# Patient Record
Sex: Male | Born: 1955 | Race: White | Hispanic: No | Marital: Married | Smoking: Never smoker
Health system: Southern US, Community
[De-identification: ages and names within clinical notes are randomized; demographics above are authoritative.]

## PROBLEM LIST (undated history)

## (undated) DIAGNOSIS — F329 Major depressive disorder, single episode, unspecified: Secondary | ICD-10-CM

## (undated) DIAGNOSIS — E1142 Type 2 diabetes mellitus with diabetic polyneuropathy: Secondary | ICD-10-CM

## (undated) DIAGNOSIS — R296 Repeated falls: Secondary | ICD-10-CM

## (undated) DIAGNOSIS — Z9989 Dependence on other enabling machines and devices: Secondary | ICD-10-CM

## (undated) DIAGNOSIS — I8310 Varicose veins of unspecified lower extremity with inflammation: Secondary | ICD-10-CM

## (undated) DIAGNOSIS — Z973 Presence of spectacles and contact lenses: Secondary | ICD-10-CM

## (undated) DIAGNOSIS — E119 Type 2 diabetes mellitus without complications: Secondary | ICD-10-CM

## (undated) DIAGNOSIS — R609 Edema, unspecified: Secondary | ICD-10-CM

## (undated) DIAGNOSIS — R0602 Shortness of breath: Secondary | ICD-10-CM

## (undated) DIAGNOSIS — Z86711 Personal history of pulmonary embolism: Secondary | ICD-10-CM

## (undated) DIAGNOSIS — I878 Other specified disorders of veins: Secondary | ICD-10-CM

## (undated) DIAGNOSIS — G4730 Sleep apnea, unspecified: Secondary | ICD-10-CM

## (undated) DIAGNOSIS — I10 Essential (primary) hypertension: Secondary | ICD-10-CM

## (undated) DIAGNOSIS — G4733 Obstructive sleep apnea (adult) (pediatric): Secondary | ICD-10-CM

## (undated) DIAGNOSIS — F411 Generalized anxiety disorder: Secondary | ICD-10-CM

## (undated) DIAGNOSIS — I82409 Acute embolism and thrombosis of unspecified deep veins of unspecified lower extremity: Secondary | ICD-10-CM

## (undated) DIAGNOSIS — E1149 Type 2 diabetes mellitus with other diabetic neurological complication: Secondary | ICD-10-CM

## (undated) DIAGNOSIS — E7800 Pure hypercholesterolemia, unspecified: Secondary | ICD-10-CM

## (undated) DIAGNOSIS — Z862 Personal history of diseases of the blood and blood-forming organs and certain disorders involving the immune mechanism: Secondary | ICD-10-CM

## (undated) DIAGNOSIS — I739 Peripheral vascular disease, unspecified: Secondary | ICD-10-CM

## (undated) DIAGNOSIS — T8149XA Infection following a procedure, other surgical site, initial encounter: Secondary | ICD-10-CM

## (undated) DIAGNOSIS — E1165 Type 2 diabetes mellitus with hyperglycemia: Secondary | ICD-10-CM

## (undated) DIAGNOSIS — F32A Depression, unspecified: Secondary | ICD-10-CM

## (undated) DIAGNOSIS — K219 Gastro-esophageal reflux disease without esophagitis: Secondary | ICD-10-CM

## (undated) DIAGNOSIS — IMO0001 Reserved for inherently not codable concepts without codable children: Secondary | ICD-10-CM

## (undated) HISTORY — DX: Peripheral vascular disease, unspecified: I73.9

## (undated) HISTORY — DX: Reserved for inherently not codable concepts without codable children: IMO0001

## (undated) HISTORY — DX: Type 2 diabetes mellitus without complications: E11.9

## (undated) HISTORY — DX: Generalized anxiety disorder: F41.1

## (undated) HISTORY — DX: Essential (primary) hypertension: I10

## (undated) HISTORY — PX: OTHER SURGICAL HISTORY: SHX169

## (undated) HISTORY — DX: Obstructive sleep apnea (adult) (pediatric): G47.33

## (undated) HISTORY — DX: Personal history of pulmonary embolism: Z86.711

## (undated) HISTORY — DX: Edema, unspecified: R60.9

## (undated) HISTORY — DX: Type 2 diabetes mellitus with other diabetic neurological complication: E11.49

## (undated) HISTORY — DX: Depression, unspecified: F32.A

## (undated) HISTORY — PX: NO PAST SURGERIES: SHX2092

## (undated) HISTORY — DX: Morbid (severe) obesity due to excess calories: E66.01

## (undated) HISTORY — DX: Type 2 diabetes mellitus with diabetic polyneuropathy: E11.42

## (undated) HISTORY — DX: Gastro-esophageal reflux disease without esophagitis: K21.9

## (undated) HISTORY — DX: Pure hypercholesterolemia, unspecified: E78.00

## (undated) HISTORY — DX: Type 2 diabetes mellitus with hyperglycemia: E11.65

## (undated) HISTORY — DX: Sleep apnea, unspecified: G47.30

## (undated) HISTORY — DX: Varicose veins of unspecified lower extremity with inflammation: I83.10

## (undated) HISTORY — DX: Major depressive disorder, single episode, unspecified: F32.9

---

## 2001-12-25 ENCOUNTER — Ambulatory Visit (HOSPITAL_BASED_OUTPATIENT_CLINIC_OR_DEPARTMENT_OTHER): Admission: RE | Admit: 2001-12-25 | Discharge: 2001-12-25 | Payer: Self-pay | Admitting: *Deleted

## 2002-09-27 DIAGNOSIS — Z86711 Personal history of pulmonary embolism: Secondary | ICD-10-CM

## 2002-09-27 DIAGNOSIS — I82409 Acute embolism and thrombosis of unspecified deep veins of unspecified lower extremity: Secondary | ICD-10-CM

## 2002-09-27 HISTORY — DX: Personal history of pulmonary embolism: Z86.711

## 2002-09-27 HISTORY — DX: Acute embolism and thrombosis of unspecified deep veins of unspecified lower extremity: I82.409

## 2002-12-10 ENCOUNTER — Encounter: Payer: Self-pay | Admitting: *Deleted

## 2002-12-10 ENCOUNTER — Ambulatory Visit (HOSPITAL_COMMUNITY): Admission: RE | Admit: 2002-12-10 | Discharge: 2002-12-10 | Payer: Self-pay | Admitting: *Deleted

## 2003-01-02 ENCOUNTER — Encounter: Payer: Self-pay | Admitting: Cardiology

## 2003-01-02 ENCOUNTER — Inpatient Hospital Stay (HOSPITAL_COMMUNITY): Admission: EM | Admit: 2003-01-02 | Discharge: 2003-01-10 | Payer: Self-pay | Admitting: Emergency Medicine

## 2003-01-03 ENCOUNTER — Encounter (INDEPENDENT_AMBULATORY_CARE_PROVIDER_SITE_OTHER): Payer: Self-pay | Admitting: Cardiology

## 2008-02-18 ENCOUNTER — Ambulatory Visit: Payer: Self-pay | Admitting: Nephrology

## 2008-02-18 ENCOUNTER — Inpatient Hospital Stay (HOSPITAL_COMMUNITY): Admission: EM | Admit: 2008-02-18 | Discharge: 2008-02-20 | Payer: Self-pay | Admitting: Emergency Medicine

## 2008-07-04 ENCOUNTER — Other Ambulatory Visit: Payer: Self-pay | Admitting: Emergency Medicine

## 2008-07-05 ENCOUNTER — Other Ambulatory Visit: Payer: Self-pay | Admitting: Emergency Medicine

## 2008-07-05 ENCOUNTER — Inpatient Hospital Stay (HOSPITAL_COMMUNITY): Admission: EM | Admit: 2008-07-05 | Discharge: 2008-07-07 | Payer: Self-pay | Admitting: Internal Medicine

## 2009-09-29 ENCOUNTER — Emergency Department (HOSPITAL_COMMUNITY): Admission: EM | Admit: 2009-09-29 | Discharge: 2009-09-29 | Payer: Self-pay | Admitting: Emergency Medicine

## 2010-12-13 LAB — DIFFERENTIAL
Basophils Absolute: 0.1 10*3/uL (ref 0.0–0.1)
Basophils Relative: 1 % (ref 0–1)
Eosinophils Absolute: 0.2 10*3/uL (ref 0.0–0.7)
Eosinophils Relative: 3 % (ref 0–5)
Lymphocytes Relative: 27 % (ref 12–46)
Lymphs Abs: 2.2 10*3/uL (ref 0.7–4.0)
Monocytes Absolute: 0.5 10*3/uL (ref 0.1–1.0)
Monocytes Relative: 6 % (ref 3–12)
Neutro Abs: 5.1 10*3/uL (ref 1.7–7.7)
Neutrophils Relative %: 63 % (ref 43–77)

## 2010-12-13 LAB — POCT I-STAT, CHEM 8
BUN: 25 mg/dL — ABNORMAL HIGH (ref 6–23)
Calcium, Ion: 1.09 mmol/L — ABNORMAL LOW (ref 1.12–1.32)
Chloride: 105 mEq/L (ref 96–112)
Creatinine, Ser: 1 mg/dL (ref 0.4–1.5)
Glucose, Bld: 153 mg/dL — ABNORMAL HIGH (ref 70–99)
HCT: 46 % (ref 39.0–52.0)
Hemoglobin: 15.6 g/dL (ref 13.0–17.0)
Potassium: 4.6 mEq/L (ref 3.5–5.1)
Sodium: 138 meq/L (ref 135–145)
TCO2: 30 mmol/L (ref 0–100)

## 2010-12-13 LAB — CBC
HCT: 42.5 % (ref 39.0–52.0)
Hemoglobin: 14.2 g/dL (ref 13.0–17.0)
MCHC: 33.4 g/dL (ref 30.0–36.0)
MCV: 85.6 fL (ref 78.0–100.0)
Platelets: 301 10*3/uL (ref 150–400)
RBC: 4.96 MIL/uL (ref 4.22–5.81)
RDW: 14.4 % (ref 11.5–15.5)
WBC: 8.2 10*3/uL (ref 4.0–10.5)

## 2011-02-09 NOTE — H&P (Signed)
NAME:  Joseph [name], Joseph [name] NO.:  [id number]   MEDICAL RECORD NO.:  [id number]          PATIENT TYPE:  INP   LOCATION:  3036                         FACILITY:  [facility]   PHYSICIAN:  Farris Has, MDDATE OF BIRTH:  May 15, 1956   DATE OF ADMISSION:  07/05/2008  DATE OF DISCHARGE:                              HISTORY & PHYSICAL   PRIMARY CARE Seleni Meller:  Dr. Donald Prose, [place].   CHIEF COMPLAINT:  Swelling of lower abdomen, redness, low-grade fever.   The patient is a 55 year old morbidly obese gentleman with history of  diabetes, hyperlipidemia and recurrent panniculitis, who comes in today  with history of low-grade fever up to 100.8 at home, recent mild cough,  swelling of his pannus with some redness and warmth to it, which made  him concerned that he has a potential panniculitis recurrent, as well as  mild diarrhea.   Otherwise, review of systems unremarkable.  No chest pain, no shortness  of breath.  He has been taking all his medications as prescribed.  No  nausea or vomiting.  He had maybe one loose stool, but otherwise  unremarkable.  Review of systems otherwise negative.   PAST MEDICAL HISTORY:  1. Recurrent panniculitis  2. Diabetes.  3. Hypertension.  4. Hyperlipidemia.  5. 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  6. 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  7. Chronic anasarca.  8. 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   FAMILY HISTORY:  History of a CVA in the family.   SOCIAL HISTORY:  The patient lives in [place], married, has one child  and one granddaughter who lives with them. Does not drink alcohol or use  drugs, never smoked.   ALLERGIES:  NO KNOWN DRUG ALLERGIES.   MEDICATIONS:  1. Humalog sliding scale.  2. Lantus 60 units subcu at night.  3. Metformin 1 gm p.o. b.i.d.  4. Actos 45 mg once a day.  5. Coumadin 10 mg on Mondays and Fridays and 7.5 on all other days.  6.  Quinapril 20 mg 2 tablets in a.m. and one at night.  7. Indapamide 2.5 mg in the morning.  8. Amlodipine 5 mg p.o. daily.  9. Pravastatin 40 mg p.o. daily.   PHYSICAL EXAMINATION:  VITAL SIGNS:  Temperature 98.2, blood pressure  166/57, pulse 98, respirations 20.  Sating 95% on room air.  GENERAL:  The patient is an obese male in no acute distress, sitting  down in a chair.  HEAD:  Nontraumatic.  NECK:  Obese but supple.  No lymphadenopathy noted.  ABDOMEN:  Severely obese with a very large pannus with lymphedema  present, some redness and warmth present as well.  LOWER EXTREMITIES:  Without clubbing, cyanosis or edema.  HEART:  Regular rate and rhythm.  No murmurs, rubs or gallops.  Breath  sounds very distant.  Heart sounds very distant as well. exam is  difficult secondary to morbid obesity.  NEUROLOGICAL:  Able to move about.  Strength 5/5 in all four  extremities.  Cranial nerves  II-XII intact.   LABORATORY DATA:  White blood cell count elevated at 11.2.  Hemoglobin  13.3, sodium 138, potassium 4.4, creatinine 1.4, which is around his  baseline, maybe a little bit up.  INR 3.2.  Otherwise, no imaging was  obtained.   ASSESSMENT/PLAN:  This is a 55 year old gentleman with recurrent  panniculitis. low grade fever and white blood cell count elevation  secondary to panniculitis as well.   1. Panniculitis.  Will as the last time try vancomycin and see if he      has some improvement.  Will follow white blood cell count and      follow him clinically.  2. Mild cough.  Will check a chest x-ray as this patient does have      elevated white blood cell count and mild fever.   We will also check a urinalysis and urine cultures to rule out that as a  source of infection as well.   1. History of pulmonary embolus.  Will continue Coumadin, but hold      tonight's dose as the patient is supratherapeutic.  The goal per      patient is 2.5-3.  2. Hypertension.  Continue home medications,  except for indapamide.      The patient has a relatively slightly elevated creatinine and will      give gentle hydration.  3. Diabetes.  Will do sliding scale insulin, Actos and home      medications.  Except for holding metformin while hospitalized.  4. Creatinine 1.4, unsure if this is actually of any significance.      Will give gentle hydration and see if there is a change in      medications.  This could be normal for this gentleman.  5. Prophylaxis.  Protonix and Coumadin.      Farris Has, MD  Electronically Signed     AVD/MEDQ  D:  07/05/2008  T:  07/05/2008  Job:  [id number]   cc:   Donald Prose, M.D.  Fax: [phone]

## 2011-02-09 NOTE — H&P (Signed)
NAME:  Joseph [name], DIVEN.:  [id number]   MEDICAL RECORD NO.:  [id number]          PATIENT TYPE:  INP   LOCATION:  0103                         [facility]AC[facility]   PHYSICIAN:  Bartholomew Boards, MD      DATE OF BIRTH:  [birth year]   DATE OF ADMISSION:  02/18/2008  DATE OF DISCHARGE:                              HISTORY & PHYSICAL   ADMITTING SERVICE:  Lorina Rabon, [facility].   PRIMARY CARE PHYSICIAN:  Dr. [facility].   CHIEF COMPLAINT:  Fever and chills and skin irritation on the abdomen.   HISTORY OF PRESENT ILLNESS:  This is a 55 year old white male with  history of morbid obesity, diabetes requiring insulin, hypertension, and  recurrent panniculitis who presents with less than 1 day history of  fever, chills, nausea, malaise, and abdominal wall pain. The patient  reports that he awoke this morning feeling fatigued and malaised. He  developed nausea, fever and chills at mid-day and while resting had  shaking chills and rigors, so he presented to the emergency department  as these were accompanied by abdominal wall pain. The patient reports  that he has had these symptoms before during prior episodes of  panniculitis which is infection of the skin of his penis. He reports he  had been treated several times, most recently 1 month ago, with 2  unknown antibiotic injections in the muscle and an oral antibiotic that  he cannot remember as well, possibly Augmentin. He reports that this  resolved with no problem; however, this time he feels much worse than he  has in the past. He denies productive cough, diarrhea, frank abdominal  pain anteriorly, dysuria or hematuria. He does report that he gets short  of breath with exertion which he believes is pretty stable and due to  his weight. He denies chest pain, extremity pain, or new swelling in any  extremity.   REVIEW OF SYSTEMS:  As per his history of present illness. A 14-point  review of systems  was obtained and negative.   PAST MEDICAL HISTORY:  1. Diabetes mellitus requiring insulin.  2. Hypertension.  3. Hyperlipidemia.  4. History of pulmonary embolism and deep venous thrombosis on chronic      Coumadin therapy.  5. Recurrent panniculitis.  6. Chronic venous stasis dermatitis on bilateral lower extremities.   FAMILY HISTORY:  Positive for cerebrovascular accident, negative for  diabetes.   SOCIAL HISTORY:  The patient lives in [place], [place]. He is  unemployed currently. He is married and has one child. Denies tobacco,  alcohol, or drug use.   ALLERGIES:  No known drug allergies.   MEDICATIONS:  1. Humalog sliding-scale.  2. Lantus 60 units, subcu at night.  3. Metformin 1 gram b.i.d.  4. Actos once a day.  5. Coumadin 10 mg on Mondays and Wednesdays and 7.5 mg on the other      days.  6. Amantadine, unknown dose.  7. Pravastatin, unknown dose.  8. Quinapril, unknown dose.   PHYSICAL EXAMINATION:  VITAL SIGNS:  Blood pressure is  109/74.  Temperature 101.9 degrees Fahrenheit. Pulse 110, respirations 20,  satting 96% on room air.  GENERAL:  Morbidly obese white male in no acute distress.  HEENT:  The patient wears glasses. Extraocular movements are intact.  Pupils equally round and reactive to light. Oropharynx is clear.  NECK:  Supple without lymphadenopathy or jugular venous distention.  CARDIOVASCULAR:  Regular rate and rhythm without murmur, rub, or gallop.  PULMONARY:  Clear to auscultation without wheezes, rales, or rhonchi.  ABDOMEN:  Morbidly obese with chronic skin changes due to skin tension  of his extremely large pannus. He has mild erythema appearing as pink  erythema. It is approximately 2 to 2-1/2 feet wide and 1-1/2 feet high  and approximately football-shaped overlying the terminal margin of his  pannus. There does not appear to be any skin breakdown or boils or other  lesions. There does not appear to be any breakdown in the  intertriginous  regions anywhere else along the pannus.  EXTREMITIES:  Bilateral chronic venous stasis dermatitis of the lower  extremities extending to the upper shins with 1+ edema on the bilateral  shins.  SKIN:  See above exam descriptions.  NEUROLOGIC:  Intact.   LABS AND X-RAYS:  White blood count elevated at 19. Urinalysis was  normal. Basic metabolic panel showed elevated glucose at 171; otherwise  was within normal limits. PT/INR was elevated with INR of 3.8.   IMPRESSION AND PLAN:  This is an insulin-dependent diabetic male with  morbid obesity and history of hypertension and deep venous thrombosis on  chronic Coumadin as well as recurrent panniculitis who presents with  recurrent panniculitis complicated by systemic inflammatory response  syndrome as marked by borderline hypotension, tachycardia, and fever.  Plan of action: We will admit the patient to [facility], will start on IV vancomycin with a pharmacy consult to dose  vancomycin in this morbidly obese gentleman, and blood cultures have  been drawn in the emergency department and are pending. We will continue  sliding-scale insulin and Lantus for diabetic control. We will hold oral  hypoglycemics.  For his history of hypertension, he is borderline  hypotensive today so we will hold his anti-hypertensive medicines. For  his history of deep venous thrombosis and chronic anticoagulation, he is  a little supratherapeutic so we will start him on 7.5 mg of Coumadin  daily throughout his hospital stay and check daily INRs.      Bartholomew Boards, MD  Electronically Signed     EWR/MEDQ  D:  02/18/2008  T:  02/18/2008  Job:  [id number]

## 2011-02-09 NOTE — Discharge Summary (Signed)
NAME:  Joseph, [name] NO.:  [id number]   MEDICAL RECORD NO.:  [id number]          PATIENT TYPE:  INP   LOCATION:  3036                         FACILITY:  [facility]   PHYSICIAN:  Corinna L. Conley Canal, MDDATE OF BIRTH:  October 11, 1955   DATE OF ADMISSION:  07/05/2008  DATE OF DISCHARGE:  07/07/2008                               DISCHARGE SUMMARY   DISCHARGE DIAGNOSES:  1. Panniculitis.  2. Moderate obesity.  3. Diabetes.  4. Obstructive sleep apnea.  5. Hyperlipidemia.  6. Hypertension.  7. History of pulmonary embolus and deep venous thrombosis, on chronic      Coumadin.  8. Chronic venous stasis of the legs.   DISCHARGE MEDICATIONS:  Doxycycline 100 mg p.o. b.i.d. until gone,  Augmentin 875 mg p.o. b.i.d. until gone, and decrease his Coumadin to 5  mg nightly until followup with Dr. Nancy Fetter.  He may resume the rest of his  medications which include,  1. Metformin 1000 mg p.o. b.i.d.  2. Actos 45 mg a day.  3. Quinapril 40 mg in the morning and 20 mg in the evening.  4. Indapamide 5 mg daily.  5. Amlodipine 5 mg a day.  6. Pravastatin 40 mg a day.  7. Lantus 60 units subcutaneously nightly.  8. Humalog sliding scale.   CONDITION:  Stable.   ACTIVITY:  He may return to work on Tuesday.   FOLLOWUP:  Follow up with Dr. Donald Prose in a week.  Please check INR.   CONSULTATIONS:  None.   PROCEDURES:  None.   LABORATORY DATA:  White blood cell count on admission was 11,000 with  79% neutrophils, otherwise unremarkable.  His white count normalized.  On admission, INR was 3.2 and peaked at 4.0 off Coumadin.  INR on day of  discharge was 2.4.  Basic metabolic panel on admission significant for a  BUN of 32 and creatinine 1.4.  At discharge, his BUN was 13 and  creatinine 0.9.  LFTs unremarkable.  Hemoglobin A1c 8.1, LDL 81, and HDL  36.  Urinalysis negative.  Blood cultures negative.  Chest x-ray showed  mild basilar pulmonary opacity favor atelectasis.   HISTORY AND  HOSPITAL COURSE:  Joseph [name] is a pleasant 55 year old white  male with multiple medical problems including diabetes and recurrent  panniculitis.  He presented with fever and redness and swelling of his  pannus.  He also had some mild diarrhea.  He was admitted to the floor  and started on IV antibiotics.  His erythema and edema improved.  His  fever resolved.  His leukocytosis resolved and at the time of discharge,  he reports that his pannus looks about at its baseline.  His other  medical problems remained stable with the exception of a few  hypoglycemic episodes.      Corinna L. Conley Canal, MD  Electronically Signed     CLS/MEDQ  D:  07/07/2008  T:  07/07/2008  Job:  [id number]   cc:   Donald Prose, M.D.

## 2011-02-12 NOTE — Discharge Summary (Signed)
NAME:  Joseph [name], Joseph [name] NO.:  [id number]   MEDICAL RECORD NO.:  [id number]                   PATIENT TYPE:  INP   LOCATION:  [id number]                                 FACILITY:  [facility]   PHYSICIAN:  Kerry Hough., M.D.         DATE OF BIRTH:  1956-06-09   DATE OF ADMISSION:  01/02/2003  DATE OF DISCHARGE:  01/10/2003                                 DISCHARGE SUMMARY   FINAL DIAGNOSES:  1. Acute pulmonary embolus.  2. Deep vein thrombosis of left leg.  3. Type 2 diabetes mellitus.  4. Sleep apnea.  5. Hypertensive heart disease.  6. Esophageal reflux disease.  7. Hyperlipidemia.  8. Severe morbid obesity.   PROCEDURES:  1. Venous Doppler of the legs.  2. Ventilation profusion lung scan.   HISTORY:  A 55 year old male who has a prior history of diabetes for 20  years, hypertension and severe morbid obesity weighing in excess of 400  pounds.  He has obstructive sleep apnea.  He had progressive dyspnea on  exertion over the three weeks prior to admission, initially treated with  reflux treatment, also noted to be hypertensive.  He continued to complain  of increasing weakness and shortness of breath and was seen urgently at the  office the day of admission, where he was so weak that he could not walk  down the hall or even bend over to tie his shoes.  He was sent to the  emergency room, where he was found to weight 407 pounds and had a  ventilation profusion lung scan showing evidence of bilateral pulmonary  emboli.  He was too large to do a CT scan of the chest.  He was brought into  the hospital for treatment of acute pulmonary embolus which was bilateral.   Please see the previous history and physical for the remainder of the  details.   LABORATORY DATA:  On admission showed a white count of 12,300; hemoglobin  13; hematocrit 42.1; platelet count 313,000.  Arterial blood gas showed a pH  of 7.44, pCO2 32, and a pO2 of 50.  Prothrombin time  INR was 1.2 on  admission.  D-dimer was 1.33.  Sodium was 134, potassium 4.3, chloride 103,  glucose 273, BUN 22, and creatinine 1.5 on admission.  CPK was normal.  Troponin I was 0.05.  Urinalysis showed some mild glucose.  No protein.  TSH  was 3.403.   HOSPITAL COURSE:  The patient was admitted to the hospital for treatment of  acute bilateral pulmonary emboli.  There were large segmental wedge shaped  effusion defects noted in the right upper lobe, right and lateral lung base,  left upper lobe, lingula and left lower lobe.  He was started initially on  therapy with Lovenox.  An echocardiogram showed an ejection fraction of 55  to 65%.  Normal LV function.  Right ventricle was mildly dilated.  He  was  observed on telemetry and had no arrhythmias.  He was initiated on treatment  with Lovenox and received this and then was started on Coumadin and required  fairly high doses of Coumadin to achieve a therapeutic INR of 2.5.  He was  continued on Lovenox for another 24 hours following this and then was  discharged with a discharged INR of 2.7.  His diabetic control came under  good control.  The importance of following a reduction diet was discussed  with him.  He is discharged at this time in improved condition on Coumadin  12.5 mg daily, Accupril 40 mg in the morning, Actos 45 mg daily, Tiazac 240  mg daily, Glucophage 1000 mg b.i.d., Humalog Insulin per sliding scale and  Depamide 2.5 mg daily, Lantus Insulin 50 q.h.s., Protonix 40 mg daily.  It  is recommended that he stay out of work for at least another week and he  will be seen Monday for a prothrombin time follow up to ensure what his INR  was.  He was instructed on Coumadin and in bleeding precautions.  He is  discharged at this time in improved condition and we will see him in follow  up in one week.                                               Kerry Hough., M.D.    WST/MEDQ  D:  01/10/2003  T:  01/10/2003  Job:   [id number]   cc:   Ples Specter. Henrene Pastor, M.D.  [place] [address]  [place] [phone]  Fax: [phone]

## 2011-06-23 LAB — POCT I-STAT, CHEM 8
BUN: 33 — ABNORMAL HIGH
Calcium, Ion: 1.15
Creatinine, Ser: 1.4
TCO2: 26

## 2011-06-23 LAB — BASIC METABOLIC PANEL
CO2: 26
Calcium: 8.5
Creatinine, Ser: 1.2
GFR calc Af Amer: >60
Glucose, Bld: 225 — ABNORMAL HIGH

## 2011-06-23 LAB — DIFFERENTIAL
Basophils Relative: 0
Basophils Relative: 1
Eosinophils Absolute: 0.4
Eosinophils Relative: 1
Monocytes Absolute: 0.6
Monocytes Relative: 3
Monocytes Relative: 9
Neutro Abs: 17.2 — ABNORMAL HIGH
Neutrophils Relative %: 57

## 2011-06-23 LAB — CBC
HCT: 39.6
Hemoglobin: 13.3
MCHC: 33.7
MCHC: 34.3
MCHC: 34.5
MCV: 85.1
MCV: 85.1
Platelets: 206
RBC: 4.65
RDW: 14.3

## 2011-06-23 LAB — URINALYSIS, ROUTINE W REFLEX MICROSCOPIC
Bilirubin Urine: NEGATIVE
Glucose, UA: 100 — AB
Hgb urine dipstick: NEGATIVE
Nitrite: NEGATIVE
Specific Gravity, Urine: 1.017
pH: 6

## 2011-06-23 LAB — HEMOGLOBIN A1C
Hgb A1c MFr Bld: 8.5 — ABNORMAL HIGH
Mean Plasma Glucose: 225

## 2011-06-23 LAB — CULTURE, BLOOD (ROUTINE X 2)

## 2011-06-28 LAB — BASIC METABOLIC PANEL
BUN: 13
CO2: 29
CO2: 29
Chloride: 106
Creatinine, Ser: 0.9
GFR calc Af Amer: >60
GFR calc non Af Amer: >60
Glucose, Bld: 227 — ABNORMAL HIGH
Glucose, Bld: 77
Potassium: 4.1
Potassium: 4.7
Sodium: 139

## 2011-06-28 LAB — LIPID PANEL
Cholesterol: 133
Total CHOL/HDL Ratio: 3.7
VLDL: 16

## 2011-06-28 LAB — GLUCOSE, CAPILLARY
Glucose-Capillary: 113 — ABNORMAL HIGH
Glucose-Capillary: 206 — ABNORMAL HIGH
Glucose-Capillary: 264 — ABNORMAL HIGH
Glucose-Capillary: 316 — ABNORMAL HIGH
Glucose-Capillary: 43 — ABNORMAL LOW
Glucose-Capillary: 112 — ABNORMAL HIGH
Glucose-Capillary: 223 — ABNORMAL HIGH
Glucose-Capillary: 40 — ABNORMAL LOW

## 2011-06-28 LAB — CBC
HCT: 36.1 — ABNORMAL LOW
Hemoglobin: 11.9 — ABNORMAL LOW
Hemoglobin: 12.8 — ABNORMAL LOW
MCHC: 32.5
MCHC: 32.9
MCV: 87.2
RBC: 4.12 — ABNORMAL LOW
RBC: 4.19 — ABNORMAL LOW
RBC: 4.42
RDW: 15
WBC: 11.2 — ABNORMAL HIGH

## 2011-06-28 LAB — DIFFERENTIAL
Basophils Absolute: 0
Basophils Absolute: 0.1
Basophils Relative: 1
Eosinophils Relative: 4
Lymphocytes Relative: 27
Lymphocytes Relative: 15
Monocytes Absolute: 0.5
Monocytes Absolute: 0.4
Monocytes Relative: 8
Monocytes Relative: 4
Neutro Abs: 3.9
Neutro Abs: 8.9 — ABNORMAL HIGH
Neutrophils Relative %: 79 — ABNORMAL HIGH

## 2011-06-28 LAB — CULTURE, BLOOD (ROUTINE X 2): Culture: NO GROWTH

## 2011-06-28 LAB — POCT I-STAT, CHEM 8
BUN: 32 — ABNORMAL HIGH
Calcium, Ion: 1.07 — ABNORMAL LOW
Chloride: 107
Creatinine, Ser: 1.4
Glucose, Bld: 121 — ABNORMAL HIGH
HCT: 39
Hemoglobin: 13.3
Potassium: 4.4
Sodium: 138
TCO2: 25

## 2011-06-28 LAB — PROTIME-INR
INR: 4 — ABNORMAL HIGH
INR: 3.2 — ABNORMAL HIGH
Prothrombin Time: 27.3 — ABNORMAL HIGH
Prothrombin Time: 42.8 — ABNORMAL HIGH

## 2011-06-28 LAB — COMPREHENSIVE METABOLIC PANEL
AST: 16
BUN: 23
CO2: 26
Calcium: 8.6
Creatinine, Ser: 1.03
GFR calc Af Amer: >60
GFR calc non Af Amer: >60

## 2011-06-28 LAB — URINALYSIS, ROUTINE W REFLEX MICROSCOPIC
Glucose, UA: NEGATIVE
Hgb urine dipstick: NEGATIVE
Ketones, ur: NEGATIVE
Protein, ur: NEGATIVE
Urobilinogen, UA: 1

## 2011-06-28 LAB — HEMOGLOBIN A1C
Hgb A1c MFr Bld: 8.1 — ABNORMAL HIGH
Mean Plasma Glucose: 186

## 2011-06-28 LAB — URINE CULTURE: Culture: NO GROWTH

## 2011-07-17 ENCOUNTER — Observation Stay (HOSPITAL_COMMUNITY)
Admission: EM | Admit: 2011-07-17 | Discharge: 2011-07-18 | Disposition: A | Discharge status: Home / Self Care | Payer: Medicare Other | Attending: Emergency Medicine | Admitting: Emergency Medicine

## 2011-07-17 DIAGNOSIS — E119 Type 2 diabetes mellitus without complications: Secondary | ICD-10-CM | POA: Insufficient documentation

## 2011-07-17 DIAGNOSIS — R509 Fever, unspecified: Secondary | ICD-10-CM | POA: Insufficient documentation

## 2011-07-17 DIAGNOSIS — I10 Essential (primary) hypertension: Secondary | ICD-10-CM | POA: Insufficient documentation

## 2011-07-17 DIAGNOSIS — L02219 Cutaneous abscess of trunk, unspecified: Principal | ICD-10-CM | POA: Insufficient documentation

## 2011-07-17 DIAGNOSIS — Z86718 Personal history of other venous thrombosis and embolism: Secondary | ICD-10-CM | POA: Insufficient documentation

## 2011-07-18 LAB — CBC
MCH: 27.9 pg (ref 26.0–34.0)
MCHC: 32.7 g/dL (ref 30.0–36.0)
MCV: 85.3 fL (ref 78.0–100.0)
Platelets: 249 10*3/uL (ref 150–400)
RBC: 4.63 MIL/uL (ref 4.22–5.81)
RDW: 15.6 % — ABNORMAL HIGH (ref 11.5–15.5)

## 2011-07-18 LAB — DIFFERENTIAL
Eosinophils Absolute: 0 10*3/uL (ref 0.0–0.7)
Eosinophils Relative: 0 % (ref 0–5)
Lymphs Abs: 1.2 10*3/uL (ref 0.7–4.0)
Monocytes Absolute: 0.6 10*3/uL (ref 0.1–1.0)
Monocytes Relative: 3 % (ref 3–12)

## 2011-07-18 LAB — BASIC METABOLIC PANEL
CO2: 25 mEq/L (ref 19–32)
Calcium: 8.9 mg/dL (ref 8.4–10.5)
Glucose, Bld: 144 mg/dL — ABNORMAL HIGH (ref 70–99)
Sodium: 134 mEq/L — ABNORMAL LOW (ref 135–145)

## 2011-07-18 LAB — GLUCOSE, CAPILLARY: Glucose-Capillary: 81 mg/dL (ref 70–99)

## 2011-07-18 LAB — PROCALCITONIN: Procalcitonin: 0.25 ng/mL

## 2011-07-24 LAB — CULTURE, BLOOD (ROUTINE X 2): Culture: NO GROWTH

## 2013-03-15 ENCOUNTER — Ambulatory Visit: Payer: Medicare Other | Attending: Family Medicine | Admitting: Physical Therapy

## 2013-03-15 DIAGNOSIS — R262 Difficulty in walking, not elsewhere classified: Secondary | ICD-10-CM | POA: Insufficient documentation

## 2013-03-15 DIAGNOSIS — IMO0001 Reserved for inherently not codable concepts without codable children: Secondary | ICD-10-CM | POA: Insufficient documentation

## 2013-05-02 ENCOUNTER — Other Ambulatory Visit: Payer: Self-pay | Admitting: General Surgery

## 2013-05-02 ENCOUNTER — Encounter (HOSPITAL_BASED_OUTPATIENT_CLINIC_OR_DEPARTMENT_OTHER): Payer: Medicare Other | Attending: General Surgery

## 2013-05-02 DIAGNOSIS — E119 Type 2 diabetes mellitus without complications: Secondary | ICD-10-CM | POA: Insufficient documentation

## 2013-05-02 DIAGNOSIS — Z794 Long term (current) use of insulin: Secondary | ICD-10-CM | POA: Insufficient documentation

## 2013-05-02 DIAGNOSIS — Z86711 Personal history of pulmonary embolism: Secondary | ICD-10-CM | POA: Insufficient documentation

## 2013-05-02 DIAGNOSIS — I872 Venous insufficiency (chronic) (peripheral): Secondary | ICD-10-CM | POA: Insufficient documentation

## 2013-05-02 DIAGNOSIS — L97809 Non-pressure chronic ulcer of other part of unspecified lower leg with unspecified severity: Secondary | ICD-10-CM | POA: Insufficient documentation

## 2013-05-02 DIAGNOSIS — I10 Essential (primary) hypertension: Secondary | ICD-10-CM | POA: Insufficient documentation

## 2013-05-02 DIAGNOSIS — Z7901 Long term (current) use of anticoagulants: Secondary | ICD-10-CM | POA: Insufficient documentation

## 2013-05-02 DIAGNOSIS — I878 Other specified disorders of veins: Secondary | ICD-10-CM

## 2013-05-03 NOTE — Progress Notes (Signed)
[facility]  NAME:  GARI, TUBRIDY NO.:  [id number]  MEDICAL RECORD NO.:  [id number]      DATE OF BIRTH:  Apr 27, 1956  PHYSICIAN:  Judene Companion, M.D.           VISIT DATE:                                  OFFICE VISIT   Joseph [name] is a 57 year old Caucasian male, who is morbidly obese.  He weighs 500 pounds and he is having problems with venous hypertension and a chronic venous ulcer on his left leg.  These are almost circumferential.  I debrided them of necrotic material with a curette down to the subcu.  He is also diabetic and he has hypertension.  He had a history of a pulmonary embolus and he is on Coumadin.  He also has a history of cataracts.  He takes Lantus insulin 50 units subcutaneous once a day and Humalog on a sliding scale, Coumadin is 5 mg a day.  He also takes Lasix 20 mg a day and Pravachol 40 mg a day.  His blood pressure is 132/72, respirations 16, pulse 98, temperature 98.  He was debrided today and I then treated him with Santyl and a compression dressing and we will see him back in a week.  So his diagnosis is venous hypertension with the venous stasis ulcers left leg.  Other diagnoses are morbid obesity, diabetes, hypertension, history of deep venous thrombosis, and he is on long-term Coumadin therapy.  He will come back in a week.     Judene Companion, M.D.     PP/MEDQ  D:  05/02/2013  T:  05/03/2013  Job:  [id number]

## 2013-05-08 ENCOUNTER — Ambulatory Visit (HOSPITAL_COMMUNITY)
Admission: RE | Admit: 2013-05-08 | Discharge: 2013-05-08 | Disposition: A | Discharge status: Home / Self Care | Payer: Medicare Other | Source: Ambulatory Visit | Attending: Cardiovascular Disease | Admitting: Cardiovascular Disease

## 2013-05-08 DIAGNOSIS — I872 Venous insufficiency (chronic) (peripheral): Secondary | ICD-10-CM | POA: Insufficient documentation

## 2013-05-08 DIAGNOSIS — Z48812 Encounter for surgical aftercare following surgery on the circulatory system: Secondary | ICD-10-CM

## 2013-05-08 DIAGNOSIS — I878 Other specified disorders of veins: Secondary | ICD-10-CM

## 2013-05-08 DIAGNOSIS — M7989 Other specified soft tissue disorders: Secondary | ICD-10-CM

## 2013-05-17 NOTE — Progress Notes (Signed)
[facility]  NAME:  MELAKAI, CORELLA                       ACCOUNT NO.:  MEDICAL RECORD NO.:  [id number]      DATE OF BIRTH:  1956/03/19  PHYSICIAN:  Judene Companion, M.D.      VISIT DATE:  05/16/2013                                  OFFICE VISIT   This is a 57 year old morbidly obese male who is a diabetic and has severe venous hypertension and venous stasis ulcers and bilateral lower extremity edema.  He is on many medicines, mostly for his diabetes and hypertension, and he is also on warfarin for atrial fibrillation. Today, he was weighed and he was 500 pounds.  His blood pressure is 130/80, his pulse 68, temperature 98.6.  I am writing a [employer] for permission for him to obtain home lymphedema pumps as I think this will help his terribly swollen legs, and I just cannot imagine with his weight how anything is going to help him more than lymphedema pumps at home.  I think with these we can get rid of the fluid in his legs and make him able to start exercising more and lose weight and have better control of his diabetes.     Judene Companion, M.D.     PP/MEDQ  D:  05/16/2013  T:  05/17/2013  Job:  [id number]

## 2013-05-30 ENCOUNTER — Encounter (HOSPITAL_BASED_OUTPATIENT_CLINIC_OR_DEPARTMENT_OTHER): Payer: Medicare Other | Attending: General Surgery

## 2013-05-30 DIAGNOSIS — E119 Type 2 diabetes mellitus without complications: Secondary | ICD-10-CM | POA: Insufficient documentation

## 2013-05-30 DIAGNOSIS — I872 Venous insufficiency (chronic) (peripheral): Secondary | ICD-10-CM | POA: Insufficient documentation

## 2013-05-30 DIAGNOSIS — L97809 Non-pressure chronic ulcer of other part of unspecified lower leg with unspecified severity: Secondary | ICD-10-CM | POA: Insufficient documentation

## 2013-05-30 DIAGNOSIS — I10 Essential (primary) hypertension: Secondary | ICD-10-CM | POA: Insufficient documentation

## 2013-05-30 DIAGNOSIS — I890 Lymphedema, not elsewhere classified: Secondary | ICD-10-CM | POA: Insufficient documentation

## 2013-06-16 NOTE — Progress Notes (Signed)
[facility]  NAME:  [name], [name] NO.:  [id number]  MEDICAL RECORD NO.:  [id number]      DATE OF BIRTH:  26-Mar-1956  PHYSICIAN:  Judene Companion, M.D.           VISIT DATE:                                  OFFICE VISIT   Joseph [name] is a patient of mine who I have been taking care of in the [facility] for about 6 months.  He is morbidly obese.  Unfortunately he weighs 500 pounds, although he is attempting desperately to try to lose some of this weight.  He has type 2 diabetes. He also has hypertension and he has marked venous stasis with venous stasis ulcers and marked lymphedema.  Because of this lymphedema which I think would be ameliorated with lymphedema pumps at home, I would like to see if somehow I could get some permission for the use of these pumps at home.  I think with all his edema and his inability to do any sort of exercise that this would be very beneficial to his markedly lymphedematous legs and venous stasis.     Judene Companion, M.D.     PP/MEDQ  D:  06/15/2013  T:  06/16/2013  Job:  [id number]

## 2013-08-04 ENCOUNTER — Emergency Department (HOSPITAL_COMMUNITY): Payer: Medicare Other

## 2013-08-04 ENCOUNTER — Emergency Department (HOSPITAL_COMMUNITY)
Admission: EM | Admit: 2013-08-04 | Discharge: 2013-08-04 | Disposition: A | Discharge status: Home / Self Care | Payer: Medicare Other | Attending: Emergency Medicine | Admitting: Emergency Medicine

## 2013-08-04 DIAGNOSIS — Z7901 Long term (current) use of anticoagulants: Secondary | ICD-10-CM | POA: Insufficient documentation

## 2013-08-04 DIAGNOSIS — IMO0002 Reserved for concepts with insufficient information to code with codable children: Secondary | ICD-10-CM | POA: Insufficient documentation

## 2013-08-04 DIAGNOSIS — S6990XA Unspecified injury of unspecified wrist, hand and finger(s), initial encounter: Secondary | ICD-10-CM | POA: Insufficient documentation

## 2013-08-04 DIAGNOSIS — W050XXA Fall from non-moving wheelchair, initial encounter: Secondary | ICD-10-CM | POA: Insufficient documentation

## 2013-08-04 DIAGNOSIS — Z79899 Other long term (current) drug therapy: Secondary | ICD-10-CM | POA: Insufficient documentation

## 2013-08-04 DIAGNOSIS — S59909A Unspecified injury of unspecified elbow, initial encounter: Secondary | ICD-10-CM | POA: Insufficient documentation

## 2013-08-04 DIAGNOSIS — Y929 Unspecified place or not applicable: Secondary | ICD-10-CM | POA: Insufficient documentation

## 2013-08-04 DIAGNOSIS — Z794 Long term (current) use of insulin: Secondary | ICD-10-CM | POA: Insufficient documentation

## 2013-08-04 DIAGNOSIS — Y9389 Activity, other specified: Secondary | ICD-10-CM | POA: Insufficient documentation

## 2013-08-04 DIAGNOSIS — S91309A Unspecified open wound, unspecified foot, initial encounter: Secondary | ICD-10-CM | POA: Insufficient documentation

## 2013-08-04 MED ORDER — CLINDAMYCIN HCL 150 MG PO CAPS: 300.0000 mg | ORAL_CAPSULE | Freq: Four times a day (QID) | ORAL | Status: DC

## 2013-08-04 NOTE — ED Notes (Signed)
Pt reports he ran himself over with his power chair this morning. Injury to left foot, landed on right hand. Nose bruised. EMS called and saw pt, states he will need sutures to foot. Bleeding controlled, pt takes coumadin

## 2013-08-04 NOTE — ED Provider Notes (Signed)
CSN: [id number]     Arrival date & time 08/04/13  1027 History   First MD Initiated Contact with Patient 08/04/13 1035     Chief Complaint  Patient presents with  . Foot Injury   (Consider location/radiation/quality/duration/timing/severity/associated sxs/prior Treatment) HPI Comments: The patient accidentally ran over his left foot with his power chair this morning, knocking him out of the chair to the ground. He laid on his hands and knees. He reports pain in the area at the base of the right thumb and wrist area. He did hit his head on the ground, complains of an abrasion across the bridge of his nose from his classes, no loss of consciousness. There is no headache. Patient denies neck and back pain. Patient reports a large laceration on the heel of the left foot, initially moderate bleeding, now controlled. Patient does take Coumadin. He just received his tetanus immunization by his doctor.  Patient is a 57 y.o. male presenting with foot injury.  Foot Injury Associated symptoms: no back pain and no neck pain     No past medical history on file. No past surgical history on file. No family history on file. History  Substance Use Topics  . Smoking status: Not on file  . Smokeless tobacco: Not on file  . Alcohol Use: Not on file    Review of Systems  Musculoskeletal: Negative for back pain and neck pain.       Hand/wrist pain Foot pain   Skin: Positive for wound.  All other systems reviewed and are negative.    Allergies  Review of patient's allergies indicates no known allergies.  Home Medications   Current Outpatient Rx  Name  Route  Sig  Dispense  Refill  . amLODipine (NORVASC) 5 MG tablet   Oral   Take 5 mg by mouth daily.         Marland Kitchen FLUoxetine (PROZAC) 40 MG capsule   Oral   Take 40 mg by mouth daily.         . furosemide (LASIX) 20 MG tablet   Oral   Take 20 mg by mouth.         . indapamide (LOZOL) 2.5 MG tablet   Oral   Take 2.5 mg by mouth 2 (two)  times daily.         . insulin glargine (LANTUS) 100 UNIT/ML injection   Subcutaneous   Inject 50 Units into the skin at bedtime.          . insulin lispro (HUMALOG) 100 UNIT/ML injection   Subcutaneous   Inject 15-20 Units into the skin 3 (three) times daily before meals. Per sliding scale         . metFORMIN (GLUCOPHAGE) 1000 MG tablet   Oral   Take 1,000 mg by mouth 2 (two) times daily with a meal.         . OVER THE COUNTER MEDICATION   Oral   Take 1 tablet by mouth 3 (three) times daily. Over the counter antacid from [facility]         . pioglitazone (ACTOS) 45 MG tablet   Oral   Take 45 mg by mouth daily.         . pravastatin (PRAVACHOL) 40 MG tablet   Oral   Take 40 mg by mouth daily.         . quinapril (ACCUPRIL) 20 MG tablet   Oral   Take 20 mg by mouth daily.          Marland Kitchen  warfarin (COUMADIN) 5 MG tablet   Oral   Take 5-7.5 mg by mouth at bedtime. Except Monday and Friday 7.5mg           BP 150/48  Pulse 95  Temp(Src) 97.8 F (36.6 C) (Oral)  Resp 26  SpO2 98% Physical Exam  Constitutional: He is oriented to person, place, and time. He appears well-developed and well-nourished. No distress.  HENT:  Head: Normocephalic and atraumatic.    Right Ear: Hearing normal.  Left Ear: Hearing normal.  Nose: Nose normal. No septal deviation or nasal septal hematoma.  Mouth/Throat: Oropharynx is clear and moist and mucous membranes are normal.  Eyes: Conjunctivae and EOM are normal. Pupils are equal, round, and reactive to light.  Neck: Normal range of motion. Neck supple. No spinous process tenderness and no muscular tenderness present.  Cardiovascular: Regular rhythm, S1 normal and S2 normal.  Exam reveals no gallop and no friction rub.   No murmur heard. Pulmonary/Chest: Effort normal and breath sounds normal. No respiratory distress. He exhibits no tenderness.  Abdominal: Soft. Normal appearance and bowel sounds are normal. There is no  hepatosplenomegaly. There is no tenderness. There is no rebound, no guarding, no tenderness at McBurney's point and negative Murphy's sign. No hernia.  Musculoskeletal: Normal range of motion.       Cervical back: Normal.       Thoracic back: Normal.       Lumbar back: Normal.       Right hand: He exhibits tenderness. He exhibits normal range of motion and no deformity.       Hands:      Left foot: He exhibits laceration. He exhibits normal range of motion and normal capillary refill.       Feet:  Neurological: He is alert and oriented to person, place, and time. He has normal strength. No cranial nerve deficit or sensory deficit. Coordination normal. GCS eye subscore is 4. GCS verbal subscore is 5. GCS motor subscore is 6.  Skin: Skin is warm, dry and intact. No rash noted. No cyanosis.     Psychiatric: He has a normal mood and affect. His speech is normal and behavior is normal. Thought content normal.    ED Course  Procedures (including critical care time)  LACERATION REPAIR Performed by: Orpah Greek. Authorized by: Orpah Greek Consent: Verbal consent obtained. Risks and benefits: risks, benefits and alternatives were discussed Consent given by: patient Patient identity confirmed: provided demographic data Prepped and Draped in normal sterile fashion Wound explored  Laceration Location: foot   Laceration Length: 10cm  No Foreign Bodies seen or palpated  Anesthesia: local infiltration  Local anesthetic: lidocaine 1% without epinephrine  Anesthetic total: 20 ml  Irrigation method: syringe Amount of cleaning: copious - 1 liter of irrigation  Skin closure: sutures  Number of sutures: 20  Technique: simple inturrupted  Patient tolerance: Patient tolerated the procedure well with no immediate complications.  Labs Review Labs Reviewed - No data to display Imaging Review Dg Wrist Complete Right  08/04/2013   CLINICAL DATA:  Fall with right  wrist and thumb pain.  EXAM: RIGHT WRIST - COMPLETE 3+ VIEW  COMPARISON:  None.  FINDINGS: There is no evidence of fracture or dislocation. There is no evidence of arthropathy or other focal bone abnormality. Soft tissues are notable for small vessel atherosclerotic disease.  IMPRESSION: No acute findings.   Electronically Signed   By: Marin Olp M.D.   On: 08/04/2013 11:19  Dg Hand Complete Right  08/04/2013   CLINICAL DATA:  Fall with right wrist and thumb pain.  EXAM: RIGHT HAND - COMPLETE 3+ VIEW  COMPARISON:  None.  FINDINGS: There is no evidence of fracture or dislocation. There is no evidence of arthropathy or other focal bone abnormality. Soft tissues are unremarkable.  IMPRESSION: Negative.   Electronically Signed   By: Marin Olp M.D.   On: 08/04/2013 11:18   Dg Foot Complete Left  08/04/2013   CLINICAL DATA:  Fall with left heel pain.  EXAM: LEFT FOOT - COMPLETE 3+ VIEW  COMPARISON:  None.  FINDINGS: There are mild degenerative changes over the midfoot and tibiotalar joint. There is no acute fracture or dislocation. Small vessel atherosclerotic disease is present.  IMPRESSION: No acute findings.   Electronically Signed   By: Marin Olp M.D.   On: 08/04/2013 11:21    EKG Interpretation   None       MDM  Diagnosis: LAceration; Contusions  Patient presents to ER after a fall. Patient ran over the left heel with his motorized wheelchair. Patient suffered a large complex laceration to back of the heel. Careful examination, however, reveals that the subcutaneous tissue was intact, the laceration was entirely through the dermis, however. There is no concern for Achilles tendon involvement. There are no vessels or nerves in the area to be concerned about. Patient has normal range of motion. Laceration was repaired with sutures. I did discuss with patient that this would have a tendency for infection to to the complexity of the laceration and his concomitant diabetes. Patient had  extensively cleaned here in the ER and will follow up with wound care, who he has seen before for stasis ulcers. He will be placed on clindamycin empirically.    Orpah Greek, MD 08/05/13 [id number]

## 2013-08-13 ENCOUNTER — Encounter (HOSPITAL_BASED_OUTPATIENT_CLINIC_OR_DEPARTMENT_OTHER): Payer: Medicare Other | Attending: General Surgery

## 2013-08-13 DIAGNOSIS — I890 Lymphedema, not elsewhere classified: Secondary | ICD-10-CM | POA: Insufficient documentation

## 2013-08-13 DIAGNOSIS — I10 Essential (primary) hypertension: Secondary | ICD-10-CM | POA: Insufficient documentation

## 2013-08-13 DIAGNOSIS — Z794 Long term (current) use of insulin: Secondary | ICD-10-CM | POA: Insufficient documentation

## 2013-08-13 DIAGNOSIS — E119 Type 2 diabetes mellitus without complications: Secondary | ICD-10-CM | POA: Insufficient documentation

## 2013-08-13 DIAGNOSIS — I87309 Chronic venous hypertension (idiopathic) without complications of unspecified lower extremity: Secondary | ICD-10-CM | POA: Insufficient documentation

## 2013-08-13 DIAGNOSIS — S91309A Unspecified open wound, unspecified foot, initial encounter: Secondary | ICD-10-CM | POA: Insufficient documentation

## 2013-08-13 DIAGNOSIS — Z79899 Other long term (current) drug therapy: Secondary | ICD-10-CM | POA: Insufficient documentation

## 2013-08-13 DIAGNOSIS — W268XXA Contact with other sharp object(s), not elsewhere classified, initial encounter: Secondary | ICD-10-CM | POA: Insufficient documentation

## 2013-08-13 DIAGNOSIS — Z7901 Long term (current) use of anticoagulants: Secondary | ICD-10-CM | POA: Insufficient documentation

## 2013-08-13 DIAGNOSIS — Z993 Dependence on wheelchair: Secondary | ICD-10-CM | POA: Insufficient documentation

## 2013-08-14 NOTE — Progress Notes (Signed)
[facility]  NAME:  Joseph [name], ARMADA NO.:  [id number]  MEDICAL RECORD NO.:  [id number]      DATE OF BIRTH:  1956/06/17  PHYSICIAN:  Judene Companion, M.D.           VISIT DATE:                                  OFFICE VISIT   This is a 57 year old morbidly obese gentleman who we have been treating here for bilateral venous hypertension and bilateral lymphedema.  He has pumps at home, and he also wears compression hose.  He also has diabetes and hypertension.  MEDICATIONS:  Include warfarin, quinapril, Actos, metformin, Humalog insulin, Lantus insulin, Lasix, fluoxetine, and amlodipine.  He unfortunately cut himself getting out of his wheelchair 9 days ago, and he got 20 stitches on the posterior aspect of his left heel.  Today, when we looked at him, his blood pressure was 178/72, respirations 18, pulse is 93, temperature 98.  He weighs 500 pounds.  He was examined and I felt the wound was healing alright.  He has 20 sutures in there, they are interrupted nylon sutures.  I could feel a dorsalis pedis pulse.  I felt that it was healing adequately, and we are going to put the dressings back on with silver alginate and then put compression because of the severe problems he has with venous hypertension and lymphedema.  PLAN:  I am leaving the stitches at least another 2 weeks.  We will see him in a week.  He is already on prophylactic antibiotics.     Judene Companion, M.D.     PP/MEDQ  D:  08/13/2013  T:  08/14/2013  Job:  [id number]

## 2013-08-20 NOTE — Progress Notes (Signed)
[facility]  NAME:  Joseph [name], Joseph [name] NO.:  [id number]  MEDICAL RECORD NO.:  [id number]      DATE OF BIRTH:  March 29, 1956  PHYSICIAN:  [name], DO       VISIT DATE:  08/20/2013                                  OFFICE VISIT   HISTORY OF PRESENT ILLNESS:  The patient is a 57 year old gentleman who is here for followup evaluation of his left lower extremity heel wound secondary to trauma.  He fell sustaining a large laceration and was seen in the ER.  This was sutured up in the ER, and he was given clindamycin. Since then, the area has started to become a little more swollen and red.  He has finished up the clindamycin.  He has not been elevating it regularly.  He denies any fever or chills.  He has multiple medical problems.  He has been using alginate on the area.  PAST MEDICAL HISTORY:  Positive for chronic venous insufficiency, depression, anxiety, obesity, pulmonary embolism, DVT in the left leg, hypercholesterolemia, hypertension, gastroesophageal reflux disease, chronic stasis dermatitis, and sleep apnea.  He utilizes a CPAP.  PAST SURGICAL HISTORY:  Surgery include stitches of the left heel.  MEDICATIONS:  Include amlodipine, fluoxetine, furosemide, indapamide, Lantus, Humalog, metformin, Actos, pravastatin, quinapril, warfarin, and clindamycin recently.  ALLERGIES:  No known drug allergies.  SOCIAL HISTORY:  Lives at home.  Denies smoking.  PHYSICAL EXAMINATION:  GENERAL:  He is alert, oriented, and cooperative. His breathing is unlabored at present. HEART:  His heart rate is regular. ABDOMEN:  He is extremely obese.  No appreciated organomegaly due to the size. EXTREMITIES:  He has got severe peripheral vascular disease with swelling edema, and lymphedema type of the legs with varicose veins. Pulses are present but weak.  The laceration is at present together, but there is swelling and redness.  PLAN:  Every other suture  was removed.  I am recommending elevation throughout the day as much as possible.  Multivitamin, vitamin C, zinc, and restarting the clindamycin.  Hopefully, we can get this under control, and continue with the silver alginate, and we will see him back in a week.     [name], DO     CS/MEDQ  D:  08/20/2013  T:  08/20/2013  Job:  [id number]

## 2013-08-27 ENCOUNTER — Encounter (HOSPITAL_BASED_OUTPATIENT_CLINIC_OR_DEPARTMENT_OTHER): Payer: Medicare Other | Attending: Plastic Surgery

## 2013-08-27 DIAGNOSIS — L97509 Non-pressure chronic ulcer of other part of unspecified foot with unspecified severity: Secondary | ICD-10-CM | POA: Insufficient documentation

## 2013-08-27 DIAGNOSIS — E119 Type 2 diabetes mellitus without complications: Secondary | ICD-10-CM | POA: Insufficient documentation

## 2013-08-28 NOTE — Progress Notes (Signed)
[facility]  NAME:  Joseph [name], Joseph [name] NO.:  [id number]  MEDICAL RECORD NO.:  [id number]      DATE OF BIRTH:  06-Dec-1955  PHYSICIAN:  [name], DO       VISIT DATE:  08/27/2013                                  OFFICE VISIT   HISTORY OF PRESENT ILLNESS:  The patient is a 57 year old gentleman who is here for followup on his left lower extremity heel trauma.  He has been using triple antibiotic ointment on the area for the past week with some improvement, little bit of decreasing redness.  MEDICATIONS:  No change.  SOCIAL HISTORY:  No change.  REVIEW OF SYSTEMS:  Negative.  PHYSICAL EXAMINATION:  GENERAL:  On exam, he is alert, oriented, cooperative, not in any acute distress.  He is pleasant.  He is morbidly obese.  He is using the compression stockings on both legs, but the pneumatic compression he is not using at all. HEART:  His heart rate is regular. LUNGS:  His breathing is unlabored.  The wound has slightly improved.  The sutures were removed, now it has like a full-thickness distal skin flap; and we are going to use Silvadene, elevation, multivitamin, vitamin C, hold off on the zinc, and see him back in a week.  I have encouraged him to continue with the pneumatic compression on his right leg and hold off on his left.     [name], DO     CS/MEDQ  D:  08/27/2013  T:  08/28/2013  Job:  [id number]

## 2013-09-04 NOTE — Progress Notes (Signed)
[facility]  NAME:  RYLANN, CRISTINA NO.:  [id number]  MEDICAL RECORD NO.:  [id number]      DATE OF BIRTH:  07/11/1956  PHYSICIAN:  [name], DO            VISIT DATE:                                  OFFICE VISIT   The patient is a 57 year old gentleman who is here for followup on his heel ulcer.  He had a traumatic injury from his wheelchair, was seen in the ED.  Sutures were placed.  We took the sutures out last week and he has some improvement.  There is no sign of infection.  No change in his medications or social history.  His wife is with him.  REVIEW OF SYSTEMS:  Otherwise negative.  PHYSICAL EXAMINATION:  GENERAL:  On exam, he is alert, oriented, cooperative, not in any acute distress. ABDOMEN:  Obese. CHEST:  His breathing is unlabored.  The wound was debrided of the left heel.  We will start with silver alginate and hopefully in a week,  we can start with collagen.     [name], DO     CS/MEDQ  D:  09/03/2013  T:  09/04/2013  Job:  [id number]

## 2013-09-11 NOTE — Progress Notes (Signed)
[facility]  NAME:  TERIC, BOGDA NO.:  [id number]  MEDICAL RECORD NO.:  [id number]      DATE OF BIRTH:  May 18, 1956  PHYSICIAN:  [name], DO       VISIT DATE:  09/10/2013                                  OFFICE VISIT   The patient is a 57 year old gentleman who is here for followup on his left heel ulcer secondary to trauma.  He is a diabetic Wagner 2.  He has been using Silvadene on the periwound area.  There has been no change in his medications or social history.  PHYSICAL EXAMINATION:  He is alert, oriented, cooperative, not in any acute distress.  His breathing is unlabored and his heart rate is regular.  The wound has improved.  Debridement was done with the curette and he has very good granulating tissue underneath.  We will switch him to collagen.  Continue with elevation, protein, multivitamin, vitamin C, zinc, compression, and he has not been using his sequential compression at home because he said it hurt a little bit.  Recommended that he do it for half the time if he is able to tolerate it and he has agreed to try.  We will see him back in a week.     [name], DO     CS/MEDQ  D:  09/10/2013  T:  09/11/2013  Job:  [id number]

## 2013-09-17 NOTE — Progress Notes (Signed)
[facility]  NAME:  Joseph [name], Joseph [name] NO.:  [id number]  MEDICAL RECORD NO.:  [id number]      DATE OF BIRTH:  05/21/1956  PHYSICIAN:  [name], DO       VISIT DATE:  09/17/2013                                  OFFICE VISIT   The patient is a 57 year old male who is here for followup on his left foot traumatic injury.  He has multiple medical problems.  He has been placing collagen on the area every couple of days at home.  There has been no change in his medications or social history.  On exam, he is alert, oriented, cooperative, morbidly obese.  Breathing is unlabored and his heart rate is regular.  The wound was debrided and is actually doing quite well with granulating in.  We will add a little bit of Santyl that will help to decrease the fibrous buildup.  Continue with the collagen, soak 15 minutes a day and Dial soap, and see him back in a week.     [name], DO     CS/MEDQ  D:  09/17/2013  T:  09/17/2013  Job:  [id number]

## 2013-10-01 ENCOUNTER — Encounter (HOSPITAL_BASED_OUTPATIENT_CLINIC_OR_DEPARTMENT_OTHER): Payer: Medicare Other | Attending: Plastic Surgery

## 2013-10-01 DIAGNOSIS — L97409 Non-pressure chronic ulcer of unspecified heel and midfoot with unspecified severity: Secondary | ICD-10-CM | POA: Insufficient documentation

## 2013-10-02 NOTE — Progress Notes (Signed)
[facility]  NAME:  Joseph [name], Joseph [name] NO.:  [id number]  MEDICAL RECORD NO.:  [id number]      DATE OF BIRTH:  [birth year]  PHYSICIAN:  [name], DO       VISIT DATE:  10/01/2013                                  OFFICE VISIT   HISTORY:  The patient is a 58 year old gentleman who is here for a followup on his left heel ulcer.  He is doing extremely well with marked improvement in the overall area.  He has been using the Santyl and collagen on it.  He is not in any acute distress.  REVIEW OF SYSTEMS:  Otherwise negative.  No change in medications or social history.  He still has some family support.  PHYSICAL EXAMINATION:  GENERAL:  On exam, he is alert, oriented, cooperative, not in acute distress.  He is pleasant. HEENT:  Pupils are equal.  Extraocular muscles are intact. NECK:  No cervical lymphadenopathy. CHEST:  Breathing is unlabored. HEART:  Rate is regular. ABDOMEN:  Extremely large, but soft.  PLAN:  The wound has marked improvement in the overall size, depth, and appearance.  It does not appear to be infected.  Continue with Santyl and collagen and see him back in 1 week.     [name], DO     CS/MEDQ  D:  10/01/2013  T:  10/02/2013  Job:  [id number]

## 2013-10-08 NOTE — Progress Notes (Signed)
[facility]  NAME:  SAYAN, GIESER NO.:  [id number]  MEDICAL RECORD NO.:  [id number]      DATE OF BIRTH:  30-Jul-1956  PHYSICIAN:  [name], DO       VISIT DATE:  10/08/2013                                  OFFICE VISIT   HISTORY OF PRESENT ILLNESS:  The patient is a 58 year old male who is here for followup on his left heel ulcer.  He is doing extremely well. He has been using Santyl on it over the week.  It is improving in its overall appearance, size and depth.  This is extremely encouraging. Recommend that he continue his current treatment regimen.  There is no change in his medications or social history.  He has got a very supportive wife.  REVIEW OF SYSTEMS:  Negative.  PHYSICAL EXAMINATION:  On exam, he is alert, oriented, cooperative, very pleasant, very aware of his condition.  Pupils equal.  Breathing nonlabored.  Heart rate is regular.  Abdomen is extremely large but nontender.  The wound is described above.  We will continue with Santyl, add little collagen today, and see him back in 1 week.     [name], DO     CS/MEDQ  D:  10/08/2013  T:  10/08/2013  Job:  [id number]

## 2013-10-23 NOTE — Progress Notes (Signed)
[facility]  NAME:  Joseph [name], Joseph [name] NO.:  [id number]  MEDICAL RECORD NO.:  [id number]      DATE OF BIRTH:  1956-03-24  PHYSICIAN:  [name], DO       VISIT DATE:  10/22/2013                                  OFFICE VISIT   HISTORY OF PRESENT ILLNESS:  The patient is a 58 year old male who is here for a followup on his left diabetic foot ulcer secondary to trauma. He is doing well still and the area is healing and decrease in depth and in overall size.  The numbers are noted in the chart.  There has been no change in his medications or social history.  REVIEW OF SYSTEMS:  Negative.  PHYSICAL EXAMINATION:  GENERAL:  On exam, he is alert, oriented, and cooperative.  He is pleasant. HEENT:  Pupils are equal.  Extraocular muscles are intact. NECK:  No cervical lymphadenopathy. CHEST:  His breathing is unlabored. HEART:  His heart rate is regular. 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  He is using the compression and I am sure that this is making a difference. The wound is described above.  PLAN:  We will continue with the Santyl until its gone, which will be in the next few days and then transition to just collagen.  Continue to keep it elevated.  Multivitamin, vitamin C, zinc protein, and diabetic sugar control.     [name], DO     CS/MEDQ  D:  10/22/2013  T:  10/22/2013  Job:  [id number]

## 2013-10-29 ENCOUNTER — Encounter (HOSPITAL_BASED_OUTPATIENT_CLINIC_OR_DEPARTMENT_OTHER): Payer: Medicare Other | Attending: Plastic Surgery

## 2013-10-29 DIAGNOSIS — L97509 Non-pressure chronic ulcer of other part of unspecified foot with unspecified severity: Secondary | ICD-10-CM | POA: Insufficient documentation

## 2013-10-29 DIAGNOSIS — E1169 Type 2 diabetes mellitus with other specified complication: Secondary | ICD-10-CM | POA: Insufficient documentation

## 2013-10-29 DIAGNOSIS — I890 Lymphedema, not elsewhere classified: Secondary | ICD-10-CM | POA: Insufficient documentation

## 2013-11-12 NOTE — Progress Notes (Signed)
[facility]  NAME:  Joseph, [name] NO.:  [id number]  MEDICAL RECORD NO.:  [id number]      DATE OF BIRTH:  1956-07-29  PHYSICIAN:  [name], DO       VISIT DATE:  11/12/2013                                  OFFICE VISIT   HISTORY OF PRESENT ILLNESS:  The patient is a 58 year old gentleman, who is here for a followup after injuring his left foot.  He has a diabetic foot ulcer that is now chronic, but he is doing extremely well with the collagen and is healing up.  There is a very small area that is still open.  There has been no change in his medications or social history. Although, he has not been taking his Lasix in last few days and feels a little short of breath.  PHYSICAL EXAMINATION:  On exam, he is alert, oriented, and cooperative, not in any acute distress.  His breathing is unlabored here in the office.  Pulses are regular.  He has the usual lymphedema and hemosiderosis of the legs, but the wound is markedly improved.  There is no pitting edema on his lower extremities.  Recommend to calling his PCP, taking his Lasix, keeping his leg elevated, increasing the protein, using the collagen and strict diabetic control.  We will see him back in a week.     [name], DO     CS/MEDQ  D:  11/12/2013  T:  11/12/2013  Job:  [id number]

## 2013-11-20 NOTE — Progress Notes (Signed)
[facility]  NAME:  Joseph [name], Joseph [name] NO.:  [id number]  MEDICAL RECORD NO.:  [id number]      DATE OF BIRTH:  [birth year]  PHYSICIAN:  [name], DO       VISIT DATE:  11/19/2013                                  OFFICE VISIT   SUBJECTIVE:  The patient is a 58 year old gentleman who is here with his wife for a followup on his left heel ulcer, chronic, secondary to trauma and diabetic ulcer.  He is doing extremely well and is healing in the area nicely.  He has been using collagen on the wound and continue with his wraps and a multivitamin and elevation as able.  There has been no change in medications or social history.  OBJECTIVE:  He is alert, oriented, cooperative, not in any acute distress.  He is very pleasant.  He is obese and is aware of this.  His breathing is unlabored.  His pulses are regular.  ASSESSMENT/PLAN:  We will continue with the silver collagen with the wrap and elevation, multivitamin, vitamin C, zinc, and follow up in 1 week, and the size of the wound is noted in the chart.  His leg is just a little bit red, but it does not infected and the wound has improved markedly.     [name], DO     CS/MEDQ  D:  11/19/2013  T:  11/20/2013  Job:  [id number]

## 2013-11-26 ENCOUNTER — Encounter (HOSPITAL_BASED_OUTPATIENT_CLINIC_OR_DEPARTMENT_OTHER): Payer: Medicare Other | Attending: Plastic Surgery

## 2013-11-26 DIAGNOSIS — E1169 Type 2 diabetes mellitus with other specified complication: Secondary | ICD-10-CM | POA: Insufficient documentation

## 2013-11-26 DIAGNOSIS — L97409 Non-pressure chronic ulcer of unspecified heel and midfoot with unspecified severity: Secondary | ICD-10-CM | POA: Insufficient documentation

## 2013-11-26 DIAGNOSIS — E669 Obesity, unspecified: Secondary | ICD-10-CM | POA: Insufficient documentation

## 2013-11-27 NOTE — Progress Notes (Signed)
[facility]  NAME:  Joseph [name], Joseph [name]                       ACCOUNT NO.:  MEDICAL RECORD NO.:  [id number]      DATE OF BIRTH:  [birth year]  PHYSICIAN:  [name], DO       VISIT DATE:  11/26/2013                                  OFFICE VISIT   The patient is a 58 year old gentleman, who had a traumatic injury to his left heel, Wagner II, chronic diabetic ulcer.  He is doing extremely well and nearly half the thing healed.  There has been no change in his medication or social history.  Review of systems is negative.  The size is noted in the nurse's note.  On exam, he is alert, oriented, cooperative, very pleased with his progress, not in any acute distress.  Breathing is unlabored.  Heart rate is regular.  He is obese.  The swelling has improved some in his legs.  The wound is nearly healed.  We will continue with 1 more week of collagen and see him back in a week.     [name], DO     CS/MEDQ  D:  11/26/2013  T:  11/26/2013  Job:  [id number]

## 2013-12-11 NOTE — Progress Notes (Signed)
[facility]  NAME:  Joseph [name], CUNY NO.:  [id number]  MEDICAL RECORD NO.:  [id number]      DATE OF BIRTH:  02-28-1956  PHYSICIAN:  [name], DO       VISIT DATE:  12/10/2013                                  OFFICE VISIT   HISTORY OF PRESENT ILLNESS:  The patient is a 58 year old gentleman who is here for a followup on his left heel ulcer, diabetic ulcer, secondary to trauma.  He is doing extremely well and has completely healed the area.  There is no sign of infection.  The redness has improved.  The overall appearance of the leg has improved.  He has been using his compression and up to now was using collagen.  There has been no change in his medication or social history.  He still has very good family support and he is a nonsmoker.  REVIEW OF SYSTEMS:  Otherwise negative except that he mentions funiculitis and has some questions about the possibility of having that surgically treated and referral was given to Dr. Luetta Nutting at [place]:  GENERAL:  He is alert, oriented, cooperative, not in any distress.  He is pleasant.  Very pleased with his progress. HEENT:  Pupils equal.  Extraocular muscles are intact. RESPIRATORY:  His breathing is unlabored. HEART:  Rate is regular. ABDOMEN:  Extremely large, but nontender.  The wound is healed.  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  RECOMMENDATION:  Lotion, triple antibiotic ointment if there is any breakdown of the skin, elevation, protein, multivitamin, strict diabetic control, and compression.  Follow up as needed.     [name], DO     CS/MEDQ  D:  12/10/2013  T:  12/10/2013  Job:  [id number]

## 2014-02-20 DIAGNOSIS — E1140 Type 2 diabetes mellitus with diabetic neuropathy, unspecified: Secondary | ICD-10-CM | POA: Insufficient documentation

## 2014-03-22 ENCOUNTER — Encounter (INDEPENDENT_AMBULATORY_CARE_PROVIDER_SITE_OTHER): Payer: Self-pay | Admitting: Surgery

## 2014-03-22 ENCOUNTER — Ambulatory Visit (INDEPENDENT_AMBULATORY_CARE_PROVIDER_SITE_OTHER): Payer: Medicare Other | Admitting: Surgery

## 2014-03-22 DIAGNOSIS — I10 Essential (primary) hypertension: Secondary | ICD-10-CM | POA: Insufficient documentation

## 2014-03-22 DIAGNOSIS — G4733 Obstructive sleep apnea (adult) (pediatric): Secondary | ICD-10-CM | POA: Insufficient documentation

## 2014-03-22 DIAGNOSIS — E119 Type 2 diabetes mellitus without complications: Secondary | ICD-10-CM | POA: Insufficient documentation

## 2014-03-22 DIAGNOSIS — E7800 Pure hypercholesterolemia, unspecified: Secondary | ICD-10-CM | POA: Insufficient documentation

## 2014-03-22 DIAGNOSIS — Z86711 Personal history of pulmonary embolism: Secondary | ICD-10-CM | POA: Insufficient documentation

## 2014-03-22 DIAGNOSIS — M793 Panniculitis, unspecified: Secondary | ICD-10-CM

## 2014-03-22 NOTE — Patient Instructions (Signed)
Sleeve Gastrectomy A sleeve gastrectomy is a surgery in which a large portion of the stomach is removed. After the surgery, the stomach will be a narrow tube about the size of a banana. This surgery is performed to help a person lose weight. The person loses weight because the reduced size of the stomach restricts the amount of food that the person can eat. The stomach will hold much less food than before the surgery. Also, the part of the stomach that is removed produces a hormone that causes hunger.  This surgery is done for people who have morbid obesity, defined as a body mass index (BMI) greater than 40. BMI is an estimate of body fat and is calculated from the height and weight of a person. This surgery may also be done for people with a BMI between 35 and 40 if they have other diseases, such as type 2 diabetes mellitus, obstructive sleep apnea, or heart and lung disorders (cardiopulmonary diseases).  LET YOUR HEALTH CARE PROVIDER KNOW ABOUT:  Any allergies you have.   All medicines you are taking, including vitamins, herbs, eyedrops, creams, and over-the-counter medicines.   Use of steroids (by mouth or creams).   Previous problems you or members of your family have had with the use of anesthetics.   Any blood disorders you have.   Previous surgeries you have had.   Possibility of pregnancy, if this applies.   Other health problems you have. RISKS AND COMPLICATIONS Generally, sleeve gastrectomy is a safe procedure. However, as with any procedure, complications can occur. Possible complications include:  Infection.  Bleeding.  Blood clots.  Damage to other organs or tissue.  Leakage of fluid from the stomach into the abdominal cavity (rare). BEFORE THE PROCEDURE  You may need to have blood tests and imaging tests (such as X-rays or ultrasonography) done before the day of surgery. A test to evaluate your esophagus and how it moves (esophageal manometry) may also be  done.  You may be placed on a liquid diet 2-3 weeks before the surgery.  Ask your health care provider about changing or stopping your regular medicines.  Do not eat or drink anything for at least 8 hours before the procedure.   Make plans to have someone drive you home after your hospital stay. Also arrange for someone to help you with activities during recovery. PROCEDURE  A laparoscopic technique is usually used for this surgery:  You will be given medicine to make you sleep through the procedure (general anesthetic). This medicine will be given through an intravenous (IV) access tube that is put into one of your veins.  Once you are asleep, your abdomen will be cleaned and sterilized.  Several small incisions will be made in your abdomen.  Your abdomen will be filled with air so that it expands. This gives the surgeon more room to operate and makes your organs easier to see.  A thin, lighted tube with a tiny camera on the end (laparoscope) is put through a small incision in your abdomen. The camera on the laparoscope sends a picture to a TV screen in the operating room. This gives the surgeon a good view inside the abdomen.  Hollow tubes are put through the other small incisions in your abdomen. The tools needed for the procedure are put through these tubes.  The surgeon uses staples to divide part of the stomach and then removes it through one of the incisions.  The remaining stomach may be reinforced using stitches   or surgical glue or both to prevent leakage of the stomach contents. A small tube (drain) may be placed through one of the incisions to allow extra fluid to flow from the area.  The incisions are closed with stitches, staples, or glue. AFTER THE PROCEDURE  You will be monitored closely in a recovery area. Once the anesthetic has worn off, you will likely be moved to a regular hospital room.  You will be given medicine for pain and nausea.   You may have a drain  from one of the incisions in your abdomen. If a drain is used, it may stay in place after you go home from the hospital and be removed at a follow-up appointment.   You will be encouraged to walk around several times a day. This helps prevent blood clots.  You will be started on a liquid diet the first day after your surgery. Sometimes a test is done to check for leaking before you can eat.  You will be urged to cough and do deep breathing exercises. This helps prevent a lung infection after a surgery.  You will likely need to stay in the hospital for a few days.  Document Released: 07/11/2009 Document Revised: 05/16/2013 Document Reviewed: 01/26/2013 [employer] Patient Information 2015 [employer]. This information is not intended to replace advice given to you by your health care provider. Make sure you discuss any questions you have with your health care provider.  

## 2014-03-22 NOTE — Addendum Note (Signed)
Addended by: Ivor Costa on: 03/22/2014 05:15 PM   Modules accepted: Orders

## 2014-03-22 NOTE — Progress Notes (Signed)
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 is an 58 y.o. male who presents for mobility issues and morbid obesity with brawny edema in a massive abdominal pannus that hangs near his knees.  He is desparate.  In our discussion of panniculitis we discussed bariatric surgery.  I explained sleeve gastrectomy in detail and would consider staging him by performing the sleeve gastrectomy first and or combined with panniculectomy.  He would like to pursue this.    Perhaps Dr. Nancy Fetter has been supervising his weight loss attempts and can help with Medicare verification.   Past Medical History  Diagnosis Date  . Morbid obesity   . Pure hypercholesterolemia   . Essential hypertension, benign   . 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 Esophageal reflux   . Myalgia and myositis, unspecified   . Unspecified sleep apnea   . Type II or unspecified type diabetes mellitus without mention of complication, not stated as uncontrolled   . Anxiety state, unspecified   . Essential hypertension, benign   . Varicose veins of lower extremities with inflammation   . Peripheral vascular disease, unspecified   . Edema   . Obstructive sleep apnea (adult) (pediatric)   . Type II or unspecified type diabetes mellitus with neurological manifestations, not stated as uncontrolled   . Polyneuropathy in diabetes(357.2)   . Personal history of PE (pulmonary embolism)   . Depression     Past Surgical History  Procedure Laterality Date  . Cataracts      Current Outpatient Prescriptions  Medication Sig Dispense Refill  . amLODipine (NORVASC) 5 MG tablet Take 5 mg by mouth daily.      . clindamycin (CLEOCIN) 150 MG capsule Take 2 capsules (300 mg total) by mouth every 6 (six) hours.  56 capsule  0  . FLUoxetine (PROZAC) 40 MG capsule Take 40 mg by mouth daily.      . furosemide (LASIX) 20 MG tablet Take  20 mg by mouth.      . furosemide (LASIX) 20 MG tablet 1 tablet      . indapamide (LOZOL) 2.5 MG tablet Take 2.5 mg by mouth 2 (two) times daily.      . insulin glargine (LANTUS) 100 UNIT/ML injection Inject 50 Units into the skin at bedtime.       . insulin lispro (HUMALOG) 100 UNIT/ML injection Inject 15-20 Units into the skin 3 (three) times daily before meals. Per sliding scale      . metFORMIN (GLUCOPHAGE) 1000 MG tablet Take 1,000 mg by mouth 2 (two) times daily with a meal.      . OVER THE COUNTER MEDICATION Take 1 tablet by mouth 3 (three) times daily. Over the counter antacid from [facility]      . pioglitazone (ACTOS) 45 MG tablet Take 45 mg by mouth daily.      . pravastatin (PRAVACHOL) 40 MG tablet Take 40 mg by mouth daily.      . quinapril (ACCUPRIL) 20 MG tablet Take 20 mg by mouth daily.       Marland Kitchen warfarin (COUMADIN) 5 MG tablet Take 5-7.5 mg by mouth at bedtime. Except Monday and Friday 7.5mg        No current facility-administered medications for this visit.   Review of patient's allergies indicates no known allergies. No family history on file. Social History:   has no tobacco, alcohol, and drug  history on file.   REVIEW OF SYSTEMS : Positive for many things referable to DM ; otherwise negative  Physical Exam:   Blood pressure 134/86, pulse 80, temperature 97.8 F (36.6 C), temperature source Temporal, resp. rate 18, height 6\' 1"  (1.854 m), weight 491 lb 9.6 oz (222.988 kg). Body mass index is 64.87 kg/(m^2).  Gen:  WDWN WM NAD  Neurological: Alert and oriented to person, place, and time. Motor and sensory function is grossly intact  Head: Normocephalic and atraumatic.  Eyes: Conjunctivae are normal. Pupils are equal, round, and reactive to light. No scleral icterus.  Neck: Normal range of motion. Neck supple. No tracheal deviation or thyromegaly present.  Cardiovascular:  SR without murmurs or gallops.  No carotid bruits Breast:  Not examined Respiratory: Effort  normal.  No respiratory distress. No chest wall tenderness. Breath sounds normal.  No wheezes, rales or rhonchi.  Abdomen:  Huge pannus with brawny edema radiating onto the legs GU:  Shrunken genitalia retracted Musculoskeletal: Normal range of motion. Extremities are nontender. No cyanosis, edema or clubbing noted Lymphadenopathy: No cervical, preauricular, postauricular or axillary adenopathy is present Skin: Skin is warm and dry. No rash noted. No diaphoresis. No erythema. No pallor. Pscyh: Normal mood and affect. Behavior is normal. Judgment and thought content normal.   LABORATORY RESULTS: No results found for this or any previous visit (from the past 48 hour(s)).   RADIOLOGY RESULTS: No results found.  Problem List: Patient Active Problem List   Diagnosis Date Noted  . Morbid obesity BMI 65 03/22/2014  . Pure hypercholesterolemia 03/22/2014  . Essential hypertension, benign 03/22/2014  . Diabetes 03/22/2014  . Panniculitis 03/22/2014  . OSA (obstructive sleep apnea) 03/22/2014  . Hx pulmonary embolism 03/22/2014    Assessment & Plan: Super morbid obesity with massive abdominal pannus    Matt B. Hassell Done, MD, [facility] [phone] beeper [phone]  03/22/2014 4:57 PM

## 2014-04-11 ENCOUNTER — Ambulatory Visit: Payer: Medicare Other | Admitting: Dietician

## 2014-05-24 ENCOUNTER — Ambulatory Visit (HOSPITAL_COMMUNITY): Payer: Medicare Other

## 2014-06-21 ENCOUNTER — Other Ambulatory Visit (INDEPENDENT_AMBULATORY_CARE_PROVIDER_SITE_OTHER): Payer: Self-pay

## 2014-06-21 DIAGNOSIS — Z86711 Personal history of pulmonary embolism: Secondary | ICD-10-CM

## 2014-06-28 ENCOUNTER — Encounter (HOSPITAL_COMMUNITY): Payer: Self-pay | Admitting: Pharmacy Technician

## 2014-07-01 ENCOUNTER — Other Ambulatory Visit: Payer: Self-pay | Admitting: Radiology

## 2014-07-03 ENCOUNTER — Ambulatory Visit (HOSPITAL_COMMUNITY)
Admission: RE | Admit: 2014-07-03 | Discharge: 2014-07-03 | Disposition: A | Discharge status: Home / Self Care | Payer: Medicare Other | Source: Ambulatory Visit | Attending: Surgery | Admitting: Surgery

## 2014-07-03 ENCOUNTER — Encounter (HOSPITAL_COMMUNITY): Payer: Self-pay

## 2014-07-03 DIAGNOSIS — Z794 Long term (current) use of insulin: Secondary | ICD-10-CM | POA: Diagnosis not present

## 2014-07-03 DIAGNOSIS — Z6841 Body Mass Index (BMI) 40.0 and over, adult: Secondary | ICD-10-CM | POA: Insufficient documentation

## 2014-07-03 DIAGNOSIS — I10 Essential (primary) hypertension: Secondary | ICD-10-CM | POA: Diagnosis not present

## 2014-07-03 DIAGNOSIS — I2782 Chronic pulmonary embolism: Secondary | ICD-10-CM | POA: Diagnosis not present

## 2014-07-03 DIAGNOSIS — Z7901 Long term (current) use of anticoagulants: Secondary | ICD-10-CM | POA: Insufficient documentation

## 2014-07-03 DIAGNOSIS — E1142 Type 2 diabetes mellitus with diabetic polyneuropathy: Secondary | ICD-10-CM | POA: Diagnosis not present

## 2014-07-03 DIAGNOSIS — Z86718 Personal history of other venous thrombosis and embolism: Secondary | ICD-10-CM | POA: Diagnosis not present

## 2014-07-03 DIAGNOSIS — Z86711 Personal history of pulmonary embolism: Secondary | ICD-10-CM

## 2014-07-03 DIAGNOSIS — Z79899 Other long term (current) drug therapy: Secondary | ICD-10-CM | POA: Diagnosis not present

## 2014-07-03 HISTORY — DX: Acute embolism and thrombosis of unspecified deep veins of unspecified lower extremity: I82.409

## 2014-07-03 LAB — GLUCOSE, CAPILLARY: Glucose-Capillary: 171 mg/dL — ABNORMAL HIGH (ref 70–99)

## 2014-07-03 LAB — BASIC METABOLIC PANEL
Anion gap: 12 (ref 5–15)
BUN: 19 mg/dL (ref 6–23)
CALCIUM: 8.8 mg/dL (ref 8.4–10.5)
CO2: 28 meq/L (ref 19–32)
CREATININE: 0.99 mg/dL (ref 0.50–1.35)
Chloride: 101 mEq/L (ref 96–112)
GFR calc non Af Amer: 89 mL/min — ABNORMAL LOW (ref >90)
Glucose, Bld: 185 mg/dL — ABNORMAL HIGH (ref 70–99)
Potassium: 4.3 mEq/L (ref 3.7–5.3)
Sodium: 141 mEq/L (ref 137–147)

## 2014-07-03 LAB — CBC
HEMATOCRIT: 40.1 % (ref 39.0–52.0)
Hemoglobin: 12.8 g/dL — ABNORMAL LOW (ref 13.0–17.0)
MCH: 27.5 pg (ref 26.0–34.0)
MCHC: 31.9 g/dL (ref 30.0–36.0)
MCV: 86.2 fL (ref 78.0–100.0)
Platelets: 317 10*3/uL (ref 150–400)
RBC: 4.65 MIL/uL (ref 4.22–5.81)
RDW: 15.1 % (ref 11.5–15.5)
WBC: 6.9 10*3/uL (ref 4.0–10.5)

## 2014-07-03 LAB — PROTIME-INR
INR: 1.78 — ABNORMAL HIGH (ref 0.00–1.49)
Prothrombin Time: 20.9 seconds — ABNORMAL HIGH (ref 11.6–15.2)

## 2014-07-03 MED ORDER — FENTANYL CITRATE 0.05 MG/ML IJ SOLN
INTRAMUSCULAR | Status: AC | PRN
  Administered 2014-07-03 (×2): 50 ug via INTRAVENOUS

## 2014-07-03 MED ORDER — LIDOCAINE HCL 1 % IJ SOLN
INTRAMUSCULAR | Status: AC
  Filled 2014-07-03: qty 20

## 2014-07-03 MED ORDER — SODIUM CHLORIDE 0.9 % IV SOLN
INTRAVENOUS | Status: DC
  Administered 2014-07-03: 13:00:00 via INTRAVENOUS

## 2014-07-03 MED ORDER — MIDAZOLAM HCL 2 MG/2ML IJ SOLN
INTRAMUSCULAR | Status: AC | PRN
  Administered 2014-07-03: 0.5 mg via INTRAVENOUS
  Administered 2014-07-03: 1 mg via INTRAVENOUS
  Administered 2014-07-03: 0.5 mg via INTRAVENOUS
  Administered 2014-07-03: 1 mg via INTRAVENOUS

## 2014-07-03 MED ORDER — IOHEXOL 300 MG/ML  SOLN
50.0000 mL | Freq: Once | INTRAMUSCULAR | Status: AC | PRN
  Administered 2014-07-03: 80 mL via INTRAVENOUS

## 2014-07-03 MED ORDER — MIDAZOLAM HCL 2 MG/2ML IJ SOLN
INTRAMUSCULAR | Status: AC
  Filled 2014-07-03: qty 6

## 2014-07-03 MED ORDER — FENTANYL CITRATE 0.05 MG/ML IJ SOLN
INTRAMUSCULAR | Status: AC
  Filled 2014-07-03: qty 6

## 2014-07-03 NOTE — Procedures (Signed)
Successful ICV FILTER INSERTION PREOPERATIVELY NO COMP STABLE FULL REPORT IN PACS EVAL FOR REMOVAL 3-4 MONTHS POST OP

## 2014-07-03 NOTE — H&P (Signed)
Chief Complaint: "I'm here to get a filter put in"  Referring Physician(s): Martin,Matt  History of Present Illness: Joseph [name]. is a 58 y.o. male with remote history of LLE DVT/PE (on coumadin), morbid obesity and upcoming panniculectomy on 07/15/14 who presents today for prophylactic  IVC filter placement .  Past Medical History  Diagnosis Date  . Morbid obesity   . Pure hypercholesterolemia   . Essential hypertension, benign   . 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 Esophageal reflux   . Myalgia and myositis, unspecified   . Type II or unspecified type diabetes mellitus without mention of complication, not stated as uncontrolled   . Anxiety state, unspecified   . Essential hypertension, benign   . Varicose veins of lower extremities with inflammation   . Peripheral vascular disease, unspecified   . Edema   . Type II or unspecified type diabetes mellitus with neurological manifestations, not stated as uncontrolled   . Polyneuropathy in diabetes(357.2)   . Personal history of PE (pulmonary embolism) 2004  . Depression   . Unspecified sleep apnea     uses CPAP  . Obstructive sleep apnea (adult) (pediatric)   . DVT (deep venous thrombosis) 2004    Past Surgical History  Procedure Laterality Date  . Cataracts      Denies- never had cataract surgery  . No past surgeries      Allergies: Review of patient's allergies indicates no known allergies.  Medications: Prior to Admission medications   Medication Sig Start Date End Date Taking? Authorizing Provider  acetaminophen (TYLENOL) 500 MG tablet Take 1,000 mg by mouth once as needed.   Yes Historical Provider, MD  amLODipine (NORVASC) 5 MG tablet Take 5 mg by mouth every morning.    Yes Historical Provider, MD  famotidine (PEPCID) 20 MG tablet Take 20 mg by mouth 2 (two) times daily.   Yes Historical Provider, MD  FLUoxetine (PROZAC) 40 MG capsule Take 40  mg by mouth at bedtime.    Yes Historical Provider, MD  furosemide (LASIX) 20 MG tablet Take 20 mg by mouth every morning.    Yes Historical Provider, MD  indapamide (LOZOL) 2.5 MG tablet Take 5 mg by mouth every morning.    Yes Historical Provider, MD  insulin glargine (LANTUS) 100 UNIT/ML injection Inject 50 Units into the skin at bedtime.    Yes Historical Provider, MD  insulin lispro (HUMALOG) 100 UNIT/ML injection Inject 15-20 Units into the skin 3 (three) times daily before meals. Per sliding scale   Yes Historical Provider, MD  metFORMIN (GLUCOPHAGE) 1000 MG tablet Take 1,000 mg by mouth 2 (two) times daily with a meal.   Yes Historical Provider, MD  OVER THE COUNTER MEDICATION Take 1 tablet by mouth 2 (two) times daily. Over the counter antacid from [facility]   Yes Historical Provider, MD  pioglitazone (ACTOS) 45 MG tablet Take 45 mg by mouth daily.   Yes Historical Provider, MD  pravastatin (PRAVACHOL) 40 MG tablet Take 40 mg by mouth at bedtime.    Yes Historical Provider, MD  Prenatal Multivit-Min-Fe-FA (PRENATAL 1 + IRON PO) Take 1 tablet by mouth every morning.   Yes Historical Provider, MD  quinapril (ACCUPRIL) 20 MG tablet Take 20 mg by mouth every morning.    Yes Historical Provider, MD  warfarin (COUMADIN) 5 MG tablet Take 5-7.5 mg by mouth at bedtime. Except Monday and Friday 7.5mg    Yes  Historical Provider, MD    History reviewed. No pertinent family history.  History   Social History  . Marital Status: Married    Spouse Name: N/A    Number of Children: N/A  . Years of Education: N/A   Social History Main Topics  . Smoking status: Never Smoker   . Smokeless tobacco: None  . Alcohol Use: None  . Drug Use: None  . Sexual Activity: None   Other Topics Concern  . None   Social History Narrative  . None       Review of Systems  Constitutional: Negative for fever and chills.  Respiratory: Negative for cough and shortness of breath.   Cardiovascular: Negative  for chest pain.  Gastrointestinal: Negative for nausea, vomiting, abdominal pain and blood in stool.  Genitourinary: Negative for dysuria and hematuria.  Musculoskeletal: Positive for back pain.  Neurological: Negative for headaches.    Vital Signs: BP 150/54  Pulse 79  Temp(Src) 98.3 F (36.8 C) (Oral)  Resp 20  Ht 6\' 1"  (1.854 m)  Wt 486 lb (220.448 kg)  BMI 64.13 kg/m2  SpO2 99%  Physical Exam  Constitutional: He is oriented to person, place, and time. He appears well-developed and well-nourished.  Cardiovascular: Normal rate and regular rhythm.   Pulmonary/Chest: Effort normal and breath sounds normal.  Abdominal: Soft. Bowel sounds are normal. There is no tenderness.  Morbidly obese with large pannus  Musculoskeletal: Normal range of motion. He exhibits edema.  Neurological: He is alert and oriented to person, place, and time.    Imaging: No results found.  Labs:  CBC:  Recent Labs  07/03/14 1250  WBC 6.9  HGB 12.8*  HCT 40.1  PLT 317    COAGS:  Recent Labs  07/03/14 1250  INR 1.78*    BMP:  Recent Labs  07/03/14 1250  NA 141  K 4.3  CL 101  CO2 28  GLUCOSE 185*  BUN 19  CALCIUM 8.8  CREATININE 0.99  GFRNONAA 89*  GFRAA >90    LIVER FUNCTION TESTS: No results found for this basename: BILITOT, AST, ALT, ALKPHOS, PROT, ALBUMIN,  in the last 8760 hours Results for orders placed during the hospital encounter of [id number]  BASIC METABOLIC PANEL      Result Value Ref Range   Sodium 141  137 - 147 mEq/L   Potassium 4.3  3.7 - 5.3 mEq/L   Chloride 101  96 - 112 mEq/L   CO2 28  19 - 32 mEq/L   Glucose, Bld 185 (*) 70 - 99 mg/dL   BUN 19  6 - 23 mg/dL   Creatinine, Ser 0.99  0.50 - 1.35 mg/dL   Calcium 8.8  8.4 - 10.5 mg/dL   GFR calc non Af Amer 89 (*) >90 mL/min   GFR calc Af Amer >90  >90 mL/min   Anion gap 12  5 - 15  CBC      Result Value Ref Range   WBC 6.9  4.0 - 10.5 K/uL   RBC 4.65  4.22 - 5.81 MIL/uL   Hemoglobin 12.8 (*)  13.0 - 17.0 g/dL   HCT 40.1  39.0 - 52.0 %   MCV 86.2  78.0 - 100.0 fL   MCH 27.5  26.0 - 34.0 pg   MCHC 31.9  30.0 - 36.0 g/dL   RDW 15.1  11.5 - 15.5 %   Platelets 317  150 - 400 K/uL  PROTIME-INR      Result Value  Ref Range   Prothrombin Time 20.9 (*) 11.6 - 15.2 seconds   INR 1.78 (*) 0.00 - 1.49    TUMOR MARKERS: No results found for this basename: AFPTM, CEA, CA199, CHROMGRNA,  in the last 8760 hours  Assessment and Plan: Ralphel Wohlrab. is a 58 y.o. male with remote history of LLE DVT/PE (on coumadin), morbid obesity and upcoming panniculectomy on 07/15/14 who presents today for prophylactic  IVC filter placement . Details/risks of procedure d/w pt/wife with their understanding and consent.        Signed: Autumn Messing 07/03/2014, 1:40 PM

## 2014-07-03 NOTE — Discharge Instructions (Signed)
Inferior Vena Cava Filter Insertion Insertion of an inferior vena cava (IVC) filter is a procedure in which a filter is placed into the large vein in your abdomen that carries blood from the lower part of your body to your heart (inferior vena cava). Placement of the filter here helps prevent blood clots in the legs or pelvis from traveling to your lungs. A large blood clot in the lungs can cause death.  The filter is a small, metal device about an inch long. It is shaped like the spokes of an umbrella and is inserted through a pathway created in your neck or groin. The risks of this procedure are usually small and easily managed. Inferior vena cava filters are only used when blood thinners (anticoagulants) cannot be used to prevent blood clots from forming. This may occur because of:  You have severe platelet problems or shortage.  You have had recent or current major bleeding that cannot be treated.  You have bleeding associated with anticoagulants.  You have recurrence of blood clots while on anticoagulants.  You have a need for surgery in the near future.  You have bleeding in your head. EXPECTATIONS OF A FILTER  An IVC filter will reduce the risk of a large blood clot making its way to your lungs (pulmonary embolus, or PE). It cannot eliminate the risk completely, or prevent small PEs from occurring.  It does not stop blood clots from growing, recurring, or developing into postphlebitic syndrome. This is a condition that can occur after there is inflammation in a vein (phlebitis). LET [facility] CARE PROVIDER KNOW ABOUT:  Any allergies you have. This includes an allergy to iodine or contrast dye.  All medicines you are taking, including vitamins, herbs, eye drops, creams and over-the-counter medicines.  Previous problems you or members of your family have had with the use of anesthetics.  Any blood disorders you have.  Previous surgeries you had had.  Medical conditions you  have.  Possibility of pregnancy, if this applies. RISKS AND COMPLICATIONS Generally, this is a safe procedure. However, as with any procedure, problems can occur. Possible problems include:  A small bruise around the needle insertion site. A larger pooling of blood called a hematoma may form. This is usually of no concern.  The filter can block the vena cava. This can cause some swelling of the legs.  The filter may eventually fail and not work properly.  Continued bleeding or infections (uncommon).  Damage to the vein by the catheter (rare). BEFORE THE PROCEDURE  Your health care provider may want you to have blood tests. These tests can help tell how well your kidneys and liver are working. They can also show how well your blood clots.  If you take blood thinners, ask your health care provider if and when you should stop taking them.  Do not eat or drink for 4 hours before the procedure or as directed by your health care provider.  Make arrangements for someone to drive you home. Depending on the procedure, you may be able to go home the same day.  PROCEDURE   Insertion of a Vena Cava filter is performed by a vascular surgeon or cardiologist in a cardiac catheterization lab.  The procedure usually takes about 30 minutes to 1 hour. This can vary.  An IV needle will be inserted into one of your veins. Medicine will be able to flow directly into your body through this needle.  Medicines may given to help you relax  and relieve anxiety (sedative).  The procedure is done through a large vein either in your neck or groin. The skin around this area is cleaned and shaved, as necessary.  The skin and deeper tissues over the vein will be made numb with a local anesthetic. You are awake during the procedure and can let your health care providers know if you have discomfort.  A needle is then put into the vein. A guide wire is placed through the needle and into the vein. This is used to  help insert a catheter and the IVC filter into your vein.  Contrast dye may be injected into the inferior vena cava to help guide the catheter and verify precise placement of the IVC filter. X-ray equipment may also be used to verify that the catheter and the wire are in the correct position.  The wire is then withdrawn.  The IVC filter is then passed over the catheter into the vein, and inserted into the correct location in the vena cava.  The catheter is then removed. Pressure will be kept on the needle insertion point for several minutes or until it is unlikely to bleed. AFTER THE PROCEDURE  You will stay in a recovery area until any sedation medicine you were given has worn off. Your blood pressure and pulse will be checked.  If there are no problems, you should be able to go home after the procedure.  You may feel sore at the area of the needle insertion for a few days. Document Released: 11/03/2005 Document Revised: 09/18/2013 Document Reviewed: 05/21/2013 [facility] Patient Information 2015 [place], [place]. This information is not intended to replace advice given to you by your health care provider. Make sure you discuss any questions you have with your health care provider. Conscious Sedation Sedation is the use of medicines to promote relaxation and relieve discomfort and anxiety. Conscious sedation is a type of sedation. Under conscious sedation you are less alert than normal but are still able to respond to instructions or stimulation. Conscious sedation is used during short medical and dental procedures. It is milder than deep sedation or general anesthesia and allows you to return to your regular activities sooner.  LET [facility] CARE PROVIDER KNOW ABOUT:   Any allergies you have.  All medicines you are taking, including vitamins, herbs, eye drops, creams, and over-the-counter medicines.  Use of steroids (by mouth or creams).  Previous problems you or members of your family  have had with the use of anesthetics.  Any blood disorders you have.  Previous surgeries you have had.  Medical conditions you have.  Possibility of pregnancy, if this applies.  Use of cigarettes, alcohol, or illegal drugs. RISKS AND COMPLICATIONS Generally, this is a safe procedure. However, as with any procedure, problems can occur. Possible problems include:  Oversedation.  Trouble breathing on your own. You may need to have a breathing tube until you are awake and breathing on your own.  Allergic reaction to any of the medicines used for the procedure. BEFORE THE PROCEDURE  You may have blood tests done. These tests can help show how well your kidneys and liver are working. They can also show how well your blood clots.  A physical exam will be done.  Only take medicines as directed by your health care provider. You may need to stop taking medicines (such as blood thinners, aspirin, or nonsteroidal anti-inflammatory drugs) before the procedure.   Do not eat or drink at least 6 hours before the  procedure or as directed by your health care provider.  Arrange for a responsible adult, family member, or friend to take you home after the procedure. He or she should stay with you for at least 24 hours after the procedure, until the medicine has worn off. PROCEDURE   An intravenous (IV) catheter will be inserted into one of your veins. Medicine will be able to flow directly into your body through this catheter. You may be given medicine through this tube to help prevent pain and help you relax.  The medical or dental procedure will be done. AFTER THE PROCEDURE  You will stay in a recovery area until the medicine has worn off. Your blood pressure and pulse will be checked.   Depending on the procedure you had, you may be allowed to go home when you can tolerate liquids and your pain is under control. Document Released: 06/08/2001 Document Revised: 09/18/2013 Document Reviewed:  05/21/2013 [facility] Patient Information 2015 [place], [place]. This information is not intended to replace advice given to you by your health care provider. Make sure you discuss any questions you have with your health care provider.

## 2014-07-05 NOTE — Progress Notes (Signed)
Please put orders in Epic surgery 07-15-14 pre op 07-08-14 Thanks

## 2014-07-08 ENCOUNTER — Encounter (HOSPITAL_COMMUNITY)
Admission: RE | Admit: 2014-07-08 | Discharge: 2014-07-08 | Disposition: A | Discharge status: Home / Self Care | Payer: Medicare Other | Source: Ambulatory Visit | Attending: Surgery | Admitting: Surgery

## 2014-07-08 ENCOUNTER — Ambulatory Visit (HOSPITAL_COMMUNITY)
Admission: RE | Admit: 2014-07-08 | Discharge: 2014-07-08 | Disposition: A | Discharge status: Home / Self Care | Payer: Medicare Other | Source: Ambulatory Visit | Attending: Anesthesiology | Admitting: Anesthesiology

## 2014-07-08 ENCOUNTER — Encounter (HOSPITAL_COMMUNITY): Payer: Self-pay

## 2014-07-08 ENCOUNTER — Encounter (HOSPITAL_COMMUNITY): Payer: Self-pay | Admitting: Anesthesiology

## 2014-07-08 DIAGNOSIS — I10 Essential (primary) hypertension: Secondary | ICD-10-CM | POA: Diagnosis not present

## 2014-07-08 DIAGNOSIS — R9431 Abnormal electrocardiogram [ECG] [EKG]: Secondary | ICD-10-CM | POA: Insufficient documentation

## 2014-07-08 DIAGNOSIS — J984 Other disorders of lung: Secondary | ICD-10-CM | POA: Insufficient documentation

## 2014-07-08 DIAGNOSIS — Z01810 Encounter for preprocedural cardiovascular examination: Secondary | ICD-10-CM | POA: Insufficient documentation

## 2014-07-08 DIAGNOSIS — Z01811 Encounter for preprocedural respiratory examination: Secondary | ICD-10-CM | POA: Insufficient documentation

## 2014-07-08 DIAGNOSIS — Z01812 Encounter for preprocedural laboratory examination: Secondary | ICD-10-CM | POA: Diagnosis not present

## 2014-07-08 HISTORY — DX: Other specified disorders of veins: I87.8

## 2014-07-08 HISTORY — DX: Shortness of breath: R06.02

## 2014-07-08 HISTORY — DX: Repeated falls: R29.6

## 2014-07-08 LAB — BASIC METABOLIC PANEL
Anion gap: 11 (ref 5–15)
BUN: 21 mg/dL (ref 6–23)
CALCIUM: 9.2 mg/dL (ref 8.4–10.5)
CO2: 30 meq/L (ref 19–32)
CREATININE: 1.09 mg/dL (ref 0.50–1.35)
Chloride: 99 mEq/L (ref 96–112)
GFR calc Af Amer: 85 mL/min — ABNORMAL LOW (ref >90)
GFR calc non Af Amer: 74 mL/min — ABNORMAL LOW (ref >90)
Glucose, Bld: 87 mg/dL (ref 70–99)
Potassium: 4 mEq/L (ref 3.7–5.3)
Sodium: 140 mEq/L (ref 137–147)

## 2014-07-08 LAB — CBC
HEMATOCRIT: 41.7 % (ref 39.0–52.0)
Hemoglobin: 13.2 g/dL (ref 13.0–17.0)
MCH: 28.1 pg (ref 26.0–34.0)
MCHC: 31.7 g/dL (ref 30.0–36.0)
MCV: 88.7 fL (ref 78.0–100.0)
PLATELETS: 357 10*3/uL (ref 150–400)
RBC: 4.7 MIL/uL (ref 4.22–5.81)
RDW: 15.1 % (ref 11.5–15.5)
WBC: 8.7 10*3/uL (ref 4.0–10.5)

## 2014-07-08 LAB — PROTIME-INR
INR: 2.41 — ABNORMAL HIGH (ref 0.00–1.49)
Prothrombin Time: 26.4 seconds — ABNORMAL HIGH (ref 11.6–15.2)

## 2014-07-08 LAB — APTT: aPTT: 46 seconds — ABNORMAL HIGH (ref 24–37)

## 2014-07-08 LAB — ABO/RH: ABO/RH(D): A POS

## 2014-07-08 NOTE — Patient Instructions (Signed)
YOUR SURGERY IS SCHEDULED AT [facility]  ON:    Monday  10/19  REPORT TO  SHORT STAY CENTER AT:  5:30 AM    DO NOT EAT OR DRINK ANYTHING AFTER MIDNIGHT THE NIGHT BEFORE YOUR SURGERY.  YOU MAY BRUSH YOUR TEETH, RINSE OUT YOUR MOUTH--BUT NO WATER, NO FOOD, NO CHEWING GUM, NO MINTS, NO CANDIES, NO CHEWING TOBACCO.  PLEASE TAKE THE FOLLOWING MEDICATIONS THE AM OF YOUR SURGERY WITH A FEW SIPS OF WATER:  AMLODIPINE, FAMOTIDINE   IF YOU ARE DIABETIC:  DO NOT TAKE ANY DIABETIC MEDICATIONS THE AM OF YOUR SURGERY.  IF YOU TAKE INSULIN IN THE EVENINGS--PLEASE ONLY TAKE 1/2 NORMAL EVENING DOSE THE NIGHT BEFORE YOUR SURGERY.  NO INSULIN THE AM OF YOUR SURGERY.  IF YOU HAVE SLEEP APNEA AND USE CPAP OR BIPAP--PLEASE BRING THE MASK AND THE TUBING.  DO NOT BRING YOUR MACHINE.  DO NOT BRING VALUABLES, MONEY, CREDIT CARDS.  DO NOT WEAR JEWELRY, MAKE-UP, NAIL POLISH AND NO METAL PINS OR CLIPS IN YOUR HAIR. CONTACT LENS, DENTURES / PARTIALS, GLASSES SHOULD NOT BE WORN TO SURGERY AND IN MOST CASES-HEARING AIDS WILL NEED TO BE REMOVED.  BRING YOUR GLASSES CASE, ANY EQUIPMENT NEEDED FOR YOUR CONTACT LENS. FOR PATIENTS ADMITTED TO THE HOSPITAL--CHECK OUT TIME THE DAY OF DISCHARGE IS 11:00 AM.  ALL INPATIENT ROOMS ARE PRIVATE - WITH BATHROOM, TELEPHONE, TELEVISION AND WIFI INTERNET.   PLEASE BE AWARE THAT YOU MAY NEED ADDITIONAL BLOOD DRAWN DAY OF YOUR SURGERY  _______________________________________________________________________   [facility] - Preparing for Surgery Before surgery, you can play an important role.  Because skin is not sterile, your skin needs to be as free of germs as possible.  You can reduce the number of germs on your skin by washing with CHG (chlorahexidine gluconate) soap before surgery.  CHG is an antiseptic cleaner which kills germs and bonds with the skin to continue killing germs even after washing. Please DO NOT use if you have an allergy to CHG or antibacterial  soaps.  If your skin becomes reddened/irritated stop using the CHG and inform your nurse when you arrive at Short Stay. Do not shave (including legs and underarms) for at least 48 hours prior to the first CHG shower.  You may shave your face/neck. Please follow these instructions carefully:  1.  Shower with CHG Soap the night before surgery and the  morning of Surgery.  2.  If you choose to wash your hair, wash your hair first as usual with your  normal  shampoo.  3.  After you shampoo, rinse your hair and body thoroughly to remove the  shampoo.                           4.  Use CHG as you would any other liquid soap.  You can apply chg directly  to the skin and wash                       Gently with a scrungie or clean washcloth.  5.  Apply the CHG Soap to your body ONLY FROM THE NECK DOWN.   Do not use on face/ open                           Wound or open sores. Avoid contact with eyes, ears mouth and genitals (private parts).  Wash face,  Genitals (private parts) with your normal soap.             6.  Wash thoroughly, paying special attention to the area where your surgery  will be performed.  7.  Thoroughly rinse your body with warm water from the neck down.  8.  DO NOT shower/wash with your normal soap after using and rinsing off  the CHG Soap.                9.  Pat yourself dry with a clean towel.            10.  Wear clean pajamas.            11.  Place clean sheets on your bed the night of your first shower and do not  sleep with pets. Day of Surgery : Do not apply any lotions/deodorants the morning of surgery.  Please wear clean clothes to the hospital/surgery center.  FAILURE TO FOLLOW THESE INSTRUCTIONS MAY RESULT IN THE CANCELLATION OF YOUR SURGERY PATIENT SIGNATURE_________________________________  NURSE SIGNATURE__________________________________  ________________________________________________________________________  WHAT IS A BLOOD TRANSFUSION? Blood  Transfusion Information  A transfusion is the replacement of blood or some of its parts. Blood is made up of multiple cells which provide different functions.  Red blood cells carry oxygen and are used for blood loss replacement.  White blood cells fight against infection.  Platelets control bleeding.  Plasma helps clot blood.  Other blood products are available for specialized needs, such as hemophilia or other clotting disorders. BEFORE THE TRANSFUSION  Who gives blood for transfusions?   Healthy volunteers who are fully evaluated to make sure their blood is safe. This is blood bank blood. Transfusion therapy is the safest it has ever been in the practice of medicine. Before blood is taken from a donor, a complete history is taken to make sure that person has no history of diseases nor engages in risky social behavior (examples are intravenous drug use or sexual activity with multiple partners). The donor's travel history is screened to minimize risk of transmitting infections, such as malaria. The donated blood is tested for signs of infectious diseases, such as HIV and hepatitis. The blood is then tested to be sure it is compatible with you in order to minimize the chance of a transfusion reaction. If you or a relative donates blood, this is often done in anticipation of surgery and is not appropriate for emergency situations. It takes many days to process the donated blood. RISKS AND COMPLICATIONS Although transfusion therapy is very safe and saves many lives, the main dangers of transfusion include:   Getting an infectious disease.  Developing a transfusion reaction. This is an allergic reaction to something in the blood you were given. Every precaution is taken to prevent this. The decision to have a blood transfusion has been considered carefully by your caregiver before blood is given. Blood is not given unless the benefits outweigh the risks. AFTER THE TRANSFUSION  Right after  receiving a blood transfusion, you will usually feel much better and more energetic. This is especially true if your red blood cells have gotten low (anemic). The transfusion raises the level of the red blood cells which carry oxygen, and this usually causes an energy increase.  The nurse administering the transfusion will monitor you carefully for complications. HOME CARE INSTRUCTIONS  No special instructions are needed after a transfusion. You may find your energy is better. Speak with your caregiver about any  limitations on activity for underlying diseases you may have. SEEK MEDICAL CARE IF:   Your condition is not improving after your transfusion.  You develop redness or irritation at the intravenous (IV) site. SEEK IMMEDIATE MEDICAL CARE IF:  Any of the following symptoms occur over the next 12 hours:  Shaking chills.  You have a temperature by mouth above 102 F (38.9 C), not controlled by medicine.  Chest, back, or muscle pain.  People around you feel you are not acting correctly or are confused.  Shortness of breath or difficulty breathing.  Dizziness and fainting.  You get a rash or develop hives.  You have a decrease in urine output.  Your urine turns a dark color or changes to pink, red, or brown. Any of the following symptoms occur over the next 10 days:  You have a temperature by mouth above 102 F (38.9 C), not controlled by medicine.  Shortness of breath.  Weakness after normal activity.  The white part of the eye turns yellow (jaundice).  You have a decrease in the amount of urine or are urinating less often.  Your urine turns a dark color or changes to pink, red, or brown. Document Released: 09/10/2000 Document Revised: 12/06/2011 Document Reviewed: 04/29/2008 [employer] Patient Information 2014 [place].  _______________________________________________________________________  Incentive Spirometer  An incentive spirometer is a tool that can  help keep your lungs clear and active. This tool measures how well you are filling your lungs with each breath. Taking long deep breaths may help reverse or decrease the chance of developing breathing (pulmonary) problems (especially infection) following:  A long period of time when you are unable to move or be active. BEFORE THE PROCEDURE   If the spirometer includes an indicator to show your best effort, your nurse or respiratory therapist will set it to a desired goal.  If possible, sit up straight or lean slightly forward. Try not to slouch.  Hold the incentive spirometer in an upright position. INSTRUCTIONS FOR USE  1. Sit on the edge of your bed if possible, or sit up as far as you can in bed or on a chair. 2. Hold the incentive spirometer in an upright position. 3. Breathe out normally. 4. Place the mouthpiece in your mouth and seal your lips tightly around it. 5. Breathe in slowly and as deeply as possible, raising the piston or the ball toward the top of the column. 6. Hold your breath for 3-5 seconds or for as long as possible. Allow the piston or ball to fall to the bottom of the column. 7. Remove the mouthpiece from your mouth and breathe out normally. 8. Rest for a few seconds and repeat Steps 1 through 7 at least 10 times every 1-2 hours when you are awake. Take your time and take a few normal breaths between deep breaths. 9. The spirometer may include an indicator to show your best effort. Use the indicator as a goal to work toward during each repetition. 10. After each set of 10 deep breaths, practice coughing to be sure your lungs are clear. If you have an incision (the cut made at the time of surgery), support your incision when coughing by placing a pillow or rolled up towels firmly against it. Once you are able to get out of bed, walk around indoors and cough well. You may stop using the incentive spirometer when instructed by your caregiver.  RISKS AND COMPLICATIONS  Take  your time so you do not get dizzy  or light-headed.  If you are in pain, you may need to take or ask for pain medication before doing incentive spirometry. It is harder to take a deep breath if you are having pain. AFTER USE  Rest and breathe slowly and easily.  It can be helpful to keep track of a log of your progress. Your caregiver can provide you with a simple table to help with this. If you are using the spirometer at home, follow these instructions: [place] IF:   You are having difficultly using the spirometer.  You have trouble using the spirometer as often as instructed.  Your pain medication is not giving enough relief while using the spirometer.  You develop fever of 100.5 F (38.1 C) or higher. SEEK IMMEDIATE MEDICAL CARE IF:   You cough up bloody sputum that had not been present before.  You develop fever of 102 F (38.9 C) or greater.  You develop worsening pain at or near the incision site. MAKE SURE YOU:   Understand these instructions.  Will watch your condition.  Will get help right away if you are not doing well or get worse. Document Released: 01/24/2007 Document Revised: 12/06/2011 Document Reviewed: 03/27/2007 [facility] Patient Information 2014 [place], [place].   ________________________________________________________________________

## 2014-07-08 NOTE — Pre-Procedure Instructions (Signed)
EKG AND CXR WERE DONE TODAY PREOP AT [facility]. DR. Delma Post SAW PT PREOP TODAY FOR ANESTHESIA CONSULT.

## 2014-07-08 NOTE — Pre-Procedure Instructions (Signed)
PT'S PT, INR FAXED TO DR. MARTIN'S OFFICE - PT STILL ON COUMADIN - STATED HE WAS TO STOP 5 DAYS PREOP. ORDER IN EPIC TO REPEAT PT, INR ON ARRIVAL FOR SURGERY.

## 2014-07-08 NOTE — Anesthesia Preprocedure Evaluation (Addendum)
Anesthesia Evaluation  Patient identified by MRN, date of birth, ID band Patient awake    Reviewed: Allergy & Precautions, H&P , NPO status , Patient's Chart, lab work & pertinent test results  Airway Mallampati: II TM Distance: >3 FB Neck ROM: Full    Dental no notable dental hx. (+) Teeth Intact, Dental Advisory Given   Pulmonary neg pulmonary ROS, shortness of breath, sleep apnea and Continuous Positive Airway Pressure Ventilation , PE breath sounds clear to auscultation  Pulmonary exam normal       Cardiovascular Exercise Tolerance: Good hypertension, Pt. on medications + Peripheral Vascular Disease negative cardio ROS  Rhythm:Regular Rate:Normal  H/O DVT, S/p recent IVC filter placement.  ECG: NSR, low voltage.   Neuro/Psych PSYCHIATRIC DISORDERS Anxiety Depression  Neuromuscular disease negative neurological ROS  negative psych ROS   GI/Hepatic negative GI ROS, Neg liver ROS, GERD-  Medicated and Controlled,  Endo/Other  negative endocrine ROSdiabetes, Well Controlled, Type 2, Oral Hypoglycemic Agents, Insulin DependentMorbid obesity  Renal/GU negative Renal ROS  negative genitourinary   Musculoskeletal negative musculoskeletal ROS (+)   Abdominal (+) + obese,   Peds negative pediatric ROS (+)  Hematology negative hematology ROS (+)   Anesthesia Other Findings   Reproductive/Obstetrics negative OB ROS                       Anesthesia Physical Anesthesia Plan  ASA: IV  Anesthesia Plan: General   Post-op Pain Management:    Induction: Intravenous  Airway Management Planned: Oral ETT and Video Laryngoscope Planned  Additional Equipment:   Intra-op Plan:   Post-operative Plan: Extubation in OR  Informed Consent: I have reviewed the patients History and Physical, chart, labs and discussed the procedure including the risks, benefits and alternatives for the proposed anesthesia with  the patient or authorized representative who has indicated his/her understanding and acceptance.   Dental advisory given  Plan Discussed with: CRNA and Surgeon  Anesthesia Plan Comments:        Anesthesia Quick Evaluation

## 2014-07-09 NOTE — Pre-Procedure Instructions (Signed)
BARI BED , OVER HEAD TRAPEZE REQUESTED FOR DAY OF SURGERY WITH AMY IN PORTABLE EQUIPMENT AND ORDERS IN EPIC

## 2014-07-12 NOTE — Progress Notes (Signed)
Dr. Hassell Done - Please enter preop orders in Epic for Joseph [name] - surgery date is Monday  10/19.

## 2014-07-15 ENCOUNTER — Encounter (HOSPITAL_COMMUNITY): Payer: Medicare Other | Admitting: Anesthesiology

## 2014-07-15 ENCOUNTER — Encounter (HOSPITAL_COMMUNITY): Admission: RE | Payer: Self-pay | Source: Ambulatory Visit

## 2014-07-15 ENCOUNTER — Encounter (HOSPITAL_COMMUNITY): Payer: Self-pay | Admitting: *Deleted

## 2014-07-15 ENCOUNTER — Inpatient Hospital Stay (HOSPITAL_COMMUNITY): Admission: RE | Admit: 2014-07-15 | Payer: Medicare Other | Source: Ambulatory Visit | Admitting: Surgery

## 2014-07-15 ENCOUNTER — Encounter (HOSPITAL_COMMUNITY)
Admission: RE | Disposition: A | Discharge status: Home / Self Care | Payer: Self-pay | Source: Ambulatory Visit | Attending: Surgery

## 2014-07-15 ENCOUNTER — Observation Stay (HOSPITAL_COMMUNITY)
Admission: RE | Admit: 2014-07-15 | Discharge: 2014-07-17 | Disposition: A | Discharge status: Home / Self Care | Payer: Medicare Other | Source: Ambulatory Visit | Attending: Surgery | Admitting: Surgery

## 2014-07-15 ENCOUNTER — Inpatient Hospital Stay (HOSPITAL_COMMUNITY): Payer: Medicare Other | Admitting: Anesthesiology

## 2014-07-15 DIAGNOSIS — Z794 Long term (current) use of insulin: Secondary | ICD-10-CM | POA: Diagnosis not present

## 2014-07-15 DIAGNOSIS — I10 Essential (primary) hypertension: Secondary | ICD-10-CM | POA: Insufficient documentation

## 2014-07-15 DIAGNOSIS — E780 Pure hypercholesterolemia: Secondary | ICD-10-CM | POA: Insufficient documentation

## 2014-07-15 DIAGNOSIS — G4733 Obstructive sleep apnea (adult) (pediatric): Secondary | ICD-10-CM | POA: Diagnosis not present

## 2014-07-15 DIAGNOSIS — M793 Panniculitis, unspecified: Principal | ICD-10-CM | POA: Insufficient documentation

## 2014-07-15 DIAGNOSIS — K219 Gastro-esophageal reflux disease without esophagitis: Secondary | ICD-10-CM | POA: Insufficient documentation

## 2014-07-15 DIAGNOSIS — Z9889 Other specified postprocedural states: Secondary | ICD-10-CM

## 2014-07-15 DIAGNOSIS — I739 Peripheral vascular disease, unspecified: Secondary | ICD-10-CM | POA: Diagnosis not present

## 2014-07-15 DIAGNOSIS — Z6841 Body Mass Index (BMI) 40.0 and over, adult: Secondary | ICD-10-CM | POA: Insufficient documentation

## 2014-07-15 DIAGNOSIS — I8390 Asymptomatic varicose veins of unspecified lower extremity: Secondary | ICD-10-CM | POA: Insufficient documentation

## 2014-07-15 DIAGNOSIS — F419 Anxiety disorder, unspecified: Secondary | ICD-10-CM | POA: Diagnosis not present

## 2014-07-15 DIAGNOSIS — Z79899 Other long term (current) drug therapy: Secondary | ICD-10-CM | POA: Diagnosis not present

## 2014-07-15 DIAGNOSIS — Z86711 Personal history of pulmonary embolism: Secondary | ICD-10-CM | POA: Diagnosis not present

## 2014-07-15 DIAGNOSIS — E119 Type 2 diabetes mellitus without complications: Secondary | ICD-10-CM | POA: Diagnosis not present

## 2014-07-15 DIAGNOSIS — F329 Major depressive disorder, single episode, unspecified: Secondary | ICD-10-CM | POA: Insufficient documentation

## 2014-07-15 HISTORY — PX: PANNICULECTOMY: SHX5360

## 2014-07-15 LAB — TYPE AND SCREEN
ABO/RH(D): A POS
Antibody Screen: NEGATIVE

## 2014-07-15 LAB — GLUCOSE, CAPILLARY
GLUCOSE-CAPILLARY: 234 mg/dL — AB (ref 70–99)
GLUCOSE-CAPILLARY: 283 mg/dL — AB (ref 70–99)
Glucose-Capillary: 226 mg/dL — ABNORMAL HIGH (ref 70–99)
Glucose-Capillary: 204 mg/dL — ABNORMAL HIGH (ref 70–99)

## 2014-07-15 LAB — CBC
HCT: 39.5 % (ref 39.0–52.0)
HEMOGLOBIN: 12.2 g/dL — AB (ref 13.0–17.0)
MCH: 27.4 pg (ref 26.0–34.0)
MCHC: 30.9 g/dL (ref 30.0–36.0)
MCV: 88.6 fL (ref 78.0–100.0)
Platelets: 318 10*3/uL (ref 150–400)
RBC: 4.46 MIL/uL (ref 4.22–5.81)
RDW: 15.1 % (ref 11.5–15.5)
WBC: 17.4 10*3/uL — AB (ref 4.0–10.5)

## 2014-07-15 LAB — PROTIME-INR
INR: 1.17 (ref 0.00–1.49)
PROTHROMBIN TIME: 15.1 s (ref 11.6–15.2)

## 2014-07-15 LAB — CREATININE, SERUM
CREATININE: 1.5 mg/dL — AB (ref 0.50–1.35)
GFR calc Af Amer: 58 mL/min — ABNORMAL LOW (ref >90)
GFR, EST NON AFRICAN AMERICAN: 50 mL/min — AB (ref >90)

## 2014-07-15 LAB — HEMOGLOBIN A1C
HEMOGLOBIN A1C: 8 % — AB (ref <5.7)
MEAN PLASMA GLUCOSE: 183 mg/dL — AB (ref <117)

## 2014-07-15 SURGERY — PANNICULECTOMY
Anesthesia: General

## 2014-07-15 MED ORDER — INSULIN ASPART 100 UNIT/ML ~~LOC~~ SOLN: 0.0000 [IU] | SUBCUTANEOUS | Status: DC

## 2014-07-15 MED ORDER — LACTATED RINGERS IV SOLN
INTRAVENOUS | Status: DC | PRN
  Administered 2014-07-15 (×3): via INTRAVENOUS

## 2014-07-15 MED ORDER — DEXTROSE 5 % IV SOLN
2.0000 g | Freq: Once | INTRAVENOUS | Status: AC
  Administered 2014-07-15: 2 g via INTRAVENOUS

## 2014-07-15 MED ORDER — MIDAZOLAM HCL 2 MG/2ML IJ SOLN
INTRAMUSCULAR | Status: AC
  Filled 2014-07-15: qty 2

## 2014-07-15 MED ORDER — FENTANYL CITRATE 0.05 MG/ML IJ SOLN
INTRAMUSCULAR | Status: AC
  Filled 2014-07-15: qty 5

## 2014-07-15 MED ORDER — INSULIN ASPART 100 UNIT/ML ~~LOC~~ SOLN
SUBCUTANEOUS | Status: AC
  Filled 2014-07-15: qty 1

## 2014-07-15 MED ORDER — KCL IN DEXTROSE-NACL 20-5-0.45 MEQ/L-%-% IV SOLN
INTRAVENOUS | Status: DC
  Administered 2014-07-15 – 2014-07-16 (×2): via INTRAVENOUS
  Filled 2014-07-15 (×4): qty 1000

## 2014-07-15 MED ORDER — FENTANYL CITRATE 0.05 MG/ML IJ SOLN
INTRAMUSCULAR | Status: DC | PRN
Start: 2014-07-15 — End: 2014-07-15
  Administered 2014-07-15 (×4): 50 ug via INTRAVENOUS

## 2014-07-15 MED ORDER — ONDANSETRON HCL 4 MG/2ML IJ SOLN
INTRAMUSCULAR | Status: DC | PRN
  Administered 2014-07-15: 4 mg via INTRAVENOUS

## 2014-07-15 MED ORDER — PROPOFOL 10 MG/ML IV BOLUS
INTRAVENOUS | Status: DC | PRN
  Administered 2014-07-15: 200 mg via INTRAVENOUS

## 2014-07-15 MED ORDER — HYDROMORPHONE HCL 1 MG/ML IJ SOLN: 0.5000 mg | INTRAMUSCULAR | Status: DC | PRN

## 2014-07-15 MED ORDER — ONDANSETRON HCL 4 MG PO TABS: 4.0000 mg | ORAL_TABLET | Freq: Four times a day (QID) | ORAL | Status: DC | PRN

## 2014-07-15 MED ORDER — LABETALOL HCL 5 MG/ML IV SOLN
INTRAVENOUS | Status: AC
  Administered 2014-07-15: 5 mg via INTRAVENOUS
  Filled 2014-07-15: qty 4

## 2014-07-15 MED ORDER — HYDROMORPHONE HCL 1 MG/ML IJ SOLN: 0.2500 mg | INTRAMUSCULAR | Status: DC | PRN

## 2014-07-15 MED ORDER — PROPOFOL 10 MG/ML IV BOLUS
INTRAVENOUS | Status: AC
  Filled 2014-07-15: qty 20

## 2014-07-15 MED ORDER — INSULIN ASPART 100 UNIT/ML ~~LOC~~ SOLN
0.0000 [IU] | SUBCUTANEOUS | Status: DC
  Administered 2014-07-15: 7 [IU] via SUBCUTANEOUS
  Administered 2014-07-15: 11 [IU] via SUBCUTANEOUS
  Administered 2014-07-15: 7 [IU] via SUBCUTANEOUS
  Administered 2014-07-16: 11 [IU] via SUBCUTANEOUS
  Administered 2014-07-16: 4 [IU] via SUBCUTANEOUS
  Administered 2014-07-16: 3 [IU] via SUBCUTANEOUS
  Administered 2014-07-16: 11 [IU] via SUBCUTANEOUS
  Administered 2014-07-16: 15 [IU] via SUBCUTANEOUS
  Administered 2014-07-16: 3 [IU] via SUBCUTANEOUS
  Administered 2014-07-17: 7 [IU] via SUBCUTANEOUS

## 2014-07-15 MED ORDER — LABETALOL HCL 5 MG/ML IV SOLN
5.0000 mg | INTRAVENOUS | Status: AC | PRN
  Administered 2014-07-15 (×4): 5 mg via INTRAVENOUS

## 2014-07-15 MED ORDER — MIDAZOLAM HCL 5 MG/5ML IJ SOLN
INTRAMUSCULAR | Status: DC | PRN
  Administered 2014-07-15: 2 mg via INTRAVENOUS

## 2014-07-15 MED ORDER — HEPARIN SODIUM (PORCINE) 5000 UNIT/ML IJ SOLN
5000.0000 [IU] | Freq: Once | INTRAMUSCULAR | Status: AC
  Administered 2014-07-15: 5000 [IU] via SUBCUTANEOUS
  Filled 2014-07-15: qty 1

## 2014-07-15 MED ORDER — CISATRACURIUM BESYLATE 20 MG/10ML IV SOLN
INTRAVENOUS | Status: AC
  Filled 2014-07-15: qty 10

## 2014-07-15 MED ORDER — BACITRACIN-NEOMYCIN-POLYMYXIN OINTMENT TUBE
TOPICAL_OINTMENT | CUTANEOUS | Status: DC | PRN
  Administered 2014-07-15: 1 via TOPICAL

## 2014-07-15 MED ORDER — CISATRACURIUM BESYLATE (PF) 10 MG/5ML IV SOLN
INTRAVENOUS | Status: DC | PRN
  Administered 2014-07-15 (×2): 2 mg via INTRAVENOUS
  Administered 2014-07-15: 6 mg via INTRAVENOUS

## 2014-07-15 MED ORDER — DEXTROSE 5 % IV SOLN
1.0000 g | Freq: Four times a day (QID) | INTRAVENOUS | Status: AC
  Administered 2014-07-15 – 2014-07-16 (×3): 1 g via INTRAVENOUS
  Filled 2014-07-15 (×3): qty 1

## 2014-07-15 MED ORDER — HEPARIN SODIUM (PORCINE) 5000 UNIT/ML IJ SOLN
5000.0000 [IU] | Freq: Three times a day (TID) | INTRAMUSCULAR | Status: DC
  Administered 2014-07-15 – 2014-07-17 (×5): 5000 [IU] via SUBCUTANEOUS
  Filled 2014-07-15 (×9): qty 1

## 2014-07-15 MED ORDER — METOCLOPRAMIDE HCL 5 MG/ML IJ SOLN
INTRAMUSCULAR | Status: AC
  Filled 2014-07-15: qty 2

## 2014-07-15 MED ORDER — HYDROCODONE-ACETAMINOPHEN 5-325 MG PO TABS
1.0000 | ORAL_TABLET | ORAL | Status: DC | PRN
  Administered 2014-07-15 – 2014-07-17 (×4): 1 via ORAL
  Filled 2014-07-15 (×4): qty 1

## 2014-07-15 MED ORDER — ONDANSETRON HCL 4 MG/2ML IJ SOLN: 4.0000 mg | Freq: Four times a day (QID) | INTRAMUSCULAR | Status: DC | PRN

## 2014-07-15 MED ORDER — ONDANSETRON HCL 4 MG/2ML IJ SOLN
INTRAMUSCULAR | Status: AC
  Filled 2014-07-15: qty 2

## 2014-07-15 MED ORDER — 0.9 % SODIUM CHLORIDE (POUR BTL) OPTIME
TOPICAL | Status: DC | PRN
  Administered 2014-07-15: 3000 mL

## 2014-07-15 MED ORDER — DEXTROSE 5 % IV SOLN
INTRAVENOUS | Status: AC
  Filled 2014-07-15: qty 2

## 2014-07-15 MED ORDER — SODIUM CHLORIDE 0.9 % IJ SOLN
INTRAMUSCULAR | Status: AC
  Filled 2014-07-15: qty 3

## 2014-07-15 MED ORDER — METOCLOPRAMIDE HCL 5 MG/ML IJ SOLN
INTRAMUSCULAR | Status: DC | PRN
  Administered 2014-07-15: 10 mg via INTRAVENOUS

## 2014-07-15 MED ORDER — LACTATED RINGERS IV SOLN: INTRAVENOUS | Status: DC

## 2014-07-15 MED ORDER — INFLUENZA VAC SPLIT QUAD 0.5 ML IM SUSY
0.5000 mL | PREFILLED_SYRINGE | INTRAMUSCULAR | Status: AC
  Administered 2014-07-16: 0.5 mL via INTRAMUSCULAR
  Filled 2014-07-15 (×2): qty 0.5

## 2014-07-15 MED ORDER — INSULIN GLARGINE 100 UNIT/ML ~~LOC~~ SOLN
10.0000 [IU] | Freq: Every day | SUBCUTANEOUS | Status: DC
  Administered 2014-07-15: 10 [IU] via SUBCUTANEOUS
  Filled 2014-07-15 (×2): qty 0.1

## 2014-07-15 MED ORDER — SUCCINYLCHOLINE CHLORIDE 20 MG/ML IJ SOLN
INTRAMUSCULAR | Status: DC | PRN
  Administered 2014-07-15: 150 mg via INTRAVENOUS

## 2014-07-15 SURGICAL SUPPLY — 50 items
APPLICATOR COTTON TIP 6IN STRL (MISCELLANEOUS) ×4 IMPLANT
BINDER ABD UNIV 12 45-62 (WOUND CARE) ×2 IMPLANT
BINDER ABDOMINAL 46IN 62IN (WOUND CARE) ×4
BLADE EXTENDED COATED 6.5IN (ELECTRODE) ×4 IMPLANT
BLADE HEX COATED 2.75 (ELECTRODE) ×4 IMPLANT
BLADE SURG 15 STRL LF DISP TIS (BLADE) IMPLANT
BLADE SURG 15 STRL SS (BLADE)
BLADE SURG SZ10 CARB STEEL (BLADE) ×4 IMPLANT
CANISTER SUCTION 2500CC (MISCELLANEOUS) IMPLANT
CLIP TI LARGE 6 (CLIP) ×2 IMPLANT
COUNTER NEEDLE 20 DBL MAG RED (NEEDLE) ×2 IMPLANT
COVER MAYO STAND STRL (DRAPES) IMPLANT
DRAPE LG THREE QUARTER DISP (DRAPES) ×6 IMPLANT
DRAPE UNIVERSAL PACK (DRAPES) IMPLANT
DRAPE WARM FLUID 44X44 (DRAPE) ×2 IMPLANT
DRSG PAD ABDOMINAL 8X10 ST (GAUZE/BANDAGES/DRESSINGS) ×8 IMPLANT
DRSG TELFA 3X8 NADH (GAUZE/BANDAGES/DRESSINGS) ×2 IMPLANT
ELECT REM PT RETURN 9FT ADLT (ELECTROSURGICAL) ×4
ELECTRODE REM PT RTRN 9FT ADLT (ELECTROSURGICAL) ×2 IMPLANT
GAUZE SPONGE 4X4 12PLY STRL (GAUZE/BANDAGES/DRESSINGS) ×2 IMPLANT
GLOVE BIOGEL M 8.0 STRL (GLOVE) ×2 IMPLANT
GLOVE BIOGEL PI IND STRL 7.0 (GLOVE) ×5 IMPLANT
GLOVE BIOGEL PI INDICATOR 7.0 (GLOVE) ×5
GOWN SPEC L4 XLG W/TWL (GOWN DISPOSABLE) IMPLANT
GOWN STRL REUS W/TWL LRG LVL3 (GOWN DISPOSABLE) IMPLANT
GOWN STRL REUS W/TWL XL LVL3 (GOWN DISPOSABLE) ×8 IMPLANT
HOVERMATT SINGLE USE (MISCELLANEOUS) ×2 IMPLANT
KIT BASIN OR (CUSTOM PROCEDURE TRAY) ×2 IMPLANT
LIGASURE IMPACT 36 18CM CVD LR (INSTRUMENTS) ×4 IMPLANT
MANIFOLD NEPTUNE II (INSTRUMENTS) ×4 IMPLANT
NS IRRIG 1000ML POUR BTL (IV SOLUTION) ×4 IMPLANT
PACK CARDIOVASCULAR III (CUSTOM PROCEDURE TRAY) ×2 IMPLANT
PAD ABD 8X10 STRL (GAUZE/BANDAGES/DRESSINGS) ×14 IMPLANT
PENCIL BUTTON HOLSTER BLD 10FT (ELECTRODE) ×4 IMPLANT
SCRUB PCMX 4 OZ (MISCELLANEOUS) ×8 IMPLANT
SPONGE LAP 18X18 X RAY DECT (DISPOSABLE) ×8 IMPLANT
STAPLER VISISTAT 35W (STAPLE) ×4 IMPLANT
SUCTION POOLE TIP (SUCTIONS) IMPLANT
SUT ETHILON 2 0 PSLX (SUTURE) ×16 IMPLANT
SUT NYLON 3 0 (SUTURE) ×16 IMPLANT
SUT SILK 2 0 (SUTURE)
SUT SILK 2-0 18XBRD TIE 12 (SUTURE) IMPLANT
SUT VIC AB 2-0 SH 18 (SUTURE) ×22 IMPLANT
SUT VIC AB 4-0 SH 18 (SUTURE) ×2 IMPLANT
SYR BULB IRRIGATION 50ML (SYRINGE) ×2 IMPLANT
TAPE CLOTH SURG 6X10 WHT LF (GAUZE/BANDAGES/DRESSINGS) ×2 IMPLANT
TOWEL OR 17X26 10 PK STRL BLUE (TOWEL DISPOSABLE) ×4 IMPLANT
TRAY FOLEY CATH 14FRSI W/METER (CATHETERS) IMPLANT
YANKAUER SUCT BULB TIP 10FT TU (MISCELLANEOUS) ×4 IMPLANT
YANKAUER SUCT BULB TIP NO VENT (SUCTIONS) IMPLANT

## 2014-07-15 NOTE — Op Note (Signed)
Surgeon: Kaylyn Lim, MD, FACS  Asst:  Adonis Housekeeper, MD, FACS  Anes:  General 4 hours  Procedure: Panniculectomy  27.5 lbs  Diagnosis: Severe brawny edematous panniculitis  Complications: none  EBL:   400 cc  Drains: none  Description of Procedure:  The patient was taken to OR 1 at [facility].  After anesthesia was administered and the patient was prepped a timeout was performed.  The prep Was extensive and included a chlorhexidine wash prior to PCMX prepping and draping. The patient had a massive brawny edematous pannus with skin breakdown and healing areas. I elected to marked laterally for a lead gigantic the lips that encompassed this that was below the umbilicus. The incision was made beginning the demarcation and a stuck to the except on the left inferior lateral side after we completed the panniculectomy I removed an additional pounds of skin and fat.  The patient's penis was significantly retracted up and the mons fat pad and 2 protect it I had to maintain finger inside on top of the shaft as we came down removing the pannus since he was so distorted by the chronic nature of the fat necrosis and scarring. The deep dissection worked really from cephalad down toward and then we can wean him from below upward until we had isolated they middle pedicle and excised that using the ligature. Dr. Excell Seltzer and I used 2 ligatures to accomplish the surgery. It took 4 hours and after the 27-1/2 pound panniculus was removed we then closed in layers using a 2-0 Vicryl and then subcutaneous 2-0 Vicryl followed by a mini interrupted vertical mattress sutures of 2-0 nylon on the skin. A abdominal binder was applied after Neosporin and Telfa were applied to the incision line. The patient seemed to tolerate the procedure well was taken to the recovery room in satisfactory condition.  The patient tolerated the procedure well and was taken to the PACU in stable condition.     Joseph [name] Done, [place], [facility] Surgery, [place]

## 2014-07-15 NOTE — Transfer of Care (Signed)
Immediate Anesthesia Transfer of Care Note  Patient: Joseph [name].  Procedure(s) Performed: Procedure(s): PANNICULECTOMY (N/A)  Patient Location: PACU  Anesthesia Type:General  Level of Consciousness: awake, alert , sedated and patient cooperative  Airway & Oxygen Therapy: Patient Spontanous Breathing and Patient connected to face mask oxygen  Post-op Assessment: Report given to PACU RN and Post -op Vital signs reviewed and stable  Post vital signs: Reviewed and stable  Complications: No apparent anesthesia complications

## 2014-07-15 NOTE — Progress Notes (Signed)
Give Heparin as ordered today ( INR 2.41 redrawing this am)

## 2014-07-15 NOTE — Anesthesia Postprocedure Evaluation (Signed)
  Anesthesia Post-op Note  Patient: Joseph [name].  Procedure(s) Performed: Procedure(s) (LRB): PANNICULECTOMY (N/A)  Patient Location: PACU  Anesthesia Type: General  Level of Consciousness: awake and alert   Airway and Oxygen Therapy: Patient Spontanous Breathing  Post-op Pain: mild  Post-op Assessment: Post-op Vital signs reviewed, Patient's Cardiovascular Status Stable, Respiratory Function Stable, Patent Airway and No signs of Nausea or vomiting  Last Vitals:  Filed Vitals:   07/15/14 1415  BP: 168/48  Pulse: 75  Temp: 36.5 C  Resp: 20    Post-op Vital Signs: stable   Complications: No apparent anesthesia complications

## 2014-07-15 NOTE — H&P (Signed)
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 is an 58 y.o. male who presents for mobility issues and morbid obesity with brawny edema in a massive abdominal pannus that hangs near his knees. He is desparate. In our discussion of panniculitis we discussed bariatric surgery. I explained sleeve gastrectomy in detail and would consider staging him by performing the sleeve gastrectomy first and or combined with panniculectomy.He rejected the idea of sleeve gastrectomy or any bariatric intervention and wants to pursue the panniculectomy alone.  This has been approved.  I discussed this with him in detail including the risks and discussed preop placement of a [place] filter which has been done.  Will proceed with panniculectomy for massive pannus.  He has been off his coumadin for 5 days.   Past Medical History   Diagnosis  Date   .  Morbid obesity    .  Pure hypercholesterolemia    .  Essential hypertension, benign    .  Type II or unspecified type diabetes mellitus without mention of complication, uncontrolled    .  Esophageal reflux    .  Myalgia and myositis, unspecified    .  Unspecified sleep apnea    .  Type II or unspecified type diabetes mellitus without mention of complication, not stated as uncontrolled    .  Anxiety state, unspecified    .  Essential hypertension, benign    .  Varicose veins of lower extremities with inflammation    .  Peripheral vascular disease, unspecified    .  Edema    .  Obstructive sleep apnea (adult) (pediatric)    .  Type II or unspecified type diabetes mellitus with neurological manifestations, not stated as uncontrolled    .  Polyneuropathy in diabetes(357.2)    .  Personal history of PE (pulmonary embolism)    .  Depression     Past Surgical History   Procedure  Laterality  Date   .  Cataracts      Current Outpatient Prescriptions   Medication  Sig  Dispense  Refill   .  amLODipine  (NORVASC) 5 MG tablet  Take 5 mg by mouth daily.     .  clindamycin (CLEOCIN) 150 MG capsule  Take 2 capsules (300 mg total) by mouth every 6 (six) hours.  56 capsule  0   .  FLUoxetine (PROZAC) 40 MG capsule  Take 40 mg by mouth daily.     .  furosemide (LASIX) 20 MG tablet  Take 20 mg by mouth.     .  furosemide (LASIX) 20 MG tablet  1 tablet     .  indapamide (LOZOL) 2.5 MG tablet  Take 2.5 mg by mouth 2 (two) times daily.     .  insulin glargine (LANTUS) 100 UNIT/ML injection  Inject 50 Units into the skin at bedtime.     .  insulin lispro (HUMALOG) 100 UNIT/ML injection  Inject 15-20 Units into the skin 3 (three) times daily before meals. Per sliding scale     .  metFORMIN (GLUCOPHAGE) 1000 MG tablet  Take 1,000 mg by mouth 2 (two) times daily with a meal.     .  OVER THE COUNTER MEDICATION  Take 1 tablet by mouth 3 (three) times daily. Over the counter antacid from [facility]     .  pioglitazone (ACTOS) 45 MG tablet  Take 45 mg by mouth daily.     Marland Kitchen  pravastatin (PRAVACHOL) 40 MG tablet  Take 40 mg by mouth daily.     .  quinapril (ACCUPRIL) 20 MG tablet  Take 20 mg by mouth daily.     Marland Kitchen  warfarin (COUMADIN) 5 MG tablet  Take 5-7.5 mg by mouth at bedtime. Except Monday and Friday 7.5mg       No current facility-administered medications for this visit.   Review of patient's allergies indicates no known allergies.  No family history on file.  Social History: has no tobacco, alcohol, and drug history on file.  REVIEW OF SYSTEMS :  Positive for many things referable to DM ; otherwise negative  Physical Exam:  Blood pressure 134/86, pulse 80, temperature 97.8 F (36.6 C), temperature source Temporal, resp. rate 18, height 6\' 1"  (1.854 m), weight 491 lb 9.6 oz (222.988 kg).  Body mass index is 64.87 kg/(m^2).  Gen: WDWN WM NAD  Neurological: Alert and oriented to person, place, and time. Motor and sensory function is grossly intact  Head: Normocephalic and atraumatic.  Eyes: Conjunctivae  are normal. Pupils are equal, round, and reactive to light. No scleral icterus.  Neck: Normal range of motion. Neck supple. No tracheal deviation or thyromegaly present.  Cardiovascular: SR without murmurs or gallops. No carotid bruits  Breast: Not examined  Respiratory: Effort normal. No respiratory distress. No chest wall tenderness. Breath sounds normal. No wheezes, rales or rhonchi.  Abdomen: Huge pannus with brawny edema radiating onto the legs  GU: Shrunken genitalia retracted  Musculoskeletal: Normal range of motion. Extremities are nontender. No cyanosis, edema or clubbing noted Lymphadenopathy: No cervical, preauricular, postauricular or axillary adenopathy is present Skin: Skin is warm and dry. No rash noted. No diaphoresis. No erythema. No pallor. Pscyh: Normal mood and affect. Behavior is normal. Judgment and thought content normal.  LABORATORY RESULTS:  No results found for this or any previous visit (from the past 48 hour(s)).  RADIOLOGY RESULTS:  No results found.  Problem List:  Patient Active Problem List    Diagnosis  Date Noted   .  Morbid obesity BMI 65  03/22/2014   .  Pure hypercholesterolemia  03/22/2014   .  Essential hypertension, benign  03/22/2014   .  Diabetes  03/22/2014   .  Panniculitis  03/22/2014   .  OSA (obstructive sleep apnea)  03/22/2014   .  Hx pulmonary embolism  03/22/2014   Assessment & Plan:  Super morbid obesity with massive abdominal pannus.  For abdominal panniculectomy today.  He is aware of the risks of this surgery and he accepts these risks.   Matt B. Hassell Done, MD, [facility]  [phone] beeper  [phone]

## 2014-07-15 NOTE — Interval H&P Note (Signed)
History and Physical Interval Note:  07/15/2014 7:30 AM  Joseph [name].  has presented today for surgery, with the diagnosis of Morbid Obesity  The various methods of treatment have been discussed with the patient and family. After consideration of risks, benefits and other options for treatment, the patient has consented to  Procedure(s): PANNICULECTOMY (N/A) as a surgical intervention .  The patient's history has been reviewed, patient examined, no change in status, stable for surgery.  I have reviewed the patient's chart and labs.  Questions were answered to the patient's satisfaction.     Joseph [name] B

## 2014-07-16 ENCOUNTER — Encounter (HOSPITAL_COMMUNITY): Payer: Self-pay | Admitting: Surgery

## 2014-07-16 DIAGNOSIS — M793 Panniculitis, unspecified: Secondary | ICD-10-CM | POA: Diagnosis not present

## 2014-07-16 LAB — CBC
HEMATOCRIT: 31.9 % — AB (ref 39.0–52.0)
Hemoglobin: 10.4 g/dL — ABNORMAL LOW (ref 13.0–17.0)
MCH: 28 pg (ref 26.0–34.0)
MCHC: 32.6 g/dL (ref 30.0–36.0)
MCV: 85.8 fL (ref 78.0–100.0)
Platelets: 279 10*3/uL (ref 150–400)
RBC: 3.72 MIL/uL — ABNORMAL LOW (ref 4.22–5.81)
RDW: 15.2 % (ref 11.5–15.5)
WBC: 9.3 10*3/uL (ref 4.0–10.5)

## 2014-07-16 LAB — GLUCOSE, CAPILLARY
GLUCOSE-CAPILLARY: 136 mg/dL — AB (ref 70–99)
GLUCOSE-CAPILLARY: 150 mg/dL — AB (ref 70–99)
GLUCOSE-CAPILLARY: 161 mg/dL — AB (ref 70–99)
Glucose-Capillary: 211 mg/dL — ABNORMAL HIGH (ref 70–99)
Glucose-Capillary: 280 mg/dL — ABNORMAL HIGH (ref 70–99)
Glucose-Capillary: 289 mg/dL — ABNORMAL HIGH (ref 70–99)
Glucose-Capillary: 345 mg/dL — ABNORMAL HIGH (ref 70–99)

## 2014-07-16 LAB — BASIC METABOLIC PANEL
Anion gap: 14 (ref 5–15)
BUN: 22 mg/dL (ref 6–23)
CO2: 24 mEq/L (ref 19–32)
CREATININE: 1.2 mg/dL (ref 0.50–1.35)
Calcium: 7.9 mg/dL — ABNORMAL LOW (ref 8.4–10.5)
Chloride: 104 mEq/L (ref 96–112)
GFR calc Af Amer: 76 mL/min — ABNORMAL LOW (ref >90)
GFR, EST NON AFRICAN AMERICAN: 65 mL/min — AB (ref >90)
Glucose, Bld: 168 mg/dL — ABNORMAL HIGH (ref 70–99)
POTASSIUM: 4.2 meq/L (ref 3.7–5.3)
Sodium: 142 mEq/L (ref 137–147)

## 2014-07-16 MED ORDER — INSULIN GLARGINE 100 UNIT/ML ~~LOC~~ SOLN
50.0000 [IU] | Freq: Every day | SUBCUTANEOUS | Status: DC
  Administered 2014-07-16: 50 [IU] via SUBCUTANEOUS
  Filled 2014-07-16: qty 0.5

## 2014-07-16 MED ORDER — INSULIN ASPART 100 UNIT/ML ~~LOC~~ SOLN
25.0000 [IU] | Freq: Once | SUBCUTANEOUS | Status: AC
  Administered 2014-07-16: 25 [IU] via SUBCUTANEOUS

## 2014-07-16 MED ORDER — METFORMIN HCL 500 MG PO TABS
1000.0000 mg | ORAL_TABLET | Freq: Two times a day (BID) | ORAL | Status: DC
  Administered 2014-07-17: 1000 mg via ORAL
  Filled 2014-07-16 (×3): qty 2

## 2014-07-16 MED ORDER — PIOGLITAZONE HCL 45 MG PO TABS
45.0000 mg | ORAL_TABLET | Freq: Every day | ORAL | Status: DC
  Administered 2014-07-16 – 2014-07-17 (×2): 45 mg via ORAL
  Filled 2014-07-16 (×2): qty 1

## 2014-07-16 NOTE — Progress Notes (Signed)
Patient ID: Joseph Amano., male   DOB: Mar 29, 1956, 58 y.o.   MRN: [id number] [facility] Progress Note:   1 Day Post-Op  Subjective: Mental status is clear.  Slept well with CPAP Objective: Vital signs in last 24 hours: Temp:  [97.7 F (36.5 C)-99.5 F (37.5 C)] 98.8 F (37.1 C) (10/20 1035) Pulse Rate:  [69-92] 92 (10/20 1035) Resp:  [18-20] 18 (10/20 1035) BP: (152-197)/(35-49) 177/49 mmHg (10/20 1035) SpO2:  [93 %-100 %] 93 % (10/20 1035) Weight:  [486 lb 1.1 oz (220.48 kg)] 486 lb 1.1 oz (220.48 kg) (10/19 1615)  Intake/Output from previous day: 10/19 0701 - 10/20 0700 In: 4125.8 [P.O.:720; I.V.:3355.8; IV Piggyback:50] Out: 1700 [Urine:1200; Blood:500] Intake/Output this shift: Total I/O In: 200 [I.V.:200] Out: 325 [Urine:325]  Physical Exam: Work of breathing is not elevated;    Lab Results:  Results for orders placed during the hospital encounter of 07/15/14 (from the past 48 hour(s))  GLUCOSE, CAPILLARY     Status: Abnormal   Collection Time    07/15/14  5:51 AM      Result Value Ref Range   Glucose-Capillary 226 (*) 70 - 99 mg/dL  PROTIME-INR     Status: None   Collection Time    07/15/14  6:48 AM      Result Value Ref Range   Prothrombin Time 15.1  11.6 - 15.2 seconds   INR 1.17  0.00 - 1.49  GLUCOSE, CAPILLARY     Status: Abnormal   Collection Time    07/15/14 12:47 PM      Result Value Ref Range   Glucose-Capillary 234 (*) 70 - 99 mg/dL  HEMOGLOBIN A1C     Status: Abnormal   Collection Time    07/15/14  1:10 PM      Result Value Ref Range   Hemoglobin A1C 8.0 (*) <5.7 %   Comment: (NOTE)                                                                               According to the ADA Clinical Practice Recommendations for 2011, when     HbA1c is used as a screening test:      >=6.5%   Diagnostic of Diabetes Mellitus               (if abnormal result is confirmed)     5.7-6.4%   Increased risk of developing Diabetes Mellitus   References:Diagnosis and Classification of Diabetes Mellitus,Diabetes     YWVP,7106,26(RSWNI 1):S62-S69 and Standards of Medical Care in             Diabetes - 2011,Diabetes Care,2011,34 (Suppl 1):S11-S61.   Mean Plasma Glucose 183 (*) <117 mg/dL   Comment: Performed at [facility]  CBC     Status: Abnormal   Collection Time    07/15/14  1:10 PM      Result Value Ref Range   WBC 17.4 (*) 4.0 - 10.5 K/uL   RBC 4.46  4.22 - 5.81 MIL/uL   Hemoglobin 12.2 (*) 13.0 - 17.0 g/dL   HCT 39.5  39.0 - 52.0 %   MCV 88.6  78.0 - 100.0 fL  MCH 27.4  26.0 - 34.0 pg   MCHC 30.9  30.0 - 36.0 g/dL   RDW 15.1  11.5 - 15.5 %   Platelets 318  150 - 400 K/uL  CREATININE, SERUM     Status: Abnormal   Collection Time    07/15/14  1:10 PM      Result Value Ref Range   Creatinine, Ser 1.50 (*) 0.50 - 1.35 mg/dL   GFR calc non Af Amer 50 (*) >90 mL/min   GFR calc Af Amer 58 (*) >90 mL/min   Comment: (NOTE)     The eGFR has been calculated using the CKD EPI equation.     This calculation has not been validated in all clinical situations.     eGFR's persistently <90 mL/min signify possible Chronic Kidney     Disease.  GLUCOSE, CAPILLARY     Status: Abnormal   Collection Time    07/15/14  4:24 PM      Result Value Ref Range   Glucose-Capillary 283 (*) 70 - 99 mg/dL  GLUCOSE, CAPILLARY     Status: Abnormal   Collection Time    07/15/14  8:07 PM      Result Value Ref Range   Glucose-Capillary 204 (*) 70 - 99 mg/dL  GLUCOSE, CAPILLARY     Status: Abnormal   Collection Time    07/16/14 12:06 AM      Result Value Ref Range   Glucose-Capillary 161 (*) 70 - 99 mg/dL  GLUCOSE, CAPILLARY     Status: Abnormal   Collection Time    07/16/14  4:07 AM      Result Value Ref Range   Glucose-Capillary 136 (*) 70 - 99 mg/dL  CBC     Status: Abnormal   Collection Time    07/16/14  5:50 AM      Result Value Ref Range   WBC 9.3  4.0 - 10.5 K/uL   RBC 3.72 (*) 4.22 - 5.81 MIL/uL   Hemoglobin 10.4 (*)  13.0 - 17.0 g/dL   HCT 31.9 (*) 39.0 - 52.0 %   MCV 85.8  78.0 - 100.0 fL   MCH 28.0  26.0 - 34.0 pg   MCHC 32.6  30.0 - 36.0 g/dL   RDW 15.2  11.5 - 15.5 %   Platelets 279  150 - 400 K/uL  BASIC METABOLIC PANEL     Status: Abnormal   Collection Time    07/16/14  5:50 AM      Result Value Ref Range   Sodium 142  137 - 147 mEq/L   Potassium 4.2  3.7 - 5.3 mEq/L   Chloride 104  96 - 112 mEq/L   CO2 24  19 - 32 mEq/L   Glucose, Bld 168 (*) 70 - 99 mg/dL   BUN 22  6 - 23 mg/dL   Creatinine, Ser 1.20  0.50 - 1.35 mg/dL   Calcium 7.9 (*) 8.4 - 10.5 mg/dL   GFR calc non Af Amer 65 (*) >90 mL/min   GFR calc Af Amer 76 (*) >90 mL/min   Comment: (NOTE)     The eGFR has been calculated using the CKD EPI equation.     This calculation has not been validated in all clinical situations.     eGFR's persistently <90 mL/min signify possible Chronic Kidney     Disease.   Anion gap 14  5 - 15  GLUCOSE, CAPILLARY     Status: Abnormal   Collection Time  07/16/14  7:53 AM      Result Value Ref Range   Glucose-Capillary 150 (*) 70 - 99 mg/dL  GLUCOSE, CAPILLARY     Status: Abnormal   Collection Time    07/16/14 11:35 AM      Result Value Ref Range   Glucose-Capillary 280 (*) 70 - 99 mg/dL    Radiology/Results: No results found.  Anti-infectives: Anti-infectives   Start     Dose/Rate Route Frequency Ordered Stop   07/15/14 1600  cefOXitin (MEFOXIN) 1 g in dextrose 5 % 50 mL IVPB     1 g 100 mL/hr over 30 Minutes Intravenous Every 6 hours 07/15/14 1438 07/16/14 0448   07/15/14 0615  cefOXitin (MEFOXIN) 2 g in dextrose 5 % 50 mL IVPB     2 g 100 mL/hr over 30 Minutes Intravenous  Once 07/15/14 0612 07/15/14 0740      Assessment/Plan: Problem List: Patient Active Problem List   Diagnosis Date Noted  . Morbid obesity BMI 65 03/22/2014  . Pure hypercholesterolemia 03/22/2014  . Essential hypertension, benign 03/22/2014  . Diabetes 03/22/2014  . Panniculitis 03/22/2014  . OSA  (obstructive sleep apnea) 03/22/2014  . Hx pulmonary embolism 03/22/2014    Stable thus far after panniculectomy 1 Day Post-Op    LOS: 1 day   Matt B. Hassell Done, MD, [facility] [phone] beeper [phone]  07/16/2014 1:45 PM

## 2014-07-16 NOTE — Progress Notes (Signed)
Spoke with Dr Hassell Done regarding patient earlier episode of bleeding at suture line.  Patient has no current bleeding.  To give heparin and told to change dressing instead of reinforcing

## 2014-07-16 NOTE — Progress Notes (Signed)
Pt up to chair noticed the patient had blood on the floor.  Dressing pulled back to check suture line which appears to be intact.  Dressing reinforced and binder reapplied.  Patient placed at window seat nursing student in with patient.  Joseph [name] made aware.  Asked to call Dr Hassell Done.  Dr Hassell Done paged per office.  Heparin held until speak with surgeon.  Will continue to monitor patient for bleeding.

## 2014-07-16 NOTE — Progress Notes (Signed)
UR completed 

## 2014-07-16 NOTE — Progress Notes (Signed)
Patient dressing changed with MD order.  Suture line intact some drainage noted from the left side of incision.  Non occlusive dressing placed at suture line then covered with 4x4 and abdominal pads.  Abdominal binder then placed around lower abdomen.

## 2014-07-17 DIAGNOSIS — Z9889 Other specified postprocedural states: Secondary | ICD-10-CM

## 2014-07-17 DIAGNOSIS — M793 Panniculitis, unspecified: Secondary | ICD-10-CM | POA: Diagnosis not present

## 2014-07-17 LAB — BASIC METABOLIC PANEL
ANION GAP: 12 (ref 5–15)
BUN: 19 mg/dL (ref 6–23)
CALCIUM: 8.1 mg/dL — AB (ref 8.4–10.5)
CO2: 25 mEq/L (ref 19–32)
Chloride: 101 mEq/L (ref 96–112)
Creatinine, Ser: 1 mg/dL (ref 0.50–1.35)
GFR calc Af Amer: >90 mL/min (ref >90)
GFR, EST NON AFRICAN AMERICAN: 82 mL/min — AB (ref >90)
Glucose, Bld: 156 mg/dL — ABNORMAL HIGH (ref 70–99)
Potassium: 4 mEq/L (ref 3.7–5.3)
SODIUM: 138 meq/L (ref 137–147)

## 2014-07-17 LAB — CBC
HCT: 31 % — ABNORMAL LOW (ref 39.0–52.0)
Hemoglobin: 10.2 g/dL — ABNORMAL LOW (ref 13.0–17.0)
MCH: 28.5 pg (ref 26.0–34.0)
MCHC: 32.9 g/dL (ref 30.0–36.0)
MCV: 86.6 fL (ref 78.0–100.0)
PLATELETS: 265 10*3/uL (ref 150–400)
RBC: 3.58 MIL/uL — ABNORMAL LOW (ref 4.22–5.81)
RDW: 15.4 % (ref 11.5–15.5)
WBC: 9.1 10*3/uL (ref 4.0–10.5)

## 2014-07-17 LAB — GLUCOSE, CAPILLARY
GLUCOSE-CAPILLARY: 137 mg/dL — AB (ref 70–99)
Glucose-Capillary: 212 mg/dL — ABNORMAL HIGH (ref 70–99)

## 2014-07-17 MED ORDER — ENOXAPARIN SODIUM 40 MG/0.4ML ~~LOC~~ SOLN
40.0000 mg | SUBCUTANEOUS | Status: DC
  Filled 2014-07-17: qty 0.4

## 2014-07-17 MED ORDER — ENOXAPARIN (LOVENOX) PATIENT EDUCATION KIT
PACK | Freq: Once | Status: AC
  Administered 2014-07-17: 10:00:00
  Filled 2014-07-17: qty 1

## 2014-07-17 MED ORDER — ENOXAPARIN SODIUM 40 MG/0.4ML ~~LOC~~ SOLN: 40.0000 mg | SUBCUTANEOUS | Status: DC

## 2014-07-17 MED ORDER — HYDROCODONE-ACETAMINOPHEN 5-325 MG PO TABS: 1.0000 | ORAL_TABLET | ORAL | Status: DC | PRN

## 2014-07-17 NOTE — Discharge Instructions (Signed)
Restart coumadin in 5 days.  Continue Lovenox as a bridge until primary care physician feels that you are orally anticoagulated Nurse to check daily

## 2014-07-17 NOTE — Plan of Care (Signed)
Problem: Phase II Progression Outcomes Goal: Return of bowel function (flatus, BM) IF ABDOMINAL SURGERY:  Outcome: Completed/Met Date Met:  07/17/14 Passing gas no BM

## 2014-07-17 NOTE — Progress Notes (Signed)
Patient discharge instructions and prescriptions given to patient and wife.  Patient and wife taught how to inject Lovenox.  Teach back performed questions answered

## 2014-07-17 NOTE — Discharge Summary (Signed)
Physician Discharge Summary  Patient ID: Joseph [name]. MRN: [id number] DOB/AGE: 1956/04/25 58 y.o.  Admit date: 07/15/2014 Discharge date: 07/17/2014  Admission Diagnoses:  panniculitis  Discharge Diagnoses:  same  Active Problems:   Panniculitis   Status post panniculectomy Oct 2015   Surgery:  panniculectomy  Discharged Condition: improved  Hospital Course:   Had surgery.  Kept for two days for antibiotics and observation.  Incison management will be best with home nursing assistance as patient at risk for complications both from bleeding into site and infection.    Consults: none  Significant Diagnostic Studies: none    Discharge Exam: Blood pressure 155/52, pulse 92, temperature 97.8 F (36.6 C), temperature source Oral, resp. rate 18, height 6\' 1"  (1.854 m), weight 486 lb 1.1 oz (220.48 kg), SpO2 95.00%. Incision dressed and covered  Disposition: 01-Home or Self Care  Discharge Instructions   Call MD for:  redness, tenderness, or signs of infection (pain, swelling, redness, odor or green/yellow discharge around incision site)    Complete by:  As directed      Call MD for:  severe uncontrolled pain    Complete by:  As directed      Call MD for:  temperature >100.4    Complete by:  As directed      Diet Carb Modified    Complete by:  As directed      Discharge instructions    Complete by:  As directed   Stay off coumadin for 5 days and resume under the supervision of your primary care.  Continue the Lovenox as a bridge as your anticoagulation is returning to your preop levels.   Daily shower or twice daily with dryoff using hairdryer and dressing with neosporin along the suture line with home health nurse to check and supervise. Get up and walk every hour.  Don't stay in bed.     Increase activity slowly    Complete by:  As directed             Medication List    STOP taking these medications       warfarin 5 MG tablet  Commonly known as:  COUMADIN       TAKE these medications       acetaminophen 500 MG tablet  Commonly known as:  TYLENOL  Take 1,000 mg by mouth once as needed.     amLODipine 5 MG tablet  Commonly known as:  NORVASC  Take 5 mg by mouth every morning.     enoxaparin 40 MG/0.4ML injection  Commonly known as:  LOVENOX  Inject 0.4 mLs (40 mg total) into the skin daily.     famotidine 20 MG tablet  Commonly known as:  PEPCID  Take 20 mg by mouth 2 (two) times daily.     FLUoxetine 40 MG capsule  Commonly known as:  PROZAC  Take 40 mg by mouth at bedtime.     furosemide 20 MG tablet  Commonly known as:  LASIX  Take 20 mg by mouth every morning.     HYDROcodone-acetaminophen 5-325 MG per tablet  Commonly known as:  NORCO/VICODIN  Take 1-2 tablets by mouth every 4 (four) hours as needed for moderate pain.     indapamide 2.5 MG tablet  Commonly known as:  LOZOL  Take 5 mg by mouth every morning.     insulin glargine 100 UNIT/ML injection  Commonly known as:  LANTUS  Inject 50 Units into the skin at bedtime.  insulin lispro 100 UNIT/ML injection  Commonly known as:  HUMALOG  Inject 15-20 Units into the skin 3 (three) times daily before meals. Per sliding scale     metFORMIN 1000 MG tablet  Commonly known as:  GLUCOPHAGE  Take 1,000 mg by mouth 2 (two) times daily with a meal.     pioglitazone 45 MG tablet  Commonly known as:  ACTOS  Take 45 mg by mouth daily.     pravastatin 40 MG tablet  Commonly known as:  PRAVACHOL  Take 40 mg by mouth at bedtime.     PRENATAL 1 + IRON PO  Take 1 tablet by mouth every morning.     quinapril 20 MG tablet  Commonly known as:  ACCUPRIL  Take 20 mg by mouth every morning.           Follow-up Information   Follow up with Johnathan Hausen B, MD In 2 weeks.   Specialty:  General Surgery   Contact information:   [address] [place] [address] [phone]       Signed: Pedro Earls 07/17/2014, 8:45 AM

## 2014-07-17 NOTE — Care Management Note (Signed)
    Page 1 of 1   07/17/2014     10:35:04 AM CARE MANAGEMENT NOTE 07/17/2014  Patient:  Joseph [name], Joseph [name]   Account Number:  [id number]  Date Initiated:  07/17/2014  Documentation initiated by:  Sunday Spillers  Subjective/Objective Assessment:   58 yo male admitted s/p pannulectomy. PTA lived at home with spouse.     Action/Plan:   Home when stable   Anticipated DC Date:  07/17/2014   Anticipated DC Plan:  [place]  CM consult      [facility] Choice  HOME HEALTH   Choice offered to / List presented to:  C-1 Patient        [place] arranged  HH-1 RN  [place].   Status of service:  Completed, signed off Medicare Important Message given?   (If response is "NO", the following Medicare IM given date fields will be blank) Date Medicare IM given:   Medicare IM given by:   Date Additional Medicare IM given:   Additional Medicare IM given by:    Discharge Disposition:  [place]  Per UR Regulation:  Reviewed for med. necessity/level of care/duration of stay  If discussed at [place] of Stay Meetings, dates discussed:    Comments:

## 2014-08-25 ENCOUNTER — Telehealth (INDEPENDENT_AMBULATORY_CARE_PROVIDER_SITE_OTHER): Payer: Self-pay | Admitting: General Surgery

## 2014-08-25 NOTE — Telephone Encounter (Signed)
Pt called stating panniculectomy wound was draining pink tinged fluid from his wound for the past few days.  Wife is changing the dressing several times a day.  Denies pain, fevers or erythema around the wound.  Pt has apt with Dr Hassell Done on Wed.  Discussed signs of wound infection.  SHe will call the office if this gets worse before Wed.

## 2014-11-22 ENCOUNTER — Other Ambulatory Visit (INDEPENDENT_AMBULATORY_CARE_PROVIDER_SITE_OTHER): Payer: Self-pay | Admitting: Surgery

## 2014-11-22 DIAGNOSIS — Z95828 Presence of other vascular implants and grafts: Secondary | ICD-10-CM

## 2014-12-04 ENCOUNTER — Other Ambulatory Visit: Payer: Self-pay | Admitting: Radiology

## 2014-12-05 ENCOUNTER — Ambulatory Visit (HOSPITAL_COMMUNITY)
Admission: RE | Admit: 2014-12-05 | Discharge: 2014-12-05 | Disposition: A | Discharge status: Home / Self Care | Payer: PPO | Source: Ambulatory Visit | Attending: Interventional Radiology | Admitting: Interventional Radiology

## 2014-12-05 ENCOUNTER — Encounter (HOSPITAL_COMMUNITY): Payer: Self-pay

## 2014-12-05 VITALS — BP 162/37 | HR 79 | Temp 98.3°F | Resp 18 | Wt >= 6400 oz

## 2014-12-05 DIAGNOSIS — Z86711 Personal history of pulmonary embolism: Secondary | ICD-10-CM | POA: Insufficient documentation

## 2014-12-05 DIAGNOSIS — K219 Gastro-esophageal reflux disease without esophagitis: Secondary | ICD-10-CM | POA: Insufficient documentation

## 2014-12-05 DIAGNOSIS — F329 Major depressive disorder, single episode, unspecified: Secondary | ICD-10-CM | POA: Insufficient documentation

## 2014-12-05 DIAGNOSIS — Z79899 Other long term (current) drug therapy: Secondary | ICD-10-CM | POA: Diagnosis not present

## 2014-12-05 DIAGNOSIS — Z86718 Personal history of other venous thrombosis and embolism: Secondary | ICD-10-CM | POA: Diagnosis not present

## 2014-12-05 DIAGNOSIS — E780 Pure hypercholesterolemia: Secondary | ICD-10-CM | POA: Diagnosis not present

## 2014-12-05 DIAGNOSIS — Z4589 Encounter for adjustment and management of other implanted devices: Secondary | ICD-10-CM | POA: Insufficient documentation

## 2014-12-05 DIAGNOSIS — I10 Essential (primary) hypertension: Secondary | ICD-10-CM | POA: Diagnosis not present

## 2014-12-05 DIAGNOSIS — Z7901 Long term (current) use of anticoagulants: Secondary | ICD-10-CM | POA: Insufficient documentation

## 2014-12-05 DIAGNOSIS — Z95828 Presence of other vascular implants and grafts: Secondary | ICD-10-CM | POA: Insufficient documentation

## 2014-12-05 DIAGNOSIS — Z794 Long term (current) use of insulin: Secondary | ICD-10-CM | POA: Insufficient documentation

## 2014-12-05 DIAGNOSIS — M3320 Polymyositis, organ involvement unspecified: Secondary | ICD-10-CM | POA: Diagnosis not present

## 2014-12-05 DIAGNOSIS — F419 Anxiety disorder, unspecified: Secondary | ICD-10-CM | POA: Diagnosis not present

## 2014-12-05 DIAGNOSIS — E1142 Type 2 diabetes mellitus with diabetic polyneuropathy: Secondary | ICD-10-CM | POA: Insufficient documentation

## 2014-12-05 DIAGNOSIS — G4733 Obstructive sleep apnea (adult) (pediatric): Secondary | ICD-10-CM | POA: Insufficient documentation

## 2014-12-05 LAB — CBC
HEMATOCRIT: 34 % — AB (ref 39.0–52.0)
Hemoglobin: 9.9 g/dL — ABNORMAL LOW (ref 13.0–17.0)
MCH: 21 pg — ABNORMAL LOW (ref 26.0–34.0)
MCHC: 29.1 g/dL — ABNORMAL LOW (ref 30.0–36.0)
MCV: 72 fL — ABNORMAL LOW (ref 78.0–100.0)
Platelets: 431 10*3/uL — ABNORMAL HIGH (ref 150–400)
RBC: 4.72 MIL/uL (ref 4.22–5.81)
RDW: 18.9 % — ABNORMAL HIGH (ref 11.5–15.5)
WBC: 8.5 10*3/uL (ref 4.0–10.5)

## 2014-12-05 LAB — GLUCOSE, CAPILLARY: GLUCOSE-CAPILLARY: 254 mg/dL — AB (ref 70–99)

## 2014-12-05 LAB — BASIC METABOLIC PANEL
Anion gap: 10 (ref 5–15)
BUN: 23 mg/dL (ref 6–23)
CO2: 25 mmol/L (ref 19–32)
Calcium: 8.9 mg/dL (ref 8.4–10.5)
Chloride: 101 mmol/L (ref 96–112)
Creatinine, Ser: 1.1 mg/dL (ref 0.50–1.35)
GFR calc Af Amer: 84 mL/min — ABNORMAL LOW (ref >90)
GFR, EST NON AFRICAN AMERICAN: 73 mL/min — AB (ref >90)
Glucose, Bld: 261 mg/dL — ABNORMAL HIGH (ref 70–99)
POTASSIUM: 4.3 mmol/L (ref 3.5–5.1)
Sodium: 136 mmol/L (ref 135–145)

## 2014-12-05 LAB — PROTIME-INR
INR: 2.05 — AB (ref 0.00–1.49)
PROTHROMBIN TIME: 23.3 s — AB (ref 11.6–15.2)

## 2014-12-05 MED ORDER — FENTANYL CITRATE 0.05 MG/ML IJ SOLN
INTRAMUSCULAR | Status: AC | PRN
  Administered 2014-12-05: 50 ug via INTRAVENOUS

## 2014-12-05 MED ORDER — LIDOCAINE HCL 1 % IJ SOLN
INTRAMUSCULAR | Status: AC
  Filled 2014-12-05: qty 20

## 2014-12-05 MED ORDER — IOHEXOL 300 MG/ML  SOLN
100.0000 mL | Freq: Once | INTRAMUSCULAR | Status: AC | PRN
  Administered 2014-12-05: 30 mL via INTRAVENOUS

## 2014-12-05 MED ORDER — SODIUM CHLORIDE 0.9 % IV SOLN
INTRAVENOUS | Status: DC
  Administered 2014-12-05: 11:00:00 via INTRAVENOUS

## 2014-12-05 MED ORDER — FENTANYL CITRATE 0.05 MG/ML IJ SOLN
INTRAMUSCULAR | Status: AC
  Filled 2014-12-05: qty 4

## 2014-12-05 MED ORDER — MIDAZOLAM HCL 2 MG/2ML IJ SOLN
INTRAMUSCULAR | Status: AC | PRN
  Administered 2014-12-05: 1 mg via INTRAVENOUS

## 2014-12-05 MED ORDER — MIDAZOLAM HCL 2 MG/2ML IJ SOLN
INTRAMUSCULAR | Status: AC
  Filled 2014-12-05: qty 4

## 2014-12-05 NOTE — Discharge Instructions (Signed)
Wound Care Wound care helps prevent pain and infection.   HOME CARE   Only take medicine as told by your doctor.  Clean the wound daily with mild soap and water.  Remove any bandages (dressings) in 24 hours or as told by your doctor.  Change the bandage if it gets wet, dirty, or starts to smell.  Take showers. Do not take baths, swim, or do anything that puts your wound under water.  Keep all doctor visits as told. GET HELP RIGHT AWAY IF:   Yellowish-white fluid (pus) comes from the wound.  Medicine does not lessen your pain.  There is a red streak going away from the wound.  You have a fever. MAKE SURE YOU:   Understand these instructions.  Will watch your condition.  Will get help right away if you are not doing well or get worse. Document Released: 06/22/2008 Document Revised: 12/06/2011 Document Reviewed: 01/17/2011 [facility] Patient Information 2015 [place], [place]. This information is not intended to replace advice given to you by your health care provider. Make sure you discuss any questions you have with your health care provider.

## 2014-12-05 NOTE — H&P (Signed)
Chief Complaint: "I am here to get my filter removed."  Referring Physician(s): Martin,Matthew  History of Present Illness: Joseph [name]. is a 59 y.o. male with history of DVT/PE on chronic coumadin. IR placed a retrievable IVC filter on 07/03/14 pre-operatively for a panniculectomy while patient was off anticoagulation. The patient has now resumed chronic anticoagulation and is within the therapeutic range, last INR this week > 2.0, he is without symptoms of new DVT/PE. He is scheduled today for IVC filter removal. He denies any chest pain, shortness of breath or palpitations. He denies any worsening LE edema or pain. He denies any active signs of bleeding or excessive bruising. He denies any recent fever or chills. The patient does have OSA and uses a CPAP. He has previously tolerated sedation without complications.   Past Medical History  Diagnosis Date  . Morbid obesity   . Pure hypercholesterolemia   . Essential hypertension, benign   . 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 Esophageal reflux   . Type II or unspecified type diabetes mellitus without mention of complication, not stated as uncontrolled   . Anxiety state, unspecified   . Essential hypertension, benign   . Varicose veins of lower extremities with inflammation   . Peripheral vascular disease, unspecified   . Edema   . Type II or unspecified type diabetes mellitus with neurological manifestations, not stated as uncontrolled   . Polyneuropathy in diabetes(357.2)   . Personal history of PE (pulmonary embolism) 2004  . Depression   . Unspecified sleep apnea     uses CPAP  . Obstructive sleep apnea (adult) (pediatric)   . DVT (deep venous thrombosis) 2004  . Shortness of breath     DUE TO WEIGHT AND LOSS OF MOBILITY  . Myalgia and myositis, unspecified     BACK AND LEGS  . Falls frequently   . Poor venous access     PT STATES DIFFICULT TO DRAW BLOOD  FROM HIS VEINS    Past Surgical History  Procedure Laterality Date  . No past surgeries    . No previous surgery    . Panniculectomy N/A 07/15/2014    Procedure: PANNICULECTOMY;  Surgeon: Pedro Earls, MD;  Location: [facility];  Service: General;  Laterality: N/A;    Allergies: Review of patient's allergies indicates no known allergies.  Medications: Prior to Admission medications   Medication Sig Start Date End Date Taking? Authorizing Provider  amLODipine (NORVASC) 5 MG tablet Take 5 mg by mouth daily.    Yes Historical Provider, MD  ferrous sulfate 325 (65 FE) MG tablet Take 325 mg by mouth daily with breakfast.   Yes Historical Provider, MD  FLUoxetine (PROZAC) 40 MG capsule Take 40 mg by mouth at bedtime.    Yes Historical Provider, MD  furosemide (LASIX) 20 MG tablet Take 20 mg by mouth daily.    Yes Historical Provider, MD  indapamide (LOZOL) 2.5 MG tablet Take 5 mg by mouth daily.    Yes Historical Provider, MD  insulin aspart (NOVOLOG) 100 UNIT/ML injection Inject 0-30 Units into the skin 3 (three) times daily as needed for high blood sugar (CBG >150). Per sliding scale   Yes Historical Provider, MD  insulin glargine (LANTUS) 100 UNIT/ML injection Inject 60 Units into the skin at bedtime.    Yes Historical Provider, MD  metFORMIN (GLUCOPHAGE) 1000 MG tablet Take 1,000 mg by mouth 2 (two) times daily with  a meal.   Yes Historical Provider, MD  omeprazole (PRILOSEC) 40 MG capsule Take 40 mg by mouth daily.   Yes Historical Provider, MD  pioglitazone (ACTOS) 45 MG tablet Take 45 mg by mouth daily.   Yes Historical Provider, MD  pravastatin (PRAVACHOL) 40 MG tablet Take 40 mg by mouth at bedtime.    Yes Historical Provider, MD  Prenatal Vit-Fe Fumarate-FA (PRENATAL FA PO) Take 1 tablet by mouth daily.   Yes Historical Provider, MD  quinapril (ACCUPRIL) 20 MG tablet Take 20 mg by mouth daily.    Yes Historical Provider, MD  silver sulfADIAZINE (SILVADENE) 1 % cream Apply 1  application topically 2 (two) times daily as needed (wound care).   Yes Historical Provider, MD  warfarin (COUMADIN) 5 MG tablet Take 5-7.5 mg by mouth at bedtime. Take 1 1/2 tablet  (7.5 mg) on Mondays, take 1 tablet (5 mg) Tuesday thru Sunday   Yes Historical Provider, MD  Wheat Dextrin (BENEFIBER) POWD Take 15 mLs by mouth daily. Mix in coffee and drink   Yes Historical Provider, MD  enoxaparin (LOVENOX) 40 MG/0.4ML injection Inject 0.4 mLs (40 mg total) into the skin daily. Patient not taking: Reported on 12/04/2014 07/17/14   Johnathan Hausen, MD  HYDROcodone-acetaminophen (NORCO/VICODIN) 5-325 MG per tablet Take 1-2 tablets by mouth every 4 (four) hours as needed for moderate pain. Patient not taking: Reported on 12/04/2014 07/17/14   Johnathan Hausen, MD     History reviewed. No pertinent family history.  History   Social History  . Marital Status: Married    Spouse Name: N/A  . Number of Children: N/A  . Years of Education: N/A   Social History Main Topics  . Smoking status: Never Smoker   . Smokeless tobacco: Never Used  . Alcohol Use: No  . Drug Use: No  . Sexual Activity: Not on file   Other Topics Concern  . None   Social History Narrative   Review of Systems: A 12 point ROS discussed and pertinent positives are indicated in the HPI above.  All other systems are negative.  Review of Systems  Vital Signs: Pulse 83  Temp(Src) 98.1 F (36.7 C)  Resp 18  Wt 486 lb (220.448 kg)  SpO2 98%  Physical Exam  Constitutional: He is oriented to person, place, and time. No distress.  HENT:  Head: Normocephalic and atraumatic.  Neck: No tracheal deviation present.  Cardiovascular: Normal rate and regular rhythm.  Exam reveals no gallop and no friction rub.   No murmur heard. Pulmonary/Chest: Effort normal and breath sounds normal. No respiratory distress. He has no wheezes. He has no rales.  Abdominal: Soft. Bowel sounds are normal. He exhibits no distension. There is no  tenderness.  Neurological: He is alert and oriented to person, place, and time.  Skin: He is not diaphoretic.  Psychiatric: He has a normal mood and affect. His behavior is normal. Thought content normal.    Mallampati Score:  MD Evaluation Airway: WNL Heart: WNL Abdomen: WNL Chest/ Lungs: WNL ASA  Classification: 2 Mallampati/Airway Score: Two  Imaging: No results found.  Labs:  CBC:  Recent Labs  07/08/14 1150 07/15/14 1310 07/16/14 0550 07/17/14 0443  WBC 8.7 17.4* 9.3 9.1  HGB 13.2 12.2* 10.4* 10.2*  HCT 41.7 39.5 31.9* 31.0*  PLT 357 318 279 265    COAGS:  Recent Labs  07/03/14 1250 07/08/14 1150 07/15/14 0648  INR 1.78* 2.41* 1.17  APTT  --  46*  --  BMP:  Recent Labs  07/03/14 1250 07/08/14 1150 07/15/14 1310 07/16/14 0550 07/17/14 0443  NA 141 140  --  142 138  K 4.3 4.0  --  4.2 4.0  CL 101 99  --  104 101  CO2 28 30  --  24 25  GLUCOSE 185* 87  --  168* 156*  BUN 19 21  --  22 19  CALCIUM 8.8 9.2  --  7.9* 8.1*  CREATININE 0.99 1.09 1.50* 1.20 1.00  GFRNONAA 89* 74* 50* 65* 82*  GFRAA >90 85* 58* 76* >90    Assessment and Plan: History of DVT/PE was on chronic anticoagulation  Had to be off anticoagulation for panniculectomy  IVC filter placed pre-op 07/03/14 Post op now back on therapeutic coumadin Scheduled today for IVC filter removal with moderate sedation The patient has been NPO, on coumadin, no recent DVT/PE, labs and vitals have been reviewed. Risks and Benefits discussed with the patient including, but not limited to bleeding, infection, contrast induced renal failure, vascular injury or possibly unable to safely remove. All of the patient's questions were answered, patient is agreeable to proceed. Consent signed and in chart.    Thank you for this interesting consult.  I greatly enjoyed meeting Joseph [name]. and look forward to participating in their care.  SignedHedy Jacob 12/05/2014, 10:41 AM   I  spent a total of 20 Minutes in face to face in clinical consultation, greater than 50% of which was counseling/coordinating care for History of DVT/PE off coumadin for surgery with IVC filter placed, here for removal today, no longer needed.

## 2014-12-05 NOTE — Sedation Documentation (Addendum)
Report given to Richelle Ito, RN

## 2014-12-05 NOTE — Procedures (Signed)
Interventional Radiology Procedure Note  Procedure: IVC filter retrieval.  Jugular access.  Complications: No immediate Recommendations:  - Ok to shower tomorrow - Do not submerge for 7 days - Routine care  Signed,  Dulcy Fanny. Earleen Newport, DO

## 2014-12-05 NOTE — Sedation Documentation (Signed)
dsg to R neck intact; IVC filter retrieved  successfully

## 2014-12-30 ENCOUNTER — Emergency Department (HOSPITAL_COMMUNITY)
Admission: EM | Admit: 2014-12-30 | Discharge: 2014-12-31 | Disposition: A | Discharge status: Home / Self Care | Payer: PPO | Source: Home / Self Care | Attending: Emergency Medicine | Admitting: Emergency Medicine

## 2014-12-30 ENCOUNTER — Encounter (HOSPITAL_COMMUNITY): Payer: Self-pay

## 2014-12-30 ENCOUNTER — Emergency Department (HOSPITAL_COMMUNITY): Payer: PPO

## 2014-12-30 DIAGNOSIS — R1084 Generalized abdominal pain: Secondary | ICD-10-CM

## 2014-12-30 DIAGNOSIS — R509 Fever, unspecified: Secondary | ICD-10-CM | POA: Diagnosis not present

## 2014-12-30 DIAGNOSIS — T814XXA Infection following a procedure, initial encounter: Secondary | ICD-10-CM | POA: Diagnosis not present

## 2014-12-30 DIAGNOSIS — IMO0001 Reserved for inherently not codable concepts without codable children: Secondary | ICD-10-CM

## 2014-12-30 LAB — BASIC METABOLIC PANEL
Anion gap: 10 (ref 5–15)
BUN: 31 mg/dL — AB (ref 6–23)
CO2: 26 mmol/L (ref 19–32)
Calcium: 8.1 mg/dL — ABNORMAL LOW (ref 8.4–10.5)
Chloride: 100 mmol/L (ref 96–112)
Creatinine, Ser: 1.38 mg/dL — ABNORMAL HIGH (ref 0.50–1.35)
GFR, EST AFRICAN AMERICAN: 64 mL/min — AB (ref >90)
GFR, EST NON AFRICAN AMERICAN: 55 mL/min — AB (ref >90)
GLUCOSE: 178 mg/dL — AB (ref 70–99)
Potassium: 3.5 mmol/L (ref 3.5–5.1)
Sodium: 136 mmol/L (ref 135–145)

## 2014-12-30 LAB — URINALYSIS, ROUTINE W REFLEX MICROSCOPIC
Bilirubin Urine: NEGATIVE
GLUCOSE, UA: NEGATIVE mg/dL
Hgb urine dipstick: NEGATIVE
KETONES UR: NEGATIVE mg/dL
LEUKOCYTES UA: NEGATIVE
NITRITE: NEGATIVE
PROTEIN: NEGATIVE mg/dL
Specific Gravity, Urine: 1.019 (ref 1.005–1.030)
UROBILINOGEN UA: 0.2 mg/dL (ref 0.0–1.0)
pH: 5.5 (ref 5.0–8.0)

## 2014-12-30 LAB — CBC WITH DIFFERENTIAL/PLATELET
BASOS ABS: 0 10*3/uL (ref 0.0–0.1)
BASOS PCT: 0 % (ref 0–1)
EOS ABS: 0.1 10*3/uL (ref 0.0–0.7)
EOS PCT: 1 % (ref 0–5)
HCT: 32 % — ABNORMAL LOW (ref 39.0–52.0)
Hemoglobin: 9.5 g/dL — ABNORMAL LOW (ref 13.0–17.0)
Lymphocytes Relative: 16 % (ref 12–46)
Lymphs Abs: 2.2 10*3/uL (ref 0.7–4.0)
MCH: 21.9 pg — ABNORMAL LOW (ref 26.0–34.0)
MCHC: 29.7 g/dL — ABNORMAL LOW (ref 30.0–36.0)
MCV: 73.7 fL — AB (ref 78.0–100.0)
Monocytes Absolute: 0.7 10*3/uL (ref 0.1–1.0)
Monocytes Relative: 5 % (ref 3–12)
NEUTROS PCT: 79 % — AB (ref 43–77)
Neutro Abs: 11 10*3/uL — ABNORMAL HIGH (ref 1.7–7.7)
PLATELETS: 386 10*3/uL (ref 150–400)
RBC: 4.34 MIL/uL (ref 4.22–5.81)
RDW: 18.6 % — AB (ref 11.5–15.5)
WBC: 14 10*3/uL — ABNORMAL HIGH (ref 4.0–10.5)

## 2014-12-30 LAB — PROTIME-INR
INR: 1.71 — ABNORMAL HIGH (ref 0.00–1.49)
Prothrombin Time: 20.2 seconds — ABNORMAL HIGH (ref 11.6–15.2)

## 2014-12-30 LAB — I-STAT CG4 LACTIC ACID, ED: Lactic Acid, Venous: 1.73 mmol/L (ref 0.5–2.0)

## 2014-12-30 MED ORDER — IOHEXOL 300 MG/ML  SOLN: 50.0000 mL | Freq: Once | INTRAMUSCULAR | Status: AC | PRN

## 2014-12-30 MED ORDER — VANCOMYCIN HCL IN DEXTROSE 1-5 GM/200ML-% IV SOLN
1000.0000 mg | Freq: Once | INTRAVENOUS | Status: AC
  Administered 2014-12-30: 1000 mg via INTRAVENOUS
  Filled 2014-12-30: qty 200

## 2014-12-30 NOTE — ED Notes (Signed)
Pt has been having intermittent fevers since Thursday, prior to coming to the ED it 100.8, took tylenol prior to coming, pt looks flush in triage. He has a wound vac in place from a procedure in October

## 2014-12-30 NOTE — ED Provider Notes (Signed)
CSN: [id number]     Arrival date & time 12/30/14  1913 History   First MD Initiated Contact with Patient 12/30/14 2016     Chief Complaint  Patient presents with  . Fever     (Consider location/radiation/quality/duration/timing/severity/associated sxs/prior Treatment) HPI Comments: Patient presents with fever for the past 4 days. MAXIMUM TEMPERATURE at home 100.8. He endorses fatigue and generalized weakness. Patient with history of chronic abdominal wound. He had a panniculectomy in October and has had a wound VAC in place since January. His home care nurse as the wound is improving. The discharge and drainage have reduced. He denies any vomiting, cough, congestion, sore throat, pain with urination or blood in the urine. He denies any chest pain or shortness of breath. He is on Coumadin for history of PE or DVT. He denies any dizziness or lightheadedness. He feels generally weak and wiped out.  The history is provided by the patient and a relative.    Past Medical History  Diagnosis Date  . Morbid obesity   . Pure hypercholesterolemia   . Essential hypertension, benign   . 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 Esophageal reflux   . Type II or unspecified type diabetes mellitus without mention of complication, not stated as uncontrolled   . Anxiety state, unspecified   . Essential hypertension, benign   . Varicose veins of lower extremities with inflammation   . Peripheral vascular disease, unspecified   . Edema   . Type II or unspecified type diabetes mellitus with neurological manifestations, not stated as uncontrolled   . Polyneuropathy in diabetes(357.2)   . Personal history of PE (pulmonary embolism) 2004  . Depression   . Unspecified sleep apnea     uses CPAP  . Obstructive sleep apnea (adult) (pediatric)   . DVT (deep venous thrombosis) 2004  . Shortness of breath     DUE TO WEIGHT AND LOSS OF MOBILITY  . Myalgia  and myositis, unspecified     BACK AND LEGS  . Falls frequently   . Poor venous access     PT STATES DIFFICULT TO DRAW BLOOD FROM HIS VEINS   Past Surgical History  Procedure Laterality Date  . No past surgeries    . No previous surgery    . Panniculectomy N/A 07/15/2014    Procedure: PANNICULECTOMY;  Surgeon: Pedro Earls, MD;  Location: [facility];  Service: General;  Laterality: N/A;   History reviewed. No pertinent family history. History  Substance Use Topics  . Smoking status: Never Smoker   . Smokeless tobacco: Never Used  . Alcohol Use: No    Review of Systems  Constitutional: Positive for fever, activity change, appetite change and fatigue.  HENT: Negative for congestion and rhinorrhea.   Respiratory: Negative for cough, shortness of breath and wheezing.   Cardiovascular: Negative for chest pain.  Gastrointestinal: Positive for abdominal pain. Negative for nausea and vomiting.  Genitourinary: Negative for dysuria, hematuria and decreased urine volume.  Musculoskeletal: Negative for myalgias and arthralgias.  Skin: Negative for wound.  Neurological: Positive for weakness. Negative for dizziness, light-headedness and headaches.  A complete 10 system review of systems was obtained and all systems are negative except as noted in the HPI and PMH.      Allergies  Review of patient's allergies indicates no known allergies.  Home Medications   Prior to Admission medications   Medication Sig Start Date End Date Taking? Authorizing Provider  amLODipine (NORVASC) 5 MG tablet Take 5 mg by mouth daily.    Yes Historical Provider, MD  ferrous sulfate 325 (65 FE) MG tablet Take 325 mg by mouth daily with breakfast.   Yes Historical Provider, MD  FLUoxetine (PROZAC) 40 MG capsule Take 40 mg by mouth at bedtime.    Yes Historical Provider, MD  furosemide (LASIX) 20 MG tablet Take 20 mg by mouth daily.    Yes Historical Provider, MD  indapamide (LOZOL) 2.5 MG tablet Take 5 mg by  mouth daily.    Yes Historical Provider, MD  insulin aspart (NOVOLOG) 100 UNIT/ML injection Inject 0-30 Units into the skin 3 (three) times daily as needed for high blood sugar (CBG >150). Per sliding scale   Yes Historical Provider, MD  insulin glargine (LANTUS) 100 UNIT/ML injection Inject 60 Units into the skin at bedtime.    Yes Historical Provider, MD  metFORMIN (GLUCOPHAGE) 1000 MG tablet Take 1,000 mg by mouth 2 (two) times daily with a meal.   Yes Historical Provider, MD  omeprazole (PRILOSEC) 40 MG capsule Take 40 mg by mouth daily.   Yes Historical Provider, MD  pioglitazone (ACTOS) 45 MG tablet Take 45 mg by mouth daily.   Yes Historical Provider, MD  pravastatin (PRAVACHOL) 40 MG tablet Take 40 mg by mouth at bedtime.    Yes Historical Provider, MD  Prenatal Vit-Fe Fumarate-FA (PRENATAL FA PO) Take 1 tablet by mouth daily.   Yes Historical Provider, MD  quinapril (ACCUPRIL) 20 MG tablet Take 20 mg by mouth daily.    Yes Historical Provider, MD  silver sulfADIAZINE (SILVADENE) 1 % cream Apply 1 application topically 2 (two) times daily as needed (wound care).   Yes Historical Provider, MD  warfarin (COUMADIN) 5 MG tablet Take 5-7.5 mg by mouth at bedtime. Take 5mg  on Tuesday, Wednesday, Thursday, Friday, Saturday, and Sunday Take 7.5mg  on Mondays   Yes Historical Provider, MD  Wheat Dextrin (BENEFIBER) POWD Take 15 mLs by mouth daily. Mix in coffee and drink   Yes Historical Provider, MD  amoxicillin-clavulanate (AUGMENTIN) 875-125 MG per tablet Take 1 tablet by mouth every 12 (twelve) hours. 12/31/14   Ezequiel Essex, MD  enoxaparin (LOVENOX) 40 MG/0.4ML injection Inject 0.4 mLs (40 mg total) into the skin daily. Patient not taking: Reported on 12/04/2014 07/17/14   Johnathan Hausen, MD   BP 139/39 mmHg  Pulse 90  Temp(Src) 101.3 F (38.5 C) (Rectal)  Resp 18  Ht 6\' 1"  (1.854 m)  Wt 430 lb (195.047 kg)  BMI 56.74 kg/m2  SpO2 93% Physical Exam  Constitutional: He is oriented to  person, place, and time. He appears well-developed and well-nourished. No distress.  Morbidly obese  HENT:  Head: Normocephalic and atraumatic.  Right Ear: External ear normal.  Left Ear: External ear normal.  Mouth/Throat: Oropharynx is clear and moist. No oropharyngeal exudate.  Eyes: Conjunctivae and EOM are normal. Pupils are equal, round, and reactive to light.  Neck: Normal range of motion. Neck supple.  Cardiovascular: Normal rate, regular rhythm and normal heart sounds.   No murmur heard. Pulmonary/Chest: Effort normal and breath sounds normal. No respiratory distress.  Abdominal: Soft. There is tenderness.  Wound VAC in the midline of obese abdomen. There is surrounding induration and hyper pigmentation of the skin without overt erythema. Wound to right lateral abdominal wall appears clean with scant white drainage.  No surrounding cellulitis.  Musculoskeletal: He exhibits no edema or tenderness.  Neurological: He is alert and oriented to  person, place, and time. No cranial nerve deficit. Coordination normal.  Skin: Skin is warm.    ED Course  Procedures (including critical care time) Labs Review Labs Reviewed  CBC WITH DIFFERENTIAL/PLATELET - Abnormal; Notable for the following:    WBC 14.0 (*)    Hemoglobin 9.5 (*)    HCT 32.0 (*)    MCV 73.7 (*)    MCH 21.9 (*)    MCHC 29.7 (*)    RDW 18.6 (*)    Neutrophils Relative % 79 (*)    Neutro Abs 11.0 (*)    All other components within normal limits  BASIC METABOLIC PANEL - Abnormal; Notable for the following:    Glucose, Bld 178 (*)    BUN 31 (*)    Creatinine, Ser 1.38 (*)    Calcium 8.1 (*)    GFR calc non Af Amer 55 (*)    GFR calc Af Amer 64 (*)    All other components within normal limits  PROTIME-INR - Abnormal; Notable for the following:    Prothrombin Time 20.2 (*)    INR 1.71 (*)    All other components within normal limits  CULTURE, BLOOD (ROUTINE X 2)  CULTURE, BLOOD (ROUTINE X 2)  URINALYSIS, ROUTINE  W REFLEX MICROSCOPIC  I-STAT CG4 LACTIC ACID, ED  I-STAT CG4 LACTIC ACID, ED  POC OCCULT BLOOD, ED    Imaging Review Ct Abdomen Pelvis Wo Contrast  12/30/2014   CLINICAL DATA:  Intermittent fevers since Thursday. Wound VAC in place from a procedure in October. Generalized abdominal pain.  EXAM: CT ABDOMEN AND PELVIS WITHOUT CONTRAST  TECHNIQUE: Multidetector CT imaging of the abdomen and pelvis was performed following the standard protocol without IV contrast.  COMPARISON:  None.  FINDINGS: Mild atelectasis in the lung bases.  Prominent extrapleural fat.  Evaluation of solid organs and vascular structures is limited without IV contrast material. The unenhanced appearance of the liver, spleen, gallbladder, pancreas, adrenal glands, kidneys, inferior vena cava, and retroperitoneal lymph nodes is unremarkable. Mild calcification of the aorta without aneurysm. Stomach, small bowel, and colon are not abnormally distended. Mild central mesenteric stranding with mild prominence of lymph nodes. This is likely reactive or inflammatory. No free air or free fluid in the abdomen.  Pelvis: Bladder is decompressed without significant wall thickening. Prostate gland is not enlarged. No free or loculated pelvic fluid collections. No pelvic mass or lymphadenopathy. Appendix is normal. No evidence of diverticulitis. Prominent subcutaneous adipose tissues, incompletely included within the field of view. In the right lower anterior abdominal subcutaneous fat, there is infiltration with skin defect extending deep into the subcutaneous fat of the panniculus. There is a gas and fluid collection within the subcutaneous fat measuring about 4.2 x 7.3 cm. This suggests an abscess with fistula. Linear infiltration inferiorly extends across the midline to the left subcutaneous tissues. This may be along the surgical incision site. Diffuse infiltration of the inferior fatty tissues with skin thickening. Surgical drain in the left side.  Mild degenerative changes in the spine. No destructive bone lesions.  IMPRESSION: Gas and fluid collection within the subcutaneous fat of the right lower anterior abdominal wall associated with infiltration and skin defect consistent with cellulitis, abscess and fistula, likely associated with postoperative site. Infiltration at in the mesentery with mild prominence of mesenteric lymph nodes, likely reactive or inflammatory. No bowel obstruction.   Electronically Signed   By: Lucienne Capers M.D.   On: 12/30/2014 22:37   Dg Chest 2 View  12/30/2014   CLINICAL DATA:  Intermittent fevers since Thursday.  EXAM: CHEST  2 VIEW  COMPARISON:  07/08/2014  FINDINGS: Scarring in the left lung base unchanged since prior study. Normal heart size and pulmonary vascularity. No focal airspace disease or consolidation in the lungs. No blunting of costophrenic angles. No pneumothorax. Mediastinal contours appear intact.  IMPRESSION: No active cardiopulmonary disease.   Electronically Signed   By: Lucienne Capers M.D.   On: 12/30/2014 21:29     EKG Interpretation None      MDM   Final diagnoses:  Wound infection after surgery, initial encounter   fever with fatigue. Wound VAC in place without evidence of obvious cellulitis. Morbid obesity limits exam.   Urinalysis negative. Chest x-ray negative. Patient leukocytosis of 14. Lactate normal. Blood cultures sent. Abdominal imaging will be obtained given his history of surgery with wound VAC in place. Rectal temperature 101.3. Leukocytosis of 14. Vitals stable.  CT scan results reviewed with Dr. Harlow Asa. He feels that this is a chronic wound infection they can be managed with outpatient antibiotics. Patient is not having any vomiting or diarrhea. He is nontoxic and nonseptic. He will refer patient's chart to Dr. Hassell Done so he can see him in the office tomorrow. Patient wishes to go home and he is comfortable with this plan. He is informed of his low INR.  Will  discharge with augmentin and plan for follow up tomorrow with Dr. Harlow Asa.  BP 139/39 mmHg  Pulse 90  Temp(Src) 101.3 F (38.5 C) (Rectal)  Resp 18  Ht 6\' 1"  (1.854 m)  Wt 430 lb (195.047 kg)  BMI 56.74 kg/m2  SpO2 93%      Ezequiel Essex, MD 12/31/14 [id number]

## 2014-12-31 ENCOUNTER — Inpatient Hospital Stay (HOSPITAL_COMMUNITY)
Admission: AD | Admit: 2014-12-31 | Discharge: 2015-01-08 | DRG: 863 | Disposition: A | Discharge status: Home / Self Care | Payer: PPO | Source: Ambulatory Visit | Attending: Surgery | Admitting: Surgery

## 2014-12-31 ENCOUNTER — Encounter (HOSPITAL_COMMUNITY): Payer: Self-pay

## 2014-12-31 DIAGNOSIS — N289 Disorder of kidney and ureter, unspecified: Secondary | ICD-10-CM

## 2014-12-31 DIAGNOSIS — Z7901 Long term (current) use of anticoagulants: Secondary | ICD-10-CM | POA: Diagnosis not present

## 2014-12-31 DIAGNOSIS — K219 Gastro-esophageal reflux disease without esophagitis: Secondary | ICD-10-CM | POA: Diagnosis present

## 2014-12-31 DIAGNOSIS — L02219 Cutaneous abscess of trunk, unspecified: Secondary | ICD-10-CM | POA: Insufficient documentation

## 2014-12-31 DIAGNOSIS — L02211 Cutaneous abscess of abdominal wall: Secondary | ICD-10-CM | POA: Diagnosis present

## 2014-12-31 DIAGNOSIS — L03311 Cellulitis of abdominal wall: Secondary | ICD-10-CM | POA: Diagnosis present

## 2014-12-31 DIAGNOSIS — R0602 Shortness of breath: Secondary | ICD-10-CM

## 2014-12-31 DIAGNOSIS — E1142 Type 2 diabetes mellitus with diabetic polyneuropathy: Secondary | ICD-10-CM | POA: Diagnosis present

## 2014-12-31 DIAGNOSIS — E8770 Fluid overload, unspecified: Secondary | ICD-10-CM | POA: Diagnosis not present

## 2014-12-31 DIAGNOSIS — I10 Essential (primary) hypertension: Secondary | ICD-10-CM | POA: Diagnosis present

## 2014-12-31 DIAGNOSIS — Z6841 Body Mass Index (BMI) 40.0 and over, adult: Secondary | ICD-10-CM

## 2014-12-31 DIAGNOSIS — L03319 Cellulitis of trunk, unspecified: Secondary | ICD-10-CM

## 2014-12-31 DIAGNOSIS — Z95828 Presence of other vascular implants and grafts: Secondary | ICD-10-CM

## 2014-12-31 DIAGNOSIS — E780 Pure hypercholesterolemia: Secondary | ICD-10-CM | POA: Diagnosis present

## 2014-12-31 DIAGNOSIS — M793 Panniculitis, unspecified: Secondary | ICD-10-CM | POA: Diagnosis present

## 2014-12-31 DIAGNOSIS — R509 Fever, unspecified: Secondary | ICD-10-CM | POA: Diagnosis present

## 2014-12-31 DIAGNOSIS — T814XXA Infection following a procedure, initial encounter: Principal | ICD-10-CM | POA: Diagnosis present

## 2014-12-31 DIAGNOSIS — R195 Other fecal abnormalities: Secondary | ICD-10-CM | POA: Diagnosis not present

## 2014-12-31 DIAGNOSIS — Y838 Other surgical procedures as the cause of abnormal reaction of the patient, or of later complication, without mention of misadventure at the time of the procedure: Secondary | ICD-10-CM | POA: Diagnosis present

## 2014-12-31 DIAGNOSIS — G4733 Obstructive sleep apnea (adult) (pediatric): Secondary | ICD-10-CM | POA: Diagnosis present

## 2014-12-31 DIAGNOSIS — I739 Peripheral vascular disease, unspecified: Secondary | ICD-10-CM | POA: Diagnosis present

## 2014-12-31 DIAGNOSIS — E119 Type 2 diabetes mellitus without complications: Secondary | ICD-10-CM

## 2014-12-31 DIAGNOSIS — Z86718 Personal history of other venous thrombosis and embolism: Secondary | ICD-10-CM

## 2014-12-31 DIAGNOSIS — B9689 Other specified bacterial agents as the cause of diseases classified elsewhere: Secondary | ICD-10-CM | POA: Diagnosis not present

## 2014-12-31 DIAGNOSIS — Z86711 Personal history of pulmonary embolism: Secondary | ICD-10-CM | POA: Diagnosis present

## 2014-12-31 DIAGNOSIS — T814XXD Infection following a procedure, subsequent encounter: Secondary | ICD-10-CM | POA: Diagnosis not present

## 2014-12-31 DIAGNOSIS — Z9889 Other specified postprocedural states: Secondary | ICD-10-CM

## 2014-12-31 DIAGNOSIS — F411 Generalized anxiety disorder: Secondary | ICD-10-CM | POA: Diagnosis present

## 2014-12-31 DIAGNOSIS — T8149XA Infection following a procedure, other surgical site, initial encounter: Secondary | ICD-10-CM | POA: Diagnosis present

## 2014-12-31 LAB — POC OCCULT BLOOD, ED: Fecal Occult Bld: NEGATIVE

## 2014-12-31 LAB — GLUCOSE, CAPILLARY
GLUCOSE-CAPILLARY: 173 mg/dL — AB (ref 70–99)
GLUCOSE-CAPILLARY: 179 mg/dL — AB (ref 70–99)
Glucose-Capillary: 322 mg/dL — ABNORMAL HIGH (ref 70–99)

## 2014-12-31 MED ORDER — AMLODIPINE BESYLATE 5 MG PO TABS
5.0000 mg | ORAL_TABLET | Freq: Every day | ORAL | Status: DC
  Administered 2015-01-01 – 2015-01-08 (×8): 5 mg via ORAL
  Filled 2014-12-31 (×8): qty 1

## 2014-12-31 MED ORDER — FLUOXETINE HCL 20 MG PO CAPS
40.0000 mg | ORAL_CAPSULE | Freq: Every day | ORAL | Status: DC
  Administered 2014-12-31 – 2015-01-07 (×8): 40 mg via ORAL
  Filled 2014-12-31 (×9): qty 2

## 2014-12-31 MED ORDER — HYDROCODONE-ACETAMINOPHEN 5-325 MG PO TABS: 1.0000 | ORAL_TABLET | ORAL | Status: DC | PRN

## 2014-12-31 MED ORDER — INDAPAMIDE 2.5 MG PO TABS
5.0000 mg | ORAL_TABLET | Freq: Every day | ORAL | Status: DC
  Administered 2015-01-01 – 2015-01-08 (×8): 5 mg via ORAL
  Filled 2014-12-31 (×8): qty 2

## 2014-12-31 MED ORDER — PIPERACILLIN-TAZOBACTAM 3.375 G IVPB
3.3750 g | Freq: Three times a day (TID) | INTRAVENOUS | Status: DC
  Administered 2014-12-31 – 2015-01-06 (×18): 3.375 g via INTRAVENOUS
  Filled 2014-12-31 (×19): qty 50

## 2014-12-31 MED ORDER — PANTOPRAZOLE SODIUM 40 MG IV SOLR
40.0000 mg | Freq: Every day | INTRAVENOUS | Status: DC
Start: 2014-12-31 — End: 2015-01-08
  Administered 2014-12-31 – 2015-01-07 (×8): 40 mg via INTRAVENOUS
  Filled 2014-12-31 (×9): qty 40

## 2014-12-31 MED ORDER — DOCUSATE SODIUM 100 MG PO CAPS
100.0000 mg | ORAL_CAPSULE | Freq: Two times a day (BID) | ORAL | Status: DC
  Administered 2014-12-31 – 2015-01-02 (×5): 100 mg via ORAL
  Filled 2014-12-31 (×17): qty 1

## 2014-12-31 MED ORDER — MORPHINE SULFATE 2 MG/ML IJ SOLN: 1.0000 mg | INTRAMUSCULAR | Status: DC | PRN

## 2014-12-31 MED ORDER — WARFARIN - PHYSICIAN DOSING INPATIENT: Freq: Every day | Status: DC

## 2014-12-31 MED ORDER — AMOXICILLIN-POT CLAVULANATE 875-125 MG PO TABS
1.0000 | ORAL_TABLET | Freq: Two times a day (BID) | ORAL | Status: DC
Start: 2014-12-31 — End: 2015-01-08

## 2014-12-31 MED ORDER — ENOXAPARIN SODIUM 40 MG/0.4ML ~~LOC~~ SOLN
40.0000 mg | SUBCUTANEOUS | Status: DC
  Administered 2014-12-31: 40 mg via SUBCUTANEOUS
  Filled 2014-12-31 (×2): qty 0.4

## 2014-12-31 MED ORDER — INSULIN ASPART 100 UNIT/ML ~~LOC~~ SOLN
0.0000 [IU] | SUBCUTANEOUS | Status: DC
  Administered 2014-12-31: 4 [IU] via SUBCUTANEOUS
  Administered 2014-12-31: 15 [IU] via SUBCUTANEOUS
  Administered 2015-01-01: 4 [IU] via SUBCUTANEOUS
  Administered 2015-01-01: 20 [IU] via SUBCUTANEOUS
  Administered 2015-01-01 (×2): 7 [IU] via SUBCUTANEOUS
  Administered 2015-01-01: 20 [IU] via SUBCUTANEOUS
  Administered 2015-01-01: 11 [IU] via SUBCUTANEOUS
  Administered 2015-01-02 (×2): 7 [IU] via SUBCUTANEOUS
  Administered 2015-01-02: 4 [IU] via SUBCUTANEOUS
  Administered 2015-01-02 (×2): 11 [IU] via SUBCUTANEOUS
  Administered 2015-01-02: 7 [IU] via SUBCUTANEOUS
  Administered 2015-01-03 (×7): 4 [IU] via SUBCUTANEOUS
  Administered 2015-01-04 (×2): 3 [IU] via SUBCUTANEOUS
  Administered 2015-01-04: 4 [IU] via SUBCUTANEOUS
  Administered 2015-01-04: 3 [IU] via SUBCUTANEOUS
  Administered 2015-01-05: 4 [IU] via SUBCUTANEOUS
  Administered 2015-01-05: 3 [IU] via SUBCUTANEOUS

## 2014-12-31 MED ORDER — INSULIN GLARGINE 100 UNIT/ML ~~LOC~~ SOLN
10.0000 [IU] | Freq: Every day | SUBCUTANEOUS | Status: DC
  Administered 2014-12-31: 10 [IU] via SUBCUTANEOUS
  Filled 2014-12-31: qty 0.1

## 2014-12-31 MED ORDER — FUROSEMIDE 20 MG PO TABS
20.0000 mg | ORAL_TABLET | Freq: Every day | ORAL | Status: DC
  Administered 2015-01-01 – 2015-01-08 (×8): 20 mg via ORAL
  Filled 2014-12-31 (×8): qty 1

## 2014-12-31 MED ORDER — DEXTROSE-NACL 5-0.45 % IV SOLN
INTRAVENOUS | Status: DC
  Administered 2014-12-31: 13:00:00 via INTRAVENOUS

## 2014-12-31 MED ORDER — ONDANSETRON HCL 4 MG/2ML IJ SOLN: 4.0000 mg | Freq: Four times a day (QID) | INTRAMUSCULAR | Status: DC | PRN

## 2014-12-31 MED ORDER — ACETAMINOPHEN 650 MG RE SUPP: 650.0000 mg | Freq: Four times a day (QID) | RECTAL | Status: DC | PRN

## 2014-12-31 MED ORDER — POLYETHYLENE GLYCOL 3350 17 G PO PACK
17.0000 g | PACK | Freq: Every day | ORAL | Status: DC | PRN
  Administered 2015-01-02: 17 g via ORAL
  Filled 2014-12-31: qty 1

## 2014-12-31 MED ORDER — WARFARIN SODIUM 7.5 MG PO TABS
7.5000 mg | ORAL_TABLET | ORAL | Status: DC
Start: 2015-01-06 — End: 2015-01-01

## 2014-12-31 MED ORDER — ACETAMINOPHEN 325 MG PO TABS
650.0000 mg | ORAL_TABLET | Freq: Once | ORAL | Status: AC
  Administered 2014-12-31: 650 mg via ORAL
  Filled 2014-12-31: qty 2

## 2014-12-31 MED ORDER — ACETAMINOPHEN 325 MG PO TABS
650.0000 mg | ORAL_TABLET | Freq: Four times a day (QID) | ORAL | Status: DC | PRN
  Administered 2014-12-31 – 2015-01-01 (×2): 650 mg via ORAL
  Filled 2014-12-31 (×3): qty 2

## 2014-12-31 MED ORDER — QUINAPRIL HCL 10 MG PO TABS
20.0000 mg | ORAL_TABLET | Freq: Every day | ORAL | Status: DC
  Filled 2014-12-31: qty 2

## 2014-12-31 MED ORDER — WARFARIN SODIUM 5 MG PO TABS
5.0000 mg | ORAL_TABLET | ORAL | Status: DC
  Administered 2014-12-31: 5 mg via ORAL
  Filled 2014-12-31 (×2): qty 1

## 2014-12-31 NOTE — Discharge Instructions (Signed)
Abdominal Pain Take the antibiotics as prescribed. Follow up with Dr. Hassell Done tomorrow. Return to the ED if you develop new or worsening symptoms. Many things can cause abdominal pain. Usually, abdominal pain is not caused by a disease and will improve without treatment. It can often be observed and treated at home. Your health care provider will do a physical exam and possibly order blood tests and X-rays to help determine the seriousness of your pain. However, in many cases, more time must pass before a clear cause of the pain can be found. Before that point, your health care provider may not know if you need more testing or further treatment. HOME CARE INSTRUCTIONS  Monitor your abdominal pain for any changes. The following actions may help to alleviate any discomfort you are experiencing:  Only take over-the-counter or prescription medicines as directed by your health care provider.  Do not take laxatives unless directed to do so by your health care provider.  Try a clear liquid diet (broth, tea, or water) as directed by your health care provider. Slowly move to a bland diet as tolerated. SEEK MEDICAL CARE IF:  You have unexplained abdominal pain.  You have abdominal pain associated with nausea or diarrhea.  You have pain when you urinate or have a bowel movement.  You experience abdominal pain that wakes you in the night.  You have abdominal pain that is worsened or improved by eating food.  You have abdominal pain that is worsened with eating fatty foods.  You have a fever. SEEK IMMEDIATE MEDICAL CARE IF:   Your pain does not go away within 2 hours.  You keep throwing up (vomiting).  Your pain is felt only in portions of the abdomen, such as the right side or the left lower portion of the abdomen.  You pass bloody or black tarry stools. MAKE SURE YOU:  Understand these instructions.   Will watch your condition.   Will get help right away if you are not doing well or  get worse.  Document Released: 06/23/2005 Document Revised: 09/18/2013 Document Reviewed: 05/23/2013 [facility] Patient Information 2015 [place], [place]. This information is not intended to replace advice given to you by your health care provider. Make sure you discuss any questions you have with your health care provider.

## 2014-12-31 NOTE — Progress Notes (Signed)
Pt had influenza vaccine this past season.

## 2014-12-31 NOTE — Progress Notes (Signed)
Pt's CBG recently 322. In to discuss insulin coverage, pt admitted checking glucose with home meter about 2 hrs ago and covering the result (>300) with 30 units of rapid acting insulin from home. Pharmacy notified and consulted regarding recent CBG and ordered coverage. Pt advised of hospital policy and safety issues surrounding having home medication at bedside. Advised and encouraged to let wife take insulin kit w/meter and insulin home. Pt verbalized understanding yet concerned about glucose levels being in the 300's. Stated "When it's over 300 it makes me feel so bad." Pt reassured CBG checks were scheduled q4h and could be checked randomly if needed. Stated "Ok, I understand." Wife plans to carry kit home.

## 2014-12-31 NOTE — H&P (Signed)
Chief Complaint:  Fever, chills, panniculitis on CT  History of Present Illness:  Joseph [name]. is an 59 y.o. male who was seen in the ER last night with fever and chills and was found on CT to have panniculitis with possible undrained abscess on the abdominal wall.  Was sent home and called back in for admission and IV antibiotics.    Past Medical History  Diagnosis Date  . Morbid obesity   . Pure hypercholesterolemia   . Essential hypertension, benign   . 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 Esophageal reflux   . Type II or unspecified type diabetes mellitus without mention of complication, not stated as uncontrolled   . Anxiety state, unspecified   . Essential hypertension, benign   . Varicose veins of lower extremities with inflammation   . Peripheral vascular disease, unspecified   . Edema   . Type II or unspecified type diabetes mellitus with neurological manifestations, not stated as uncontrolled   . Polyneuropathy in diabetes(357.2)   . Personal history of PE (pulmonary embolism) 2004  . Depression   . Unspecified sleep apnea     uses CPAP  . Obstructive sleep apnea (adult) (pediatric)   . DVT (deep venous thrombosis) 2004  . Shortness of breath     DUE TO WEIGHT AND LOSS OF MOBILITY  . Myalgia and myositis, unspecified     BACK AND LEGS  . Falls frequently   . Poor venous access     PT STATES DIFFICULT TO DRAW BLOOD FROM HIS VEINS    Past Surgical History  Procedure Laterality Date  . No past surgeries    . No previous surgery    . Panniculectomy N/A 07/15/2014    Procedure: PANNICULECTOMY;  Surgeon: Pedro Earls, MD;  Location: [facility];  Service: General;  Laterality: N/A;    Current Facility-Administered Medications  Medication Dose Route Frequency Provider Last Rate Last Dose  . dextrose 5 %-0.45 % sodium chloride infusion   Intravenous Continuous Johnathan Hausen, MD 100 mL/hr at 12/31/14 1251      Review of patient's allergies indicates no known allergies. History reviewed. No pertinent family history. Social History:   reports that he has never smoked. He has never used smokeless tobacco. He reports that he does not drink alcohol or use illicit drugs.   REVIEW OF SYSTEMS : Negative except for see extensive problem list  Physical Exam:   Blood pressure 157/42, pulse 87, temperature 98.5 F (36.9 C), temperature source Oral, resp. rate 20, height _0  (1.854 m), weight 197.9 kg (436 lb 4.7 oz), SpO2 98 %. Body mass index is 57.57 kg/(m^2).  Gen:  WDWN WM NAD  Neurological: Alert and oriented to person, place, and time. Motor and sensory function is grossly intact  Head: Normocephalic and atraumatic.  Eyes: Conjunctivae are normal. Pupils are equal, round, and reactive to light. No scleral icterus.  Neck: Normal range of motion. Neck supple. No tracheal deviation or thyromegaly present.  Cardiovascular:  SR without murmurs or gallops.  No carotid bruits Breast:  Not examined Respiratory: Effort normal.  No respiratory distress. No chest wall tenderness. Breath sounds normal.  No wheezes, rales or rhonchi.  Abdomen:  Large pannus remaining with VAC on the left side and chronically draining area on the right GU:  Retracted genitalia  Musculoskeletal: Normal range of motion. Extremities are nontender. No cyanosis, edema or clubbing noted Lymphadenopathy: No cervical, preauricular, postauricular  or axillary adenopathy is present Skin: Skin is warm and dry. No rash noted. No diaphoresis. No erythema. No pallor. Pscyh: Normal mood and affect. Behavior is normal. Judgment and thought content normal.   LABORATORY RESULTS: Results for orders placed or performed during the hospital encounter of 12/30/14 (from the past 48 hour(s))  Urinalysis, Routine w reflex microscopic     Status: None   Collection Time: 12/30/14  8:32 PM  Result Value Ref Range   Color, Urine YELLOW YELLOW    APPearance CLEAR CLEAR   Specific Gravity, Urine 1.019 1.005 - 1.030   pH 5.5 5.0 - 8.0   Glucose, UA NEGATIVE NEGATIVE mg/dL   Hgb urine dipstick NEGATIVE NEGATIVE   Bilirubin Urine NEGATIVE NEGATIVE   Ketones, ur NEGATIVE NEGATIVE mg/dL   Protein, ur NEGATIVE NEGATIVE mg/dL   Urobilinogen, UA 0.2 0.0 - 1.0 mg/dL   Nitrite NEGATIVE NEGATIVE   Leukocytes, UA NEGATIVE NEGATIVE    Comment: MICROSCOPIC NOT DONE ON URINES WITH NEGATIVE PROTEIN, BLOOD, LEUKOCYTES, NITRITE, OR GLUCOSE <1000 mg/dL.  CBC with Differential/Platelet     Status: Abnormal   Collection Time: 12/30/14  8:33 PM  Result Value Ref Range   WBC 14.0 (H) 4.0 - 10.5 K/uL   RBC 4.34 4.22 - 5.81 MIL/uL   Hemoglobin 9.5 (L) 13.0 - 17.0 g/dL   HCT 32.0 (L) 39.0 - 52.0 %   MCV 73.7 (L) 78.0 - 100.0 fL   MCH 21.9 (L) 26.0 - 34.0 pg   MCHC 29.7 (L) 30.0 - 36.0 g/dL   RDW 18.6 (H) 11.5 - 15.5 %   Platelets 386 150 - 400 K/uL   Neutrophils Relative % 79 (H) 43 - 77 %   Neutro Abs 11.0 (H) 1.7 - 7.7 K/uL   Lymphocytes Relative 16 12 - 46 %   Lymphs Abs 2.2 0.7 - 4.0 K/uL   Monocytes Relative 5 3 - 12 %   Monocytes Absolute 0.7 0.1 - 1.0 K/uL   Eosinophils Relative 1 0 - 5 %   Eosinophils Absolute 0.1 0.0 - 0.7 K/uL   Basophils Relative 0 0 - 1 %   Basophils Absolute 0.0 0.0 - 0.1 K/uL   Smear Review MORPHOLOGY UNREMARKABLE   Basic metabolic panel     Status: Abnormal   Collection Time: 12/30/14  8:33 PM  Result Value Ref Range   Sodium 136 135 - 145 mmol/L   Potassium 3.5 3.5 - 5.1 mmol/L   Chloride 100 96 - 112 mmol/L   CO2 26 19 - 32 mmol/L   Glucose, Bld 178 (H) 70 - 99 mg/dL   BUN 31 (H) 6 - 23 mg/dL   Creatinine, Ser 1.38 (H) 0.50 - 1.35 mg/dL   Calcium 8.1 (L) 8.4 - 10.5 mg/dL   GFR calc non Af Amer 55 (L) >90 mL/min   GFR calc Af Amer 64 (L) >90 mL/min    Comment: (NOTE) The eGFR has been calculated using the CKD EPI equation. This calculation has not been validated in all clinical situations. eGFR's  persistently <90 mL/min signify possible Chronic Kidney Disease.    Anion gap 10 5 - 15  Protime-INR     Status: Abnormal   Collection Time: 12/30/14  8:33 PM  Result Value Ref Range   Prothrombin Time 20.2 (H) 11.6 - 15.2 seconds   INR 1.71 (H) 0.00 - 1.49  I-Stat CG4 Lactic Acid, ED     Status: None   Collection Time: 12/30/14  8:42 PM  Result Value Ref Range   Lactic Acid, Venous 1.73 0.5 - 2.0 mmol/L  POC occult blood, ED     Status: None   Collection Time: 12/31/14 12:48 AM  Result Value Ref Range   Fecal Occult Bld NEGATIVE NEGATIVE     RADIOLOGY RESULTS: Ct Abdomen Pelvis Wo Contrast  12/30/2014   CLINICAL DATA:  Intermittent fevers since Thursday. Wound VAC in place from a procedure in October. Generalized abdominal pain.  EXAM: CT ABDOMEN AND PELVIS WITHOUT CONTRAST  TECHNIQUE: Multidetector CT imaging of the abdomen and pelvis was performed following the standard protocol without IV contrast.  COMPARISON:  None.  FINDINGS: Mild atelectasis in the lung bases.  Prominent extrapleural fat.  Evaluation of solid organs and vascular structures is limited without IV contrast material. The unenhanced appearance of the liver, spleen, gallbladder, pancreas, adrenal glands, kidneys, inferior vena cava, and retroperitoneal lymph nodes is unremarkable. Mild calcification of the aorta without aneurysm. Stomach, small bowel, and colon are not abnormally distended. Mild central mesenteric stranding with mild prominence of lymph nodes. This is likely reactive or inflammatory. No free air or free fluid in the abdomen.  Pelvis: Bladder is decompressed without significant wall thickening. Prostate gland is not enlarged. No free or loculated pelvic fluid collections. No pelvic mass or lymphadenopathy. Appendix is normal. No evidence of diverticulitis. Prominent subcutaneous adipose tissues, incompletely included within the field of view. In the right lower anterior abdominal subcutaneous fat, there is  infiltration with skin defect extending deep into the subcutaneous fat of the panniculus. There is a gas and fluid collection within the subcutaneous fat measuring about 4.2 x 7.3 cm. This suggests an abscess with fistula. Linear infiltration inferiorly extends across the midline to the left subcutaneous tissues. This may be along the surgical incision site. Diffuse infiltration of the inferior fatty tissues with skin thickening. Surgical drain in the left side. Mild degenerative changes in the spine. No destructive bone lesions.  IMPRESSION: Gas and fluid collection within the subcutaneous fat of the right lower anterior abdominal wall associated with infiltration and skin defect consistent with cellulitis, abscess and fistula, likely associated with postoperative site. Infiltration at in the mesentery with mild prominence of mesenteric lymph nodes, likely reactive or inflammatory. No bowel obstruction.   Electronically Signed   By: Lucienne Capers M.D.   On: 12/30/2014 22:37   Dg Chest 2 View  12/30/2014   CLINICAL DATA:  Intermittent fevers since Thursday.  EXAM: CHEST  2 VIEW  COMPARISON:  07/08/2014  FINDINGS: Scarring in the left lung base unchanged since prior study. Normal heart size and pulmonary vascularity. No focal airspace disease or consolidation in the lungs. No blunting of costophrenic angles. No pneumothorax. Mediastinal contours appear intact.  IMPRESSION: No active cardiopulmonary disease.   Electronically Signed   By: Lucienne Capers M.D.   On: 12/30/2014 21:29    Problem List: Patient Active Problem List   Diagnosis Date Noted  . Panniculitis 03/22/2014    Priority: High  . Cellulitis and abscess of trunk 12/31/2014  . S/P IVC filter   . Status post panniculectomy Oct 2015 07/17/2014  . Morbid obesity BMI 65 03/22/2014  . Pure hypercholesterolemia 03/22/2014  . Essential hypertension, benign 03/22/2014  . Diabetes 03/22/2014  . OSA (obstructive sleep apnea) 03/22/2014  . Hx  pulmonary embolism 03/22/2014    Assessment & Plan: Morbid obesity and panniculitis following panniculectomy 5 months ago,.      Matt B. Hassell Done, MD, [facility] [phone] beeper [phone]  12/31/2014 2:43 PM

## 2015-01-01 LAB — BASIC METABOLIC PANEL
Anion gap: 10 (ref 5–15)
BUN: 36 mg/dL — ABNORMAL HIGH (ref 6–23)
CALCIUM: 7.7 mg/dL — AB (ref 8.4–10.5)
CO2: 24 mmol/L (ref 19–32)
CREATININE: 1.52 mg/dL — AB (ref 0.50–1.35)
Chloride: 101 mmol/L (ref 96–112)
GFR calc Af Amer: 57 mL/min — ABNORMAL LOW (ref >90)
GFR calc non Af Amer: 49 mL/min — ABNORMAL LOW (ref >90)
Glucose, Bld: 244 mg/dL — ABNORMAL HIGH (ref 70–99)
Potassium: 3.8 mmol/L (ref 3.5–5.1)
Sodium: 135 mmol/L (ref 135–145)

## 2015-01-01 LAB — CBC
HCT: 30.7 % — ABNORMAL LOW (ref 39.0–52.0)
Hemoglobin: 9.1 g/dL — ABNORMAL LOW (ref 13.0–17.0)
MCH: 21.9 pg — AB (ref 26.0–34.0)
MCHC: 29.6 g/dL — ABNORMAL LOW (ref 30.0–36.0)
MCV: 74 fL — AB (ref 78.0–100.0)
PLATELETS: 366 10*3/uL (ref 150–400)
RBC: 4.15 MIL/uL — ABNORMAL LOW (ref 4.22–5.81)
RDW: 19 % — AB (ref 11.5–15.5)
WBC: 16.8 10*3/uL — ABNORMAL HIGH (ref 4.0–10.5)

## 2015-01-01 LAB — GLUCOSE, CAPILLARY
GLUCOSE-CAPILLARY: 212 mg/dL — AB (ref 70–99)
GLUCOSE-CAPILLARY: 284 mg/dL — AB (ref 70–99)
Glucose-Capillary: 186 mg/dL — ABNORMAL HIGH (ref 70–99)
Glucose-Capillary: 202 mg/dL — ABNORMAL HIGH (ref 70–99)
Glucose-Capillary: 235 mg/dL — ABNORMAL HIGH (ref 70–99)
Glucose-Capillary: 317 mg/dL — ABNORMAL HIGH (ref 70–99)
Glucose-Capillary: 362 mg/dL — ABNORMAL HIGH (ref 70–99)

## 2015-01-01 LAB — PROTIME-INR
INR: 2.35 — AB (ref 0.00–1.49)
PROTHROMBIN TIME: 26 s — AB (ref 11.6–15.2)

## 2015-01-01 LAB — HEMOGLOBIN A1C
HEMOGLOBIN A1C: 7.4 % — AB (ref 4.8–5.6)
MEAN PLASMA GLUCOSE: 166 mg/dL

## 2015-01-01 MED ORDER — PIOGLITAZONE HCL 45 MG PO TABS
45.0000 mg | ORAL_TABLET | Freq: Every day | ORAL | Status: DC
  Administered 2015-01-01 – 2015-01-08 (×8): 45 mg via ORAL
  Filled 2015-01-01 (×9): qty 1

## 2015-01-01 MED ORDER — SODIUM CHLORIDE 0.45 % IV SOLN
INTRAVENOUS | Status: DC
  Administered 2015-01-01 – 2015-01-04 (×5): via INTRAVENOUS
  Administered 2015-01-04 – 2015-01-05 (×2): 100 mL/h via INTRAVENOUS
  Administered 2015-01-06 – 2015-01-07 (×3): via INTRAVENOUS

## 2015-01-01 MED ORDER — INSULIN GLARGINE 100 UNIT/ML ~~LOC~~ SOLN
50.0000 [IU] | Freq: Every day | SUBCUTANEOUS | Status: DC
  Administered 2015-01-01 – 2015-01-07 (×7): 50 [IU] via SUBCUTANEOUS
  Filled 2015-01-01 (×10): qty 0.5

## 2015-01-01 MED ORDER — ACETAMINOPHEN 650 MG RE SUPP: 650.0000 mg | RECTAL | Status: DC | PRN

## 2015-01-01 MED ORDER — WARFARIN SODIUM 2.5 MG PO TABS
2.5000 mg | ORAL_TABLET | Freq: Once | ORAL | Status: AC
  Administered 2015-01-01: 2.5 mg via ORAL
  Filled 2015-01-01: qty 1

## 2015-01-01 MED ORDER — ACETAMINOPHEN 325 MG PO TABS
650.0000 mg | ORAL_TABLET | ORAL | Status: DC | PRN
  Administered 2015-01-01 – 2015-01-02 (×3): 650 mg via ORAL
  Filled 2015-01-01 (×2): qty 2

## 2015-01-01 MED ORDER — METFORMIN HCL 500 MG PO TABS
1000.0000 mg | ORAL_TABLET | Freq: Two times a day (BID) | ORAL | Status: DC
  Filled 2015-01-01 (×3): qty 2

## 2015-01-01 MED ORDER — LISINOPRIL 20 MG PO TABS
20.0000 mg | ORAL_TABLET | Freq: Every day | ORAL | Status: DC
  Administered 2015-01-01 – 2015-01-08 (×8): 20 mg via ORAL
  Filled 2015-01-01 (×8): qty 1

## 2015-01-01 NOTE — Progress Notes (Addendum)
ANTICOAGULATION CONSULT NOTE - Initial Consult  Pharmacy Consult for coumadin Indication: hx PE/DVT  No Known Allergies  Patient Measurements: Height: 6\' 1"  (185.4 cm) Weight: (!) 436 lb 4.7 oz (197.9 kg) IBW/kg (Calculated) : 79.9  Vital Signs: Temp: 99.4 F (37.4 C) (04/06 0532) Temp Source: Oral (04/06 0532) BP: 139/44 mmHg (04/06 0532) Pulse Rate: 93 (04/06 0532)  Labs:  Recent Labs  12/30/14 2033 01/01/15 0515  HGB 9.5* 9.1*  HCT 32.0* 30.7*  PLT 386 366  LABPROT 20.2* 26.0*  INR 1.71* 2.35*  CREATININE 1.38* 1.52*    Estimated Creatinine Clearance: 96.4 mL/min (by C-G formula based on Cr of 1.52).   Medical History: Past Medical History  Diagnosis Date  . Morbid obesity   . Pure hypercholesterolemia   . Essential hypertension, benign   . 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 Esophageal reflux   . Type II or unspecified type diabetes mellitus without mention of complication, not stated as uncontrolled   . Anxiety state, unspecified   . Essential hypertension, benign   . Varicose veins of lower extremities with inflammation   . Peripheral vascular disease, unspecified   . Edema   . Type II or unspecified type diabetes mellitus with neurological manifestations, not stated as uncontrolled   . Polyneuropathy in diabetes(357.2)   . Personal history of PE (pulmonary embolism) 2004  . Depression   . Unspecified sleep apnea     uses CPAP  . Obstructive sleep apnea (adult) (pediatric)   . DVT (deep venous thrombosis) 2004  . Shortness of breath     DUE TO WEIGHT AND LOSS OF MOBILITY  . Myalgia and myositis, unspecified     BACK AND LEGS  . Falls frequently   . Poor venous access     PT STATES DIFFICULT TO DRAW BLOOD FROM HIS VEINS   Assessment: Patient is a 59 y.o m on coumadin PTA for hx PE/DVT.  He presented to [facility] with panniculitis and currently on abx for infection. Pharmacy is consulted to  manage coumadin therapy for patient.  Home dose: 5mg  daily except 7.5mg  on Mondays.   Today, 01/01/15:   INR increased from 1.71 to 2.35 after coumadin 5mg  dose given last night  hgb low but stable, plt ok  No bleeding documented  No significant drug-drug intxns  Goal of Therapy:  INR 2-3    Plan:  - With noticeable jump in INR over night, will reduce coumadin dose to 2.5mg  PO x1 today - daily INR - d/c lovenox 40mg  since INR is >2  Daena Alper P 01/01/2015,8:55 AM

## 2015-01-01 NOTE — Progress Notes (Signed)
[facility]  Patient Status: Active (receiving services up to time of hospitalization)  [facility] is providing the following services: RN  If patient discharges after hours, please call [phone].   Kristen Hayworth 01/01/2015, 10:03 AM

## 2015-01-01 NOTE — Consult Note (Signed)
WOC wound consult note Reason for Consult: Nonhealing surgical wounds.  Panniculectomy 06/2014.  Wound type: Nonhealing surgical wound to abdominal pannus.   Measurement: Left flank 0.5 cm x 0.5 cm x 0.1 draining lesion with minimal serosanguinous drainage.  Right flank 1 cm x 0.5 cm x 3 cm tunneling lesion with minimal purulent drainage.  Mid abdomen. Left of umbilicus 1.5 cm x 1 cm x 6.5 cm with minimal serosanguinous drainage Right of umbilicus (linear excoriation noted to periwound at 7 o'clock) 1 cm x 1 cm x 5 cm Wound bed: 100% ruddy red on all wounds.  Drainage (amount, consistency, odor) See above.  No odor. Periwound: Linear excoriation noted  Dressing procedure/placement/frequency: Cleanse wounds to right left flank of abdomen with NS and pat gently dry.  Apply dry dressing to left flank.  Gently fill right flank wound with NS moistened strip gauze.  Cover with ABD pad and secure with tape.  Change daily.   NPWT (VAC) dressing to midline abdominal wounds.  See NPWT orders.  Window paned periwound with drape for protection.  Both wounds filled with white VersaFoam and black foam bridged between the two.  Covered with drape and seal obtained at 125 mmHg   WOC team will continue to follow NPWT dressing change for MON-WED-FRI.  Daily dressing change can be performed by bedside nurse.  Domenic Moras RN BSN [place] Pager [phone]

## 2015-01-01 NOTE — Progress Notes (Signed)
Patient very upset for 4pm cbg to be 362. Pt request endocrinologist consult. Message left with Dr Earlie Server office nurse concerning cbg results and pts request.

## 2015-01-01 NOTE — Progress Notes (Signed)
Patient ID: Joseph Monje., male   DOB: [birth year], 59 y.o.   MRN: [id number] [facility] Surgery Progress Note:   * No surgery found *  Subjective: Mental status is clear.  Sitting up and stating that he is feeling much better.  Wants metformin and actos restarted and increased Lantus .   Objective: Vital signs in last 24 hours: Temp:  [98 F (36.7 C)-100.5 F (38.1 C)] 99.4 F (37.4 C) (04/06 0532) Pulse Rate:  [70-97] 93 (04/06 0532) Resp:  [18-20] 18 (04/06 0532) BP: (118-157)/(29-44) 139/44 mmHg (04/06 0532) SpO2:  [93 %-98 %] 93 % (04/06 0532) Weight:  [197.9 kg (436 lb 4.7 oz)] 197.9 kg (436 lb 4.7 oz) (04/05 1146)  Intake/Output from previous day: 04/05 0701 - 04/06 0700 In: 915 [I.V.:915] Out: 950 [Urine:950] Intake/Output this shift:    Physical Exam: Work of breathing is not labored or elevated.  Pannus is less tender.    Lab Results:  Results for orders placed or performed during the hospital encounter of 12/31/14 (from the past 48 hour(s))  Glucose, capillary     Status: Abnormal   Collection Time: 12/31/14  4:10 PM  Result Value Ref Range   Glucose-Capillary 322 (H) 70 - 99 mg/dL   Comment 1 Notify RN   Glucose, capillary     Status: Abnormal   Collection Time: 12/31/14  8:09 PM  Result Value Ref Range   Glucose-Capillary 173 (H) 70 - 99 mg/dL  Glucose, capillary     Status: Abnormal   Collection Time: 12/31/14  9:11 PM  Result Value Ref Range   Glucose-Capillary 179 (H) 70 - 99 mg/dL  Glucose, capillary     Status: Abnormal   Collection Time: 01/01/15 12:45 AM  Result Value Ref Range   Glucose-Capillary 186 (H) 70 - 99 mg/dL  Glucose, capillary     Status: Abnormal   Collection Time: 01/01/15  4:59 AM  Result Value Ref Range   Glucose-Capillary 212 (H) 70 - 99 mg/dL  Basic metabolic panel     Status: Abnormal   Collection Time: 01/01/15  5:15 AM  Result Value Ref Range   Sodium 135 135 - 145 mmol/L   Potassium 3.8 3.5 - 5.1 mmol/L   Chloride  101 96 - 112 mmol/L   CO2 24 19 - 32 mmol/L   Glucose, Bld 244 (H) 70 - 99 mg/dL   BUN 36 (H) 6 - 23 mg/dL   Creatinine, Ser 1.52 (H) 0.50 - 1.35 mg/dL   Calcium 7.7 (L) 8.4 - 10.5 mg/dL   GFR calc non Af Amer 49 (L) >90 mL/min   GFR calc Af Amer 57 (L) >90 mL/min    Comment: (NOTE) The eGFR has been calculated using the CKD EPI equation. This calculation has not been validated in all clinical situations. eGFR's persistently <90 mL/min signify possible Chronic Kidney Disease.    Anion gap 10 5 - 15  Protime-INR     Status: Abnormal   Collection Time: 01/01/15  5:15 AM  Result Value Ref Range   Prothrombin Time 26.0 (H) 11.6 - 15.2 seconds   INR 2.35 (H) 0.00 - 1.49  CBC     Status: Abnormal   Collection Time: 01/01/15  5:15 AM  Result Value Ref Range   WBC 16.8 (H) 4.0 - 10.5 K/uL   RBC 4.15 (L) 4.22 - 5.81 MIL/uL   Hemoglobin 9.1 (L) 13.0 - 17.0 g/dL   HCT 30.7 (L) 39.0 - 52.0 %  MCV 74.0 (L) 78.0 - 100.0 fL   MCH 21.9 (L) 26.0 - 34.0 pg   MCHC 29.6 (L) 30.0 - 36.0 g/dL   RDW 19.0 (H) 11.5 - 15.5 %   Platelets 366 150 - 400 K/uL  Glucose, capillary     Status: Abnormal   Collection Time: 01/01/15  7:28 AM  Result Value Ref Range   Glucose-Capillary 235 (H) 70 - 99 mg/dL    Radiology/Results: Ct Abdomen Pelvis Wo Contrast  12/30/2014   CLINICAL DATA:  Intermittent fevers since Thursday. Wound VAC in place from a procedure in October. Generalized abdominal pain.  EXAM: CT ABDOMEN AND PELVIS WITHOUT CONTRAST  TECHNIQUE: Multidetector CT imaging of the abdomen and pelvis was performed following the standard protocol without IV contrast.  COMPARISON:  None.  FINDINGS: Mild atelectasis in the lung bases.  Prominent extrapleural fat.  Evaluation of solid organs and vascular structures is limited without IV contrast material. The unenhanced appearance of the liver, spleen, gallbladder, pancreas, adrenal glands, kidneys, inferior vena cava, and retroperitoneal lymph nodes is  unremarkable. Mild calcification of the aorta without aneurysm. Stomach, small bowel, and colon are not abnormally distended. Mild central mesenteric stranding with mild prominence of lymph nodes. This is likely reactive or inflammatory. No free air or free fluid in the abdomen.  Pelvis: Bladder is decompressed without significant wall thickening. Prostate gland is not enlarged. No free or loculated pelvic fluid collections. No pelvic mass or lymphadenopathy. Appendix is normal. No evidence of diverticulitis. Prominent subcutaneous adipose tissues, incompletely included within the field of view. In the right lower anterior abdominal subcutaneous fat, there is infiltration with skin defect extending deep into the subcutaneous fat of the panniculus. There is a gas and fluid collection within the subcutaneous fat measuring about 4.2 x 7.3 cm. This suggests an abscess with fistula. Linear infiltration inferiorly extends across the midline to the left subcutaneous tissues. This may be along the surgical incision site. Diffuse infiltration of the inferior fatty tissues with skin thickening. Surgical drain in the left side. Mild degenerative changes in the spine. No destructive bone lesions.  IMPRESSION: Gas and fluid collection within the subcutaneous fat of the right lower anterior abdominal wall associated with infiltration and skin defect consistent with cellulitis, abscess and fistula, likely associated with postoperative site. Infiltration at in the mesentery with mild prominence of mesenteric lymph nodes, likely reactive or inflammatory. No bowel obstruction.   Electronically Signed   By: Lucienne Capers M.D.   On: 12/30/2014 22:37   Dg Chest 2 View  12/30/2014   CLINICAL DATA:  Intermittent fevers since Thursday.  EXAM: CHEST  2 VIEW  COMPARISON:  07/08/2014  FINDINGS: Scarring in the left lung base unchanged since prior study. Normal heart size and pulmonary vascularity. No focal airspace disease or  consolidation in the lungs. No blunting of costophrenic angles. No pneumothorax. Mediastinal contours appear intact.  IMPRESSION: No active cardiopulmonary disease.   Electronically Signed   By: Lucienne Capers M.D.   On: 12/30/2014 21:29    Anti-infectives: Anti-infectives    Start     Dose/Rate Route Frequency Ordered Stop   12/31/14 1700  piperacillin-tazobactam (ZOSYN) IVPB 3.375 g     3.375 g 12.5 mL/hr over 240 Minutes Intravenous Every 8 hours 12/31/14 1536        Assessment/Plan: Problem List: Patient Active Problem List   Diagnosis Date Noted  . Panniculitis 03/22/2014    Priority: High  . Cellulitis and abscess of trunk 12/31/2014  .  S/P IVC filter   . Status post panniculectomy Oct 2015 07/17/2014  . Morbid obesity BMI 65 03/22/2014  . Pure hypercholesterolemia 03/22/2014  . Essential hypertension, benign 03/22/2014  . Diabetes 03/22/2014  . OSA (obstructive sleep apnea) 03/22/2014  . Hx pulmonary embolism 03/22/2014    WBC is more elevated.  PT therapeutic.  Feeling better.  Continue IV antibiotics and observation * No surgery found *    LOS: 1 day   Matt B. Hassell Done, MD, [facility] [phone] beeper [phone]  01/01/2015 8:21 AM

## 2015-01-02 DIAGNOSIS — E119 Type 2 diabetes mellitus without complications: Secondary | ICD-10-CM

## 2015-01-02 DIAGNOSIS — M793 Panniculitis, unspecified: Secondary | ICD-10-CM

## 2015-01-02 DIAGNOSIS — T8149XA Infection following a procedure, other surgical site, initial encounter: Secondary | ICD-10-CM | POA: Diagnosis present

## 2015-01-02 DIAGNOSIS — K219 Gastro-esophageal reflux disease without esophagitis: Secondary | ICD-10-CM | POA: Diagnosis present

## 2015-01-02 DIAGNOSIS — E1140 Type 2 diabetes mellitus with diabetic neuropathy, unspecified: Secondary | ICD-10-CM

## 2015-01-02 DIAGNOSIS — E1142 Type 2 diabetes mellitus with diabetic polyneuropathy: Secondary | ICD-10-CM | POA: Diagnosis present

## 2015-01-02 LAB — CBC WITH DIFFERENTIAL/PLATELET
BASOS PCT: 0 % (ref 0–1)
Basophils Absolute: 0 10*3/uL (ref 0.0–0.1)
Eosinophils Absolute: 0.2 10*3/uL (ref 0.0–0.7)
Eosinophils Relative: 1 % (ref 0–5)
HCT: 26.6 % — ABNORMAL LOW (ref 39.0–52.0)
Hemoglobin: 7.9 g/dL — ABNORMAL LOW (ref 13.0–17.0)
Lymphocytes Relative: 8 % — ABNORMAL LOW (ref 12–46)
Lymphs Abs: 1.4 10*3/uL (ref 0.7–4.0)
MCH: 22 pg — AB (ref 26.0–34.0)
MCHC: 29.7 g/dL — AB (ref 30.0–36.0)
MCV: 74.1 fL — AB (ref 78.0–100.0)
MONOS PCT: 7 % (ref 3–12)
Monocytes Absolute: 1.2 10*3/uL — ABNORMAL HIGH (ref 0.1–1.0)
NEUTROS ABS: 14.4 10*3/uL — AB (ref 1.7–7.7)
Neutrophils Relative %: 84 % — ABNORMAL HIGH (ref 43–77)
Platelets: 381 10*3/uL (ref 150–400)
RBC: 3.59 MIL/uL — ABNORMAL LOW (ref 4.22–5.81)
RDW: 19 % — AB (ref 11.5–15.5)
WBC: 17.2 10*3/uL — ABNORMAL HIGH (ref 4.0–10.5)

## 2015-01-02 LAB — PROTIME-INR
INR: 2.16 — ABNORMAL HIGH (ref 0.00–1.49)
PROTHROMBIN TIME: 24.2 s — AB (ref 11.6–15.2)

## 2015-01-02 LAB — BASIC METABOLIC PANEL
ANION GAP: 12 (ref 5–15)
BUN: 38 mg/dL — ABNORMAL HIGH (ref 6–23)
CO2: 23 mmol/L (ref 19–32)
Calcium: 7.6 mg/dL — ABNORMAL LOW (ref 8.4–10.5)
Chloride: 100 mmol/L (ref 96–112)
Creatinine, Ser: 1.76 mg/dL — ABNORMAL HIGH (ref 0.50–1.35)
GFR calc Af Amer: 48 mL/min — ABNORMAL LOW (ref >90)
GFR calc non Af Amer: 41 mL/min — ABNORMAL LOW (ref >90)
GLUCOSE: 227 mg/dL — AB (ref 70–99)
Potassium: 3.6 mmol/L (ref 3.5–5.1)
Sodium: 135 mmol/L (ref 135–145)

## 2015-01-02 LAB — GLUCOSE, CAPILLARY
GLUCOSE-CAPILLARY: 200 mg/dL — AB (ref 70–99)
GLUCOSE-CAPILLARY: 230 mg/dL — AB (ref 70–99)
GLUCOSE-CAPILLARY: 257 mg/dL — AB (ref 70–99)
Glucose-Capillary: 210 mg/dL — ABNORMAL HIGH (ref 70–99)
Glucose-Capillary: 264 mg/dL — ABNORMAL HIGH (ref 70–99)

## 2015-01-02 MED ORDER — INSULIN ASPART 100 UNIT/ML ~~LOC~~ SOLN
6.0000 [IU] | Freq: Three times a day (TID) | SUBCUTANEOUS | Status: DC
  Administered 2015-01-03 – 2015-01-04 (×5): 6 [IU] via SUBCUTANEOUS

## 2015-01-02 MED ORDER — VANCOMYCIN HCL 10 G IV SOLR
1500.0000 mg | Freq: Two times a day (BID) | INTRAVENOUS | Status: DC
  Administered 2015-01-03 (×2): 1500 mg via INTRAVENOUS
  Filled 2015-01-02 (×2): qty 1500

## 2015-01-02 MED ORDER — VANCOMYCIN HCL 10 G IV SOLR
2500.0000 mg | Freq: Once | INTRAVENOUS | Status: AC
  Administered 2015-01-02: 2500 mg via INTRAVENOUS
  Filled 2015-01-02: qty 2500

## 2015-01-02 MED ORDER — WARFARIN - PHARMACIST DOSING INPATIENT
Freq: Every day | Status: DC
  Administered 2015-01-06 – 2015-01-07 (×2)

## 2015-01-02 MED ORDER — WARFARIN SODIUM 4 MG PO TABS
4.0000 mg | ORAL_TABLET | Freq: Once | ORAL | Status: AC
  Administered 2015-01-02: 4 mg via ORAL
  Filled 2015-01-02: qty 1

## 2015-01-02 NOTE — Consult Note (Addendum)
WOC follow-up: Dr Hassell Done in earlier and opened a new tunneling wound to right abd.  Requested to apply Vac dressing to this site, in addition to the dressings which were applied yesterday.  Right outer lower abd with full thickness wound, .5X.5X9cm.  Narrow opening; unable to visualize wound.  Mod amt yellow drainage and no odor.  1 piece white sponge applied using swab to fill.  Applied drape, then black foam for track pad.  Y connector applied and suction on at 163mm cont suction.  Tolerated without c/o pain.  Plan for [facility] team to change all Vac dressings tomorrow. Julien Girt MSN, RN, [place], [place], [place]

## 2015-01-02 NOTE — Progress Notes (Signed)
Patient ID: Joseph Beneke., male   DOB: 1956-01-29, 59 y.o.   MRN: [id number] [facility] Surgery Progress Note:   * No surgery found *  Subjective: Mental status is clear.   Objective: Vital signs in last 24 hours: Temp:  [100.3 F (37.9 C)-102.1 F (38.9 C)] 100.3 F (37.9 C) (04/07 0545) Pulse Rate:  [78-94] 78 (04/07 0545) Resp:  [18] 18 (04/07 0545) BP: (132-142)/(39-46) 142/45 mmHg (04/07 0545) SpO2:  [93 %-96 %] 96 % (04/07 0545)  Intake/Output from previous day: 04/06 0701 - 04/07 0700 In: 1560 [P.O.:360; I.V.:1200] Out: 1120 [Urine:1100; Drains:20] Intake/Output this shift:    Physical Exam: Work of breathing is not labored.  Used CPAP during the night.  Right sinus probed with Q tip and this went in several inches.  Liquified fat/pus drains out.  This is area seen on CT.  Will see if VAC can be applied to this sinus tract.    Lab Results:  Results for orders placed or performed during the hospital encounter of 12/31/14 (from the past 48 hour(s))  Glucose, capillary     Status: Abnormal   Collection Time: 12/31/14  4:10 PM  Result Value Ref Range   Glucose-Capillary 322 (H) 70 - 99 mg/dL   Comment 1 Notify RN   Hemoglobin A1c     Status: Abnormal   Collection Time: 12/31/14  4:50 PM  Result Value Ref Range   Hgb A1c MFr Bld 7.4 (H) 4.8 - 5.6 %    Comment: (NOTE)         Pre-diabetes: 5.7 - 6.4         Diabetes: >6.4         Glycemic control for adults with diabetes: <7.0    Mean Plasma Glucose 166 mg/dL    Comment: (NOTE) Performed At: [facility] [place], [place] [id number] Lindon Romp MD ZD:[id number]   Glucose, capillary     Status: Abnormal   Collection Time: 12/31/14  8:09 PM  Result Value Ref Range   Glucose-Capillary 173 (H) 70 - 99 mg/dL  Glucose, capillary     Status: Abnormal   Collection Time: 12/31/14  9:11 PM  Result Value Ref Range   Glucose-Capillary 179 (H) 70 - 99 mg/dL  Glucose, capillary     Status:  Abnormal   Collection Time: 01/01/15 12:45 AM  Result Value Ref Range   Glucose-Capillary 186 (H) 70 - 99 mg/dL  Glucose, capillary     Status: Abnormal   Collection Time: 01/01/15  4:59 AM  Result Value Ref Range   Glucose-Capillary 212 (H) 70 - 99 mg/dL  Basic metabolic panel     Status: Abnormal   Collection Time: 01/01/15  5:15 AM  Result Value Ref Range   Sodium 135 135 - 145 mmol/L   Potassium 3.8 3.5 - 5.1 mmol/L   Chloride 101 96 - 112 mmol/L   CO2 24 19 - 32 mmol/L   Glucose, Bld 244 (H) 70 - 99 mg/dL   BUN 36 (H) 6 - 23 mg/dL   Creatinine, Ser 1.52 (H) 0.50 - 1.35 mg/dL   Calcium 7.7 (L) 8.4 - 10.5 mg/dL   GFR calc non Af Amer 49 (L) >90 mL/min   GFR calc Af Amer 57 (L) >90 mL/min    Comment: (NOTE) The eGFR has been calculated using the CKD EPI equation. This calculation has not been validated in all clinical situations. eGFR's persistently <90 mL/min signify possible Chronic Kidney  Disease.    Anion gap 10 5 - 15  Protime-INR     Status: Abnormal   Collection Time: 01/01/15  5:15 AM  Result Value Ref Range   Prothrombin Time 26.0 (H) 11.6 - 15.2 seconds   INR 2.35 (H) 0.00 - 1.49  CBC     Status: Abnormal   Collection Time: 01/01/15  5:15 AM  Result Value Ref Range   WBC 16.8 (H) 4.0 - 10.5 K/uL   RBC 4.15 (L) 4.22 - 5.81 MIL/uL   Hemoglobin 9.1 (L) 13.0 - 17.0 g/dL   HCT 30.7 (L) 39.0 - 52.0 %   MCV 74.0 (L) 78.0 - 100.0 fL   MCH 21.9 (L) 26.0 - 34.0 pg   MCHC 29.6 (L) 30.0 - 36.0 g/dL   RDW 19.0 (H) 11.5 - 15.5 %   Platelets 366 150 - 400 K/uL  Glucose, capillary     Status: Abnormal   Collection Time: 01/01/15  7:28 AM  Result Value Ref Range   Glucose-Capillary 235 (H) 70 - 99 mg/dL  Glucose, capillary     Status: Abnormal   Collection Time: 01/01/15 11:47 AM  Result Value Ref Range   Glucose-Capillary 284 (H) 70 - 99 mg/dL  Glucose, capillary     Status: Abnormal   Collection Time: 01/01/15  3:52 PM  Result Value Ref Range   Glucose-Capillary  362 (H) 70 - 99 mg/dL  Glucose, capillary     Status: Abnormal   Collection Time: 01/01/15  7:32 PM  Result Value Ref Range   Glucose-Capillary 317 (H) 70 - 99 mg/dL  Glucose, capillary     Status: Abnormal   Collection Time: 01/01/15 11:46 PM  Result Value Ref Range   Glucose-Capillary 202 (H) 70 - 99 mg/dL  Glucose, capillary     Status: Abnormal   Collection Time: 01/02/15  4:10 AM  Result Value Ref Range   Glucose-Capillary 210 (H) 70 - 99 mg/dL  Protime-INR     Status: Abnormal   Collection Time: 01/02/15  5:02 AM  Result Value Ref Range   Prothrombin Time 24.2 (H) 11.6 - 15.2 seconds   INR 2.16 (H) 0.00 - 1.60  Basic metabolic panel     Status: Abnormal   Collection Time: 01/02/15  5:02 AM  Result Value Ref Range   Sodium 135 135 - 145 mmol/L   Potassium 3.6 3.5 - 5.1 mmol/L   Chloride 100 96 - 112 mmol/L   CO2 23 19 - 32 mmol/L   Glucose, Bld 227 (H) 70 - 99 mg/dL   BUN 38 (H) 6 - 23 mg/dL   Creatinine, Ser 1.76 (H) 0.50 - 1.35 mg/dL   Calcium 7.6 (L) 8.4 - 10.5 mg/dL   GFR calc non Af Amer 41 (L) >90 mL/min   GFR calc Af Amer 48 (L) >90 mL/min    Comment: (NOTE) The eGFR has been calculated using the CKD EPI equation. This calculation has not been validated in all clinical situations. eGFR's persistently <90 mL/min signify possible Chronic Kidney Disease.    Anion gap 12 5 - 15  Glucose, capillary     Status: Abnormal   Collection Time: 01/02/15  7:42 AM  Result Value Ref Range   Glucose-Capillary 200 (H) 70 - 99 mg/dL    Radiology/Results: No results found.  Anti-infectives: Anti-infectives    Start     Dose/Rate Route Frequency Ordered Stop   12/31/14 1700  piperacillin-tazobactam (ZOSYN) IVPB 3.375 g     3.375  g 12.5 mL/hr over 240 Minutes Intravenous Every 8 hours 12/31/14 1536        Assessment/Plan: Problem List: Patient Active Problem List   Diagnosis Date Noted  . Panniculitis 03/22/2014    Priority: High  . Cellulitis and abscess of trunk  12/31/2014  . S/P IVC filter   . Status post panniculectomy Oct 2015 07/17/2014  . Morbid obesity BMI 65 03/22/2014  . Pure hypercholesterolemia 03/22/2014  . Essential hypertension, benign 03/22/2014  . Diabetes 03/22/2014  . OSA (obstructive sleep apnea) 03/22/2014  . Hx pulmonary embolism 03/22/2014    Temp to 102 last night.  Blood cultures negative.  Pannus nontender.  Cr up today,  Blood sugar is elevated.   Will see about VAC for right sinus.  Request ID help on antibiotic selection.   * No surgery found *    LOS: 2 days   Matt B. Hassell Done, MD, [facility] [phone] beeper [phone]  01/02/2015 9:31 AM

## 2015-01-02 NOTE — Progress Notes (Signed)
Patient ID: Joseph [name]., male   DOB: [birth year], 59 y.o.   MRN: [id number]         [place] [facility] Infect[facility]    Date of Admission:  12/31/2014           Day 3 piperacillin tazobactam       Reason for Consult: Abdominal wall abscess    Referring Physician: Dr. Kaylyn Lim  Principal Problem:   Abscess of postoperative wound of abdominal wall Active Problems:   Status post panniculectomy Oct 2015   Morbid obesity BMI 65   Essential hypertension, benign   Diabetes   Panniculitis   OSA (obstructive sleep apnea)   Hx pulmonary embolism   S/P IVC filter-temporary REMOVED   Esophageal reflux   Polyneuropathy in diabetes(357.2)   . amLODipine  5 mg Oral Daily  . docusate sodium  100 mg Oral BID  . FLUoxetine  40 mg Oral QHS  . furosemide  20 mg Oral Daily  . indapamide  5 mg Oral Daily  . insulin aspart  0-20 Units Subcutaneous 6 times per day  . insulin glargine  50 Units Subcutaneous QHS  . lisinopril  20 mg Oral Daily  . pantoprazole (PROTONIX) IV  40 mg Intravenous QHS  . pioglitazone  45 mg Oral Daily  . piperacillin-tazobactam (ZOSYN)  IV  3.375 g Intravenous Q8H  . warfarin  4 mg Oral ONCE-1800  . Warfarin - Pharmacist Dosing Inpatient   Does not apply q1800    Recommendations: 1. Continue piperacillin tazobactam 2. Restart vancomycin   Assessment: Mr. Iacobelli is had difficulty with wound healing since his panniculectomy last October. He now has wound infection with an abscess. Hopefully the new VAC dressing will provide adequate drainage of the abscess and allow antibiotics to be more effective. I will restart vancomycin for MRSA coverage and continue piperacillin tazobactam for gram-negative rod and anaerobic coverage.    HPI: Joseph [name]. is a 59 y.o. male with morbid obesity and a history of recurrent panniculitis. He underwent panniculectomy last October. He has had problems with wound healing ever since. He developed some recurrent abdominal  wall cellulitis and received a seven-day course of amoxicillin clavulanate in December. He had a VAC dressing placed to a lower midline wound that was not healing and January. Normally it had very high output of serous fluid. The past 2 weeks the output decreased dramatically. Over the past week he began to develop fever, chills and extreme fatigue. He came to the emergency department on 12/30/2014. The CT scan showed an abscess collection leading to admission. Admission blood cultures are negative. He received 1 dose of IV vancomycin and then was placed on piperacillin tazobactam. He is not feeling any better and continues to have a high fever and worsening leukocytosis.   Review of Systems: Constitutional: positive for chills, fevers and malaise, negative for sweats and weight loss Eyes: negative Ears, nose, mouth, throat, and face: negative Respiratory: negative Cardiovascular: negative Gastrointestinal: negative Genitourinary:negative  Past Medical History  Diagnosis Date  . Morbid obesity   . Pure hypercholesterolemia   . Essential hypertension, benign   . 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 Esophageal reflux   . Type II or unspecified type diabetes mellitus without mention of complication, not stated as uncontrolled   . Anxiety state, unspecified   . Essential hypertension, benign   . Varicose veins of lower extremities with inflammation   .  Peripheral vascular disease, unspecified   . Edema   . Type II or unspecified type diabetes mellitus with neurological manifestations, not stated as uncontrolled   . Polyneuropathy in diabetes(357.2)   . Personal history of PE (pulmonary embolism) 2004  . Depression   . Unspecified sleep apnea     uses CPAP  . Obstructive sleep apnea (adult) (pediatric)   . DVT (deep venous thrombosis) 2004  . Shortness of breath     DUE TO WEIGHT AND LOSS OF MOBILITY  . Myalgia and myositis,  unspecified     BACK AND LEGS  . Falls frequently   . Poor venous access     PT STATES DIFFICULT TO DRAW BLOOD FROM HIS VEINS    History  Substance Use Topics  . Smoking status: Never Smoker   . Smokeless tobacco: Never Used  . Alcohol Use: No    History reviewed. No pertinent family history. No Known Allergies  OBJECTIVE: Blood pressure 142/56, pulse 94, temperature 101.3 F (38.5 C), temperature source Oral, resp. rate 18, height 6\' 1"  (1.854 m), weight 436 lb 4.7 oz (197.9 kg), SpO2 93 %. General: He appears uncomfortable and worn out Skin: No rash Lungs: Clear Cor: Regular S1 and S2 with distant heart sounds with no murmur Abdomen: Obese. He has a new VAC dressing in the right lower quadrant overlying the sinus tract that appears to connect with the abscess seen on the CT scan. He has another VAC dressing on the lower midline wound. A left flank wound is dressed with gauze.   Lab Results Lab Results  Component Value Date   WBC 17.2* 01/02/2015   HGB 7.9* 01/02/2015   HCT 26.6* 01/02/2015   MCV 74.1* 01/02/2015   PLT 381 01/02/2015    Lab Results  Component Value Date   CREATININE 1.76* 01/02/2015   BUN 38* 01/02/2015   NA 135 01/02/2015   K 3.6 01/02/2015   CL 100 01/02/2015   CO2 23 01/02/2015    Lab Results  Component Value Date   ALT 17 07/05/2008   AST 16 07/05/2008   ALKPHOS 75 07/05/2008   BILITOT 0.7 07/05/2008     Microbiology: Recent Results (from the past 240 hour(s))  Blood culture (routine x 2)     Status: None (Preliminary result)   Collection Time: 12/30/14  8:17 PM  Result Value Ref Range Status   Specimen Description BLOOD LEFT HAND  Final   Special Requests BOTTLES DRAWN AEROBIC ONLY 3 CC  Final   Culture   Final           BLOOD CULTURE RECEIVED NO GROWTH TO DATE CULTURE WILL BE HELD FOR 5 DAYS BEFORE ISSUING A FINAL NEGATIVE REPORT Performed at [facility]    Report Status PENDING  Incomplete  Blood culture (routine x 2)      Status: None (Preliminary result)   Collection Time: 12/30/14  8:33 PM  Result Value Ref Range Status   Specimen Description BLOOD LEFT FOREARM  Final   Special Requests BOTTLES DRAWN AEROBIC AND ANAEROBIC 4CC  Final   Culture   Final           BLOOD CULTURE RECEIVED NO GROWTH TO DATE CULTURE WILL BE HELD FOR 5 DAYS BEFORE ISSUING A FINAL NEGATIVE REPORT Performed at [facility]    Report Status PENDING  Incomplete   Abdominal CT scan 12/30/2014  IMPRESSION: Gas and fluid collection within the subcutaneous fat of the right lower anterior abdominal  wall associated with infiltration and skin defect consistent with cellulitis, abscess and fistula, likely associated with postoperative site. Infiltration at in the mesentery with mild prominence of mesenteric lymph nodes, likely reactive or inflammatory. No bowel obstruction.   Electronically Signed  By: Lucienne Capers M.D.  On: 12/30/2014 22:37   Michel Bickers, [place] [phone] pager   [phone] cell 01/02/2015, 3:02 PM

## 2015-01-02 NOTE — Progress Notes (Signed)
Inpatient Diabetes Program Recommendations  AACE/ADA: New Consensus Statement on Inpatient Glycemic Control (2013)  Target Ranges:  Prepandial:   less than 140 mg/dL      Peak postprandial:   less than 180 mg/dL (1-2 hours)      Critically ill patients:  140 - 180 mg/dL     Results for JACOBY, MATSUNO (MRN [id number]) as of 01/02/2015 10:55  Ref. Range 01/01/2015 00:45 01/01/2015 04:59 01/01/2015 07:28 01/01/2015 11:47 01/01/2015 15:52 01/01/2015 19:32  Glucose-Capillary Latest Range: 70-99 mg/dL 186 (H) 212 (H) 235 (H) 284 (H) 362 (H) 317 (H)    Results for SEYDINA, BAALMAN (MRN [id number]) as of 01/02/2015 10:55  Ref. Range 01/01/2015 23:46 01/02/2015 04:10 01/02/2015 07:42  Glucose-Capillary Latest Range: 70-99 mg/dL 202 (H) 210 (H) 200 (H)     Admit with: Panniculitis  History: Type 2 DM, Morbid Obesity  Home DM Meds: Lantus 50 units QHS        Novolog 0-30 units tid per SSI       Metformin 1000 mg bid        Actos 45 mg daily  Current Orders: Lantus 50 units QHS       Novolog Resistant SSI Q4 hours      Actos 45 mg daily   **Note Lantus increased to 50 units QHS last PM.  **Patient is eating 100% of meals.    MD- Please consider the following in-hospital insulin adjustments:  1. Change Novolog Resistant SSI to tid ac + HS (currently ordered as Q4 hours and patient eating PO diet)  2. Add Novolog Meal Coverage- Novolog 6 units tid with meals to start      Will follow Wyn Quaker RN, MSN, CDE Diabetes Coordinator Inpatient Diabetes Program Team Pager: [phone] (8a-5p)

## 2015-01-02 NOTE — Progress Notes (Signed)
ANTIBIOTIC CONSULT NOTE - INITIAL  Pharmacy Consult for Vancomycin Indication: wound infection  No Known Allergies  Patient Measurements: Height: 6\' 1"  (185.4 cm) Weight: (!) 436 lb 4.7 oz (197.9 kg) IBW/kg (Calculated) : 79.9  Vital Signs: Temp: 101.3 F (38.5 C) (04/07 1445) Temp Source: Oral (04/07 1445) BP: 142/56 mmHg (04/07 1445) Pulse Rate: 94 (04/07 1445) Intake/Output from previous day: 04/06 0701 - 04/07 0700 In: 1560 [P.O.:360; I.V.:1200] Out: 1120 [Urine:1100; Drains:20] Intake/Output from this shift: Total I/O In: 1580 [P.O.:480; I.V.:800; IV Piggyback:300] Out: 400 [Urine:400]  Labs:  Recent Labs  12/30/14 2033 01/01/15 0515 01/02/15 0502  WBC 14.0* 16.8* 17.2*  HGB 9.5* 9.1* 7.9*  PLT 386 366 381  CREATININE 1.38* 1.52* 1.76*   Estimated Creatinine Clearance: 83.2 mL/min (by C-G formula based on Cr of 1.76). No results for input(s): VANCOTROUGH, VANCOPEAK, VANCORANDOM, GENTTROUGH, GENTPEAK, GENTRANDOM, TOBRATROUGH, TOBRAPEAK, TOBRARND, AMIKACINPEAK, AMIKACINTROU, AMIKACIN in the last 72 hours.   Microbiology: Recent Results (from the past 720 hour(s))  Blood culture (routine x 2)     Status: None (Preliminary result)   Collection Time: 12/30/14  8:17 PM  Result Value Ref Range Status   Specimen Description BLOOD LEFT HAND  Final   Special Requests BOTTLES DRAWN AEROBIC ONLY 3 CC  Final   Culture   Final           BLOOD CULTURE RECEIVED NO GROWTH TO DATE CULTURE WILL BE HELD FOR 5 DAYS BEFORE ISSUING A FINAL NEGATIVE REPORT Performed at [facility]    Report Status PENDING  Incomplete  Blood culture (routine x 2)     Status: None (Preliminary result)   Collection Time: 12/30/14  8:33 PM  Result Value Ref Range Status   Specimen Description BLOOD LEFT FOREARM  Final   Special Requests BOTTLES DRAWN AEROBIC AND ANAEROBIC 4CC  Final   Culture   Final           BLOOD CULTURE RECEIVED NO GROWTH TO DATE CULTURE WILL BE HELD FOR 5 DAYS  BEFORE ISSUING A FINAL NEGATIVE REPORT Performed at [facility]    Report Status PENDING  Incomplete   Medical History: Past Medical History  Diagnosis Date  . Morbid obesity   . Pure hypercholesterolemia   . Essential hypertension, benign   . 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 Esophageal reflux   . Type II or unspecified type diabetes mellitus without mention of complication, not stated as uncontrolled   . Anxiety state, unspecified   . Essential hypertension, benign   . Varicose veins of lower extremities with inflammation   . Peripheral vascular disease, unspecified   . Edema   . Type II or unspecified type diabetes mellitus with neurological manifestations, not stated as uncontrolled   . Polyneuropathy in diabetes(357.2)   . Personal history of PE (pulmonary embolism) 2004  . Depression   . Unspecified sleep apnea     uses CPAP  . Obstructive sleep apnea (adult) (pediatric)   . DVT (deep venous thrombosis) 2004  . Shortness of breath     DUE TO WEIGHT AND LOSS OF MOBILITY  . Myalgia and myositis, unspecified     BACK AND LEGS  . Falls frequently   . Poor venous access     PT STATES DIFFICULT TO DRAW BLOOD FROM HIS VEINS   Medications:  Scheduled:  . amLODipine  5 mg Oral Daily  . docusate sodium  100 mg  Oral BID  . FLUoxetine  40 mg Oral QHS  . furosemide  20 mg Oral Daily  . indapamide  5 mg Oral Daily  . insulin aspart  0-20 Units Subcutaneous 6 times per day  . insulin glargine  50 Units Subcutaneous QHS  . lisinopril  20 mg Oral Daily  . pantoprazole (PROTONIX) IV  40 mg Intravenous QHS  . pioglitazone  45 mg Oral Daily  . piperacillin-tazobactam (ZOSYN)  IV  3.375 g Intravenous Q8H  . vancomycin  2,500 mg Intravenous Once  . warfarin  4 mg Oral ONCE-1800  . Warfarin - Pharmacist Dosing Inpatient   Does not apply q1800   Anti-infectives    Start     Dose/Rate Route Frequency Ordered Stop    01/02/15 1545  vancomycin (VANCOCIN) 2,500 mg in sodium chloride 0.9 % 500 mL IVPB     2,500 mg 250 mL/hr over 120 Minutes Intravenous  Once 01/02/15 1542     12/31/14 1700  piperacillin-tazobactam (ZOSYN) IVPB 3.375 g     3.375 g 12.5 mL/hr over 240 Minutes Intravenous Every 8 hours 12/31/14 1536       Assessment: 46 yoM s/p panniculectomy 10/15, difficulty with wound healing, placed wound VAC 1/16, admit 4/5 for decreased VAC output, abscess and fistula noted on CT. 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   Admit 4/5 started on Zosyn, add Vancomycin per ID MD, pharmacy to dose  SCr 1.76, Cl ~ 85 ml/min CG, but < 50 ml/min normalized, SCr increasing since admit  Goal of Therapy:  Vancomycin trough level 15-20 mcg/ml  Plan:   Vancomycin 2500mg  x1 followed by 1500mg  q12  Continue Zosyn at 3.375gm q8hr-4 hr infusion  Monitor renal function closely  Obtain trough at steady state  Minda Ditto PharmD Pager [phone] 01/02/2015, 5:17 PM

## 2015-01-02 NOTE — Progress Notes (Signed)
ANTICOAGULATION CONSULT NOTE - Initial Consult  Pharmacy Consult for coumadin Indication: hx PE/DVT  No Known Allergies  Patient Measurements: Height: 6\' 1"  (185.4 cm) Weight: (!) 436 lb 4.7 oz (197.9 kg) IBW/kg (Calculated) : 79.9  Vital Signs: Temp: 100.3 F (37.9 C) (04/07 0545) Temp Source: Oral (04/07 0545) BP: 142/45 mmHg (04/07 0545) Pulse Rate: 78 (04/07 0545)  Labs:  Recent Labs  12/30/14 2033 01/01/15 0515 01/02/15 0502  HGB 9.5* 9.1*  --   HCT 32.0* 30.7*  --   PLT 386 366  --   LABPROT 20.2* 26.0* 24.2*  INR 1.71* 2.35* 2.16*  CREATININE 1.38* 1.52* 1.76*    Estimated Creatinine Clearance: 83.2 mL/min (by C-G formula based on Cr of 1.76).   Medical History: Past Medical History  Diagnosis Date  . Morbid obesity   . Pure hypercholesterolemia   . Essential hypertension, benign   . 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 Esophageal reflux   . Type II or unspecified type diabetes mellitus without mention of complication, not stated as uncontrolled   . Anxiety state, unspecified   . Essential hypertension, benign   . Varicose veins of lower extremities with inflammation   . Peripheral vascular disease, unspecified   . Edema   . Type II or unspecified type diabetes mellitus with neurological manifestations, not stated as uncontrolled   . Polyneuropathy in diabetes(357.2)   . Personal history of PE (pulmonary embolism) 2004  . Depression   . Unspecified sleep apnea     uses CPAP  . Obstructive sleep apnea (adult) (pediatric)   . DVT (deep venous thrombosis) 2004  . Shortness of breath     DUE TO WEIGHT AND LOSS OF MOBILITY  . Myalgia and myositis, unspecified     BACK AND LEGS  . Falls frequently   . Poor venous access     PT STATES DIFFICULT TO DRAW BLOOD FROM HIS VEINS   Assessment: Patient is a 59 y.o m on coumadin PTA for hx PE/DVT.  He [facility] to [facility] with panniculitis and currently  on abx for infection. Pharmacy is consulted to manage coumadin therapy for patient.  Home dose: 5mg  daily except 7.5mg  on Mondays.   Today, 01/01/15:   INR 2.16, decreased from 2.35  hgb low but stable, plt ok  No bleeding documented  No significant drug-drug intxns  Consumed 100% normal diet yesterday  Goal of Therapy:  INR 2-3    Plan:  - warfarin 4mg  po x1 @ 1800 - daily INR   Dolly Rias RPh 01/02/2015, 10:25 AM Pager [phone]

## 2015-01-03 ENCOUNTER — Inpatient Hospital Stay (HOSPITAL_COMMUNITY): Payer: PPO

## 2015-01-03 DIAGNOSIS — G4733 Obstructive sleep apnea (adult) (pediatric): Secondary | ICD-10-CM

## 2015-01-03 DIAGNOSIS — K219 Gastro-esophageal reflux disease without esophagitis: Secondary | ICD-10-CM

## 2015-01-03 DIAGNOSIS — Z86711 Personal history of pulmonary embolism: Secondary | ICD-10-CM

## 2015-01-03 DIAGNOSIS — E1142 Type 2 diabetes mellitus with diabetic polyneuropathy: Secondary | ICD-10-CM

## 2015-01-03 DIAGNOSIS — T814XXD Infection following a procedure, subsequent encounter: Secondary | ICD-10-CM

## 2015-01-03 DIAGNOSIS — N179 Acute kidney failure, unspecified: Secondary | ICD-10-CM

## 2015-01-03 DIAGNOSIS — I10 Essential (primary) hypertension: Secondary | ICD-10-CM

## 2015-01-03 DIAGNOSIS — N289 Disorder of kidney and ureter, unspecified: Secondary | ICD-10-CM

## 2015-01-03 LAB — CBC WITH DIFFERENTIAL/PLATELET
BASOS PCT: 0 % (ref 0–1)
Basophils Absolute: 0 10*3/uL (ref 0.0–0.1)
EOS ABS: 0 10*3/uL (ref 0.0–0.7)
EOS PCT: 0 % (ref 0–5)
HCT: 28.7 % — ABNORMAL LOW (ref 39.0–52.0)
Hemoglobin: 8.5 g/dL — ABNORMAL LOW (ref 13.0–17.0)
LYMPHS ABS: 1.1 10*3/uL (ref 0.7–4.0)
Lymphocytes Relative: 6 % — ABNORMAL LOW (ref 12–46)
MCH: 21.7 pg — AB (ref 26.0–34.0)
MCHC: 29.6 g/dL — AB (ref 30.0–36.0)
MCV: 73.4 fL — ABNORMAL LOW (ref 78.0–100.0)
Monocytes Absolute: 1.3 10*3/uL — ABNORMAL HIGH (ref 0.1–1.0)
Monocytes Relative: 7 % (ref 3–12)
NEUTROS ABS: 16.5 10*3/uL — AB (ref 1.7–7.7)
NEUTROS PCT: 87 % — AB (ref 43–77)
Platelets: 409 10*3/uL — ABNORMAL HIGH (ref 150–400)
RBC: 3.91 MIL/uL — AB (ref 4.22–5.81)
RDW: 19.1 % — ABNORMAL HIGH (ref 11.5–15.5)
WBC: 18.9 10*3/uL — AB (ref 4.0–10.5)

## 2015-01-03 LAB — VANCOMYCIN, TROUGH: Vancomycin Tr: 28.6 ug/mL (ref 10.0–20.0)

## 2015-01-03 LAB — PROTIME-INR
INR: 2.25 — AB (ref 0.00–1.49)
PROTHROMBIN TIME: 25 s — AB (ref 11.6–15.2)

## 2015-01-03 LAB — BASIC METABOLIC PANEL
ANION GAP: 13 (ref 5–15)
BUN: 46 mg/dL — ABNORMAL HIGH (ref 6–23)
CHLORIDE: 99 mmol/L (ref 96–112)
CO2: 23 mmol/L (ref 19–32)
CREATININE: 2.16 mg/dL — AB (ref 0.50–1.35)
Calcium: 7.8 mg/dL — ABNORMAL LOW (ref 8.4–10.5)
GFR calc non Af Amer: 32 mL/min — ABNORMAL LOW (ref >90)
GFR, EST AFRICAN AMERICAN: 37 mL/min — AB (ref >90)
Glucose, Bld: 204 mg/dL — ABNORMAL HIGH (ref 70–99)
Potassium: 3.6 mmol/L (ref 3.5–5.1)
SODIUM: 135 mmol/L (ref 135–145)

## 2015-01-03 LAB — GLUCOSE, CAPILLARY
GLUCOSE-CAPILLARY: 153 mg/dL — AB (ref 70–99)
Glucose-Capillary: 164 mg/dL — ABNORMAL HIGH (ref 70–99)
Glucose-Capillary: 168 mg/dL — ABNORMAL HIGH (ref 70–99)
Glucose-Capillary: 174 mg/dL — ABNORMAL HIGH (ref 70–99)
Glucose-Capillary: 184 mg/dL — ABNORMAL HIGH (ref 70–99)
Glucose-Capillary: 186 mg/dL — ABNORMAL HIGH (ref 70–99)

## 2015-01-03 MED ORDER — MENTHOL 3 MG MT LOZG
1.0000 | LOZENGE | OROMUCOSAL | Status: DC | PRN
  Filled 2015-01-03: qty 9

## 2015-01-03 MED ORDER — WARFARIN SODIUM 4 MG PO TABS
4.0000 mg | ORAL_TABLET | Freq: Once | ORAL | Status: AC
  Administered 2015-01-03: 4 mg via ORAL
  Filled 2015-01-03: qty 1

## 2015-01-03 MED ORDER — SALINE SPRAY 0.65 % NA SOLN
1.0000 | NASAL | Status: DC | PRN
  Filled 2015-01-03: qty 44

## 2015-01-03 MED ORDER — CEPASTAT 14.5 MG MT LOZG
1.0000 | LOZENGE | OROMUCOSAL | Status: DC | PRN
  Filled 2015-01-03: qty 9

## 2015-01-03 NOTE — Progress Notes (Signed)
ANTICOAGULATION CONSULT NOTE - Initial Consult  Pharmacy Consult for coumadin Indication: hx PE/DVT  No Known Allergies  Patient Measurements: Height: 6\' 1"  (185.4 cm) Weight: (!) 436 lb 4.7 oz (197.9 kg) IBW/kg (Calculated) : 79.9  Vital Signs: Temp: 99 F (37.2 C) (04/08 0540) Temp Source: Oral (04/08 0540) BP: 130/42 mmHg (04/08 0540) Pulse Rate: 92 (04/08 0540)  Labs:  Recent Labs  01/01/15 0515 01/02/15 0502 01/03/15 0415  HGB 9.1* 7.9* 8.5*  HCT 30.7* 26.6* 28.7*  PLT 366 381 409*  LABPROT 26.0* 24.2* 25.0*  INR 2.35* 2.16* 2.25*  CREATININE 1.52* 1.76* 2.16*    Estimated Creatinine Clearance: 67.8 mL/min (by C-G formula based on Cr of 2.16).   Medical History: Past Medical History  Diagnosis Date  . Morbid obesity   . Pure hypercholesterolemia   . Essential hypertension, benign   . 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 Esophageal reflux   . Type II or unspecified type diabetes mellitus without mention of complication, not stated as uncontrolled   . Anxiety state, unspecified   . Essential hypertension, benign   . Varicose veins of lower extremities with inflammation   . Peripheral vascular disease, unspecified   . Edema   . Type II or unspecified type diabetes mellitus with neurological manifestations, not stated as uncontrolled   . Polyneuropathy in diabetes(357.2)   . Personal history of PE (pulmonary embolism) 2004  . Depression   . Unspecified sleep apnea     uses CPAP  . Obstructive sleep apnea (adult) (pediatric)   . DVT (deep venous thrombosis) 2004  . Shortness of breath     DUE TO WEIGHT AND LOSS OF MOBILITY  . Myalgia and myositis, unspecified     BACK AND LEGS  . Falls frequently   . Poor venous access     PT STATES DIFFICULT TO DRAW BLOOD FROM HIS VEINS   Assessment: Patient is a 59 y.o m on coumadin PTA for hx PE/DVT.  He presented to [facility] with panniculitis and currently on  abx for infection. Pharmacy is consulted to manage coumadin therapy for patient.  Home dose: 5mg  daily except 7.5mg  on Mondays.   Today, 01/03/15:   INR 2.25  hgb low but stable, plt ok  No bleeding documented  No significant drug-drug intxns  Consumed 100%  diet   Scr 2.16 ( increasing)  Goal of Therapy:  INR 2-3    Plan:  - warfarin 4mg  po x1 @ 1800 - daily INR   Dolly Rias RPh 01/03/2015, 11:07 AM Pager [phone]

## 2015-01-03 NOTE — Progress Notes (Signed)
ANTIBIOTIC CONSULT NOTE - INITIAL  Pharmacy Consult for Vancomycin Indication: wound infection  No Known Allergies  Patient Measurements: Height: 6\' 1"  (185.4 cm) Weight: (!) 436 lb 4.7 oz (197.9 kg) IBW/kg (Calculated) : 79.9  Vital Signs: Temp: 99 F (37.2 C) (04/08 0540) Temp Source: Oral (04/08 0540) BP: 130/42 mmHg (04/08 0540) Pulse Rate: 92 (04/08 0540) Intake/Output from previous day: 04/07 0701 - 04/08 0700 In: 3990 [P.O.:840; I.V.:2300; IV Piggyback:850] Out: 1100 [Urine:1100] Intake/Output from this shift: Total I/O In: 570 [P.O.:120; I.V.:400; IV Piggyback:50] Out: 350 [Urine:300; Drains:50]  Labs:  Recent Labs  01/01/15 0515 01/02/15 0502 01/03/15 0415  WBC 16.8* 17.2* 18.9*  HGB 9.1* 7.9* 8.5*  PLT 366 381 409*  CREATININE 1.52* 1.76* 2.16*   Estimated Creatinine Clearance: 67.8 mL/min (by C-G formula based on Cr of 2.16). No results for input(s): VANCOTROUGH, VANCOPEAK, VANCORANDOM, GENTTROUGH, GENTPEAK, GENTRANDOM, TOBRATROUGH, TOBRAPEAK, TOBRARND, AMIKACINPEAK, AMIKACINTROU, AMIKACIN in the last 72 hours.   Microbiology: Recent Results (from the past 720 hour(s))  Blood culture (routine x 2)     Status: None (Preliminary result)   Collection Time: 12/30/14  8:17 PM  Result Value Ref Range Status   Specimen Description BLOOD LEFT HAND  Final   Special Requests BOTTLES DRAWN AEROBIC ONLY 3 CC  Final   Culture   Final           BLOOD CULTURE RECEIVED NO GROWTH TO DATE CULTURE WILL BE HELD FOR 5 DAYS BEFORE ISSUING A FINAL NEGATIVE REPORT Performed at [facility]    Report Status PENDING  Incomplete  Blood culture (routine x 2)     Status: None (Preliminary result)   Collection Time: 12/30/14  8:33 PM  Result Value Ref Range Status   Specimen Description BLOOD LEFT FOREARM  Final   Special Requests BOTTLES DRAWN AEROBIC AND ANAEROBIC 4CC  Final   Culture   Final           BLOOD CULTURE RECEIVED NO GROWTH TO DATE CULTURE WILL BE HELD  FOR 5 DAYS BEFORE ISSUING A FINAL NEGATIVE REPORT Performed at [facility]    Report Status PENDING  Incomplete   Medical History: Past Medical History  Diagnosis Date  . Morbid obesity   . Pure hypercholesterolemia   . Essential hypertension, benign   . 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 Esophageal reflux   . Type II or unspecified type diabetes mellitus without mention of complication, not stated as uncontrolled   . Anxiety state, unspecified   . Essential hypertension, benign   . Varicose veins of lower extremities with inflammation   . Peripheral vascular disease, unspecified   . Edema   . Type II or unspecified type diabetes mellitus with neurological manifestations, not stated as uncontrolled   . Polyneuropathy in diabetes(357.2)   . Personal history of PE (pulmonary embolism) 2004  . Depression   . Unspecified sleep apnea     uses CPAP  . Obstructive sleep apnea (adult) (pediatric)   . DVT (deep venous thrombosis) 2004  . Shortness of breath     DUE TO WEIGHT AND LOSS OF MOBILITY  . Myalgia and myositis, unspecified     BACK AND LEGS  . Falls frequently   . Poor venous access     PT STATES DIFFICULT TO DRAW BLOOD FROM HIS VEINS   Medications:  Scheduled:  . amLODipine  5 mg Oral Daily  . docusate sodium  100 mg Oral BID  . FLUoxetine  40 mg Oral QHS  . furosemide  20 mg Oral Daily  . indapamide  5 mg Oral Daily  . insulin aspart  0-20 Units Subcutaneous 6 times per day  . insulin aspart  6 Units Subcutaneous TID WC  . insulin glargine  50 Units Subcutaneous QHS  . lisinopril  20 mg Oral Daily  . pantoprazole (PROTONIX) IV  40 mg Intravenous QHS  . pioglitazone  45 mg Oral Daily  . piperacillin-tazobactam (ZOSYN)  IV  3.375 g Intravenous Q8H  . vancomycin  1,500 mg Intravenous Q12H  . warfarin  4 mg Oral ONCE-1800  . Warfarin - Pharmacist Dosing Inpatient   Does not apply q1800    Anti-infectives    Start     Dose/Rate Route Frequency Ordered Stop   01/03/15 0600  vancomycin (VANCOCIN) 1,500 mg in sodium chloride 0.9 % 500 mL IVPB     1,500 mg 250 mL/hr over 120 Minutes Intravenous Every 12 hours 01/02/15 2147     01/02/15 1545  vancomycin (VANCOCIN) 2,500 mg in sodium chloride 0.9 % 500 mL IVPB     2,500 mg 250 mL/hr over 120 Minutes Intravenous  Once 01/02/15 1542 01/02/15 1821   12/31/14 1700  piperacillin-tazobactam (ZOSYN) IVPB 3.375 g     3.375 g 12.5 mL/hr over 240 Minutes Intravenous Every 8 hours 12/31/14 1536       Assessment: 62 yoM s/p panniculectomy 10/15, difficulty with wound healing, placed wound VAC 1/16, admit 4/5 for decreased VAC output, abscess and fistula noted on CT. 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   Admit 4/5 started on Zosyn, add Vancomycin per ID MD, pharmacy to dose  SCr increasing since admit   Today 4/8:  Scr 2.16  Goal of Therapy:  Vancomycin trough level 15-20 mcg/ml  Plan:   Continue vancomycin 1500mg  q12  vanc trough tonight before 1800 dose  Monitor renal function closely   Dolly Rias RPh 01/03/2015, 3:04 PM Pager [phone]

## 2015-01-03 NOTE — Progress Notes (Signed)
Patient complains of dryness from his home CPAP and wishes to add humidification. RT offered a hospital supplied machine with humidification and patient initially agreed. Eduction provided that immediate relief may not be felt and patient decided against changing equipment, but states he will call his wife early this morning to bring his home humidifier for use during this hospital stay. RT will continue to follow.

## 2015-01-03 NOTE — Progress Notes (Signed)
Patient ID: Joseph Canoy., male   DOB: 1956/03/14, 59 y.o.   MRN: [id number] [facility] Surgery Progress Note:   * No surgery found *  Subjective: Mental status is clear.   Objective: Vital signs in last 24 hours: Temp:  [99 F (37.2 C)-101.3 F (38.5 C)] 99 F (37.2 C) (04/08 0540) Pulse Rate:  [92-94] 92 (04/08 0540) Resp:  [18] 18 (04/08 0540) BP: (130-142)/(42-56) 130/42 mmHg (04/08 0540) SpO2:  [90 %-93 %] 90 % (04/08 0830)  Intake/Output from previous day: 04/07 0701 - 04/08 0700 In: 3990 [P.O.:840; I.V.:2300; IV Piggyback:850] Out: 1100 [Urine:1100] Intake/Output this shift: Total I/O In: 570 [P.O.:120; I.V.:400; IV Piggyback:50] Out: 350 [Urine:300; Drains:50]  Physical Exam: Work of breathing is more today.  Fluctuant area in RLQ involving the inferior flap-aspirated and cultured.  Old infected hematoma.  Dilated to two fingerbreaths and evacuated.  Will apply wound vac to cavity.  This should be the source of his WBC and fever.  Lab Results:  Results for orders placed or performed during the hospital encounter of 12/31/14 (from the past 48 hour(s))  Glucose, capillary     Status: Abnormal   Collection Time: 01/01/15  3:52 PM  Result Value Ref Range   Glucose-Capillary 362 (H) 70 - 99 mg/dL  Glucose, capillary     Status: Abnormal   Collection Time: 01/01/15  7:32 PM  Result Value Ref Range   Glucose-Capillary 317 (H) 70 - 99 mg/dL  Glucose, capillary     Status: Abnormal   Collection Time: 01/01/15 11:46 PM  Result Value Ref Range   Glucose-Capillary 202 (H) 70 - 99 mg/dL  Glucose, capillary     Status: Abnormal   Collection Time: 01/02/15  4:10 AM  Result Value Ref Range   Glucose-Capillary 210 (H) 70 - 99 mg/dL  Protime-INR     Status: Abnormal   Collection Time: 01/02/15  5:02 AM  Result Value Ref Range   Prothrombin Time 24.2 (H) 11.6 - 15.2 seconds   INR 2.16 (H) 0.00 - 5.30  Basic metabolic panel     Status: Abnormal   Collection Time: 01/02/15   5:02 AM  Result Value Ref Range   Sodium 135 135 - 145 mmol/L   Potassium 3.6 3.5 - 5.1 mmol/L   Chloride 100 96 - 112 mmol/L   CO2 23 19 - 32 mmol/L   Glucose, Bld 227 (H) 70 - 99 mg/dL   BUN 38 (H) 6 - 23 mg/dL   Creatinine, Ser 1.76 (H) 0.50 - 1.35 mg/dL   Calcium 7.6 (L) 8.4 - 10.5 mg/dL   GFR calc non Af Amer 41 (L) >90 mL/min   GFR calc Af Amer 48 (L) >90 mL/min    Comment: (NOTE) The eGFR has been calculated using the CKD EPI equation. This calculation has not been validated in all clinical situations. eGFR's persistently <90 mL/min signify possible Chronic Kidney Disease.    Anion gap 12 5 - 15  CBC with Differential/Platelet     Status: Abnormal   Collection Time: 01/02/15  5:02 AM  Result Value Ref Range   WBC 17.2 (H) 4.0 - 10.5 K/uL   RBC 3.59 (L) 4.22 - 5.81 MIL/uL   Hemoglobin 7.9 (L) 13.0 - 17.0 g/dL   HCT 26.6 (L) 39.0 - 52.0 %   MCV 74.1 (L) 78.0 - 100.0 fL   MCH 22.0 (L) 26.0 - 34.0 pg   MCHC 29.7 (L) 30.0 - 36.0 g/dL   RDW  19.0 (H) 11.5 - 15.5 %   Platelets 381 150 - 400 K/uL   Neutrophils Relative % 84 (H) 43 - 77 %   Lymphocytes Relative 8 (L) 12 - 46 %   Monocytes Relative 7 3 - 12 %   Eosinophils Relative 1 0 - 5 %   Basophils Relative 0 0 - 1 %   Neutro Abs 14.4 (H) 1.7 - 7.7 K/uL   Lymphs Abs 1.4 0.7 - 4.0 K/uL   Monocytes Absolute 1.2 (H) 0.1 - 1.0 K/uL   Eosinophils Absolute 0.2 0.0 - 0.7 K/uL   Basophils Absolute 0.0 0.0 - 0.1 K/uL   Smear Review MORPHOLOGY UNREMARKABLE   Glucose, capillary     Status: Abnormal   Collection Time: 01/02/15  7:42 AM  Result Value Ref Range   Glucose-Capillary 200 (H) 70 - 99 mg/dL  Glucose, capillary     Status: Abnormal   Collection Time: 01/02/15 11:29 AM  Result Value Ref Range   Glucose-Capillary 230 (H) 70 - 99 mg/dL  Glucose, capillary     Status: Abnormal   Collection Time: 01/02/15  4:36 PM  Result Value Ref Range   Glucose-Capillary 257 (H) 70 - 99 mg/dL  Glucose, capillary     Status: Abnormal    Collection Time: 01/02/15  7:23 PM  Result Value Ref Range   Glucose-Capillary 264 (H) 70 - 99 mg/dL  Glucose, capillary     Status: Abnormal   Collection Time: 01/03/15 12:01 AM  Result Value Ref Range   Glucose-Capillary 186 (H) 70 - 99 mg/dL  Glucose, capillary     Status: Abnormal   Collection Time: 01/03/15  3:55 AM  Result Value Ref Range   Glucose-Capillary 174 (H) 70 - 99 mg/dL  Protime-INR     Status: Abnormal   Collection Time: 01/03/15  4:15 AM  Result Value Ref Range   Prothrombin Time 25.0 (H) 11.6 - 15.2 seconds   INR 2.25 (H) 0.00 - 8.31  Basic metabolic panel     Status: Abnormal   Collection Time: 01/03/15  4:15 AM  Result Value Ref Range   Sodium 135 135 - 145 mmol/L   Potassium 3.6 3.5 - 5.1 mmol/L   Chloride 99 96 - 112 mmol/L   CO2 23 19 - 32 mmol/L   Glucose, Bld 204 (H) 70 - 99 mg/dL   BUN 46 (H) 6 - 23 mg/dL   Creatinine, Ser 2.16 (H) 0.50 - 1.35 mg/dL   Calcium 7.8 (L) 8.4 - 10.5 mg/dL   GFR calc non Af Amer 32 (L) >90 mL/min   GFR calc Af Amer 37 (L) >90 mL/min    Comment: (NOTE) The eGFR has been calculated using the CKD EPI equation. This calculation has not been validated in all clinical situations. eGFR's persistently <90 mL/min signify possible Chronic Kidney Disease.    Anion gap 13 5 - 15  CBC with Differential/Platelet     Status: Abnormal   Collection Time: 01/03/15  4:15 AM  Result Value Ref Range   WBC 18.9 (H) 4.0 - 10.5 K/uL   RBC 3.91 (L) 4.22 - 5.81 MIL/uL   Hemoglobin 8.5 (L) 13.0 - 17.0 g/dL   HCT 28.7 (L) 39.0 - 52.0 %   MCV 73.4 (L) 78.0 - 100.0 fL   MCH 21.7 (L) 26.0 - 34.0 pg   MCHC 29.6 (L) 30.0 - 36.0 g/dL   RDW 19.1 (H) 11.5 - 15.5 %   Platelets 409 (H) 150 - 400  K/uL   Neutrophils Relative % 87 (H) 43 - 77 %   Lymphocytes Relative 6 (L) 12 - 46 %   Monocytes Relative 7 3 - 12 %   Eosinophils Relative 0 0 - 5 %   Basophils Relative 0 0 - 1 %   Neutro Abs 16.5 (H) 1.7 - 7.7 K/uL   Lymphs Abs 1.1 0.7 - 4.0 K/uL    Monocytes Absolute 1.3 (H) 0.1 - 1.0 K/uL   Eosinophils Absolute 0.0 0.0 - 0.7 K/uL   Basophils Absolute 0.0 0.0 - 0.1 K/uL   Smear Review MORPHOLOGY UNREMARKABLE   Glucose, capillary     Status: Abnormal   Collection Time: 01/03/15  8:09 AM  Result Value Ref Range   Glucose-Capillary 184 (H) 70 - 99 mg/dL    Radiology/Results: Dg Chest 2 View  01/03/2015   CLINICAL DATA:  Gastric surgery, diabetes, shortness of breath 12/31/2014  EXAM: CHEST  2 VIEW  COMPARISON:  12/30/2014  FINDINGS: Bilateral perihilar interstitial thickening without focal consolidation. There a linear band of fibrosis in the lingula. No pleural effusion or pneumothorax. Stable cardiomediastinal silhouette.  The osseous structures are unremarkable.  IMPRESSION: Bilateral perihilar interstitial thickening, which may reflect atelectasis versus infection.   Electronically Signed   By: Kathreen Devoid   On: 01/03/2015 10:03    Anti-infectives: Anti-infectives    Start     Dose/Rate Route Frequency Ordered Stop   01/03/15 0600  vancomycin (VANCOCIN) 1,500 mg in sodium chloride 0.9 % 500 mL IVPB     1,500 mg 250 mL/hr over 120 Minutes Intravenous Every 12 hours 01/02/15 2147     01/02/15 1545  vancomycin (VANCOCIN) 2,500 mg in sodium chloride 0.9 % 500 mL IVPB     2,500 mg 250 mL/hr over 120 Minutes Intravenous  Once 01/02/15 1542 01/02/15 1821   12/31/14 1700  piperacillin-tazobactam (ZOSYN) IVPB 3.375 g     3.375 g 12.5 mL/hr over 240 Minutes Intravenous Every 8 hours 12/31/14 1536        Assessment/Plan: Problem List: Patient Active Problem List   Diagnosis Date Noted  . Panniculitis 03/22/2014    Priority: High  . Acute renal insufficiency 01/03/2015  . Esophageal reflux 01/02/2015  . Polyneuropathy in diabetes(357.2) 01/02/2015  . Abscess of postoperative wound of abdominal wall 01/02/2015  . Cellulitis and abscess of trunk 12/31/2014  . S/P IVC filter-temporary REMOVED   . Status post panniculectomy Oct  2015 07/17/2014  . Morbid obesity BMI 65 03/22/2014  . Pure hypercholesterolemia 03/22/2014  . Essential hypertension, benign 03/22/2014  . Diabetes 03/22/2014  . OSA (obstructive sleep apnea) 03/22/2014  . Hx pulmonary embolism 03/22/2014    New abscess on the right opened and drained.  Continue Vancomycin and Zosyn.   * No surgery found *    LOS: 3 days   Matt B. Hassell Done, MD, [facility] [phone] beeper [phone]  01/03/2015 2:01 PM

## 2015-01-03 NOTE — Progress Notes (Signed)
ANTIBIOTIC CONSULT NOTE - Follow up  Pharmacy Consult for Vancomycin Indication: wound infection   Recent Labs  01/03/15 1650  VANCOTROUGH 28.6*    Assessment: Vanc trough supratherapeutic after just two doses with SCr rising.  The next dose of Vanc was given after this blood was drawn.   Goal of Therapy:  Vancomycin trough level 15-20 mcg/ml  Plan:   Dc Vanc orders for now.   Check random vanc level in about 24hrs.  Follow up renal fxn, culture results, and clinical course.  Romeo Rabon, PharmD, pager [phone]. 01/03/2015,5:58 PM.

## 2015-01-03 NOTE — Progress Notes (Addendum)
Rt was called to setup 02 with home CPAP machine.  Pt was on a NRB when entering the room. Pt sats was 87% with 6LPM bleed in on CPAP. Rt placed pt on NRB, pt sats 100%. Rn calling MD at this time. Per chart pt sats has been fine.Pt is in no distress at this time.

## 2015-01-03 NOTE — Consult Note (Signed)
WOC spent greater than 1.5 hours with surgeon and then for placement of NPWT VAC dressings today. ID had seen patient earlier today and was concerned over area to the right lower abdomen. White foam filler placed to tunneling area on the right distal abdomen yesterday and added to NPWT suction with VAC machine and Yconnector to previous open areas.  Today I removed the dressings to all of the open areas (3 sites) at the time of my initial assessment.  New area of concern right lower abdomen along healed surgical incision.  Some serous crust over this area, induration and erythema, attention to this area per Dr. Hassell Done upon his arrival to the beside. See his notes. Assisted Dr. Hassell Done with I&D of this area.  Decision at that time to add additional NPWT machine to the right lower abdominal wounds x 2 and previous NPWT device to manage the wounds at the midline of the healed surgical incision.  Both continuous NPWT at 161mmHG.   WOC wound follow up Wound type: tunneled area at old surgical incision, new I&D site right lower abdomen along healed incision. Measurement: Right lower abdomen distal : 1.5cm x 1.0cmx 8cm  Right lower abdomen medial: (New) 6cm x 0.1cm x 10cm  Mid abdomen tunnel 1: medial 0.5cm x 0.5cm x 8cm; tunnel 2 lateral 1cm x 1cm x 10cm  Wound bed: unable to visualize wound base on the tunneled areas, new areas opened and drained by Dr. Hassell Done at the bedside is bloody in the base with some remaining clotted material but oozing so difficult to assess base at this time. Drainage (amount, consistency, odor) minimal serous drainage from the previous 3 tunneled sites that were on NPWT. Serosanguinous from new site. See Dr. Earlie Server note on the drainage at the time of the I&D, culture sent at that time.  Periwound: intact, large pannus still present and this area is somewhat indurated chronically per wife Dressing procedure/placement/frequency: 1 White foam placed the the most lateral right lower  abdominal wound, 2 black foam placed in the new site, skin between these two wounds protected with drape. Black foam bridge and seal at 161mmgh.  2 midline tunneled areas both filled with white foam, periwound tissue protected with drape, black foam bridge over this area and seal at 110mmHG.  Pt tolerated well.  WOC will follow along with you for management of NPWT VAC dressing changes. Tor Tsuda [place] RN,CWOCN [id number]

## 2015-01-03 NOTE — Progress Notes (Signed)
Pt had placed self on home CPAP machine with home mask. Pt had 6 LPM nasal cannula under nasal CPAP mask and preferred it at this time. Vitals stable. SPO2 92%. RT will continue to monitor.

## 2015-01-03 NOTE — Progress Notes (Signed)
Patient ID: Joseph Vandenheuvel., male   DOB: 05-29-1956, 59 y.o.   MRN: [id number]         [facility]    Date of Admission:  12/31/2014           Day 4 piperacillin tazobactam                    Day 2 vancomycin  Principal Problem:   Abscess of postoperative wound of abdominal wall Active Problems:   Status post panniculectomy Oct 2015   Morbid obesity BMI 65   Essential hypertension, benign   Diabetes   Panniculitis   OSA (obstructive sleep apnea)   Hx pulmonary embolism   S/P IVC filter-temporary REMOVED   Esophageal reflux   Polyneuropathy in diabetes(357.2)   Acute renal insufficiency   . amLODipine  5 mg Oral Daily  . docusate sodium  100 mg Oral BID  . FLUoxetine  40 mg Oral QHS  . furosemide  20 mg Oral Daily  . indapamide  5 mg Oral Daily  . insulin aspart  0-20 Units Subcutaneous 6 times per day  . insulin aspart  6 Units Subcutaneous TID WC  . insulin glargine  50 Units Subcutaneous QHS  . lisinopril  20 mg Oral Daily  . pantoprazole (PROTONIX) IV  40 mg Intravenous QHS  . pioglitazone  45 mg Oral Daily  . piperacillin-tazobactam (ZOSYN)  IV  3.375 g Intravenous Q8H  . vancomycin  1,500 mg Intravenous Q12H  . Warfarin - Pharmacist Dosing Inpatient   Does not apply q1800    Subjective:  He states "I had a rough night". He is complaining of very dry mouth and requesting a humidifier for his CPAP machine. His O2 sats dropped overnight and he was on a 100% nonrebreather mask. He denies any significant cough. He is not having any abdominal pain.  Review of Systems: Pertinent items are noted in HPI.  Past Medical History  Diagnosis Date  . Morbid obesity   . Pure hypercholesterolemia   . Essential hypertension, benign   . 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 Esophageal reflux   . Type II or unspecified type diabetes mellitus without mention of complication, not stated as  uncontrolled   . Anxiety state, unspecified   . Essential hypertension, benign   . Varicose veins of lower extremities with inflammation   . Peripheral vascular disease, unspecified   . Edema   . Type II or unspecified type diabetes mellitus with neurological manifestations, not stated as uncontrolled   . Polyneuropathy in diabetes(357.2)   . Personal history of PE (pulmonary embolism) 2004  . Depression   . Unspecified sleep apnea     uses CPAP  . Obstructive sleep apnea (adult) (pediatric)   . DVT (deep venous thrombosis) 2004  . Shortness of breath     DUE TO WEIGHT AND LOSS OF MOBILITY  . Myalgia and myositis, unspecified     BACK AND LEGS  . Falls frequently   . Poor venous access     PT STATES DIFFICULT TO DRAW BLOOD FROM HIS VEINS    History  Substance Use Topics  . Smoking status: Never Smoker   . Smokeless tobacco: Never Used  . Alcohol Use: No    History reviewed. No pertinent family history. No Known Allergies  OBJECTIVE: Blood pressure 130/42, pulse 92, temperature 99 F (37.2 C), temperature source Oral, resp. rate  18, height 6\' 1"  (1.854 m), weight 436 lb 4.7 oz (197.9 kg), SpO2 90 %.  I and O 3.6 L positive since admission  General:  He is very weak and tired Skin:  No rash Lungs:  Clear anteriorly Cor:  Distant but regular S1 and S2 with no murmurs Abdomen:  Obese. Soft and nontender. It appears there is minimal output from both vac drains. His only 50 mL in the drain canister for the past 3 days. Extremities: 1+ pitting edema  Lab Results Lab Results  Component Value Date   WBC 18.9* 01/03/2015   HGB 8.5* 01/03/2015   HCT 28.7* 01/03/2015   MCV 73.4* 01/03/2015   PLT 409* 01/03/2015    Lab Results  Component Value Date   CREATININE 2.16* 01/03/2015   BUN 46* 01/03/2015   NA 135 01/03/2015   K 3.6 01/03/2015   CL 99 01/03/2015   CO2 23 01/03/2015    Lab Results  Component Value Date   ALT 17 07/05/2008   AST 16 07/05/2008   ALKPHOS 75  07/05/2008   BILITOT 0.7 07/05/2008     Microbiology: Recent Results (from the past 240 hour(s))  Blood culture (routine x 2)     Status: None (Preliminary result)   Collection Time: 12/30/14  8:17 PM  Result Value Ref Range Status   Specimen Description BLOOD LEFT HAND  Final   Special Requests BOTTLES DRAWN AEROBIC ONLY 3 CC  Final   Culture   Final           BLOOD CULTURE RECEIVED NO GROWTH TO DATE CULTURE WILL BE HELD FOR 5 DAYS BEFORE ISSUING A FINAL NEGATIVE REPORT Performed at [facility]    Report Status PENDING  Incomplete  Blood culture (routine x 2)     Status: None (Preliminary result)   Collection Time: 12/30/14  8:33 PM  Result Value Ref Range Status   Specimen Description BLOOD LEFT FOREARM  Final   Special Requests BOTTLES DRAWN AEROBIC AND ANAEROBIC 4CC  Final   Culture   Final           BLOOD CULTURE RECEIVED NO GROWTH TO DATE CULTURE WILL BE HELD FOR 5 DAYS BEFORE ISSUING A FINAL NEGATIVE REPORT Performed at [facility]    Report Status PENDING  Incomplete   CHEST 2 VIEW 01/03/2015  COMPARISON: 12/30/2014  FINDINGS: Bilateral perihilar interstitial thickening without focal consolidation. There a linear band of fibrosis in the lingula. No pleural effusion or pneumothorax. Stable cardiomediastinal silhouette.  The osseous structures are unremarkable.  IMPRESSION: Bilateral perihilar interstitial thickening, which may reflect atelectasis versus infection.  Electronically Signed By: Kathreen Devoid On: 01/03/2015 10:03  CT abdomen and pelvis 12/30/2014  IMPRESSION: Gas and fluid collection within the subcutaneous fat of the right lower anterior abdominal wall associated with infiltration and skin defect consistent with cellulitis, abscess and fistula, likely associated with postoperative site. Infiltration at in the mesentery with mild prominence of mesenteric lymph nodes, likely reactive or inflammatory. No bowel  obstruction.   Electronically Signed  By: Lucienne Capers M.D.  On: 12/30/2014 22:37  Assessment: He continues to have fever and increasing leukocytosis, most likely due to the incompletely drained abscess in the right lower quadrant. Dawn Engel's note yesterday indicated that there is a "narrow opening" to the deeper sinus tract communicating with the abdominal wall abscess. I will try to discuss alternative drainage options with Dr. Hassell Done.  His chest x-ray shows some new air in the hilar markings.  I suspect that this is related to atelectasis but he may have some early volume overload and certainly is at relatively high risk for pneumonia.  Plan: 1. Continue vancomycin and piperacillin tazobactam for now 2. Discuss alternative options for abscess drainage with Dr. Hassell Done 3. Dr. Lita Mains [phone]) will follow with you this weekend   Michel Bickers, MD [facility] [phone] pager   [phone] cell 01/03/2015, 10:54 AM

## 2015-01-04 DIAGNOSIS — B9689 Other specified bacterial agents as the cause of diseases classified elsewhere: Secondary | ICD-10-CM

## 2015-01-04 DIAGNOSIS — L02211 Cutaneous abscess of abdominal wall: Secondary | ICD-10-CM

## 2015-01-04 DIAGNOSIS — Z9049 Acquired absence of other specified parts of digestive tract: Secondary | ICD-10-CM

## 2015-01-04 DIAGNOSIS — T814XXA Infection following a procedure, initial encounter: Principal | ICD-10-CM

## 2015-01-04 DIAGNOSIS — Z6841 Body Mass Index (BMI) 40.0 and over, adult: Secondary | ICD-10-CM

## 2015-01-04 DIAGNOSIS — R195 Other fecal abnormalities: Secondary | ICD-10-CM

## 2015-01-04 LAB — GLUCOSE, CAPILLARY
Glucose-Capillary: 115 mg/dL — ABNORMAL HIGH (ref 70–99)
Glucose-Capillary: 130 mg/dL — ABNORMAL HIGH (ref 70–99)
Glucose-Capillary: 142 mg/dL — ABNORMAL HIGH (ref 70–99)
Glucose-Capillary: 146 mg/dL — ABNORMAL HIGH (ref 70–99)
Glucose-Capillary: 153 mg/dL — ABNORMAL HIGH (ref 70–99)
Glucose-Capillary: 161 mg/dL — ABNORMAL HIGH (ref 70–99)

## 2015-01-04 LAB — CBC WITH DIFFERENTIAL/PLATELET
Basophils Absolute: 0 10*3/uL (ref 0.0–0.1)
Basophils Relative: 0 % (ref 0–1)
Eosinophils Absolute: 0.3 10*3/uL (ref 0.0–0.7)
Eosinophils Relative: 2 % (ref 0–5)
HCT: 25.5 % — ABNORMAL LOW (ref 39.0–52.0)
Hemoglobin: 7.7 g/dL — ABNORMAL LOW (ref 13.0–17.0)
Lymphocytes Relative: 9 % — ABNORMAL LOW (ref 12–46)
Lymphs Abs: 1.2 10*3/uL (ref 0.7–4.0)
MCH: 21.8 pg — ABNORMAL LOW (ref 26.0–34.0)
MCHC: 30.2 g/dL (ref 30.0–36.0)
MCV: 72.2 fL — ABNORMAL LOW (ref 78.0–100.0)
Monocytes Absolute: 0.8 10*3/uL (ref 0.1–1.0)
Monocytes Relative: 6 % (ref 3–12)
Neutro Abs: 11.1 10*3/uL — ABNORMAL HIGH (ref 1.7–7.7)
Neutrophils Relative %: 83 % — ABNORMAL HIGH (ref 43–77)
Platelets: 424 10*3/uL — ABNORMAL HIGH (ref 150–400)
RBC: 3.53 MIL/uL — ABNORMAL LOW (ref 4.22–5.81)
RDW: 19 % — ABNORMAL HIGH (ref 11.5–15.5)
WBC: 13.4 10*3/uL — ABNORMAL HIGH (ref 4.0–10.5)

## 2015-01-04 LAB — BASIC METABOLIC PANEL
ANION GAP: 13 (ref 5–15)
BUN: 46 mg/dL — ABNORMAL HIGH (ref 6–23)
CALCIUM: 8.1 mg/dL — AB (ref 8.4–10.5)
CHLORIDE: 102 mmol/L (ref 96–112)
CO2: 22 mmol/L (ref 19–32)
CREATININE: 1.87 mg/dL — AB (ref 0.50–1.35)
GFR calc Af Amer: 44 mL/min — ABNORMAL LOW (ref >90)
GFR, EST NON AFRICAN AMERICAN: 38 mL/min — AB (ref >90)
GLUCOSE: 129 mg/dL — AB (ref 70–99)
Potassium: 3.3 mmol/L — ABNORMAL LOW (ref 3.5–5.1)
Sodium: 137 mmol/L (ref 135–145)

## 2015-01-04 LAB — VANCOMYCIN, RANDOM: VANCOMYCIN RM: 15.3 ug/mL

## 2015-01-04 LAB — CLOSTRIDIUM DIFFICILE BY PCR: CDIFFPCR: NEGATIVE

## 2015-01-04 LAB — PROTIME-INR
INR: 2.97 — ABNORMAL HIGH (ref 0.00–1.49)
Prothrombin Time: 31.1 seconds — ABNORMAL HIGH (ref 11.6–15.2)

## 2015-01-04 MED ORDER — SODIUM CHLORIDE 0.9 % IV SOLN
1500.0000 mg | Freq: Once | INTRAVENOUS | Status: AC
  Administered 2015-01-04: 1500 mg via INTRAVENOUS
  Filled 2015-01-04: qty 1500

## 2015-01-04 MED ORDER — WARFARIN SODIUM 2 MG PO TABS
2.0000 mg | ORAL_TABLET | Freq: Once | ORAL | Status: AC
  Administered 2015-01-04: 2 mg via ORAL
  Filled 2015-01-04: qty 1

## 2015-01-04 NOTE — Progress Notes (Addendum)
ANTIBIOTIC CONSULT NOTE  Pharmacy Consult for Vancomycin Indication: wound infection  No Known Allergies  Patient Measurements: Height: 6\' 1"  (185.4 cm) Weight: (!) 436 lb 4.7 oz (197.9 kg) IBW/kg (Calculated) : 79.9  Vital Signs: Temp: 98.6 F (37 C) (04/09 0543) Temp Source: Oral (04/09 0543) BP: 143/44 mmHg (04/09 0543) Pulse Rate: 90 (04/09 0543) Intake/Output from previous day: 04/08 0701 - 04/09 0700 In: 3170 [P.O.:120; I.V.:2400; IV Piggyback:650] Out: 950 [Urine:900; Drains:50] Intake/Output from this shift:    Labs:  Recent Labs  01/02/15 0502 01/03/15 0415 01/04/15 0534  WBC 17.2* 18.9* 13.4*  HGB 7.9* 8.5* 7.7*  PLT 381 409* 424*  CREATININE 1.76* 2.16*  --    Estimated Creatinine Clearance: 67.8 mL/min (by C-G formula based on Cr of 2.16).  Recent Labs  01/03/15 1650  VANCOTROUGH 28.6*     Microbiology: Recent Results (from the past 720 hour(s))  Blood culture (routine x 2)     Status: None (Preliminary result)   Collection Time: 12/30/14  8:17 PM  Result Value Ref Range Status   Specimen Description BLOOD LEFT HAND  Final   Special Requests BOTTLES DRAWN AEROBIC ONLY 3 CC  Final   Culture   Final           BLOOD CULTURE RECEIVED NO GROWTH TO DATE CULTURE WILL BE HELD FOR 5 DAYS BEFORE ISSUING A FINAL NEGATIVE REPORT Performed at [facility]    Report Status PENDING  Incomplete  Blood culture (routine x 2)     Status: None (Preliminary result)   Collection Time: 12/30/14  8:33 PM  Result Value Ref Range Status   Specimen Description BLOOD LEFT FOREARM  Final   Special Requests BOTTLES DRAWN AEROBIC AND ANAEROBIC 4CC  Final   Culture   Final           BLOOD CULTURE RECEIVED NO GROWTH TO DATE CULTURE WILL BE HELD FOR 5 DAYS BEFORE ISSUING A FINAL NEGATIVE REPORT Performed at [facility]    Report Status PENDING  Incomplete   Medical History: Past Medical History  Diagnosis Date  . Morbid obesity   . Pure  hypercholesterolemia   . Essential hypertension, benign   . 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 Esophageal reflux   . Type II or unspecified type diabetes mellitus without mention of complication, not stated as uncontrolled   . Anxiety state, unspecified   . Essential hypertension, benign   . Varicose veins of lower extremities with inflammation   . Peripheral vascular disease, unspecified   . Edema   . Type II or unspecified type diabetes mellitus with neurological manifestations, not stated as uncontrolled   . Polyneuropathy in diabetes(357.2)   . Personal history of PE (pulmonary embolism) 2004  . Depression   . Unspecified sleep apnea     uses CPAP  . Obstructive sleep apnea (adult) (pediatric)   . DVT (deep venous thrombosis) 2004  . Shortness of breath     DUE TO WEIGHT AND LOSS OF MOBILITY  . Myalgia and myositis, unspecified     BACK AND LEGS  . Falls frequently   . Poor venous access     PT STATES DIFFICULT TO DRAW BLOOD FROM HIS VEINS   Medications:  Scheduled:  . amLODipine  5 mg Oral Daily  . docusate sodium  100 mg Oral BID  . FLUoxetine  40 mg Oral QHS  . furosemide  20 mg Oral  Daily  . indapamide  5 mg Oral Daily  . insulin aspart  0-20 Units Subcutaneous 6 times per day  . insulin aspart  6 Units Subcutaneous TID WC  . insulin glargine  50 Units Subcutaneous QHS  . lisinopril  20 mg Oral Daily  . pantoprazole (PROTONIX) IV  40 mg Intravenous QHS  . pioglitazone  45 mg Oral Daily  . piperacillin-tazobactam (ZOSYN)  IV  3.375 g Intravenous Q8H  . Warfarin - Pharmacist Dosing Inpatient   Does not apply q1800   Anti-infectives    Start     Dose/Rate Route Frequency Ordered Stop   01/03/15 0600  vancomycin (VANCOCIN) 1,500 mg in sodium chloride 0.9 % 500 mL IVPB  Status:  Discontinued     1,500 mg 250 mL/hr over 120 Minutes Intravenous Every 12 hours 01/02/15 2147 01/03/15 1755   01/02/15 1545   vancomycin (VANCOCIN) 2,500 mg in sodium chloride 0.9 % 500 mL IVPB     2,500 mg 250 mL/hr over 120 Minutes Intravenous  Once 01/02/15 1542 01/02/15 1821   12/31/14 1700  piperacillin-tazobactam (ZOSYN) IVPB 3.375 g     3.375 g 12.5 mL/hr over 240 Minutes Intravenous Every 8 hours 12/31/14 1536       Assessment: 52 yoM s/p panniculectomy 10/15, difficulty with wound healing, placed wound VAC 1/16, admit 4/5 for decreased VAC output, abscess and fistula noted on CT. 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   Admit 4/5 started on Zosyn, add Vancomycin per ID MD, pharmacy to dose  SCr increasing since admit  4/8 drainage of infected hematoma in RLQ w/ VAC.  MD thinks this is source of leukocytosis and fever  4/5 >> Zosyn >>  4/8 >> vancomycin >>  Tmax: afeb since 4/8 WBCs: peaked at 19, now trending down Renal: no labs today, last SCr jumped from 1.76 > 2.16  4/8 wound Cx: IP  Drug level / dose changes info: 4/8 1700 VT = 28.6 after 2.5g load and first dose of 1.5g q12h, not at Css yet but drawn because SCr was rising. Dose given after blood drawn.    Goal of Therapy:   Vancomycin trough level 15-20 mcg/ml   Eradication of infection  Appropriate antibiotic dosing for indication and renal function  Plan:  Day 5 total antibiotics, Day 2 vancomycin  Zosyn dosing appropriate for renal function  Continue to hold vancomycin pending random level tonight; since no SCr this morning, will add BMP to aid in interpreting level  Follow clinical course, renal function, culture results as available  Follow for de-escalation of antibiotics and LOT   Reuel Boom, PharmD Pager: [phone] 01/04/2015, 8:27 AM

## 2015-01-04 NOTE — Progress Notes (Signed)
  Subjective: Pt feeling much better today.    Objective: Vital signs in last 24 hours: Temp:  [98.4 F (36.9 C)-98.6 F (37 C)] 98.6 F (37 C) (04/09 0543) Pulse Rate:  [89-91] 90 (04/09 0543) Resp:  [18] 18 (04/09 0543) BP: (113-143)/(40-44) 143/44 mmHg (04/09 0543) SpO2:  [90 %-91 %] 90 % (04/09 0543) Last BM Date: 01/03/15  Intake/Output from previous day: 04/08 0701 - 04/09 0700 In: 3170 [P.O.:120; I.V.:2400; IV Piggyback:650] Out: 950 [Urine:900; Drains:50] Intake/Output this shift:    General appearance: alert and cooperative GI: vac in place in abd wall  Lab Results:   Recent Labs  01/03/15 0415 01/04/15 0534  WBC 18.9* 13.4*  HGB 8.5* 7.7*  HCT 28.7* 25.5*  PLT 409* 424*   BMET  Recent Labs  01/02/15 0502 01/03/15 0415  NA 135 135  K 3.6 3.6  CL 100 99  CO2 23 23  GLUCOSE 227* 204*  BUN 38* 46*  CREATININE 1.76* 2.16*  CALCIUM 7.6* 7.8*   PT/INR  Recent Labs  01/03/15 0415 01/04/15 0534  LABPROT 25.0* 31.1*  INR 2.25* 2.97*    Studies/Results: Dg Chest 2 View  01/03/2015   CLINICAL DATA:  Gastric surgery, diabetes, shortness of breath 12/31/2014  EXAM: CHEST  2 VIEW  COMPARISON:  12/30/2014  FINDINGS: Bilateral perihilar interstitial thickening without focal consolidation. There a linear band of fibrosis in the lingula. No pleural effusion or pneumothorax. Stable cardiomediastinal silhouette.  The osseous structures are unremarkable.  IMPRESSION: Bilateral perihilar interstitial thickening, which may reflect atelectasis versus infection.   Electronically Signed   By: Kathreen Devoid   On: 01/03/2015 10:03    Anti-infectives: Anti-infectives    Start     Dose/Rate Route Frequency Ordered Stop   01/03/15 0600  vancomycin (VANCOCIN) 1,500 mg in sodium chloride 0.9 % 500 mL IVPB  Status:  Discontinued     1,500 mg 250 mL/hr over 120 Minutes Intravenous Every 12 hours 01/02/15 2147 01/03/15 1755   01/02/15 1545  vancomycin (VANCOCIN) 2,500 mg  in sodium chloride 0.9 % 500 mL IVPB     2,500 mg 250 mL/hr over 120 Minutes Intravenous  Once 01/02/15 1542 01/02/15 1821   12/31/14 1700  piperacillin-tazobactam (ZOSYN) IVPB 3.375 g     3.375 g 12.5 mL/hr over 240 Minutes Intravenous Every 8 hours 12/31/14 1536        Assessment/Plan: Panniculitis Con't abx as per ID-WBC trending down Vac in place Mobilize   LOS: 4 days    Rosario Jacks., [facility] 01/04/2015

## 2015-01-04 NOTE — Progress Notes (Signed)
INFECTIOUS DISEASE PROGRESS NOTE  ID: Joseph [name]. is a 59 y.o. male with  Principal Problem:   Abscess of postoperative wound of abdominal wall Active Problems:   Morbid obesity BMI 65   Essential hypertension, benign   Diabetes   Panniculitis   OSA (obstructive sleep apnea)   Hx pulmonary embolism   Status post panniculectomy Oct 2015   S/P IVC filter-temporary REMOVED   Esophageal reflux   Polyneuropathy in diabetes(357.2)   Acute renal insufficiency  Subjective: Without complaints. 3 loose BM this AM.   Abtx:  Anti-infectives    Start     Dose/Rate Route Frequency Ordered Stop   01/03/15 0600  vancomycin (VANCOCIN) 1,500 mg in sodium chloride 0.9 % 500 mL IVPB  Status:  Discontinued     1,500 mg 250 mL/hr over 120 Minutes Intravenous Every 12 hours 01/02/15 2147 01/03/15 1755   01/02/15 1545  vancomycin (VANCOCIN) 2,500 mg in sodium chloride 0.9 % 500 mL IVPB     2,500 mg 250 mL/hr over 120 Minutes Intravenous  Once 01/02/15 1542 01/02/15 1821   12/31/14 1700  piperacillin-tazobactam (ZOSYN) IVPB 3.375 g     3.375 g 12.5 mL/hr over 240 Minutes Intravenous Every 8 hours 12/31/14 1536        Medications:  Scheduled: . amLODipine  5 mg Oral Daily  . docusate sodium  100 mg Oral BID  . FLUoxetine  40 mg Oral QHS  . furosemide  20 mg Oral Daily  . indapamide  5 mg Oral Daily  . insulin aspart  0-20 Units Subcutaneous 6 times per day  . insulin aspart  6 Units Subcutaneous TID WC  . insulin glargine  50 Units Subcutaneous QHS  . lisinopril  20 mg Oral Daily  . pantoprazole (PROTONIX) IV  40 mg Intravenous QHS  . pioglitazone  45 mg Oral Daily  . piperacillin-tazobactam (ZOSYN)  IV  3.375 g Intravenous Q8H  . warfarin  2 mg Oral ONCE-1800  . Warfarin - Pharmacist Dosing Inpatient   Does not apply q1800    Objective: Vital signs in last 24 hours: Temp:  [97.6 F (36.4 C)-98.6 F (37 C)] 98.2 F (36.8 C) (04/09 1310) Pulse Rate:  [79-91] 79 (04/09  1310) Resp:  [18] 18 (04/09 1310) BP: (113-143)/(40-47) 123/41 mmHg (04/09 1310) SpO2:  [86 %-94 %] 94 % (04/09 1310)   General appearance: alert, cooperative and no distress GI: RLQ wund has drain in place, induration medially. midline with drain.   Lab Results  Recent Labs  01/02/15 0502 01/03/15 0415 01/04/15 0534  WBC 17.2* 18.9* 13.4*  HGB 7.9* 8.5* 7.7*  HCT 26.6* 28.7* 25.5*  NA 135 135  --   K 3.6 3.6  --   CL 100 99  --   CO2 23 23  --   BUN 38* 46*  --   CREATININE 1.76* 2.16*  --    Liver Panel No results for input(s): PROT, ALBUMIN, AST, ALT, ALKPHOS, BILITOT, BILIDIR, IBILI in the last 72 hours. Sedimentation Rate No results for input(s): ESRSEDRATE in the last 72 hours. C-Reactive Protein No results for input(s): CRP in the last 72 hours.  Microbiology: Recent Results (from the past 240 hour(s))  Blood culture (routine x 2)     Status: None (Preliminary result)   Collection Time: 12/30/14  8:17 PM  Result Value Ref Range Status   Specimen Description BLOOD LEFT HAND  Final   Special Requests BOTTLES DRAWN AEROBIC ONLY 3  CC  Final   Culture   Final           BLOOD CULTURE RECEIVED NO GROWTH TO DATE CULTURE WILL BE HELD FOR 5 DAYS BEFORE ISSUING A FINAL NEGATIVE REPORT Performed at [facility]    Report Status PENDING  Incomplete  Blood culture (routine x 2)     Status: None (Preliminary result)   Collection Time: 12/30/14  8:33 PM  Result Value Ref Range Status   Specimen Description BLOOD LEFT FOREARM  Final   Special Requests BOTTLES DRAWN AEROBIC AND ANAEROBIC 4CC  Final   Culture   Final           BLOOD CULTURE RECEIVED NO GROWTH TO DATE CULTURE WILL BE HELD FOR 5 DAYS BEFORE ISSUING A FINAL NEGATIVE REPORT Performed at [facility]    Report Status PENDING  Incomplete  Wound culture     Status: None (Preliminary result)   Collection Time: 01/03/15  1:47 PM  Result Value Ref Range Status   Specimen Description ABDOMEN  Final    Special Requests Normal  Final   Gram Stain   Final    ABUNDANT WBC PRESENT,BOTH PMN AND MONONUCLEAR NO SQUAMOUS EPITHELIAL CELLS SEEN NO ORGANISMS SEEN Performed at [facility]    Culture   Final    NO GROWTH 1 DAY Performed at [facility]    Report Status PENDING  Incomplete    Studies/Results: Dg Chest 2 View  01/03/2015   CLINICAL DATA:  Gastric surgery, diabetes, shortness of breath 12/31/2014  EXAM: CHEST  2 VIEW  COMPARISON:  12/30/2014  FINDINGS: Bilateral perihilar interstitial thickening without focal consolidation. There a linear band of fibrosis in the lingula. No pleural effusion or pneumothorax. Stable cardiomediastinal silhouette.  The osseous structures are unremarkable.  IMPRESSION: Bilateral perihilar interstitial thickening, which may reflect atelectasis versus infection.   Electronically Signed   By: Kathreen Devoid   On: 01/03/2015 10:03     Assessment/Plan: Morbid Obesity OSA Panniculiectomy Abscess of abd wall Loose BM   Total days of antibiotics: 5 zosyn   He appears to be doing well. WBC had decreased, he has defervesced.  Await his Cx. await C diff. No change in anbx for now        Bobby Rumpf Infectious Diseases (pager) [phone] [email or web address] 01/04/2015, 2:21 PM  LOS: 4 days

## 2015-01-04 NOTE — Progress Notes (Signed)
Pt placed on CPAP with 2 LPM O2 bleed in via Pt home nasal mask.  Machine settings are unknown at this time due to machine being pt's from home.  RT to monitor and assess as needed.

## 2015-01-04 NOTE — Progress Notes (Signed)
ANTICOAGULATION CONSULT NOTE  Pharmacy Consult for coumadin Indication: hx PE/DVT  No Known Allergies  Patient Measurements: Height: 6\' 1"  (185.4 cm) Weight: (!) 436 lb 4.7 oz (197.9 kg) IBW/kg (Calculated) : 79.9  Vital Signs: Temp: 98.6 F (37 C) (04/09 0543) Temp Source: Oral (04/09 0543) BP: 143/44 mmHg (04/09 0543) Pulse Rate: 90 (04/09 0543)  Labs:  Recent Labs  01/02/15 0502 01/03/15 0415 01/04/15 0534  HGB 7.9* 8.5* 7.7*  HCT 26.6* 28.7* 25.5*  PLT 381 409* 424*  LABPROT 24.2* 25.0* 31.1*  INR 2.16* 2.25* 2.97*  CREATININE 1.76* 2.16*  --     Estimated Creatinine Clearance: 67.8 mL/min (by C-G formula based on Cr of 2.16).   Medical History: Past Medical History  Diagnosis Date  . Morbid obesity   . Pure hypercholesterolemia   . Essential hypertension, benign   . 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 Esophageal reflux   . Type II or unspecified type diabetes mellitus without mention of complication, not stated as uncontrolled   . Anxiety state, unspecified   . Essential hypertension, benign   . Varicose veins of lower extremities with inflammation   . Peripheral vascular disease, unspecified   . Edema   . Type II or unspecified type diabetes mellitus with neurological manifestations, not stated as uncontrolled   . Polyneuropathy in diabetes(357.2)   . Personal history of PE (pulmonary embolism) 2004  . Depression   . Unspecified sleep apnea     uses CPAP  . Obstructive sleep apnea (adult) (pediatric)   . DVT (deep venous thrombosis) 2004  . Shortness of breath     DUE TO WEIGHT AND LOSS OF MOBILITY  . Myalgia and myositis, unspecified     BACK AND LEGS  . Falls frequently   . Poor venous access     PT STATES DIFFICULT TO DRAW BLOOD FROM HIS VEINS   Assessment: Patient is a 59 y.o m on coumadin PTA for hx PE/DVT.  He presented to [facility] with panniculitis and currently on abx for infection.  Pharmacy is consulted to manage coumadin therapy for patient.  Home dose: 5mg  daily except 7.5mg  on Mondays.   Today, 01/03/15:   INR 2.97, up sharply from 2.25 yesterday  hgb low but stable, plt ok  Drained infected hematoma yesterday, about 150 ml mostly sanguinous fluid in vac.  MD aware, following.  No significant drug-drug interx  Consumed 0% of diet yesterday (previously 100%)  No SCr today, last was 2.16 on 4/8  Goal of Therapy:  INR 2-3    Plan:   Warfarin 2 mg PO x 1 tonight at 18:00.  Unsure of the significance of today's much higher INR.  Can hold tomorrow's dose if continues over 3.0  Daily INR; CBC at least q72 hr while on warfarin  Monitor for signs of bleeding  Reuel Boom, PharmD Pager: [phone] 01/04/2015, 9:17 AM

## 2015-01-04 NOTE — Progress Notes (Signed)
ANTIBIOTIC CONSULT NOTE  Pharmacy Consult for Vancomycin Indication: wound infection  No Known Allergies  Patient Measurements: Height: 6\' 1"  (185.4 cm) Weight: (!) 436 lb 4.7 oz (197.9 kg) IBW/kg (Calculated) : 79.9  Vital Signs: Temp: 97.8 F (36.6 C) (04/09 1759) Temp Source: Oral (04/09 1759) BP: 150/45 mmHg (04/09 1759) Pulse Rate: 80 (04/09 1759) Intake/Output from previous day: 04/08 0701 - 04/09 0700 In: 3170 [P.O.:120; I.V.:2400; IV Piggyback:650] Out: 950 [Urine:900; Drains:50] Intake/Output from this shift: Total I/O In: 480 [P.O.:480] Out: 500 [Urine:500]  Labs:  Recent Labs  01/02/15 0502 01/03/15 0415 01/04/15 0534 01/04/15 1714  WBC 17.2* 18.9* 13.4*  --   HGB 7.9* 8.5* 7.7*  --   PLT 381 409* 424*  --   CREATININE 1.76* 2.16*  --  1.87*   Estimated Creatinine Clearance: 78.4 mL/min (by C-G formula based on Cr of 1.87).  Recent Labs  01/03/15 1650 01/04/15 1714  VANCOTROUGH 28.6*  --   VANCORANDOM  --  15.3     Microbiology: Recent Results (from the past 720 hour(s))  Blood culture (routine x 2)     Status: None (Preliminary result)   Collection Time: 12/30/14  8:17 PM  Result Value Ref Range Status   Specimen Description BLOOD LEFT HAND  Final   Special Requests BOTTLES DRAWN AEROBIC ONLY 3 CC  Final   Culture   Final           BLOOD CULTURE RECEIVED NO GROWTH TO DATE CULTURE WILL BE HELD FOR 5 DAYS BEFORE ISSUING A FINAL NEGATIVE REPORT Performed at [facility]    Report Status PENDING  Incomplete  Blood culture (routine x 2)     Status: None (Preliminary result)   Collection Time: 12/30/14  8:33 PM  Result Value Ref Range Status   Specimen Description BLOOD LEFT FOREARM  Final   Special Requests BOTTLES DRAWN AEROBIC AND ANAEROBIC 4CC  Final   Culture   Final           BLOOD CULTURE RECEIVED NO GROWTH TO DATE CULTURE WILL BE HELD FOR 5 DAYS BEFORE ISSUING A FINAL NEGATIVE REPORT Performed at [facility]     Report Status PENDING  Incomplete  Wound culture     Status: None (Preliminary result)   Collection Time: 01/03/15  1:47 PM  Result Value Ref Range Status   Specimen Description ABDOMEN  Final   Special Requests Normal  Final   Gram Stain   Final    ABUNDANT WBC PRESENT,BOTH PMN AND MONONUCLEAR NO SQUAMOUS EPITHELIAL CELLS SEEN NO ORGANISMS SEEN Performed at [facility]    Culture   Final    NO GROWTH 1 DAY Performed at [facility]    Report Status PENDING  Incomplete  Clostridium Difficile by PCR     Status: None   Collection Time: 01/04/15 12:15 PM  Result Value Ref Range Status   C difficile by pcr NEGATIVE NEGATIVE Final   Medical History: Past Medical History  Diagnosis Date  . Morbid obesity   . Pure hypercholesterolemia   . Essential hypertension, benign   . 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 Esophageal reflux   . Type II or unspecified type diabetes mellitus without mention of complication, not stated as uncontrolled   . Anxiety state, unspecified   . Essential hypertension, benign   . Varicose veins of lower extremities with inflammation   . Peripheral vascular disease,  unspecified   . Edema   . Type II or unspecified type diabetes mellitus with neurological manifestations, not stated as uncontrolled   . Polyneuropathy in diabetes(357.2)   . Personal history of PE (pulmonary embolism) 2004  . Depression   . Unspecified sleep apnea     uses CPAP  . Obstructive sleep apnea (adult) (pediatric)   . DVT (deep venous thrombosis) 2004  . Shortness of breath     DUE TO WEIGHT AND LOSS OF MOBILITY  . Myalgia and myositis, unspecified     BACK AND LEGS  . Falls frequently   . Poor venous access     PT STATES DIFFICULT TO DRAW BLOOD FROM HIS VEINS   Medications:  Scheduled:  . amLODipine  5 mg Oral Daily  . docusate sodium  100 mg Oral BID  . FLUoxetine  40 mg Oral QHS  . furosemide   20 mg Oral Daily  . indapamide  5 mg Oral Daily  . insulin aspart  0-20 Units Subcutaneous 6 times per day  . insulin aspart  6 Units Subcutaneous TID WC  . insulin glargine  50 Units Subcutaneous QHS  . lisinopril  20 mg Oral Daily  . pantoprazole (PROTONIX) IV  40 mg Intravenous QHS  . pioglitazone  45 mg Oral Daily  . piperacillin-tazobactam (ZOSYN)  IV  3.375 g Intravenous Q8H  . warfarin  2 mg Oral ONCE-1800  . Warfarin - Pharmacist Dosing Inpatient   Does not apply q1800   Anti-infectives    Start     Dose/Rate Route Frequency Ordered Stop   01/03/15 0600  vancomycin (VANCOCIN) 1,500 mg in sodium chloride 0.9 % 500 mL IVPB  Status:  Discontinued     1,500 mg 250 mL/hr over 120 Minutes Intravenous Every 12 hours 01/02/15 2147 01/03/15 1755   01/02/15 1545  vancomycin (VANCOCIN) 2,500 mg in sodium chloride 0.9 % 500 mL IVPB     2,500 mg 250 mL/hr over 120 Minutes Intravenous  Once 01/02/15 1542 01/02/15 1821   12/31/14 1700  piperacillin-tazobactam (ZOSYN) IVPB 3.375 g     3.375 g 12.5 mL/hr over 240 Minutes Intravenous Every 8 hours 12/31/14 1536       Assessment: 51 yoM s/p panniculectomy 10/15, difficulty with wound healing, placed wound VAC 1/16, admit 4/5 for decreased VAC output, abscess and fistula noted on CT. 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   Admit 4/5 started on Zosyn, add Vancomycin per ID MD, pharmacy to dose  SCr increasing since admit  4/8 drainage of infected hematoma in RLQ w/ VAC.  MD thinks this is source of leukocytosis and fever  4/5 >> Zosyn >>  4/8 >> vancomycin >>  Tmax: afeb since 4/8 WBCs: peaked at 19, now trending down Renal: no labs today, last SCr jumped from 1.76 > 2.16  4/8 wound Cx: IP  Drug level / dose changes info: 4/8 1700 VT = 28.6 after 2.5g load and first dose of 1.5g q12h, not at Css yet but drawn because SCr was rising. Dose given after blood drawn.    Goal of Therapy:   Vancomycin trough level 15-20 mcg/ml    Eradication of infection  Appropriate antibiotic dosing for indication and renal function  Plan:  Day 5 total antibiotics, Day 2 vancomycin  Zosyn dosing appropriate for renal function  Continue to hold vancomycin pending random level tonight; since no SCr this morning, will add BMP to aid in interpreting level  Follow clinical course, renal function,  culture results as available  Follow for de-escalation of antibiotics and LOT   Reuel Boom, PharmD Pager: [phone] 01/04/2015, 6:02 PM   Addendum: Vancomycin random level = 15.3 mcg/ml (~24hr after last dose) which is therapeutic SCr improved to 1.87  Plan:  Give vancomycin 1500mg  IV x 1 tonight  Consider rechecking vancomycin level in 24hrs to assess clearance  Continue to monitor renal function closely  Peggyann Juba, PharmD, BCPS 01/04/2015 6:04 PM

## 2015-01-05 LAB — PROTIME-INR
INR: 3.07 — ABNORMAL HIGH (ref 0.00–1.49)
Prothrombin Time: 31.9 seconds — ABNORMAL HIGH (ref 11.6–15.2)

## 2015-01-05 LAB — GLUCOSE, CAPILLARY
GLUCOSE-CAPILLARY: 195 mg/dL — AB (ref 70–99)
GLUCOSE-CAPILLARY: 200 mg/dL — AB (ref 70–99)
Glucose-Capillary: 101 mg/dL — ABNORMAL HIGH (ref 70–99)
Glucose-Capillary: 125 mg/dL — ABNORMAL HIGH (ref 70–99)
Glucose-Capillary: 167 mg/dL — ABNORMAL HIGH (ref 70–99)
Glucose-Capillary: 201 mg/dL — ABNORMAL HIGH (ref 70–99)
Glucose-Capillary: 96 mg/dL (ref 70–99)

## 2015-01-05 LAB — BASIC METABOLIC PANEL
ANION GAP: 9 (ref 5–15)
BUN: 43 mg/dL — ABNORMAL HIGH (ref 6–23)
CHLORIDE: 106 mmol/L (ref 96–112)
CO2: 23 mmol/L (ref 19–32)
Calcium: 7.8 mg/dL — ABNORMAL LOW (ref 8.4–10.5)
Creatinine, Ser: 1.62 mg/dL — ABNORMAL HIGH (ref 0.50–1.35)
GFR calc non Af Amer: 46 mL/min — ABNORMAL LOW (ref >90)
GFR, EST AFRICAN AMERICAN: 53 mL/min — AB (ref >90)
GLUCOSE: 106 mg/dL — AB (ref 70–99)
Potassium: 3.1 mmol/L — ABNORMAL LOW (ref 3.5–5.1)
SODIUM: 138 mmol/L (ref 135–145)

## 2015-01-05 LAB — WOUND CULTURE
Culture: NO GROWTH
SPECIAL REQUESTS: NORMAL

## 2015-01-05 LAB — CBC
HCT: 30.3 % — ABNORMAL LOW (ref 39.0–52.0)
HEMOGLOBIN: 9.1 g/dL — AB (ref 13.0–17.0)
MCH: 21.8 pg — ABNORMAL LOW (ref 26.0–34.0)
MCHC: 30 g/dL (ref 30.0–36.0)
MCV: 72.5 fL — ABNORMAL LOW (ref 78.0–100.0)
Platelets: 571 10*3/uL — ABNORMAL HIGH (ref 150–400)
RBC: 4.18 MIL/uL — ABNORMAL LOW (ref 4.22–5.81)
RDW: 19 % — ABNORMAL HIGH (ref 11.5–15.5)
WBC: 8.1 10*3/uL (ref 4.0–10.5)

## 2015-01-05 LAB — VANCOMYCIN, RANDOM: VANCOMYCIN RM: 17.2 ug/mL

## 2015-01-05 MED ORDER — INSULIN ASPART 100 UNIT/ML ~~LOC~~ SOLN
0.0000 [IU] | Freq: Three times a day (TID) | SUBCUTANEOUS | Status: DC
  Administered 2015-01-05 – 2015-01-06 (×2): 4 [IU] via SUBCUTANEOUS
  Administered 2015-01-06: 7 [IU] via SUBCUTANEOUS
  Administered 2015-01-06: 4 [IU] via SUBCUTANEOUS
  Administered 2015-01-07: 3 [IU] via SUBCUTANEOUS
  Administered 2015-01-07: 7 [IU] via SUBCUTANEOUS
  Administered 2015-01-07 – 2015-01-08 (×2): 4 [IU] via SUBCUTANEOUS

## 2015-01-05 MED ORDER — VANCOMYCIN HCL IN DEXTROSE 1-5 GM/200ML-% IV SOLN
1000.0000 mg | Freq: Two times a day (BID) | INTRAVENOUS | Status: DC
  Administered 2015-01-05 – 2015-01-08 (×7): 1000 mg via INTRAVENOUS
  Filled 2015-01-05 (×7): qty 200

## 2015-01-05 MED ORDER — WARFARIN SODIUM 1 MG PO TABS
1.0000 mg | ORAL_TABLET | Freq: Once | ORAL | Status: AC
  Administered 2015-01-05: 1 mg via ORAL
  Filled 2015-01-05: qty 1

## 2015-01-05 MED ORDER — POTASSIUM CHLORIDE CRYS ER 20 MEQ PO TBCR
40.0000 meq | EXTENDED_RELEASE_TABLET | Freq: Two times a day (BID) | ORAL | Status: AC
  Administered 2015-01-05 (×2): 40 meq via ORAL
  Filled 2015-01-05 (×2): qty 2

## 2015-01-05 NOTE — Progress Notes (Signed)
  Subjective: Pt doing well. No acute changes  Objective: Vital signs in last 24 hours: Temp:  [97.6 F (36.4 C)-98.2 F (36.8 C)] 98 F (36.7 C) (04/10 0427) Pulse Rate:  [79-84] 79 (04/10 0427) Resp:  [18-20] 20 (04/10 0427) BP: (121-150)/(34-47) 132/41 mmHg (04/10 0427) SpO2:  [86 %-96 %] 95 % (04/10 0427) Last BM Date: 01/04/15  Intake/Output from previous day: 04/09 0701 - 04/10 0700 In: 3035 [P.O.:480; I.V.:2405; IV Piggyback:150] Out: 900 [Urine:700; Drains:200] Intake/Output this shift:    General appearance: alert and cooperative GI: soft, non-tender; bowel sounds normal; no masses,  no organomegaly and vacn inplace  Lab Results:   Recent Labs  01/03/15 0415 01/04/15 0534  WBC 18.9* 13.4*  HGB 8.5* 7.7*  HCT 28.7* 25.5*  PLT 409* 424*   BMET  Recent Labs  01/04/15 1714 01/05/15 0500  NA 137 138  K 3.3* 3.1*  CL 102 106  CO2 22 23  GLUCOSE 129* 106*  BUN 46* 43*  CREATININE 1.87* 1.62*  CALCIUM 8.1* 7.8*   PT/INR  Recent Labs  01/04/15 0534 01/05/15 0500  LABPROT 31.1* 31.9*  INR 2.97* 3.07*   ABG No results for input(s): PHART, HCO3 in the last 72 hours.  Invalid input(s): PCO2, PO2  Studies/Results: Dg Chest 2 View  01/03/2015   CLINICAL DATA:  Gastric surgery, diabetes, shortness of breath 12/31/2014  EXAM: CHEST  2 VIEW  COMPARISON:  12/30/2014  FINDINGS: Bilateral perihilar interstitial thickening without focal consolidation. There a linear band of fibrosis in the lingula. No pleural effusion or pneumothorax. Stable cardiomediastinal silhouette.  The osseous structures are unremarkable.  IMPRESSION: Bilateral perihilar interstitial thickening, which may reflect atelectasis versus infection.   Electronically Signed   By: Kathreen Devoid   On: 01/03/2015 10:03    Anti-infectives: Anti-infectives    Start     Dose/Rate Route Frequency Ordered Stop   01/04/15 1830  vancomycin (VANCOCIN) 1,500 mg in sodium chloride 0.9 % 500 mL IVPB     1,500 mg 250 mL/hr over 120 Minutes Intravenous  Once 01/04/15 1805 01/04/15 2219   01/03/15 0600  vancomycin (VANCOCIN) 1,500 mg in sodium chloride 0.9 % 500 mL IVPB  Status:  Discontinued     1,500 mg 250 mL/hr over 120 Minutes Intravenous Every 12 hours 01/02/15 2147 01/03/15 1755   01/02/15 1545  vancomycin (VANCOCIN) 2,500 mg in sodium chloride 0.9 % 500 mL IVPB     2,500 mg 250 mL/hr over 120 Minutes Intravenous  Once 01/02/15 1542 01/02/15 1821   12/31/14 1700  piperacillin-tazobactam (ZOSYN) IVPB 3.375 g     3.375 g 12.5 mL/hr over 240 Minutes Intravenous Every 8 hours 12/31/14 1536        MICRO Cdiff neg BCx: NGTD Wound Cx: NGTD  Assessment/Plan:  Panniculitis Con't abx as per ID-WBC trending down. todays CBC pending Cx pending Vac in place, change in Monday Mobilize   LOS: 5 days    Rosario Jacks., [facility] 01/05/2015

## 2015-01-05 NOTE — Progress Notes (Signed)
Pt already on home CPAP when RT stopped by to check on Pt.  Pt tolerating well at this time, RT to monitor and assess as needed.

## 2015-01-05 NOTE — Progress Notes (Signed)
INFECTIOUS DISEASE PROGRESS NOTE  ID: Joseph [name]. is a 59 y.o. male with  Principal Problem:   Abscess of postoperative wound of abdominal wall Active Problems:   Morbid obesity BMI 65   Essential hypertension, benign   Diabetes   Panniculitis   OSA (obstructive sleep apnea)   Hx pulmonary embolism   Status post panniculectomy Oct 2015   S/P IVC filter-temporary REMOVED   Esophageal reflux   Polyneuropathy in diabetes(357.2)   Acute renal insufficiency  Subjective: C/o breakdown of vac  Abtx:  Anti-infectives    Start     Dose/Rate Route Frequency Ordered Stop   01/04/15 1830  vancomycin (VANCOCIN) 1,500 mg in sodium chloride 0.9 % 500 mL IVPB     1,500 mg 250 mL/hr over 120 Minutes Intravenous  Once 01/04/15 1805 01/04/15 2219   01/03/15 0600  vancomycin (VANCOCIN) 1,500 mg in sodium chloride 0.9 % 500 mL IVPB  Status:  Discontinued     1,500 mg 250 mL/hr over 120 Minutes Intravenous Every 12 hours 01/02/15 2147 01/03/15 1755   01/02/15 1545  vancomycin (VANCOCIN) 2,500 mg in sodium chloride 0.9 % 500 mL IVPB     2,500 mg 250 mL/hr over 120 Minutes Intravenous  Once 01/02/15 1542 01/02/15 1821   12/31/14 1700  piperacillin-tazobactam (ZOSYN) IVPB 3.375 g     3.375 g 12.5 mL/hr over 240 Minutes Intravenous Every 8 hours 12/31/14 1536        Medications:  Scheduled: . amLODipine  5 mg Oral Daily  . docusate sodium  100 mg Oral BID  . FLUoxetine  40 mg Oral QHS  . furosemide  20 mg Oral Daily  . indapamide  5 mg Oral Daily  . insulin aspart  0-20 Units Subcutaneous 6 times per day  . insulin aspart  0-20 Units Subcutaneous TID WC  . insulin aspart  6 Units Subcutaneous TID WC  . insulin glargine  50 Units Subcutaneous QHS  . lisinopril  20 mg Oral Daily  . pantoprazole (PROTONIX) IV  40 mg Intravenous QHS  . pioglitazone  45 mg Oral Daily  . piperacillin-tazobactam (ZOSYN)  IV  3.375 g Intravenous Q8H  . potassium chloride  40 mEq Oral BID  . warfarin  1  mg Oral ONCE-1800  . Warfarin - Pharmacist Dosing Inpatient   Does not apply q1800    Objective: Vital signs in last 24 hours: Temp:  [97.8 F (36.6 C)-98.2 F (36.8 C)] 98 F (36.7 C) (04/10 0427) Pulse Rate:  [79-84] 84 (04/10 1006) Resp:  [16-20] 16 (04/10 1006) BP: (121-150)/(34-46) 148/44 mmHg (04/10 1006) SpO2:  [90 %-96 %] 94 % (04/10 1006) Weight:  [204.119 kg (450 lb)] 204.119 kg (450 lb) (04/10 1006)   General appearance: alert, cooperative and no distress Incision/Wound: decreased induration of lateral wound. The dressing is loosened on the medial side of his lateral wound.    Lab Results  Recent Labs  01/03/15 0415 01/04/15 0534 01/04/15 1714 01/05/15 0500  WBC 18.9* 13.4*  --   --   HGB 8.5* 7.7*  --   --   HCT 28.7* 25.5*  --   --   NA 135  --  137 138  K 3.6  --  3.3* 3.1*  CL 99  --  102 106  CO2 23  --  22 23  BUN 46*  --  46* 43*  CREATININE 2.16*  --  1.87* 1.62*   Liver Panel No results for input(s):  PROT, ALBUMIN, AST, ALT, ALKPHOS, BILITOT, BILIDIR, IBILI in the last 72 hours. Sedimentation Rate No results for input(s): ESRSEDRATE in the last 72 hours. C-Reactive Protein No results for input(s): CRP in the last 72 hours.  Microbiology: Recent Results (from the past 240 hour(s))  Blood culture (routine x 2)     Status: None (Preliminary result)   Collection Time: 12/30/14  8:17 PM  Result Value Ref Range Status   Specimen Description BLOOD LEFT HAND  Final   Special Requests BOTTLES DRAWN AEROBIC ONLY 3 CC  Final   Culture   Final           BLOOD CULTURE RECEIVED NO GROWTH TO DATE CULTURE WILL BE HELD FOR 5 DAYS BEFORE ISSUING A FINAL NEGATIVE REPORT Performed at [facility]    Report Status PENDING  Incomplete  Blood culture (routine x 2)     Status: None (Preliminary result)   Collection Time: 12/30/14  8:33 PM  Result Value Ref Range Status   Specimen Description BLOOD LEFT FOREARM  Final   Special Requests BOTTLES DRAWN  AEROBIC AND ANAEROBIC 4CC  Final   Culture   Final           BLOOD CULTURE RECEIVED NO GROWTH TO DATE CULTURE WILL BE HELD FOR 5 DAYS BEFORE ISSUING A FINAL NEGATIVE REPORT Performed at [facility]    Report Status PENDING  Incomplete  Wound culture     Status: None   Collection Time: 01/03/15  1:47 PM  Result Value Ref Range Status   Specimen Description ABDOMEN  Final   Special Requests Normal  Final   Gram Stain   Final    ABUNDANT WBC PRESENT,BOTH PMN AND MONONUCLEAR NO SQUAMOUS EPITHELIAL CELLS SEEN NO ORGANISMS SEEN Performed at [facility]    Culture   Final    NO GROWTH 2 DAYS Performed at [facility]    Report Status 01/05/2015 FINAL  Final  Clostridium Difficile by PCR     Status: None   Collection Time: 01/04/15 12:15 PM  Result Value Ref Range Status   C difficile by pcr NEGATIVE NEGATIVE Final    Studies/Results: No results found.   Assessment/Plan: Morbid Obesity OSA Panniculiectomy Abscess of abd wall Loose BM  Total days of antibiotics: 6 zosyn  His Cx are (-), due to prior anbx? C diff is negative.  Repeat CBC in AM.  No change in anbx Dr Megan Salon returns in AM          [facility] Infectious Diseases (pager) [phone] [email or web address] 01/05/2015, 12:27 PM  LOS: 5 days

## 2015-01-05 NOTE — Progress Notes (Addendum)
ANTIBIOTIC CONSULT NOTE  Pharmacy Consult for Vancomycin Indication: wound infection  No Known Allergies  Patient Measurements: Height: 6\' 1"  (185.4 cm) Weight: (!) 436 lb 4.7 oz (197.9 kg) IBW/kg (Calculated) : 79.9  Vital Signs: Temp: 98 F (36.7 C) (04/10 0427) Temp Source: Oral (04/10 0427) BP: 132/41 mmHg (04/10 0427) Pulse Rate: 79 (04/10 0427) Intake/Output from previous day: 04/09 0701 - 04/10 0700 In: 3035 [P.O.:480; I.V.:2405; IV Piggyback:150] Out: 900 [Urine:700; Drains:200] Intake/Output from this shift:    Labs:  Recent Labs  01/03/15 0415 01/04/15 0534 01/04/15 1714 01/05/15 0500  WBC 18.9* 13.4*  --   --   HGB 8.5* 7.7*  --   --   PLT 409* 424*  --   --   CREATININE 2.16*  --  1.87* 1.62*   Estimated Creatinine Clearance: 90.4 mL/min (by C-G formula based on Cr of 1.62).  Recent Labs  01/03/15 1650 01/04/15 1714  VANCOTROUGH 28.6*  --   VANCORANDOM  --  15.3     Microbiology: Recent Results (from the past 720 hour(s))  Blood culture (routine x 2)     Status: None (Preliminary result)   Collection Time: 12/30/14  8:17 PM  Result Value Ref Range Status   Specimen Description BLOOD LEFT HAND  Final   Special Requests BOTTLES DRAWN AEROBIC ONLY 3 CC  Final   Culture   Final           BLOOD CULTURE RECEIVED NO GROWTH TO DATE CULTURE WILL BE HELD FOR 5 DAYS BEFORE ISSUING A FINAL NEGATIVE REPORT Performed at [facility]    Report Status PENDING  Incomplete  Blood culture (routine x 2)     Status: None (Preliminary result)   Collection Time: 12/30/14  8:33 PM  Result Value Ref Range Status   Specimen Description BLOOD LEFT FOREARM  Final   Special Requests BOTTLES DRAWN AEROBIC AND ANAEROBIC 4CC  Final   Culture   Final           BLOOD CULTURE RECEIVED NO GROWTH TO DATE CULTURE WILL BE HELD FOR 5 DAYS BEFORE ISSUING A FINAL NEGATIVE REPORT Performed at [facility]    Report Status PENDING  Incomplete  Wound culture      Status: None (Preliminary result)   Collection Time: 01/03/15  1:47 PM  Result Value Ref Range Status   Specimen Description ABDOMEN  Final   Special Requests Normal  Final   Gram Stain   Final    ABUNDANT WBC PRESENT,BOTH PMN AND MONONUCLEAR NO SQUAMOUS EPITHELIAL CELLS SEEN NO ORGANISMS SEEN Performed at [facility]    Culture   Final    NO GROWTH 1 DAY Performed at [facility]    Report Status PENDING  Incomplete  Clostridium Difficile by PCR     Status: None   Collection Time: 01/04/15 12:15 PM  Result Value Ref Range Status   C difficile by pcr NEGATIVE NEGATIVE Final    Anti-infectives    Start     Dose/Rate Route Frequency Ordered Stop   01/04/15 1830  vancomycin (VANCOCIN) 1,500 mg in sodium chloride 0.9 % 500 mL IVPB     1,500 mg 250 mL/hr over 120 Minutes Intravenous  Once 01/04/15 1805 01/04/15 2219   01/03/15 0600  vancomycin (VANCOCIN) 1,500 mg in sodium chloride 0.9 % 500 mL IVPB  Status:  Discontinued     1,500 mg 250 mL/hr over 120 Minutes Intravenous Every 12 hours 01/02/15 2147 01/03/15  1755   01/02/15 1545  vancomycin (VANCOCIN) 2,500 mg in sodium chloride 0.9 % 500 mL IVPB     2,500 mg 250 mL/hr over 120 Minutes Intravenous  Once 01/02/15 1542 01/02/15 1821   12/31/14 1700  piperacillin-tazobactam (ZOSYN) IVPB 3.375 g     3.375 g 12.5 mL/hr over 240 Minutes Intravenous Every 8 hours 12/31/14 1536       Assessment: 75 yoM s/p panniculectomy 10/15, difficulty with wound healing, placed wound VAC 1/16, admit 4/5 for decreased VAC output, abscess and fistula noted on CT. 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   Admit 4/5 started on Zosyn, add Vancomycin per ID MD, pharmacy to dose  SCr increasing since admit  4/8 drainage of infected hematoma in RLQ w/ VAC.  MD thinks this is source of leukocytosis and fever  4/5 >> Zosyn >>  4/8 >> vancomycin >>  Tmax: afeb since 4/8 WBCs: no CBC today, but trending down as of 4/9 Renal: AKI, but  SCr continues to improve to 1.62 from 2.16; CrCl 90 CG  4/8 wound Cx: NGTD  Drug level / dose changes info: 4/8 1700 VT = 28.6 after 2.5g load and first dose of 1.5g q12h, not at Css yet but drawn because SCr was rising. Dose given after blood drawn. 4/9 1700 VRm = 15.3; gave 1500 mg x 1 at 8pm; SCr improved   Goal of Therapy:   Vancomycin trough level 15-20 mcg/ml   Eradication of infection  Appropriate antibiotic dosing for indication and renal function  Plan:  Day 6 total antibiotics, Day 3 vancomycin  Zosyn dosing appropriate for renal function  Check random vancomycin level at 1200; with improved renal function, I suspect 1000-1250 mg q12 hr may be appropriate  Follow clinical course, renal function, culture results as available  Follow for de-escalation of antibiotics and LOT   Reuel Boom, PharmD Pager: [phone] 01/05/2015, 8:32 AM    Addendum:  1220 random vancomycin level was 17.2 with 1500 mg given about 16 hrs earlier.  Will begin 1000 mg IV q12 hr and follow renal function closely as it appears to be improving.  Check additional troughs as necessary.  Reuel Boom, PharmD Pager: [phone] 01/05/2015, 1:01 PM

## 2015-01-05 NOTE — Progress Notes (Signed)
ANTICOAGULATION CONSULT NOTE  Pharmacy Consult for coumadin Indication: hx PE/DVT  No Known Allergies  Patient Measurements: Height: 6\' 1"  (185.4 cm) Weight: (!) 436 lb 4.7 oz (197.9 kg) IBW/kg (Calculated) : 79.9  Vital Signs: Temp: 98 F (36.7 C) (04/10 0427) Temp Source: Oral (04/10 0427) BP: 132/41 mmHg (04/10 0427) Pulse Rate: 79 (04/10 0427)  Labs:  Recent Labs  01/03/15 0415 01/04/15 0534 01/04/15 1714 01/05/15 0500  HGB 8.5* 7.7*  --   --   HCT 28.7* 25.5*  --   --   PLT 409* 424*  --   --   LABPROT 25.0* 31.1*  --  31.9*  INR 2.25* 2.97*  --  3.07*  CREATININE 2.16*  --  1.87* 1.62*    Estimated Creatinine Clearance: 90.4 mL/min (by C-G formula based on Cr of 1.62).   Medical History: Past Medical History  Diagnosis Date  . Morbid obesity   . Pure hypercholesterolemia   . Essential hypertension, benign   . 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 Esophageal reflux   . Type II or unspecified type diabetes mellitus without mention of complication, not stated as uncontrolled   . Anxiety state, unspecified   . Essential hypertension, benign   . Varicose veins of lower extremities with inflammation   . Peripheral vascular disease, unspecified   . Edema   . Type II or unspecified type diabetes mellitus with neurological manifestations, not stated as uncontrolled   . Polyneuropathy in diabetes(357.2)   . Personal history of PE (pulmonary embolism) 2004  . Depression   . Unspecified sleep apnea     uses CPAP  . Obstructive sleep apnea (adult) (pediatric)   . DVT (deep venous thrombosis) 2004  . Shortness of breath     DUE TO WEIGHT AND LOSS OF MOBILITY  . Myalgia and myositis, unspecified     BACK AND LEGS  . Falls frequently   . Poor venous access     PT STATES DIFFICULT TO DRAW BLOOD FROM HIS VEINS   Assessment: Patient is a 59 y.o m on coumadin PTA for hx PE/DVT.  He [facility] to [facility] with  panniculitis and currently on abx for infection. Pharmacy is consulted to manage coumadin therapy for patient.  Home dose: 5 mg daily except 7.5 mg on Mondays.   Today, 01/05/2015:  INR SUPRAtherapeutic, but only slight increase from yesterday.  No CBC today; Hgb low yesterday.  Per RN, minimal drainage from wound vac today, no other bleeding issues.  No significant drug-drug interx  Consumed 100% of diet yesterday  SCr elevated but improving   Goal of Therapy:  INR 2-3    Plan:   Warfarin 1 mg PO x 1 tonight.  Upward INR trend yesterday proved to be real, but only slightly above goal and want to avoid a rebound effect from holding doses.  For now, would resume warfarin at a lower dose (i.e. 3 mg daily) once back in therapeutic range.  Daily INR; CBC at least q72 hr while on warfarin  Monitor for signs of bleeding  Reuel Boom, PharmD Pager: [phone] 01/05/2015, 8:14 AM

## 2015-01-05 NOTE — Progress Notes (Signed)
Dr. Rosendo Gros aware via phone of wife and pt concerns about drainage (sersanguineous) seeping out from under outer aspect of wound vac dressing midway between each wound vac site. Drainage amounts minimal. Gauze and abd pads used to protect linen and gown. Wound Vacs alarms quiet with no leakage detected. Pt and wife reassured. No new orders received. Md advised to keep the gauze dressing in place and that Dr. Excell Seltzer would see pt in am.

## 2015-01-05 NOTE — Progress Notes (Signed)
Re: I & O's.  Pt reports frequent urination which he discussed with the doctor.  Reminded pt (yesterday and today) of importance of using urinal or toilet hat to enable staff to accurately track his output.  Pt agreed and said he will use hat.

## 2015-01-06 LAB — CBC WITH DIFFERENTIAL/PLATELET
BASOS PCT: 1 % (ref 0–1)
Basophils Absolute: 0.1 10*3/uL (ref 0.0–0.1)
EOS ABS: 0.5 10*3/uL (ref 0.0–0.7)
Eosinophils Relative: 6 % — ABNORMAL HIGH (ref 0–5)
HEMATOCRIT: 29.8 % — AB (ref 39.0–52.0)
Hemoglobin: 9 g/dL — ABNORMAL LOW (ref 13.0–17.0)
Lymphocytes Relative: 28 % (ref 12–46)
Lymphs Abs: 2.2 10*3/uL (ref 0.7–4.0)
MCH: 22 pg — ABNORMAL LOW (ref 26.0–34.0)
MCHC: 30.2 g/dL (ref 30.0–36.0)
MCV: 72.9 fL — ABNORMAL LOW (ref 78.0–100.0)
Monocytes Absolute: 0.5 10*3/uL (ref 0.1–1.0)
Monocytes Relative: 6 % (ref 3–12)
NEUTROS ABS: 4.5 10*3/uL (ref 1.7–7.7)
Neutrophils Relative %: 59 % (ref 43–77)
Platelets: 596 10*3/uL — ABNORMAL HIGH (ref 150–400)
RBC: 4.09 MIL/uL — ABNORMAL LOW (ref 4.22–5.81)
RDW: 18.9 % — ABNORMAL HIGH (ref 11.5–15.5)
WBC: 7.8 10*3/uL (ref 4.0–10.5)

## 2015-01-06 LAB — GLUCOSE, CAPILLARY
GLUCOSE-CAPILLARY: 162 mg/dL — AB (ref 70–99)
GLUCOSE-CAPILLARY: 248 mg/dL — AB (ref 70–99)
Glucose-Capillary: 196 mg/dL — ABNORMAL HIGH (ref 70–99)
Glucose-Capillary: 205 mg/dL — ABNORMAL HIGH (ref 70–99)

## 2015-01-06 LAB — CULTURE, BLOOD (ROUTINE X 2)
CULTURE: NO GROWTH
Culture: NO GROWTH

## 2015-01-06 LAB — PROTIME-INR
INR: 2.58 — ABNORMAL HIGH (ref 0.00–1.49)
Prothrombin Time: 27.9 seconds — ABNORMAL HIGH (ref 11.6–15.2)

## 2015-01-06 MED ORDER — CIPROFLOXACIN HCL 750 MG PO TABS
750.0000 mg | ORAL_TABLET | Freq: Two times a day (BID) | ORAL | Status: DC
  Administered 2015-01-06 – 2015-01-08 (×4): 750 mg via ORAL
  Filled 2015-01-06 (×6): qty 1

## 2015-01-06 MED ORDER — WARFARIN SODIUM 2.5 MG PO TABS
2.5000 mg | ORAL_TABLET | Freq: Once | ORAL | Status: AC
  Administered 2015-01-06: 2.5 mg via ORAL
  Filled 2015-01-06: qty 1

## 2015-01-06 MED ORDER — METRONIDAZOLE 500 MG PO TABS
500.0000 mg | ORAL_TABLET | Freq: Three times a day (TID) | ORAL | Status: DC
  Administered 2015-01-06 – 2015-01-08 (×6): 500 mg via ORAL
  Filled 2015-01-06 (×9): qty 1

## 2015-01-06 NOTE — Progress Notes (Signed)
Patient ID: Joseph [name]., male   DOB: [birth year], 59 y.o.   MRN: [id number]    Subjective: Pt and wife concerned about swelling in lower abdomen and feet.  No other C/O  Objective: Vital signs in last 24 hours: Temp:  [97.4 F (36.3 C)-97.6 F (36.4 C)] 97.4 F (36.3 C) (04/11 0540) Pulse Rate:  [78-90] 78 (04/11 0540) Resp:  [16-18] 18 (04/11 0540) BP: (127-169)/(40-58) 127/40 mmHg (04/11 0540) SpO2:  [94 %-96 %] 94 % (04/11 0540) Weight:  [204.391 kg (450 lb 9.6 oz)] 204.391 kg (450 lb 9.6 oz) (04/11 0600) Last BM Date: 01/04/15  Intake/Output from previous day: 04/10 0701 - 04/11 0700 In: 4525 [P.O.:1980; I.V.:2195; IV Piggyback:350] Out: 2870 [Urine:2750; Drains:120] Intake/Output this shift: Total I/O In: -  Out: 700 [Urine:700]  General appearance: alert, cooperative and no distress Incision/Wound: VAC in place.  Some edema of lower abd wall. No erythema or unusual drainage  Extremities:  1+ edema  Lab Results:   Recent Labs  01/05/15 1220 01/06/15 0512  WBC 8.1 7.8  HGB 9.1* 9.0*  HCT 30.3* 29.8*  PLT 571* 596*   BMET  Recent Labs  01/04/15 1714 01/05/15 0500  NA 137 138  K 3.3* 3.1*  CL 102 106  CO2 22 23  GLUCOSE 129* 106*  BUN 46* 43*  CREATININE 1.87* 1.62*  CALCIUM 8.1* 7.8*     Studies/Results: No results found.  Anti-infectives: Anti-infectives    Start     Dose/Rate Route Frequency Ordered Stop   01/05/15 1600  vancomycin (VANCOCIN) IVPB 1000 mg/200 mL premix     1,000 mg 200 mL/hr over 60 Minutes Intravenous Every 12 hours 01/05/15 1331     01/04/15 1830  vancomycin (VANCOCIN) 1,500 mg in sodium chloride 0.9 % 500 mL IVPB     1,500 mg 250 mL/hr over 120 Minutes Intravenous  Once 01/04/15 1805 01/04/15 2219   01/03/15 0600  vancomycin (VANCOCIN) 1,500 mg in sodium chloride 0.9 % 500 mL IVPB  Status:  Discontinued     1,500 mg 250 mL/hr over 120 Minutes Intravenous Every 12 hours 01/02/15 2147 01/03/15 1755   01/02/15 1545   vancomycin (VANCOCIN) 2,500 mg in sodium chloride 0.9 % 500 mL IVPB     2,500 mg 250 mL/hr over 120 Minutes Intravenous  Once 01/02/15 1542 01/02/15 1821   12/31/14 1700  piperacillin-tazobactam (ZOSYN) IVPB 3.375 g     3.375 g 12.5 mL/hr over 240 Minutes Intravenous Every 8 hours 12/31/14 1536        Assessment/Plan: S/P I&D panniculectomy wound abscess.  VAC in place. No current signs of infection. For VAC change today Appears to have some fluid overload, decrease IV fluid    LOS: 6 days    Dequita Schleicher T 01/06/2015

## 2015-01-06 NOTE — Consult Note (Signed)
WOC wound consult note Reason for Consult: NPWT change to nonhealing surgical wounds to mid abdomen and new wound to right abdomen (opened by surgeon on Friday). Wound type: Nonhealing surgical wounds with infection present.  Pressure Ulcer POA: N/A Measurement:Right lower abdomen distal : 1.5cm x 1.0cmx 6cm  Right lower abdomen medial: (New) 6cm x 6 cm x 8cm  Mid abdomen tunnel 1: medial 0.5cm x 0.5cm x 8cm; tunnel 2 lateral 1cm x 1cm x 10cm  Wound bed: New wound right abdomen had numerous large clots, removed gently.  No bleeding noted.  Other wounds are unable to visualize wound beds as they are deep tunneling wounds.  Drainage (amount, consistency, odor) New right abdomen wound had full canister of bloody output that is changed today.  Wife states this has decreased a lot and there is no bleeding noted at dressing change today.  Periwound: Denuded skin from 10 to 2 o'clock on new right abdomen wound.  This area is protected with a barrier ring.  Bridging technique is used with all wounds to protect periwound skin.  Dressing procedure/placement/frequency: Cleanse all wounds with NS and pat gently dry.  2 openings on mid abdomen are filled with white foam and bridged with black foam.  Suction immediately achieved at 125 mmHg.  Right abdominal wounds, distal opening is filled with white foam and new right abdominal wound is filled with black foam.  Bridging technique used and barrier ring to protect periwound skin.  Change Mon/Wed/Fri.  Will not follow at this time.  Please re-consult if needed.  Domenic Moras RN BSN [place] Pager [phone]

## 2015-01-06 NOTE — Progress Notes (Addendum)
Patient ID: Joseph [name]., male   DOB: 05/12/1956, 59 y.o.   MRN: [id number]         [facility]    Date of Admission:  12/31/2014           Day 7 piperacillin tazobactam                    Day 4 vancomycin  Principal Problem:   Abscess of postoperative wound of abdominal wall Active Problems:   Status post panniculectomy Oct 2015   Morbid obesity BMI 65   Essential hypertension, benign   Diabetes   Panniculitis   OSA (obstructive sleep apnea)   Hx pulmonary embolism   S/P IVC filter-temporary REMOVED   Esophageal reflux   Polyneuropathy in diabetes(357.2)   Acute renal insufficiency   . amLODipine  5 mg Oral Daily  . docusate sodium  100 mg Oral BID  . FLUoxetine  40 mg Oral QHS  . furosemide  20 mg Oral Daily  . indapamide  5 mg Oral Daily  . insulin aspart  0-20 Units Subcutaneous TID WC  . insulin glargine  50 Units Subcutaneous QHS  . lisinopril  20 mg Oral Daily  . pantoprazole (PROTONIX) IV  40 mg Intravenous QHS  . pioglitazone  45 mg Oral Daily  . piperacillin-tazobactam (ZOSYN)  IV  3.375 g Intravenous Q8H  . vancomycin  1,000 mg Intravenous Q12H  . warfarin  2.5 mg Oral ONCE-1800  . Warfarin - Pharmacist Dosing Inpatient   Does not apply q1800    Subjective: He is feeling much better. He is not having any abdominal pain. His shortness of breath has improved and his appetite is coming back.  Review of Systems: Pertinent items are noted in HPI.  Past Medical History  Diagnosis Date  . Morbid obesity   . Pure hypercholesterolemia   . Essential hypertension, benign   . 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 Esophageal reflux   . Type II or unspecified type diabetes mellitus without mention of complication, not stated as uncontrolled   . Anxiety state, unspecified   . Essential hypertension, benign   . Varicose veins of lower extremities with inflammation   .  Peripheral vascular disease, unspecified   . Edema   . Type II or unspecified type diabetes mellitus with neurological manifestations, not stated as uncontrolled   . Polyneuropathy in diabetes(357.2)   . Personal history of PE (pulmonary embolism) 2004  . Depression   . Unspecified sleep apnea     uses CPAP  . Obstructive sleep apnea (adult) (pediatric)   . DVT (deep venous thrombosis) 2004  . Shortness of breath     DUE TO WEIGHT AND LOSS OF MOBILITY  . Myalgia and myositis, unspecified     BACK AND LEGS  . Falls frequently   . Poor venous access     PT STATES DIFFICULT TO DRAW BLOOD FROM HIS VEINS    History  Substance Use Topics  . Smoking status: Never Smoker   . Smokeless tobacco: Never Used  . Alcohol Use: No    History reviewed. No pertinent family history. No Known Allergies  OBJECTIVE: Blood pressure 142/49, pulse 85, temperature 97.7 F (36.5 C), temperature source Oral, resp. rate 18, height 6\' 1"  (1.854 m), weight 450 lb 9.6 oz (204.391 kg), SpO2 97 %.   General:  He looks much better than when I saw  him last week. He is alert and smiling sitting up in a chair eating lunch.  Skin:  No rash Lungs:  Clear anteriorly Cor:  Distant but regular S1 and S2 with no murmurs Abdomen:  Obese. Soft and nontender. 120 mL of recorded bloody drainage from vac drains the past 24 hours  Extremities: 1+ pitting edema  Lab Results Lab Results  Component Value Date   WBC 7.8 01/06/2015   HGB 9.0* 01/06/2015   HCT 29.8* 01/06/2015   MCV 72.9* 01/06/2015   PLT 596* 01/06/2015    Lab Results  Component Value Date   CREATININE 1.62* 01/05/2015   BUN 43* 01/05/2015   NA 138 01/05/2015   K 3.1* 01/05/2015   CL 106 01/05/2015   CO2 23 01/05/2015    Lab Results  Component Value Date   ALT 17 07/05/2008   AST 16 07/05/2008   ALKPHOS 75 07/05/2008   BILITOT 0.7 07/05/2008     Microbiology: Recent Results (from the past 240 hour(s))  Blood culture (routine x 2)      Status: None   Collection Time: 12/30/14  8:17 PM  Result Value Ref Range Status   Specimen Description BLOOD LEFT HAND  Final   Special Requests BOTTLES DRAWN AEROBIC ONLY 3 CC  Final   Culture   Final    NO GROWTH 5 DAYS Performed at [facility]    Report Status 01/06/2015 FINAL  Final  Blood culture (routine x 2)     Status: None   Collection Time: 12/30/14  8:33 PM  Result Value Ref Range Status   Specimen Description BLOOD LEFT FOREARM  Final   Special Requests BOTTLES DRAWN AEROBIC AND ANAEROBIC 4CC  Final   Culture   Final    NO GROWTH 5 DAYS Performed at [facility]    Report Status 01/06/2015 FINAL  Final  Wound culture     Status: None   Collection Time: 01/03/15  1:47 PM  Result Value Ref Range Status   Specimen Description ABDOMEN  Final   Special Requests Normal  Final   Gram Stain   Final    ABUNDANT WBC PRESENT,BOTH PMN AND MONONUCLEAR NO SQUAMOUS EPITHELIAL CELLS SEEN NO ORGANISMS SEEN Performed at [facility]    Culture   Final    NO GROWTH 2 DAYS Performed at [facility]    Report Status 01/05/2015 FINAL  Final  Clostridium Difficile by PCR     Status: None   Collection Time: 01/04/15 12:15 PM  Result Value Ref Range Status   C difficile by pcr NEGATIVE NEGATIVE Final   Assessment: He improved promptly and dramatically after his right lower quadrant abscess was drained 3 days ago. Abscess Gram stain and cultures are negative so I will continue broad empiric antibiotics. I will leave him on IV vancomycin but switch piperacillin tazobactam to oral ciprofloxacin and metronidazole. I suspect he will need several weeks of therapy to cure this complex abdominal wound infection.   Plan: 1. Continue vancomycin  2. Change piperacillin tazobactam to oral ciprofloxacin and metronidazole  3. PICC placement soon if creatinine continues to decline  Michel Bickers, MD [facility] [place] [phone] pager   [phone] cell 01/06/2015, 3:08 PM

## 2015-01-06 NOTE — Progress Notes (Signed)
ANTICOAGULATION CONSULT NOTE  Pharmacy Consult for coumadin Indication: hx PE/DVT  No Known Allergies  Patient Measurements: Height: 6\' 1"  (185.4 cm) Weight: (!) 450 lb 9.6 oz (204.391 kg) IBW/kg (Calculated) : 79.9  Vital Signs: Temp: 97.4 F (36.3 C) (04/11 0540) Temp Source: Oral (04/11 0540) BP: 127/40 mmHg (04/11 0540) Pulse Rate: 78 (04/11 0540)  Labs:  Recent Labs  01/04/15 0534 01/04/15 1714 01/05/15 0500 01/05/15 1220 01/06/15 0512  HGB 7.7*  --   --  9.1* 9.0*  HCT 25.5*  --   --  30.3* 29.8*  PLT 424*  --   --  571* 596*  LABPROT 31.1*  --  31.9*  --  27.9*  INR 2.97*  --  3.07*  --  2.58*  CREATININE  --  1.87* 1.62*  --   --     Estimated Creatinine Clearance: 92.3 mL/min (by C-G formula based on Cr of 1.62).   Medical History: Past Medical History  Diagnosis Date  . Morbid obesity   . Pure hypercholesterolemia   . Essential hypertension, benign   . 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 Esophageal reflux   . Type II or unspecified type diabetes mellitus without mention of complication, not stated as uncontrolled   . Anxiety state, unspecified   . Essential hypertension, benign   . Varicose veins of lower extremities with inflammation   . Peripheral vascular disease, unspecified   . Edema   . Type II or unspecified type diabetes mellitus with neurological manifestations, not stated as uncontrolled   . Polyneuropathy in diabetes(357.2)   . Personal history of PE (pulmonary embolism) 2004  . Depression   . Unspecified sleep apnea     uses CPAP  . Obstructive sleep apnea (adult) (pediatric)   . DVT (deep venous thrombosis) 2004  . Shortness of breath     DUE TO WEIGHT AND LOSS OF MOBILITY  . Myalgia and myositis, unspecified     BACK AND LEGS  . Falls frequently   . Poor venous access     PT STATES DIFFICULT TO DRAW BLOOD FROM HIS VEINS   Assessment: Patient is a 59 y.o m on  coumadin PTA for hx PE/DVT.  He presented to [facility] with panniculitis and currently on abx for infection. Pharmacy is consulted to manage coumadin therapy for patient.  Home dose: 5 mg daily except 7.5 mg on Mondays.   Today, 01/06/2015:  INR 2.58, therapeutic.  Hgb low but stable, plt ok  Per RN, minimal drainage from wound vac today, no other bleeding issues.  No significant drug-drug interx  Consuming 100% of diet   SCr elevated but improving (no lab today)   Goal of Therapy:  INR 2-3    Plan:   Warfarin 2.5mg  x 1 @ 1800  Daily INR; CBC at least q72 hr while on warfarin  Monitor for signs of bleeding  Dolly Rias RPh 01/06/2015, 11:36 AM Pager [phone]

## 2015-01-06 NOTE — Progress Notes (Signed)
Pt's incision on left lower side is slightly herniated. Covered with dry dressing.

## 2015-01-07 LAB — CREATININE, SERUM
CREATININE: 1.14 mg/dL (ref 0.50–1.35)
GFR calc Af Amer: 81 mL/min — ABNORMAL LOW (ref >90)
GFR, EST NON AFRICAN AMERICAN: 70 mL/min — AB (ref >90)

## 2015-01-07 LAB — PROTIME-INR
INR: 3.04 — ABNORMAL HIGH (ref 0.00–1.49)
Prothrombin Time: 31.7 seconds — ABNORMAL HIGH (ref 11.6–15.2)

## 2015-01-07 LAB — GLUCOSE, CAPILLARY
GLUCOSE-CAPILLARY: 187 mg/dL — AB (ref 70–99)
GLUCOSE-CAPILLARY: 237 mg/dL — AB (ref 70–99)
Glucose-Capillary: 231 mg/dL — ABNORMAL HIGH (ref 70–99)
Glucose-Capillary: 143 mg/dL — ABNORMAL HIGH (ref 70–99)

## 2015-01-07 MED ORDER — WARFARIN SODIUM 1 MG PO TABS
1.0000 mg | ORAL_TABLET | Freq: Once | ORAL | Status: AC
  Administered 2015-01-07: 1 mg via ORAL
  Filled 2015-01-07: qty 1

## 2015-01-07 MED ORDER — METFORMIN HCL 500 MG PO TABS
1000.0000 mg | ORAL_TABLET | Freq: Two times a day (BID) | ORAL | Status: DC
  Administered 2015-01-07 – 2015-01-08 (×3): 1000 mg via ORAL
  Filled 2015-01-07 (×5): qty 2

## 2015-01-07 MED ORDER — SODIUM CHLORIDE 0.9 % IJ SOLN
10.0000 mL | INTRAMUSCULAR | Status: DC | PRN
  Administered 2015-01-08 (×2): 10 mL
  Filled 2015-01-07 (×2): qty 40

## 2015-01-07 NOTE — Progress Notes (Signed)
Patient ID: Joseph Duchateau., male   DOB: 1956-06-25, 59 y.o.   MRN: [id number]         [place]    Date of Admission:  12/31/2014           Day 8 antibiotics                     Principal Problem:   Abscess of postoperative wound of abdominal wall Active Problems:   Status post panniculectomy Oct 2015   Morbid obesity BMI 65   Essential hypertension, benign   Diabetes   Panniculitis   OSA (obstructive sleep apnea)   Hx pulmonary embolism   S/P IVC filter-temporary REMOVED   Esophageal reflux   Polyneuropathy in diabetes(357.2)   Acute renal insufficiency   . amLODipine  5 mg Oral Daily  . ciprofloxacin  750 mg Oral BID  . docusate sodium  100 mg Oral BID  . FLUoxetine  40 mg Oral QHS  . furosemide  20 mg Oral Daily  . indapamide  5 mg Oral Daily  . insulin aspart  0-20 Units Subcutaneous TID WC  . insulin glargine  50 Units Subcutaneous QHS  . lisinopril  20 mg Oral Daily  . metFORMIN  1,000 mg Oral BID WC  . metroNIDAZOLE  500 mg Oral 3 times per day  . pantoprazole (PROTONIX) IV  40 mg Intravenous QHS  . pioglitazone  45 mg Oral Daily  . vancomycin  1,000 mg Intravenous Q12H  . warfarin  1 mg Oral ONCE-1800  . Warfarin - Pharmacist Dosing Inpatient   Does not apply q1800    OBJECTIVE: Blood pressure 157/50, pulse 79, temperature 98.2 F (36.8 C), temperature source Oral, resp. rate 18, height 6\' 1"  (1.854 m), weight 450 lb 9.6 oz (204.391 kg), SpO2 98 %.   He is currently having his PICC placed so I was not able to examine him today. His nurse reports that he is doing better.  Lab Results Lab Results  Component Value Date   WBC 7.8 01/06/2015   HGB 9.0* 01/06/2015   HCT 29.8* 01/06/2015   MCV 72.9* 01/06/2015   PLT 596* 01/06/2015    Lab Results  Component Value Date   CREATININE 1.14 01/07/2015   BUN 43* 01/05/2015   NA 138 01/05/2015   K 3.1* 01/05/2015   CL 106 01/05/2015   CO2 23 01/05/2015    Lab Results    Component Value Date   ALT 17 07/05/2008   AST 16 07/05/2008   ALKPHOS 75 07/05/2008   BILITOT 0.7 07/05/2008     Microbiology: Recent Results (from the past 240 hour(s))  Blood culture (routine x 2)     Status: None   Collection Time: 12/30/14  8:17 PM  Result Value Ref Range Status   Specimen Description BLOOD LEFT HAND  Final   Special Requests BOTTLES DRAWN AEROBIC ONLY 3 CC  Final   Culture   Final    NO GROWTH 5 DAYS Performed at [facility]    Report Status 01/06/2015 FINAL  Final  Blood culture (routine x 2)     Status: None   Collection Time: 12/30/14  8:33 PM  Result Value Ref Range Status   Specimen Description BLOOD LEFT FOREARM  Final   Special Requests BOTTLES DRAWN AEROBIC AND ANAEROBIC 4CC  Final   Culture   Final    NO GROWTH 5 DAYS Performed at [facility]  Report Status 01/06/2015 FINAL  Final  Wound culture     Status: None   Collection Time: 01/03/15  1:47 PM  Result Value Ref Range Status   Specimen Description ABDOMEN  Final   Special Requests Normal  Final   Gram Stain   Final    ABUNDANT WBC PRESENT,BOTH PMN AND MONONUCLEAR NO SQUAMOUS EPITHELIAL CELLS SEEN NO ORGANISMS SEEN Performed at [facility]    Culture   Final    NO GROWTH 2 DAYS Performed at [facility]    Report Status 01/05/2015 FINAL  Final  Clostridium Difficile by PCR     Status: None   Collection Time: 01/04/15 12:15 PM  Result Value Ref Range Status   C difficile by pcr NEGATIVE NEGATIVE Final   Assessment: Improving on therapy for abdominal wound abscess. His acute renal insufficiency has resolved.  Plan: 1. Continue IV vancomycin and oral ciprofloxacin and metronidazole for at least 2 more weeks  Michel Bickers, MD [facility] for [place] [phone] pager   [phone] cell 01/07/2015, 3:43 PM

## 2015-01-07 NOTE — Progress Notes (Signed)
ANTIBIOTIC CONSULT NOTE  Pharmacy Consult for Vancomycin Indication: wound infection  No Known Allergies  Patient Measurements: Height: 6\' 1"  (185.4 cm) Weight: (!) 450 lb 9.6 oz (204.391 kg) IBW/kg (Calculated) : 79.9  Vital Signs: Temp: 98.2 F (36.8 C) (04/12 1332) Temp Source: Oral (04/12 1332) BP: 157/50 mmHg (04/12 1332) Pulse Rate: 79 (04/12 1332) Intake/Output from previous day: 04/11 0701 - 04/12 0700 In: 1989.2 [P.O.:480; I.V.:1509.2] Out: 3350 [Urine:3350] Intake/Output from this shift: Total I/O In: 680 [P.O.:480; I.V.:200] Out: 650 [Urine:650]  Labs:  Recent Labs  01/04/15 1714 01/05/15 0500 01/05/15 1220 01/06/15 0512 01/07/15 0528  WBC  --   --  8.1 7.8  --   HGB  --   --  9.1* 9.0*  --   PLT  --   --  571* 596*  --   CREATININE 1.87* 1.62*  --   --  1.14   Estimated Creatinine Clearance: 131.2 mL/min (by C-G formula based on Cr of 1.14).  Recent Labs  01/04/15 1714 01/05/15 1220  VANCORANDOM 15.3 17.2     Microbiology: Recent Results (from the past 720 hour(s))  Blood culture (routine x 2)     Status: None   Collection Time: 12/30/14  8:17 PM  Result Value Ref Range Status   Specimen Description BLOOD LEFT HAND  Final   Special Requests BOTTLES DRAWN AEROBIC ONLY 3 CC  Final   Culture   Final    NO GROWTH 5 DAYS Performed at [facility]    Report Status 01/06/2015 FINAL  Final  Blood culture (routine x 2)     Status: None   Collection Time: 12/30/14  8:33 PM  Result Value Ref Range Status   Specimen Description BLOOD LEFT FOREARM  Final   Special Requests BOTTLES DRAWN AEROBIC AND ANAEROBIC 4CC  Final   Culture   Final    NO GROWTH 5 DAYS Performed at [facility]    Report Status 01/06/2015 FINAL  Final  Wound culture     Status: None   Collection Time: 01/03/15  1:47 PM  Result Value Ref Range Status   Specimen Description ABDOMEN  Final   Special Requests Normal  Final   Gram Stain   Final    ABUNDANT  WBC PRESENT,BOTH PMN AND MONONUCLEAR NO SQUAMOUS EPITHELIAL CELLS SEEN NO ORGANISMS SEEN Performed at [facility]    Culture   Final    NO GROWTH 2 DAYS Performed at [facility]    Report Status 01/05/2015 FINAL  Final  Clostridium Difficile by PCR     Status: None   Collection Time: 01/04/15 12:15 PM  Result Value Ref Range Status   C difficile by pcr NEGATIVE NEGATIVE Final    Anti-infectives    Start     Dose/Rate Route Frequency Ordered Stop   01/06/15 2000  ciprofloxacin (CIPRO) tablet 750 mg     750 mg Oral 2 times daily 01/06/15 1516     01/06/15 1800  metroNIDAZOLE (FLAGYL) tablet 500 mg     500 mg Oral 3 times per day 01/06/15 1516     01/05/15 1600  vancomycin (VANCOCIN) IVPB 1000 mg/200 mL premix     1,000 mg 200 mL/hr over 60 Minutes Intravenous Every 12 hours 01/05/15 1331     01/04/15 1830  vancomycin (VANCOCIN) 1,500 mg in sodium chloride 0.9 % 500 mL IVPB     1,500 mg 250 mL/hr over 120 Minutes Intravenous  Once 01/04/15 1805  01/04/15 2219   01/03/15 0600  vancomycin (VANCOCIN) 1,500 mg in sodium chloride 0.9 % 500 mL IVPB  Status:  Discontinued     1,500 mg 250 mL/hr over 120 Minutes Intravenous Every 12 hours 01/02/15 2147 01/03/15 1755   01/02/15 1545  vancomycin (VANCOCIN) 2,500 mg in sodium chloride 0.9 % 500 mL IVPB     2,500 mg 250 mL/hr over 120 Minutes Intravenous  Once 01/02/15 1542 01/02/15 1821   12/31/14 1700  piperacillin-tazobactam (ZOSYN) IVPB 3.375 g  Status:  Discontinued     3.375 g 12.5 mL/hr over 240 Minutes Intravenous Every 8 hours 12/31/14 1536 01/06/15 1516     Assessment: 7 yoM s/p panniculectomy 10/15, difficulty with wound healing, placed wound VAC 1/16, admit 4/5 for decreased VAC output, abscess and fistula noted on CT. 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   Admit 4/5 started on Zosyn, add Vancomycin per ID MD, pharmacy to dose  SCr increasing since admit  4/8 drainage of infected hematoma in RLQ w/ VAC.   MD thinks this is source of leukocytosis and fever  4/5 >> Zosyn >> 4/11 4/8 >> vancomycin >> 4/11 >>cipro >> 4/11 >>flagyl >>  Tmax: afeb since 4/8 WBCs: WNL Renal: AKI, but SCr continues to improve to 1.14, CrCl ~72 (N)  4/8 wound Cx: NGTD  Drug level / dose changes info: 4/8 1700 VT = 28.6 after 2.5g load and first dose of 1.5g q12h, not at Css yet but drawn because SCr was rising. Dose given after blood drawn. 4/9 1700 VRm = 15.3; gave 1500 mg x 1 at 8pm; SCr improved 4/10 VRm 17.2, started Vanc 1000mg  q12h  Goal of Therapy:   Vancomycin trough level 15-20 mcg/ml   Eradication of infection  Appropriate antibiotic dosing for indication and renal function  Plan:    Check vanc trough before 1600 dose today, this is before the 5th dose of 1000mg  q12h  PICC line place today to continue therapy for several more weeks per ID  Follow clinical course, renal function, culture results     Dolly Rias RPh 01/07/2015, 2:10 PM Pager [phone]

## 2015-01-07 NOTE — Progress Notes (Signed)
Peripherally Inserted Central Catheter/Midline Placement  The IV Nurse has discussed with the patient and/or persons authorized to consent for the patient, the purpose of this procedure and the potential benefits and risks involved with this procedure.  The benefits include less needle sticks, lab draws from the catheter and patient may be discharged home with the catheter.  Risks include, but not limited to, infection, bleeding, blood clot (thrombus formation), and puncture of an artery; nerve damage and irregular heat beat.  Alternatives to this procedure were also discussed.  PICC/Midline Placement Documentation        Joseph [name] 01/07/2015, 2:50 PM

## 2015-01-07 NOTE — Progress Notes (Signed)
ANTICOAGULATION CONSULT NOTE  Pharmacy Consult for coumadin Indication: hx PE/DVT  No Known Allergies  Patient Measurements: Height: 6\' 1"  (185.4 cm) Weight: (!) 450 lb 9.6 oz (204.391 kg) IBW/kg (Calculated) : 79.9  Vital Signs: Temp: 98.2 F (36.8 C) (04/12 1332) Temp Source: Oral (04/12 1332) BP: 157/50 mmHg (04/12 1332) Pulse Rate: 79 (04/12 1332)  Labs:  Recent Labs  01/04/15 1714 01/05/15 0500 01/05/15 1220 01/06/15 0512 01/07/15 0528  HGB  --   --  9.1* 9.0*  --   HCT  --   --  30.3* 29.8*  --   PLT  --   --  571* 596*  --   LABPROT  --  31.9*  --  27.9* 31.7*  INR  --  3.07*  --  2.58* 3.04*  CREATININE 1.87* 1.62*  --   --  1.14    Estimated Creatinine Clearance: 131.2 mL/min (by C-G formula based on Cr of 1.14).   Medical History: Past Medical History  Diagnosis Date  . Morbid obesity   . Pure hypercholesterolemia   . Essential hypertension, benign   . 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 Esophageal reflux   . Type II or unspecified type diabetes mellitus without mention of complication, not stated as uncontrolled   . Anxiety state, unspecified   . Essential hypertension, benign   . Varicose veins of lower extremities with inflammation   . Peripheral vascular disease, unspecified   . Edema   . Type II or unspecified type diabetes mellitus with neurological manifestations, not stated as uncontrolled   . Polyneuropathy in diabetes(357.2)   . Personal history of PE (pulmonary embolism) 2004  . Depression   . Unspecified sleep apnea     uses CPAP  . Obstructive sleep apnea (adult) (pediatric)   . DVT (deep venous thrombosis) 2004  . Shortness of breath     DUE TO WEIGHT AND LOSS OF MOBILITY  . Myalgia and myositis, unspecified     BACK AND LEGS  . Falls frequently   . Poor venous access     PT STATES DIFFICULT TO DRAW BLOOD FROM HIS VEINS   Assessment: Patient is a 59 y.o m on  coumadin PTA for hx PE/DVT.  He presented to [facility] with panniculitis and currently on abx for infection. Pharmacy is consulted to manage coumadin therapy for patient.  Home dose: 5 mg daily except 7.5 mg on Mondays.   Today, 01/07/2015:  INR 3.04, supratherapeutic.  Hgb low but stable, plt ok  Flagyl and cipro started yesterday which could increase the levels/effects of warfarin  Consuming 100% of diet   SCr 1.1   Goal of Therapy:  INR 2-3    Plan:   Warfarin 1mg  x 1 @ 1800, INR  only slightly above goal and want to avoid a rebound effect from holding doses  Daily INR; CBC at least q72 hr while on warfarin  Monitor for signs of bleeding  Dolly Rias RPh 01/07/2015, 1:49 PM Pager [phone]

## 2015-01-07 NOTE — Progress Notes (Signed)
Telephone order received from Michel Bickers MD for patient to receive single lumen picc line for prolonged IV therapy Neta Mends RN 1:16 PM 01-07-2015

## 2015-01-07 NOTE — Progress Notes (Signed)
Patient ID: Joseph [name]., male   DOB: [birth year], 59 y.o.   MRN: [id number]    Subjective: No C/O except wanting to go home  Objective: Vital signs in last 24 hours: Temp:  [97.5 F (36.4 C)-97.7 F (36.5 C)] 97.7 F (36.5 C) (04/12 0453) Pulse Rate:  [78-85] 78 (04/12 0453) Resp:  [18] 18 (04/12 0453) BP: (127-152)/(46-55) 127/55 mmHg (04/12 0453) SpO2:  [96 %-98 %] 98 % (04/12 0453) Last BM Date: 01/05/15  Intake/Output from previous day: 04/11 0701 - 04/12 0700 In: 1989.2 [P.O.:480; I.V.:1509.2] Out: 3350 [Urine:3350] Intake/Output this shift:    General appearance: alert, cooperative, no distress and up in chair Incision/Wound: VAC in place and intact.  No erythema or unusual drainage. See [place] nurse note from yesterday.  Lab Results:   Recent Labs  01/05/15 1220 01/06/15 0512  WBC 8.1 7.8  HGB 9.1* 9.0*  HCT 30.3* 29.8*  PLT 571* 596*   BMET  Recent Labs  01/04/15 1714 01/05/15 0500 01/07/15 0528  NA 137 138  --   K 3.3* 3.1*  --   CL 102 106  --   CO2 22 23  --   GLUCOSE 129* 106*  --   BUN 46* 43*  --   CREATININE 1.87* 1.62* 1.14  CALCIUM 8.1* 7.8*  --      Studies/Results: No results found.  Anti-infectives: Anti-infectives    Start     Dose/Rate Route Frequency Ordered Stop   01/06/15 2000  ciprofloxacin (CIPRO) tablet 750 mg     750 mg Oral 2 times daily 01/06/15 1516     01/06/15 1800  metroNIDAZOLE (FLAGYL) tablet 500 mg     500 mg Oral 3 times per day 01/06/15 1516     01/05/15 1600  vancomycin (VANCOCIN) IVPB 1000 mg/200 mL premix     1,000 mg 200 mL/hr over 60 Minutes Intravenous Every 12 hours 01/05/15 1331     01/04/15 1830  vancomycin (VANCOCIN) 1,500 mg in sodium chloride 0.9 % 500 mL IVPB     1,500 mg 250 mL/hr over 120 Minutes Intravenous  Once 01/04/15 1805 01/04/15 2219   01/03/15 0600  vancomycin (VANCOCIN) 1,500 mg in sodium chloride 0.9 % 500 mL IVPB  Status:  Discontinued     1,500 mg 250 mL/hr over 120 Minutes  Intravenous Every 12 hours 01/02/15 2147 01/03/15 1755   01/02/15 1545  vancomycin (VANCOCIN) 2,500 mg in sodium chloride 0.9 % 500 mL IVPB     2,500 mg 250 mL/hr over 120 Minutes Intravenous  Once 01/02/15 1542 01/02/15 1821   12/31/14 1700  piperacillin-tazobactam (ZOSYN) IVPB 3.375 g  Status:  Discontinued     3.375 g 12.5 mL/hr over 240 Minutes Intravenous Every 8 hours 12/31/14 1536 01/06/15 1516      Assessment/Plan: Improving post-wound I and D/debridement and IV antibiotics. PICC line today as creatinine has normalized. Probable home soon with VAC dressing and antibiotics    LOS: 7 days    Brittan Butterbaugh T 01/07/2015

## 2015-01-07 NOTE — Progress Notes (Signed)
Inpatient Diabetes Program Recommendations  AACE/ADA: New Consensus Statement on Inpatient Glycemic Control (2013)  Target Ranges:  Prepandial:   less than 140 mg/dL      Peak postprandial:   less than 180 mg/dL (1-2 hours)      Critically ill patients:  140 - 180 mg/dL    Results for Joseph [name], Joseph [name] (MRN [id number]) as of 01/07/2015 11:09  Ref. Range 01/06/2015 07:47 01/06/2015 11:39 01/06/2015 16:59 01/06/2015 21:59  Glucose-Capillary Latest Range: 70-99 mg/dL 162 (H) 196 (H) 205 (H) 248 (H)    Admit with: Panniculitis  History: Type 2 DM, Morbid Obesity  Home DM Meds: Lantus 50 units QHS   Novolog 0-30 units tid per SSI  Metformin 1000 mg bid  Actos 45 mg daily  Current Orders: Lantus 50 units QHS   Novolog Resistant SSI tid ac  Actos 45 mg daily     **Patient is eating 90-100% of meals.  **Takes fair amount of Novolog insulin at home.    MD- Please consider the following in-hospital insulin adjustments:  Add Novolog Meal Coverage- Novolog 6 units tid with meals to start    Will follow Wyn Quaker RN, MSN, CDE Diabetes Coordinator Inpatient Diabetes Program Team Pager: [phone] (8a-5p)

## 2015-01-08 LAB — VANCOMYCIN, TROUGH: Vancomycin Tr: 17.7 ug/mL (ref 10.0–20.0)

## 2015-01-08 LAB — PROTIME-INR
INR: 2.33 — AB (ref 0.00–1.49)
PROTHROMBIN TIME: 25.7 s — AB (ref 11.6–15.2)

## 2015-01-08 LAB — GLUCOSE, CAPILLARY
Glucose-Capillary: 85 mg/dL (ref 70–99)
Glucose-Capillary: 164 mg/dL — ABNORMAL HIGH (ref 70–99)

## 2015-01-08 MED ORDER — VANCOMYCIN HCL IN DEXTROSE 1-5 GM/200ML-% IV SOLN: 1000.0000 mg | Freq: Two times a day (BID) | INTRAVENOUS | Status: DC

## 2015-01-08 MED ORDER — HEPARIN SOD (PORK) LOCK FLUSH 100 UNIT/ML IV SOLN
250.0000 [IU] | INTRAVENOUS | Status: AC | PRN
  Administered 2015-01-08: 250 [IU]

## 2015-01-08 MED ORDER — METRONIDAZOLE 500 MG PO TABS: 500.0000 mg | ORAL_TABLET | Freq: Three times a day (TID) | ORAL | Status: DC

## 2015-01-08 MED ORDER — WARFARIN SODIUM 2.5 MG PO TABS
2.5000 mg | ORAL_TABLET | Freq: Once | ORAL | Status: DC
  Filled 2015-01-08: qty 1

## 2015-01-08 MED ORDER — HYDROCODONE-ACETAMINOPHEN 5-325 MG PO TABS: 1.0000 | ORAL_TABLET | ORAL | Status: DC | PRN

## 2015-01-08 MED ORDER — CIPROFLOXACIN HCL 750 MG PO TABS: 750.0000 mg | ORAL_TABLET | Freq: Two times a day (BID) | ORAL | Status: DC

## 2015-01-08 NOTE — Progress Notes (Signed)
Patient ID: Joseph Coffee., male   DOB: May 13, 1956, 59 y.o.   MRN: [id number]         Index for Infectious Disease    Date of Admission:  12/31/2014           Day 9 antibiotics                     Principal Problem:   Abscess of postoperative wound of abdominal wall Active Problems:   Status post panniculectomy Oct 2015   Morbid obesity BMI 65   Essential hypertension, benign   Diabetes   Panniculitis   OSA (obstructive sleep apnea)   Hx pulmonary embolism   S/P IVC filter-temporary REMOVED   Esophageal reflux   Polyneuropathy in diabetes(357.2)   Acute renal insufficiency   . amLODipine  5 mg Oral Daily  . ciprofloxacin  750 mg Oral BID  . docusate sodium  100 mg Oral BID  . FLUoxetine  40 mg Oral QHS  . furosemide  20 mg Oral Daily  . indapamide  5 mg Oral Daily  . insulin aspart  0-20 Units Subcutaneous TID WC  . insulin glargine  50 Units Subcutaneous QHS  . lisinopril  20 mg Oral Daily  . metFORMIN  1,000 mg Oral BID WC  . metroNIDAZOLE  500 mg Oral 3 times per day  . pantoprazole (PROTONIX) IV  40 mg Intravenous QHS  . pioglitazone  45 mg Oral Daily  . vancomycin  1,000 mg Intravenous Q12H  . warfarin  2.5 mg Oral ONCE-1800  . Warfarin - Pharmacist Dosing Inpatient   Does not apply q1800    SUBJECTIVE: He is feeling much better.  OBJECTIVE: Blood pressure 150/29, pulse 87, temperature 98.1 F (36.7 C), temperature source Oral, resp. rate 18, height 6\' 1"  (1.854 m), weight 450 lb 9.6 oz (204.391 kg), SpO2 98 %.   He is alert and comfortable sitting up in a chair. VAC dressings in place on abdomen  Lab Results Lab Results  Component Value Date   WBC 7.8 01/06/2015   HGB 9.0* 01/06/2015   HCT 29.8* 01/06/2015   MCV 72.9* 01/06/2015   PLT 596* 01/06/2015    Lab Results  Component Value Date   CREATININE 1.14 01/07/2015   BUN 43* 01/05/2015   NA 138 01/05/2015   K 3.1* 01/05/2015   CL 106 01/05/2015   CO2 23 01/05/2015    Lab Results    Component Value Date   ALT 17 07/05/2008   AST 16 07/05/2008   ALKPHOS 75 07/05/2008   BILITOT 0.7 07/05/2008     Microbiology: Recent Results (from the past 240 hour(s))  Blood culture (routine x 2)     Status: None   Collection Time: 12/30/14  8:17 PM  Result Value Ref Range Status   Specimen Description BLOOD LEFT HAND  Final   Special Requests BOTTLES DRAWN AEROBIC ONLY 3 CC  Final   Culture   Final    NO GROWTH 5 DAYS Performed at [facility]    Report Status 01/06/2015 FINAL  Final  Blood culture (routine x 2)     Status: None   Collection Time: 12/30/14  8:33 PM  Result Value Ref Range Status   Specimen Description BLOOD LEFT FOREARM  Final   Special Requests BOTTLES DRAWN AEROBIC AND ANAEROBIC 4CC  Final   Culture   Final    NO GROWTH 5 DAYS Performed at [facility]  Report Status 01/06/2015 FINAL  Final  Wound culture     Status: None   Collection Time: 01/03/15  1:47 PM  Result Value Ref Range Status   Specimen Description ABDOMEN  Final   Special Requests Normal  Final   Gram Stain   Final    ABUNDANT WBC PRESENT,BOTH PMN AND MONONUCLEAR NO SQUAMOUS EPITHELIAL CELLS SEEN NO ORGANISMS SEEN Performed at [facility]    Culture   Final    NO GROWTH 2 DAYS Performed at [facility]    Report Status 01/05/2015 FINAL  Final  Clostridium Difficile by PCR     Status: None   Collection Time: 01/04/15 12:15 PM  Result Value Ref Range Status   C difficile by pcr NEGATIVE NEGATIVE Final   Assessment: He is improving on broad empiric antibiotic therapy for his abdominal wound abscess. I will plan on at least 2 more weeks of therapy.  Plan: 1. Continue IV vancomycin and oral ciprofloxacin and metronidazole for at least 2 more weeks 2. I will arrange follow-up in my clinic  Michel Bickers, [facility] [phone] pager   [phone] cell 01/08/2015, 2:16 PM

## 2015-01-08 NOTE — Progress Notes (Signed)
ANTICOAGULATION CONSULT NOTE  Pharmacy Consult for coumadin Indication: hx PE/DVT  No Known Allergies  Patient Measurements: Height: 6\' 1"  (185.4 cm) Weight: (!) 450 lb 9.6 oz (204.391 kg) IBW/kg (Calculated) : 79.9  Vital Signs: Temp: 97.7 F (36.5 C) (04/13 0509) Temp Source: Oral (04/13 0509) BP: 154/46 mmHg (04/13 0509) Pulse Rate: 79 (04/13 0509)  Labs:  Recent Labs  01/05/15 1220 01/06/15 0512 01/07/15 0528 01/08/15 0300  HGB 9.1* 9.0*  --   --   HCT 30.3* 29.8*  --   --   PLT 571* 596*  --   --   LABPROT  --  27.9* 31.7* 25.7*  INR  --  2.58* 3.04* 2.33*  CREATININE  --   --  1.14  --     Estimated Creatinine Clearance: 131.2 mL/min (by C-G formula based on Cr of 1.14).   Medical History: Past Medical History  Diagnosis Date  . Morbid obesity   . Pure hypercholesterolemia   . Essential hypertension, benign   . 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 Esophageal reflux   . Type II or unspecified type diabetes mellitus without mention of complication, not stated as uncontrolled   . Anxiety state, unspecified   . Essential hypertension, benign   . Varicose veins of lower extremities with inflammation   . Peripheral vascular disease, unspecified   . Edema   . Type II or unspecified type diabetes mellitus with neurological manifestations, not stated as uncontrolled   . Polyneuropathy in diabetes(357.2)   . Personal history of PE (pulmonary embolism) 2004  . Depression   . Unspecified sleep apnea     uses CPAP  . Obstructive sleep apnea (adult) (pediatric)   . DVT (deep venous thrombosis) 2004  . Shortness of breath     DUE TO WEIGHT AND LOSS OF MOBILITY  . Myalgia and myositis, unspecified     BACK AND LEGS  . Falls frequently   . Poor venous access     PT STATES DIFFICULT TO DRAW BLOOD FROM HIS VEINS   Assessment: Patient is a 59 y.o m on coumadin PTA for hx PE/DVT.  He presented to [facility] with  panniculitis and currently on abx for infection. Pharmacy is consulted to manage coumadin therapy for patient.  Home dose: 5 mg daily except 7.5 mg on Mondays.   Today, 01/08/2015:  INR 2.33, therapeutic.  Hgb low but stable, plt ok  DDI: Flagyl and cipro, can increase the levels/effects of warfarin  Consuming 100% of diet   SCr 1.1   Goal of Therapy:  INR 2-3    Plan:   Warfarin 2.5mg  x 1 @ 1800  Daily INR; CBC at least q72 hr while on warfarin  Monitor for signs of bleeding  Dolly Rias RPh 01/08/2015, 8:26 AM Pager [phone]

## 2015-01-08 NOTE — Discharge Summary (Signed)
Physician Discharge Summary  Patient ID: Joseph [name]. MRN: [id number] DOB/AGE: 1956-01-12 59 y.o.  Admit date: 12/31/2014 Discharge date: 01/08/2015  Admission Diagnoses:  Infected panniculus  Discharge Diagnoses:  Abscess in panniculus  Principal Problem:   Abscess of postoperative wound of abdominal wall Active Problems:   Panniculitis   Morbid obesity BMI 65   Essential hypertension, benign   Diabetes   OSA (obstructive sleep apnea)   Hx pulmonary embolism   Status post panniculectomy Oct 2015   S/P IVC filter-temporary REMOVED   Esophageal reflux   Polyneuropathy in diabetes(357.2)   Acute renal insufficiency   Surgery:  Drainage of abscess at the bedside  Discharged Condition: improved  Hospital Course:   Admitted and begun on IV antibiotics.  WBC continued to rise despite IV antibiotics.  Soft area developed last Friday that was opened and drained at the bedside.  It appeared to be an infected hematoma of the pannus.  VAC applied and WBC dropped.    Consults: Seen by Dr. Megan Salon in ID who recommended 2 weeks IV Vancocin and 2 weeks PO Cipro Flagyl.    Significant Diagnostic Studies: CT scan    Discharge Exam: Blood pressure 154/46, pulse 79, temperature 97.7 F (36.5 C), temperature source Oral, resp. rate 20, height 6\' 1"  (1.854 m), weight 204.391 kg (450 lb 9.6 oz), SpO2 100 %. VACs in place on the right and the left of the pannus.  Two separate sponges in two sep parate tracts  Disposition: 01-Home or Self Care  Discharge Instructions    Diet - low sodium heart healthy    Complete by:  As directed      Discharge instructions    Complete by:  As directed   Need home health care for dual wound VAC     Home Health    Complete by:  As directed   To provide the following care/treatments:  RN  Dual wound VACs for every other day assistance and changing prn     Increase activity slowly    Complete by:  As directed             Medication List    STOP  taking these medications        amoxicillin-clavulanate 875-125 MG per tablet  Commonly known as:  AUGMENTIN      TAKE these medications        acetaminophen 500 MG tablet  Commonly known as:  TYLENOL  Take 1,000 mg by mouth every 6 (six) hours as needed for moderate pain or headache.     amLODipine 5 MG tablet  Commonly known as:  NORVASC  Take 5 mg by mouth daily.     BENEFIBER Powd  Take 15 mLs by mouth daily. Mix in coffee and drink     ciprofloxacin 750 MG tablet  Commonly known as:  CIPRO  Take 1 tablet (750 mg total) by mouth 2 (two) times daily.     ferrous sulfate 325 (65 FE) MG tablet  Take 325 mg by mouth daily with breakfast.     FLUoxetine 40 MG capsule  Commonly known as:  PROZAC  Take 40 mg by mouth at bedtime.     furosemide 20 MG tablet  Commonly known as:  LASIX  Take 20 mg by mouth daily.     HYDROcodone-acetaminophen 5-325 MG per tablet  Commonly known as:  NORCO  Take 1 tablet by mouth every 4 (four) hours as needed for moderate pain.  indapamide 2.5 MG tablet  Commonly known as:  LOZOL  Take 5 mg by mouth daily.     insulin aspart 100 UNIT/ML injection  Commonly known as:  novoLOG  Inject 0-30 Units into the skin 3 (three) times daily as needed for high blood sugar (CBG >150). Per sliding scale     insulin glargine 100 UNIT/ML injection  Commonly known as:  LANTUS  Inject 50 Units into the skin at bedtime.     metFORMIN 1000 MG tablet  Commonly known as:  GLUCOPHAGE  Take 1,000 mg by mouth 2 (two) times daily with a meal.     metroNIDAZOLE 500 MG tablet  Commonly known as:  FLAGYL  Take 1 tablet (500 mg total) by mouth every 8 (eight) hours.     omeprazole 40 MG capsule  Commonly known as:  PRILOSEC  Take 40 mg by mouth daily.     pioglitazone 45 MG tablet  Commonly known as:  ACTOS  Take 45 mg by mouth daily.     pravastatin 40 MG tablet  Commonly known as:  PRAVACHOL  Take 40 mg by mouth at bedtime.     PRENATAL FA PO   Take 1 tablet by mouth daily.     quinapril 20 MG tablet  Commonly known as:  ACCUPRIL  Take 20 mg by mouth daily.     silver sulfADIAZINE 1 % cream  Commonly known as:  SILVADENE  Apply 1 application topically 2 (two) times daily as needed (wound care).     vancomycin 1 GM/200ML Soln  Commonly known as:  VANCOCIN  Inject 200 mLs (1,000 mg total) into the vein every 12 (twelve) hours.  Start taking on:  01/22/2015     warfarin 5 MG tablet  Commonly known as:  COUMADIN  - Take 5-7.5 mg by mouth at bedtime. Take 5mg  on Tuesday, Wednesday, Thursday, Friday, Saturday, and Sunday  - Take 7.5mg  on Mondays           Follow-up Information    Follow up with Pedro Earls, MD.   Specialty:  General Surgery   Contact information:   [place] [address] [phone]       Signed: Pedro Earls 01/08/2015, 11:00 AM

## 2015-01-08 NOTE — Progress Notes (Signed)
ANTIBIOTIC CONSULT NOTE  Pharmacy Consult for Vancomycin Indication: wound infection  No Known Allergies  Patient Measurements: Height: 6\' 1"  (185.4 cm) Weight: (!) 450 lb 9.6 oz (204.391 kg) IBW/kg (Calculated) : 79.9  Vital Signs: Temp: 97.7 F (36.5 C) (04/13 0509) Temp Source: Oral (04/13 0509) BP: 154/46 mmHg (04/13 0509) Pulse Rate: 79 (04/13 0509) Intake/Output from previous day: 04/12 0701 - 04/13 0700 In: 1678.3 [P.O.:480; I.V.:1198.3] Out: 3300 [Urine:3300] Intake/Output from this shift: Total I/O In: -  Out: 750 [Urine:750]  Labs:  Recent Labs  01/05/15 1220 01/06/15 0512 01/07/15 0528  WBC 8.1 7.8  --   HGB 9.1* 9.0*  --   PLT 571* 596*  --   CREATININE  --   --  1.14   Estimated Creatinine Clearance: 131.2 mL/min (by C-G formula based on Cr of 1.14).  Recent Labs  01/05/15 1220 01/08/15 0300  VANCOTROUGH  --  17.7  VANCORANDOM 17.2  --      Microbiology: Recent Results (from the past 720 hour(s))  Blood culture (routine x 2)     Status: None   Collection Time: 12/30/14  8:17 PM  Result Value Ref Range Status   Specimen Description BLOOD LEFT HAND  Final   Special Requests BOTTLES DRAWN AEROBIC ONLY 3 CC  Final   Culture   Final    NO GROWTH 5 DAYS Performed at [facility]    Report Status 01/06/2015 FINAL  Final  Blood culture (routine x 2)     Status: None   Collection Time: 12/30/14  8:33 PM  Result Value Ref Range Status   Specimen Description BLOOD LEFT FOREARM  Final   Special Requests BOTTLES DRAWN AEROBIC AND ANAEROBIC 4CC  Final   Culture   Final    NO GROWTH 5 DAYS Performed at [facility]    Report Status 01/06/2015 FINAL  Final  Wound culture     Status: None   Collection Time: 01/03/15  1:47 PM  Result Value Ref Range Status   Specimen Description ABDOMEN  Final   Special Requests Normal  Final   Gram Stain   Final    ABUNDANT WBC PRESENT,BOTH PMN AND MONONUCLEAR NO SQUAMOUS EPITHELIAL CELLS  SEEN NO ORGANISMS SEEN Performed at [facility]    Culture   Final    NO GROWTH 2 DAYS Performed at [facility]    Report Status 01/05/2015 FINAL  Final  Clostridium Difficile by PCR     Status: None   Collection Time: 01/04/15 12:15 PM  Result Value Ref Range Status   C difficile by pcr NEGATIVE NEGATIVE Final    Anti-infectives    Start     Dose/Rate Route Frequency Ordered Stop   01/22/15 0000  vancomycin (VANCOCIN) 1 GM/200ML SOLN     1,000 mg 200 mL/hr over 60 Minutes Intravenous Every 12 hours 01/08/15 0930     01/08/15 0000  ciprofloxacin (CIPRO) 750 MG tablet     750 mg Oral 2 times daily 01/08/15 0930     01/08/15 0000  metroNIDAZOLE (FLAGYL) 500 MG tablet     500 mg Oral Every 8 hours 01/08/15 0930     01/06/15 2000  ciprofloxacin (CIPRO) tablet 750 mg     750 mg Oral 2 times daily 01/06/15 1516     01/06/15 1800  metroNIDAZOLE (FLAGYL) tablet 500 mg     500 mg Oral 3 times per day 01/06/15 1516     01/05/15 1600  vancomycin (VANCOCIN) IVPB 1000 mg/200 mL premix     1,000 mg 200 mL/hr over 60 Minutes Intravenous Every 12 hours 01/05/15 1331     01/04/15 1830  vancomycin (VANCOCIN) 1,500 mg in sodium chloride 0.9 % 500 mL IVPB     1,500 mg 250 mL/hr over 120 Minutes Intravenous  Once 01/04/15 1805 01/04/15 2219   01/03/15 0600  vancomycin (VANCOCIN) 1,500 mg in sodium chloride 0.9 % 500 mL IVPB  Status:  Discontinued     1,500 mg 250 mL/hr over 120 Minutes Intravenous Every 12 hours 01/02/15 2147 01/03/15 1755   01/02/15 1545  vancomycin (VANCOCIN) 2,500 mg in sodium chloride 0.9 % 500 mL IVPB     2,500 mg 250 mL/hr over 120 Minutes Intravenous  Once 01/02/15 1542 01/02/15 1821   12/31/14 1700  piperacillin-tazobactam (ZOSYN) IVPB 3.375 g  Status:  Discontinued     3.375 g 12.5 mL/hr over 240 Minutes Intravenous Every 8 hours 12/31/14 1536 01/06/15 1516     Assessment: 62 yoM s/p panniculectomy 10/15, difficulty with wound healing, placed wound  VAC 1/16, admit 4/5 for decreased VAC output, abscess and fistula noted on CT. 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   Admit 4/5 started on Zosyn, add Vancomycin per ID MD, pharmacy to dose  SCr increasing since admit  4/8 drainage of infected hematoma in RLQ w/ VAC.  MD thinks this is source of leukocytosis and fever  4/5 >> Zosyn >> 4/11 4/8 >> vancomycin >> 4/11 >>cipro >> 4/11 >>flagyl >>  Tmax: afeb since 4/8 WBCs: WNL Renal: AKI, but SCr continues to improve to 1.14, CrCl ~72 (N)  4/8 wound Cx: NGTD  Drug level / dose changes info: 4/8 1700 VT = 28.6 after 2.5g load and first dose of 1.5g q12h, not at Css yet but drawn because SCr was rising. Dose given after blood drawn. 4/9 1700 VRm = 15.3; gave 1500 mg x 1 at 8pm; SCr improved 4/10 VRm 17.2, started Vanc 1000mg  q12h 4/13 VT=17.7, no change  Goal of Therapy:   Vancomycin trough level 15-20 mcg/ml   Eradication of infection  Appropriate antibiotic dosing for indication and renal function  Plan:    Continue Vancomycin 1Gm IV q12h  PICC line to continue therapy for several more weeks per ID  Recommend checking Scr twice weekly and vanc trough at least once a week or more if clinically indicated.     Dolly Rias RPh 01/08/2015, 9:50 AM Pager [phone]

## 2015-01-08 NOTE — Care Management Note (Addendum)
    Page 1 of 2   01/08/2015     12:25:16 PM CARE MANAGEMENT NOTE 01/08/2015  Patient:  Joseph [name], Joseph [name]   Account Number:  [id number]  Date Initiated:  01/08/2015  Documentation initiated by:  Joseph [name]  Subjective/Objective Assessment:   59 yo male admitted with abd wall cellulitis. PTA lived at home with spouse. Active with [facility] for wound vac.     Action/Plan:   Home when stable   Anticipated DC Date:  01/08/2015   Anticipated DC Plan:  [place]  CM consult      [facility] Choice  Resumption Of Svcs/PTA Provider   Choice offered to / List presented to:     DME arranged  VAC      DME agency  [facility]     HH arranged  HH-1 RN  HH-10 DISEASE MANAGEMENT  IV Antibiotics      [place].   Status of service:  Completed, signed off Medicare Important Message given?   (If response is "NO", the following Medicare IM given date fields will be blank) Date Medicare IM given:   Medicare IM given by:   Date Additional Medicare IM given:   Additional Medicare IM given by:    Discharge Disposition:  [place]  Per UR Regulation:  Reviewed for med. necessity/level of care/duration of stay  If discussed at [place] of Stay Meetings, dates discussed:    Comments:  01-08-15 Joseph Spillers RN [place], supplies to be shipped to patient's home.  Patient active with [facility] for wound care, vac care. Patient now will need IV abx, has second vac site. Contacted [facility] to update on new site and equipment needs. Faxed information to them, awaiting call back on approval of equipment. [facility] aware and following.

## 2015-01-08 NOTE — Progress Notes (Signed)
ANTIBIOTIC CONSULT NOTE  Pharmacy Consult for Vancomycin Indication: wound infection  No Known Allergies  Patient Measurements: Height: 6\' 1"  (185.4 cm) Weight: (!) 450 lb 9.6 oz (204.391 kg) IBW/kg (Calculated) : 79.9  Vital Signs: Temp: 97.4 F (36.3 C) (04/12 2108) Temp Source: Axillary (04/12 2108) BP: 154/48 mmHg (04/12 2108) Pulse Rate: 78 (04/12 2108) Intake/Output from previous day: 04/12 0701 - 04/13 0700 In: 1278.3 [P.O.:480; I.V.:798.3] Out: 2950 [Urine:2950] Intake/Output from this shift: Total I/O In: 398.3 [I.V.:398.3] Out: 1550 [Urine:1550]  Labs:  Recent Labs  01/05/15 0500 01/05/15 1220 01/06/15 0512 01/07/15 0528  WBC  --  8.1 7.8  --   HGB  --  9.1* 9.0*  --   PLT  --  571* 596*  --   CREATININE 1.62*  --   --  1.14   Estimated Creatinine Clearance: 131.2 mL/min (by C-G formula based on Cr of 1.14).  Recent Labs  01/05/15 1220 01/08/15 0300  VANCOTROUGH  --  17.7  VANCORANDOM 17.2  --      Microbiology: Recent Results (from the past 720 hour(s))  Blood culture (routine x 2)     Status: None   Collection Time: 12/30/14  8:17 PM  Result Value Ref Range Status   Specimen Description BLOOD LEFT HAND  Final   Special Requests BOTTLES DRAWN AEROBIC ONLY 3 CC  Final   Culture   Final    NO GROWTH 5 DAYS Performed at [facility]    Report Status 01/06/2015 FINAL  Final  Blood culture (routine x 2)     Status: None   Collection Time: 12/30/14  8:33 PM  Result Value Ref Range Status   Specimen Description BLOOD LEFT FOREARM  Final   Special Requests BOTTLES DRAWN AEROBIC AND ANAEROBIC 4CC  Final   Culture   Final    NO GROWTH 5 DAYS Performed at [facility]    Report Status 01/06/2015 FINAL  Final  Wound culture     Status: None   Collection Time: 01/03/15  1:47 PM  Result Value Ref Range Status   Specimen Description ABDOMEN  Final   Special Requests Normal  Final   Gram Stain   Final    ABUNDANT WBC  PRESENT,BOTH PMN AND MONONUCLEAR NO SQUAMOUS EPITHELIAL CELLS SEEN NO ORGANISMS SEEN Performed at [facility]    Culture   Final    NO GROWTH 2 DAYS Performed at [facility]    Report Status 01/05/2015 FINAL  Final  Clostridium Difficile by PCR     Status: None   Collection Time: 01/04/15 12:15 PM  Result Value Ref Range Status   C difficile by pcr NEGATIVE NEGATIVE Final    Anti-infectives    Start     Dose/Rate Route Frequency Ordered Stop   01/06/15 2000  ciprofloxacin (CIPRO) tablet 750 mg     750 mg Oral 2 times daily 01/06/15 1516     01/06/15 1800  metroNIDAZOLE (FLAGYL) tablet 500 mg     500 mg Oral 3 times per day 01/06/15 1516     01/05/15 1600  vancomycin (VANCOCIN) IVPB 1000 mg/200 mL premix     1,000 mg 200 mL/hr over 60 Minutes Intravenous Every 12 hours 01/05/15 1331     01/04/15 1830  vancomycin (VANCOCIN) 1,500 mg in sodium chloride 0.9 % 500 mL IVPB     1,500 mg 250 mL/hr over 120 Minutes Intravenous  Once 01/04/15 1805 01/04/15 2219   01/03/15  0600  vancomycin (VANCOCIN) 1,500 mg in sodium chloride 0.9 % 500 mL IVPB  Status:  Discontinued     1,500 mg 250 mL/hr over 120 Minutes Intravenous Every 12 hours 01/02/15 2147 01/03/15 1755   01/02/15 1545  vancomycin (VANCOCIN) 2,500 mg in sodium chloride 0.9 % 500 mL IVPB     2,500 mg 250 mL/hr over 120 Minutes Intravenous  Once 01/02/15 1542 01/02/15 1821   12/31/14 1700  piperacillin-tazobactam (ZOSYN) IVPB 3.375 g  Status:  Discontinued     3.375 g 12.5 mL/hr over 240 Minutes Intravenous Every 8 hours 12/31/14 1536 01/06/15 1516     Assessment: 72 yoM s/p panniculectomy 10/15, difficulty with wound healing, placed wound VAC 1/16, admit 4/5 for decreased VAC output, abscess and fistula noted on CT. 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   Admit 4/5 started on Zosyn, add Vancomycin per ID MD, pharmacy to dose  SCr increasing since admit  4/8 drainage of infected hematoma in RLQ w/ VAC.  MD  thinks this is source of leukocytosis and fever  4/5 >> Zosyn >> 4/11 4/8 >> vancomycin >> 4/11 >>cipro >> 4/11 >>flagyl >>  Tmax: afeb since 4/8 WBCs: WNL Renal: AKI, but SCr continues to improve to 1.14, CrCl ~72 (N)  4/8 wound Cx: NGTD  Drug level / dose changes info: 4/8 1700 VT = 28.6 after 2.5g load and first dose of 1.5g q12h, not at Css yet but drawn because SCr was rising. Dose given after blood drawn. 4/9 1700 VRm = 15.3; gave 1500 mg x 1 at 8pm; SCr improved 4/10 VRm 17.2, started Vanc 1000mg  q12h 4/13 VT=17.7, no change  Goal of Therapy:   Vancomycin trough level 15-20 mcg/ml   Eradication of infection  Appropriate antibiotic dosing for indication and renal function  Plan:    Continue Vancomycin 1Gm IV q12h  PICC line to continue therapy for several more weeks per ID  Follow clinical course, renal function, culture results     Dorrene German 01/08/2015, 4:40 AM

## 2015-01-08 NOTE — Progress Notes (Signed)
Inpatient Diabetes Program Recommendations  AACE/ADA: New Consensus Statement on Inpatient Glycemic Control (2013)  Target Ranges:  Prepandial:   less than 140 mg/dL      Peak postprandial:   less than 180 mg/dL (1-2 hours)      Critically ill patients:  140 - 180 mg/dL    Results for Joseph [name], Joseph [name] (MRN [id number]) as of 01/08/2015 10:27  Ref. Range 01/07/2015 08:28 01/07/2015 11:58 01/07/2015 16:56 01/07/2015 21:35  Glucose-Capillary Latest Ref Range: 70-99 mg/dL 231 (H) 143 (H) 187 (H) 237 (H)    Results for Joseph [name], Joseph [name] (MRN [id number]) as of 01/08/2015 10:27  Ref. Range 01/08/2015 07:46  Glucose-Capillary Latest Ref Range: 70-99 mg/dL 85    Admit with: Panniculitis  History: Type 2 DM, Morbid Obesity  Home DM Meds: Lantus 50 units QHS   Novolog 0-30 units tid per SSI  Metformin 1000 mg bid  Actos 45 mg daily  Current Orders: Lantus 50 units QHS   Novolog Resistant SSI tid ac  Actos 45 mg daily      Metformin 1000 mg bid     **Patient is eating 90-100% of meals.  **Takes fair amount of Novolog insulin at home.  **Note Metformin 1000 mg bid added back to regimen yesterday AM.  **AM CBG today much improved- 85 mg/dl.  **No insulin recommendations today.    Will follow Wyn Quaker RN, MSN, CDE Diabetes Coordinator Inpatient Diabetes Program Team Pager: [phone] (8a-5p)

## 2015-01-08 NOTE — Discharge Instructions (Signed)
Home health care to manage daily IV Vancocin and wound VAC changes.

## 2015-01-08 NOTE — Consult Note (Signed)
WOC wound consult note Reason for Consult: NPWT (VAC) dressing change.  To be discharged today.  2 Trac Pads connected with Y connector and attached to home unit.   Wound type: Nonhealing surgical wounds to abdomen, right flank and mid abdomen.  Wound bed: 100% ruddy red.   Drainage (amount, consistency, odor) Purulence from right distal wound.  No odor.   Periwound: Intact.  Denuded skin noted last visit has resolved.  Drape applied to periwound and bridging technique used.  Dressing procedure/placement/frequency: Wounds cleansed with NS and filled with black or white foam, per order.  Suction at 125 mmHg achieved immediately with Y connector.  Attached to home unit in preparation for discharge.   Wife at bedside and observed dressing change and Y connector technique.   Will not follow at this time.  Please re-consult if needed.  Domenic Moras RN BSN [place] Pager [phone]

## 2015-01-22 ENCOUNTER — Encounter: Payer: Self-pay | Admitting: Internal Medicine

## 2015-01-22 ENCOUNTER — Ambulatory Visit (INDEPENDENT_AMBULATORY_CARE_PROVIDER_SITE_OTHER): Payer: PPO | Admitting: Internal Medicine

## 2015-01-22 VITALS — BP 166/61 | HR 101 | Temp 97.6°F | Ht 73.0 in | Wt >= 6400 oz

## 2015-01-22 DIAGNOSIS — L02211 Cutaneous abscess of abdominal wall: Secondary | ICD-10-CM

## 2015-01-22 NOTE — Progress Notes (Signed)
Patient ID: Joseph Correa., male   DOB: [birth year], 59 y.o.   MRN: [id number]         [facility]  Patient Active Problem List   Diagnosis Date Noted  . Abscess of postoperative wound of abdominal wall 01/02/2015    Priority: High  . Status post panniculectomy Oct 2015 07/17/2014    Priority: High  . Acute renal insufficiency 01/03/2015  . Esophageal reflux 01/02/2015  . Polyneuropathy in diabetes(357.2) 01/02/2015  . Cellulitis and abscess of trunk 12/31/2014  . S/P IVC filter-temporary REMOVED   . Morbid obesity BMI 65 03/22/2014  . Pure hypercholesterolemia 03/22/2014  . Essential hypertension, benign 03/22/2014  . Diabetes 03/22/2014  . Panniculitis 03/22/2014  . OSA (obstructive sleep apnea) 03/22/2014  . Hx pulmonary embolism 03/22/2014    Patient's Medications  New Prescriptions   No medications on file  Previous Medications   ACETAMINOPHEN (TYLENOL) 500 MG TABLET    Take 1,000 mg by mouth every 6 (six) hours as needed for moderate pain or headache.   AMLODIPINE (NORVASC) 5 MG TABLET    Take 5 mg by mouth daily.    CIPROFLOXACIN (CIPRO) 750 MG TABLET    Take 1 tablet (750 mg total) by mouth 2 (two) times daily.   FERROUS SULFATE 325 (65 FE) MG TABLET    Take 325 mg by mouth daily with breakfast.   FLUOXETINE (PROZAC) 40 MG CAPSULE    Take 40 mg by mouth at bedtime.    FUROSEMIDE (LASIX) 20 MG TABLET    Take 20 mg by mouth daily.    HYDROCODONE-ACETAMINOPHEN (NORCO) 5-325 MG PER TABLET    Take 1 tablet by mouth every 4 (four) hours as needed for moderate pain.   INDAPAMIDE (LOZOL) 2.5 MG TABLET    Take 5 mg by mouth daily.    INSULIN ASPART (NOVOLOG) 100 UNIT/ML INJECTION    Inject 0-30 Units into the skin 3 (three) times daily as needed for high blood sugar (CBG >150). Per sliding scale   INSULIN GLARGINE (LANTUS) 100 UNIT/ML INJECTION    Inject 50 Units into the skin at bedtime.    METFORMIN (GLUCOPHAGE) 1000 MG TABLET    Take 1,000 mg by  mouth 2 (two) times daily with a meal.   METRONIDAZOLE (FLAGYL) 500 MG TABLET    Take 1 tablet (500 mg total) by mouth every 8 (eight) hours.   OMEPRAZOLE (PRILOSEC) 40 MG CAPSULE    Take 40 mg by mouth daily.   PIOGLITAZONE (ACTOS) 45 MG TABLET    Take 45 mg by mouth daily.   PRAVASTATIN (PRAVACHOL) 40 MG TABLET    Take 40 mg by mouth at bedtime.    PRENATAL VIT-FE FUMARATE-FA (PRENATAL FA PO)    Take 1 tablet by mouth daily.   QUINAPRIL (ACCUPRIL) 20 MG TABLET    Take 20 mg by mouth daily.    SILVER SULFADIAZINE (SILVADENE) 1 % CREAM    Apply 1 application topically 2 (two) times daily as needed (wound care).   VANCOMYCIN (VANCOCIN) 1 GM/200ML SOLN    Inject 200 mLs (1,000 mg total) into the vein every 12 (twelve) hours.   WARFARIN (COUMADIN) 5 MG TABLET    Take 5-7.5 mg by mouth at bedtime. Take 5mg  on Tuesday, Wednesday, Thursday, Friday, Saturday, and Sunday Take 7.5mg  on Mondays   WHEAT DEXTRIN (BENEFIBER) POWD    Take 15 mLs by mouth daily. Mix in coffee and drink  Modified Medications  No medications on file  Discontinued Medications   No medications on file    Subjective: Joseph [name] is in with his wife for his hospital follow-up visit. He underwent abdominal panniculectomy last October and had difficulty with wound healing. He had a VAC dressing placed on a lower abdominal wound in January. He noted a sudden decrease in fluid output from the dressing late last month and then developed high fevers and leukocytosis leading to admission to the hospital. CT scan revealed an abscess pocket in the abdominal wall wound on the right side. That area was opened and a new VAC dressing placed there as well. Abscess and blood cultures were negative. He finally defervesced on IV vancomycin and piperacillin tazobactam. He was discharged home on IV vancomycin and oral ciprofloxacin and metronidazole. He is now completed 27 days of therapy. He has not had any further fevers and his white blood cell count  has normalized. He still feels very fatigued but is having more good days than bad. He continues to have some dark serous output from the VAC drains but this has been diminishing steadily since he went home. He estimates that he has about 200 mL output every 3-4 days now. He has not had any significant difficulty with his PICC line or tolerating his antibiotics.  Review of Systems: Pertinent items are noted in HPI.  Past Medical History  Diagnosis Date  . Morbid obesity   . Pure hypercholesterolemia   . Essential hypertension, benign   . 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 Esophageal reflux   . Type II or unspecified type diabetes mellitus without mention of complication, not stated as uncontrolled   . Anxiety state, unspecified   . Essential hypertension, benign   . Varicose veins of lower extremities with inflammation   . Peripheral vascular disease, unspecified   . Edema   . Type II or unspecified type diabetes mellitus with neurological manifestations, not stated as uncontrolled   . Polyneuropathy in diabetes(357.2)   . Personal history of PE (pulmonary embolism) 2004  . Depression   . Unspecified sleep apnea     uses CPAP  . Obstructive sleep apnea (adult) (pediatric)   . DVT (deep venous thrombosis) 2004  . Shortness of breath     DUE TO WEIGHT AND LOSS OF MOBILITY  . Myalgia and myositis, unspecified     BACK AND LEGS  . Falls frequently   . Poor venous access     PT STATES DIFFICULT TO DRAW BLOOD FROM HIS VEINS    History  Substance Use Topics  . Smoking status: Never Smoker   . Smokeless tobacco: Never Used  . Alcohol Use: No    No family history on file.  No Known Allergies  Objective: Temp: 97.6 F (36.4 C) (04/27 0914) Temp Source: Oral (04/27 0914) BP: 166/61 mmHg (04/27 0914) Pulse Rate: 101 (04/27 0914)  General: He appears somewhat fatigued but otherwise in no distress Skin: Right arm  PICC site appears normal Abdomen: Obese, soft and nontender. He has a VAC dressing on his right abdomen and lower midline abdomen  White blood cell count 01/13/2015: 7700  Creatinine 01/13/2015: 0.89     Assessment: He is improving on therapy for abdominal wall abscess. I suspect that his infection has been cured. He is due to see his general surgeon, Dr. Johnathan Hausen, tomorrow morning. If he is in agreement I will plan on stopping antibiotics tomorrow and having  a PICC removed.  Plan: 1. Stop antibiotics and remove PICC tomorrow if Dr. Hassell Done is in agreement 2. Follow-up in 6 weeks   Joseph Bickers, MD [facility] [place] [phone] pager   [phone] cell 01/22/2015, 9:37 AM

## 2015-01-24 ENCOUNTER — Telehealth: Payer: Self-pay | Admitting: *Deleted

## 2015-01-24 ENCOUNTER — Telehealth: Payer: Self-pay | Admitting: Internal Medicine

## 2015-01-24 NOTE — Telephone Encounter (Signed)
I spoke with Ms. Melling today by phone. She told me that Dr. Hassell Done agreed with stopping IV vancomycin and removed history Cosper's PICC line yesterday. He will stop his oral ciprofloxacin and metronidazole as well.

## 2015-01-24 NOTE — Telephone Encounter (Signed)
Per Dr. Megan Salon tell [facility] to stop Mr. Joseph [name]'s IV vancomycin. Dr. Kaylyn Lim removed his PICC yesterday.   [facility] read the order back.

## 2015-03-05 ENCOUNTER — Ambulatory Visit: Payer: PPO | Admitting: Internal Medicine

## 2015-03-09 ENCOUNTER — Telehealth: Payer: Self-pay | Admitting: Surgery

## 2015-03-09 NOTE — Telephone Encounter (Signed)
Joseph [name]  02-03-1956 [id number]  Patient Care Team: Donald Prose, MD as PCP - General (Family Medicine)  This patient is a 59 y.o.male who calls today for surgical evaluation.   Date of procedure/visit: 07/15/2014  Surgery: Surgeon:Matt Hassell Done, MD, FACS  Asst:Ben Excell Seltzer, MD, FACS  Anes:General 4 hours  Procedure:Panniculectomy 27.5 lbs  Diagnosis:Severe brawny edematous panniculitis   Reason for call: Redness  Morbidly obese male status post panniculectomy in October.  He developed cellulitis and abscess in March.  Grew MRSA.  Required IV vancomycin.  Followed by infectious disease.  Eventually healed up.  Patient is wife noted some increasing redness over the past few days.  Fever.  Concerned. Denies any drainage.  While has a low-grade fever feels better than now. History of pulmonary embolism fully anticoagulated.  He was last seen in the office by Dr. Hassell Done 3 weeks ago.  Had 3 small wounds that were packed with iodoform gauze. Patient claims everything is healed up now.  I recommend he mark the edges of redness with a sharpie or permanent marker pen.  Place ice / heat over it.  If stable to improving, come to the office Monday to be evaluated to see if further incision and drainage or other intervention is needed.  If markedly worse, go to the emergency room to see if IV in a boxer readmission as needed.  Patient does not one to come to the emergency room.  Patient Active Problem List   Diagnosis Date Noted  . Acute renal insufficiency 01/03/2015  . Esophageal reflux 01/02/2015  . Polyneuropathy in diabetes(357.2) 01/02/2015  . Abscess of postoperative wound of abdominal wall 01/02/2015  . Cellulitis and abscess of trunk 12/31/2014  . S/P IVC filter-temporary REMOVED   . Status post panniculectomy Oct 2015 07/17/2014  . Morbid obesity BMI 65 03/22/2014  . Pure hypercholesterolemia 03/22/2014  . Essential  hypertension, benign 03/22/2014  . Diabetes 03/22/2014  . Panniculitis 03/22/2014  . OSA (obstructive sleep apnea) 03/22/2014  . Hx pulmonary embolism 03/22/2014    Past Medical History  Diagnosis Date  . Morbid obesity   . Pure hypercholesterolemia   . Essential hypertension, benign   . 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 Esophageal reflux   . Type II or unspecified type diabetes mellitus without mention of complication, not stated as uncontrolled   . Anxiety state, unspecified   . Essential hypertension, benign   . Varicose veins of lower extremities with inflammation   . Peripheral vascular disease, unspecified   . Edema   . Type II or unspecified type diabetes mellitus with neurological manifestations, not stated as uncontrolled   . Polyneuropathy in diabetes(357.2)   . Personal history of PE (pulmonary embolism) 2004  . Depression   . Unspecified sleep apnea     uses CPAP  . Obstructive sleep apnea (adult) (pediatric)   . DVT (deep venous thrombosis) 2004  . Shortness of breath     DUE TO WEIGHT AND LOSS OF MOBILITY  . Myalgia and myositis, unspecified     BACK AND LEGS  . Falls frequently   . Poor venous access     PT STATES DIFFICULT TO DRAW BLOOD FROM HIS VEINS    Past Surgical History  Procedure Laterality Date  . No past surgeries    . No previous surgery    . Panniculectomy N/A 07/15/2014    Procedure: PANNICULECTOMY;  Surgeon: Pedro Earls, MD;  Location:  [facility];  Service: General;  Laterality: N/A;    History   Social History  . Marital Status: Married    Spouse Name: N/A  . Number of Children: N/A  . Years of Education: N/A   Occupational History  . Not on file.   Social History Main Topics  . Smoking status: Never Smoker   . Smokeless tobacco: Never Used  . Alcohol Use: No  . Drug Use: No  . Sexual Activity: Not on file   Other Topics Concern  . Not on file   Social History  Narrative    No family history on file.  Current Outpatient Prescriptions  Medication Sig Dispense Refill  . acetaminophen (TYLENOL) 500 MG tablet Take 1,000 mg by mouth every 6 (six) hours as needed for moderate pain or headache.    Marland Kitchen amLODipine (NORVASC) 5 MG tablet Take 5 mg by mouth daily.     . ferrous sulfate 325 (65 FE) MG tablet Take 325 mg by mouth daily with breakfast.    . FLUoxetine (PROZAC) 40 MG capsule Take 40 mg by mouth at bedtime.     . furosemide (LASIX) 20 MG tablet Take 20 mg by mouth daily.     Marland Kitchen HYDROcodone-acetaminophen (NORCO) 5-325 MG per tablet Take 1 tablet by mouth every 4 (four) hours as needed for moderate pain. 30 tablet 0  . indapamide (LOZOL) 2.5 MG tablet Take 5 mg by mouth daily.     . insulin aspart (NOVOLOG) 100 UNIT/ML injection Inject 0-30 Units into the skin 3 (three) times daily as needed for high blood sugar (CBG >150). Per sliding scale    . insulin glargine (LANTUS) 100 UNIT/ML injection Inject 50 Units into the skin at bedtime.     . metFORMIN (GLUCOPHAGE) 1000 MG tablet Take 1,000 mg by mouth 2 (two) times daily with a meal.    . omeprazole (PRILOSEC) 40 MG capsule Take 40 mg by mouth daily.    . pioglitazone (ACTOS) 45 MG tablet Take 45 mg by mouth daily.    . pravastatin (PRAVACHOL) 40 MG tablet Take 40 mg by mouth at bedtime.     . Prenatal Vit-Fe Fumarate-FA (PRENATAL FA PO) Take 1 tablet by mouth daily.    . quinapril (ACCUPRIL) 20 MG tablet Take 20 mg by mouth daily.     . silver sulfADIAZINE (SILVADENE) 1 % cream Apply 1 application topically 2 (two) times daily as needed (wound care).    . warfarin (COUMADIN) 5 MG tablet Take 5-7.5 mg by mouth at bedtime. Take 5mg  on Tuesday, Wednesday, Thursday, Friday, Saturday, and Sunday Take 7.5mg  on Mondays    . Wheat Dextrin (BENEFIBER) POWD Take 15 mLs by mouth daily. Mix in coffee and drink     No current facility-administered medications for this visit.     No Known Allergies  @VS @  No  results found.  Note: This dictation was prepared with Dragon/digital dictation along with [employer]. Any transcriptional errors that result from this process are unintentional.

## 2015-04-09 ENCOUNTER — Encounter: Payer: Self-pay | Admitting: Internal Medicine

## 2015-04-10 ENCOUNTER — Encounter: Payer: Self-pay | Admitting: Internal Medicine

## 2015-09-30 DIAGNOSIS — J069 Acute upper respiratory infection, unspecified: Secondary | ICD-10-CM | POA: Diagnosis not present

## 2015-10-20 DIAGNOSIS — F411 Generalized anxiety disorder: Secondary | ICD-10-CM | POA: Diagnosis not present

## 2015-10-20 DIAGNOSIS — Z86711 Personal history of pulmonary embolism: Secondary | ICD-10-CM | POA: Diagnosis not present

## 2015-10-20 DIAGNOSIS — Z7901 Long term (current) use of anticoagulants: Secondary | ICD-10-CM | POA: Diagnosis not present

## 2015-11-20 DIAGNOSIS — Z7901 Long term (current) use of anticoagulants: Secondary | ICD-10-CM | POA: Diagnosis not present

## 2015-11-20 DIAGNOSIS — I739 Peripheral vascular disease, unspecified: Secondary | ICD-10-CM | POA: Diagnosis not present

## 2015-12-07 ENCOUNTER — Encounter (HOSPITAL_COMMUNITY): Payer: Self-pay | Admitting: Emergency Medicine

## 2015-12-07 ENCOUNTER — Emergency Department (HOSPITAL_COMMUNITY): Payer: PPO

## 2015-12-07 ENCOUNTER — Emergency Department (HOSPITAL_COMMUNITY)
Admission: EM | Admit: 2015-12-07 | Discharge: 2015-12-07 | Disposition: A | Discharge status: Home / Self Care | Payer: PPO | Attending: Emergency Medicine | Admitting: Emergency Medicine

## 2015-12-07 DIAGNOSIS — I10 Essential (primary) hypertension: Secondary | ICD-10-CM | POA: Insufficient documentation

## 2015-12-07 DIAGNOSIS — L03311 Cellulitis of abdominal wall: Secondary | ICD-10-CM | POA: Diagnosis not present

## 2015-12-07 DIAGNOSIS — Z794 Long term (current) use of insulin: Secondary | ICD-10-CM | POA: Insufficient documentation

## 2015-12-07 DIAGNOSIS — L02214 Cutaneous abscess of groin: Secondary | ICD-10-CM | POA: Diagnosis not present

## 2015-12-07 DIAGNOSIS — F419 Anxiety disorder, unspecified: Secondary | ICD-10-CM | POA: Insufficient documentation

## 2015-12-07 DIAGNOSIS — Z86711 Personal history of pulmonary embolism: Secondary | ICD-10-CM | POA: Diagnosis not present

## 2015-12-07 DIAGNOSIS — Z7901 Long term (current) use of anticoagulants: Secondary | ICD-10-CM | POA: Insufficient documentation

## 2015-12-07 DIAGNOSIS — E1140 Type 2 diabetes mellitus with diabetic neuropathy, unspecified: Secondary | ICD-10-CM | POA: Diagnosis not present

## 2015-12-07 DIAGNOSIS — R300 Dysuria: Secondary | ICD-10-CM | POA: Diagnosis not present

## 2015-12-07 DIAGNOSIS — K219 Gastro-esophageal reflux disease without esophagitis: Secondary | ICD-10-CM | POA: Diagnosis not present

## 2015-12-07 DIAGNOSIS — Z9981 Dependence on supplemental oxygen: Secondary | ICD-10-CM | POA: Insufficient documentation

## 2015-12-07 DIAGNOSIS — G4730 Sleep apnea, unspecified: Secondary | ICD-10-CM | POA: Diagnosis not present

## 2015-12-07 DIAGNOSIS — Z86718 Personal history of other venous thrombosis and embolism: Secondary | ICD-10-CM | POA: Diagnosis not present

## 2015-12-07 DIAGNOSIS — Z79899 Other long term (current) drug therapy: Secondary | ICD-10-CM | POA: Diagnosis not present

## 2015-12-07 DIAGNOSIS — E119 Type 2 diabetes mellitus without complications: Secondary | ICD-10-CM | POA: Diagnosis not present

## 2015-12-07 DIAGNOSIS — L089 Local infection of the skin and subcutaneous tissue, unspecified: Secondary | ICD-10-CM | POA: Diagnosis not present

## 2015-12-07 DIAGNOSIS — F329 Major depressive disorder, single episode, unspecified: Secondary | ICD-10-CM | POA: Diagnosis not present

## 2015-12-07 DIAGNOSIS — Z7984 Long term (current) use of oral hypoglycemic drugs: Secondary | ICD-10-CM | POA: Diagnosis not present

## 2015-12-07 DIAGNOSIS — Z9181 History of falling: Secondary | ICD-10-CM | POA: Diagnosis not present

## 2015-12-07 LAB — I-STAT CHEM 8, ED
BUN: 36 mg/dL — ABNORMAL HIGH (ref 6–20)
Calcium, Ion: 1.05 mmol/L — ABNORMAL LOW (ref 1.12–1.23)
Chloride: 99 mmol/L — ABNORMAL LOW (ref 101–111)
Creatinine, Ser: 1.1 mg/dL (ref 0.61–1.24)
Glucose, Bld: 264 mg/dL — ABNORMAL HIGH (ref 65–99)
HEMATOCRIT: 43 % (ref 39.0–52.0)
Hemoglobin: 14.6 g/dL (ref 13.0–17.0)
POTASSIUM: 4.9 mmol/L (ref 3.5–5.1)
SODIUM: 137 mmol/L (ref 135–145)
TCO2: 29 mmol/L (ref 0–100)

## 2015-12-07 LAB — CBC WITH DIFFERENTIAL/PLATELET
BASOS ABS: 0 10*3/uL (ref 0.0–0.1)
BASOS PCT: 0 %
EOS ABS: 0.1 10*3/uL (ref 0.0–0.7)
Eosinophils Relative: 1 %
HCT: 38.9 % — ABNORMAL LOW (ref 39.0–52.0)
HEMOGLOBIN: 12.8 g/dL — AB (ref 13.0–17.0)
LYMPHS PCT: 8 %
Lymphs Abs: 1.2 10*3/uL (ref 0.7–4.0)
MCH: 27.5 pg (ref 26.0–34.0)
MCHC: 32.9 g/dL (ref 30.0–36.0)
MCV: 83.5 fL (ref 78.0–100.0)
Monocytes Absolute: 0.6 10*3/uL (ref 0.1–1.0)
Monocytes Relative: 4 %
NEUTROS PCT: 87 %
Neutro Abs: 13 10*3/uL — ABNORMAL HIGH (ref 1.7–7.7)
Platelets: 246 10*3/uL (ref 150–400)
RBC: 4.66 MIL/uL (ref 4.22–5.81)
RDW: 16.8 % — ABNORMAL HIGH (ref 11.5–15.5)
WBC: 14.9 10*3/uL — ABNORMAL HIGH (ref 4.0–10.5)

## 2015-12-07 LAB — COMPREHENSIVE METABOLIC PANEL
ALBUMIN: 3.1 g/dL — AB (ref 3.5–5.0)
ALK PHOS: 66 U/L (ref 38–126)
ALT: 14 U/L — ABNORMAL LOW (ref 17–63)
AST: 15 U/L (ref 15–41)
Anion gap: 10 (ref 5–15)
BUN: 27 mg/dL — ABNORMAL HIGH (ref 6–20)
CO2: 24 mmol/L (ref 22–32)
CREATININE: 1.19 mg/dL (ref 0.61–1.24)
Calcium: 8.3 mg/dL — ABNORMAL LOW (ref 8.9–10.3)
Chloride: 101 mmol/L (ref 101–111)
GFR calc Af Amer: >60 mL/min (ref >60)
GFR calc non Af Amer: >60 mL/min (ref >60)
GLUCOSE: 302 mg/dL — AB (ref 65–99)
Potassium: 4.2 mmol/L (ref 3.5–5.1)
SODIUM: 135 mmol/L (ref 135–145)
Total Bilirubin: 0.8 mg/dL (ref 0.3–1.2)
Total Protein: 6.3 g/dL — ABNORMAL LOW (ref 6.5–8.1)

## 2015-12-07 LAB — PROTIME-INR
INR: 2.23 — ABNORMAL HIGH (ref 0.00–1.49)
Prothrombin Time: 24.5 seconds — ABNORMAL HIGH (ref 11.6–15.2)

## 2015-12-07 LAB — I-STAT CG4 LACTIC ACID, ED: Lactic Acid, Venous: 1.6 mmol/L (ref 0.5–2.0)

## 2015-12-07 MED ORDER — VANCOMYCIN HCL 10 G IV SOLR
2500.0000 mg | INTRAVENOUS | Status: AC
  Administered 2015-12-07: 2500 mg via INTRAVENOUS
  Filled 2015-12-07: qty 2500

## 2015-12-07 MED ORDER — SODIUM CHLORIDE 0.9 % IV BOLUS (SEPSIS)
1000.0000 mL | Freq: Once | INTRAVENOUS | Status: AC
  Administered 2015-12-07: 1000 mL via INTRAVENOUS

## 2015-12-07 MED ORDER — INSULIN ASPART 100 UNIT/ML ~~LOC~~ SOLN: 10.0000 [IU] | Freq: Once | SUBCUTANEOUS | Status: DC

## 2015-12-07 MED ORDER — ACETAMINOPHEN 325 MG PO TABS
650.0000 mg | ORAL_TABLET | Freq: Once | ORAL | Status: DC
  Filled 2015-12-07: qty 2

## 2015-12-07 MED ORDER — VANCOMYCIN HCL 10 G IV SOLR
1250.0000 mg | Freq: Two times a day (BID) | INTRAVENOUS | Status: DC
  Filled 2015-12-07: qty 1250

## 2015-12-07 MED ORDER — IOHEXOL 300 MG/ML  SOLN
100.0000 mL | Freq: Once | INTRAMUSCULAR | Status: AC | PRN
  Administered 2015-12-07: 100 mL via INTRAVENOUS

## 2015-12-07 MED ORDER — LEVOFLOXACIN 750 MG PO TABS: 750.0000 mg | ORAL_TABLET | Freq: Every day | ORAL | Status: DC

## 2015-12-07 NOTE — ED Provider Notes (Signed)
CSN: [id number]     Arrival date & time 12/07/15  1517 History   First MD Initiated Contact with Patient 12/07/15 1534     Chief Complaint  Patient presents with  . Recurrent Skin Infections      HPI  Patient presents for evaluation of probable cellulitis in his abdominal wall. Patient has a history of morbid obesity and has a large panniculus. Actually had a panniculectomy 2 years ago. Had a abdominal wall abscess requiring incision and drainage and wound VAC. Has had other episodes of recurrent cellulitis.  Waking this morning feeling not well. States that is why he "feels would have a fever". Some generalized malaise. Noticed some erythematous abdominal wall presents of demarcated by his wife. Seen by his primary care physician referred here.  History of diabetes. States sugar at home was 200.  Past Medical History  Diagnosis Date  . Morbid obesity ([place])   . Pure hypercholesterolemia   . Essential hypertension, benign   . 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 Esophageal reflux   . Type II or unspecified type diabetes mellitus without mention of complication, not stated as uncontrolled   . Anxiety state, unspecified   . Essential hypertension, benign   . Varicose veins of lower extremities with inflammation   . Peripheral vascular disease, unspecified ([place])   . Edema   . Type II or unspecified type diabetes mellitus with neurological manifestations, not stated as uncontrolled   . Polyneuropathy in diabetes(357.2)   . Personal history of PE (pulmonary embolism) 2004  . Depression   . Unspecified sleep apnea     uses CPAP  . Obstructive sleep apnea (adult) (pediatric)   . DVT (deep venous thrombosis) ([place]) 2004  . Shortness of breath     DUE TO WEIGHT AND LOSS OF MOBILITY  . Myalgia and myositis, unspecified     BACK AND LEGS  . Falls frequently   . Poor venous access     PT STATES DIFFICULT TO DRAW BLOOD FROM HIS  VEINS   Past Surgical History  Procedure Laterality Date  . No past surgeries    . No previous surgery    . Panniculectomy N/A 07/15/2014    Procedure: PANNICULECTOMY;  Surgeon: Pedro Earls, MD;  Location: [facility];  Service: General;  Laterality: N/A;   History reviewed. No pertinent family history. Social History  Substance Use Topics  . Smoking status: Never Smoker   . Smokeless tobacco: Never Used  . Alcohol Use: No    Review of Systems  Constitutional: Positive for fever and fatigue. Negative for chills, diaphoresis and appetite change.  HENT: Negative for mouth sores, sore throat and trouble swallowing.   Eyes: Negative for visual disturbance.  Respiratory: Negative for cough, chest tightness, shortness of breath and wheezing.   Cardiovascular: Negative for chest pain.  Gastrointestinal: Positive for abdominal pain. Negative for nausea, vomiting, diarrhea and abdominal distention.  Endocrine: Negative for polydipsia, polyphagia and polyuria.  Genitourinary: Negative for dysuria, frequency and hematuria.  Musculoskeletal: Negative for gait problem.  Skin: Positive for color change. Negative for pallor and rash.  Neurological: Negative for dizziness, syncope, light-headedness and headaches.  Hematological: Does not bruise/bleed easily.  Psychiatric/Behavioral: Negative for behavioral problems and confusion.      Allergies  Review of patient's allergies indicates no known allergies.  Home Medications   Prior to Admission medications   Medication Sig Start Date End Date Taking? Authorizing Provider  acetaminophen (TYLENOL) 500 MG tablet Take 1,000-1,500 mg by mouth every 6 (six) hours as needed for moderate pain or headache.    Yes Historical Provider, MD  amLODipine (NORVASC) 5 MG tablet Take 5 mg by mouth daily.    Yes Historical Provider, MD  buPROPion (WELLBUTRIN XL) 150 MG 24 hr tablet Take 150 mg by mouth daily. 10/20/15  Yes Historical Provider, MD  ferrous  sulfate 325 (65 FE) MG tablet Take 325 mg by mouth daily with breakfast.   Yes Historical Provider, MD  FLUoxetine (PROZAC) 40 MG capsule Take 40 mg by mouth at bedtime.    Yes Historical Provider, MD  furosemide (LASIX) 20 MG tablet Take 20 mg by mouth daily.    Yes Historical Provider, MD  indapamide (LOZOL) 2.5 MG tablet Take 5 mg by mouth daily.    Yes Historical Provider, MD  insulin aspart (NOVOLOG) 100 UNIT/ML injection Inject 0-30 Units into the skin 3 (three) times daily as needed for high blood sugar (CBG >150). Per sliding scale   Yes Historical Provider, MD  insulin glargine (LANTUS) 100 UNIT/ML injection Inject 50 Units into the skin at bedtime.    Yes Historical Provider, MD  metFORMIN (GLUCOPHAGE) 1000 MG tablet Take 1,000 mg by mouth 2 (two) times daily with a meal.   Yes Historical Provider, MD  omeprazole (PRILOSEC) 40 MG capsule Take 40 mg by mouth daily.   Yes Historical Provider, MD  pioglitazone (ACTOS) 45 MG tablet Take 45 mg by mouth daily.   Yes Historical Provider, MD  pravastatin (PRAVACHOL) 40 MG tablet Take 40 mg by mouth at bedtime.    Yes Historical Provider, MD  Prenatal Vit-Fe Fumarate-FA (PRENATAL FA PO) Take 1 tablet by mouth daily.   Yes Historical Provider, MD  quinapril (ACCUPRIL) 20 MG tablet Take 20 mg by mouth daily.    Yes Historical Provider, MD  warfarin (COUMADIN) 5 MG tablet Take 5-7.5 mg by mouth at bedtime. 7.5mg  Mon, Wed, Fri, Sun 5mg  Tues, Thurs, Sat   Yes Historical Provider, MD  Wheat Dextrin (BENEFIBER) POWD Take 15 mLs by mouth daily as needed (constipation). Mix in coffee and drink   Yes Historical Provider, MD  levofloxacin (LEVAQUIN) 750 MG tablet Take 1 tablet (750 mg total) by mouth daily. 12/07/15   Tanna Furry, MD   BP 148/42 mmHg  Pulse 93  Temp(Src) 98.3 F (36.8 C) (Oral)  Resp 20  Ht 6' (1.829 m)  Wt 460 lb (208.655 kg)  BMI 62.37 kg/m2  SpO2 97% Physical Exam  Constitutional: He is oriented to person, place, and time. He  appears well-developed and well-nourished. No distress.  HENT:  Head: Normocephalic.  Eyes: Conjunctivae are normal. Pupils are equal, round, and reactive to light. No scleral icterus.  Neck: Normal range of motion. Neck supple. No thyromegaly present.  Cardiovascular: Normal rate and regular rhythm.  Exam reveals no gallop and no friction rub.   No murmur heard. Pulmonary/Chest: Effort normal and breath sounds normal. No respiratory distress. He has no wheezes. He has no rales.  Abdominal: Soft. Bowel sounds are normal. He exhibits no distension. There is no tenderness. There is no rebound.    Musculoskeletal: Normal range of motion.  Neurological: He is alert and oriented to person, place, and time.  Skin: Skin is warm and dry. No rash noted.  Psychiatric: He has a normal mood and affect. His behavior is normal.    ED Course  Procedures (including critical care time) Labs Review Labs  Reviewed  CBC WITH DIFFERENTIAL/PLATELET - Abnormal; Notable for the following:    WBC 14.9 (*)    Hemoglobin 12.8 (*)    HCT 38.9 (*)    RDW 16.8 (*)    Neutro Abs 13.0 (*)    All other components within normal limits  COMPREHENSIVE METABOLIC PANEL - Abnormal; Notable for the following:    Glucose, Bld 302 (*)    BUN 27 (*)    Calcium 8.3 (*)    Total Protein 6.3 (*)    Albumin 3.1 (*)    ALT 14 (*)    All other components within normal limits  PROTIME-INR - Abnormal; Notable for the following:    Prothrombin Time 24.5 (*)    INR 2.23 (*)    All other components within normal limits  I-STAT CHEM 8, ED - Abnormal; Notable for the following:    Chloride 99 (*)    BUN 36 (*)    Glucose, Bld 264 (*)    Calcium, Ion 1.05 (*)    All other components within normal limits  CULTURE, BLOOD (ROUTINE X 2)  CULTURE, BLOOD (ROUTINE X 2)  I-STAT CG4 LACTIC ACID, ED    Imaging Review Ct Abdomen Pelvis W Contrast  12/07/2015  CLINICAL DATA:  Leukocytosis and cellulitis. History of pannus infection.  EXAM: CT ABDOMEN AND PELVIS WITH CONTRAST TECHNIQUE: Multidetector CT imaging of the abdomen and pelvis was performed using the standard protocol following bolus administration of intravenous contrast. CONTRAST:  173mL OMNIPAQUE IOHEXOL 300 MG/ML  SOLN COMPARISON:  CT 12/30/2014. FINDINGS: Lower chest: Clear lung bases. No significant pleural or pericardial effusion. Hepatobiliary: The liver is normal in density without focal abnormality. No evidence of gallstones, gallbladder wall thickening or biliary dilatation. Pancreas: Severe fatty replacement and atrophy of the pancreas. No surrounding inflammatory change or ductal dilatation identified. Spleen: Normal in size without focal abnormality. Adrenals/Urinary Tract: Both adrenal glands appear normal. The kidneys appear normal without evidence of hydronephrosis, urinary tract calculus or mass lesion. The bladder is incompletely distended ; allowing for this, no bladder abnormalities are seen. Stomach/Bowel: No evidence of bowel wall thickening, distention or surrounding inflammatory change. The colon is redundant. The cecum is located within the mid abdomen. The appendix appears normal. Vascular/Lymphatic: Numerous prominent retroperitoneal and pelvic lymph nodes, left greater than right, are similar to the previous examination. No progressive adenopathy demonstrated. Aortoiliac atherosclerosis without evidence of large vessel occlusion. Reproductive: Unremarkable. Other: The anterior abdominal wall is incompletely visualized due to the patient's size. Portions of the left abdominal wall are excluded. At the site of the previously demonstrated soft tissue emphysema in the right lower quadrant, there is linear soft tissue density which most likely represents post inflammatory or postsurgical scarring. No recurrent soft tissue emphysema, fluid collection or hernia identified. Musculoskeletal: No acute or significant osseous findings. IMPRESSION: 1. No acute findings  or explanation for the patient's symptoms. 2. Probable linear scarring in the right lower quadrant abdominal wall from previous inflammation or surgery. No recurrent fluid collection, soft tissue emphysema or hernia. 3. Stable prominent retroperitoneal and pelvic lymph nodes. 4. Severe pancreatic atrophy and fatty replacement placing the patient at risk for pancreatic exocrine deficiency. Electronically Signed   By: Richardean Sale M.D.   On: 12/07/2015 17:54   I have personally reviewed and evaluated these images and lab results as part of my medical decision-making.   EKG Interpretation None      MDM   Final diagnoses:  Cellulitis of abdominal wall  CT obtained. Does not show any abscess formation. I discussed with him next he recommended hospitalization. He politely declines. He states that he is controlled taking care of himself at home with his wife's help. States or return if any failure to improve or worsening symptoms. Will be placed on Levaquin at home. I've asked him to decrease his Coumadin from 7.5 alternating with 5 every other day to 5 mg per day until he has completed his Levaquin.    Tanna Furry, MD 12/07/15 2007

## 2015-12-07 NOTE — ED Notes (Signed)
Per pt, states from PCP-sent here for elevated WBC and cellullitis

## 2015-12-07 NOTE — Discharge Instructions (Signed)
Decrease your coumadin to 5mg /day while on Levauqin. Return to ER with any worsening symptoms, or failure to improve over the next 3-4 days.  Cellulitis Cellulitis is an infection of the skin and the tissue beneath it. The infected area is usually red and tender. Cellulitis occurs most often in the arms and lower legs.  CAUSES  Cellulitis is caused by bacteria that enter the skin through cracks or cuts in the skin. The most common types of bacteria that cause cellulitis are staphylococci and streptococci. SIGNS AND SYMPTOMS   Redness and warmth.  Swelling.  Tenderness or pain.  Fever. DIAGNOSIS  Your health care provider can usually determine what is wrong based on a physical exam. Blood tests may also be done. TREATMENT  Treatment usually involves taking an antibiotic medicine. HOME CARE INSTRUCTIONS   Take your antibiotic medicine as directed by your health care provider. Finish the antibiotic even if you start to feel better.  Keep the infected arm or leg elevated to reduce swelling.  Apply a warm cloth to the affected area up to 4 times per day to relieve pain.  Take medicines only as directed by your health care provider.  Keep all follow-up visits as directed by your health care provider. SEEK MEDICAL CARE IF:   You notice red streaks coming from the infected area.  Your red area gets larger or turns dark in color.  Your bone or joint underneath the infected area becomes painful after the skin has healed.  Your infection returns in the same area or another area.  You notice a swollen bump in the infected area.  You develop new symptoms.  You have a fever. SEEK IMMEDIATE MEDICAL CARE IF:   You feel very sleepy.  You develop vomiting or diarrhea.  You have a general ill feeling (malaise) with muscle aches and pains.   This information is not intended to replace advice given to you by your health care provider. Make sure you discuss any questions you have with  your health care provider.   Document Released: 06/23/2005 Document Revised: 06/04/2015 Document Reviewed: 11/29/2011 Elsevier Interactive Patient Education [employer].

## 2015-12-07 NOTE — Progress Notes (Signed)
Pharmacy Antibiotic Note  Joseph [name]. is a 60 y.o. male admitted on 12/07/2015, admission H&P pending.  Pharmacy has been consulted for Vancomycin dosing.  Note PMH of long-term IV vancomycin per [facility] for abscess and infection s/p panniculectomy. He completed ~27 days of antibiotic therapy in April, 2016.  Note AKI on last admission with vancomycin dosing. Today, SCr 1.1 with CrCl ~ 74 ml/min normalized.  Plan:  Vancomycin 2500 mg IV loading dose stat, then 1250 mg IV q12h.  Measure Vanc trough at steady state.  Goal trough 15-20.  Follow up renal fxn, culture results, and clinical course.   Height: 6' (182.9 cm) Weight: (!) 460 lb (208.655 kg) IBW/kg (Calculated) : 77.6  No data recorded.   Recent Labs Lab 12/07/15 1620 12/07/15 1640 12/07/15 1641  WBC 14.9*  --   --   CREATININE  --   --  1.10  LATICACIDVEN  --  1.60  --     Estimated Creatinine Clearance: 134.6 mL/min (by C-G formula based on Cr of 1.1).    No Known Allergies  Antimicrobials this admission: Vancomycin 3/12 >>   Dose adjustments this admission:  Microbiology results: 3/12 BCx: ordered  Thank you for allowing pharmacy to be a part of this patient's care.  Gretta Arab PharmD, BCPS Pager [phone] 12/07/2015 3:57 PM

## 2015-12-12 LAB — CULTURE, BLOOD (ROUTINE X 2)
Culture: NO GROWTH
Culture: NO GROWTH

## 2015-12-17 DIAGNOSIS — Z7901 Long term (current) use of anticoagulants: Secondary | ICD-10-CM | POA: Diagnosis not present

## 2015-12-17 DIAGNOSIS — I739 Peripheral vascular disease, unspecified: Secondary | ICD-10-CM | POA: Diagnosis not present

## 2015-12-17 DIAGNOSIS — Z23 Encounter for immunization: Secondary | ICD-10-CM | POA: Diagnosis not present

## 2016-01-01 DIAGNOSIS — I739 Peripheral vascular disease, unspecified: Secondary | ICD-10-CM | POA: Diagnosis not present

## 2016-01-01 DIAGNOSIS — Z7901 Long term (current) use of anticoagulants: Secondary | ICD-10-CM | POA: Diagnosis not present

## 2016-01-30 DIAGNOSIS — I739 Peripheral vascular disease, unspecified: Secondary | ICD-10-CM | POA: Diagnosis not present

## 2016-01-30 DIAGNOSIS — Z7901 Long term (current) use of anticoagulants: Secondary | ICD-10-CM | POA: Diagnosis not present

## 2016-03-01 DIAGNOSIS — R5383 Other fatigue: Secondary | ICD-10-CM | POA: Diagnosis not present

## 2016-03-01 DIAGNOSIS — Z1389 Encounter for screening for other disorder: Secondary | ICD-10-CM | POA: Diagnosis not present

## 2016-03-01 DIAGNOSIS — E7800 Pure hypercholesterolemia, unspecified: Secondary | ICD-10-CM | POA: Diagnosis not present

## 2016-03-01 DIAGNOSIS — G4733 Obstructive sleep apnea (adult) (pediatric): Secondary | ICD-10-CM | POA: Diagnosis not present

## 2016-03-01 DIAGNOSIS — I10 Essential (primary) hypertension: Secondary | ICD-10-CM | POA: Diagnosis not present

## 2016-03-01 DIAGNOSIS — E1142 Type 2 diabetes mellitus with diabetic polyneuropathy: Secondary | ICD-10-CM | POA: Diagnosis not present

## 2016-03-01 DIAGNOSIS — F411 Generalized anxiety disorder: Secondary | ICD-10-CM | POA: Diagnosis not present

## 2016-03-01 DIAGNOSIS — Z7901 Long term (current) use of anticoagulants: Secondary | ICD-10-CM | POA: Diagnosis not present

## 2016-03-01 DIAGNOSIS — D509 Iron deficiency anemia, unspecified: Secondary | ICD-10-CM | POA: Diagnosis not present

## 2016-03-01 DIAGNOSIS — R609 Edema, unspecified: Secondary | ICD-10-CM | POA: Diagnosis not present

## 2016-03-10 DIAGNOSIS — G4733 Obstructive sleep apnea (adult) (pediatric): Secondary | ICD-10-CM | POA: Diagnosis not present

## 2016-03-31 DIAGNOSIS — I739 Peripheral vascular disease, unspecified: Secondary | ICD-10-CM | POA: Diagnosis not present

## 2016-03-31 DIAGNOSIS — Z7901 Long term (current) use of anticoagulants: Secondary | ICD-10-CM | POA: Diagnosis not present

## 2016-05-04 DIAGNOSIS — Z7901 Long term (current) use of anticoagulants: Secondary | ICD-10-CM | POA: Diagnosis not present

## 2016-05-04 DIAGNOSIS — Z86711 Personal history of pulmonary embolism: Secondary | ICD-10-CM | POA: Diagnosis not present

## 2016-06-02 DIAGNOSIS — Z7901 Long term (current) use of anticoagulants: Secondary | ICD-10-CM | POA: Diagnosis not present

## 2016-06-02 DIAGNOSIS — I739 Peripheral vascular disease, unspecified: Secondary | ICD-10-CM | POA: Diagnosis not present

## 2016-06-30 DIAGNOSIS — Z86711 Personal history of pulmonary embolism: Secondary | ICD-10-CM | POA: Diagnosis not present

## 2016-06-30 DIAGNOSIS — Z7901 Long term (current) use of anticoagulants: Secondary | ICD-10-CM | POA: Diagnosis not present

## 2016-07-09 DIAGNOSIS — Z7901 Long term (current) use of anticoagulants: Secondary | ICD-10-CM | POA: Diagnosis not present

## 2016-07-09 DIAGNOSIS — I739 Peripheral vascular disease, unspecified: Secondary | ICD-10-CM | POA: Diagnosis not present

## 2016-07-29 DIAGNOSIS — S61012A Laceration without foreign body of left thumb without damage to nail, initial encounter: Secondary | ICD-10-CM | POA: Diagnosis not present

## 2016-08-06 DIAGNOSIS — Z7901 Long term (current) use of anticoagulants: Secondary | ICD-10-CM | POA: Diagnosis not present

## 2016-08-06 DIAGNOSIS — Z86711 Personal history of pulmonary embolism: Secondary | ICD-10-CM | POA: Diagnosis not present

## 2016-08-06 DIAGNOSIS — Z23 Encounter for immunization: Secondary | ICD-10-CM | POA: Diagnosis not present

## 2016-08-31 DIAGNOSIS — Z7901 Long term (current) use of anticoagulants: Secondary | ICD-10-CM | POA: Diagnosis not present

## 2016-08-31 DIAGNOSIS — E1142 Type 2 diabetes mellitus with diabetic polyneuropathy: Secondary | ICD-10-CM | POA: Diagnosis not present

## 2016-08-31 DIAGNOSIS — R609 Edema, unspecified: Secondary | ICD-10-CM | POA: Diagnosis not present

## 2016-08-31 DIAGNOSIS — D509 Iron deficiency anemia, unspecified: Secondary | ICD-10-CM | POA: Diagnosis not present

## 2016-08-31 DIAGNOSIS — I10 Essential (primary) hypertension: Secondary | ICD-10-CM | POA: Diagnosis not present

## 2016-08-31 DIAGNOSIS — F411 Generalized anxiety disorder: Secondary | ICD-10-CM | POA: Diagnosis not present

## 2016-08-31 DIAGNOSIS — K219 Gastro-esophageal reflux disease without esophagitis: Secondary | ICD-10-CM | POA: Diagnosis not present

## 2016-08-31 DIAGNOSIS — Z794 Long term (current) use of insulin: Secondary | ICD-10-CM | POA: Diagnosis not present

## 2016-08-31 DIAGNOSIS — E7800 Pure hypercholesterolemia, unspecified: Secondary | ICD-10-CM | POA: Diagnosis not present

## 2016-10-06 DIAGNOSIS — Z7901 Long term (current) use of anticoagulants: Secondary | ICD-10-CM | POA: Diagnosis not present

## 2016-10-06 DIAGNOSIS — I739 Peripheral vascular disease, unspecified: Secondary | ICD-10-CM | POA: Diagnosis not present

## 2016-11-09 DIAGNOSIS — Z7901 Long term (current) use of anticoagulants: Secondary | ICD-10-CM | POA: Diagnosis not present

## 2016-11-09 DIAGNOSIS — I739 Peripheral vascular disease, unspecified: Secondary | ICD-10-CM | POA: Diagnosis not present

## 2016-11-10 ENCOUNTER — Encounter: Payer: Self-pay | Admitting: Podiatry

## 2016-11-10 ENCOUNTER — Ambulatory Visit (INDEPENDENT_AMBULATORY_CARE_PROVIDER_SITE_OTHER): Payer: PPO | Admitting: Podiatry

## 2016-11-10 DIAGNOSIS — E1151 Type 2 diabetes mellitus with diabetic peripheral angiopathy without gangrene: Secondary | ICD-10-CM

## 2016-11-10 DIAGNOSIS — E0842 Diabetes mellitus due to underlying condition with diabetic polyneuropathy: Secondary | ICD-10-CM | POA: Diagnosis not present

## 2016-11-10 DIAGNOSIS — R0989 Other specified symptoms and signs involving the circulatory and respiratory systems: Secondary | ICD-10-CM

## 2016-11-10 DIAGNOSIS — B351 Tinea unguium: Secondary | ICD-10-CM | POA: Diagnosis not present

## 2016-11-10 NOTE — Patient Instructions (Signed)

## 2016-11-10 NOTE — Progress Notes (Signed)
   Subjective:    Patient ID: Joseph Dowse., male    DOB: 06-May-1956, 61 y.o.   MRN: [id number]  HPI     This patient presents today with his wife present treatment room. He says that his toenails are thick and discolored and he is requesting trimming of the toenails. He states he is a diabetic since age 73 and states that he has a history of neuropathy as well as leg ulcers associated with diabetes and possible venous disease. He denies any recent podiatric care  He denies history of claudication or amputation Denies history of smoking   Review of Systems  All other systems reviewed and are negative.      Objective:   Physical Exam  Patient estimates high at 6 foot 1 and weight approximate 475 pounds  Vascular: DP pulses 2/4 bilaterally PT pulses 0/4 bilaterally Capillary reflex within normal limits bilaterally  Neurological: Sensation to 10 g monofilament wire intact 2/5 bilaterally Vibratory sensation nonreactive bilaterally Ankle reflexes nonreactive bilaterally  Dermatological: No open skin lesions bilaterally Hyperpigmentation and atrophic skin anterior lower legs bilaterally Atrophic skin bilaterally absent right hallux nail The remaining toenails are elongated, brittle, deformed 6-10  Musculoskeletal: Manual motor testing dorsi flexion, plantar flexion 5/5 bilaterally       Assessment & Plan:   Assessment: Diabetic with peripheral arterial disease 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 Recommend periodic debridement nails at three-month intervals. Patient verbally consents The toenails 6-10 are debrided mechanically and electrically without any bleeding  Reappoint 3 months

## 2016-12-20 DIAGNOSIS — Z86711 Personal history of pulmonary embolism: Secondary | ICD-10-CM | POA: Diagnosis not present

## 2016-12-20 DIAGNOSIS — Z7901 Long term (current) use of anticoagulants: Secondary | ICD-10-CM | POA: Diagnosis not present

## 2017-01-17 DIAGNOSIS — I739 Peripheral vascular disease, unspecified: Secondary | ICD-10-CM | POA: Diagnosis not present

## 2017-01-17 DIAGNOSIS — Z7901 Long term (current) use of anticoagulants: Secondary | ICD-10-CM | POA: Diagnosis not present

## 2017-02-09 ENCOUNTER — Ambulatory Visit: Payer: PPO | Admitting: Podiatry

## 2017-02-16 DIAGNOSIS — I739 Peripheral vascular disease, unspecified: Secondary | ICD-10-CM | POA: Diagnosis not present

## 2017-02-16 DIAGNOSIS — Z7901 Long term (current) use of anticoagulants: Secondary | ICD-10-CM | POA: Diagnosis not present

## 2017-03-02 ENCOUNTER — Encounter: Payer: Self-pay | Admitting: Podiatry

## 2017-03-02 ENCOUNTER — Ambulatory Visit (INDEPENDENT_AMBULATORY_CARE_PROVIDER_SITE_OTHER): Payer: PPO | Admitting: Podiatry

## 2017-03-02 VITALS — BP 140/63 | HR 91 | Resp 18

## 2017-03-02 DIAGNOSIS — Z23 Encounter for immunization: Secondary | ICD-10-CM | POA: Diagnosis not present

## 2017-03-02 DIAGNOSIS — B351 Tinea unguium: Secondary | ICD-10-CM | POA: Diagnosis not present

## 2017-03-02 DIAGNOSIS — E1140 Type 2 diabetes mellitus with diabetic neuropathy, unspecified: Secondary | ICD-10-CM | POA: Diagnosis not present

## 2017-03-02 DIAGNOSIS — Z0000 Encounter for general adult medical examination without abnormal findings: Secondary | ICD-10-CM | POA: Diagnosis not present

## 2017-03-02 DIAGNOSIS — E1142 Type 2 diabetes mellitus with diabetic polyneuropathy: Secondary | ICD-10-CM

## 2017-03-02 DIAGNOSIS — E7800 Pure hypercholesterolemia, unspecified: Secondary | ICD-10-CM | POA: Diagnosis not present

## 2017-03-02 DIAGNOSIS — I739 Peripheral vascular disease, unspecified: Secondary | ICD-10-CM | POA: Diagnosis not present

## 2017-03-02 DIAGNOSIS — Z125 Encounter for screening for malignant neoplasm of prostate: Secondary | ICD-10-CM | POA: Diagnosis not present

## 2017-03-02 DIAGNOSIS — E1151 Type 2 diabetes mellitus with diabetic peripheral angiopathy without gangrene: Secondary | ICD-10-CM

## 2017-03-02 DIAGNOSIS — I10 Essential (primary) hypertension: Secondary | ICD-10-CM | POA: Diagnosis not present

## 2017-03-02 DIAGNOSIS — Z7901 Long term (current) use of anticoagulants: Secondary | ICD-10-CM | POA: Diagnosis not present

## 2017-03-02 DIAGNOSIS — R609 Edema, unspecified: Secondary | ICD-10-CM | POA: Diagnosis not present

## 2017-03-02 DIAGNOSIS — Z1389 Encounter for screening for other disorder: Secondary | ICD-10-CM | POA: Diagnosis not present

## 2017-03-02 DIAGNOSIS — Z1211 Encounter for screening for malignant neoplasm of colon: Secondary | ICD-10-CM | POA: Diagnosis not present

## 2017-03-02 DIAGNOSIS — Z86711 Personal history of pulmonary embolism: Secondary | ICD-10-CM | POA: Diagnosis not present

## 2017-03-02 NOTE — Patient Instructions (Signed)
Removed Band-Aid on the fourth left toe and 1-3 days and apply topical antibiotic ointment and a Band-Aid daily until a scab forms  Diabetes and Foot Care Diabetes may cause you to have problems because of poor blood supply (circulation) to your feet and legs. This may cause the skin on your feet to become thinner, break easier, and heal more slowly. Your skin may become dry, and the skin may peel and crack. You may also have nerve damage in your legs and feet causing decreased feeling in them. You may not notice minor injuries to your feet that could lead to infections or more serious problems. Taking care of your feet is one of the most important things you can do for yourself. Follow these instructions at home:  Wear shoes at all times, even in the house. Do not go barefoot. Bare feet are easily injured.  Check your feet daily for blisters, cuts, and redness. If you cannot see the bottom of your feet, use a mirror or ask someone for help.  Wash your feet with warm water (do not use hot water) and mild soap. Then pat your feet and the areas between your toes until they are completely dry. Do not soak your feet as this can dry your skin.  Apply a moisturizing lotion or petroleum jelly (that does not contain alcohol and is unscented) to the skin on your feet and to dry, brittle toenails. Do not apply lotion between your toes.  Trim your toenails straight across. Do not dig under them or around the cuticle. File the edges of your nails with an emery board or nail file.  Do not cut corns or calluses or try to remove them with medicine.  Wear clean socks or stockings every day. Make sure they are not too tight. Do not wear knee-high stockings since they may decrease blood flow to your legs.  Wear shoes that fit properly and have enough cushioning. To break in new shoes, wear them for just a few hours a day. This prevents you from injuring your feet. Always look in your shoes before you put them on  to be sure there are no objects inside.  Do not cross your legs. This may decrease the blood flow to your feet.  If you find a minor scrape, cut, or break in the skin on your feet, keep it and the skin around it clean and dry. These areas may be cleansed with mild soap and water. Do not cleanse the area with peroxide, alcohol, or iodine.  When you remove an adhesive bandage, be sure not to damage the skin around it.  If you have a wound, look at it several times a day to make sure it is healing.  Do not use heating pads or hot water bottles. They may burn your skin. If you have lost feeling in your feet or legs, you may not know it is happening until it is too late.  Make sure your health care provider performs a complete foot exam at least annually or more often if you have foot problems. Report any cuts, sores, or bruises to your health care provider immediately. Contact a health care provider if:  You have an injury that is not healing.  You have cuts or breaks in the skin.  You have an ingrown nail.  You notice redness on your legs or feet.  You feel burning or tingling in your legs or feet.  You have pain or cramps in your legs  and feet.  Your legs or feet are numb.  Your feet always feel cold. Get help right away if:  There is increasing redness, swelling, or pain in or around a wound.  There is a red line that goes up your leg.  Pus is coming from a wound.  You develop a fever or as directed by your health care provider.  You notice a bad smell coming from an ulcer or wound. This information is not intended to replace advice given to you by your health care provider. Make sure you discuss any questions you have with your health care provider. Document Released: 09/10/2000 Document Revised: 02/19/2016 Document Reviewed: 02/20/2013 Elsevier Interactive Patient Education  2017 [employer].

## 2017-03-02 NOTE — Progress Notes (Signed)
Patient ID: Joseph [name]., male   DOB: 18-Jun-1956, 61 y.o.   MRN: [id number]   Subjective: This patient presents today with his wife present treatment room. He says that his toenails are thick and discolored and he is requesting trimming of the toenails. He states he is a diabetic since age 72 and states that he has a history of neuropathy as well as leg ulcers associated with diabetes and possible venous disease..   Objective:  Patient estimates high at 6 foot 1 and weight approximate 475 pounds  Vascular: DP pulses 2/4 bilaterally PT pulses 0/4 bilaterally Capillary reflex within normal limits bilaterally  Neurological: Sensation to 10 g monofilament wire intact 2/5 bilaterally Vibratory sensation nonreactive bilaterally Ankle reflexes nonreactive bilaterally  Dermatological: No open skin lesions bilaterally Hyperpigmentation and atrophic skin anterior lower legs bilaterally Atrophic skin bilaterally absent right hallux nail The remaining toenails are elongated, brittle, deformed 6-10  Musculoskeletal: Manual motor testing dorsi flexion, plantar flexion 5/5 bilaterally  Assessment: Diabetic with peripheral arterial disease 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 Patient structure removed Band-Aid 1-3 days continue to apply topical antibiotic ointment and a Band-Aid daily until a scab forms  Reappoint 3 months

## 2017-03-16 DIAGNOSIS — Z86711 Personal history of pulmonary embolism: Secondary | ICD-10-CM | POA: Diagnosis not present

## 2017-03-16 DIAGNOSIS — Z7901 Long term (current) use of anticoagulants: Secondary | ICD-10-CM | POA: Diagnosis not present

## 2017-04-13 DIAGNOSIS — Z7901 Long term (current) use of anticoagulants: Secondary | ICD-10-CM | POA: Diagnosis not present

## 2017-04-13 DIAGNOSIS — Z86711 Personal history of pulmonary embolism: Secondary | ICD-10-CM | POA: Diagnosis not present

## 2017-05-11 DIAGNOSIS — Z7901 Long term (current) use of anticoagulants: Secondary | ICD-10-CM | POA: Diagnosis not present

## 2017-05-11 DIAGNOSIS — I739 Peripheral vascular disease, unspecified: Secondary | ICD-10-CM | POA: Diagnosis not present

## 2017-06-07 ENCOUNTER — Ambulatory Visit: Payer: PPO | Admitting: Podiatry

## 2017-06-08 DIAGNOSIS — Z7901 Long term (current) use of anticoagulants: Secondary | ICD-10-CM | POA: Diagnosis not present

## 2017-06-08 DIAGNOSIS — I739 Peripheral vascular disease, unspecified: Secondary | ICD-10-CM | POA: Diagnosis not present

## 2017-07-06 DIAGNOSIS — I739 Peripheral vascular disease, unspecified: Secondary | ICD-10-CM | POA: Diagnosis not present

## 2017-07-06 DIAGNOSIS — Z7901 Long term (current) use of anticoagulants: Secondary | ICD-10-CM | POA: Diagnosis not present

## 2017-08-02 DIAGNOSIS — E119 Type 2 diabetes mellitus without complications: Secondary | ICD-10-CM | POA: Diagnosis not present

## 2017-08-02 DIAGNOSIS — H2513 Age-related nuclear cataract, bilateral: Secondary | ICD-10-CM | POA: Diagnosis not present

## 2017-08-02 DIAGNOSIS — Z794 Long term (current) use of insulin: Secondary | ICD-10-CM | POA: Diagnosis not present

## 2017-08-02 DIAGNOSIS — H268 Other specified cataract: Secondary | ICD-10-CM | POA: Diagnosis not present

## 2017-08-03 DIAGNOSIS — Z7901 Long term (current) use of anticoagulants: Secondary | ICD-10-CM | POA: Diagnosis not present

## 2017-09-19 DIAGNOSIS — F411 Generalized anxiety disorder: Secondary | ICD-10-CM | POA: Diagnosis not present

## 2017-09-19 DIAGNOSIS — E1142 Type 2 diabetes mellitus with diabetic polyneuropathy: Secondary | ICD-10-CM | POA: Diagnosis not present

## 2017-09-19 DIAGNOSIS — Z794 Long term (current) use of insulin: Secondary | ICD-10-CM | POA: Diagnosis not present

## 2017-09-19 DIAGNOSIS — K219 Gastro-esophageal reflux disease without esophagitis: Secondary | ICD-10-CM | POA: Diagnosis not present

## 2017-09-19 DIAGNOSIS — E7800 Pure hypercholesterolemia, unspecified: Secondary | ICD-10-CM | POA: Diagnosis not present

## 2017-09-19 DIAGNOSIS — Z7901 Long term (current) use of anticoagulants: Secondary | ICD-10-CM | POA: Diagnosis not present

## 2017-09-19 DIAGNOSIS — I10 Essential (primary) hypertension: Secondary | ICD-10-CM | POA: Diagnosis not present

## 2017-09-19 DIAGNOSIS — R609 Edema, unspecified: Secondary | ICD-10-CM | POA: Diagnosis not present

## 2017-09-19 DIAGNOSIS — Z86711 Personal history of pulmonary embolism: Secondary | ICD-10-CM | POA: Diagnosis not present

## 2017-09-19 DIAGNOSIS — Z23 Encounter for immunization: Secondary | ICD-10-CM | POA: Diagnosis not present

## 2017-09-19 DIAGNOSIS — Z6841 Body Mass Index (BMI) 40.0 and over, adult: Secondary | ICD-10-CM | POA: Diagnosis not present

## 2017-10-26 DIAGNOSIS — Z7901 Long term (current) use of anticoagulants: Secondary | ICD-10-CM | POA: Diagnosis not present

## 2017-10-26 DIAGNOSIS — Z86711 Personal history of pulmonary embolism: Secondary | ICD-10-CM | POA: Diagnosis not present

## 2017-11-22 DIAGNOSIS — Z7901 Long term (current) use of anticoagulants: Secondary | ICD-10-CM | POA: Diagnosis not present

## 2017-11-22 DIAGNOSIS — Z86711 Personal history of pulmonary embolism: Secondary | ICD-10-CM | POA: Diagnosis not present

## 2017-12-21 DIAGNOSIS — Z7901 Long term (current) use of anticoagulants: Secondary | ICD-10-CM | POA: Diagnosis not present

## 2017-12-21 DIAGNOSIS — Z86711 Personal history of pulmonary embolism: Secondary | ICD-10-CM | POA: Diagnosis not present

## 2018-01-20 DIAGNOSIS — I739 Peripheral vascular disease, unspecified: Secondary | ICD-10-CM | POA: Diagnosis not present

## 2018-01-20 DIAGNOSIS — Z7901 Long term (current) use of anticoagulants: Secondary | ICD-10-CM | POA: Diagnosis not present

## 2018-02-15 DIAGNOSIS — Z7901 Long term (current) use of anticoagulants: Secondary | ICD-10-CM | POA: Diagnosis not present

## 2018-02-15 DIAGNOSIS — Z86711 Personal history of pulmonary embolism: Secondary | ICD-10-CM | POA: Diagnosis not present

## 2018-03-24 DIAGNOSIS — E1142 Type 2 diabetes mellitus with diabetic polyneuropathy: Secondary | ICD-10-CM | POA: Diagnosis not present

## 2018-03-24 DIAGNOSIS — F411 Generalized anxiety disorder: Secondary | ICD-10-CM | POA: Diagnosis not present

## 2018-03-24 DIAGNOSIS — R609 Edema, unspecified: Secondary | ICD-10-CM | POA: Diagnosis not present

## 2018-03-24 DIAGNOSIS — I10 Essential (primary) hypertension: Secondary | ICD-10-CM | POA: Diagnosis not present

## 2018-03-24 DIAGNOSIS — R946 Abnormal results of thyroid function studies: Secondary | ICD-10-CM | POA: Diagnosis not present

## 2018-03-24 DIAGNOSIS — Z7984 Long term (current) use of oral hypoglycemic drugs: Secondary | ICD-10-CM | POA: Diagnosis not present

## 2018-03-24 DIAGNOSIS — K219 Gastro-esophageal reflux disease without esophagitis: Secondary | ICD-10-CM | POA: Diagnosis not present

## 2018-03-24 DIAGNOSIS — Z7901 Long term (current) use of anticoagulants: Secondary | ICD-10-CM | POA: Diagnosis not present

## 2018-03-24 DIAGNOSIS — E7800 Pure hypercholesterolemia, unspecified: Secondary | ICD-10-CM | POA: Diagnosis not present

## 2018-03-24 DIAGNOSIS — Z86711 Personal history of pulmonary embolism: Secondary | ICD-10-CM | POA: Diagnosis not present

## 2018-03-24 DIAGNOSIS — Z0000 Encounter for general adult medical examination without abnormal findings: Secondary | ICD-10-CM | POA: Diagnosis not present

## 2018-04-21 DIAGNOSIS — Z86711 Personal history of pulmonary embolism: Secondary | ICD-10-CM | POA: Diagnosis not present

## 2018-04-21 DIAGNOSIS — Z7901 Long term (current) use of anticoagulants: Secondary | ICD-10-CM | POA: Diagnosis not present

## 2018-05-19 DIAGNOSIS — Z7901 Long term (current) use of anticoagulants: Secondary | ICD-10-CM | POA: Diagnosis not present

## 2018-05-19 DIAGNOSIS — Z86711 Personal history of pulmonary embolism: Secondary | ICD-10-CM | POA: Diagnosis not present

## 2018-06-16 DIAGNOSIS — Z7901 Long term (current) use of anticoagulants: Secondary | ICD-10-CM | POA: Diagnosis not present

## 2018-06-16 DIAGNOSIS — I739 Peripheral vascular disease, unspecified: Secondary | ICD-10-CM | POA: Diagnosis not present

## 2018-07-19 DIAGNOSIS — Z7901 Long term (current) use of anticoagulants: Secondary | ICD-10-CM | POA: Diagnosis not present

## 2018-07-19 DIAGNOSIS — I739 Peripheral vascular disease, unspecified: Secondary | ICD-10-CM | POA: Diagnosis not present

## 2018-08-18 DIAGNOSIS — Z7901 Long term (current) use of anticoagulants: Secondary | ICD-10-CM | POA: Diagnosis not present

## 2018-08-18 DIAGNOSIS — Z86711 Personal history of pulmonary embolism: Secondary | ICD-10-CM | POA: Diagnosis not present

## 2018-09-18 DIAGNOSIS — Z86711 Personal history of pulmonary embolism: Secondary | ICD-10-CM | POA: Diagnosis not present

## 2018-09-18 DIAGNOSIS — Z7901 Long term (current) use of anticoagulants: Secondary | ICD-10-CM | POA: Diagnosis not present

## 2018-10-24 DIAGNOSIS — Z23 Encounter for immunization: Secondary | ICD-10-CM | POA: Diagnosis not present

## 2018-10-24 DIAGNOSIS — E1142 Type 2 diabetes mellitus with diabetic polyneuropathy: Secondary | ICD-10-CM | POA: Diagnosis not present

## 2018-10-24 DIAGNOSIS — Z7901 Long term (current) use of anticoagulants: Secondary | ICD-10-CM | POA: Diagnosis not present

## 2018-10-24 DIAGNOSIS — E7800 Pure hypercholesterolemia, unspecified: Secondary | ICD-10-CM | POA: Diagnosis not present

## 2018-10-24 DIAGNOSIS — K219 Gastro-esophageal reflux disease without esophagitis: Secondary | ICD-10-CM | POA: Diagnosis not present

## 2018-10-24 DIAGNOSIS — F411 Generalized anxiety disorder: Secondary | ICD-10-CM | POA: Diagnosis not present

## 2018-10-24 DIAGNOSIS — I10 Essential (primary) hypertension: Secondary | ICD-10-CM | POA: Diagnosis not present

## 2018-11-21 DIAGNOSIS — Z86711 Personal history of pulmonary embolism: Secondary | ICD-10-CM | POA: Diagnosis not present

## 2018-11-21 DIAGNOSIS — Z7901 Long term (current) use of anticoagulants: Secondary | ICD-10-CM | POA: Diagnosis not present

## 2019-01-16 DIAGNOSIS — Z7901 Long term (current) use of anticoagulants: Secondary | ICD-10-CM | POA: Diagnosis not present

## 2019-01-16 DIAGNOSIS — Z86711 Personal history of pulmonary embolism: Secondary | ICD-10-CM | POA: Diagnosis not present

## 2019-02-07 DIAGNOSIS — G4733 Obstructive sleep apnea (adult) (pediatric): Secondary | ICD-10-CM | POA: Diagnosis not present

## 2019-02-14 DIAGNOSIS — Z7901 Long term (current) use of anticoagulants: Secondary | ICD-10-CM | POA: Diagnosis not present

## 2019-02-14 DIAGNOSIS — I739 Peripheral vascular disease, unspecified: Secondary | ICD-10-CM | POA: Diagnosis not present

## 2019-03-12 DIAGNOSIS — I739 Peripheral vascular disease, unspecified: Secondary | ICD-10-CM | POA: Diagnosis not present

## 2019-03-12 DIAGNOSIS — Z7901 Long term (current) use of anticoagulants: Secondary | ICD-10-CM | POA: Diagnosis not present

## 2019-03-27 DIAGNOSIS — Z1389 Encounter for screening for other disorder: Secondary | ICD-10-CM | POA: Diagnosis not present

## 2019-03-27 DIAGNOSIS — Z7901 Long term (current) use of anticoagulants: Secondary | ICD-10-CM | POA: Diagnosis not present

## 2019-03-27 DIAGNOSIS — E7800 Pure hypercholesterolemia, unspecified: Secondary | ICD-10-CM | POA: Diagnosis not present

## 2019-03-27 DIAGNOSIS — K219 Gastro-esophageal reflux disease without esophagitis: Secondary | ICD-10-CM | POA: Diagnosis not present

## 2019-03-27 DIAGNOSIS — Z0000 Encounter for general adult medical examination without abnormal findings: Secondary | ICD-10-CM | POA: Diagnosis not present

## 2019-03-27 DIAGNOSIS — Z794 Long term (current) use of insulin: Secondary | ICD-10-CM | POA: Diagnosis not present

## 2019-03-27 DIAGNOSIS — Z86711 Personal history of pulmonary embolism: Secondary | ICD-10-CM | POA: Diagnosis not present

## 2019-03-27 DIAGNOSIS — E1165 Type 2 diabetes mellitus with hyperglycemia: Secondary | ICD-10-CM | POA: Diagnosis not present

## 2019-03-27 DIAGNOSIS — I10 Essential (primary) hypertension: Secondary | ICD-10-CM | POA: Diagnosis not present

## 2019-03-27 DIAGNOSIS — E1140 Type 2 diabetes mellitus with diabetic neuropathy, unspecified: Secondary | ICD-10-CM | POA: Diagnosis not present

## 2019-03-27 DIAGNOSIS — F411 Generalized anxiety disorder: Secondary | ICD-10-CM | POA: Diagnosis not present

## 2019-03-27 DIAGNOSIS — D509 Iron deficiency anemia, unspecified: Secondary | ICD-10-CM | POA: Diagnosis not present

## 2019-03-27 DIAGNOSIS — E1142 Type 2 diabetes mellitus with diabetic polyneuropathy: Secondary | ICD-10-CM | POA: Diagnosis not present

## 2019-03-27 DIAGNOSIS — R609 Edema, unspecified: Secondary | ICD-10-CM | POA: Diagnosis not present

## 2019-03-28 DIAGNOSIS — Z7901 Long term (current) use of anticoagulants: Secondary | ICD-10-CM | POA: Diagnosis not present

## 2019-03-28 DIAGNOSIS — E7800 Pure hypercholesterolemia, unspecified: Secondary | ICD-10-CM | POA: Diagnosis not present

## 2019-03-28 DIAGNOSIS — Z1211 Encounter for screening for malignant neoplasm of colon: Secondary | ICD-10-CM | POA: Diagnosis not present

## 2019-03-28 DIAGNOSIS — R946 Abnormal results of thyroid function studies: Secondary | ICD-10-CM | POA: Diagnosis not present

## 2019-03-28 DIAGNOSIS — E1142 Type 2 diabetes mellitus with diabetic polyneuropathy: Secondary | ICD-10-CM | POA: Diagnosis not present

## 2019-03-28 DIAGNOSIS — I10 Essential (primary) hypertension: Secondary | ICD-10-CM | POA: Diagnosis not present

## 2019-03-28 DIAGNOSIS — Z125 Encounter for screening for malignant neoplasm of prostate: Secondary | ICD-10-CM | POA: Diagnosis not present

## 2019-04-25 DIAGNOSIS — G4733 Obstructive sleep apnea (adult) (pediatric): Secondary | ICD-10-CM | POA: Diagnosis not present

## 2019-05-08 DIAGNOSIS — Z7901 Long term (current) use of anticoagulants: Secondary | ICD-10-CM | POA: Diagnosis not present

## 2019-05-08 DIAGNOSIS — Z86711 Personal history of pulmonary embolism: Secondary | ICD-10-CM | POA: Diagnosis not present

## 2019-05-16 DIAGNOSIS — Z1211 Encounter for screening for malignant neoplasm of colon: Secondary | ICD-10-CM | POA: Diagnosis not present

## 2019-05-28 DIAGNOSIS — E1142 Type 2 diabetes mellitus with diabetic polyneuropathy: Secondary | ICD-10-CM | POA: Diagnosis not present

## 2019-05-28 DIAGNOSIS — D509 Iron deficiency anemia, unspecified: Secondary | ICD-10-CM | POA: Diagnosis not present

## 2019-05-28 DIAGNOSIS — E1165 Type 2 diabetes mellitus with hyperglycemia: Secondary | ICD-10-CM | POA: Diagnosis not present

## 2019-05-28 DIAGNOSIS — I10 Essential (primary) hypertension: Secondary | ICD-10-CM | POA: Diagnosis not present

## 2019-05-28 DIAGNOSIS — Z794 Long term (current) use of insulin: Secondary | ICD-10-CM | POA: Diagnosis not present

## 2019-05-28 DIAGNOSIS — E1140 Type 2 diabetes mellitus with diabetic neuropathy, unspecified: Secondary | ICD-10-CM | POA: Diagnosis not present

## 2019-06-08 DIAGNOSIS — Z7901 Long term (current) use of anticoagulants: Secondary | ICD-10-CM | POA: Diagnosis not present

## 2019-06-08 DIAGNOSIS — Z86711 Personal history of pulmonary embolism: Secondary | ICD-10-CM | POA: Diagnosis not present

## 2019-06-12 DIAGNOSIS — R195 Other fecal abnormalities: Secondary | ICD-10-CM | POA: Diagnosis not present

## 2019-06-15 ENCOUNTER — Other Ambulatory Visit: Payer: Self-pay | Admitting: Gastroenterology

## 2019-06-22 DIAGNOSIS — G4733 Obstructive sleep apnea (adult) (pediatric): Secondary | ICD-10-CM | POA: Diagnosis not present

## 2019-07-13 DIAGNOSIS — Z7901 Long term (current) use of anticoagulants: Secondary | ICD-10-CM | POA: Diagnosis not present

## 2019-07-13 DIAGNOSIS — Z86711 Personal history of pulmonary embolism: Secondary | ICD-10-CM | POA: Diagnosis not present

## 2019-07-17 ENCOUNTER — Other Ambulatory Visit (HOSPITAL_COMMUNITY)
Admission: RE | Admit: 2019-07-17 | Discharge: 2019-07-17 | Disposition: A | Discharge status: Home / Self Care | Payer: PPO | Source: Ambulatory Visit | Attending: Gastroenterology | Admitting: Gastroenterology

## 2019-07-17 DIAGNOSIS — Z20828 Contact with and (suspected) exposure to other viral communicable diseases: Secondary | ICD-10-CM | POA: Insufficient documentation

## 2019-07-17 DIAGNOSIS — Z01812 Encounter for preprocedural laboratory examination: Secondary | ICD-10-CM | POA: Diagnosis not present

## 2019-07-18 ENCOUNTER — Other Ambulatory Visit: Payer: Self-pay

## 2019-07-18 ENCOUNTER — Encounter (HOSPITAL_COMMUNITY): Payer: Self-pay

## 2019-07-18 LAB — NOVEL CORONAVIRUS, NAA (HOSP ORDER, SEND-OUT TO REF LAB; TAT 18-24 HRS): SARS-CoV-2, NAA: NOT DETECTED

## 2019-07-18 NOTE — Progress Notes (Signed)
Attempted to obtain medical history via telephone, unable to reach at this time. I left a voicemail to return pre surgical testing department's phone call.  No EKG noted within last 2  Years.

## 2019-07-18 NOTE — Progress Notes (Signed)
SPOKE W/  Joseph [name]     SCREENING SYMPTOMS OF COVID 19:   COUGH--NO  RUNNY NOSE--- NO  SORE THROAT---NO  NASAL CONGESTION----NO  SNEEZING----NO  SHORTNESS OF BREATH---NO  DIFFICULTY BREATHING---NO  TEMP >100.0 -----NO  UNEXPLAINED BODY ACHES------NO  CHILLS -------- NO  HEADACHES ---------NO  LOSS OF SMELL/ TASTE --------NO    HAVE YOU OR ANY FAMILY MEMBER TRAVELLED PAST 14 DAYS OUT OF THE   COUNTY---NO STATE----NO COUNTRY----NO  HAVE YOU OR ANY FAMILY MEMBER BEEN EXPOSED TO ANYONE WITH COVID 19? NO

## 2019-07-18 NOTE — Progress Notes (Signed)
Spoke with Port[employer] equipment requested bari-wheelchair for Joseph [name]'s arrival to 11:30AM 07/20/2019.

## 2019-07-20 ENCOUNTER — Ambulatory Visit (HOSPITAL_COMMUNITY): Payer: PPO | Admitting: Anesthesiology

## 2019-07-20 ENCOUNTER — Encounter (HOSPITAL_COMMUNITY)
Admission: RE | Disposition: A | Discharge status: Home / Self Care | Payer: Self-pay | Source: Home / Self Care | Attending: Gastroenterology

## 2019-07-20 ENCOUNTER — Other Ambulatory Visit: Payer: Self-pay

## 2019-07-20 ENCOUNTER — Ambulatory Visit (HOSPITAL_COMMUNITY)
Admission: RE | Admit: 2019-07-20 | Discharge: 2019-07-20 | Disposition: A | Discharge status: Home / Self Care | Payer: PPO | Attending: Gastroenterology | Admitting: Gastroenterology

## 2019-07-20 ENCOUNTER — Encounter (HOSPITAL_COMMUNITY): Payer: Self-pay

## 2019-07-20 DIAGNOSIS — Z79899 Other long term (current) drug therapy: Secondary | ICD-10-CM | POA: Insufficient documentation

## 2019-07-20 DIAGNOSIS — Z794 Long term (current) use of insulin: Secondary | ICD-10-CM | POA: Insufficient documentation

## 2019-07-20 DIAGNOSIS — Z86711 Personal history of pulmonary embolism: Secondary | ICD-10-CM | POA: Insufficient documentation

## 2019-07-20 DIAGNOSIS — F419 Anxiety disorder, unspecified: Secondary | ICD-10-CM | POA: Insufficient documentation

## 2019-07-20 DIAGNOSIS — Z6841 Body Mass Index (BMI) 40.0 and over, adult: Secondary | ICD-10-CM | POA: Insufficient documentation

## 2019-07-20 DIAGNOSIS — D124 Benign neoplasm of descending colon: Secondary | ICD-10-CM | POA: Diagnosis not present

## 2019-07-20 DIAGNOSIS — E7800 Pure hypercholesterolemia, unspecified: Secondary | ICD-10-CM | POA: Diagnosis not present

## 2019-07-20 DIAGNOSIS — Z7901 Long term (current) use of anticoagulants: Secondary | ICD-10-CM | POA: Insufficient documentation

## 2019-07-20 DIAGNOSIS — E1151 Type 2 diabetes mellitus with diabetic peripheral angiopathy without gangrene: Secondary | ICD-10-CM | POA: Insufficient documentation

## 2019-07-20 DIAGNOSIS — K219 Gastro-esophageal reflux disease without esophagitis: Secondary | ICD-10-CM | POA: Diagnosis not present

## 2019-07-20 DIAGNOSIS — F329 Major depressive disorder, single episode, unspecified: Secondary | ICD-10-CM | POA: Insufficient documentation

## 2019-07-20 DIAGNOSIS — I10 Essential (primary) hypertension: Secondary | ICD-10-CM | POA: Insufficient documentation

## 2019-07-20 DIAGNOSIS — G4733 Obstructive sleep apnea (adult) (pediatric): Secondary | ICD-10-CM | POA: Insufficient documentation

## 2019-07-20 DIAGNOSIS — R195 Other fecal abnormalities: Secondary | ICD-10-CM | POA: Diagnosis not present

## 2019-07-20 DIAGNOSIS — K635 Polyp of colon: Secondary | ICD-10-CM | POA: Diagnosis not present

## 2019-07-20 HISTORY — DX: Infection following a procedure, other surgical site, initial encounter: T81.49XA

## 2019-07-20 HISTORY — PX: COLONOSCOPY WITH PROPOFOL: SHX5780

## 2019-07-20 HISTORY — PX: POLYPECTOMY: SHX5525

## 2019-07-20 HISTORY — DX: Obstructive sleep apnea (adult) (pediatric): G47.33

## 2019-07-20 HISTORY — DX: Dependence on other enabling machines and devices: Z99.89

## 2019-07-20 HISTORY — DX: Presence of spectacles and contact lenses: Z97.3

## 2019-07-20 HISTORY — DX: Personal history of diseases of the blood and blood-forming organs and certain disorders involving the immune mechanism: Z86.2

## 2019-07-20 LAB — GLUCOSE, CAPILLARY
Glucose-Capillary: 357 mg/dL — ABNORMAL HIGH (ref 70–99)
Glucose-Capillary: 394 mg/dL — ABNORMAL HIGH (ref 70–99)

## 2019-07-20 SURGERY — COLONOSCOPY WITH PROPOFOL
Anesthesia: Monitor Anesthesia Care

## 2019-07-20 MED ORDER — PROPOFOL 10 MG/ML IV BOLUS
INTRAVENOUS | Status: AC
  Filled 2019-07-20: qty 20

## 2019-07-20 MED ORDER — PROPOFOL 500 MG/50ML IV EMUL
INTRAVENOUS | Status: AC
  Filled 2019-07-20: qty 50

## 2019-07-20 MED ORDER — PROPOFOL 10 MG/ML IV BOLUS
INTRAVENOUS | Status: DC | PRN
  Administered 2019-07-20: 30 mg via INTRAVENOUS
  Administered 2019-07-20: 40 mg via INTRAVENOUS

## 2019-07-20 MED ORDER — PROPOFOL 500 MG/50ML IV EMUL
INTRAVENOUS | Status: DC | PRN
  Administered 2019-07-20: 75 ug/kg/min via INTRAVENOUS

## 2019-07-20 MED ORDER — LACTATED RINGERS IV SOLN
INTRAVENOUS | Status: DC
  Administered 2019-07-20: 13:00:00 via INTRAVENOUS

## 2019-07-20 MED ORDER — PHENYLEPHRINE 40 MCG/ML (10ML) SYRINGE FOR IV PUSH (FOR BLOOD PRESSURE SUPPORT)
PREFILLED_SYRINGE | INTRAVENOUS | Status: DC | PRN
  Administered 2019-07-20: 80 ug via INTRAVENOUS

## 2019-07-20 MED ORDER — SODIUM CHLORIDE 0.9 % IV SOLN: INTRAVENOUS | Status: DC

## 2019-07-20 SURGICAL SUPPLY — 22 items

## 2019-07-20 NOTE — Transfer of Care (Signed)
Immediate Anesthesia Transfer of Care Note  Patient: Joseph [name].  Procedure(s) Performed: COLONOSCOPY WITH PROPOFOL (N/A ) POLYPECTOMY  Patient Location: PACU  Anesthesia Type:MAC  Level of Consciousness: awake and patient cooperative  Airway & Oxygen Therapy: Patient Spontanous Breathing and Patient connected to face mask  Post-op Assessment: Report given to RN and Post -op Vital signs reviewed and stable  Post vital signs: Reviewed and stable  Last Vitals:  Vitals Value Taken Time  BP    Temp    Pulse    Resp    SpO2      Last Pain:  Vitals:   07/20/19 1207  TempSrc: Oral  PainSc: 0-No pain         Complications: No apparent anesthesia complications

## 2019-07-20 NOTE — Op Note (Signed)
[facility] Patient Name: Joseph [name] Procedure Date: 07/20/2019 MRN: [id number] Attending MD: Wonda Horner , MD Date of Birth: 26-Jun-1956 CSN: [id number] Age: 63 Admit Type: Outpatient Procedure:                Colonoscopy Indications:              Positive fecal immunochemical test Providers:                Wonda Horner, MD, Debara Pickett., Technician, Dellie Catholic Referring MD:              Medicines:                Propofol per Anesthesia Complications:            No immediate complications. Estimated Blood Loss:     Estimated blood loss: none. Procedure:                Pre-Anesthesia Assessment:                           - Prior to the procedure, a History and Physical                            was performed, and patient medications and                            allergies were reviewed. The patient's tolerance of                            previous anesthesia was also reviewed. The risks                            and benefits of the procedure and the sedation                            options and risks were discussed with the patient.                            All questions were answered, and informed consent                            was obtained. Prior Anticoagulants: The patient has                            taken Coumadin (warfarin), last dose was 6 days                            prior to procedure. ASA Grade Assessment: III - A                            patient with severe systemic disease. After  reviewing the risks and benefits, the patient was                            deemed in satisfactory condition to undergo the                            procedure.                           After obtaining informed consent, the colonoscope                            was passed under direct vision. Throughout the                            procedure, the patient's blood pressure,  pulse, and                            oxygen saturations were monitored continuously. The                            PCF-H190DL ([id number] Olympus pediatric colonscope                            was introduced through the anus and advanced to the                            the transverse colon. The rectum was photographed.                            The colonoscopy was extremely difficult due to a                            redundant colon and significant looping marked                            obesity. Unable to effectivley apply pressure or                            move patient.The quality of the bowel preparation                            was fair. Scope In: 1:07:54 PM Scope Out: 1:35:36 PM Total Procedure Duration: 0 hours 27 minutes 42 seconds  Findings:      The perianal and digital rectal examinations were normal.      A 10 mm polyp was found in the proximal descending colon. The polyp was       sessile. The polyp was removed with a hot snare. Resection was complete,       but the polyp tissue was not retrieved. It was lost in the tortuous       colon and stool      I could not reach the cecum. I ran out of scope and could not straighten       the colon out to further advance.  Impression:               - Preparation of the colon was fair.                           - One 10 mm polyp in the proximal descending colon,                            removed with a hot snare. Complete resection. Polyp                            tissue not retrieved. Moderate Sedation:      . Recommendation:           - Resume regular diet.                           - Continue present medications.                           - Post-Procedure Resumption of Anticoagulants:                            Restart warfarin in 3 days as before PO daily                            adjustment per [facility].                           - Will follow up with a virtual colonoscopy in a                             few weeks. Procedure Code(s):        --- Professional ---                           [phone], 52, Colonoscopy, flexible; with removal of                            tumor(s), polyp(s), or other lesion(s) by snare                            technique Diagnosis Code(s):        --- Professional ---                           K63.5, Polyp of colon                           R19.5, Other fecal abnormalities CPT copyright 2019 [employer]. All rights reserved. The codes documented in this report are preliminary and upon coder review may  be revised to meet current compliance requirements. Wonda Horner, MD 07/20/2019 1:48:41 PM This report has been signed electronically. Number of Addenda: 0

## 2019-07-20 NOTE — H&P (Signed)
Patient is here in the endoscopy unit for colonoscopy because of a positive fit test.  No distress  Heart regular  No respiratory problem  Abdomen soft nontender  Impression: Positive FIT  Plan colonoscopy

## 2019-07-20 NOTE — Discharge Instructions (Signed)
YOU HAD AN ENDOSCOPIC PROCEDURE TODAY: Refer to the procedure report and other information in the discharge instructions given to you for any specific questions about what was found during the examination. If this information does not answer your questions, please call [facility] office at [phone] to clarify.   YOU SHOULD EXPECT: Some feelings of bloating in the abdomen. Passage of more gas than usual. Walking can help get rid of the air that was put into your GI tract during the procedure and reduce the bloating. If you had a lower endoscopy (such as a colonoscopy or flexible sigmoidoscopy) you may notice spotting of blood in your stool or on the toilet paper. Some abdominal soreness may be present for a day or two, also.  DIET: Your first meal following the procedure should be a light meal and then it is ok to progress to your normal diet. A half-sandwich or bowl of soup is an example of a good first meal. Heavy or fried foods are harder to digest and may make you feel nauseous or bloated. Drink plenty of fluids but you should avoid alcoholic beverages for 24 hours. If you had a esophageal dilation, please see attached instructions for diet.   ACTIVITY: Your care partner should take you home directly after the procedure. You should plan to take it easy, moving slowly for the rest of the day. You can resume normal activity the day after the procedure however YOU SHOULD NOT DRIVE, use power tools, machinery or perform tasks that involve climbing or major physical exertion for 24 hours (because of the sedation medicines used during the test).   SYMPTOMS TO REPORT IMMEDIATELY: A gastroenterologist can be reached at any hour. Please call [phone]  for any of the following symptoms:   Following lower endoscopy (colonoscopy, flexible sigmoidoscopy) Excessive amounts of blood in the stool  Significant tenderness, worsening of abdominal pains  Swelling of the abdomen that is new, acute  Fever of 100  or higher   Following upper endoscopy (EGD, EUS, ERCP, esophageal dilation) Vomiting of blood or coffee ground material  New, significant abdominal pain  New, significant chest pain or pain under the shoulder blades  Painful or persistently difficult swallowing  New shortness of breath  Black, tarry-looking or red, bloody stools  FOLLOW UP: resume coumadin on Monday If any biopsies were taken you will be contacted by phone or by letter within the next 1-3 weeks. Call [phone]  if you have not heard about the biopsies in 3 weeks.  Please also call with any specific questions about appointments or follow up tests.

## 2019-07-20 NOTE — Anesthesia Preprocedure Evaluation (Addendum)
Anesthesia Evaluation  Patient identified by MRN, date of birth, ID band Patient awake    Reviewed: Allergy & Precautions, H&P , NPO status , Patient's Chart, lab work & pertinent test results  Airway Mallampati: III  TM Distance: >3 FB Neck ROM: Full    Dental no notable dental hx. (+) Teeth Intact, Dental Advisory Given   Pulmonary neg pulmonary ROS, shortness of breath, sleep apnea and Continuous Positive Airway Pressure Ventilation , PE   Pulmonary exam normal breath sounds clear to auscultation       Cardiovascular Exercise Tolerance: Good hypertension, Pt. on medications + Peripheral Vascular Disease  negative cardio ROS Normal cardiovascular exam Rhythm:Regular Rate:Normal  H/O DVT, S/p recent IVC filter placement.  ECG: NSR, low voltage.   Neuro/Psych PSYCHIATRIC DISORDERS Anxiety Depression  Neuromuscular disease negative neurological ROS  negative psych ROS   GI/Hepatic negative GI ROS, Neg liver ROS, GERD  Medicated and Controlled,  Endo/Other  negative endocrine ROSdiabetes, Well Controlled, Type 2, Oral Hypoglycemic Agents, Insulin DependentMorbid obesity  Renal/GU negative Renal ROS  negative genitourinary   Musculoskeletal negative musculoskeletal ROS (+)   Abdominal (+) + obese,   Peds negative pediatric ROS (+)  Hematology negative hematology ROS (+)   Anesthesia Other Findings   Reproductive/Obstetrics negative OB ROS                            Anesthesia Physical  Anesthesia Plan  ASA: IV  Anesthesia Plan: MAC   Post-op Pain Management:    Induction: Intravenous  PONV Risk Score and Plan:   Airway Management Planned: Nasal Cannula, Simple Face Mask and Mask  Additional Equipment:   Intra-op Plan:   Post-operative Plan:   Informed Consent: I have reviewed the patients History and Physical, chart, labs and discussed the procedure including the risks,  benefits and alternatives for the proposed anesthesia with the patient or authorized representative who has indicated his/her understanding and acceptance.     Dental advisory given  Plan Discussed with: CRNA, Surgeon and Anesthesiologist  Anesthesia Plan Comments:         Anesthesia Quick Evaluation

## 2019-07-22 DIAGNOSIS — G4733 Obstructive sleep apnea (adult) (pediatric): Secondary | ICD-10-CM | POA: Diagnosis not present

## 2019-07-23 ENCOUNTER — Encounter (HOSPITAL_COMMUNITY): Payer: Self-pay | Admitting: Gastroenterology

## 2019-07-23 NOTE — Anesthesia Postprocedure Evaluation (Signed)
Anesthesia Post Note  Patient: Joseph [name].  Procedure(s) Performed: COLONOSCOPY WITH PROPOFOL (N/A ) POLYPECTOMY     Patient location during evaluation: PACU Anesthesia Type: MAC Level of consciousness: awake and alert Pain management: pain level controlled Vital Signs Assessment: post-procedure vital signs reviewed and stable Respiratory status: spontaneous breathing Cardiovascular status: stable Anesthetic complications: no    Last Vitals:  Vitals:   07/20/19 1350 07/20/19 1400  BP: (!) 157/25 (!) 164/32  Pulse: 81 74  Resp: (!) 21 17  Temp:    SpO2: 100% 100%    Last Pain:  Vitals:   07/20/19 1410  TempSrc:   PainSc: 0-No pain                 Nolon Nations

## 2019-08-03 ENCOUNTER — Other Ambulatory Visit: Payer: Self-pay | Admitting: Gastroenterology

## 2019-08-03 DIAGNOSIS — R195 Other fecal abnormalities: Secondary | ICD-10-CM

## 2019-08-10 DIAGNOSIS — Z86711 Personal history of pulmonary embolism: Secondary | ICD-10-CM | POA: Diagnosis not present

## 2019-08-10 DIAGNOSIS — Z7901 Long term (current) use of anticoagulants: Secondary | ICD-10-CM | POA: Diagnosis not present

## 2019-08-22 DIAGNOSIS — G4733 Obstructive sleep apnea (adult) (pediatric): Secondary | ICD-10-CM | POA: Diagnosis not present

## 2019-09-12 DIAGNOSIS — F411 Generalized anxiety disorder: Secondary | ICD-10-CM | POA: Diagnosis not present

## 2019-09-12 DIAGNOSIS — Z7901 Long term (current) use of anticoagulants: Secondary | ICD-10-CM | POA: Diagnosis not present

## 2019-09-12 DIAGNOSIS — I10 Essential (primary) hypertension: Secondary | ICD-10-CM | POA: Diagnosis not present

## 2019-09-12 DIAGNOSIS — E7800 Pure hypercholesterolemia, unspecified: Secondary | ICD-10-CM | POA: Diagnosis not present

## 2019-09-12 DIAGNOSIS — K219 Gastro-esophageal reflux disease without esophagitis: Secondary | ICD-10-CM | POA: Diagnosis not present

## 2019-09-12 DIAGNOSIS — E1142 Type 2 diabetes mellitus with diabetic polyneuropathy: Secondary | ICD-10-CM | POA: Diagnosis not present

## 2019-09-17 DIAGNOSIS — Z23 Encounter for immunization: Secondary | ICD-10-CM | POA: Diagnosis not present

## 2019-09-17 DIAGNOSIS — E1142 Type 2 diabetes mellitus with diabetic polyneuropathy: Secondary | ICD-10-CM | POA: Diagnosis not present

## 2019-09-17 DIAGNOSIS — E7800 Pure hypercholesterolemia, unspecified: Secondary | ICD-10-CM | POA: Diagnosis not present

## 2019-09-17 DIAGNOSIS — I10 Essential (primary) hypertension: Secondary | ICD-10-CM | POA: Diagnosis not present

## 2019-09-17 DIAGNOSIS — Z86711 Personal history of pulmonary embolism: Secondary | ICD-10-CM | POA: Diagnosis not present

## 2019-09-17 DIAGNOSIS — Z7901 Long term (current) use of anticoagulants: Secondary | ICD-10-CM | POA: Diagnosis not present

## 2019-10-01 DIAGNOSIS — D509 Iron deficiency anemia, unspecified: Secondary | ICD-10-CM | POA: Diagnosis not present

## 2019-10-01 DIAGNOSIS — E7800 Pure hypercholesterolemia, unspecified: Secondary | ICD-10-CM | POA: Diagnosis not present

## 2019-10-01 DIAGNOSIS — I10 Essential (primary) hypertension: Secondary | ICD-10-CM | POA: Diagnosis not present

## 2019-10-01 DIAGNOSIS — E1140 Type 2 diabetes mellitus with diabetic neuropathy, unspecified: Secondary | ICD-10-CM | POA: Diagnosis not present

## 2019-10-01 DIAGNOSIS — E1142 Type 2 diabetes mellitus with diabetic polyneuropathy: Secondary | ICD-10-CM | POA: Diagnosis not present

## 2019-10-01 DIAGNOSIS — E1165 Type 2 diabetes mellitus with hyperglycemia: Secondary | ICD-10-CM | POA: Diagnosis not present

## 2019-10-01 DIAGNOSIS — Z794 Long term (current) use of insulin: Secondary | ICD-10-CM | POA: Diagnosis not present

## 2019-10-08 DIAGNOSIS — G4733 Obstructive sleep apnea (adult) (pediatric): Secondary | ICD-10-CM | POA: Diagnosis not present

## 2019-10-17 DIAGNOSIS — Z7901 Long term (current) use of anticoagulants: Secondary | ICD-10-CM | POA: Diagnosis not present

## 2019-10-17 DIAGNOSIS — Z86711 Personal history of pulmonary embolism: Secondary | ICD-10-CM | POA: Diagnosis not present

## 2019-11-14 DIAGNOSIS — Z7901 Long term (current) use of anticoagulants: Secondary | ICD-10-CM | POA: Diagnosis not present

## 2019-11-14 DIAGNOSIS — Z86711 Personal history of pulmonary embolism: Secondary | ICD-10-CM | POA: Diagnosis not present

## 2019-12-04 DIAGNOSIS — G4733 Obstructive sleep apnea (adult) (pediatric): Secondary | ICD-10-CM | POA: Diagnosis not present

## 2019-12-11 DIAGNOSIS — Z7901 Long term (current) use of anticoagulants: Secondary | ICD-10-CM | POA: Diagnosis not present

## 2019-12-11 DIAGNOSIS — Z86711 Personal history of pulmonary embolism: Secondary | ICD-10-CM | POA: Diagnosis not present

## 2019-12-14 DIAGNOSIS — G4733 Obstructive sleep apnea (adult) (pediatric): Secondary | ICD-10-CM | POA: Diagnosis not present

## 2020-01-08 DIAGNOSIS — Z7901 Long term (current) use of anticoagulants: Secondary | ICD-10-CM | POA: Diagnosis not present

## 2020-01-20 DIAGNOSIS — G4733 Obstructive sleep apnea (adult) (pediatric): Secondary | ICD-10-CM | POA: Diagnosis not present

## 2020-02-06 DIAGNOSIS — Z7901 Long term (current) use of anticoagulants: Secondary | ICD-10-CM | POA: Diagnosis not present

## 2020-02-06 DIAGNOSIS — Z86711 Personal history of pulmonary embolism: Secondary | ICD-10-CM | POA: Diagnosis not present

## 2020-02-15 ENCOUNTER — Emergency Department (HOSPITAL_COMMUNITY): Payer: PPO

## 2020-02-15 ENCOUNTER — Emergency Department (HOSPITAL_COMMUNITY)
Admission: EM | Admit: 2020-02-15 | Discharge: 2020-02-15 | Disposition: A | Discharge status: Home / Self Care | Payer: PPO | Attending: Emergency Medicine | Admitting: Emergency Medicine

## 2020-02-15 ENCOUNTER — Encounter (HOSPITAL_COMMUNITY): Payer: Self-pay | Admitting: Emergency Medicine

## 2020-02-15 DIAGNOSIS — L97519 Non-pressure chronic ulcer of other part of right foot with unspecified severity: Secondary | ICD-10-CM | POA: Diagnosis not present

## 2020-02-15 DIAGNOSIS — Z794 Long term (current) use of insulin: Secondary | ICD-10-CM | POA: Insufficient documentation

## 2020-02-15 DIAGNOSIS — L03031 Cellulitis of right toe: Secondary | ICD-10-CM | POA: Diagnosis not present

## 2020-02-15 DIAGNOSIS — M79674 Pain in right toe(s): Secondary | ICD-10-CM | POA: Diagnosis present

## 2020-02-15 DIAGNOSIS — E1151 Type 2 diabetes mellitus with diabetic peripheral angiopathy without gangrene: Secondary | ICD-10-CM | POA: Diagnosis not present

## 2020-02-15 DIAGNOSIS — E1142 Type 2 diabetes mellitus with diabetic polyneuropathy: Secondary | ICD-10-CM | POA: Diagnosis not present

## 2020-02-15 DIAGNOSIS — L089 Local infection of the skin and subcutaneous tissue, unspecified: Secondary | ICD-10-CM | POA: Diagnosis not present

## 2020-02-15 LAB — CBC WITH DIFFERENTIAL/PLATELET
Abs Immature Granulocytes: 0.02 10*3/uL (ref 0.00–0.07)
Basophils Absolute: 0.1 10*3/uL (ref 0.0–0.1)
Basophils Relative: 1 %
Eosinophils Absolute: 0.3 10*3/uL (ref 0.0–0.5)
Eosinophils Relative: 4 %
HCT: 44.3 % (ref 39.0–52.0)
Hemoglobin: 13.7 g/dL (ref 13.0–17.0)
Immature Granulocytes: 0 %
Lymphocytes Relative: 26 %
Lymphs Abs: 2 10*3/uL (ref 0.7–4.0)
MCH: 28.2 pg (ref 26.0–34.0)
MCHC: 30.9 g/dL (ref 30.0–36.0)
MCV: 91.2 fL (ref 80.0–100.0)
Monocytes Absolute: 0.6 10*3/uL (ref 0.1–1.0)
Monocytes Relative: 7 %
Neutro Abs: 4.8 10*3/uL (ref 1.7–7.7)
Neutrophils Relative %: 62 %
Platelets: 273 10*3/uL (ref 150–400)
RBC: 4.86 MIL/uL (ref 4.22–5.81)
RDW: 15.8 % — ABNORMAL HIGH (ref 11.5–15.5)
WBC: 7.7 10*3/uL (ref 4.0–10.5)
nRBC: 0 % (ref 0.0–0.2)

## 2020-02-15 LAB — COMPREHENSIVE METABOLIC PANEL
ALT: 13 U/L (ref 0–44)
AST: 17 U/L (ref 15–41)
Albumin: 3 g/dL — ABNORMAL LOW (ref 3.5–5.0)
Alkaline Phosphatase: 80 U/L (ref 38–126)
Anion gap: 12 (ref 5–15)
BUN: 30 mg/dL — ABNORMAL HIGH (ref 8–23)
CO2: 23 mmol/L (ref 22–32)
Calcium: 8.4 mg/dL — ABNORMAL LOW (ref 8.9–10.3)
Chloride: 102 mmol/L (ref 98–111)
Creatinine, Ser: 1.62 mg/dL — ABNORMAL HIGH (ref 0.61–1.24)
GFR calc Af Amer: 52 mL/min — ABNORMAL LOW (ref >60)
GFR calc non Af Amer: 45 mL/min — ABNORMAL LOW (ref >60)
Glucose, Bld: 97 mg/dL (ref 70–99)
Potassium: 4.1 mmol/L (ref 3.5–5.1)
Sodium: 137 mmol/L (ref 135–145)
Total Bilirubin: 0.4 mg/dL (ref 0.3–1.2)
Total Protein: 6.6 g/dL (ref 6.5–8.1)

## 2020-02-15 LAB — PROTIME-INR
INR: 2.6 — ABNORMAL HIGH (ref 0.8–1.2)
Prothrombin Time: 27.3 seconds — ABNORMAL HIGH (ref 11.4–15.2)

## 2020-02-15 LAB — LACTIC ACID, PLASMA
Lactic Acid, Venous: 2.4 mmol/L (ref 0.5–1.9)
Lactic Acid, Venous: 1.4 mmol/L (ref 0.5–1.9)

## 2020-02-15 MED ORDER — CLINDAMYCIN HCL 300 MG PO CAPS: 300.0000 mg | ORAL_CAPSULE | Freq: Three times a day (TID) | ORAL | 0 refills | Status: DC

## 2020-02-15 MED ORDER — CLINDAMYCIN PHOSPHATE 600 MG/50ML IV SOLN
600.0000 mg | Freq: Once | INTRAVENOUS | Status: AC
  Administered 2020-02-15: 600 mg via INTRAVENOUS
  Filled 2020-02-15: qty 50

## 2020-02-15 MED ORDER — SODIUM CHLORIDE 0.9% FLUSH
3.0000 mL | Freq: Once | INTRAVENOUS | Status: AC
  Administered 2020-02-15: 3 mL via INTRAVENOUS

## 2020-02-15 NOTE — Discharge Instructions (Signed)
You have a paronychia on the right big toe.  Please take clindamycin as prescribed   Please continue Epson salt soaks.  Please see podiatry for follow-up.  Return to ER if you have fever, severe foot pain, worsening redness.

## 2020-02-15 NOTE — ED Provider Notes (Signed)
[place] EMERGENCY DEPARTMENT Provider Note   CSN: [id number] Arrival date & time: 02/15/20  1704     History Chief Complaint  Patient presents with  . Wound Infection    Seldon Barrell. is a 64 y.o. male history of hypertension, known venous stasis ulcer, PE on Coumadin here presenting with right foot pain. Patient states that he has some neuropathy at baseline so he does not feel his toe that much.  He started having right big toe pain since yesterday. He is unclear if he injured his toe or not.  He noticed some drainage from the right toe.  Patient denies any fevers or chills.  The history is provided by the patient.       Past Medical History:  Diagnosis Date  . Anxiety state, unspecified   . Depression   . DVT (deep venous thrombosis) ([place]) 2004   Left knee  . Edema   . Esophageal reflux    occ  . Essential hypertension, benign   . Essential hypertension, benign   . Falls frequently   . History of iron deficiency anemia   . Morbid obesity ([place])   . Myalgia and myositis, unspecified    BACK AND LEGS  . OSA on CPAP   . Peripheral vascular disease, unspecified ([place])   . Personal history of PE (pulmonary embolism) 2004  . Polyneuropathy in diabetes(357.2)   . Poor venous access    PT STATES DIFFICULT TO DRAW BLOOD FROM HIS VEINS  . Pure hypercholesterolemia   . Shortness of breath    DUE TO WEIGHT AND LOSS OF MOBILITY  . Type II or unspecified type diabetes mellitus with neurological manifestations, not stated as uncontrolled(250.60)   . Type II or unspecified type diabetes mellitus without mention of complication, not stated as uncontrolled   . 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 Varicose veins of lower extremities with inflammation   . Wears glasses   . Wound infection after surgery    required hospitalization    Patient Active Problem List   Diagnosis Date Noted  . Acute  renal insufficiency 01/03/2015  . Esophageal reflux 01/02/2015  . Polyneuropathy in diabetes(357.2) 01/02/2015  . Abscess of postoperative wound of abdominal wall 01/02/2015  . Cellulitis and abscess of trunk 12/31/2014  . S/P IVC filter-temporary REMOVED   . Status post panniculectomy Oct 2015 07/17/2014  . Morbid obesity BMI 65 03/22/2014  . Pure hypercholesterolemia 03/22/2014  . Essential hypertension, benign 03/22/2014  . Diabetes ([place]) 03/22/2014  . Panniculitis 03/22/2014  . OSA (obstructive sleep apnea) 03/22/2014  . Hx pulmonary embolism 03/22/2014    Past Surgical History:  Procedure Laterality Date  . COLONOSCOPY WITH PROPOFOL N/A 07/20/2019   Procedure: COLONOSCOPY WITH PROPOFOL;  Surgeon: Wonda Horner, MD;  Location: [facility] ENDOSCOPY;  Service: Endoscopy;  Laterality: N/A;  . NO PAST SURGERIES    . PANNICULECTOMY N/A 07/15/2014   Procedure: PANNICULECTOMY;  Surgeon: Pedro Earls, MD;  Location: [facility] ORS;  Service: General;  Laterality: N/A;  . POLYPECTOMY  07/20/2019   Procedure: POLYPECTOMY;  Surgeon: Wonda Horner, MD;  Location: [facility] ENDOSCOPY;  Service: Endoscopy;;       No family history on file.  Social History   Tobacco Use  . Smoking status: Never Smoker  . Smokeless tobacco: Never Used  Substance Use Topics  . Alcohol use: Not Currently  . Drug use: No    Home  Medications Prior to Admission medications   Medication Sig Start Date End Date Taking? Authorizing Provider  acetaminophen (TYLENOL) 500 MG tablet Take 1,000 mg by mouth daily as needed for moderate pain or headache.     [provider]  amLODipine (NORVASC) 10 MG tablet Take 10 mg by mouth every evening.     [provider]  buPROPion (WELLBUTRIN XL) 150 MG 24 hr tablet Take 150 mg by mouth daily. 10/20/15   [provider]  FLUoxetine (PROZAC) 40 MG capsule Take 40 mg by mouth every evening.     [provider]  furosemide (LASIX) 20 MG tablet Take 20 mg  by mouth daily.     [provider]  indapamide (LOZOL) 2.5 MG tablet Take 5 mg by mouth daily.     [provider]  insulin lispro (HUMALOG) 100 UNIT/ML injection Inject 5-30 Units into the skin 3 (three) times daily with meals.    [provider]  insulin NPH Human (NOVOLIN N) 100 UNIT/ML injection Inject 30 Units into the skin 2 (two) times daily.    [provider]  metFORMIN (GLUCOPHAGE) 1000 MG tablet Take 1,000 mg by mouth 2 (two) times daily with a meal.    [provider]  omeprazole (PRILOSEC) 40 MG capsule Take 40 mg by mouth daily as needed (acid reflux).     [provider]  pioglitazone (ACTOS) 45 MG tablet Take 45 mg by mouth daily.    [provider]  pravastatin (PRAVACHOL) 40 MG tablet Take 40 mg by mouth every evening.     [provider]  Prenatal Vit-Fe Fumarate-FA (PRENATAL FA PO) Take 1 tablet by mouth daily.    [provider]  quinapril (ACCUPRIL) 20 MG tablet Take 20 mg by mouth daily.     [provider]  warfarin (COUMADIN) 5 MG tablet Take 5-7.5 mg by mouth at bedtime. Take 5 mg in the evening on Sun, Mon, Wed, and Fri. Take 7.5 mg in the evening on Tue, Thur, and Sat    [provider]  Wheat Dextrin (BENEFIBER) POWD Take 15 mLs by mouth daily as needed (constipation). Mix in coffee and drink    [provider]    Allergies    Tape  Review of Systems   Review of Systems  Musculoskeletal:       R big toe pain   All other systems reviewed and are negative.   Physical Exam Updated Vital Signs BP (!) 139/44 (BP Location: Right Arm)   Pulse 80   Temp 99 F (37.2 C) (Oral)   Resp 18   Ht 6\' 1"  (1.854 m)   Wt (!) 215.5 kg   SpO2 98%   BMI 62.67 kg/m   Physical Exam Vitals and nursing note reviewed.  HENT:     Head: Normocephalic.     Nose: Nose normal.     Mouth/Throat:     Mouth: Mucous membranes are moist.  Eyes:     Extraocular Movements:  Extraocular movements intact.     Pupils: Pupils are equal, round, and reactive to light.  Cardiovascular:     Rate and Rhythm: Normal rate.     Pulses: Normal pulses.  Pulmonary:     Effort: Pulmonary effort is normal.  Abdominal:     General: Abdomen is flat.  Musculoskeletal:     Cervical back: Normal range of motion.     Comments: Venous stasis changes in the right lower extremities.  He does have swelling around the right big toenail and there is some fluctuance consistent with a paronychia.  The paronychia is already self draining.  He does have faint right DP pulse.  Skin:    General: Skin is warm.     Capillary Refill: Capillary refill takes less than 2 seconds.  Neurological:     General: No focal deficit present.     Mental Status: He is alert and oriented to person, place, and time.  Psychiatric:        Mood and Affect: Mood normal.        Behavior: Behavior normal.       ED Results / Procedures / Treatments   Labs (all labs ordered are listed, but only abnormal results are displayed) Labs Reviewed  LACTIC ACID, PLASMA - Abnormal; Notable for the following components:      Result Value   Lactic Acid, Venous 2.4 (*)    All other components within normal limits  COMPREHENSIVE METABOLIC PANEL - Abnormal; Notable for the following components:   BUN 30 (*)    Creatinine, Ser 1.62 (*)    Calcium 8.4 (*)    Albumin 3.0 (*)    GFR calc non Af Amer 45 (*)    GFR calc Af Amer 52 (*)    All other components within normal limits  CBC WITH DIFFERENTIAL/PLATELET - Abnormal; Notable for the following components:   RDW 15.8 (*)    All other components within normal limits  PROTIME-INR - Abnormal; Notable for the following components:   Prothrombin Time 27.3 (*)    INR 2.6 (*)    All other components within normal limits  LACTIC ACID, PLASMA    EKG None  Radiology DG Foot Complete Right  Result Date: 02/15/2020 CLINICAL DATA:  Right foot pain, neuropathy, diabetes,  right great toe ulcer EXAM: RIGHT FOOT COMPLETE - 3+ VIEW COMPARISON:  None. FINDINGS: Frontal, oblique, lateral views of the right foot are obtained. There is subcutaneous gas within the dorsum of the right great toe consistent with soft tissue ulceration. There is no underlying bony destruction or periosteal reaction to suggest osteomyelitis. The bones are osteopenic. Hammertoe deformities are seen of the second through fifth digits. No acute displaced fracture. Prior healed fifth metatarsal fracture. Diffuse vascular calcifications are seen. IMPRESSION: 1. Soft tissue ulceration first digit, with no radiographic evidence of osteomyelitis. 2. Osteopenia. Electronically Signed   By: Randa Ngo M.D.   On: 02/15/2020 22:09    Procedures Procedures (including critical care time)  Medications Ordered in ED Medications  sodium chloride flush (NS) 0.9 % injection 3 mL (3 mLs Intravenous Given 02/15/20 2218)  clindamycin (CLEOCIN) IVPB 600 mg (0 mg Intravenous Stopped 02/15/20 2302)    ED Course  I have reviewed the triage vital signs and the nursing notes.  Pertinent labs & imaging results that were available during my care of the patient were reviewed by me and considered in my medical decision making (see chart for details).    MDM Rules/Calculators/A&P                      Esteban Kobashigawa. is a 64 y.o. male presenting with right big toe pain and swelling. Clinically, patient has a paronychia but it is draining already.  I think is just localized to the right big toe and no cellulitis of the rest of the foot.  He does have some venous stasis changes.  Will get a  CBC and chemistry and lactate and cultures.  We will also get right foot x-ray to rule out osteomyelitis.  Will give clindamycin empirically.  11:14 PM Initial lactate was 2.4 and went down to 1.4.  His INR is 2.6.  His white blood cell count is normal and his foot x-ray showed no osteomyelitis.  He was given clindamycin and will refer  to podiatry.  The paronychia is draining already.  Recommend some warm soaks.  Final Clinical Impression(s) / ED Diagnoses Final diagnoses:  None    Rx / DC Orders ED Discharge Orders    None       Drenda Freeze, MD 02/15/20 2314

## 2020-02-15 NOTE — ED Triage Notes (Signed)
Patient in POV. Sent by PCP for further eval of diabetic ulcer to R toe. Present X3 days. Sent for possible admission and IV antibiotics.

## 2020-02-15 NOTE — ED Notes (Signed)
Patient verbalizes understanding of discharge instructions. Opportunity for questioning and answers were provided. Armband removed by staff, pt discharged from ED in wheelchair to home.   

## 2020-02-19 ENCOUNTER — Other Ambulatory Visit: Payer: Self-pay

## 2020-02-19 ENCOUNTER — Ambulatory Visit: Payer: PPO | Admitting: Sports Medicine

## 2020-02-19 ENCOUNTER — Encounter: Payer: Self-pay | Admitting: Sports Medicine

## 2020-02-19 ENCOUNTER — Telehealth: Payer: Self-pay | Admitting: *Deleted

## 2020-02-19 VITALS — Temp 97.9°F

## 2020-02-19 DIAGNOSIS — L97511 Non-pressure chronic ulcer of other part of right foot limited to breakdown of skin: Secondary | ICD-10-CM

## 2020-02-19 DIAGNOSIS — I83019 Varicose veins of right lower extremity with ulcer of unspecified site: Secondary | ICD-10-CM

## 2020-02-19 DIAGNOSIS — I872 Venous insufficiency (chronic) (peripheral): Secondary | ICD-10-CM | POA: Diagnosis not present

## 2020-02-19 DIAGNOSIS — I739 Peripheral vascular disease, unspecified: Secondary | ICD-10-CM | POA: Insufficient documentation

## 2020-02-19 DIAGNOSIS — E1142 Type 2 diabetes mellitus with diabetic polyneuropathy: Secondary | ICD-10-CM | POA: Diagnosis not present

## 2020-02-19 DIAGNOSIS — L97919 Non-pressure chronic ulcer of unspecified part of right lower leg with unspecified severity: Secondary | ICD-10-CM

## 2020-02-19 DIAGNOSIS — E1165 Type 2 diabetes mellitus with hyperglycemia: Secondary | ICD-10-CM | POA: Insufficient documentation

## 2020-02-19 DIAGNOSIS — Z5181 Encounter for therapeutic drug level monitoring: Secondary | ICD-10-CM | POA: Insufficient documentation

## 2020-02-19 DIAGNOSIS — G4733 Obstructive sleep apnea (adult) (pediatric): Secondary | ICD-10-CM | POA: Diagnosis not present

## 2020-02-19 DIAGNOSIS — S90422A Blister (nonthermal), left great toe, initial encounter: Secondary | ICD-10-CM

## 2020-02-19 DIAGNOSIS — Z7901 Long term (current) use of anticoagulants: Secondary | ICD-10-CM | POA: Insufficient documentation

## 2020-02-19 DIAGNOSIS — M797 Fibromyalgia: Secondary | ICD-10-CM | POA: Insufficient documentation

## 2020-02-19 DIAGNOSIS — I8310 Varicose veins of unspecified lower extremity with inflammation: Secondary | ICD-10-CM | POA: Insufficient documentation

## 2020-02-19 DIAGNOSIS — F411 Generalized anxiety disorder: Secondary | ICD-10-CM | POA: Insufficient documentation

## 2020-02-19 DIAGNOSIS — R609 Edema, unspecified: Secondary | ICD-10-CM | POA: Insufficient documentation

## 2020-02-19 DIAGNOSIS — IMO0002 Reserved for concepts with insufficient information to code with codable children: Secondary | ICD-10-CM | POA: Insufficient documentation

## 2020-02-19 NOTE — Progress Notes (Signed)
Subjective: Joseph [name]. is a 64 y.o. male patient seen in office for evaluation of ulceration of the right great toe. Patient has a history of diabetes and a blood glucose level today not recorded  Patient is changing the dressing using epsom salt soaks and bactin spray. Reports wen to ED on 5/21 and had Xrays and was given clindamycin and also antibiotics by PCP at [facility] of which he picked up and is taking. Reports that he feels like there is something digging into right 1st toe. Denies nausea/fever/vomiting/chills/night sweats/shortness of breath/pain, has neuropathy so did not feel when this started but did tell his wife on Friday. Patient has no other pedal complaints at this time.  Review of Systems  All other systems reviewed and are negative.    Patient Active Problem List   Diagnosis Date Noted  . Monitoring for anticoagulant use 02/19/2020  . Accumulation of fluid in tissues 02/19/2020  . Anxiety state 02/19/2020  . Diabetes mellitus type 2, uncontrolled ([place]) 02/19/2020  . Diabetes mellitus with polyneuropathy ([place]) 02/19/2020  . Fibrositis 02/19/2020  . Peripheral blood vessel disorder ([place]) 02/19/2020  . Varicose veins of lower extremities with inflammation 02/19/2020  . Acute renal insufficiency 01/03/2015  . Esophageal reflux 01/02/2015  . Polyneuropathy in diabetes(357.2) 01/02/2015  . Abscess of postoperative wound of abdominal wall 01/02/2015  . Cellulitis and abscess of trunk 12/31/2014  . S/P IVC filter-temporary REMOVED   . Status post panniculectomy Oct 2015 07/17/2014  . Morbid obesity BMI 65 03/22/2014  . Pure hypercholesterolemia 03/22/2014  . Essential hypertension, benign 03/22/2014  . Diabetes ([place]) 03/22/2014  . Panniculitis 03/22/2014  . OSA (obstructive sleep apnea) 03/22/2014  . Hx pulmonary embolism 03/22/2014  . Diabetic neuropathy ([place]) 02/20/2014   Current Outpatient Medications on File Prior to Visit  Medication Sig Dispense Refill  .  acetaminophen (TYLENOL) 500 MG tablet Take 1,000 mg by mouth daily as needed for moderate pain or headache.     Marland Kitchen amLODipine (NORVASC) 10 MG tablet Take 10 mg by mouth every evening.     Marland Kitchen buPROPion (WELLBUTRIN XL) 150 MG 24 hr tablet Take 150 mg by mouth daily.  1  . clindamycin (CLEOCIN) 300 MG capsule Take 1 capsule (300 mg total) by mouth 3 (three) times daily. 21 capsule 0  . FLUoxetine (PROZAC) 40 MG capsule Take 40 mg by mouth every evening.     . furosemide (LASIX) 20 MG tablet Take 20 mg by mouth daily.     . indapamide (LOZOL) 2.5 MG tablet Take 5 mg by mouth daily.     . insulin lispro (HUMALOG) 100 UNIT/ML injection Inject 5-30 Units into the skin 3 (three) times daily with meals.    . insulin NPH Human (NOVOLIN N) 100 UNIT/ML injection Inject 30 Units into the skin 2 (two) times daily.    . metFORMIN (GLUCOPHAGE) 1000 MG tablet Take 1,000 mg by mouth 2 (two) times daily with a meal.    . omeprazole (PRILOSEC) 40 MG capsule Take 40 mg by mouth daily as needed (acid reflux).     . pioglitazone (ACTOS) 45 MG tablet Take 45 mg by mouth daily.    . pravastatin (PRAVACHOL) 40 MG tablet Take 40 mg by mouth every evening.     . Prenatal Vit-Fe Fumarate-FA (PRENATAL FA PO) Take 1 tablet by mouth daily.    . quinapril (ACCUPRIL) 20 MG tablet Take 20 mg by mouth daily.     Marland Kitchen warfarin (COUMADIN) 5 MG  tablet Take 5-7.5 mg by mouth at bedtime. Take 5 mg in the evening on Sun, Mon, Wed, and Fri. Take 7.5 mg in the evening on Tue, Thur, and Sat    . Wheat Dextrin (BENEFIBER) POWD Take 15 mLs by mouth daily as needed (constipation). Mix in coffee and drink    . FREESTYLE LITE test strip TEST BLOOD SUGAR FOUR TIMES DAILY    . insulin aspart (NOVOLOG) 100 UNIT/ML injection SMARTSIG:5-20 Unit(s) SUB-Q As Directed     No current facility-administered medications on file prior to visit.   Allergies  Allergen Reactions  . Tape     Peels skin on legs    Recent Results (from the past 2160 hour(s))   Lactic acid, plasma     Status: Abnormal   Collection Time: 02/15/20  5:32 PM  Result Value Ref Range   Lactic Acid, Venous 2.4 (HH) 0.5 - 1.9 mmol/L    Comment: CRITICAL RESULT CALLED TO, READ BACK BY AND VERIFIED WITH: T PHILLIPS,RN 1813 02/15/2020 WBOND Performed at [facility], [place] [address], [place], [place] [address]   Comprehensive metabolic panel     Status: Abnormal   Collection Time: 02/15/20  5:32 PM  Result Value Ref Range   Sodium 137 135 - 145 mmol/L   Potassium 4.1 3.5 - 5.1 mmol/L   Chloride 102 98 - 111 mmol/L   CO2 23 22 - 32 mmol/L   Glucose, Bld 97 70 - 99 mg/dL    Comment: Glucose reference range applies only to samples taken after fasting for at least 8 hours.   BUN 30 (H) 8 - 23 mg/dL   Creatinine, Ser 1.62 (H) 0.61 - 1.24 mg/dL   Calcium 8.4 (L) 8.9 - 10.3 mg/dL   Total Protein 6.6 6.5 - 8.1 g/dL   Albumin 3.0 (L) 3.5 - 5.0 g/dL   AST 17 15 - 41 U/L   ALT 13 0 - 44 U/L   Alkaline Phosphatase 80 38 - 126 U/L   Total Bilirubin 0.4 0.3 - 1.2 mg/dL   GFR calc non Af Amer 45 (L) >60 mL/min   GFR calc Af Amer 52 (L) >60 mL/min   Anion gap 12 5 - 15    Comment: Performed at [facility] [address], [place], [place] [address]  CBC with Differential     Status: Abnormal   Collection Time: 02/15/20  5:32 PM  Result Value Ref Range   WBC 7.7 4.0 - 10.5 K/uL   RBC 4.86 4.22 - 5.81 MIL/uL   Hemoglobin 13.7 13.0 - 17.0 g/dL   HCT 44.3 39.0 - 52.0 %   MCV 91.2 80.0 - 100.0 fL   MCH 28.2 26.0 - 34.0 pg   MCHC 30.9 30.0 - 36.0 g/dL   RDW 15.8 (H) 11.5 - 15.5 %   Platelets 273 150 - 400 K/uL   nRBC 0.0 0.0 - 0.2 %   Neutrophils Relative % 62 %   Neutro Abs 4.8 1.7 - 7.7 K/uL   Lymphocytes Relative 26 %   Lymphs Abs 2.0 0.7 - 4.0 K/uL   Monocytes Relative 7 %   Monocytes Absolute 0.6 0.1 - 1.0 K/uL   Eosinophils Relative 4 %   Eosinophils Absolute 0.3 0.0 - 0.5 K/uL   Basophils Relative 1 %   Basophils Absolute 0.1 0.0 - 0.1 K/uL    Immature Granulocytes 0 %   Abs Immature Granulocytes 0.02 0.00 - 0.07 K/uL    Comment:  Performed at [facility], [place] [address] [place], [place] [address]  Lactic acid, plasma     Status: None   Collection Time: 02/15/20 10:06 PM  Result Value Ref Range   Lactic Acid, Venous 1.4 0.5 - 1.9 mmol/L    Comment: Performed at [facility] [address] [place], [place] [address]  Protime-INR     Status: Abnormal   Collection Time: 02/15/20 10:06 PM  Result Value Ref Range   Prothrombin Time 27.3 (H) 11.4 - 15.2 seconds   INR 2.6 (H) 0.8 - 1.2    Comment: (NOTE) INR goal varies based on device and disease states. Performed at [facility], [place] [address] [place], [place] [address]     Objective: There were no vitals filed for this visit.  General: Patient is awake, alert, oriented x 3 and in no acute distress.  Dermatology: Skin is warm and dry bilateral with a partial thickness ulceration present distal tuft of right great toe with deroofed loose macerated blistered skin. Ulceration measures 2 cm x 2 cm x 0.1cm. There is a  Macerated border with a granular  base. The ulceration does not probe to bone.  There are small nail fragments on the right great toenail. There is no malodor, clear active drainage, focal erythema, focal edema.   There is a dry blood blister to left hallux distal tuft. No acute signs of infection.   There are chronic venous skin changes bilateral legs with a blister to right lateral leg.  Vascular: Dorsalis Pedis pulse = 0/4 Bilateral,  Posterior Tibial pulse = 0/4 Bilateral,  Capillary Fill Time < 5 seconds, due to chronic skin changes/lymphedema   Neurologic: Protective sensation absent bilateral.   Musculosketal: No Pain with palpation to ulcerated area. No pain with compression to calves bilateral.   No results for input(s): GRAMSTAIN, LABORGA in the last 8760 hours.  Assessment and Plan:  Problem List Items Addressed This Visit       Endocrine   Diabetes mellitus with polyneuropathy ([place])   Relevant Medications   insulin aspart (NOVOLOG) 100 UNIT/ML injection    Other Visit Diagnoses    Toe ulcer, right, limited to breakdown of skin ([place])    -  Primary   Blister of left great toe, initial encounter       Chronic venous stasis dermatitis of both lower extremities       Venous ulcer of right leg ([place])         -Examined patient and discussed the progression of the wound and treatment alternatives. -Xrays reviewed from ER - Excisionally dedbrided loose skin to healthy bleeding borders removing nonviable tissue using a sterile tissue nipper very gently. Wound measures post debridement as above. Wound was debrided to the level of the dermis with viable wound base exposed to promote healing. Hemostasis was achieved with manuel pressure. Patient tolerated procedure well without any discomfort or anesthesia necessary for this wound debridement.  -Applied betadine and dry sterile dressing and instructed patient to continue with daily dressings at home consisting of on the right and also protective gauze dressing to left big toe -Continue with antibiotic cream and compressive wrap to right leg until he can be seen by wound center -Continue with antibiotics -Rx ABIs and Venous duplex  - Advised patient to go to the ER or return to office if the wound worsens or if constitutional symptoms are present. -Patient to return to office in 1 week for follow  up care and evaluation or sooner if problems arise.  Landis Martins, DPM

## 2020-02-19 NOTE — Telephone Encounter (Signed)
-----   Message from Landis Martins, [place] sent at 02/19/2020 11:10 AM EDT ----- Regarding: vascular testing and refer to wound center ABI and PVR and Venous Duplex Mix of venous skin changes that are chronic to the legs and new toe ulcer on right  Wound care center referral for venous skin changes to legs and a blister on right lateral leg.

## 2020-02-19 NOTE — Telephone Encounter (Signed)
Faxed required form, clinicals and demographics to [place] and [facility].

## 2020-02-21 ENCOUNTER — Other Ambulatory Visit: Payer: Self-pay

## 2020-02-21 ENCOUNTER — Ambulatory Visit (HOSPITAL_COMMUNITY)
Admission: RE | Admit: 2020-02-21 | Discharge: 2020-02-21 | Disposition: A | Discharge status: Home / Self Care | Payer: PPO | Source: Ambulatory Visit | Attending: Sports Medicine | Admitting: Sports Medicine

## 2020-02-21 ENCOUNTER — Ambulatory Visit (INDEPENDENT_AMBULATORY_CARE_PROVIDER_SITE_OTHER)
Admission: RE | Admit: 2020-02-21 | Discharge: 2020-02-21 | Disposition: A | Discharge status: Home / Self Care | Payer: PPO | Source: Ambulatory Visit | Attending: Sports Medicine | Admitting: Sports Medicine

## 2020-02-21 DIAGNOSIS — L97919 Non-pressure chronic ulcer of unspecified part of right lower leg with unspecified severity: Secondary | ICD-10-CM | POA: Insufficient documentation

## 2020-02-21 DIAGNOSIS — S90422A Blister (nonthermal), left great toe, initial encounter: Secondary | ICD-10-CM

## 2020-02-21 DIAGNOSIS — I83019 Varicose veins of right lower extremity with ulcer of unspecified site: Secondary | ICD-10-CM | POA: Insufficient documentation

## 2020-02-21 DIAGNOSIS — I872 Venous insufficiency (chronic) (peripheral): Secondary | ICD-10-CM | POA: Insufficient documentation

## 2020-02-21 DIAGNOSIS — L97511 Non-pressure chronic ulcer of other part of right foot limited to breakdown of skin: Secondary | ICD-10-CM | POA: Diagnosis not present

## 2020-02-22 ENCOUNTER — Other Ambulatory Visit: Payer: Self-pay | Admitting: Sports Medicine

## 2020-02-22 ENCOUNTER — Telehealth: Payer: Self-pay | Admitting: *Deleted

## 2020-02-22 DIAGNOSIS — L97511 Non-pressure chronic ulcer of other part of right foot limited to breakdown of skin: Secondary | ICD-10-CM

## 2020-02-22 DIAGNOSIS — S90422A Blister (nonthermal), left great toe, initial encounter: Secondary | ICD-10-CM

## 2020-02-22 DIAGNOSIS — I872 Venous insufficiency (chronic) (peripheral): Secondary | ICD-10-CM

## 2020-02-22 DIAGNOSIS — I83019 Varicose veins of right lower extremity with ulcer of unspecified site: Secondary | ICD-10-CM

## 2020-02-22 DIAGNOSIS — L97919 Non-pressure chronic ulcer of unspecified part of right lower leg with unspecified severity: Secondary | ICD-10-CM

## 2020-02-22 DIAGNOSIS — E1142 Type 2 diabetes mellitus with diabetic polyneuropathy: Secondary | ICD-10-CM

## 2020-02-22 MED ORDER — CLINDAMYCIN HCL 300 MG PO CAPS: 300.0000 mg | ORAL_CAPSULE | Freq: Three times a day (TID) | ORAL | 0 refills | Status: DC

## 2020-02-22 NOTE — Telephone Encounter (Signed)
Pt called states he does not have enough antibiotic to get him to the appt with Dr. Cannon Kettle and it was prescribed by DR. Darl Householder. I told pt I could not refill rx from another doctor office, but would send the message to Dr. Cannon Kettle.

## 2020-02-22 NOTE — Telephone Encounter (Signed)
[facility] - Joseph [name] states preliminary results show no DVT from groin to knee, calves too large to visualize, ABI non-compressible, triphasic.

## 2020-02-22 NOTE — Telephone Encounter (Signed)
Pt called and states he got a call this morning and was supposed to go back in for testing. I told pt that his testing was abnormal and Dr. Cannon Kettle wanted him to be evaluated for intervention. Pt states understanding and I gave him the [facility] [phone].

## 2020-02-22 NOTE — Telephone Encounter (Signed)
I spoke with pt's wife, Benjamine Mola and she states pt is still asleep he did not sleep well due to the pain in his legs. I informed Benjamine Mola, of Dr. Leeanne Rio review of results and referral to [facility], and instructed her to take pt to the ED if he had increase swelling, hard painful legs or redness or fever in the legs. Benjamine Mola stated understanding. Faxed required form, clinicals and demographics to [facility].

## 2020-02-22 NOTE — Telephone Encounter (Signed)
-----   Message from Landis Martins, [place] sent at 02/21/2020  2:56 PM EDT ----- Non-compressible ABIs. Please refer patient to vascular for follow up since he has a toe ulcer.

## 2020-02-22 NOTE — Telephone Encounter (Signed)
Refill sent.

## 2020-02-22 NOTE — Progress Notes (Signed)
Refill sent.

## 2020-02-26 ENCOUNTER — Ambulatory Visit (INDEPENDENT_AMBULATORY_CARE_PROVIDER_SITE_OTHER): Payer: PPO | Admitting: Vascular Surgery

## 2020-02-26 ENCOUNTER — Other Ambulatory Visit: Payer: Self-pay | Admitting: Sports Medicine

## 2020-02-26 ENCOUNTER — Telehealth: Payer: Self-pay | Admitting: *Deleted

## 2020-02-26 ENCOUNTER — Other Ambulatory Visit: Payer: Self-pay

## 2020-02-26 ENCOUNTER — Encounter: Payer: Self-pay | Admitting: Vascular Surgery

## 2020-02-26 VITALS — BP 161/71 | HR 82 | Temp 97.2°F | Resp 20 | Ht 73.0 in | Wt >= 6400 oz

## 2020-02-26 DIAGNOSIS — I83009 Varicose veins of unspecified lower extremity with ulcer of unspecified site: Secondary | ICD-10-CM | POA: Diagnosis not present

## 2020-02-26 DIAGNOSIS — L97909 Non-pressure chronic ulcer of unspecified part of unspecified lower leg with unspecified severity: Secondary | ICD-10-CM

## 2020-02-26 MED ORDER — CLINDAMYCIN HCL 300 MG PO CAPS: 300.0000 mg | ORAL_CAPSULE | Freq: Three times a day (TID) | ORAL | 0 refills | Status: DC

## 2020-02-26 MED ORDER — SILVER SULFADIAZINE 1 % EX CREA: 1.0000 "application " | TOPICAL_CREAM | Freq: Every day | CUTANEOUS | 0 refills | Status: AC

## 2020-02-26 NOTE — Progress Notes (Signed)
Clindamycin has been refilled

## 2020-02-26 NOTE — Progress Notes (Signed)
[facility] [place]  Patient name: Joseph [name]. MRN: [id number] DOB: 02/01/1956 Sex: male  REASON FOR CONSULT: Evaluation bilateral toe ulceration and also bilateral leg ulceration.  HPI: Joseph [name]. is a 64 y.o. male, who is here today for evaluation of adequacy of arterial flow to both lower extremities.  He is here today with his wife.  He has a complex past history.  He has extreme obesity with BMI of 62.  He weighs 472 pounds.  Has a long history of chronic venous stasis disease.  Had been in treatment for venous ulcers in the past.  Recently he has developed an ulceration over the tip of his right great toe and also some blistering on his left great toe.  He has a long history of diabetes and was concern regarding adequacy of arterial flow.  He has no claudication and no rest pain.  On questioning he did get a new pair of clogs prior to having the blistering.  He currently is wearing open toe shoes to not put pressure on his toes.  Past Medical History:  Diagnosis Date   Anxiety state, unspecified    Depression    DVT (deep venous thrombosis) ([place]) 2004   Left knee   Edema    Esophageal reflux    occ   Essential hypertension, benign    Essential hypertension, benign    Falls frequently    History of iron deficiency anemia    Morbid obesity (HCC)    Myalgia and myositis, unspecified    BACK AND LEGS   OSA on CPAP    Peripheral vascular disease, unspecified (HCC)    Personal history of PE (pulmonary embolism) 2004   Polyneuropathy in diabetes(357.2)    Poor venous access    PT STATES DIFFICULT TO DRAW BLOOD FROM HIS VEINS   Pure hypercholesterolemia    Shortness of breath    DUE TO WEIGHT AND LOSS OF MOBILITY   Type II or unspecified type diabetes mellitus with neurological manifestations, not stated as uncontrolled(250.60)    Type II or unspecified type diabetes mellitus without mention of  complication, not stated as uncontrolled    Type II or unspecified type diabetes mellitus without mention of complication, uncontrolled    Varicose veins of lower extremities with inflammation    Wears glasses    Wound infection after surgery    required hospitalization    History reviewed. No pertinent family history.  SOCIAL HISTORY: Social History   Socioeconomic History   Marital status: Married    Spouse name: Not on file   Number of children: Not on file   Years of education: Not on file   Highest education level: Not on file  Occupational History   Not on file  Tobacco Use   Smoking status: Never Smoker   Smokeless tobacco: Never Used  Substance and Sexual Activity   Alcohol use: Not Currently   Drug use: No   Sexual activity: Not on file  Other Topics Concern   Not on file  Social History Narrative   Not on file   Social Determinants of Health   Financial Resource Strain:    Difficulty of Paying Living Expenses:   Food Insecurity:    Worried About [place] in the Last Year:    [place] in the Last Year:   Transportation Needs:    Film/video editor (Medical):    Lack of Transportation (  Non-Medical):   Physical Activity:    Days of Exercise per Week:    Minutes of Exercise per Session:   Stress:    Feeling of Stress :   Social Connections:    Frequency of Communication with Friends and Family:    Frequency of Social Gatherings with Friends and Family:    Attends Religious Services:    Active Member of Clubs or Organizations:    Attends Archivist Meetings:    Marital Status:   Intimate Partner Violence:    Fear of Current or Ex-Partner:    Emotionally Abused:    Physically Abused:    Sexually Abused:     Allergies  Allergen Reactions   Tape     Peels skin on legs    Current Outpatient Medications  Medication Sig Dispense Refill   acetaminophen (TYLENOL) 500 MG tablet Take  1,000 mg by mouth daily as needed for moderate pain or headache.      amLODipine (NORVASC) 10 MG tablet Take 10 mg by mouth every evening.      buPROPion (WELLBUTRIN XL) 150 MG 24 hr tablet Take 150 mg by mouth daily.  1   clindamycin (CLEOCIN) 300 MG capsule Take 1 capsule (300 mg total) by mouth 3 (three) times daily. 21 capsule 0   FLUoxetine (PROZAC) 40 MG capsule Take 40 mg by mouth every evening.      FREESTYLE LITE test strip TEST BLOOD SUGAR FOUR TIMES DAILY     furosemide (LASIX) 20 MG tablet Take 20 mg by mouth daily.      indapamide (LOZOL) 2.5 MG tablet Take 5 mg by mouth daily.      insulin aspart (NOVOLOG) 100 UNIT/ML injection SMARTSIG:5-20 Unit(s) SUB-Q As Directed     insulin lispro (HUMALOG) 100 UNIT/ML injection Inject 5-30 Units into the skin 3 (three) times daily with meals.     insulin NPH Human (NOVOLIN N) 100 UNIT/ML injection Inject 30 Units into the skin 2 (two) times daily.     metFORMIN (GLUCOPHAGE) 1000 MG tablet Take 1,000 mg by mouth 2 (two) times daily with a meal.     omeprazole (PRILOSEC) 40 MG capsule Take 40 mg by mouth daily as needed (acid reflux).      pioglitazone (ACTOS) 45 MG tablet Take 45 mg by mouth daily.     pravastatin (PRAVACHOL) 40 MG tablet Take 40 mg by mouth every evening.      Prenatal Vit-Fe Fumarate-FA (PRENATAL FA PO) Take 1 tablet by mouth daily.     quinapril (ACCUPRIL) 20 MG tablet Take 20 mg by mouth daily.      warfarin (COUMADIN) 5 MG tablet Take 5-7.5 mg by mouth at bedtime. Take 5 mg in the evening on Sun, Mon, Wed, and Fri. Take 7.5 mg in the evening on Tue, Thur, and Sat     Wheat Dextrin (BENEFIBER) POWD Take 15 mLs by mouth daily as needed (constipation). Mix in coffee and drink     silver sulfADIAZINE (SILVADENE) 1 % cream Apply 1 application topically daily. 50 g 0   No current facility-administered medications for this visit.    REVIEW OF SYSTEMS:  [X]  denotes positive finding, [ ]  denotes negative  finding Cardiac  Comments:  Chest pain or chest pressure:    Shortness of breath upon exertion: x   Short of breath when lying flat:    Irregular heart rhythm: x       Vascular    Pain in calf, thigh,  or hip brought on by ambulation:    Pain in feet at night that wakes you up from your sleep:     Blood clot in your veins:    Leg swelling:         Pulmonary    Oxygen at home:    Productive cough:     Wheezing:         Neurologic    Sudden weakness in arms or legs:     Sudden numbness in arms or legs:     Sudden onset of difficulty speaking or slurred speech:    Temporary loss of vision in one eye:     Problems with dizziness:         Gastrointestinal    Blood in stool:     Vomited blood:         Genitourinary    Burning when urinating:     Blood in urine:        Psychiatric    Major depression:         Hematologic    Bleeding problems:    Problems with blood clotting too easily:        Skin    Rashes or ulcers:        Constitutional    Fever or chills:      PHYSICAL EXAM: Vitals:   02/26/20 0846  BP: (!) 161/71  Pulse: 82  Resp: 20  Temp: (!) 97.2 F (36.2 C)  SpO2: 96%  Weight: (!) 472 lb 14.4 oz (214.5 kg)  Height: 6\' 1"  (1.854 m)    GENERAL: The patient is a well-nourished male, in no acute distress. The vital signs are documented above. CARDIOVASCULAR: 2+ dorsalis pedis pulses bilaterally.  He has a thickening and marked skin changes over the posterior tibial and I do not feel posterior tibial pulses bilaterally. PULMONARY: There is good air exchange  ABDOMEN: Soft and non-tender  MUSCULOSKELETAL: There are no major deformities or cyanosis. NEUROLOGIC: No focal weakness or paresthesias are detected. SKIN: Advanced severe venous stasis disease circumferentially with hemosiderin deposits thickening and superficial ulcerations on both legs from below his knees to his ankles. PSYCHIATRIC: The patient has a normal affect.  DATA:  Noninvasive  arterial studies show normal triphasic waveforms bilaterally.  He has calcified vessels therefore ankle arm index is unreliable.  Venous duplex shows no evidence of DVT  MEDICAL ISSUES: Had long discussion with the patient.  Explained that he does not have any arterial blockages that would make it difficult for him to heal the toe ulcerations.  I suspect that this was related to his neuropathy and a new pair of shoes.  I also had a long discussion regarding his venous stasis disease.  He is to see the wound care center on 03/13/20 to establish care of his chronic wounds.  I have prescribed Silvadene until that time.  We treated his wound today with Silvadene with a Curlex over this and Ace wrap over the Curlex.  I did explain the critical importance for elevation when possible and compression.  He obviously has had a difficult time with standard stockings due to his morbid obesity.  The Ace wraps should give him adequate compression until he sees the wound center.  He will see [place] again on an as-needed basis.  He will continue follow-up with Dr. Cannon Kettle regarding his toe ulcerations  Rosetta Posner, MD [facility] Tel [phone] Pager [phone]

## 2020-02-26 NOTE — Telephone Encounter (Signed)
Patient is scheduled to see physician(Dr Cannon Kettle) on next Tuesday but should he get a refill on his antibiotics before his upcoming appointment?

## 2020-02-26 NOTE — Telephone Encounter (Signed)
Clindamycin has been refilled

## 2020-02-27 ENCOUNTER — Telehealth: Payer: Self-pay | Admitting: Sports Medicine

## 2020-02-27 NOTE — Telephone Encounter (Signed)
It was sent to his pharmacy on yesterday -Dr. Cannon Kettle

## 2020-02-27 NOTE — Telephone Encounter (Signed)
Pt has called and wanted a refill of antibiotics please advise

## 2020-02-28 ENCOUNTER — Telehealth: Payer: Self-pay | Admitting: *Deleted

## 2020-02-28 NOTE — Telephone Encounter (Signed)
Pt called states the antibiotic has not been called to the [facility]. I reviewed the clinicals and orders, the Clindamycin had been ordered but the prompt was set to Print. I called orders to [facility] and left Clindamycin order.

## 2020-03-04 ENCOUNTER — Encounter: Payer: Self-pay | Admitting: Sports Medicine

## 2020-03-04 ENCOUNTER — Other Ambulatory Visit: Payer: Self-pay

## 2020-03-04 ENCOUNTER — Ambulatory Visit: Payer: PPO | Admitting: Sports Medicine

## 2020-03-04 VITALS — Temp 97.4°F

## 2020-03-04 DIAGNOSIS — E1142 Type 2 diabetes mellitus with diabetic polyneuropathy: Secondary | ICD-10-CM | POA: Diagnosis not present

## 2020-03-04 DIAGNOSIS — L97521 Non-pressure chronic ulcer of other part of left foot limited to breakdown of skin: Secondary | ICD-10-CM

## 2020-03-04 DIAGNOSIS — L97511 Non-pressure chronic ulcer of other part of right foot limited to breakdown of skin: Secondary | ICD-10-CM | POA: Diagnosis not present

## 2020-03-04 DIAGNOSIS — I872 Venous insufficiency (chronic) (peripheral): Secondary | ICD-10-CM

## 2020-03-05 ENCOUNTER — Encounter: Payer: Self-pay | Admitting: Sports Medicine

## 2020-03-05 NOTE — Progress Notes (Signed)
Subjective: Joseph [name]. is a 64 y.o. male patient seen in office for follow-up evaluation bilateral toe wounds. Reports that wife has been helping with changing the dressings using Betadine and dry dressing to the toes daily.  Patient reports that he went to see vascular and they recommended some treatment for his wounds on the legs and felt like he had the potential to heal his toe ulcers which were likely related to rubbing from his shoes.  Patient denies nausea vomiting fever or chills at this time.  Patient is currently taking antibiotics without any issues.  Patient Active Problem List   Diagnosis Date Noted  . Monitoring for anticoagulant use 02/19/2020  . Accumulation of fluid in tissues 02/19/2020  . Anxiety state 02/19/2020  . Diabetes mellitus type 2, uncontrolled ([place]) 02/19/2020  . Diabetes mellitus with polyneuropathy ([place]) 02/19/2020  . Fibrositis 02/19/2020  . Peripheral blood vessel disorder ([place]) 02/19/2020  . Varicose veins of lower extremities with inflammation 02/19/2020  . Acute renal insufficiency 01/03/2015  . Esophageal reflux 01/02/2015  . Polyneuropathy in diabetes(357.2) 01/02/2015  . Abscess of postoperative wound of abdominal wall 01/02/2015  . Cellulitis and abscess of trunk 12/31/2014  . S/P IVC filter-temporary REMOVED   . Status post panniculectomy Oct 2015 07/17/2014  . Morbid obesity BMI 65 03/22/2014  . Pure hypercholesterolemia 03/22/2014  . Essential hypertension, benign 03/22/2014  . Diabetes ([place]) 03/22/2014  . Panniculitis 03/22/2014  . OSA (obstructive sleep apnea) 03/22/2014  . Hx pulmonary embolism 03/22/2014  . Diabetic neuropathy ([place]) 02/20/2014   Current Outpatient Medications on File Prior to Visit  Medication Sig Dispense Refill  . acetaminophen (TYLENOL) 500 MG tablet Take 1,000 mg by mouth daily as needed for moderate pain or headache.     Marland Kitchen amLODipine (NORVASC) 10 MG tablet Take 10 mg by mouth every evening.     Marland Kitchen buPROPion  (WELLBUTRIN XL) 150 MG 24 hr tablet Take 150 mg by mouth daily.  1  . clindamycin (CLEOCIN) 300 MG capsule Take 1 capsule (300 mg total) by mouth 3 (three) times daily. 21 capsule 0  . FLUoxetine (PROZAC) 40 MG capsule Take 40 mg by mouth every evening.     Marland Kitchen FREESTYLE LITE test strip TEST BLOOD SUGAR FOUR TIMES DAILY    . furosemide (LASIX) 20 MG tablet Take 20 mg by mouth daily.     . indapamide (LOZOL) 2.5 MG tablet Take 5 mg by mouth daily.     . insulin aspart (NOVOLOG) 100 UNIT/ML injection SMARTSIG:5-20 Unit(s) SUB-Q As Directed    . insulin lispro (HUMALOG) 100 UNIT/ML injection Inject 5-30 Units into the skin 3 (three) times daily with meals.    . insulin NPH Human (NOVOLIN N) 100 UNIT/ML injection Inject 30 Units into the skin 2 (two) times daily.    . metFORMIN (GLUCOPHAGE) 1000 MG tablet Take 1,000 mg by mouth 2 (two) times daily with a meal.    . omeprazole (PRILOSEC) 40 MG capsule Take 40 mg by mouth daily as needed (acid reflux).     . pioglitazone (ACTOS) 45 MG tablet Take 45 mg by mouth daily.    . pravastatin (PRAVACHOL) 40 MG tablet Take 40 mg by mouth every evening.     . Prenatal Vit-Fe Fumarate-FA (PRENATAL FA PO) Take 1 tablet by mouth daily.    . quinapril (ACCUPRIL) 20 MG tablet Take 20 mg by mouth daily.     . silver sulfADIAZINE (SILVADENE) 1 % cream Apply 1 application topically  daily. 50 g 0  . warfarin (COUMADIN) 5 MG tablet Take 5-7.5 mg by mouth at bedtime. Take 5 mg in the evening on Sun, Mon, Wed, and Fri. Take 7.5 mg in the evening on Tue, Thur, and Sat    . Wheat Dextrin (BENEFIBER) POWD Take 15 mLs by mouth daily as needed (constipation). Mix in coffee and drink     No current facility-administered medications on file prior to visit.   Allergies  Allergen Reactions  . Tape     Peels skin on legs    Recent Results (from the past 2160 hour(s))  Lactic acid, plasma     Status: Abnormal   Collection Time: 02/15/20  5:32 PM  Result Value Ref Range    Lactic Acid, Venous 2.4 (HH) 0.5 - 1.9 mmol/L    Comment: CRITICAL RESULT CALLED TO, READ BACK BY AND VERIFIED WITH: T PHILLIPS,RN 1813 02/15/2020 WBOND Performed at [facility], [place] [address], [place], [place] [address]   Comprehensive metabolic panel     Status: Abnormal   Collection Time: 02/15/20  5:32 PM  Result Value Ref Range   Sodium 137 135 - 145 mmol/L   Potassium 4.1 3.5 - 5.1 mmol/L   Chloride 102 98 - 111 mmol/L   CO2 23 22 - 32 mmol/L   Glucose, Bld 97 70 - 99 mg/dL    Comment: Glucose reference range applies only to samples taken after fasting for at least 8 hours.   BUN 30 (H) 8 - 23 mg/dL   Creatinine, Ser 1.62 (H) 0.61 - 1.24 mg/dL   Calcium 8.4 (L) 8.9 - 10.3 mg/dL   Total Protein 6.6 6.5 - 8.1 g/dL   Albumin 3.0 (L) 3.5 - 5.0 g/dL   AST 17 15 - 41 U/L   ALT 13 0 - 44 U/L   Alkaline Phosphatase 80 38 - 126 U/L   Total Bilirubin 0.4 0.3 - 1.2 mg/dL   GFR calc non Af Amer 45 (L) >60 mL/min   GFR calc Af Amer 52 (L) >60 mL/min   Anion gap 12 5 - 15    Comment: Performed at [facility] [address], [place], [place] [address]  CBC with Differential     Status: Abnormal   Collection Time: 02/15/20  5:32 PM  Result Value Ref Range   WBC 7.7 4.0 - 10.5 K/uL   RBC 4.86 4.22 - 5.81 MIL/uL   Hemoglobin 13.7 13.0 - 17.0 g/dL   HCT 44.3 39.0 - 52.0 %   MCV 91.2 80.0 - 100.0 fL   MCH 28.2 26.0 - 34.0 pg   MCHC 30.9 30.0 - 36.0 g/dL   RDW 15.8 (H) 11.5 - 15.5 %   Platelets 273 150 - 400 K/uL   nRBC 0.0 0.0 - 0.2 %   Neutrophils Relative % 62 %   Neutro Abs 4.8 1.7 - 7.7 K/uL   Lymphocytes Relative 26 %   Lymphs Abs 2.0 0.7 - 4.0 K/uL   Monocytes Relative 7 %   Monocytes Absolute 0.6 0.1 - 1.0 K/uL   Eosinophils Relative 4 %   Eosinophils Absolute 0.3 0.0 - 0.5 K/uL   Basophils Relative 1 %   Basophils Absolute 0.1 0.0 - 0.1 K/uL   Immature Granulocytes 0 %   Abs Immature Granulocytes 0.02 0.00 - 0.07 K/uL    Comment: Performed at [facility] [address] [place], [place] [address]  Lactic acid, plasma  Status: None   Collection Time: 02/15/20 10:06 PM  Result Value Ref Range   Lactic Acid, Venous 1.4 0.5 - 1.9 mmol/L    Comment: Performed at [facility], [place] [address], [place], [place] [address]  Protime-INR     Status: Abnormal   Collection Time: 02/15/20 10:06 PM  Result Value Ref Range   Prothrombin Time 27.3 (H) 11.4 - 15.2 seconds   INR 2.6 (H) 0.8 - 1.2    Comment: (NOTE) INR goal varies based on device and disease states. Performed at [facility], [place] [address], [place], [place] [address]     Objective: There were no vitals filed for this visit.  General: Patient is awake, alert, oriented x 3 and in no acute distress.  Dermatology: Skin is warm and dry bilateral with a partial thickness ulceration present distal tuft of right great toe fibrogranular base. Ulceration measures 1.5 cm x 1.5 cm x 0.1cm.  With marginal keratosis that appears to be improving and no nail from previous removal/trim because the wound initially involve the nail and nailbed.  There is decreased erythema and edema.  No warmth or malodor.  There is a partial-thickness ulcer noted that measures less than 1 cm to the distal tuft of the left great toe at area of previous blistered callus with a granular base, no malodor, bloody active drainage, minimal edema and erythema.  The ulcerations do not probe to bone.   There are chronic venous skin changes bilateral legs  Vascular: Dorsalis Pedis pulse = 0/4 Bilateral,  Posterior Tibial pulse = 0/4 Bilateral,  Capillary Fill Time < 5 seconds, due to chronic skin changes/lymphedema   Neurologic: Protective sensation absent bilateral.   Musculosketal: No Pain with palpation to ulcerated areas bilateral. No pain with compression to calves bilateral.   No results for input(s): GRAMSTAIN, LABORGA in the last 8760 hours.  Assessment and Plan:  Problem List Items Addressed  This Visit      Endocrine   Diabetic neuropathy ([place])    Other Visit Diagnoses    Toe ulcer, right, limited to breakdown of skin ([place])    -  Primary   Ulcer of toe of left foot, limited to breakdown of skin ([place])       Chronic venous stasis dermatitis of both lower extremities         -Examined patient and discussed the progression of the wound and treatment alternatives. -Xrays reviewed from ER - Excisionally dedbrided loose skin to healthy bleeding borders removing nonviable tissue using a sterile tissue nipper very gently at right and left great toe. Wound measures post debridement as above. Wound was debrided to the level of the dermis with viable wound base exposed to promote healing. Hemostasis was achieved with manuel pressure. Patient tolerated procedure well without any discomfort or anesthesia necessary for this wound debridement.  -Applied betadine and dry sterile dressing and instructed patient to continue with daily dressings to both big toes until he can be seen by the wound care center on June 17 -Continue with antibiotic cream and compressive dressings to the legs until he can be seen by wound center on June 17 -Continue with antibiotics -Vascular recommendations review per Dr. Donnetta Hutching patient has significant venous impairment but arterial flow is good and he feels he has the potential to be able to heal his toe ulcerations - Advised patient to go to the ER or return to office if the wound worsens or if constitutional symptoms are present. -  Patient to return to office if need be after he has seen the wound care center  Landis Martins, DPM

## 2020-03-12 DIAGNOSIS — F411 Generalized anxiety disorder: Secondary | ICD-10-CM | POA: Diagnosis not present

## 2020-03-12 DIAGNOSIS — I739 Peripheral vascular disease, unspecified: Secondary | ICD-10-CM | POA: Diagnosis not present

## 2020-03-12 DIAGNOSIS — Z86711 Personal history of pulmonary embolism: Secondary | ICD-10-CM | POA: Diagnosis not present

## 2020-03-12 DIAGNOSIS — Z7901 Long term (current) use of anticoagulants: Secondary | ICD-10-CM | POA: Diagnosis not present

## 2020-03-12 DIAGNOSIS — E1142 Type 2 diabetes mellitus with diabetic polyneuropathy: Secondary | ICD-10-CM | POA: Diagnosis not present

## 2020-03-12 DIAGNOSIS — I10 Essential (primary) hypertension: Secondary | ICD-10-CM | POA: Diagnosis not present

## 2020-03-12 DIAGNOSIS — E7800 Pure hypercholesterolemia, unspecified: Secondary | ICD-10-CM | POA: Diagnosis not present

## 2020-03-12 DIAGNOSIS — L97519 Non-pressure chronic ulcer of other part of right foot with unspecified severity: Secondary | ICD-10-CM | POA: Diagnosis not present

## 2020-03-12 DIAGNOSIS — E11621 Type 2 diabetes mellitus with foot ulcer: Secondary | ICD-10-CM | POA: Diagnosis not present

## 2020-03-12 DIAGNOSIS — K219 Gastro-esophageal reflux disease without esophagitis: Secondary | ICD-10-CM | POA: Diagnosis not present

## 2020-03-12 DIAGNOSIS — Z0000 Encounter for general adult medical examination without abnormal findings: Secondary | ICD-10-CM | POA: Diagnosis not present

## 2020-03-12 DIAGNOSIS — R609 Edema, unspecified: Secondary | ICD-10-CM | POA: Diagnosis not present

## 2020-03-13 ENCOUNTER — Other Ambulatory Visit: Payer: Self-pay

## 2020-03-13 ENCOUNTER — Encounter (HOSPITAL_BASED_OUTPATIENT_CLINIC_OR_DEPARTMENT_OTHER): Payer: PPO | Attending: Internal Medicine | Admitting: Internal Medicine

## 2020-03-13 DIAGNOSIS — I10 Essential (primary) hypertension: Secondary | ICD-10-CM | POA: Insufficient documentation

## 2020-03-13 DIAGNOSIS — L97518 Non-pressure chronic ulcer of other part of right foot with other specified severity: Secondary | ICD-10-CM | POA: Diagnosis not present

## 2020-03-13 DIAGNOSIS — Z833 Family history of diabetes mellitus: Secondary | ICD-10-CM | POA: Insufficient documentation

## 2020-03-13 DIAGNOSIS — E1151 Type 2 diabetes mellitus with diabetic peripheral angiopathy without gangrene: Secondary | ICD-10-CM | POA: Insufficient documentation

## 2020-03-13 DIAGNOSIS — Z8249 Family history of ischemic heart disease and other diseases of the circulatory system: Secondary | ICD-10-CM | POA: Diagnosis not present

## 2020-03-13 DIAGNOSIS — L97512 Non-pressure chronic ulcer of other part of right foot with fat layer exposed: Secondary | ICD-10-CM | POA: Diagnosis not present

## 2020-03-13 DIAGNOSIS — Z823 Family history of stroke: Secondary | ICD-10-CM | POA: Diagnosis not present

## 2020-03-13 DIAGNOSIS — G4733 Obstructive sleep apnea (adult) (pediatric): Secondary | ICD-10-CM | POA: Insufficient documentation

## 2020-03-13 DIAGNOSIS — I87303 Chronic venous hypertension (idiopathic) without complications of bilateral lower extremity: Secondary | ICD-10-CM | POA: Diagnosis not present

## 2020-03-13 DIAGNOSIS — L97523 Non-pressure chronic ulcer of other part of left foot with necrosis of muscle: Secondary | ICD-10-CM | POA: Diagnosis not present

## 2020-03-13 DIAGNOSIS — L97522 Non-pressure chronic ulcer of other part of left foot with fat layer exposed: Secondary | ICD-10-CM | POA: Diagnosis not present

## 2020-03-13 DIAGNOSIS — I872 Venous insufficiency (chronic) (peripheral): Secondary | ICD-10-CM | POA: Insufficient documentation

## 2020-03-13 DIAGNOSIS — Z836 Family history of other diseases of the respiratory system: Secondary | ICD-10-CM | POA: Diagnosis not present

## 2020-03-13 DIAGNOSIS — Z86718 Personal history of other venous thrombosis and embolism: Secondary | ICD-10-CM | POA: Diagnosis not present

## 2020-03-13 DIAGNOSIS — Z7901 Long term (current) use of anticoagulants: Secondary | ICD-10-CM | POA: Insufficient documentation

## 2020-03-13 DIAGNOSIS — Z86711 Personal history of pulmonary embolism: Secondary | ICD-10-CM | POA: Insufficient documentation

## 2020-03-13 DIAGNOSIS — E1142 Type 2 diabetes mellitus with diabetic polyneuropathy: Secondary | ICD-10-CM | POA: Diagnosis not present

## 2020-03-13 DIAGNOSIS — E11621 Type 2 diabetes mellitus with foot ulcer: Secondary | ICD-10-CM | POA: Diagnosis not present

## 2020-03-13 NOTE — Progress Notes (Signed)
PATE, AYLWARD ([id number]) Visit Report for 03/13/2020 Abuse/Suicide Risk Screen Details Patient Name: Date of Service: Joseph [name], [name] 03/13/2020 2:45 PM Medical Record Number: [id number] Patient Account Number: [id number] Date of Birth/Sex: Treating RN: Sep 28, 1955 (64 y.o. Male) Kela Millin Primary Care Vaida Kerchner: Lynne Logan Other Clinician: Referring Cristela Stalder: Treating Reeve Mallo/Extender: Alfonse Flavors, Titorya Weeks in Treatment: 0 Abuse/Suicide Risk Screen Items Answer ABUSE RISK SCREEN: Has anyone close to you tried to hurt or harm you recentlyo No Do you feel uncomfortable with anyone in your familyo No Has anyone forced you do things that you didnt want to doo No Electronic Signature(s) Signed: 03/13/2020 4:29:25 PM By: Kela Millin Entered By: Kela Millin on 03/13/2020 15:48:58 -------------------------------------------------------------------------------- Activities of Daily Living Details Patient Name: Date of Service: Joseph [name], [place] UL C. 03/13/2020 2:45 PM Medical Record Number: [id number] Patient Account Number: [id number] Date of Birth/Sex: Treating RN: [birth year] (64 y.o. Male) Kela Millin Primary Care Nocholas Damaso: Lynne Logan Other Clinician: Referring Kelechi Astarita: Treating Zanayah Shadowens/Extender: Alfonse Flavors, Titorya Weeks in Treatment: 0 Activities of Daily Living Items Answer Activities of Daily Living (Please select one for each item) Drive Automobile Not Able T Medications ake Completely Able Use T elephone Completely Able Care for Appearance Need Assistance Use T oilet Need Assistance Bath / Shower Completely Able Dress Self Need Assistance Feed Self Completely Able Walk Need Assistance Get In / [place] Need Assistance Shop for Self Need Assistance Electronic Signature(s) Signed: 03/13/2020 4:29:25 PM By: Kela Millin Entered  By: Kela Millin on 03/13/2020 15:49:35 -------------------------------------------------------------------------------- Education Screening Details Patient Name: Date of Service: Joseph Fitz, [place] UL C. 03/13/2020 2:45 PM Medical Record Number: [id number] Patient Account Number: [id number] Date of Birth/Sex: Treating RN: [birth year] (64 y.o. Male) Kela Millin Primary Care Saavi Mceachron: Lynne Logan Other Clinician: Referring Del Wiseman: Treating Grady Lucci/Extender: Nettie Elm in Treatment: 0 Primary Learner Assessed: Patient Learning Preferences/Education Level/Primary Language Learning Preference: Explanation Highest Education Level: College or Above Preferred Language: English Cognitive Barrier Language Barrier: No Translator Needed: No Memory Deficit: No Emotional Barrier: No Cultural/Religious Beliefs Affecting Medical Care: No Physical Barrier Impaired Vision: No Impaired Hearing: No Decreased Hand dexterity: No Knowledge/Comprehension Knowledge Level: High Comprehension Level: High Ability to understand written instructions: High Ability to understand verbal instructions: High Motivation Anxiety Level: Calm Cooperation: Cooperative Education Importance: Acknowledges Need Interest in Health Problems: Asks Questions Perception: Coherent Willingness to Engage in Self-Management High Activities: Readiness to Engage in Self-Management High Activities: Electronic Signature(s) Signed: 03/13/2020 4:29:25 PM By: Kela Millin Entered By: Kela Millin on 03/13/2020 15:49:58 -------------------------------------------------------------------------------- Fall Risk Assessment Details Patient Name: Date of Service: Joseph Fitz, [place] UL C. 03/13/2020 2:45 PM Medical Record Number: [id number] Patient Account Number: [id number] Date of Birth/Sex: Treating RN: [birth year] (64 y.o. Male) Kela Millin Primary Care Esma Kilts: Lynne Logan Other  Clinician: Referring Theone Bowell: Treating Jaccob Czaplicki/Extender: Alfonse Flavors, Titorya Weeks in Treatment: 0 Fall Risk Assessment Items Have you had 2 or more falls in the last 12 monthso 0 Yes Have you had any fall that resulted in injury in the last 12 monthso 0 No FALLS RISK SCREEN History of falling - immediate or within 3 months 25 Yes Secondary diagnosis (Do you have 2 or more medical diagnoseso) 15 Yes Ambulatory aid None/bed rest/wheelchair/nurse 0 No Crutches/cane/walker 15 Yes Furniture 0 No Intravenous therapy Access/Saline/Heparin Lock 0 No Gait/Transferring Normal/ bed rest/ wheelchair 0 No Weak (short steps with  or without shuffle, stooped but able to lift head while walking, may seek 10 Yes support from furniture) Impaired (short steps with shuffle, may have difficulty arising from chair, head down, impaired 0 No balance) Mental Status Oriented to own ability 0 Yes Electronic Signature(s) Signed: 03/13/2020 4:29:25 PM By: Kela Millin Entered By: Kela Millin on 03/13/2020 15:50:34 -------------------------------------------------------------------------------- Foot Assessment Details Patient Name: Date of Service: Joseph Fitz, [place] UL C. 03/13/2020 2:45 PM Medical Record Number: [id number] Patient Account Number: [id number] Date of Birth/Sex: Treating RN: April 26, 1956 (64 y.o. Male) Kela Millin Primary Care Cache Bills: Lynne Logan Other Clinician: Referring Catelynn Sparger: Treating Cyndel Griffey/Extender: Alfonse Flavors, Titorya Weeks in Treatment: 0 Foot Assessment Items Site Locations + = Sensation present, - = Sensation absent, C = Callus, U = Ulcer R = Redness, W = Warmth, M = Maceration, PU = Pre-ulcerative lesion F = Fissure, S = Swelling, D = Dryness Assessment Right: Left: Other Deformity: No No Prior Foot Ulcer: No No Prior Amputation: No No Charcot Joint: No No Ambulatory Status: Ambulatory With Help Assistance Device: Walker Gait:  Steady Electronic Signature(s) Signed: 03/13/2020 4:29:25 PM By: Kela Millin Entered By: Kela Millin on 03/13/2020 15:55:11 -------------------------------------------------------------------------------- Nutrition Risk Screening Details Patient Name: Date of Service: Joseph [name], [place] UL C. 03/13/2020 2:45 PM Medical Record Number: [id number] Patient Account Number: [id number] Date of Birth/Sex: Treating RN: 05-19-1956 (63 y.o. Male) Kela Millin Primary Care Chyanna Flock: Lynne Logan Other Clinician: Referring Ariyanah Aguado: Treating Tanecia Mccay/Extender: Alfonse Flavors, Titorya Weeks in Treatment: 0 Height (in): 73 Weight (lbs): 470 Body Mass Index (BMI): 62 Nutrition Risk Screening Items Score Screening NUTRITION RISK SCREEN: I have an illness or condition that made me change the kind and/or amount of food I eat 2 Yes I eat fewer than two meals per day 0 No I eat few fruits and vegetables, or milk products 0 No I have three or more drinks of beer, liquor or wine almost every day 0 No I have tooth or mouth problems that make it hard for me to eat 0 No I don't always have enough money to buy the food I need 0 No I eat alone most of the time 0 No I take three or more different prescribed or over-the-counter drugs a day 1 Yes Without wanting to, I have lost or gained 10 pounds in the last six months 2 Yes I am not always physically able to shop, cook and/or feed myself 0 No Nutrition Protocols Good Risk Protocol 0 No interventions needed Moderate Risk Protocol High Risk Proctocol Risk Level: Moderate Risk Score: 5 Electronic Signature(s) Signed: 03/13/2020 4:29:25 PM By: Kela Millin Entered By: Kela Millin on 03/13/2020 15:50:55

## 2020-03-14 DIAGNOSIS — E11621 Type 2 diabetes mellitus with foot ulcer: Secondary | ICD-10-CM | POA: Diagnosis not present

## 2020-03-14 NOTE — Progress Notes (Signed)
EWARD, RUTIGLIANO ([id number]) Visit Report for 03/13/2020 Allergy List Details Patient Name: Date of Service: Joseph [name], [name] 03/13/2020 2:45 PM Medical Record Number: [id number] Patient Account Number: [id number] Date of Birth/Sex: Treating RN: 1955-11-01 (64 y.o. Marvis Repress Primary Care Jakia Kennebrew: Lynne Logan Other Clinician: Referring Kameria Canizares: Treating Capricia Serda/Extender: Lawerance Cruel Weeks in Treatment: 0 Allergies Active Allergies adhesive Allergy Notes Electronic Signature(s) Signed: 03/13/2020 4:29:25 PM By: Kela Millin Entered By: Kela Millin on 03/13/2020 15:40:53 -------------------------------------------------------------------------------- Arrival Information Details Patient Name: Date of Service: Joseph Fitz, PA UL C. 03/13/2020 2:45 PM Medical Record Number: [id number] Patient Account Number: [id number] Date of Birth/Sex: Treating RN: Jan 31, 1956 (64 y.o. Marvis Repress Primary Care Lariyah Shetterly: Lynne Logan Other Clinician: Referring Herson Prichard: Treating Montasia Chisenhall/Extender: Lorenza Evangelist in Treatment: 0 Visit Information Patient Arrived: Charlyn Minerva Time: 15:36 Accompanied By: wife Transfer Assistance: None Patient Identification Verified: Yes Secondary Verification Process Completed: Yes Patient Has Alerts: Yes Patient Alerts: Patient on Blood Thinner History Since Last Visit Electronic Signature(s) Signed: 03/13/2020 4:29:25 PM By: Kela Millin Entered By: Kela Millin on 03/13/2020 15:38:59 -------------------------------------------------------------------------------- Clinic Level of Care Assessment Details Patient Name: Date of Service: Joseph [name], [place] UL C. 03/13/2020 2:45 PM Medical Record Number: [id number] Patient Account Number: [id number] Date of Birth/Sex: Treating RN: 1956/07/20 (64 y.o. [name] Primary Care Tashanna Dolin: Lynne Logan Other Clinician: Referring  Isidro Monks: Treating Maciah Feeback/Extender: Lawerance Cruel Weeks in Treatment: 0 Clinic Level of Care Assessment Items TOOL 1 Quantity Score X- 1 0 Use when EandM and Procedure is performed on INITIAL visit ASSESSMENTS - Nursing Assessment / Reassessment X- 1 20 General Physical Exam (combine w/ comprehensive assessment (listed just below) when performed on new pt. evals) X- 1 25 Comprehensive Assessment (HX, ROS, Risk Assessments, Wounds Hx, etc.) ASSESSMENTS - Wound and Skin Assessment / Reassessment X- 1 10 Dermatologic / Skin Assessment (not related to wound area) ASSESSMENTS - Ostomy and/or Continence Assessment and Care []  - 0 Incontinence Assessment and Management []  - 0 Ostomy Care Assessment and Management (repouching, etc.) PROCESS - Coordination of Care []  - 0 Simple Patient / Family Education for ongoing care X- 1 20 Complex (extensive) Patient / Family Education for ongoing care X- 1 10 Staff obtains Programmer, systems, Records, T Results / Process Orders est []  - 0 Staff telephones HHA, Nursing Homes / Clarify orders / etc []  - 0 Routine Transfer to another Facility (non-emergent condition) []  - 0 Routine Hospital Admission (non-emergent condition) X- 1 15 New Admissions / Biomedical engineer / Ordering NPWT Apligraf, etc. , []  - 0 Emergency Hospital Admission (emergent condition) PROCESS - Special Needs []  - 0 Pediatric / Minor Patient Management []  - 0 Isolation Patient Management []  - 0 Hearing / Language / Visual special needs []  - 0 Assessment of Community assistance (transportation, D/C planning, etc.) []  - 0 Additional assistance / Altered mentation []  - 0 Support Surface(s) Assessment (bed, cushion, seat, etc.) INTERVENTIONS - Miscellaneous []  - 0 External ear exam []  - 0 Patient Transfer (multiple staff / Civil Service fast streamer / Similar devices) []  - 0 Simple Staple / Suture removal (25 or less) []  - 0 Complex Staple / Suture removal (26 or  more) []  - 0 Hypo/Hyperglycemic Management (do not check if billed separately) []  - 0 Ankle / Brachial Index (ABI) - do not check if billed separately Has the patient been seen at the hospital within the last three years: Yes Total Score:  100 Level Of Care: New/Established - Level 3 Electronic Signature(s) Signed: 03/13/2020 5:45:21 PM By: Deon Pilling Entered By: Deon Pilling on 03/13/2020 16:33:16 -------------------------------------------------------------------------------- Encounter Discharge Information Details Patient Name: Date of Service: Joseph Fitz, PA UL C. 03/13/2020 2:45 PM Medical Record Number: [id number] Patient Account Number: [id number] Date of Birth/Sex: Treating RN: 10-29-1955 (64 y.o. Ernestene Mention Primary Care Berle [name]: Lynne Logan Other Clinician: Referring Uriah Trueba: Treating Eliakim Tendler/Extender: Lawerance Cruel Weeks in Treatment: 0 Encounter Discharge Information Items Post Procedure Vitals Discharge Condition: Stable Temperature (F): 97.5 Ambulatory Status: Walker Pulse (bpm): 82 Discharge Destination: Home Respiratory Rate (breaths/min): 18 Transportation: Private Auto Blood Pressure (mmHg): 193/47 Accompanied By: spouse Schedule Follow-up Appointment: Yes Clinical Summary of Care: Patient Declined Electronic Signature(s) Signed: 03/13/2020 5:22:52 PM By: Baruch Gouty RN, BSN Entered By: Baruch Gouty on 03/13/2020 16:55:08 -------------------------------------------------------------------------------- Lower Extremity Assessment Details Patient Name: Date of Service: Joseph Fitz, PA UL C. 03/13/2020 2:45 PM Medical Record Number: [id number] Patient Account Number: [id number] Date of Birth/Sex: Treating RN: February 27, 1956 (64 y.o. Marvis Repress Primary Care Aybree Lanyon: Lynne Logan Other Clinician: Referring Cantrell Martus: Treating Malaiyah Achorn/Extender: Lawerance Cruel Weeks in Treatment: 0 Edema  Assessment Assessed: [name]: No] [Right: No] E[Left: dema] [Right: :] Calf Left: Right: Point of Measurement: 41 cm From Medial Instep 46.3 cm 43 cm Ankle Left: Right: Point of Measurement: 9 cm From Medial Instep 24 cm 25 cm Vascular Assessment Pulses: Dorsalis Pedis Palpable: [Left:Yes] [Right:Yes] Electronic Signature(s) Signed: 03/13/2020 4:29:25 PM By: Kela Millin Entered By: Kela Millin on 03/13/2020 15:56:56 -------------------------------------------------------------------------------- Multi Wound Chart Details Patient Name: Date of Service: Joseph Fitz, PA UL C. 03/13/2020 2:45 PM Medical Record Number: [id number] Patient Account Number: [id number] Date of Birth/Sex: Treating RN: 1956/01/19 (64 y.o. Lorette Ang, Tammi Klippel Primary Care Daymon Hora: Lynne Logan Other Clinician: Referring Constantina Laseter: Treating Kazandra Forstrom/Extender: Lawerance Cruel Weeks in Treatment: 0 Vital Signs Height(in): 73 Capillary Blood Glucose(mg/dl): 106 Weight(lbs): 470 Pulse(bpm): 5 Body Mass Index(BMI): 56 Blood Pressure(mmHg): 193/47 Temperature(F): 97.5 Respiratory Rate(breaths/min): 19 Photos: [4:No Photos Right T Great oe] [5:No Photos Left T Great oe] [N/A:N/A N/A] Wound Location: [4:Gradually Appeared] [5:Gradually Appeared] [N/A:N/A] Wounding Event: [4:Diabetic Wound/Ulcer of the Lower] [5:Diabetic Wound/Ulcer of the Lower] [N/A:N/A] Primary Etiology: [4:Extremity Cataracts, Sleep Apnea, Arrhythmia,] [5:Extremity Cataracts, Sleep Apnea, Arrhythmia,] [N/A:N/A] Comorbid History: [4:Hypertension, Peripheral Venous Disease, Type II Diabetes, Neuropathy 02/15/2020] [5:Hypertension, Peripheral Venous Disease, Type II Diabetes, Neuropathy 02/19/2020] [N/A:N/A] Date Acquired: [4:0] [5:0] [N/A:N/A] Weeks of Treatment: [4:Open] [5:Open] [N/A:N/A] Wound Status: [4:2.2x2.5x0.1] [5:1x0.8x0.1] [N/A:N/A] Measurements L x W x D (cm) [4:4.32] [5:0.628] [N/A:N/A] A (cm) : rea [4:0.432]  [5:0.063] [N/A:N/A] Volume (cm) : [4:Grade 2] [5:Grade 2] [N/A:N/A] Classification: [4:Medium] [5:Medium] [N/A:N/A] Exudate A mount: [4:Serosanguineous] [5:Serosanguineous] [N/A:N/A] Exudate Type: [4:red, brown] [5:red, brown] [N/A:N/A] Exudate Color: [4:Distinct, outline attached] [5:Distinct, outline attached] [N/A:N/A] Wound Margin: [4:Small (1-33%)] [5:Small (1-33%)] [N/A:N/A] Granulation A mount: [4:Pink] [5:Pink] [N/A:N/A] Granulation Quality: [4:Large (67-100%)] [5:Large (67-100%)] [N/A:N/A] Necrotic A mount: [4:Eschar, Adherent Slough] [5:Adherent Slough] [N/A:N/A] Necrotic Tissue: [4:Fat Layer (Subcutaneous Tissue)] [5:Fat Layer (Subcutaneous Tissue)] [N/A:N/A] Exposed Structures: [4:Exposed: Yes Fascia: No Tendon: No Muscle: No Joint: No Bone: No None] [5:Exposed: Yes Fascia: No Tendon: No Muscle: No Joint: No Bone: No None] [N/A:N/A] Epithelialization: [4:Debridement - Excisional] [5:Chemical/Enzymatic/Mechanical -] [N/A:N/A] Debridement: [4:16:20] [5:Excisional 16:20] [N/A:N/A] Pre-procedure Verification/Time Out Taken: [4:Lidocaine 4% Topical Solution] [5:Lidocaine 4% Topical Solution] [N/A:N/A] Pain Control: [4:Subcutaneous, Slough] [5:Subcutaneous, Slough] [N/A:N/A] Tissue Debrided: [4:Skin/Subcutaneous Tissue] [5:N/A] [N/A:N/A] Level: [4:5.5] [5:N/A] [N/A:N/A]  Debridement A (sq cm): [4:rea Curette] [5:Curette] [N/A:N/A] Instrument: [4:Minimum] [5:Minimum] [N/A:N/A] Bleeding: [4:Pressure] [5:Pressure] [N/A:N/A] Hemostasis A chieved: [4:0] [5:0] [N/A:N/A] Procedural Pain: [4:0] [5:0] [N/A:N/A] Post Procedural Pain: [4:Procedure was tolerated well] [5:Procedure was tolerated well] [N/A:N/A] Debridement Treatment Response: [4:2.2x2.5x0.1] [5:1x0.8x0.1] [N/A:N/A] Post Debridement Measurements L x W x D (cm) [4:0.432] [5:0.063] [N/A:N/A] Post Debridement Volume: (cm) [4:Debridement] [5:Debridement] [N/A:N/A] Treatment Notes Wound #4 (Right Toe Great) 3. Primary Dressing  Applied Hydrofera Blue 4. Secondary Dressing Dry Gauze Roll Gauze 5. Secured With Tape 7. Footwear/Offloading device applied Felt/Foam Surgical shoe Wound #5 (Left Toe Great) 3. Primary Dressing Applied Hydrofera Blue 4. Secondary Dressing Dry Gauze Roll Gauze 5. Secured With Tape 7. Footwear/Offloading device applied Felt/Foam Surgical shoe Electronic Signature(s) Signed: 03/13/2020 5:45:21 PM By: Deon Pilling Signed: 03/14/2020 7:45:08 AM By: Linton Ham MD Entered By: Linton Ham on 03/13/2020 17:36:16 -------------------------------------------------------------------------------- Multi-Disciplinary Care Plan Details Patient Name: Date of Service: Joseph Fitz, PA UL C. 03/13/2020 2:45 PM Medical Record Number: [id number] Patient Account Number: [id number] Date of Birth/Sex: Treating RN: 1955/11/21 (64 y.o. Lorette Ang, Tammi Klippel Primary Care Susa Bones: Lynne Logan Other Clinician: Referring Dameon Soltis: Treating Lelynd Poer/Extender: Lorenza Evangelist in Treatment: 0 Active Inactive Abuse / Safety / Falls / Self Care Management Nursing Diagnoses: Potential for falls Goals: Patient will remain injury free related to falls Date Initiated: 03/13/2020 Target Resolution Date: 05/02/2020 Goal Status: Active Interventions: Provide education on fall prevention Notes: Nutrition Nursing Diagnoses: Potential for alteratiion in Nutrition/Potential for imbalanced nutrition Goals: Patient/caregiver agrees to and verbalizes understanding of need to obtain nutritional consultation Date Initiated: 03/13/2020 Target Resolution Date: 04/04/2020 Goal Status: Active Interventions: Provide education on nutrition Treatment Activities: Patient referred to Primary Care Physician for further nutritional evaluation : 03/13/2020 Notes: Orientation to the Wound Care Program Nursing Diagnoses: Knowledge deficit related to the wound healing center  program Goals: Patient/caregiver will verbalize understanding of the [facility] Date Initiated: 03/13/2020 Target Resolution Date: 04/04/2020 Goal Status: Active Interventions: Provide education on orientation to the wound center Notes: Wound/Skin Impairment Nursing Diagnoses: Knowledge deficit related to ulceration/compromised skin integrity Goals: Patient/caregiver will verbalize understanding of skin care regimen Date Initiated: 03/13/2020 Target Resolution Date: 04/03/2020 Goal Status: Active Interventions: Assess patient/caregiver ability to perform ulcer/skin care regimen upon admission and as needed Assess ulceration(s) every visit Provide education on ulcer and skin care Treatment Activities: Skin care regimen initiated : 03/13/2020 Topical wound management initiated : 03/13/2020 Notes: Electronic Signature(s) Signed: 03/13/2020 5:45:21 PM By: Deon Pilling Entered By: Deon Pilling on 03/13/2020 15:53:54 -------------------------------------------------------------------------------- Pain Assessment Details Patient Name: Date of Service: Joseph [name], [place] UL C. 03/13/2020 2:45 PM Medical Record Number: [id number] Patient Account Number: [id number] Date of Birth/Sex: Treating RN: Jan 25, 1956 (64 y.o. Marvis Repress Primary Care Rudean Icenhour: Lynne Logan Other Clinician: Referring Oswin Griffith: Treating Kriss Perleberg/Extender: Lawerance Cruel Weeks in Treatment: 0 Active Problems Location of Pain Severity and Description of Pain Patient Has Paino No Site Locations Pain Management and Medication Current Pain Management: Electronic Signature(s) Signed: 03/13/2020 4:29:25 PM By: Kela Millin Entered By: Kela Millin on 03/13/2020 16:01:08 -------------------------------------------------------------------------------- Patient/Caregiver Education Details Patient Name: Date of Service: Joseph Fitz, PA UL C. 6/17/2021andnbsp2:45 PM Medical Record  Number: [id number] Patient Account Number: [id number] Date of Birth/Gender: Treating RN: May 03, 1956 (64 y.o. [name] Primary Care Physician: Lynne Logan Other Clinician: Referring Physician: Treating Physician/Extender: Lorenza Evangelist in Treatment: 0 Education Assessment Education Provided To: Patient Education Topics Provided  Welcome T [facility]: Welcome T [facility] o Methods: Explain/Verbal, Printed Responses: Reinforcements needed Wound Debridement: Handouts: Wound Debridement Methods: Explain/Verbal Responses: Reinforcements needed Electronic Signature(s) Signed: 03/13/2020 5:45:21 PM By: Deon Pilling Entered By: Deon Pilling on 03/13/2020 15:47:51 -------------------------------------------------------------------------------- Wound Assessment Details Patient Name: Date of Service: Joseph [name], [place] UL C. 03/13/2020 2:45 PM Medical Record Number: [id number] Patient Account Number: [id number] Date of Birth/Sex: Treating RN: 21-Sep-1956 (64 y.o. Marvis Repress Primary Care Stellar Gensel: Lynne Logan Other Clinician: Referring Mieka Leaton: Treating Aerie Donica/Extender: Lawerance Cruel Weeks in Treatment: 0 Wound Status Wound Number: 4 Primary Diabetic Wound/Ulcer of the Lower Extremity Etiology: Wound Location: Right T Great oe Wound Open Wounding Event: Gradually Appeared Status: Date Acquired: 02/15/2020 Comorbid Cataracts, Sleep Apnea, Arrhythmia, Hypertension, Peripheral Weeks Of Treatment: 0 History: Venous Disease, Type II Diabetes, Neuropathy Clustered Wound: No Wound Measurements Length: (cm) 2.2 Width: (cm) 2.5 Depth: (cm) 0.1 Area: (cm) 4.32 Volume: (cm) 0.432 % Reduction in Area: % Reduction in Volume: Epithelialization: None Tunneling: No Undermining: No Wound Description Classification: Grade 2 Wound Margin: Distinct, outline attached Exudate Amount: Medium Exudate  Type: Serosanguineous Exudate Color: red, brown Foul Odor After Cleansing: No Slough/Fibrino Yes Wound Bed Granulation Amount: Small (1-33%) Exposed Structure Granulation Quality: Pink Fascia Exposed: No Necrotic Amount: Large (67-100%) Fat Layer (Subcutaneous Tissue) Exposed: Yes Necrotic Quality: Eschar, Adherent Slough Tendon Exposed: No Muscle Exposed: No Joint Exposed: No Bone Exposed: No Treatment Notes Wound #4 (Right Toe Great) 3. Primary Dressing Applied Hydrofera Blue 4. Secondary Dressing Dry Gauze Roll Gauze 5. Secured With Tape 7. Footwear/Offloading device applied Felt/Foam Surgical shoe Electronic Signature(s) Signed: 03/13/2020 4:29:25 PM By: Kela Millin Entered By: Kela Millin on 03/13/2020 15:59:09 -------------------------------------------------------------------------------- Wound Assessment Details Patient Name: Date of Service: Joseph Fitz, PA UL C. 03/13/2020 2:45 PM Medical Record Number: [id number] Patient Account Number: [id number] Date of Birth/Sex: Treating RN: December 26, 1955 (64 y.o. Marvis Repress Primary Care Helvi Royals: Lynne Logan Other Clinician: Referring Naeem Quillin: Treating Teea Ducey/Extender: Lawerance Cruel Weeks in Treatment: 0 Wound Status Wound Number: 5 Primary Diabetic Wound/Ulcer of the Lower Extremity Etiology: Wound Location: Left T Great oe Wound Open Wounding Event: Gradually Appeared Status: Date Acquired: 02/19/2020 Comorbid Cataracts, Sleep Apnea, Arrhythmia, Hypertension, Peripheral Weeks Of Treatment: 0 History: Venous Disease, Type II Diabetes, Neuropathy Clustered Wound: No Wound Measurements Length: (cm) 1 Width: (cm) 0.8 Depth: (cm) 0.1 Area: (cm) 0.628 Volume: (cm) 0.063 % Reduction in Area: % Reduction in Volume: Epithelialization: None Tunneling: No Undermining: No Wound Description Classification: Grade 2 Wound Margin: Distinct, outline attached Exudate Amount:  Medium Exudate Type: Serosanguineous Exudate Color: red, brown Foul Odor After Cleansing: No Slough/Fibrino Yes Wound Bed Granulation Amount: Small (1-33%) Exposed Structure Granulation Quality: Pink Fascia Exposed: No Necrotic Amount: Large (67-100%) Fat Layer (Subcutaneous Tissue) Exposed: Yes Necrotic Quality: Adherent Slough Tendon Exposed: No Muscle Exposed: No Joint Exposed: No Bone Exposed: No Treatment Notes Wound #5 (Left Toe Great) 3. Primary Dressing Applied Hydrofera Blue 4. Secondary Dressing Dry Gauze Roll Gauze 5. Secured With Tape 7. Footwear/Offloading device applied Felt/Foam Surgical shoe Electronic Signature(s) Signed: 03/13/2020 4:29:25 PM By: Kela Millin Entered By: Kela Millin on 03/13/2020 16:00:34 -------------------------------------------------------------------------------- Vitals Details Patient Name: Date of Service: Joseph Fitz, PA UL C. 03/13/2020 2:45 PM Medical Record Number: [id number] Patient Account Number: [id number] Date of Birth/Sex: Treating RN: [birth year] (64 y.o. Marvis Repress Primary Care Brettney Ficken: Lynne Logan Other Clinician: Referring Latara Micheli: Treating  Kelson Queenan/Extender: Lawerance Cruel Weeks in Treatment: 0 Vital Signs Time Taken: 15:35 Temperature (F): 97.5 Height (in): 73 Pulse (bpm): 82 Source: Stated Respiratory Rate (breaths/min): 19 Weight (lbs): 470 Blood Pressure (mmHg): 193/47 Source: Stated Capillary Blood Glucose (mg/dl): 106 Body Mass Index (BMI): 62 Reference Range: 80 - 120 mg / dl Notes patient stated CBG this morning was 106 Electronic Signature(s) Signed: 03/13/2020 4:29:25 PM By: Kela Millin Entered By: Kela Millin on 03/13/2020 15:40:39

## 2020-03-14 NOTE — Progress Notes (Signed)
DESTYN, SCHUYLER ([id number]) Visit Report for 03/13/2020 Chief Complaint Document Details Patient Name: Date of Service: Joseph [name], Joseph [name] 03/13/2020 2:45 PM Medical Record Number: [id number] Patient Account Number: [id number] Date of Birth/Sex: Treating RN: Aug 19, 1956 (64 y.o. Joseph [name] Primary Care Provider: Lynne [name] Other Clinician: Referring Provider: Treating Provider/Extender: Joseph [name] Weeks in Treatment: 0 Information Obtained from: Patient Chief Complaint 03/13/2020; this is a patient who is here for review of wounds on his bilateral great toes Electronic Signature(s) Signed: 03/14/2020 7:45:08 AM By: Joseph Ham MD Entered By: Joseph [name] on 03/13/2020 17:39:18 -------------------------------------------------------------------------------- Debridement Details Patient Name: Date of Service: Joseph Fitz, PA UL C. 03/13/2020 2:45 PM Medical Record Number: [id number] Patient Account Number: [id number] Date of Birth/Sex: Treating RN: 1956/03/17 (64 y.o. Joseph [name] Primary Care Provider: Lynne [name] Other Clinician: Referring Provider: Treating Provider/Extender: Joseph [name] in Treatment: 0 Debridement Performed for Assessment: Wound #5 Left T Great oe Performed By: Clinician Joseph Millin, RN Debridement Type: Chemical/Enzymatic/Mechanical Agent Used: gauze and wound cleanser Severity of Tissue Pre Debridement: Fat layer exposed Level of Consciousness (Pre-procedure): Awake and Alert Pre-procedure Verification/Time Out Yes - 16:20 Taken: Start Time: 16:21 Pain Control: Lidocaine 4% Topical Solution Instrument: Curette Bleeding: Minimum Hemostasis Achieved: Pressure End Time: 16:23 Procedural Pain: 0 Post Procedural Pain: 0 Response to Treatment: Procedure was tolerated well Level of Consciousness (Post- Awake and Alert procedure): Post Debridement Measurements of Total Wound Length: (cm) 1 Width:  (cm) 0.8 Depth: (cm) 0.1 Volume: (cm) 0.063 Character of Wound/Ulcer Post Debridement: Improved Severity of Tissue Post Debridement: Fat layer exposed Post Procedure Diagnosis Same as Pre-procedure Electronic Signature(s) Signed: 03/13/2020 5:45:21 PM By: Joseph [name] Signed: 03/14/2020 7:45:08 AM By: Joseph Ham MD Entered By: Joseph [name] on 03/13/2020 16:45:46 -------------------------------------------------------------------------------- Debridement Details Patient Name: Date of Service: Joseph Fitz, PA UL C. 03/13/2020 2:45 PM Medical Record Number: [id number] Patient Account Number: [id number] Date of Birth/Sex: Treating RN: 1956/01/26 (64 y.o. Joseph [name] Primary Care Provider: Lynne [name] Other Clinician: Referring Provider: Treating Provider/Extender: Joseph [name] in Treatment: 0 Debridement Performed for Assessment: Wound #4 Right T Great oe Performed By: Physician Joseph Dillon., MD Debridement Type: Debridement Severity of Tissue Pre Debridement: Fat layer exposed Level of Consciousness (Pre-procedure): Awake and Alert Pre-procedure Verification/Time Out Yes - 16:20 Taken: Start Time: 16:21 Pain Control: Lidocaine 4% T opical Solution T Area Debrided (L x W): otal 2.2 (cm) x 2.5 (cm) = 5.5 (cm) Tissue and other material debrided: Viable, Non-Viable, Slough, Subcutaneous, Skin: Dermis , Fibrin/Exudate, Slough Level: Skin/Subcutaneous Tissue Debridement Description: Excisional Instrument: Curette Bleeding: Minimum Hemostasis Achieved: Pressure End Time: 16:23 Procedural Pain: 0 Post Procedural Pain: 0 Response to Treatment: Procedure was tolerated well Level of Consciousness (Post- Awake and Alert procedure): Post Debridement Measurements of Total Wound Length: (cm) 2.2 Width: (cm) 2.5 Depth: (cm) 0.1 Volume: (cm) 0.432 Character of Wound/Ulcer Post Debridement: Improved Severity of Tissue Post Debridement: Fat layer  exposed Post Procedure Diagnosis Same as Pre-procedure Electronic Signature(s) Signed: 03/13/2020 5:45:21 PM By: Joseph [name] Signed: 03/14/2020 7:45:08 AM By: Joseph Ham MD Entered By: Joseph [name] on 03/13/2020 17:36:34 -------------------------------------------------------------------------------- HPI Details Patient Name: Date of Service: Joseph Fitz, PA UL C. 03/13/2020 2:45 PM Medical Record Number: [id number] Patient Account Number: [id number] Date of Birth/Sex: Treating RN: [birth year] (64 y.o. Joseph [name] Primary Care Provider: Other Clinician: Lynne [name] Referring Provider: Treating Provider/Extender: Joseph [name],  Joseph [name] Weeks in Treatment: 0 History of Present Illness HPI Description: ADMISSION 03/13/20 This is a 64 year old man with type 2 diabetes and peripheral neuropathy. He was previously in this wound care clinic in 2014 and 2015 I think for lower extremity wounds related to chronic venous insufficiency. His current problem started in May. He noticed a wound and erythema on the right great toe which was small but then expanded. He thinks this may have initially been caused by trauma from new shoes. He was in the emergency department on May 21 with painful erythematous right great toe. He had an x-ray that was negative there was no culture done. He was given clindamycin. About a week later he developed an area on the left first toe although this is on the plantar aspect. He was able to show me pictures of the right great toe on cell phone. This was clearly an infection which probably resulted in a significant amount of tissue destruction. Since then he is been seen by Dr. Cannon Kettle of the podiatry. He is had at least 2 debridements of these areas. He has been using Betadine and dry dressings. She is also been putting peroxide on the wound area. Patient was seen in consultation by Dr. Donnetta Hutching of vein and vascular. The patient had noninvasive arterial studies  showing noncompressible ABIs on the right with an ABI of 1.34 but triphasic waveforms. On the left again 1.34 with triphasic waveforms. TBI's were not done because the great toes were bandaged bilaterally. The patient saw Dr. Donnetta Hutching in consultation on 6/1. He did not feel that arterial issues were going to be of significance to impaired healing of the wounds on the toes. He has recurrent areas of venous stasis disease with wounds on his legs although he has no open wounds currently. He had juxta lite stockings at one point but they became too old and he has not found a way to get them replaced. 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 03/13/2020 2:45 PM Medical Record Number: [id number] Patient Account Number: [id number] Date of Birth/Sex: Treating RN: [birth year] (64 y.o. Joseph [name] Primary Care Provider: Lynne [name] Other Clinician: Referring Provider: Treating Provider/Extender: Joseph [name] Weeks in Treatment: 0 Constitutional Patient is hypertensive.. Pulse regular and within target range for patient.Marland Kitchen Respirations regular, non-labored and within target range.. Temperature is normal and within the target range for the patient.Marland Kitchen Appears in no distress. Cardiovascular Pedal pulses were easily palpable bilaterally. Severe hemosiderin deposition bilaterally in his lower legs. There is not significant uncontrolled edema which is fortunate.. Integumentary (Hair, Skin) Other than chronic venous insufficiency with hemosiderin deposition no other primary skin issues are  seen. Neurological Diabetic insensate neuropathy. Psychiatric appears at normal baseline. Notes Wound exam Right great toe. The patient has a full-thickness loss of tissue over the tip of his right great toe. Surface of the wound in this area is not viable. Very gritty surface. Thickened skin around the wound area. Using a #5 curette I debrided the surface of the wound hemostasis with silver nitrate. Also removed thick skin and callus from around the wound margins On the plantar left great toe at the tip there is a full-thickness wound as  well but this is not nearly as ominous is on the right. Slightly hyper granulated. No debridement was done in this area Electronic Signature(s) Signed: 03/14/2020 7:45:08 AM By: Joseph Ham MD Entered By: Joseph [name] on 03/13/2020 17:48:49 -------------------------------------------------------------------------------- Physician Orders Details Patient Name: Date of Service: Joseph Fitz, PA UL C. 03/13/2020 2:45 PM Medical Record Number: [id number] Patient Account Number: [id number] Date of Birth/Sex: Treating RN: Jan 25, 1956 (64 y.o. Joseph [name] Primary Care Provider: Lynne [name] Other Clinician: Referring Provider: Treating Provider/Extender: Joseph [name] Weeks in Treatment: 0 Verbal / Phone Orders: No Diagnosis Coding Follow-up Appointments ppointment in 1 week. - Friday Return A Dressing Change Frequency Change Dressing every other day. Skin Barriers/Peri-Wound Care Moisturizing lotion - both leg every night. Wound Cleansing May shower with protection. - use cast protector when not changing the dressings. May shower and wash wound with soap and water. - with dressing changes. Primary Wound Dressing Wound #4 Right T Great oe Hydrofera Blue - classic, moisten with saline. Wound #5 Left T Great oe Hydrofera Blue - classic, moisten with saline. Secondary Dressing Kerlix/Rolled Gauze Dry Gauze Edema  Control Avoid standing for long periods of time Elevate legs to the level of the heart or above for 30 minutes daily and/or when sitting, a frequency of: - throughout the day. Support Garment 30-40 mm/Hg pressure to: - juxtalite HD for bilateral lower legs. Wound center to order for both legs. Off-Loading Open toe surgical shoe to: - patient to wear all the time while walking both leg. apply felt to surgical shoes to offload pressure to toes. Electronic Signature(s) Signed: 03/13/2020 5:45:21 PM By: Joseph [name] Signed: 03/14/2020 7:45:08 AM By: Joseph Ham MD Entered By: Joseph [name] on 03/13/2020 16:32:36 -------------------------------------------------------------------------------- Problem List Details Patient Name: Date of Service: Joseph Fitz, PA UL C. 03/13/2020 2:45 PM Medical Record Number: [id number] Patient Account Number: [id number] Date of Birth/Sex: Treating RN: 10-31-1955 (64 y.o. 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 03/13/2020 2:45 PM Medical Record Number:  [id number] Patient Account Number: [id number] Date of Birth/Sex: Treating RN: September 28, 1955 (65 y.o. Joseph [name] Primary Care Provider: Lynne [name] Other Clinician: Referring Provider: Treating Provider/Extender: Joseph [name] Weeks in Treatment: 0 Subjective Chief Complaint Information obtained from Patient 03/13/2020; this is a patient who is here for review of wounds on his bilateral great toes History of Present Illness (HPI) ADMISSION 03/13/20 This is a 64 year old man with type 2 diabetes and peripheral neuropathy. He was previously in this wound care clinic in 2014 and 2015 I think for lower extremity wounds related to chronic venous insufficiency. His current problem started in May. He noticed a wound and erythema on the right great toe which was small but then expanded. He thinks this may have initially been  caused by trauma from new shoes. He was in the emergency department on May 21 with painful erythematous right great toe. He had an x-ray that was negative there was no culture done. He was given clindamycin. About a week later he developed an area on the left first toe although this is on the plantar aspect. He was able to show me pictures of the right great toe on cell phone. This was clearly an infection which probably resulted in a significant amount of tissue destruction. Since then he is been seen by Dr. Cannon Kettle of the podiatry. He is had at least 2 debridements of these areas. He has been using Betadine and dry dressings. She is also been putting peroxide on the wound area. Patient was seen in consultation by Dr. Donnetta Hutching of vein and vascular. The patient had noninvasive arterial studies showing noncompressible ABIs on the right with an ABI of 1.34 but triphasic waveforms. On the left again 1.34 with triphasic waveforms. TBI's were not done because the great toes were bandaged bilaterally. The patient saw Dr. Donnetta Hutching in consultation on 6/1. He did not feel that  arterial issues were going to be of significance to impaired healing of the wounds on the toes. He has recurrent areas of venous stasis disease with wounds on his legs although he has no open wounds currently. He had juxta lite stockings at one point but they became too old and he has not found a way to get them replaced. Past medical history; the patient has a history of PE and is on chronic Coumadin, obstructive sleep apnea, type 2 diabetes with peripheral neuropathy, hypertension Patient History Information obtained from Patient. Allergies adhesive Family History Diabetes - Mother,Maternal Grandparents, Heart Disease - Paternal Grandparents, Hypertension - Father, Lung Disease - Siblings, Stroke - Paternal Grandparents, No family history of Cancer, Hereditary Spherocytosis, Kidney Disease, Seizures, Thyroid Problems, Tuberculosis. Social History Never smoker, Marital Status - Married, Alcohol Use - Never, Drug Use - No History, Caffeine Use - Daily. Medical History Eyes Patient has history of Cataracts Ear/Nose/Mouth/Throat Denies history of Chronic sinus problems/congestion, Middle ear problems Hematologic/Lymphatic Denies history of Anemia, Hemophilia, Human Immunodeficiency Virus, Lymphedema, Sickle Cell Disease Respiratory Patient has history of Sleep Apnea Denies history of Aspiration, Asthma, Chronic Obstructive Pulmonary Disease (COPD), Pneumothorax, Tuberculosis Cardiovascular Patient has history of Arrhythmia, Hypertension, Peripheral Venous Disease Denies history of Angina, Congestive Heart Failure, Coronary Artery Disease, Deep Vein Thrombosis, Hypotension, Myocardial Infarction, Peripheral Arterial Disease, Phlebitis, Vasculitis Gastrointestinal Denies history of Cirrhosis , Colitis, Crohnoos, Hepatitis A, Hepatitis B, Hepatitis C Endocrine Patient has history of Type II Diabetes Denies history of Type I Diabetes Genitourinary Denies history of End Stage Renal  Disease Immunological Denies history of Lupus Erythematosus, Raynaudoos, Scleroderma Musculoskeletal Denies history of Gout, Rheumatoid Arthritis, Osteoarthritis, Osteomyelitis Neurologic Patient has history of Neuropathy Denies history of Dementia, Quadriplegia, Paraplegia, Seizure Disorder Oncologic Denies history of Received Chemotherapy, Received Radiation Psychiatric Denies history of Anorexia/bulimia, Confinement Anxiety Patient is treated with Insulin, Oral Agents. Blood sugar is tested. Medical A Surgical History Notes nd Ear/Nose/Mouth/Throat ringing in the ears Respiratory uses CPap PE Cardiovascular DVT and PE Integumentary (Skin) panniculitis lower legs cellulitis Review of Systems (ROS) Constitutional Symptoms (General Health) Denies complaints or symptoms of Fatigue, Fever, Chills, Marked Weight Change. Eyes Complains or has symptoms of Glasses / Contacts - glasses. Ear/Nose/Mouth/Throat Denies complaints or symptoms of Chronic sinus problems or rhinitis. Respiratory Denies complaints or symptoms of Chronic or frequent coughs, Shortness of Breath. Cardiovascular Denies complaints  or symptoms of Chest pain. Gastrointestinal Denies complaints or symptoms of Frequent diarrhea, Nausea, Vomiting. Genitourinary Denies complaints or symptoms of Frequent urination. Integumentary (Skin) Complains or has symptoms of Wounds, toes Musculoskeletal Denies complaints or symptoms of Muscle Pain, Muscle Weakness. Neurologic Denies complaints or symptoms of Numbness/parasthesias. Psychiatric Denies complaints or symptoms of Claustrophobia, Suicidal. Objective Constitutional Patient is hypertensive.. Pulse regular and within target range for patient.Marland Kitchen Respirations regular, non-labored and within target range.. Temperature is normal and within the target range for the patient.Marland Kitchen Appears in no distress. Vitals Time Taken: 3:35 PM, Height: 73 in, Source: Stated, Weight: 470  lbs, Source: Stated, BMI: 62, Temperature: 97.5 F, Pulse: 82 bpm, Respiratory Rate: 19 breaths/min, Blood Pressure: 193/47 mmHg, Capillary Blood Glucose: 106 mg/dl. General Notes: patient stated CBG this morning was 106 Cardiovascular Pedal pulses were easily palpable bilaterally. Severe hemosiderin deposition bilaterally in his lower legs. There is not significant uncontrolled edema which is fortunate.. Neurological Diabetic insensate neuropathy. Psychiatric appears at normal baseline. General Notes: Wound exam ooRight great toe. The patient has a full-thickness loss of tissue over the tip of his right great toe. Surface of the wound in this area is not viable. Very gritty surface. Thickened skin around the wound area. Using a #5 curette I debrided the surface of the wound hemostasis with silver nitrate. Also removed thick skin and callus from around the wound margins ooOn the plantar left great toe at the tip there is a full-thickness wound as well but this is not nearly as ominous is on the right. Slightly hyper granulated. No debridement was done in this area Integumentary (Hair, Skin) Other than chronic venous insufficiency with hemosiderin deposition no other primary skin issues are seen. Wound #4 status is Open. Original cause of wound was Gradually Appeared. The wound is located on the Right T Great. The wound measures 2.2cm length x oe 2.5cm width x 0.1cm depth; 4.32cm^2 area and 0.432cm^3 volume. There is Fat Layer (Subcutaneous Tissue) Exposed exposed. There is no tunneling or undermining noted. There is a medium amount of serosanguineous drainage noted. The wound margin is distinct with the outline attached to the wound base. There is small (1-33%) pink granulation within the wound bed. There is a large (67-100%) amount of necrotic tissue within the wound bed including Eschar and Adherent Slough. Wound #5 status is Open. Original cause of wound was Gradually Appeared. The wound  is located on the Left T Great. The wound measures 1cm length x oe 0.8cm width x 0.1cm depth; 0.628cm^2 area and 0.063cm^3 volume. There is Fat Layer (Subcutaneous Tissue) Exposed exposed. There is no tunneling or undermining noted. There is a medium amount of serosanguineous drainage noted. The wound margin is distinct with the outline attached to the wound base. There is small (1-33%) pink granulation within the wound bed. There is a large (67-100%) amount of necrotic tissue within the wound bed including Adherent Slough. 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 There was a Excisional Skin/Subcutaneous Tissue Debridement with a total area of 5.5 sq cm performed by Joseph Dillon., MD. With  the following instrument(s): Curette to remove Viable and Non-Viable tissue/material. Material removed includes Subcutaneous Tissue, Slough, Skin: Dermis, and Fibrin/Exudate after achieving pain control using Lidocaine 4% Topical Solution. A time out was conducted at 16:20, prior to the start of the procedure. A Minimum amount of bleeding was controlled with Pressure. The procedure was tolerated well with a pain level of 0 throughout and a pain level of 0 following the procedure. Post Debridement Measurements: 2.2cm length x 2.5cm width x 0.1cm depth; 0.432cm^3 volume. Character of Wound/Ulcer Post Debridement is improved. Severity of Tissue Post Debridement is: Fat layer exposed. Post procedure Diagnosis Wound #4: Same as  Pre-Procedure Wound #5 Pre-procedure diagnosis of Wound #5 is a Diabetic Wound/Ulcer of the Lower Extremity located on the Left T Great .Severity of Tissue Pre Debridement is: oe Fat layer exposed. There was a Chemical/Enzymatic/Mechanical debridement performed by Joseph Millin, RN. With the following instrument(s): Curette to remove Viable and Non-Viable tissue/material. Material removed includes Subcutaneous Tissue, Slough, Skin: Dermis, and Fibrin/Exudate after achieving pain control using Lidocaine 4% Topical Solution. Other agent used was gauze and wound cleanser. A time out was conducted at 16:20, prior to the start of the procedure. A Minimum amount of bleeding was controlled with Pressure. The procedure was tolerated well with a pain level of 0 throughout and a pain level of 0 following the procedure. Post Debridement Measurements: 1cm length x 0.8cm width x 0.1cm depth; 0.063cm^3 volume. Character of Wound/Ulcer Post Debridement is improved. Severity of Tissue Post Debridement is: Fat layer exposed. Post procedure Diagnosis Wound #5: Same as Pre-Procedure Plan Follow-up Appointments: Return Appointment in 1 week. - Friday Dressing Change Frequency: Change Dressing every other day. Skin Barriers/Peri-Wound Care: Moisturizing lotion - both leg every night. Wound Cleansing: May shower with protection. - use cast protector when not changing the dressings. May shower and wash wound with soap and water. - with dressing changes. Primary Wound Dressing: Wound #4 Right T Great: oe Hydrofera Blue - classic, moisten with saline. Wound #5 Left T Great: oe Hydrofera Blue - classic, moisten with saline. Secondary Dressing: Kerlix/Rolled Gauze Dry Gauze Edema Control: Avoid standing for long periods of time Elevate legs to the level of the heart or above for 30 minutes daily and/or when sitting, a frequency of: - throughout the day. Support Garment 30-40 mm/Hg pressure to: - juxtalite  HD for bilateral lower legs. Wound center to order for both legs. Off-Loading: Open toe surgical shoe to: - patient to wear all the time while walking both leg. apply felt to surgical shoes to offload pressure to toes. 1. Really an extensive wound on the tip of his right great toe that encompasses the nailbed. I think this started as a significant soft tissue infection with extensive tissue loss. I think things may be complicated by the use of peroxide. The right toe required a reasonably extensive debridement and I wanted to continue with something to add some debridement properties I therefore used Hydrofera Blue. We will also use this on his left great toe. 2. Really no evidence of arterial insufficiency which was certainly a concern listening to this history. Is already seen Dr. Donnetta Hutching. Although they did not do TBI's which is disappointing. He has vibrant peripheral pulses his forefoot is warm good capillary refill 3. The patient has severe chronic venous insufficiency and has had problems with wounds in this area. We went ahead and order him bilateral juxta lites although there is no open wounds in this area. He will need  to pay for these out-of-pocket. However I do not think this has anything to do with the wounds in the current location. 4. We used Hydrofera Blue and gauze. We gave him bilateral surgical shoes. I saw no evidence of current infection he is finished with the clindamycin. I did no additional cultures or antibiotics Electronic Signature(s) Signed: 03/14/2020 7:45:08 AM By: Joseph Ham MD Entered By: Joseph [name] on 03/13/2020 17:51:48 -------------------------------------------------------------------------------- HxROS Details Patient Name: Date of Service: Joseph Fitz, PA UL C. 03/13/2020 2:45 PM Medical Record Number: [id number] Patient Account Number: [id number] Date of Birth/Sex: Treating RN: 1956-02-03 (64 y.o. Marvis Repress Primary Care Provider: Lynne [name]  Other Clinician: Referring Provider: Treating Provider/Extender: Joseph [name] Weeks in Treatment: 0 Information Obtained From Patient Constitutional Symptoms (General Health) Complaints and Symptoms: Negative for: Fatigue; Fever; Chills; Marked Weight Change Eyes Complaints and Symptoms: Positive for: Glasses / Contacts - glasses Medical History: Positive for: Cataracts Ear/Nose/Mouth/Throat Complaints and Symptoms: Negative for: Chronic sinus problems or rhinitis Medical History: Negative for: Chronic sinus problems/congestion; Middle ear problems Past Medical History Notes: ringing in the ears Respiratory Complaints and Symptoms: Negative for: Chronic or frequent coughs; Shortness of Breath Medical History: Positive for: Sleep Apnea Negative for: Aspiration; Asthma; Chronic Obstructive Pulmonary Disease (COPD); Pneumothorax; Tuberculosis Past Medical History Notes: uses CPap PE Cardiovascular Complaints and Symptoms: Negative for: Chest pain Medical History: Positive for: Arrhythmia; Hypertension; Peripheral Venous Disease Negative for: Angina; Congestive Heart Failure; Coronary Artery Disease; Deep Vein Thrombosis; Hypotension; Myocardial Infarction; Peripheral Arterial Disease; Phlebitis; Vasculitis Past Medical History Notes: DVT and PE Gastrointestinal Complaints and Symptoms: Negative for: Frequent diarrhea; Nausea; Vomiting Medical History: Negative for: Cirrhosis ; Colitis; Crohns; Hepatitis A; Hepatitis B; Hepatitis C Genitourinary Complaints and Symptoms: Negative for: Frequent urination Medical History: Negative for: End Stage Renal Disease Integumentary (Skin) Complaints and Symptoms: Positive for: Wounds Review of System Notes: toes Medical History: Past Medical History Notes: panniculitis lower legs cellulitis Musculoskeletal Complaints and Symptoms: Negative for: Muscle Pain; Muscle Weakness Medical History: Negative  for: Gout; Rheumatoid Arthritis; Osteoarthritis; Osteomyelitis Neurologic Complaints and Symptoms: Negative for: Numbness/parasthesias Medical History: Positive for: Neuropathy Negative for: Dementia; Quadriplegia; Paraplegia; Seizure Disorder Psychiatric Complaints and Symptoms: Negative for: Claustrophobia; Suicidal Medical History: Negative for: Anorexia/bulimia; Confinement Anxiety Hematologic/Lymphatic Medical History: Negative for: Anemia; Hemophilia; Human Immunodeficiency Virus; Lymphedema; Sickle Cell Disease Endocrine Medical History: Positive for: Type II Diabetes Negative for: Type I Diabetes Time with diabetes: 66 years Treated with: Insulin, Oral agents Blood sugar tested every day: Yes Tested : Immunological Medical History: Negative for: Lupus Erythematosus; Raynauds; Scleroderma Oncologic Medical History: Negative for: Received Chemotherapy; Received Radiation HBO Extended History Items Eyes: Cataracts Immunizations Pneumococcal Vaccine: Received Pneumococcal Vaccination: No Implantable Devices None Family and Social History Cancer: No; Diabetes: Yes - Mother,Maternal Grandparents; Heart Disease: Yes - Paternal Grandparents; Hereditary Spherocytosis: No; Hypertension: Yes - Father; Kidney Disease: No; Lung Disease: Yes - Siblings; Seizures: No; Stroke: Yes - Paternal Grandparents; Thyroid Problems: No; Tuberculosis: No; Never smoker; Marital Status - Married; Alcohol Use: Never; Drug Use: No History; Caffeine Use: Daily; Financial Concerns: No; Food, Clothing or Shelter Needs: No; Support System Lacking: No; Transportation Concerns: No Electronic Signature(s) Signed: 03/13/2020 4:29:25 PM By: Joseph [name] Signed: 03/14/2020 7:45:08 AM By: Joseph Ham MD Entered By: Joseph [name] on 03/13/2020 15:48:48 -------------------------------------------------------------------------------- SuperBill Details Patient Name: Date of Service: Joseph Fitz, PA  UL C. 03/13/2020 Medical Record Number: [id number] Patient Account Number: [id number] Date of Birth/Sex: Treating RN: 08/01/1956 (63  y.o. 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 I87.303 Chronic venous hypertension (idiopathic) without complications of bilateral lower extremity Facility Procedures CPT4 Code: [id number] Description: 99213 - WOUND CARE VISIT-[place] 3 [place] [address] - DEB SUBQ TISSUE 20 SQ CM/< ICD-10 Diagnosis Description E11.621 Type 2 diabetes mellitus with foot ulcer L97.518 Non-pressure chronic ulcer of other part of right foot with other specified Modifier: severity Quantity: 1 1 Physician Procedures : CPT4 Code Description Modifier [id number] WC PHYS LEVEL 3 NEW PT 25 ICD-10 Diagnosis Description E11.621 Type 2 diabetes mellitus with foot ulcer L97.518 Non-pressure chronic ulcer of other part of right foot with other specified severity L97.523  Non-pressure chronic ulcer of other part of left foot with necrosis of muscle I87.303 Chronic venous hypertension (idiopathic) without complications of bilateral lower extremity Quantity: 1 : 1980221 79810 - WC PHYS SUBQ TISS 20 SQ CM ICD-10 Diagnosis Description E11.621 Type 2 diabetes mellitus with foot ulcer L97.518 Non-pressure chronic ulcer of other part of right foot with other specified severity Quantity: 1 Electronic Signature(s) Signed: 03/14/2020 7:45:08 AM By: Joseph Ham MD Previous Signature: 03/13/2020 5:45:21 PM Version By: Joseph [name] Entered By: Joseph [name] on 03/13/2020 17:53:08

## 2020-03-21 ENCOUNTER — Encounter (HOSPITAL_BASED_OUTPATIENT_CLINIC_OR_DEPARTMENT_OTHER): Payer: PPO | Admitting: Internal Medicine

## 2020-03-21 DIAGNOSIS — I87303 Chronic venous hypertension (idiopathic) without complications of bilateral lower extremity: Secondary | ICD-10-CM | POA: Diagnosis not present

## 2020-03-21 DIAGNOSIS — E11621 Type 2 diabetes mellitus with foot ulcer: Secondary | ICD-10-CM | POA: Diagnosis not present

## 2020-03-21 DIAGNOSIS — L97522 Non-pressure chronic ulcer of other part of left foot with fat layer exposed: Secondary | ICD-10-CM | POA: Diagnosis not present

## 2020-03-21 DIAGNOSIS — L97512 Non-pressure chronic ulcer of other part of right foot with fat layer exposed: Secondary | ICD-10-CM | POA: Diagnosis not present

## 2020-03-21 DIAGNOSIS — G4733 Obstructive sleep apnea (adult) (pediatric): Secondary | ICD-10-CM | POA: Diagnosis not present

## 2020-03-21 NOTE — Progress Notes (Signed)
NIMA, KEMPPAINEN ([id number]) Visit Report for 03/21/2020 Debridement Details Patient Name: Date of Service: [name], [name] 03/21/2020 1:15 PM Medical Record Number: [id number] Patient Account Number: [id number] Date of Birth/Sex: Treating RN: 06-Feb-1956 (65 y.o. Marvis Repress Primary Care Provider: Lynne Logan Other Clinician: Referring Provider: Treating Provider/Extender: Lorenza Evangelist in Treatment: 1 Debridement Performed for Assessment: Wound #4 Right T Great oe Performed By: Physician Ricard Dillon., MD Debridement Type: Debridement Severity of Tissue Pre Debridement: Fat layer exposed Level of Consciousness (Pre-procedure): Awake and Alert Pre-procedure Verification/Time Out Yes - 14:50 Taken: Start Time: 14:50 Pain Control: Other : benzocaine 20% spray T Area Debrided (L x W): otal 2.4 (cm) x 2.5 (cm) = 6 (cm) Tissue and other material debrided: Viable, Non-Viable, Eschar, Subcutaneous Level: Skin/Subcutaneous Tissue Debridement Description: Excisional Instrument: Curette Specimen: Swab, Number of Specimens T aken: 1 Bleeding: Moderate Hemostasis Achieved: Silver Nitrate End Time: 14:51 Procedural Pain: 0 Post Procedural Pain: 0 Response to Treatment: Procedure was tolerated well Level of Consciousness (Post- Awake and Alert procedure): Post Debridement Measurements of Total Wound Length: (cm) 2.4 Width: (cm) 2.5 Depth: (cm) 0.1 Volume: (cm) 0.471 Character of Wound/Ulcer Post Debridement: Improved Severity of Tissue Post Debridement: Fat layer exposed Post Procedure Diagnosis Same as Pre-procedure Electronic Signature(s) Signed: 03/21/2020 5:01:22 PM By: Kela Millin Signed: 03/21/2020 5:27:17 PM By: Linton Ham MD Entered By: Linton Ham on 03/21/2020 15:01:30 -------------------------------------------------------------------------------- HPI Details Patient Name: Date of Service: [name], PA UL C. 03/21/2020  1:15 PM Medical Record Number: [id number] Patient Account Number: [id number] Date of Birth/Sex: Treating RN: 1956/06/12 (64 y.o. Marvis Repress Primary Care Provider: Lynne Logan Other Clinician: Referring Provider: Treating Provider/Extender: Lawerance Cruel Weeks in Treatment: 1 History of Present Illness HPI Description: ADMISSION 03/13/20 This is a 64 year old man with type 2 diabetes and peripheral neuropathy. He was previously in this wound care clinic in 2014 and 2015 I think for lower extremity wounds related to chronic venous insufficiency. His current problem started in May. He noticed a wound and erythema on the right great toe which was small but then expanded. He thinks this may have initially been caused by trauma from new shoes. He was in the emergency department on May 21 with painful erythematous right great toe. He had an x-ray that was negative there was no culture done. He was given clindamycin. About a week later he developed an area on the left first toe although this is on the plantar aspect. He was able to show me pictures of the right great toe on cell phone. This was clearly an infection which probably resulted in a significant amount of tissue destruction. Since then he is been seen by Dr. Cannon Kettle of the podiatry. He is had at least 2 debridements of these areas. He has been using Betadine and dry dressings. She is also been putting peroxide on the wound area. Patient was seen in consultation by Dr. Donnetta Hutching of vein and vascular. The patient had noninvasive arterial studies showing noncompressible ABIs on the right with an ABI of 1.34 but triphasic waveforms. On the left again 1.34 with triphasic waveforms. TBI's were not done because the great toes were bandaged bilaterally. The patient saw Dr. Donnetta Hutching in consultation on 6/1. He did not feel that arterial issues were going to be of significance to impaired healing of the wounds on the toes. He has  recurrent areas of venous stasis disease with wounds on his  legs although he has no open wounds currently. He had juxta lite stockings at one point but they became too old and he has not found a way to get them replaced. 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 Some mild erythema. He also has an area in the tip of the left first toe Electronic Signature(s) Signed: 03/21/2020 5:27:17 PM By: Linton Ham MD Entered By: Linton Ham on 03/21/2020 15:03:04 -------------------------------------------------------------------------------- Physical Exam Details Patient Name: Date of Service: [name], PA UL C. 03/21/2020 1:15 PM Medical Record Number: [id number] Patient Account Number: [id number] Date of Birth/Sex: Treating RN: 1956-04-08 (64 y.o. Marvis Repress Primary Care Provider: Lynne Logan Other Clinician: Referring Provider: Treating Provider/Extender: Lawerance Cruel Weeks in Treatment: 1 Constitutional Patient is hypertensive.. Pulse regular and within target range for patient.Marland Kitchen Respirations regular, non-labored and within target range.. Temperature is normal and within the target range for the patient.Marland Kitchen Appears in no distress. Notes Wound exam Right great toe. Necrotic surface fair amount of tissue damage. I really did not like the way this looked it does not probe to bone however I cannot really see why there is so much necrosis. Your was debrided with a #5 curette. Post debridement culture was done. On the tip of the left great toe a wound area on the plantar surface. Electronic Signature(s) Signed: 03/21/2020 5:27:17 PM By: Linton Ham MD Entered By: Linton Ham on 03/21/2020  15:04:05 -------------------------------------------------------------------------------- Physician Orders Details Patient Name: Date of Service: [name], PA UL C. 03/21/2020 1:15 PM Medical Record Number: [id number] Patient Account Number: [id number] Date of Birth/Sex: Treating RN: Dec 09, 1955 (64 y.o. 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 - Friday Return A Dressing Change Frequency Change dressing every day. Skin Barriers/Peri-Wound Care Moisturizing lotion - both leg every night. Wound Cleansing May shower with protection. - use cast protector when not changing the dressings. May shower and wash wound with soap and water. - with dressing changes. Primary Wound Dressing Wound #4 Right T Great oe Calcium Alginate with Silver Wound #5 Left T Great oe Calcium Alginate with Silver Secondary Dressing Kerlix/Rolled Gauze Dry Gauze Edema Control Avoid standing for long periods of time Elevate legs to the level of the heart or above for 30 minutes daily and/or when sitting, a frequency of: - throughout the day. Support Garment 30-40 mm/Hg pressure to: - juxtalite HD for bilateral lower legs. Wound center to order for both legs. Off-Loading Open toe surgical shoe to: - patient to wear all the time while walking both leg. apply felt to surgical shoes to offload pressure to toes. Laboratory naerobe culture  (MICRO) - Wound culture of right great toe - (ICD10 L97.518 - Non-pressure Bacteria identified in Unspecified specimen by A chronic ulcer of other part of right foot with other specified severity) LOINC Code:  635-3 Convenience Name: Anerobic culture Patient Medications llergies: adhesive A Notifications Medication Indication Start End doxycycline monohydrate DOSE oral 100 mg capsule - 1 capsule oral bid for 7 days Electronic Signature(s) Signed: 03/21/2020 3:00:01 PM By: Linton Ham MD Entered By: Linton Ham on 03/21/2020 15:00:00 -------------------------------------------------------------------------------- Problem List Details Patient Name: Date of Service: [name], PA UL C. 03/21/2020 1:15 PM Medical Record Number: [id number] Patient Account Number: [id number] Date of Birth/Sex: Treating RN: [birth year] (64 y.o. Marvis Repress Primary Care Provider: Lynne Logan Other Clinician: Referring Provider: Treating Provider/Extender: Lawerance Cruel Weeks in Treatment: 1 Active Problems ICD-10 Encounter Code Description Active Date MDM Diagnosis E11.621 Type 2 diabetes mellitus with foot ulcer 03/13/2020 No Yes L97.518 Non-pressure chronic ulcer of other part of right foot with other specified 03/13/2020 No Yes severity L97.523 Non-pressure chronic ulcer of other part of left foot with necrosis of muscle 03/13/2020 No Yes I87.303 Chronic venous hypertension (idiopathic) without complications of bilateral 03/13/2020 No Yes lower extremity Inactive Problems Resolved Problems Electronic Signature(s) Signed: 03/21/2020 5:27:17 PM By: Linton Ham MD Entered By: Linton Ham on 03/21/2020 15:01:01 -------------------------------------------------------------------------------- Progress Note Details Patient Name: Date of Service: [name], PA UL C. 03/21/2020 1:15 PM Medical Record Number: [id number] Patient Account Number: [id number] Date of  Birth/Sex: Treating RN: 01-Aug-1956 (64 y.o. Marvis Repress Primary Care Provider: Lynne Logan Other Clinician: Referring Provider: Treating Provider/Extender: Lawerance Cruel Weeks in Treatment: 1 Subjective History of Present Illness (HPI) ADMISSION 03/13/20 This is a 64 year old man with type 2 diabetes and peripheral neuropathy. He was previously in this wound care clinic in 2014 and 2015 I think for lower extremity wounds related to chronic venous insufficiency. His current problem started in May. He noticed a wound and erythema on the right great toe which was small but then expanded. He thinks this may have initially been caused by trauma from new shoes. He was in the emergency department on May 21 with painful erythematous right great toe. He had an x-ray that was negative there was no culture done. He was given clindamycin. About a week later he developed an area on the left first toe although this is on the plantar aspect. He was able to show me pictures of the right great toe on cell phone. This was clearly an infection which probably resulted in a significant amount of tissue destruction. Since then he is been seen by Dr. Cannon Kettle of the podiatry. He is had at least 2 debridements of these areas. He has been using Betadine and dry dressings. She is also been putting peroxide on the wound area. Patient was seen in consultation by Dr. Donnetta Hutching of vein and vascular. The patient had noninvasive arterial studies showing noncompressible ABIs on the right with an ABI of 1.34 but triphasic waveforms. On the left again 1.34 with triphasic waveforms. TBI's were not done because the great toes were bandaged bilaterally. The patient saw Dr. Donnetta Hutching in consultation on 6/1. He did not feel that arterial issues were going to be of significance to impaired healing of the wounds on the toes. He has recurrent areas of venous stasis disease with wounds on his legs although he has no open  wounds currently. He had juxta lite stockings at one point but they became too old and he has not found a way to get them replaced. 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 Some mild erythema. He also has an area in the tip of the left first toe Objective Constitutional Patient is hypertensive.. Pulse regular and within target range for patient.Marland Kitchen Respirations regular, non-labored and within target range.. Temperature is normal and within the target range for the patient.Marland Kitchen Appears in no distress. Vitals Time Taken: 1:50 PM, Height: 73 in, Weight: 470 lbs, BMI: 62, Temperature: 98.2 F, Pulse: 92 bpm, Respiratory Rate: 20 breaths/min, Blood Pressure: 150/52 mmHg, Capillary Blood Glucose: 159 mg/dl. General Notes: Wound exam ooRight great toe. Necrotic surface fair amount of tissue damage. I really did not like the way this looked it does not probe to bone however I cannot really see why there is so much necrosis. Your was debrided with a #5 curette. Post debridement culture was done. ooOn the tip of the left great toe a wound area on the plantar surface. Integumentary (Hair, Skin) Wound #4 status is Open. Original cause of wound was Gradually Appeared. The wound is located on the Right T Great. The wound measures 2.4cm length x oe 2.5cm width x 0.1cm depth; 4.712cm^2 area and 0.471cm^3 volume. There is Fat Layer (Subcutaneous Tissue) Exposed exposed. There is no tunneling or undermining noted. There is a medium amount of serosanguineous drainage noted. The wound margin is distinct with the outline attached to the wound base. There is small (1-33%) pink granulation within the wound bed. There is a large (67-100%) amount of necrotic tissue within the wound bed including  Adherent Slough. General Notes: callus to periwound. Wound #5 status is Open. Original cause of wound was Gradually Appeared. The wound is located on the Left T Great. The wound measures 1cm length x oe 0.9cm width x 0.1cm depth; 0.707cm^2 area and 0.071cm^3 volume. There is Fat Layer (Subcutaneous Tissue) Exposed exposed. There is no tunneling or undermining noted. There is a medium amount of serosanguineous drainage noted. The wound margin is distinct with the outline attached to the wound base. There is large (67-100%) pink granulation within the wound bed. There is a small (1-33%) amount of necrotic tissue within the wound bed including Adherent Slough. 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 There was a Excisional Skin/Subcutaneous Tissue Debridement with a total area of 6 sq cm performed by Ricard Dillon., MD. With the following instrument(s): Curette to remove Viable and Non-Viable tissue/material. Material removed includes Eschar and Subcutaneous Tissue and after achieving pain control using Other (benzocaine 20% spray). 1 specimen was taken by a Swab and sent to the lab per facility protocol. A time out was conducted at 14:50, prior to the start of the procedure. A Moderate amount of bleeding was controlled with Silver Nitrate. The procedure was tolerated well with a pain level of 0 throughout and a pain level of 0 following the procedure. Post Debridement Measurements: 2.4cm length x 2.5cm width x  0.1cm depth; 0.471cm^3 volume. Character of Wound/Ulcer Post Debridement is improved. Severity of Tissue Post Debridement is: Fat layer  exposed. Post procedure Diagnosis Wound #4: Same as Pre-Procedure Plan Follow-up Appointments: Return Appointment in 1 week. - Friday Dressing Change Frequency: Change dressing every day. Skin Barriers/Peri-Wound Care: Moisturizing lotion - both leg every night. Wound Cleansing: May shower with protection. - use cast protector when not changing the dressings. May shower and wash wound with soap and water. - with dressing changes. Primary Wound Dressing: Wound #4 Right T Great: oe Calcium Alginate with Silver Wound #5 Left T Great: oe Calcium Alginate with Silver Secondary Dressing: Kerlix/Rolled Gauze Dry Gauze Edema Control: Avoid standing for long periods of time Elevate legs to the level of the heart or above for 30 minutes daily and/or when sitting, a frequency of: - throughout the day. Support Garment 30-40 mm/Hg pressure to: - juxtalite HD for bilateral lower legs. Wound center to order for both legs. Off-Loading: Open toe surgical shoe to: - patient to wear all the time while walking both leg. apply felt to surgical shoes to offload pressure to toes. Laboratory ordered were: Anerobic culture - Wound culture of right great toe The following medication(s) was prescribed: doxycycline monohydrate oral 100 mg capsule 1 capsule oral bid for 7 days 1. I am going to use silver alginate to both wound areas 2. He told me that he could not use the surgical shoes we gave him because of his balance. He is now back in his own sandals which do not look too much different from surgical shoes nevertheless he says he walks in these better. I did not debate the issue. 3. Empiric doxycycline 100 twice daily while we await for the wound culture. I am going to have him change this daily so we can keep an eye on the right toe 4. May need to consider additional  imaging studies of this toe he had it x-rayed in the ER once may need to repeat this if this does not respond Electronic Signature(s) Signed: 03/21/2020 5:27:17 PM By: Linton Ham MD Entered By: Linton Ham on 03/21/2020 15:05:09 -------------------------------------------------------------------------------- SuperBill Details Patient Name: Date of Service: [name], PA UL C. 03/21/2020 Medical Record Number: [id number] Patient Account Number: [id number] Date of Birth/Sex: Treating RN: April 27, 1956 (64 y.o. 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

## 2020-03-21 NOTE — Progress Notes (Signed)
AC, COLAN ([id number]) Visit Report for 03/21/2020 Arrival Information Details Patient Name: Date of Service: Joseph [name], [name] 03/21/2020 1:15 PM Medical Record Number: [id number] Patient Account Number: [id number] Date of Birth/Sex: Treating RN: [birth year] (64 y.o. Joseph [name] Primary Care Joseph [name]: Joseph [name] Other Clinician: Referring Joseph [name]: Treating Joseph [name]/Extender: Joseph [name] in Treatment: 1 Visit Information History Since Last Visit Added or deleted any medications: Yes Patient Arrived: Joseph [name] Any new allergies or adverse reactions: No Arrival Time: 13:55 Had a fall or experienced change in No Accompanied By: wife activities of daily living that may affect Transfer Assistance: None risk of falls: Patient Identification Verified: Yes Signs or symptoms of abuse/neglect since last visito No Secondary Verification Process Completed: Yes Hospitalized since last visit: No Patient Requires Transmission-Based Precautions: No Implantable device outside of the clinic excluding No Patient Has Alerts: Yes cellular tissue based products placed in the center Patient Alerts: Patient on Blood Thinner since last visit: Has Dressing in Place as Prescribed: Yes Pain Present Now: Yes Electronic Signature(s) Signed: 03/21/2020 5:05:03 PM By: Deon Pilling Entered By: Deon Pilling on 03/21/2020 14:02:46 -------------------------------------------------------------------------------- Encounter Discharge Information Details Patient Name: Date of Service: Joseph Fitz, PA UL C. 03/21/2020 1:15 PM Medical Record Number: [id number] Patient Account Number: [id number] Date of Birth/Sex: Treating RN: Jan 30, 1956 (64 y.o. Joseph [name] Primary Care Joseph [name]: Joseph [name] Other Clinician: Referring Joseph [name]: Treating Joseph [name]/Extender: Joseph [name] in Treatment: 1 Encounter Discharge Information Items Post Procedure Vitals Discharge  Condition: Stable Temperature (F): 98.2 Ambulatory Status: Walker Pulse (bpm): 92 Discharge Destination: Home Respiratory Rate (breaths/min): 20 Transportation: Private Auto Blood Pressure (mmHg): 150/52 Accompanied By: wife Schedule Follow-up Appointment: Yes Clinical Summary of Care: Patient Declined Electronic Signature(s) Signed: 03/21/2020 5:45:26 PM By: Levan Hurst RN, BSN Entered By: Levan Hurst on 03/21/2020 16:58:00 -------------------------------------------------------------------------------- Lower Extremity Assessment Details Patient Name: Date of Service: Joseph Fitz, PA UL C. 03/21/2020 1:15 PM Medical Record Number: [id number] Patient Account Number: [id number] Date of Birth/Sex: Treating RN: [birth year] (64 y.o. Joseph [name] Primary Care Romaine [name]: Joseph [name] Other Clinician: Referring Joseph [name]: Treating Joseph [name]/Extender: Joseph [name] Weeks in Treatment: 1 Edema Assessment Assessed: [name]: Yes] Joseph [name]: Yes] Edema: [Left: No] [Right: No] Calf Left: Right: Point of Measurement: 41 cm From Medial Instep 46.3 cm 43 cm Ankle Left: Right: Point of Measurement: 9 cm From Medial Instep 24 cm 25 cm Vascular Assessment Pulses: Dorsalis Pedis Palpable: [Left:Yes] [Right:Yes] Electronic Signature(s) Signed: 03/21/2020 5:05:03 PM By: Deon Pilling Entered By: Deon Pilling on 03/21/2020 14:03:44 -------------------------------------------------------------------------------- Multi Wound Chart Details Patient Name: Date of Service: Joseph Fitz, PA UL C. 03/21/2020 1:15 PM Medical Record Number: [id number] Patient Account Number: [id number] Date of Birth/Sex: Treating RN: 11-18-1955 (64 y.o. Joseph [name] Primary Care Shadavia Dampier: Joseph [name] Other Clinician: Referring Jasmon Mattice: Treating Joseph [name]/Extender: Joseph [name] Weeks in Treatment: 1 Vital Signs Height(in): 73 Capillary Blood Glucose(mg/dl): 159 Weight(lbs):  470 Pulse(bpm): 19 Body Mass Index(BMI): 33 Blood Pressure(mmHg): 150/52 Temperature(F): 98.2 Respiratory Rate(breaths/min): 20 Photos: [4:No Photos Right T Great oe] [5:No Photos Left T Great oe] [N/A:N/A N/A] Wound Location: [4:Gradually Appeared] [5:Gradually Appeared] [N/A:N/A] Wounding Event: [4:Diabetic Wound/Ulcer of the Lower] [5:Diabetic Wound/Ulcer of the Lower] [N/A:N/A] Primary Etiology: [4:Extremity Cataracts, Sleep Apnea, Arrhythmia,] [5:Extremity Cataracts, Sleep Apnea, Arrhythmia,] [N/A:N/A] Comorbid History: [4:Hypertension, Peripheral Venous Disease, Type II Diabetes, Neuropathy 02/15/2020] [5:Hypertension, Peripheral Venous Disease, Type II Diabetes, Neuropathy 02/19/2020] [N/A:N/A] Date Acquired: [4:1] [5:1] [  N/A:N/A] Weeks of Treatment: [4:Open] [5:Open] [N/A:N/A] Wound Status: [4:2.4x2.5x0.1] [5:1x0.9x0.1] [N/A:N/A] Measurements L x W x D (cm) [4:4.712] [5:0.707] [N/A:N/A] A (cm) : rea [4:0.471] [5:0.071] [N/A:N/A] Volume (cm) : [4:-9.10%] [5:-12.60%] [N/A:N/A] % Reduction in A rea: [4:-9.00%] [5:-12.70%] [N/A:N/A] % Reduction in Volume: [4:Grade 2] [5:Grade 2] [N/A:N/A] Classification: [4:Medium] [5:Medium] [N/A:N/A] Exudate A mount: [4:Serosanguineous] [5:Serosanguineous] [N/A:N/A] Exudate Type: [4:red, brown] [5:red, brown] [N/A:N/A] Exudate Color: [4:Distinct, outline attached] [5:Distinct, outline attached] [N/A:N/A] Wound Margin: [4:Small (1-33%)] [5:Large (67-100%)] [N/A:N/A] Granulation A mount: [4:Pink] [5:Pink] [N/A:N/A] Granulation Quality: [4:Large (67-100%)] [5:Small (1-33%)] [N/A:N/A] Necrotic A mount: [4:Fat Layer (Subcutaneous Tissue)] [5:Fat Layer (Subcutaneous Tissue)] [N/A:N/A] Exposed Structures: [4:Exposed: Yes Fascia: No Tendon: No Muscle: No Joint: No Bone: No None] [5:Exposed: Yes Fascia: No Tendon: No Muscle: No Joint: No Bone: No None] [N/A:N/A] Epithelialization: [4:Debridement - Excisional] [5:N/A]  [N/A:N/A] Debridement: Pre-procedure Verification/Time Out 14:50 [5:N/A] [N/A:N/A] Taken: [4:Other] [5:N/A] [N/A:N/A] Pain Control: [4:Necrotic/Eschar, Subcutaneous] [5:N/A] [N/A:N/A] Tissue Debrided: [4:Skin/Subcutaneous Tissue] [5:N/A] [N/A:N/A] Level: [4:6] [5:N/A] [N/A:N/A] Debridement A (sq cm): [4:rea Curette] [5:N/A] [N/A:N/A] Instrument: [4:Swab] [5:N/A] [N/A:N/A] Specimen: [4:1] [5:N/A] [N/A:N/A] Number of Specimens Taken: [4:Moderate] [5:N/A] [N/A:N/A] Bleeding: [4:Silver Nitrate] [5:N/A] [N/A:N/A] Hemostasis A chieved: [4:0] [5:N/A] [N/A:N/A] Procedural Pain: [4:0] [5:N/A] [N/A:N/A] Post Procedural Pain: [4:Procedure was tolerated well] [5:N/A] [N/A:N/A] Debridement Treatment Response: [4:2.4x2.5x0.1] [5:N/A] [N/A:N/A] Post Debridement Measurements L x W x D (cm) [4:0.471] [5:N/A] [N/A:N/A] Post Debridement Volume: (cm) [4:callus to periwound.] [5:N/A] [N/A:N/A] Assessment Notes: [4:Debridement] [5:N/A] [N/A:N/A] Treatment Notes Electronic Signature(s) Signed: 03/21/2020 5:01:22 PM By: Kela Millin Signed: 03/21/2020 5:27:17 PM By: Linton Ham MD Entered By: Linton Ham on 03/21/2020 15:01:14 -------------------------------------------------------------------------------- Multi-Disciplinary Care Plan Details Patient Name: Date of Service: Joseph Fitz, PA UL C. 03/21/2020 1:15 PM Medical Record Number: [id number] Patient Account Number: [id number] Date of Birth/Sex: Treating RN: 1955/11/04 (64 y.o. Joseph [name] Primary Care Donnae Michels: Joseph [name] Other Clinician: Referring Davonne Baby: Treating Tannia Contino/Extender: Joseph [name] in Treatment: 1 Active Inactive Abuse / Safety / Falls / Self Care Management Nursing Diagnoses: Potential for falls Goals: Patient will remain injury free related to falls Date Initiated: 03/13/2020 Target Resolution Date: 05/02/2020 Goal Status: Active Interventions: Provide education on fall  prevention Notes: Nutrition Nursing Diagnoses: Potential for alteratiion in Nutrition/Potential for imbalanced nutrition Goals: Patient/caregiver agrees to and verbalizes understanding of need to obtain nutritional consultation Date Initiated: 03/13/2020 Target Resolution Date: 04/04/2020 Goal Status: Active Interventions: Provide education on nutrition Treatment Activities: Patient referred to Primary Care Physician for further nutritional evaluation : 03/13/2020 Notes: Orientation to the Wound Care Program Nursing Diagnoses: Knowledge deficit related to the wound healing center program Goals: Patient/caregiver will verbalize understanding of the [facility] Program Date Initiated: 03/13/2020 Target Resolution Date: 04/04/2020 Goal Status: Active Interventions: Provide education on orientation to the wound center Notes: Wound/Skin Impairment Nursing Diagnoses: Knowledge deficit related to ulceration/compromised skin integrity Goals: Patient/caregiver will verbalize understanding of skin care regimen Date Initiated: 03/13/2020 Target Resolution Date: 04/03/2020 Goal Status: Active Interventions: Assess patient/caregiver ability to perform ulcer/skin care regimen upon admission and as needed Assess ulceration(s) every visit Provide education on ulcer and skin care Treatment Activities: Skin care regimen initiated : 03/13/2020 Topical wound management initiated : 03/13/2020 Notes: Electronic Signature(s) Signed: 03/21/2020 5:01:22 PM By: Kela Millin Entered By: Kela Millin on 03/21/2020 13:44:54 -------------------------------------------------------------------------------- Pain Assessment Details Patient Name: Date of Service: Joseph Fitz, PA UL C. 03/21/2020 1:15 PM Medical Record Number: [id number] Patient Account Number: [id number] Date of Birth/Sex: Treating RN: [birth year] (63  y.o. Joseph [name] Primary Care Karl Erway: Other Clinician: Lynne [name] Referring Pegge Cumberledge: Treating Sadie Pickar/Extender: Joseph [name] in Treatment: 1 Active Problems Location of Pain Severity and Description of Pain Patient Has Paino Yes Site Locations Pain Location: Pain in Ulcers Rate the pain. Current Pain Level: 4 Worst Pain Level: 10 Least Pain Level: 0 Tolerable Pain Level: 8 Character of Pain Describe the Pain: Aching, Heavy Pain Management and Medication Current Pain Management: Medication: No Cold Application: No Rest: No Massage: No Activity: No T.E.N.S.: No Heat Application: No Leg drop or elevation: No Is the Current Pain Management Adequate: Adequate How does your wound impact your activities of daily livingo Sleep: No Bathing: No Appetite: No Relationship With Others: No Bladder Continence: No Emotions: No Bowel Continence: No Work: No Toileting: No Drive: No Dressing: No Hobbies: No Electronic Signature(s) Signed: 03/21/2020 5:05:03 PM By: Deon Pilling Entered By: Deon Pilling on 03/21/2020 14:03:34 -------------------------------------------------------------------------------- Patient/Caregiver Education Details Patient Name: Date of Service: Joseph Fitz, PA UL C. 6/25/2021andnbsp1:15 PM Medical Record Number: [id number] Patient Account Number: [id number] Date of Birth/Gender: Treating RN: 1955/11/24 (64 y.o. Joseph [name] Primary Care Physician: Joseph [name] Other Clinician: Referring Physician: Treating Physician/Extender: Joseph [name] in Treatment: 1 Education Assessment Education Provided To: Patient Education Topics Provided Nutrition: Handouts: Elevated Blood Sugars: How Do They Affect Wound Healing Methods: Explain/Verbal Responses: State content correctly Safety: Handouts: Medication Safety Methods: Explain/Verbal Responses: State content correctly Welcome T [facility]: o Handouts: Welcome T [facility] o Methods:  Explain/Verbal Responses: State content correctly Wound/Skin Impairment: Handouts: Caring for Your Ulcer Methods: Explain/Verbal Responses: State content correctly Electronic Signature(s) Signed: 03/21/2020 5:01:22 PM By: Kela Millin Entered By: Kela Millin on 03/21/2020 13:45:23 -------------------------------------------------------------------------------- Wound Assessment Details Patient Name: Date of Service: Joseph Fitz, PA UL C. 03/21/2020 1:15 PM Medical Record Number: [id number] Patient Account Number: [id number] Date of Birth/Sex: Treating RN: [birth year] (64 y.o. Lorette Ang, Tammi Klippel Primary Care Nancey Kreitz: Joseph [name] Other Clinician: Referring Tawonda Legaspi: Treating Michalina Calbert/Extender: Joseph [name] Weeks in Treatment: 1 Wound Status Wound Number: 4 Primary Diabetic Wound/Ulcer of the Lower Extremity Etiology: Wound Location: Right T Great oe Wound Open Wounding Event: Gradually Appeared Status: Date Acquired: 02/15/2020 Comorbid Cataracts, Sleep Apnea, Arrhythmia, Hypertension, Peripheral Weeks Of Treatment: 1 History: Venous Disease, Type II Diabetes, Neuropathy Clustered Wound: No Wound Measurements Length: (cm) 2.4 Width: (cm) 2.5 Depth: (cm) 0.1 Area: (cm) 4.712 Volume: (cm) 0.471 % Reduction in Area: -9.1% % Reduction in Volume: -9% Epithelialization: None Tunneling: No Undermining: No Wound Description Classification: Grade 2 Wound Margin: Distinct, outline attached Exudate Amount: Medium Exudate Type: Serosanguineous Exudate Color: red, brown Foul Odor After Cleansing: No Slough/Fibrino Yes Wound Bed Granulation Amount: Small (1-33%) Exposed Structure Granulation Quality: Pink Fascia Exposed: No Necrotic Amount: Large (67-100%) Fat Layer (Subcutaneous Tissue) Exposed: Yes Necrotic Quality: Adherent Slough Tendon Exposed: No Muscle Exposed: No Joint Exposed: No Bone Exposed: No Assessment Notes callus to  periwound. Treatment Notes Wound #4 (Right Toe Great) 1. Cleanse With Wound Cleanser 3. Primary Dressing Applied Calcium Alginate Ag 4. Secondary Dressing Dry Gauze Roll Gauze 5. Secured With Recruitment consultant) Signed: 03/21/2020 5:05:03 PM By: Deon Pilling Entered By: Deon Pilling on 03/21/2020 14:04:25 -------------------------------------------------------------------------------- Wound Assessment Details Patient Name: Date of Service: Joseph [name], [place] UL C. 03/21/2020 1:15 PM Medical Record Number: [id number] Patient Account Number: [id number] Date of Birth/Sex: Treating RN: [birth year] (64 y.o. M) Rolin Barry,  XHBZJ Primary Care Dawud Mays: Joseph [name] Other Clinician: Referring Terrie Haring: Treating Horace Wishon/Extender: Joseph [name] Weeks in Treatment: 1 Wound Status Wound Number: 5 Primary Diabetic Wound/Ulcer of the Lower Extremity Etiology: Wound Location: Left T Great oe Wound Open Wounding Event: Gradually Appeared Status: Date Acquired: 02/19/2020 Comorbid Cataracts, Sleep Apnea, Arrhythmia, Hypertension, Peripheral Weeks Of Treatment: 1 History: Venous Disease, Type II Diabetes, Neuropathy Clustered Wound: No Wound Measurements Length: (cm) 1 Width: (cm) 0.9 Depth: (cm) 0.1 Area: (cm) 0.707 Volume: (cm) 0.071 % Reduction in Area: -12.6% % Reduction in Volume: -12.7% Epithelialization: None Tunneling: No Undermining: No Wound Description Classification: Grade 2 Wound Margin: Distinct, outline attached Exudate Amount: Medium Exudate Type: Serosanguineous Exudate Color: red, brown Foul Odor After Cleansing: No Slough/Fibrino Yes Wound Bed Granulation Amount: Large (67-100%) Exposed Structure Granulation Quality: Pink Fascia Exposed: No Necrotic Amount: Small (1-33%) Fat Layer (Subcutaneous Tissue) Exposed: Yes Necrotic Quality: Adherent Slough Tendon Exposed: No Muscle Exposed: No Joint Exposed: No Bone Exposed: No Treatment  Notes Wound #5 (Left Toe Great) 1. Cleanse With Wound Cleanser 3. Primary Dressing Applied Calcium Alginate Ag 4. Secondary Dressing Dry Gauze Roll Gauze 5. Secured With Recruitment consultant) Signed: 03/21/2020 5:05:03 PM By: Deon Pilling Entered By: Deon Pilling on 03/21/2020 14:04:50 -------------------------------------------------------------------------------- Vitals Details Patient Name: Date of Service: Joseph Fitz, PA UL C. 03/21/2020 1:15 PM Medical Record Number: [id number] Patient Account Number: [id number] Date of Birth/Sex: Treating RN: Jul 22, 1956 (64 y.o. Joseph [name] Primary Care Arihaan Bellucci: Joseph [name] Other Clinician: Referring Hosea Hanawalt: Treating Tiffany Talarico/Extender: Joseph [name] Weeks in Treatment: 1 Vital Signs Time Taken: 13:50 Temperature (F): 98.2 Height (in): 73 Pulse (bpm): 92 Weight (lbs): 470 Respiratory Rate (breaths/min): 20 Body Mass Index (BMI): 62 Blood Pressure (mmHg): 150/52 Capillary Blood Glucose (mg/dl): 159 Reference Range: 80 - 120 mg / dl Electronic Signature(s) Signed: 03/21/2020 5:05:03 PM By: Deon Pilling Entered By: Deon Pilling on 03/21/2020 14:03:06

## 2020-03-24 ENCOUNTER — Other Ambulatory Visit: Payer: Self-pay

## 2020-03-24 ENCOUNTER — Other Ambulatory Visit (HOSPITAL_COMMUNITY): Payer: Self-pay | Admitting: Internal Medicine

## 2020-03-24 ENCOUNTER — Encounter (HOSPITAL_BASED_OUTPATIENT_CLINIC_OR_DEPARTMENT_OTHER): Payer: PPO | Admitting: Internal Medicine

## 2020-03-24 ENCOUNTER — Other Ambulatory Visit (HOSPITAL_COMMUNITY)
Admission: RE | Admit: 2020-03-24 | Discharge: 2020-03-24 | Disposition: A | Discharge status: Home / Self Care | Payer: PPO | Attending: Internal Medicine | Admitting: Internal Medicine

## 2020-03-24 ENCOUNTER — Ambulatory Visit (HOSPITAL_COMMUNITY)
Admission: RE | Admit: 2020-03-24 | Discharge: 2020-03-24 | Disposition: A | Discharge status: Home / Self Care | Payer: PPO | Source: Ambulatory Visit | Attending: Internal Medicine | Admitting: Internal Medicine

## 2020-03-24 DIAGNOSIS — L97518 Non-pressure chronic ulcer of other part of right foot with other specified severity: Secondary | ICD-10-CM

## 2020-03-24 DIAGNOSIS — S91101A Unspecified open wound of right great toe without damage to nail, initial encounter: Secondary | ICD-10-CM | POA: Diagnosis not present

## 2020-03-24 DIAGNOSIS — I87303 Chronic venous hypertension (idiopathic) without complications of bilateral lower extremity: Secondary | ICD-10-CM | POA: Diagnosis not present

## 2020-03-24 DIAGNOSIS — E11621 Type 2 diabetes mellitus with foot ulcer: Secondary | ICD-10-CM | POA: Insufficient documentation

## 2020-03-24 DIAGNOSIS — L97523 Non-pressure chronic ulcer of other part of left foot with necrosis of muscle: Secondary | ICD-10-CM | POA: Diagnosis not present

## 2020-03-24 DIAGNOSIS — L97529 Non-pressure chronic ulcer of other part of left foot with unspecified severity: Secondary | ICD-10-CM | POA: Diagnosis not present

## 2020-03-24 NOTE — Progress Notes (Signed)
Joseph, [name] ([id number]) Visit Report for 03/24/2020 Arrival Information Details Patient Name: Date of Service: Joseph [name], [name] 03/24/2020 3:15 PM Medical Record Number: [id number] Patient Account Number: [id number] Date of Birth/Sex: Treating RN: 1955/12/28 (64 y.o. Marvis Repress Primary Care Tiah Heckel: Lynne Logan Other Clinician: Referring Kimberly Nieland: Treating Zarya Lasseigne/Extender: Lorenza Evangelist in Treatment: 1 Visit Information History Since Last Visit Added or deleted any medications: No Patient Arrived: Gilford Rile Any new allergies or adverse reactions: No Arrival Time: 15:20 Had a fall or experienced change in No Accompanied By: wife activities of daily living that may affect Transfer Assistance: None risk of falls: Patient Identification Verified: Yes Signs or symptoms of abuse/neglect since last visito No Secondary Verification Process Completed: Yes Hospitalized since last visit: No Patient Requires Transmission-Based Precautions: No Implantable device outside of the clinic excluding No Patient Has Alerts: Yes cellular tissue based products placed in the center Patient Alerts: Patient on Blood Thinner since last visit: Has Dressing in Place as Prescribed: Yes Pain Present Now: No Electronic Signature(s) Signed: 03/24/2020 5:58:35 PM By: Kela Millin Entered By: Kela Millin on 03/24/2020 15:21:02 -------------------------------------------------------------------------------- Clinic Level of Care Assessment Details Patient Name: Date of Service: Joseph [name], [place] UL C. 03/24/2020 3:15 PM Medical Record Number: [id number] Patient Account Number: [id number] Date of Birth/Sex: Treating RN: 14-Oct-1955 (64 y.o. [name] Primary Care Tekeshia Klahr: Lynne Logan Other Clinician: Referring [name] Guillen: Treating Alondra Vandeven/Extender: Lorenza Evangelist in Treatment: 1 Clinic Level of Care Assessment Items TOOL 4 Quantity Score X-  1 0 Use when only an EandM is performed on FOLLOW-UP visit ASSESSMENTS - Nursing Assessment / Reassessment X- 1 10 Reassessment of Co-morbidities (includes updates in patient status) X- 1 5 Reassessment of Adherence to Treatment Plan ASSESSMENTS - Wound and Skin A ssessment / Reassessment []  - 0 Simple Wound Assessment / Reassessment - one wound X- 2 5 Complex Wound Assessment / Reassessment - multiple wounds []  - 0 Dermatologic / Skin Assessment (not related to wound area) ASSESSMENTS - Focused Assessment []  - 0 Circumferential Edema Measurements - multi extremities []  - 0 Nutritional Assessment / Counseling / Intervention X- 1 5 Lower Extremity Assessment (monofilament, tuning fork, pulses) []  - 0 Peripheral Arterial Disease Assessment (using hand held doppler) ASSESSMENTS - Ostomy and/or Continence Assessment and Care []  - 0 Incontinence Assessment and Management []  - 0 Ostomy Care Assessment and Management (repouching, etc.) PROCESS - Coordination of Care X - Simple Patient / Family Education for ongoing care 1 15 []  - 0 Complex (extensive) Patient / Family Education for ongoing care X- 1 10 Staff obtains Programmer, systems, Records, T Results / Process Orders est []  - 0 Staff telephones HHA, Nursing Homes / Clarify orders / etc []  - 0 Routine Transfer to another Facility (non-emergent condition) []  - 0 Routine Hospital Admission (non-emergent condition) []  - 0 New Admissions / Biomedical engineer / Ordering NPWT Apligraf, etc. , []  - 0 Emergency Hospital Admission (emergent condition) X- 1 10 Simple Discharge Coordination []  - 0 Complex (extensive) Discharge Coordination PROCESS - Special Needs []  - 0 Pediatric / Minor Patient Management []  - 0 Isolation Patient Management []  - 0 Hearing / Language / Visual special needs []  - 0 Assessment of Community assistance (transportation, D/C planning, etc.) []  - 0 Additional assistance / Altered mentation []  -  0 Support Surface(s) Assessment (bed, cushion, seat, etc.) INTERVENTIONS - Wound Cleansing / Measurement []  - 0 Simple Wound Cleansing - one wound X-  2 5 Complex Wound Cleansing - multiple wounds X- 1 5 Wound Imaging (photographs - any number of wounds) []  - 0 Wound Tracing (instead of photographs) []  - 0 Simple Wound Measurement - one wound X- 2 5 Complex Wound Measurement - multiple wounds INTERVENTIONS - Wound Dressings X - Small Wound Dressing one or multiple wounds 2 10 []  - 0 Medium Wound Dressing one or multiple wounds []  - 0 Large Wound Dressing one or multiple wounds X- 1 5 Application of Medications - topical []  - 0 Application of Medications - injection INTERVENTIONS - Miscellaneous []  - 0 External ear exam X- 1 5 Specimen Collection (cultures, biopsies, blood, body fluids, etc.) X- 1 5 Specimen(s) / Culture(s) sent or taken to Lab for analysis []  - 0 Patient Transfer (multiple staff / Civil Service fast streamer / Similar devices) []  - 0 Simple Staple / Suture removal (25 or less) []  - 0 Complex Staple / Suture removal (26 or more) []  - 0 Hypo / Hyperglycemic Management (close monitor of Blood Glucose) []  - 0 Ankle / Brachial Index (ABI) - do not check if billed separately X- 1 5 Vital Signs Has the patient been seen at the hospital within the last three years: Yes Total Score: 130 Level Of Care: New/Established - Level 4 Electronic Signature(s) Signed: 03/24/2020 6:01:25 PM By: Levan Hurst RN, BSN Entered By: Levan Hurst on 03/24/2020 17:21:17 -------------------------------------------------------------------------------- Encounter Discharge Information Details Patient Name: Date of Service: Joseph Fitz, [place] UL C. 03/24/2020 3:15 PM Medical Record Number: [id number] Patient Account Number: [id number] Date of Birth/Sex: Treating RN: 28-Jan-1956 (64 y.o. Marvis Repress Primary Care Gunhild Bautch: Lynne Logan Other Clinician: Referring Kiandria Clum: Treating  Lesly Pontarelli/Extender: Lorenza Evangelist in Treatment: 1 Encounter Discharge Information Items Discharge Condition: Stable Ambulatory Status: Walker Discharge Destination: Home Transportation: Private Auto Accompanied By: wife Schedule Follow-up Appointment: Yes Clinical Summary of Care: Patient Declined Electronic Signature(s) Signed: 03/24/2020 5:58:35 PM By: Kela Millin Entered By: Kela Millin on 03/24/2020 16:19:40 -------------------------------------------------------------------------------- Lower Extremity Assessment Details Patient Name: Date of Service: Joseph [name], [place] UL C. 03/24/2020 3:15 PM Medical Record Number: [id number] Patient Account Number: [id number] Date of Birth/Sex: Treating RN: 1956/05/29 (64 y.o. Marvis Repress Primary Care Lindora Alviar: Lynne Logan Other Clinician: Referring Noma Quijas: Treating Lyssa Hackley/Extender: Lawerance Cruel Weeks in Treatment: 1 Edema Assessment Assessed: [Left: No] [Right: No] Edema: [Left: No] [Right: No] Calf Left: Right: Point of Measurement: 41 cm From Medial Instep 46.3 cm 43 cm Ankle Left: Right: Point of Measurement: 9 cm From Medial Instep 24 cm 25 cm Electronic Signature(s) Signed: 03/24/2020 5:58:35 PM By: Kela Millin Entered By: Kela Millin on 03/24/2020 15:22:46 -------------------------------------------------------------------------------- Multi Wound Chart Details Patient Name: Date of Service: Joseph Fitz, [place] UL C. 03/24/2020 3:15 PM Medical Record Number: [id number] Patient Account Number: [id number] Date of Birth/Sex: Treating RN: May 27, 1956 (64 y.o. [name] Primary Care Krisa Blattner: Lynne Logan Other Clinician: Referring Merriam Brandner: Treating Saifan Rayford/Extender: Lawerance Cruel Weeks in Treatment: 1 Vital Signs Height(in): 73 Capillary Blood Glucose(mg/dl): 190 Weight(lbs): 470 Pulse(bpm): 53 Body Mass Index(BMI): 29 Blood  Pressure(mmHg): 145/50 Temperature(F): 98.3 Respiratory Rate(breaths/min): 20 Photos: [4:No Photos Right T Great oe] [5:No Photos Left T Great oe] [N/A:N/A N/A] Wound Location: [4:Gradually Appeared] [5:Gradually Appeared] [N/A:N/A] Wounding Event: [4:Diabetic Wound/Ulcer of the Lower] [5:Diabetic Wound/Ulcer of the Lower] [N/A:N/A] Primary Etiology: [4:Extremity Cataracts, Sleep Apnea, Arrhythmia,] [5:Extremity Cataracts, Sleep Apnea, Arrhythmia,] [N/A:N/A] Comorbid History: [4:Hypertension, Peripheral Venous Disease, Type II Diabetes, Neuropathy 02/15/2020] [  5:Hypertension, Peripheral Venous Disease, Type II Diabetes, Neuropathy 02/19/2020] [N/A:N/A] Date Acquired: [4:1] [5:1] [N/A:N/A] Weeks of Treatment: [4:Open] [5:Open] [N/A:N/A] Wound Status: [4:2.4x2.5x0.1] [5:1.3x1x0.1] [N/A:N/A] Measurements L x W x D (cm) [4:4.712] [5:1.021] [N/A:N/A] A (cm) : rea [4:0.471] [5:0.102] [N/A:N/A] Volume (cm) : [4:-9.10%] [5:-62.60%] [N/A:N/A] % Reduction in A rea: [4:-9.00%] [5:-61.90%] [N/A:N/A] % Reduction in Volume: [4:Grade 2] [5:Grade 2] [N/A:N/A] Classification: [4:Medium] [5:Medium] [N/A:N/A] Exudate A mount: [4:Serous] [5:Purulent] [N/A:N/A] Exudate Type: [4:amber] [5:yellow, brown, green] [N/A:N/A] Exudate Color: [4:No] [5:Yes] [N/A:N/A] Foul Odor A Cleansing: [4:fter N/A] [5:No] [N/A:N/A] Odor A nticipated Due to Product Use: [4:Distinct, outline attached] [5:Distinct, outline attached] [N/A:N/A] Wound Margin: [4:Small (1-33%)] [5:Small (1-33%)] [N/A:N/A] Granulation A mount: [4:Pink] [5:Pink] [N/A:N/A] Granulation Quality: [4:Large (67-100%)] [5:Large (67-100%)] [N/A:N/A] Necrotic A mount: [4:Eschar, Adherent Slough] [5:Adherent Slough] [N/A:N/A] Necrotic Tissue: [4:Fat Layer (Subcutaneous Tissue)] [5:Fat Layer (Subcutaneous Tissue)] [N/A:N/A] Exposed Structures: [4:Exposed: Yes Fascia: No Tendon: No Muscle: No Joint: No Bone: No None] [5:Exposed: Yes Fascia: No Tendon: No Muscle:  No Joint: No Bone: No None] [N/A:N/A] Treatment Notes Wound #4 (Right Toe Great) 1. Cleanse With Wound Cleanser 3. Primary Dressing Applied Calcium Alginate Ag 4. Secondary Dressing Dry Gauze Roll Gauze 5. Secured With Tape Wound #5 (Left Toe Great) 1. Cleanse With Wound Cleanser 3. Primary Dressing Applied Calcium Alginate Ag 4. Secondary Dressing Dry Gauze Roll Gauze 5. Secured With Recruitment consultant) Signed: 03/24/2020 5:46:39 PM By: Linton Ham MD Signed: 03/24/2020 6:01:25 PM By: Levan Hurst RN, BSN Entered By: Linton Ham on 03/24/2020 16:24:49 -------------------------------------------------------------------------------- Multi-Disciplinary Care Plan Details Patient Name: Date of Service: Joseph Fitz, [place] UL C. 03/24/2020 3:15 PM Medical Record Number: [id number] Patient Account Number: [id number] Date of Birth/Sex: Treating RN: August 01, 1956 (64 y.o. [name] Primary Care Thinh Cuccaro: Lynne Logan Other Clinician: Referring Ellyce Lafevers: Treating Brittney Caraway/Extender: Lorenza Evangelist in Treatment: 1 Active Inactive Abuse / Safety / Falls / Self Care Management Nursing Diagnoses: Potential for falls Goals: Patient will remain injury free related to falls Date Initiated: 03/13/2020 Target Resolution Date: 05/02/2020 Goal Status: Active Interventions: Provide education on fall prevention Notes: Nutrition Nursing Diagnoses: Potential for alteratiion in Nutrition/Potential for imbalanced nutrition Goals: Patient/caregiver agrees to and verbalizes understanding of need to obtain nutritional consultation Date Initiated: 03/13/2020 Target Resolution Date: 04/04/2020 Goal Status: Active Interventions: Provide education on nutrition Treatment Activities: Education provided on Nutrition : 03/21/2020 Patient referred to Primary Care Physician for further nutritional evaluation : 03/13/2020 Notes: Orientation to the Wound Care  Program Nursing Diagnoses: Knowledge deficit related to the wound healing center program Goals: Patient/caregiver will verbalize understanding of the [facility] Program Date Initiated: 03/13/2020 Target Resolution Date: 04/04/2020 Goal Status: Active Interventions: Provide education on orientation to the wound center Notes: Wound/Skin Impairment Nursing Diagnoses: Knowledge deficit related to ulceration/compromised skin integrity Goals: Patient/caregiver will verbalize understanding of skin care regimen Date Initiated: 03/13/2020 Target Resolution Date: 04/03/2020 Goal Status: Active Interventions: Assess patient/caregiver ability to perform ulcer/skin care regimen upon admission and as needed Assess ulceration(s) every visit Provide education on ulcer and skin care Treatment Activities: Skin care regimen initiated : 03/13/2020 Topical wound management initiated : 03/13/2020 Notes: Electronic Signature(s) Signed: 03/24/2020 6:01:25 PM By: Levan Hurst RN, BSN Entered By: Levan Hurst on 03/24/2020 15:52:57 -------------------------------------------------------------------------------- Pain Assessment Details Patient Name: Date of Service: Joseph Fitz, [place] UL C. 03/24/2020 3:15 PM Medical Record Number: [id number] Patient Account Number: [id number] Date of Birth/Sex: Treating RN: [birth year] (64 y.o. Marvis Repress Primary Care Ronald Vinsant:  Lynne Logan Other Clinician: Referring Stefen Juba: Treating Ebany Bowermaster/Extender: Lawerance Cruel Weeks in Treatment: 1 Active Problems Location of Pain Severity and Description of Pain Patient Has Paino No Site Locations Pain Management and Medication Current Pain Management: Electronic Signature(s) Signed: 03/24/2020 5:58:35 PM By: Kela Millin Entered By: Kela Millin on 03/24/2020 15:22:28 -------------------------------------------------------------------------------- Patient/Caregiver Education  Details Patient Name: Date of Service: Joseph Fitz, PA UL C. 6/28/2021andnbsp3:15 PM Medical Record Number: [id number] Patient Account Number: [id number] Date of Birth/Gender: Treating RN: December 18, 1955 (64 y.o. [name] Primary Care Physician: Lynne Logan Other Clinician: Referring Physician: Treating Physician/Extender: Lorenza Evangelist in Treatment: 1 Education Assessment Education Provided To: Patient Education Topics Provided Wound/Skin Impairment: Methods: Explain/Verbal Responses: State content correctly Electronic Signature(s) Signed: 03/24/2020 6:01:25 PM By: Levan Hurst RN, BSN Entered By: Levan Hurst on 03/24/2020 15:54:47 -------------------------------------------------------------------------------- Wound Assessment Details Patient Name: Date of Service: Joseph Fitz, PA UL C. 03/24/2020 3:15 PM Medical Record Number: [id number] Patient Account Number: [id number] Date of Birth/Sex: Treating RN: 1956/09/04 (64 y.o. Marvis Repress Primary Care Qais Jowers: Lynne Logan Other Clinician: Referring Caldwell Kronenberger: Treating Syndey Jaskolski/Extender: Lawerance Cruel Weeks in Treatment: 1 Wound Status Wound Number: 4 Primary Diabetic Wound/Ulcer of the Lower Extremity Etiology: Wound Location: Right T Great oe Wound Open Wounding Event: Gradually Appeared Status: Date Acquired: 02/15/2020 Comorbid Cataracts, Sleep Apnea, Arrhythmia, Hypertension, Peripheral Weeks Of Treatment: 1 History: Venous Disease, Type II Diabetes, Neuropathy Clustered Wound: No Wound Measurements Length: (cm) 2.4 Width: (cm) 2.5 Depth: (cm) 0.1 Area: (cm) 4.712 Volume: (cm) 0.471 % Reduction in Area: -9.1% % Reduction in Volume: -9% Epithelialization: None Tunneling: No Undermining: No Wound Description Classification: Grade 2 Wound Margin: Distinct, outline attached Exudate Amount: Medium Exudate Type: Serous Exudate Color: amber Foul Odor After  Cleansing: No Slough/Fibrino Yes Wound Bed Granulation Amount: Small (1-33%) Exposed Structure Granulation Quality: Pink Fascia Exposed: No Necrotic Amount: Large (67-100%) Fat Layer (Subcutaneous Tissue) Exposed: Yes Necrotic Quality: Eschar, Adherent Slough Tendon Exposed: No Muscle Exposed: No Joint Exposed: No Bone Exposed: No Treatment Notes Wound #4 (Right Toe Great) 1. Cleanse With Wound Cleanser 3. Primary Dressing Applied Calcium Alginate Ag 4. Secondary Dressing Dry Gauze Roll Gauze 5. Secured With Recruitment consultant) Signed: 03/24/2020 5:58:35 PM By: Kela Millin Entered By: Kela Millin on 03/24/2020 15:26:36 -------------------------------------------------------------------------------- Wound Assessment Details Patient Name: Date of Service: Joseph Fitz, PA UL C. 03/24/2020 3:15 PM Medical Record Number: [id number] Patient Account Number: [id number] Date of Birth/Sex: Treating RN: [birth year] (64 y.o. Marvis Repress Primary Care Ritesh Opara: Lynne Logan Other Clinician: Referring Tynisa Vohs: Treating Deitrich Steve/Extender: Lawerance Cruel Weeks in Treatment: 1 Wound Status Wound Number: 5 Primary Diabetic Wound/Ulcer of the Lower Extremity Etiology: Wound Location: Left T Great oe Wound Open Wounding Event: Gradually Appeared Status: Status: Date Acquired: 02/19/2020 Comorbid Cataracts, Sleep Apnea, Arrhythmia, Hypertension, Peripheral Weeks Of Treatment: 1 History: Venous Disease, Type II Diabetes, Neuropathy Clustered Wound: No Wound Measurements Length: (cm) 1.3 Width: (cm) 1 Depth: (cm) 0.1 Area: (cm) 1.021 Volume: (cm) 0.102 % Reduction in Area: -62.6% % Reduction in Volume: -61.9% Epithelialization: None Tunneling: No Undermining: No Wound Description Classification: Grade 2 Wound Margin: Distinct, outline attached Exudate Amount: Medium Exudate Type: Purulent Exudate Color: yellow, brown, green Foul  Odor After Cleansing: Yes Due to Product Use: No Slough/Fibrino Yes Wound Bed Granulation Amount: Small (1-33%) Exposed Structure Granulation Quality: Pink Fascia Exposed: No Necrotic Amount: Large (67-100%) Fat Layer (Subcutaneous Tissue)  Exposed: Yes Necrotic Quality: Adherent Slough Tendon Exposed: No Muscle Exposed: No Joint Exposed: No Bone Exposed: No Treatment Notes Wound #5 (Left Toe Great) 1. Cleanse With Wound Cleanser 3. Primary Dressing Applied Calcium Alginate Ag 4. Secondary Dressing Dry Gauze Roll Gauze 5. Secured With Recruitment consultant) Signed: 03/24/2020 5:58:35 PM By: Kela Millin Entered By: Kela Millin on 03/24/2020 15:26:57 -------------------------------------------------------------------------------- Vitals Details Patient Name: Date of Service: Joseph Fitz, PA UL C. 03/24/2020 3:15 PM Medical Record Number: [id number] Patient Account Number: [id number] Date of Birth/Sex: Treating RN: October 24, 1955 (64 y.o. Marvis Repress Primary Care Sadarius Norman: Lynne Logan Other Clinician: Referring Nayquan Evinger: Treating Tarae Wooden/Extender: Lawerance Cruel Weeks in Treatment: 1 Vital Signs Time Taken: 15:20 Temperature (F): 98.3 Height (in): 73 Pulse (bpm): 94 Weight (lbs): 470 Respiratory Rate (breaths/min): 20 Body Mass Index (BMI): 62 Blood Pressure (mmHg): 145/50 Capillary Blood Glucose (mg/dl): 190 Reference Range: 80 - 120 mg / dl Notes patient stated last CBG was 190 Electronic Signature(s) Signed: 03/24/2020 5:58:35 PM By: Kela Millin Entered By: Kela Millin on 03/24/2020 15:22:23

## 2020-03-24 NOTE — Progress Notes (Signed)
SUFIAN, RAVI ([id number]) Visit Report for 03/24/2020 HPI Details Patient Name: Date of Service: [name], [name] 03/24/2020 3:15 PM Medical Record Number: [id number] Patient Account Number: [id number] Date of Birth/Sex: Treating RN: Jul 22, 1956 (64 y.o. [name] Primary Care Provider: Lynne Logan Other Clinician: Referring Provider: Treating Provider/Extender: Lawerance Cruel Weeks in Treatment: 1 History of Present Illness HPI Description: ADMISSION 03/13/20 This is a 64 year old man with type 2 diabetes and peripheral neuropathy. He was previously in this wound care clinic in 2014 and 2015 I think for lower extremity wounds related to chronic venous insufficiency. His current problem started in May. He noticed a wound and erythema on the right great toe which was small but then expanded. He thinks this may have initially been caused by trauma from new shoes. He was in the emergency department on May 21 with painful erythematous right great toe. He had an x-ray that was negative there was no culture done. He was given clindamycin. About a week later he developed an area on the left first toe although this is on the plantar aspect. He was able to show me pictures of the right great toe on cell phone. This was clearly an infection which probably resulted in a significant amount of tissue destruction. Since then he is been seen by Dr. Cannon Kettle of the podiatry. He is had at least 2 debridements of these areas. He has been using Betadine and dry dressings. She is also been putting peroxide on the wound area. Patient was seen in consultation by Dr. Donnetta Hutching of vein and vascular. The patient had noninvasive arterial studies showing noncompressible ABIs on the right with an ABI of 1.34 but triphasic waveforms. On the left again 1.34 with triphasic waveforms. TBI's were not done because the great toes were bandaged bilaterally. The patient saw Dr. Donnetta Hutching in consultation on 6/1. He did  not feel that arterial issues were going to be of significance to impaired healing of the wounds on the toes. He has recurrent areas of venous stasis disease with wounds on his legs although he has no open wounds currently. He had juxta lite stockings at one point but they became too old and he has not found a way to get them replaced. 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 Some mild erythema. He also has an area in the tip of the left first toe 6/28; I saw this patient again on 6/5. Necrotic surface to the tip of the right great toe which required debridement. I did a culture afterwards however the culture never made it to the lab. He called today to be worked in urgently I thought because of the right great toe however he has had increasing erythema drainage and an odor coming from the left great toe. He has not been systemically unwell. I put him on doxycycline empirically on Friday because of the degree of damage at the tip of the right great toe. He is tolerating this well. He is on Coumadin which is concerning Electronic Signature(s) Signed: 03/24/2020 5:46:39 PM By: Linton Ham MD Entered By: Linton Ham on 03/24/2020 16:28:17 -------------------------------------------------------------------------------- Physical Exam Details Patient Name: Date of Service: [name], PA UL C. 03/24/2020 3:15 PM Medical Record Number: [id number] Patient Account Number: [id number] Date of  Birth/Sex: Treating RN: April 09, 1956 (64 y.o. [name] Primary Care Provider: Lynne Logan Other Clinician: Referring Provider: Treating Provider/Extender: Lawerance Cruel Weeks in Treatment: 1 Constitutional Patient is hypertensive.. Pulse regular and within target  range for patient.Marland Kitchen Respirations regular, non-labored and within target range.. Temperature is normal and within the target range for the patient.Marland Kitchen Appears in no distress. Notes Wound exam Right great toe the necrotic surface that I debrided last week is back with a vengeance. I did not debride this again today. The area of concern was the tip of the left great toe. The wound is larger extending medially there is erythema towards the inner phalangeal joint dorsally. Clear drainage coming from this area that I cultured. No overt involvement of the inter phalangeal joint. He has neuropathy. Electronic Signature(s) Signed: 03/24/2020 5:46:39 PM By: Linton Ham MD Entered By: Linton Ham on 03/24/2020 16:29:53 -------------------------------------------------------------------------------- Physician Orders Details Patient Name: Date of Service: [name], PA UL C. 03/24/2020 3:15 PM Medical Record Number: [id number] Patient Account Number: [id number] Date of Birth/Sex: Treating RN: 06-13-1956 (64 y.o. 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 Wound #5 Left T Great oe Change dressing every day. Skin Barriers/Peri-Wound Care Moisturizing lotion - both leg every night. Wound Cleansing May shower with  protection. - use cast protector when not changing the dressings. May shower and wash wound with soap and water. - with dressing changes. Primary Wound Dressing Wound #4 Right T Great oe Calcium Alginate with Silver Wound #5 Left T Great oe Calcium Alginate with Silver Secondary Dressing Wound #4 Right T Great oe Kerlix/Rolled Gauze Dry Gauze Wound #5 Left T Great oe Kerlix/Rolled Gauze Dry Gauze Edema Control Avoid standing for long periods of time Elevate legs to the level of the heart or above for 30 minutes daily and/or when sitting, a frequency of: - throughout the day. Support Garment 30-40 mm/Hg pressure to: - juxtalite HD for bilateral lower legs. Wound center to order for both legs. Off-Loading Open toe surgical shoe to: - patient to wear all the time while walking both leg. apply felt to surgical shoes to offload pressure to toes. Laboratory naerobe culture (MICRO) - left great toe - (ICD10 E11.621 - Type 2 diabetes mellitus with foot Bacteria identified in Unspecified specimen by A ulcer) LOINC Code: 970-2 Convenience Name: Anerobic culture Radiology X-ray, left great toe - Non healing wound - (ICD10 L97.523 - Non-pressure chronic ulcer of other part of left foot with necrosis of muscle) X-ray, right great toe - Non healing wound - (ICD10 L97.518 - Non-pressure chronic ulcer of other part of right foot with other specified severity) Patient Medications llergies: adhesive A Notifications Medication Indication Start End left foot infection 03/24/2020 Augmentin DOSE oral 875 mg-125 mg tablet - 1 tablet oral bid for 5 days (in addition to doxycyline) Electronic Signature(s) Signed: 03/24/2020 3:58:44 PM By: Linton Ham MD Entered By: Linton Ham  on 03/24/2020 15:58:43 Prescription 03/24/2020 -------------------------------------------------------------------------------- Louie Boston MD Patient Name: Provider: 1956/06/19 [id number] Date  of Birth: NPI#: [id number] Sex: DEA #: [phone] [id number] Phone #: License #: [place] Patient Address: [facility] [address] [place], [place] [address] [place], [place] [address] [phone] Allergies Name Reaction Severity adhesive Provider's Orders X-ray, left great toe - ICD10: L97.523 - Non healing wound Hand Signature: Date(s): Prescription 03/24/2020 Louie Boston MD Patient Name: Provider: 08-11-1956 [id number] Date of Birth: NPI#Jerilynn Mages [id number] Sex: DEA #: [phone] [id number] Phone #: License #: [place] Patient Address: [facility] [address] [place], [place] [address] [place], [place] [address] [phone] Allergies Name Reaction Severity adhesive Provider's Orders X-ray, right great toe - ICD10: L97.518 - Non healing wound Hand Signature: Date(s): Electronic Signature(s) Signed: 03/24/2020 5:46:39 PM By: Linton Ham MD Entered By: Linton Ham on 03/24/2020 15:58:44 -------------------------------------------------------------------------------- Problem List Details Patient Name: Date of Service: [name], PA UL C. 03/24/2020 3:15 PM Medical Record Number: [id number] Patient Account Number: [id number] Date of Birth/Sex: Treating RN: 09-21-1956 (64 y.o. [name] Primary Care Provider: Lynne Logan Other Clinician: Referring Provider: Treating Provider/Extender: Lawerance Cruel Weeks in Treatment: 1 Active Problems ICD-10 Encounter Code Description Active Date MDM Diagnosis E11.621 Type 2 diabetes mellitus with foot ulcer 03/13/2020 No Yes L97.518 Non-pressure chronic ulcer of other part of right foot with other specified 03/13/2020 No Yes severity L97.523 Non-pressure chronic ulcer of other part of left foot with necrosis of muscle 03/13/2020 No Yes I87.303 Chronic venous hypertension  (idiopathic) without complications of bilateral 03/13/2020 No Yes lower extremity Inactive Problems Resolved Problems Electronic Signature(s) Signed: 03/24/2020 5:46:39 PM By: Linton Ham MD Entered By: Linton Ham on 03/24/2020 16:24:36 -------------------------------------------------------------------------------- Progress Note Details Patient Name: Date of Service: [name], PA UL C. 03/24/2020 3:15 PM Medical Record Number: [id number] Patient Account Number: [id number] Date of Birth/Sex: Treating RN: [birth year] (64 y.o. [name] Primary Care Provider: Lynne Logan Other Clinician: Referring Provider: Treating Provider/Extender: Lawerance Cruel Weeks in Treatment: 1 Subjective History of Present Illness (HPI) ADMISSION 03/13/20 This is a 64 year old man with type 2 diabetes and peripheral neuropathy. He was previously in this wound care clinic in 2014 and 2015 I think for lower extremity wounds related to chronic venous insufficiency. His current problem started in May. He noticed a wound and erythema on the right great toe which was small but then expanded. He thinks this may have initially been caused by trauma from new shoes. He was in the emergency department on May 21 with painful erythematous right great toe. He had an x-ray that was negative there was no culture done. He was given clindamycin. About a week later he developed an area on the left first toe although this is on the plantar aspect. He was able to show me pictures of the right great toe on cell phone. This was clearly an infection which probably resulted in a significant amount of tissue destruction. Since then he is been seen by Dr. Cannon Kettle of the podiatry. He is had at least 2 debridements of these areas. He has been using Betadine and dry dressings. She is also been putting peroxide on the wound area. Patient was seen in consultation by Dr. Donnetta Hutching of vein and vascular. The patient had  noninvasive arterial studies showing noncompressible  ABIs on the right with an ABI of 1.34 but triphasic waveforms. On the left again 1.34 with triphasic waveforms. TBI's were not done because the great toes were bandaged bilaterally. The patient saw Dr. Donnetta Hutching in consultation on 6/1. He did not feel that arterial issues were going to be of significance to impaired healing of the wounds on the toes. He has recurrent areas of venous stasis disease with wounds on his legs although he has no open wounds currently. He had juxta lite stockings at one point but they became too old and he has not found a way to get them replaced. 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 Some mild erythema. He also has an area in the tip of the left first toe 6/28; I saw this patient again on 6/5. Necrotic surface to the tip of the right great toe which required debridement. I did a culture afterwards however the culture never made it to the lab. He called today to be worked in urgently I thought because of the right great toe however he has had increasing erythema drainage and an odor coming from the left great toe. He has not been systemically unwell. I put him on doxycycline empirically on Friday because of the degree of damage at the tip of the right great toe. He is tolerating this well. He is on Coumadin which is concerning Objective Constitutional Patient is hypertensive.. Pulse regular and within target range for patient.Marland Kitchen Respirations regular, non-labored and within target range.. Temperature is normal and within the target range for the patient.Marland Kitchen Appears in no distress. Vitals Time Taken: 3:20 PM, Height: 73 in, Weight: 470 lbs, BMI: 62, Temperature: 98.3 F, Pulse: 94 bpm, Respiratory  Rate: 20 breaths/min, Blood Pressure: 145/50 mmHg, Capillary Blood Glucose: 190 mg/dl. General Notes: patient stated last CBG was 190 General Notes: Wound exam ooRight great toe the necrotic surface that I debrided last week is back with a vengeance. I did not debride this again today. ooThe area of concern was the tip of the left great toe. The wound is larger extending medially there is erythema towards the inner phalangeal joint dorsally. Clear drainage coming from this area that I cultured. No overt involvement of the inter phalangeal joint. He has neuropathy. Integumentary (Hair, Skin) Wound #4 status is Open. Original cause of wound was Gradually Appeared. The wound is located on the Right T Great. The wound measures 2.4cm length x oe 2.5cm width x 0.1cm depth; 4.712cm^2 area and 0.471cm^3 volume. There is Fat Layer (Subcutaneous Tissue) Exposed exposed. There is no tunneling or undermining noted. There is a medium amount of serous drainage noted. The wound margin is distinct with the outline attached to the wound base. There is small (1-33%) pink granulation within the wound bed. There is a large (67-100%) amount of necrotic tissue within the wound bed including Eschar and Adherent Slough. Wound #5 status is Open. Original cause of wound was Gradually Appeared. The wound is located on the Left T Great. The wound measures 1.3cm length x oe 1cm width x 0.1cm depth; 1.021cm^2 area and 0.102cm^3 volume. There is Fat Layer (Subcutaneous Tissue) Exposed exposed. There is no tunneling or undermining noted. There is a medium amount of purulent drainage noted. Foul odor after cleansing was noted. The wound margin is  distinct with the outline attached to the wound base. There is small (1-33%) pink granulation within the wound bed. There is a large (67-100%) amount of necrotic tissue within the wound bed including Adherent Slough. 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 Wound #5 Left T Great: oe Change dressing every day. Skin Barriers/Peri-Wound Care: Moisturizing lotion - both leg every night. Wound Cleansing: May shower with protection. - use cast protector when not changing the dressings. May shower and wash wound with soap and water. - with dressing changes. Primary Wound Dressing: Wound #4 Right T Great: oe Calcium Alginate with Silver Wound #5 Left T Great: oe Calcium Alginate with Silver Secondary Dressing: Wound #4 Right T Great: oe Kerlix/Rolled Gauze Dry Gauze Wound #5 Left T Great: oe Kerlix/Rolled Gauze Dry Gauze Edema Control: Avoid standing for long periods of time Elevate legs to the level of the heart or above for 30 minutes daily and/or when sitting, a frequency of: - throughout the day. Support Garment 30-40 mm/Hg pressure to: - juxtalite HD for bilateral lower legs. Wound center to order for both legs. Off-Loading: Open toe surgical shoe to: - patient to wear all the time while walking both leg. apply felt to surgical shoes to offload pressure to toes. Laboratory ordered were: Anerobic culture - left great toe Radiology ordered were: X-ray, left great toe - Non healing wound, X-ray, right great toe - Non healing wound The following medication(s) was prescribed: Augmentin oral 875 mg-125 mg tablet 1 tablet oral bid for 5 days (in addition to doxycyline) for left foot infection starting 03/24/2020 1. The left great toe is clearly deteriorated with erythema extending medially around the dorsal aspect  of the toe. This looks like cellulitis. He was already on doxycycline. I have not added Augmentin but I am concerned about the Coumadin. I have asked him to get his PT and INR [facility]. I will see him back on Friday 2. X-rays of both great toes. 3. He has necrotic tissue once again on the tip of the the right great toe. I did not debride this today. 4. Silver alginate on both wound areas 5. Aggressive offloading 6. He is already seen vascular unfortunately TBI's were not done because of bandaging. Nevertheless it was thought that he had enough blood flow to heal both areas Electronic Signature(s) Signed: 03/24/2020 5:46:39 PM By: Linton Ham MD Entered By: Linton Ham on 03/24/2020 16:32:03 -------------------------------------------------------------------------------- SuperBill Details Patient Name: Date of Service: [name], PA UL C. 03/24/2020 Medical Record Number: [id number] Patient Account Number: [id number] Date of Birth/Sex: Treating RN: 12-Feb-1956 (64 y.o. 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 I87.303 Chronic venous hypertension (idiopathic) without complications of bilateral lower extremity Facility Procedures CPT4  Code: [id number] Description: 16010 - WOUND CARE VISIT-LEV 4 EST PT Modifier: Quantity: 1 Physician Procedures : CPT4 Code Description Modifier [id number] - WC PHYS LEVEL 3 - EST PT ICD-10 Diagnosis Description L97.518 Non-pressure chronic ulcer of other part of right foot with other specified  severity L97.523 Non-pressure chronic ulcer of other part of left  foot with necrosis of muscle E11.621 Type 2 diabetes mellitus with foot ulcer Quantity: 1 Electronic Signature(s) Signed: 03/24/2020 5:46:39 PM By: Linton Ham MD Signed: 03/24/2020 6:01:25 PM By: Levan Hurst RN, BSN Entered By: Levan Hurst on 03/24/2020 17:23:18

## 2020-03-27 DIAGNOSIS — Z7901 Long term (current) use of anticoagulants: Secondary | ICD-10-CM | POA: Diagnosis not present

## 2020-03-27 DIAGNOSIS — Z86711 Personal history of pulmonary embolism: Secondary | ICD-10-CM | POA: Diagnosis not present

## 2020-03-28 ENCOUNTER — Encounter (HOSPITAL_BASED_OUTPATIENT_CLINIC_OR_DEPARTMENT_OTHER): Payer: PPO | Attending: Internal Medicine | Admitting: Internal Medicine

## 2020-03-28 DIAGNOSIS — E1142 Type 2 diabetes mellitus with diabetic polyneuropathy: Secondary | ICD-10-CM | POA: Insufficient documentation

## 2020-03-28 DIAGNOSIS — M86672 Other chronic osteomyelitis, left ankle and foot: Secondary | ICD-10-CM | POA: Insufficient documentation

## 2020-03-28 DIAGNOSIS — M86671 Other chronic osteomyelitis, right ankle and foot: Secondary | ICD-10-CM | POA: Insufficient documentation

## 2020-03-28 DIAGNOSIS — I87003 Postthrombotic syndrome without complications of bilateral lower extremity: Secondary | ICD-10-CM | POA: Diagnosis not present

## 2020-03-28 DIAGNOSIS — E11621 Type 2 diabetes mellitus with foot ulcer: Secondary | ICD-10-CM | POA: Insufficient documentation

## 2020-03-28 DIAGNOSIS — L97518 Non-pressure chronic ulcer of other part of right foot with other specified severity: Secondary | ICD-10-CM | POA: Diagnosis not present

## 2020-03-28 DIAGNOSIS — G4733 Obstructive sleep apnea (adult) (pediatric): Secondary | ICD-10-CM | POA: Insufficient documentation

## 2020-03-28 DIAGNOSIS — Z86711 Personal history of pulmonary embolism: Secondary | ICD-10-CM | POA: Insufficient documentation

## 2020-03-28 DIAGNOSIS — L97523 Non-pressure chronic ulcer of other part of left foot with necrosis of muscle: Secondary | ICD-10-CM | POA: Insufficient documentation

## 2020-03-28 DIAGNOSIS — I87303 Chronic venous hypertension (idiopathic) without complications of bilateral lower extremity: Secondary | ICD-10-CM | POA: Diagnosis not present

## 2020-03-28 DIAGNOSIS — Z6841 Body Mass Index (BMI) 40.0 and over, adult: Secondary | ICD-10-CM | POA: Diagnosis not present

## 2020-03-28 NOTE — Progress Notes (Signed)
MAKHAI, FULCO ([id number]) Visit Report for 03/28/2020 HPI Details Patient Name: Date of Service: Joseph [name], [place] UL C. 03/28/2020 1:30 PM Medical Record Number: [id number] Patient Account Number: [id number] Date of Birth/Sex: Treating RN: September 14, 1956 (64 y.o. Marvis Repress Primary Care Provider: Lynne Logan Other Clinician: Referring Provider: Treating Provider/Extender: Lawerance Cruel Weeks in Treatment: 2 History of Present Illness HPI Description: ADMISSION 03/13/20 This is a 64 year old man with type 2 diabetes and peripheral neuropathy. He was previously in this wound care clinic in 2014 and 2015 I think for lower extremity wounds related to chronic venous insufficiency. His current problem started in May. He noticed a wound and erythema on the right great toe which was small but then expanded. He thinks this may have initially been caused by trauma from new shoes. He was in the emergency department on May 21 with painful erythematous right great toe. He had an x-ray that was negative there was no culture done. He was given clindamycin. About a week later he developed an area on the left first toe although this is on the plantar aspect. He was able to show me pictures of the right great toe on cell phone. This was clearly an infection which probably resulted in a significant amount of tissue destruction. Since then he is been seen by Dr. Cannon Kettle of the podiatry. He is had at least 2 debridements of these areas. He has been using Betadine and dry dressings. She is also been putting peroxide on the wound area. Patient was seen in consultation by Dr. Donnetta Hutching of vein and vascular. The patient had noninvasive arterial studies showing noncompressible ABIs on the right with an ABI of 1.34 but triphasic waveforms. On the left again 1.34 with triphasic waveforms. TBI's were not done because the great toes were bandaged bilaterally. The patient saw Dr. Donnetta Hutching in consultation on 6/1. He  did not feel that arterial issues were going to be of significance to impaired healing of the wounds on the toes. He has recurrent areas of venous stasis disease with wounds on his legs although he has no open wounds currently. He had juxta lite stockings at one point but they became too old and he has not found a way to get them replaced. 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 Some mild erythema. He also has an area in the tip of the left first toe 6/28; I saw this patient again on 6/5. Necrotic surface to the tip of the right great toe which required debridement. I did a culture afterwards however the culture never made it to the lab. He called today to be worked in urgently I thought because of the right great toe however he has had increasing erythema drainage and an odor coming from the left great toe. He has not been systemically unwell. I put him on doxycycline empirically on Friday because of the degree of damage at the tip of the right great toe. He is tolerating this well. He is on Coumadin which is concerning 7/2 this is a patient I had in clinic for 2 weeks. He has wounds on the tip of the right great toe that is necrotic and an area on the tip of the plantar aspect of the left great toe. I did x-rays  last time and the results were not good. There is bone destruction in the tips of both toes right greater than left. I have had him on doxycycline as of 10 days ago and more recently Augmentin. Culture of the purulent drainage coming out of the tip of the left great toe showed Proteus and I have extended his Augmentin but because of the amount of destruction in the right great toe I have added doxycycline in preparation for the long weekend. He also has the problem  of being on Coumadin. Apparently his INR was elevated he has been to see his provider apparently 1 dose was held but this is going to be increasingly problematic. Electronic Signature(s) Signed: 03/28/2020 5:03:02 PM By: Linton Ham MD Entered By: Linton Ham on 03/28/2020 16:49:23 -------------------------------------------------------------------------------- Physical Exam Details Patient Name: Date of Service: Joseph Fitz, [place] UL C. 03/28/2020 1:30 PM Medical Record Number: [id number] Patient Account Number: [id number] Date of Birth/Sex: Treating RN: 02/26/1956 (64 y.o. Marvis Repress Primary Care Provider: Lynne Logan Other Clinician: Referring Provider: Treating Provider/Extender: Lawerance Cruel Weeks in Treatment: 2 Constitutional Patient is hypertensive.. Pulse regular and within target range for patient.Marland Kitchen Respirations regular, non-labored and within target range.. Temperature is normal and within the target range for the patient.Marland Kitchen Appears in no distress. Notes Wound exam The right great toe has a necrotic surface absolutely no better. Erythema on the dorsal aspect of the toe is concerning this goes down to almost the base of the toe dorsally. On the left great toe the wound does not look too bad on the plantar tip. There is still the area of tissue damage extending medially into the dorsal aspect of the foot there is less drainage and at least in this area it looks like the Augmentin is having some effect Electronic Signature(s) Signed: 03/28/2020 5:03:02 PM By: Linton Ham MD Entered By: Linton Ham on 03/28/2020 16:50:25 -------------------------------------------------------------------------------- Physician Orders Details Patient Name: Date of Service: Joseph Fitz, PA UL C. 03/28/2020 1:30 PM Medical Record Number: [id number] Patient Account Number: [id number] Date of Birth/Sex: Treating RN: 12/04/1955 (64 y.o. 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 - Friday Return A Other: - pick up antibiotics and take as prescribed Dressing Change Frequency Wound #4 Right T Great oe Change dressing every day. Wound #5 Left T Great oe Change dressing every day. Skin Barriers/Peri-Wound Care Moisturizing lotion - both leg every night. Wound Cleansing May shower with protection. - use cast protector when not changing the dressings. May shower and wash wound with soap and water. - with dressing changes. Primary Wound Dressing Wound #4 Right T Great oe Calcium Alginate with Silver Wound #5 Left T Great oe Calcium Alginate with Silver Secondary Dressing Wound #4 Right T Great oe Kerlix/Rolled Gauze Dry Gauze Wound #5 Left T Great oe Kerlix/Rolled Gauze Dry Gauze Edema Control Avoid standing for long periods of time Elevate legs to the level of the heart or above for 30 minutes daily and/or when sitting, a frequency of: -  throughout the day. Support Garment 30-40 mm/Hg pressure to: - juxtalite HD for bilateral lower legs. Wound center to order for both legs. Off-Loading Open toe surgical shoe to: - patient to wear all the time while walking both leg. apply felt to surgical shoes to offload pressure to toes. Consults Podiatry - [facility], Dr. Cannon Kettle, ASAP. Bilateral Great Toes, X-Rays Shows +Osteomyelitis for both - (ICD10 E11.621 - Type 2  diabetes mellitus with foot ulcer) Patient Medications llergies: adhesive A Notifications Medication Indication Start End foot infection 03/29/2020 Augmentin DOSE oral 875 mg-125 mg tablet - 1 tablet oral bid for a further 8 days (2 weeks total) 03/28/2020 doxycycline monohydrate DOSE oral 100 mg capsule - 1 capsule oral bid for 14 days Electronic Signature(s) Signed: 03/28/2020 3:27:31 PM By: Linton Ham MD Entered By: Linton Ham on 03/28/2020 15:27:29 Prescription 03/28/2020 -------------------------------------------------------------------------------- Louie Boston MD Patient Name: Provider: 17-Mar-1956 [id number] Date of Birth: NPI#[place] Sex: DEA #: [phone] [id number] Phone #: License #: [place] Patient Address: [facility] [address] [place], [place] [address] [place], [place] [address] [phone] Allergies Name Reaction Severity adhesive Provider's Orders Podiatry - ICD10: E11.621 - [facility], Dr. Cannon Kettle, ASAP. Bilateral Great Toes, X-Rays Shows +Osteomyelitis for both Hand Signature: Date(s): Electronic Signature(s) Signed: 03/28/2020 5:03:02 PM By: Linton Ham MD Entered By: Linton Ham on 03/28/2020 15:27:31 -------------------------------------------------------------------------------- Problem List Details Patient Name: Date of Service: Joseph Fitz, PA UL C. 03/28/2020 1:30 PM Medical Record Number: [id number] Patient Account Number: [id number] Date of Birth/Sex: Treating RN: 03-26-1956 (64 y.o. Marvis Repress Primary Care Provider: Lynne Logan Other Clinician: Referring Provider: Treating Provider/Extender: Lawerance Cruel Weeks in Treatment: 2 Active Problems ICD-10 Encounter Code Description Active Date MDM Diagnosis E11.621 Type 2 diabetes mellitus with foot ulcer 03/13/2020 No Yes L97.518 Non-pressure chronic ulcer of other  part of right foot with other specified 03/13/2020 No Yes severity L97.523 Non-pressure chronic ulcer of other part of left foot with necrosis of muscle 03/13/2020 No Yes I87.303 Chronic venous hypertension (idiopathic) without complications of bilateral 03/13/2020 No Yes lower extremity M86.671 Other chronic osteomyelitis, right ankle and foot 03/28/2020 No Yes M86.672 Other chronic osteomyelitis, left ankle and foot 03/28/2020 No Yes Inactive Problems Resolved Problems Electronic Signature(s) Signed: 03/28/2020 5:03:02 PM By: Linton Ham MD Previous Signature: 03/28/2020 4:13:30 PM Version By: Kela Millin Entered By: Linton Ham on 03/28/2020 16:46:57 -------------------------------------------------------------------------------- Progress Note Details Patient Name: Date of Service: Joseph Fitz, PA UL C. 03/28/2020 1:30 PM Medical Record Number: [id number] Patient Account Number: [id number] Date of Birth/Sex: Treating RN: 01/21/1956 (64 y.o. Marvis Repress Primary Care Provider: Lynne Logan Other Clinician: Referring Provider: Treating Provider/Extender: Lawerance Cruel Weeks in Treatment: 2 Subjective History of Present Illness (HPI) ADMISSION 03/13/20 This is a 64 year old man with type 2 diabetes and peripheral neuropathy. He was previously in this wound care clinic in 2014 and 2015 I think for lower extremity wounds related to chronic venous insufficiency. His current problem started in May. He noticed a wound and erythema on the right great toe which was small but then expanded. He thinks this may have initially been caused by trauma from new shoes. He was in the emergency department on May 21 with painful erythematous right great toe. He had an x-ray that was negative there was no culture done. He was given clindamycin. About a week later he  developed an area on the left first toe although this is on the plantar aspect. He was able to show me pictures of the  right great toe on cell phone. This was clearly an infection which probably resulted in a significant amount of tissue destruction. Since then he is been seen by Dr. Cannon Kettle of the podiatry. He is had at least 2 debridements of these areas. He has been using Betadine and dry dressings. She is also been putting peroxide on the wound area. Patient was seen in consultation by Dr. Donnetta Hutching of vein and vascular. The patient had noninvasive arterial studies showing noncompressible ABIs on the right with an ABI of 1.34 but triphasic waveforms. On the left again 1.34 with triphasic waveforms. TBI's were not done because the great toes were bandaged bilaterally. The patient saw Dr. Donnetta Hutching in consultation on 6/1. He did not feel that arterial issues were going to be of significance to impaired healing of the wounds on the toes. He has recurrent areas of venous stasis disease with wounds on his legs although he has no open wounds currently. He had juxta lite stockings at one point but they became too old and he has not found a way to get them replaced. 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 Some mild erythema. He also has an area in the tip of the left first toe 6/28; I saw this patient again on 6/5. Necrotic surface to the tip of the right great toe which required debridement. I did a culture afterwards however the culture never made it to the lab. He called today to be worked in urgently I thought because of the right great toe however he has had increasing erythema drainage and an odor coming from the left great toe. He has not been systemically unwell. I put him on doxycycline empirically on Friday because of the degree of damage at the tip of the right great toe. He is tolerating this  well. He is on Coumadin which is concerning 7/2 this is a patient I had in clinic for 2 weeks. He has wounds on the tip of the right great toe that is necrotic and an area on the tip of the plantar aspect of the left great toe. I did x-rays last time and the results were not good. There is bone destruction in the tips of both toes right greater than left. I have had him on doxycycline as of 10 days ago and more recently Augmentin. Culture of the purulent drainage coming out of the tip of the left great toe showed Proteus and I have extended his Augmentin but because of the amount of destruction in the right great toe I have added doxycycline in preparation for the long weekend. He also has the problem of being on Coumadin. Apparently his INR was elevated he has been to see his provider apparently 1 dose was held but this is going to be increasingly problematic. Objective Constitutional Patient is hypertensive.. Pulse regular and within target range for patient.Marland Kitchen Respirations regular, non-labored and within target range.. Temperature is normal and within the target range for the patient.Marland Kitchen Appears in no distress. Vitals Time Taken: 1:47 PM, Height: 73 in, Weight: 470 lbs, BMI: 62, Temperature: 98.5 F, Pulse: 89 bpm, Respiratory Rate: 20 breaths/min, Blood Pressure: 187/66  mmHg, Capillary Blood Glucose: 77 mg/dl. General Notes: Wound exam ooThe right great toe has a necrotic surface absolutely no better. Erythema on the dorsal aspect of the toe is concerning this goes down to almost the base of the toe dorsally. ooOn the left great toe the wound does not look too bad on the plantar tip. There is still the area of tissue damage extending medially into the dorsal aspect of the foot there is less drainage and at least in this area it looks like the Augmentin is having some effect Integumentary (Hair, Skin) Wound #4 status is Open. Original cause of wound was Gradually Appeared. The wound is located on  the Right T Great. The wound measures 2cm length x oe 2.4cm width x 0.1cm depth; 3.77cm^2 area and 0.377cm^3 volume. There is Fat Layer (Subcutaneous Tissue) Exposed exposed. There is no tunneling or undermining noted. There is a medium amount of serosanguineous drainage noted. The wound margin is distinct with the outline attached to the wound base. There is small (1-33%) pink granulation within the wound bed. There is a large (67-100%) amount of necrotic tissue within the wound bed including Eschar and Adherent Slough. Wound #5 status is Open. Original cause of wound was Gradually Appeared. The wound is located on the Left T Great. The wound measures 1.3cm length x oe 1.4cm width x 0.1cm depth; 1.429cm^2 area and 0.143cm^3 volume. There is Fat Layer (Subcutaneous Tissue) Exposed exposed. There is no tunneling or undermining noted. There is a medium amount of serosanguineous drainage noted. The wound margin is distinct with the outline attached to the wound base. There is medium (34-66%) pink granulation within the wound bed. There is a medium (34-66%) amount of necrotic tissue within the wound bed including Adherent Slough. 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 - Friday Other: - pick up antibiotics and take as prescribed Dressing Change Frequency: Wound #4 Right T Great: oe Change dressing every day. Wound #5 Left T Great: oe Change dressing every day. Skin Barriers/Peri-Wound Care: Moisturizing lotion - both leg every night. Wound  Cleansing: May shower with protection. - use cast protector when not changing the dressings. May shower and wash wound with soap and water. - with dressing changes. Primary Wound Dressing: Wound #4 Right T Great: oe Calcium Alginate with Silver Wound #5 Left T Great: oe Calcium Alginate with Silver Secondary Dressing: Wound #4 Right T Great: oe Kerlix/Rolled Gauze Dry Gauze Wound #5 Left T Great: oe Kerlix/Rolled Gauze Dry Gauze Edema Control: Avoid standing for long periods of time Elevate legs to the level of the heart or above for 30 minutes daily and/or when sitting, a frequency of: - throughout the day. Support Garment 30-40 mm/Hg pressure to: - juxtalite HD for bilateral lower legs. Wound center to order for both legs. Off-Loading: Open toe surgical shoe to: - patient to wear all the time while walking both leg. apply felt to surgical shoes to offload pressure to toes. Consults ordered were: Podiatry - [facility], Dr. Cannon Kettle, ASAP. Bilateral Great Toes, X-Rays Shows +Osteomyelitis for both The following medication(s) was prescribed: Augmentin oral 875 mg-125 mg tablet  1 tablet oral bid for a further 8 days (2 weeks total) for foot infection starting 03/29/2020 doxycycline monohydrate oral 100 mg capsule 1 capsule oral bid for 14 days starting 03/28/2020 1. Things are not going well here. 2. I do not believe that the right great toe is salvageable and I have told the patient this. I have put in a call to Dr. Cannon Kettle his previous podiatrist for her help in trying to arrange a right toe amputation. He has had previous arterial studies and I think his blood flow should be adequate based on this 3. The left great toe looks somewhat better today. I have continued the Augmentin which should cover the Proteus I cultured over this a few days ago added doxycycline in case there is MRSA 4. I told the patient he is going to need to have his INR checked on Tuesday. I was concerned  about adding additional antibiotics in the face of Coumadin however given the erythema on the right great toe I did not know what choice I had. I do not think he is systemically unwell and I currently do not think he warrants admission to the hospital 5 the amount of bone destruction in the tip of the right great toe was impressive and not so marked change from the study he had at the end of May. there is also change in the left Electronic Signature(s) Signed: 03/28/2020 5:03:02 PM By: Linton Ham MD Entered By: Linton Ham on 03/28/2020 16:56:14 -------------------------------------------------------------------------------- SuperBill Details Patient Name: Date of Service: Joseph Fitz, PA UL C. 03/28/2020 Medical Record Number: [id number] Patient Account Number: [id number] Date of Birth/Sex: Treating RN: October 10, 1955 (64 y.o. Marvis Repress Primary Care Provider: Lynne Logan Other Clinician: Referring Provider: Treating Provider/Extender: Lawerance Cruel Weeks in Treatment: 2 Diagnosis Coding ICD-10 Codes Code Description [phone] Type 2 diabetes mellitus with foot ulcer L97.518 Non-pressure chronic ulcer of other part of right foot with other specified severity L97.523 Non-pressure chronic ulcer of other part of left foot with necrosis of muscle I87.303 Chronic venous hypertension (idiopathic) without complications of bilateral lower extremity M86.671 Other chronic osteomyelitis, right ankle and foot M86.672 Other chronic osteomyelitis, left ankle and foot Facility Procedures CPT4 Code: [id number] Description: 22 - WOUND CARE VISIT-LEV 4 EST PT Modifier: 1 Quantity: Physician Procedures : CPT4 Code Description Modifier [id number] - WC PHYS LEVEL 3 - EST PT ICD-10 Diagnosis Description E11.621 Type 2 diabetes mellitus with foot ulcer L97.518 Non-pressure chronic ulcer of other part of right foot with other specified severity L97.523  Non-pressure chronic  ulcer of other part of left foot with necrosis of muscle M86.671 Other chronic osteomyelitis, right ankle and foot Quantity: 1 Electronic Signature(s) Signed: 03/28/2020 5:03:02 PM By: Linton Ham MD Previous Signature: 03/28/2020 4:13:30 PM Version By: Kela Millin Entered By: Linton Ham on 03/28/2020 16:56:55

## 2020-03-29 LAB — AEROBIC CULTURE W GRAM STAIN (SUPERFICIAL SPECIMEN): Gram Stain: NONE SEEN

## 2020-04-01 ENCOUNTER — Encounter: Payer: Self-pay | Admitting: Podiatry

## 2020-04-01 ENCOUNTER — Ambulatory Visit: Payer: PPO | Admitting: Podiatry

## 2020-04-01 ENCOUNTER — Other Ambulatory Visit: Payer: Self-pay

## 2020-04-01 VITALS — Temp 98.2°F

## 2020-04-01 DIAGNOSIS — E11621 Type 2 diabetes mellitus with foot ulcer: Secondary | ICD-10-CM | POA: Diagnosis not present

## 2020-04-01 DIAGNOSIS — L97514 Non-pressure chronic ulcer of other part of right foot with necrosis of bone: Secondary | ICD-10-CM | POA: Diagnosis not present

## 2020-04-01 DIAGNOSIS — I872 Venous insufficiency (chronic) (peripheral): Secondary | ICD-10-CM

## 2020-04-01 DIAGNOSIS — E1142 Type 2 diabetes mellitus with diabetic polyneuropathy: Secondary | ICD-10-CM | POA: Diagnosis not present

## 2020-04-01 DIAGNOSIS — L97522 Non-pressure chronic ulcer of other part of left foot with fat layer exposed: Secondary | ICD-10-CM | POA: Diagnosis not present

## 2020-04-01 DIAGNOSIS — M86171 Other acute osteomyelitis, right ankle and foot: Secondary | ICD-10-CM | POA: Diagnosis not present

## 2020-04-01 NOTE — Progress Notes (Signed)
Subjective:  Patient ID: Joseph [name]., male    DOB: 1955/11/15,  MRN: [id number]  Chief Complaint  Patient presents with  . Wound Check    Bilateral; Hallux; Pt stated "I had a culture done on 03/24/20 on Left big toe (results attached); I want to discuss getting surgery done"; pt Diabetic Type 2; Sugar=220 at 10:56 am; pt stated, "I average 135 every 30 days"; A1C=pt does not know but said it was high-not in chart  . Medication Management    Augmentin 875-125 mg & Doxycyline 100 mg; taking both antibiotics    64 y.o. male presents with the above complaint. History confirmed with patient.  He saw Dr. Cannon Kettle on 03/04/2020 for bilateral hallux wounds.  The right foot started first in April 2021, followed by the left more recently.  He also sees a wound care center here for bilateral venous ulcerations and they referred him for the hallux ulcers and osteomyelitis.  They sent him for new x-rays on 03/24/2020, And also took a wound culture of the left hallux, which showed Proteus mirabilis and Pseudomonas.  He has been on Augmentin and doxycycline since then.  Feels okay, denies fevers, chills, nausea, vomiting.  He is very concerned about his ulcerations and potential for amputation limb loss.  He is here today with his wife.  He ambulates today in sandals.  He also was referred to Dr. Donnetta Hutching from vascular surgery who stated that he had good enough blood flow on arterial side to heal an amputation and ulcers, despite his significant venous disease.  Objective:  Physical Exam: warm, good capillary refill, gangrenous changes at right hallux distal tip, gross loss of protective sensation bilateral LEs and ulceration at left hallux tip.  Significant venous insufficiency with +1 pitting edema and hemosiderosis bilateral LEs.  His right leg is wrapped in a dressing for a venous ulceration. Left Foot: 1 cm full-thickness ulceration at the distal tip of the left hallux with serous drainage, fibrogranular wound  base.  No cellulitis.  Right Foot: Gangrenous patch present right hallux distal tip with fibropurulent drainage, significant erythema to the MTPJ.  No malodor currently.  No ascending cellulitis.    Radiographs: Reviewed bilateral foot x-rays from 03/24/2020.  There is gross destruction of the distal phalanx of the right hallux, this is worsened since his last right foot x-rays in May 2021.  Possible erosions of the distal tuft of the left hallux distal phalanx when compared to previous films from 2014.  Wound culture [facility]: Susceptibility   Proteus mirabilis    MIC    AMPICILLIN >=32 RESIST... Resistant    AMPICILLIN/SULBACTAM 8 SENSITIVE  Sensitive    CEFAZOLIN 8 SENSITIVE  Sensitive    CEFEPIME <=0.12 SENS... Sensitive    CEFTAZIDIME <=1 SENSITIVE  Sensitive    CEFTRIAXONE <=0.25 SENS... Sensitive    CIPROFLOXACIN <=0.25 SENS... Sensitive    GENTAMICIN <=1 SENSITIVE  Sensitive    IMIPENEM 4 SENSITIVE  Sensitive    PIP/TAZO <=4 SENSITIVE  Sensitive    TRIMETH/SULFA <=20 SENSIT... Sensitive         Susceptibility   Pseudomonas aeruginosa    MIC    CEFEPIME 2 SENSITIVE  Sensitive    CEFTAZIDIME <=1 SENSITIVE  Sensitive    CIPROFLOXACIN <=0.25 SENS... Sensitive    GENTAMICIN <=1 SENSITIVE  Sensitive    IMIPENEM 1 SENSITIVE  Sensitive    PIP/TAZO <=4 SENSITIVE  Sensitive     Assessment:   1. Acute osteomyelitis of  phalanx of right foot ([place])   2. Diabetic polyneuropathy associated with type 2 diabetes mellitus ([place])   3. Chronic venous stasis dermatitis of both lower extremities   4. Diabetic ulcer of toe of left foot associated with type 2 diabetes mellitus, with fat layer exposed ([place])   5. Ulcer of toe of right foot, with necrosis of bone ([place])      Plan:  Patient was evaluated and treated and all questions answered.  Joseph [name] has a worsening ulceration with bone infection of the bilateral hallux.  The right is worse than the left.  We discussed the  etiology, history, and treatment options for soft tissue and bone infections and diabetic foot ulcerations.  I do not think an MRI would be necessary on the right side as he is gross destruction of the distal phalanx of the hallux, the left side is less clear on x-ray and we could get an MRI but I do not think this is necessary as well.  We discussed biopsying the left hallux versus proceeding with partial amputation of this as well.  He and his wife indicated that they would rather be done with this and healed up so that he has no more infection.  Discussed the risk, benefits and complications, as well as the expected postoperative outcomes and course of partial hallux amputation bilaterally.  I do think given his complex medical history, anticoagulation, and potential need for antibiotics after surgery, we should do this in the hospital with an admission planning for staged surgery, with an initial debridement and open amputation, allowing [place] to drain, and then returning for delayed primary closure at a later date.  In the interim they will continue with her current local wound care plan, and continue on his oral antibiotics.  We discussed worsening signs and symptoms of infection and sepsis and issue to the emergency room immediately if these occur.  I discussed this case in detail with my partner who is on-call Dr. Posey Pronto and he is aware of his history and surgical needs.  Joseph [name] will present to the [facility] emergency room tomorrow for admission, with planned surgery on Thursday, July 8 of this week.  His INR was elevated last week and he stopped his doses for the weekend and resume this on Sunday.  He has not taken his dose today, and I advised him that he should not take this today and we will recheck his INR when he presents to the emergency room tomorrow.  I am hopeful that partial hallux amputation allow [place] to excise his wounds and close his incisions and he will be able to heal these up.  We discussed  factors in wound healing including nutrition, offloading, glycemic control, vascular disease, and neuropathy.  All questions and concerns were addressed.  Lanae Crumbly, DPM 04/01/2020      No follow-ups on file.

## 2020-04-02 ENCOUNTER — Inpatient Hospital Stay (HOSPITAL_COMMUNITY)
Admission: EM | Admit: 2020-04-02 | Discharge: 2020-04-05 | DRG: 617 | Disposition: A | Discharge status: Home / Self Care | Payer: PPO | Attending: Internal Medicine | Admitting: Internal Medicine

## 2020-04-02 ENCOUNTER — Encounter (HOSPITAL_COMMUNITY): Payer: Self-pay | Admitting: Emergency Medicine

## 2020-04-02 DIAGNOSIS — Z7901 Long term (current) use of anticoagulants: Secondary | ICD-10-CM

## 2020-04-02 DIAGNOSIS — G4733 Obstructive sleep apnea (adult) (pediatric): Secondary | ICD-10-CM | POA: Diagnosis present

## 2020-04-02 DIAGNOSIS — I878 Other specified disorders of veins: Secondary | ICD-10-CM | POA: Diagnosis present

## 2020-04-02 DIAGNOSIS — E1165 Type 2 diabetes mellitus with hyperglycemia: Secondary | ICD-10-CM | POA: Diagnosis present

## 2020-04-02 DIAGNOSIS — Z9989 Dependence on other enabling machines and devices: Secondary | ICD-10-CM | POA: Diagnosis not present

## 2020-04-02 DIAGNOSIS — K219 Gastro-esophageal reflux disease without esophagitis: Secondary | ICD-10-CM | POA: Diagnosis present

## 2020-04-02 DIAGNOSIS — E7800 Pure hypercholesterolemia, unspecified: Secondary | ICD-10-CM | POA: Diagnosis present

## 2020-04-02 DIAGNOSIS — M86671 Other chronic osteomyelitis, right ankle and foot: Secondary | ICD-10-CM | POA: Diagnosis not present

## 2020-04-02 DIAGNOSIS — D509 Iron deficiency anemia, unspecified: Secondary | ICD-10-CM | POA: Diagnosis present

## 2020-04-02 DIAGNOSIS — N1832 Chronic kidney disease, stage 3b: Secondary | ICD-10-CM | POA: Diagnosis present

## 2020-04-02 DIAGNOSIS — Z86711 Personal history of pulmonary embolism: Secondary | ICD-10-CM | POA: Diagnosis not present

## 2020-04-02 DIAGNOSIS — Z79899 Other long term (current) drug therapy: Secondary | ICD-10-CM

## 2020-04-02 DIAGNOSIS — I129 Hypertensive chronic kidney disease with stage 1 through stage 4 chronic kidney disease, or unspecified chronic kidney disease: Secondary | ICD-10-CM | POA: Diagnosis present

## 2020-04-02 DIAGNOSIS — N183 Chronic kidney disease, stage 3 unspecified: Secondary | ICD-10-CM | POA: Diagnosis not present

## 2020-04-02 DIAGNOSIS — F329 Major depressive disorder, single episode, unspecified: Secondary | ICD-10-CM | POA: Diagnosis present

## 2020-04-02 DIAGNOSIS — Z7984 Long term (current) use of oral hypoglycemic drugs: Secondary | ICD-10-CM | POA: Diagnosis not present

## 2020-04-02 DIAGNOSIS — M869 Osteomyelitis, unspecified: Secondary | ICD-10-CM | POA: Diagnosis present

## 2020-04-02 DIAGNOSIS — Z20822 Contact with and (suspected) exposure to covid-19: Secondary | ICD-10-CM | POA: Diagnosis present

## 2020-04-02 DIAGNOSIS — E11621 Type 2 diabetes mellitus with foot ulcer: Secondary | ICD-10-CM | POA: Diagnosis present

## 2020-04-02 DIAGNOSIS — I10 Essential (primary) hypertension: Secondary | ICD-10-CM | POA: Diagnosis present

## 2020-04-02 DIAGNOSIS — I8312 Varicose veins of left lower extremity with inflammation: Secondary | ICD-10-CM | POA: Diagnosis present

## 2020-04-02 DIAGNOSIS — E1151 Type 2 diabetes mellitus with diabetic peripheral angiopathy without gangrene: Secondary | ICD-10-CM | POA: Diagnosis present

## 2020-04-02 DIAGNOSIS — L97529 Non-pressure chronic ulcer of other part of left foot with unspecified severity: Secondary | ICD-10-CM | POA: Diagnosis present

## 2020-04-02 DIAGNOSIS — B351 Tinea unguium: Secondary | ICD-10-CM | POA: Diagnosis present

## 2020-04-02 DIAGNOSIS — Z86718 Personal history of other venous thrombosis and embolism: Secondary | ICD-10-CM | POA: Diagnosis not present

## 2020-04-02 DIAGNOSIS — Z89412 Acquired absence of left great toe: Secondary | ICD-10-CM | POA: Diagnosis not present

## 2020-04-02 DIAGNOSIS — E1122 Type 2 diabetes mellitus with diabetic chronic kidney disease: Secondary | ICD-10-CM | POA: Diagnosis present

## 2020-04-02 DIAGNOSIS — E1142 Type 2 diabetes mellitus with diabetic polyneuropathy: Secondary | ICD-10-CM | POA: Diagnosis present

## 2020-04-02 DIAGNOSIS — IMO0002 Reserved for concepts with insufficient information to code with codable children: Secondary | ICD-10-CM | POA: Diagnosis present

## 2020-04-02 DIAGNOSIS — I8311 Varicose veins of right lower extremity with inflammation: Secondary | ICD-10-CM | POA: Diagnosis present

## 2020-04-02 DIAGNOSIS — E1149 Type 2 diabetes mellitus with other diabetic neurological complication: Secondary | ICD-10-CM | POA: Diagnosis present

## 2020-04-02 DIAGNOSIS — L97519 Non-pressure chronic ulcer of other part of right foot with unspecified severity: Secondary | ICD-10-CM | POA: Diagnosis present

## 2020-04-02 DIAGNOSIS — M19071 Primary osteoarthritis, right ankle and foot: Secondary | ICD-10-CM | POA: Diagnosis not present

## 2020-04-02 DIAGNOSIS — Z89431 Acquired absence of right foot: Secondary | ICD-10-CM

## 2020-04-02 DIAGNOSIS — S98112A Complete traumatic amputation of left great toe, initial encounter: Secondary | ICD-10-CM | POA: Diagnosis not present

## 2020-04-02 DIAGNOSIS — R296 Repeated falls: Secondary | ICD-10-CM | POA: Diagnosis present

## 2020-04-02 DIAGNOSIS — M7731 Calcaneal spur, right foot: Secondary | ICD-10-CM | POA: Diagnosis not present

## 2020-04-02 DIAGNOSIS — L02611 Cutaneous abscess of right foot: Secondary | ICD-10-CM | POA: Diagnosis not present

## 2020-04-02 DIAGNOSIS — N1831 Chronic kidney disease, stage 3a: Secondary | ICD-10-CM | POA: Diagnosis present

## 2020-04-02 DIAGNOSIS — E1169 Type 2 diabetes mellitus with other specified complication: Secondary | ICD-10-CM | POA: Diagnosis present

## 2020-04-02 DIAGNOSIS — Z794 Long term (current) use of insulin: Secondary | ICD-10-CM | POA: Diagnosis not present

## 2020-04-02 DIAGNOSIS — M86172 Other acute osteomyelitis, left ankle and foot: Secondary | ICD-10-CM | POA: Diagnosis not present

## 2020-04-02 DIAGNOSIS — L02612 Cutaneous abscess of left foot: Secondary | ICD-10-CM | POA: Diagnosis not present

## 2020-04-02 DIAGNOSIS — M86171 Other acute osteomyelitis, right ankle and foot: Secondary | ICD-10-CM | POA: Diagnosis not present

## 2020-04-02 DIAGNOSIS — F411 Generalized anxiety disorder: Secondary | ICD-10-CM | POA: Diagnosis present

## 2020-04-02 DIAGNOSIS — E1152 Type 2 diabetes mellitus with diabetic peripheral angiopathy with gangrene: Secondary | ICD-10-CM | POA: Diagnosis not present

## 2020-04-02 DIAGNOSIS — Z01818 Encounter for other preprocedural examination: Secondary | ICD-10-CM | POA: Diagnosis present

## 2020-04-02 DIAGNOSIS — Z03818 Encounter for observation for suspected exposure to other biological agents ruled out: Secondary | ICD-10-CM | POA: Diagnosis not present

## 2020-04-02 DIAGNOSIS — M86672 Other chronic osteomyelitis, left ankle and foot: Secondary | ICD-10-CM | POA: Diagnosis not present

## 2020-04-02 DIAGNOSIS — S98111A Complete traumatic amputation of right great toe, initial encounter: Secondary | ICD-10-CM | POA: Diagnosis not present

## 2020-04-02 DIAGNOSIS — I7090 Unspecified atherosclerosis: Secondary | ICD-10-CM | POA: Diagnosis not present

## 2020-04-02 DIAGNOSIS — Z888 Allergy status to other drugs, medicaments and biological substances status: Secondary | ICD-10-CM

## 2020-04-02 NOTE — ED Triage Notes (Signed)
Pt to have surgery tomorrow to have right big toe amputated. Reports was told to go to Ed for pre-admission workup like Covid test, EKG, etc.

## 2020-04-03 ENCOUNTER — Emergency Department (HOSPITAL_COMMUNITY): Payer: PPO

## 2020-04-03 ENCOUNTER — Other Ambulatory Visit: Payer: Self-pay

## 2020-04-03 ENCOUNTER — Inpatient Hospital Stay (HOSPITAL_COMMUNITY): Payer: PPO

## 2020-04-03 ENCOUNTER — Ambulatory Visit (HOSPITAL_COMMUNITY): Admission: RE | Admit: 2020-04-03 | Payer: PPO | Source: Home / Self Care | Admitting: Podiatry

## 2020-04-03 ENCOUNTER — Encounter (HOSPITAL_COMMUNITY): Payer: Self-pay | Admitting: Internal Medicine

## 2020-04-03 ENCOUNTER — Inpatient Hospital Stay (HOSPITAL_COMMUNITY): Payer: PPO | Admitting: Certified Registered"

## 2020-04-03 ENCOUNTER — Encounter (HOSPITAL_COMMUNITY): Payer: PPO

## 2020-04-03 ENCOUNTER — Encounter (HOSPITAL_COMMUNITY)
Admission: EM | Disposition: A | Discharge status: Home / Self Care | Payer: Self-pay | Source: Home / Self Care | Attending: Family Medicine

## 2020-04-03 DIAGNOSIS — Z86711 Personal history of pulmonary embolism: Secondary | ICD-10-CM | POA: Diagnosis not present

## 2020-04-03 DIAGNOSIS — Z7901 Long term (current) use of anticoagulants: Secondary | ICD-10-CM | POA: Diagnosis not present

## 2020-04-03 DIAGNOSIS — N1832 Chronic kidney disease, stage 3b: Secondary | ICD-10-CM | POA: Diagnosis present

## 2020-04-03 DIAGNOSIS — N183 Chronic kidney disease, stage 3 unspecified: Secondary | ICD-10-CM | POA: Diagnosis not present

## 2020-04-03 DIAGNOSIS — E1165 Type 2 diabetes mellitus with hyperglycemia: Secondary | ICD-10-CM | POA: Diagnosis present

## 2020-04-03 DIAGNOSIS — M86671 Other chronic osteomyelitis, right ankle and foot: Secondary | ICD-10-CM | POA: Diagnosis not present

## 2020-04-03 DIAGNOSIS — Z86718 Personal history of other venous thrombosis and embolism: Secondary | ICD-10-CM | POA: Diagnosis not present

## 2020-04-03 DIAGNOSIS — N1831 Chronic kidney disease, stage 3a: Secondary | ICD-10-CM | POA: Diagnosis present

## 2020-04-03 DIAGNOSIS — D509 Iron deficiency anemia, unspecified: Secondary | ICD-10-CM | POA: Diagnosis present

## 2020-04-03 DIAGNOSIS — E1151 Type 2 diabetes mellitus with diabetic peripheral angiopathy without gangrene: Secondary | ICD-10-CM | POA: Diagnosis present

## 2020-04-03 DIAGNOSIS — F411 Generalized anxiety disorder: Secondary | ICD-10-CM | POA: Diagnosis present

## 2020-04-03 DIAGNOSIS — I878 Other specified disorders of veins: Secondary | ICD-10-CM | POA: Diagnosis present

## 2020-04-03 DIAGNOSIS — E1142 Type 2 diabetes mellitus with diabetic polyneuropathy: Secondary | ICD-10-CM | POA: Diagnosis present

## 2020-04-03 DIAGNOSIS — E11621 Type 2 diabetes mellitus with foot ulcer: Secondary | ICD-10-CM | POA: Diagnosis present

## 2020-04-03 DIAGNOSIS — E1152 Type 2 diabetes mellitus with diabetic peripheral angiopathy with gangrene: Secondary | ICD-10-CM

## 2020-04-03 DIAGNOSIS — F329 Major depressive disorder, single episode, unspecified: Secondary | ICD-10-CM | POA: Diagnosis present

## 2020-04-03 DIAGNOSIS — E7800 Pure hypercholesterolemia, unspecified: Secondary | ICD-10-CM | POA: Diagnosis present

## 2020-04-03 DIAGNOSIS — G4733 Obstructive sleep apnea (adult) (pediatric): Secondary | ICD-10-CM | POA: Diagnosis present

## 2020-04-03 DIAGNOSIS — R296 Repeated falls: Secondary | ICD-10-CM | POA: Diagnosis present

## 2020-04-03 DIAGNOSIS — M869 Osteomyelitis, unspecified: Secondary | ICD-10-CM | POA: Diagnosis present

## 2020-04-03 DIAGNOSIS — Z01818 Encounter for other preprocedural examination: Secondary | ICD-10-CM | POA: Diagnosis present

## 2020-04-03 DIAGNOSIS — L97519 Non-pressure chronic ulcer of other part of right foot with unspecified severity: Secondary | ICD-10-CM | POA: Diagnosis present

## 2020-04-03 DIAGNOSIS — M86672 Other chronic osteomyelitis, left ankle and foot: Secondary | ICD-10-CM | POA: Diagnosis not present

## 2020-04-03 DIAGNOSIS — I10 Essential (primary) hypertension: Secondary | ICD-10-CM | POA: Diagnosis not present

## 2020-04-03 DIAGNOSIS — E1169 Type 2 diabetes mellitus with other specified complication: Secondary | ICD-10-CM | POA: Diagnosis present

## 2020-04-03 DIAGNOSIS — E1122 Type 2 diabetes mellitus with diabetic chronic kidney disease: Secondary | ICD-10-CM | POA: Diagnosis present

## 2020-04-03 DIAGNOSIS — I129 Hypertensive chronic kidney disease with stage 1 through stage 4 chronic kidney disease, or unspecified chronic kidney disease: Secondary | ICD-10-CM | POA: Diagnosis present

## 2020-04-03 DIAGNOSIS — N179 Acute kidney failure, unspecified: Secondary | ICD-10-CM | POA: Diagnosis present

## 2020-04-03 DIAGNOSIS — Z20822 Contact with and (suspected) exposure to covid-19: Secondary | ICD-10-CM | POA: Diagnosis present

## 2020-04-03 DIAGNOSIS — E1149 Type 2 diabetes mellitus with other diabetic neurological complication: Secondary | ICD-10-CM | POA: Diagnosis present

## 2020-04-03 DIAGNOSIS — K219 Gastro-esophageal reflux disease without esophagitis: Secondary | ICD-10-CM | POA: Diagnosis present

## 2020-04-03 DIAGNOSIS — L97529 Non-pressure chronic ulcer of other part of left foot with unspecified severity: Secondary | ICD-10-CM | POA: Diagnosis present

## 2020-04-03 DIAGNOSIS — Z794 Long term (current) use of insulin: Secondary | ICD-10-CM

## 2020-04-03 HISTORY — PX: AMPUTATION TOE: SHX6595

## 2020-04-03 LAB — CBC WITH DIFFERENTIAL/PLATELET
Abs Immature Granulocytes: 0.05 10*3/uL (ref 0.00–0.07)
Basophils Absolute: 0.1 10*3/uL (ref 0.0–0.1)
Basophils Relative: 1 %
Eosinophils Absolute: 0.4 10*3/uL (ref 0.0–0.5)
Eosinophils Relative: 3 %
HCT: 41.2 % (ref 39.0–52.0)
Hemoglobin: 12.9 g/dL — ABNORMAL LOW (ref 13.0–17.0)
Immature Granulocytes: 0 %
Lymphocytes Relative: 20 %
Lymphs Abs: 2.5 10*3/uL (ref 0.7–4.0)
MCH: 28.6 pg (ref 26.0–34.0)
MCHC: 31.3 g/dL (ref 30.0–36.0)
MCV: 91.4 fL (ref 80.0–100.0)
Monocytes Absolute: 1 10*3/uL (ref 0.1–1.0)
Monocytes Relative: 9 %
Neutro Abs: 8.2 10*3/uL — ABNORMAL HIGH (ref 1.7–7.7)
Neutrophils Relative %: 67 %
Platelets: 305 10*3/uL (ref 150–400)
RBC: 4.51 MIL/uL (ref 4.22–5.81)
RDW: 15.7 % — ABNORMAL HIGH (ref 11.5–15.5)
WBC: 12.2 10*3/uL — ABNORMAL HIGH (ref 4.0–10.5)
nRBC: 0 % (ref 0.0–0.2)

## 2020-04-03 LAB — URINALYSIS, ROUTINE W REFLEX MICROSCOPIC
Bilirubin Urine: NEGATIVE
Glucose, UA: NEGATIVE mg/dL
Hgb urine dipstick: NEGATIVE
Ketones, ur: 5 mg/dL — AB
Leukocytes,Ua: NEGATIVE
Nitrite: NEGATIVE
Protein, ur: NEGATIVE mg/dL
Specific Gravity, Urine: 1.018 (ref 1.005–1.030)
pH: 5 (ref 5.0–8.0)

## 2020-04-03 LAB — COMPREHENSIVE METABOLIC PANEL
ALT: 15 U/L (ref 0–44)
AST: 21 U/L (ref 15–41)
Albumin: 3.2 g/dL — ABNORMAL LOW (ref 3.5–5.0)
Alkaline Phosphatase: 71 U/L (ref 38–126)
Anion gap: 17 — ABNORMAL HIGH (ref 5–15)
BUN: 43 mg/dL — ABNORMAL HIGH (ref 8–23)
CO2: 20 mmol/L — ABNORMAL LOW (ref 22–32)
Calcium: 8.5 mg/dL — ABNORMAL LOW (ref 8.9–10.3)
Chloride: 101 mmol/L (ref 98–111)
Creatinine, Ser: 1.65 mg/dL — ABNORMAL HIGH (ref 0.61–1.24)
GFR calc Af Amer: 50 mL/min — ABNORMAL LOW (ref >60)
GFR calc non Af Amer: 44 mL/min — ABNORMAL LOW (ref >60)
Glucose, Bld: 155 mg/dL — ABNORMAL HIGH (ref 70–99)
Potassium: 4.5 mmol/L (ref 3.5–5.1)
Sodium: 138 mmol/L (ref 135–145)
Total Bilirubin: 0.7 mg/dL (ref 0.3–1.2)
Total Protein: 6.5 g/dL (ref 6.5–8.1)

## 2020-04-03 LAB — GLUCOSE, CAPILLARY
Glucose-Capillary: 225 mg/dL — ABNORMAL HIGH (ref 70–99)
Glucose-Capillary: 252 mg/dL — ABNORMAL HIGH (ref 70–99)
Glucose-Capillary: 249 mg/dL — ABNORMAL HIGH (ref 70–99)
Glucose-Capillary: 278 mg/dL — ABNORMAL HIGH (ref 70–99)
Glucose-Capillary: 174 mg/dL — ABNORMAL HIGH (ref 70–99)
Glucose-Capillary: 154 mg/dL — ABNORMAL HIGH (ref 70–99)
Glucose-Capillary: 222 mg/dL — ABNORMAL HIGH (ref 70–99)
Glucose-Capillary: 312 mg/dL — ABNORMAL HIGH (ref 70–99)
Glucose-Capillary: 300 mg/dL — ABNORMAL HIGH (ref 70–99)

## 2020-04-03 LAB — HEMOGLOBIN A1C
Hgb A1c MFr Bld: 7.5 % — ABNORMAL HIGH (ref 4.8–5.6)
Mean Plasma Glucose: 168.55 mg/dL

## 2020-04-03 LAB — SARS CORONAVIRUS 2 BY RT PCR (HOSPITAL ORDER, PERFORMED IN ~~LOC~~ HOSPITAL LAB): SARS Coronavirus 2: NEGATIVE

## 2020-04-03 LAB — SURGICAL PCR SCREEN
MRSA, PCR: NEGATIVE
Staphylococcus aureus: NEGATIVE

## 2020-04-03 LAB — PROTIME-INR
INR: 1.7 — ABNORMAL HIGH (ref 0.8–1.2)
Prothrombin Time: 19.6 seconds — ABNORMAL HIGH (ref 11.4–15.2)

## 2020-04-03 LAB — HIV ANTIBODY (ROUTINE TESTING W REFLEX): HIV Screen 4th Generation wRfx: NONREACTIVE

## 2020-04-03 LAB — PREALBUMIN: Prealbumin: 16.9 mg/dL — ABNORMAL LOW (ref 18–38)

## 2020-04-03 SURGERY — AMPUTATION, TOE
Anesthesia: Monitor Anesthesia Care | Site: Toe | Laterality: Bilateral

## 2020-04-03 MED ORDER — INSULIN ASPART 100 UNIT/ML ~~LOC~~ SOLN
0.0000 [IU] | Freq: Every day | SUBCUTANEOUS | Status: DC
  Administered 2020-04-03: 4 [IU] via SUBCUTANEOUS
  Administered 2020-04-04: 3 [IU] via SUBCUTANEOUS

## 2020-04-03 MED ORDER — ACETAMINOPHEN 10 MG/ML IV SOLN: 1000.0000 mg | Freq: Once | INTRAVENOUS | Status: DC | PRN

## 2020-04-03 MED ORDER — AMLODIPINE BESYLATE 10 MG PO TABS
10.0000 mg | ORAL_TABLET | Freq: Every evening | ORAL | Status: DC
  Administered 2020-04-03 – 2020-04-04 (×2): 10 mg via ORAL
  Filled 2020-04-03 (×2): qty 1

## 2020-04-03 MED ORDER — ENSURE MAX PROTEIN PO LIQD
11.0000 [oz_av] | Freq: Two times a day (BID) | ORAL | Status: DC
  Administered 2020-04-03 – 2020-04-04 (×3): 11 [oz_av] via ORAL
  Filled 2020-04-03 (×5): qty 330

## 2020-04-03 MED ORDER — INSULIN ASPART 100 UNIT/ML ~~LOC~~ SOLN
0.0000 [IU] | SUBCUTANEOUS | Status: DC
  Administered 2020-04-03: 5 [IU] via SUBCUTANEOUS
  Filled 2020-04-03: qty 0.09

## 2020-04-03 MED ORDER — PROPOFOL 10 MG/ML IV BOLUS
INTRAVENOUS | Status: DC | PRN
  Administered 2020-04-03 (×4): 10 mg via INTRAVENOUS

## 2020-04-03 MED ORDER — LIDOCAINE HCL 2 % IJ SOLN
INTRAMUSCULAR | Status: DC | PRN
  Administered 2020-04-03 (×2): 5 mL

## 2020-04-03 MED ORDER — ACETAMINOPHEN 500 MG PO TABS: 1000.0000 mg | ORAL_TABLET | Freq: Every day | ORAL | Status: DC | PRN

## 2020-04-03 MED ORDER — SODIUM CHLORIDE 0.9 % IR SOLN
Status: DC | PRN
  Administered 2020-04-03: 1000 mL

## 2020-04-03 MED ORDER — FENTANYL CITRATE (PF) 100 MCG/2ML IJ SOLN
25.0000 ug | Freq: Once | INTRAMUSCULAR | Status: AC
  Administered 2020-04-04: 25 ug via INTRAVENOUS
  Filled 2020-04-03: qty 2

## 2020-04-03 MED ORDER — LACTATED RINGERS IV SOLN: INTRAVENOUS | Status: DC

## 2020-04-03 MED ORDER — LISINOPRIL 20 MG PO TABS
40.0000 mg | ORAL_TABLET | Freq: Every day | ORAL | Status: DC
  Administered 2020-04-04: 40 mg via ORAL
  Filled 2020-04-03: qty 2

## 2020-04-03 MED ORDER — TRAMADOL HCL 50 MG PO TABS
50.0000 mg | ORAL_TABLET | Freq: Four times a day (QID) | ORAL | Status: AC | PRN
  Administered 2020-04-04 (×2): 50 mg via ORAL
  Filled 2020-04-03 (×2): qty 1

## 2020-04-03 MED ORDER — FUROSEMIDE 40 MG PO TABS
40.0000 mg | ORAL_TABLET | Freq: Every day | ORAL | Status: DC
  Administered 2020-04-04: 40 mg via ORAL
  Filled 2020-04-03: qty 1

## 2020-04-03 MED ORDER — MUPIROCIN 2 % EX OINT: 1.0000 "application " | TOPICAL_OINTMENT | Freq: Two times a day (BID) | CUTANEOUS | Status: DC

## 2020-04-03 MED ORDER — BUPROPION HCL ER (XL) 150 MG PO TB24
150.0000 mg | ORAL_TABLET | Freq: Every day | ORAL | Status: DC
  Administered 2020-04-04: 150 mg via ORAL
  Filled 2020-04-03: qty 1

## 2020-04-03 MED ORDER — PROPOFOL 500 MG/50ML IV EMUL
INTRAVENOUS | Status: DC | PRN
  Administered 2020-04-03: 40 ug/kg/min via INTRAVENOUS

## 2020-04-03 MED ORDER — PROMETHAZINE HCL 25 MG/ML IJ SOLN: 6.2500 mg | INTRAMUSCULAR | Status: DC | PRN

## 2020-04-03 MED ORDER — INSULIN ASPART 100 UNIT/ML ~~LOC~~ SOLN
0.0000 [IU] | Freq: Three times a day (TID) | SUBCUTANEOUS | Status: DC
  Administered 2020-04-04: 15 [IU] via SUBCUTANEOUS
  Administered 2020-04-04: 5 [IU] via SUBCUTANEOUS
  Administered 2020-04-04: 15 [IU] via SUBCUTANEOUS
  Administered 2020-04-05: 8 [IU] via SUBCUTANEOUS
  Administered 2020-04-05: 5 [IU] via SUBCUTANEOUS

## 2020-04-03 MED ORDER — INSULIN NPH (HUMAN) (ISOPHANE) 100 UNIT/ML ~~LOC~~ SUSP
15.0000 [IU] | Freq: Two times a day (BID) | SUBCUTANEOUS | Status: DC
  Administered 2020-04-03 – 2020-04-05 (×4): 15 [IU] via SUBCUTANEOUS
  Filled 2020-04-03: qty 10

## 2020-04-03 MED ORDER — PANTOPRAZOLE SODIUM 40 MG PO TBEC
80.0000 mg | DELAYED_RELEASE_TABLET | Freq: Every day | ORAL | Status: DC
  Administered 2020-04-04: 80 mg via ORAL
  Filled 2020-04-03: qty 2

## 2020-04-03 MED ORDER — MEPERIDINE HCL 50 MG/ML IJ SOLN: 6.2500 mg | INTRAMUSCULAR | Status: DC | PRN

## 2020-04-03 MED ORDER — ONDANSETRON HCL 4 MG/2ML IJ SOLN
INTRAMUSCULAR | Status: DC | PRN
  Administered 2020-04-03: 4 mg via INTRAVENOUS

## 2020-04-03 MED ORDER — SODIUM CHLORIDE 0.9 % IV SOLN
2.0000 g | Freq: Three times a day (TID) | INTRAVENOUS | Status: DC
  Filled 2020-04-03: qty 2

## 2020-04-03 MED ORDER — SODIUM CHLORIDE 0.9 % IV SOLN
2.0000 g | Freq: Once | INTRAVENOUS | Status: AC
  Administered 2020-04-03: 2 g via INTRAVENOUS
  Filled 2020-04-03 (×2): qty 2

## 2020-04-03 MED ORDER — ADULT MULTIVITAMIN W/MINERALS CH
1.0000 | ORAL_TABLET | Freq: Every day | ORAL | Status: DC
  Administered 2020-04-03 – 2020-04-04 (×2): 1 via ORAL
  Filled 2020-04-03 (×2): qty 1

## 2020-04-03 MED ORDER — PIPERACILLIN-TAZOBACTAM 3.375 G IVPB
3.3750 g | Freq: Three times a day (TID) | INTRAVENOUS | Status: DC
  Administered 2020-04-03 – 2020-04-05 (×7): 3.375 g via INTRAVENOUS
  Filled 2020-04-03 (×8): qty 50

## 2020-04-03 MED ORDER — FLUOXETINE HCL 20 MG PO CAPS
40.0000 mg | ORAL_CAPSULE | Freq: Every evening | ORAL | Status: DC
  Administered 2020-04-03 – 2020-04-04 (×2): 40 mg via ORAL
  Filled 2020-04-03 (×2): qty 2

## 2020-04-03 MED ORDER — INSULIN ASPART 100 UNIT/ML ~~LOC~~ SOLN: 10.0000 [IU] | Freq: Once | SUBCUTANEOUS | Status: DC

## 2020-04-03 MED ORDER — ONDANSETRON HCL 4 MG PO TABS: 4.0000 mg | ORAL_TABLET | Freq: Four times a day (QID) | ORAL | Status: DC | PRN

## 2020-04-03 MED ORDER — PRAVASTATIN SODIUM 20 MG PO TABS
40.0000 mg | ORAL_TABLET | Freq: Every evening | ORAL | Status: DC
  Administered 2020-04-03 – 2020-04-04 (×2): 40 mg via ORAL
  Filled 2020-04-03 (×2): qty 2

## 2020-04-03 MED ORDER — METRONIDAZOLE 500 MG PO TABS
500.0000 mg | ORAL_TABLET | Freq: Three times a day (TID) | ORAL | Status: DC
  Filled 2020-04-03: qty 1

## 2020-04-03 MED ORDER — BUPIVACAINE HCL (PF) 0.25 % IJ SOLN
INTRAMUSCULAR | Status: DC | PRN
  Administered 2020-04-03 (×2): 5 mL

## 2020-04-03 MED ORDER — INSULIN ASPART 100 UNIT/ML ~~LOC~~ SOLN: 10.0000 [IU] | Freq: Once | SUBCUTANEOUS | Status: AC

## 2020-04-03 MED ORDER — ONDANSETRON HCL 4 MG/2ML IJ SOLN: 4.0000 mg | Freq: Four times a day (QID) | INTRAMUSCULAR | Status: DC | PRN

## 2020-04-03 MED ORDER — PROPOFOL 500 MG/50ML IV EMUL: INTRAVENOUS | Status: DC | PRN

## 2020-04-03 MED ORDER — INSULIN ASPART 100 UNIT/ML ~~LOC~~ SOLN
0.0000 [IU] | Freq: Three times a day (TID) | SUBCUTANEOUS | Status: DC
  Administered 2020-04-03: 3 [IU] via SUBCUTANEOUS

## 2020-04-03 MED ORDER — INSULIN ASPART 100 UNIT/ML ~~LOC~~ SOLN
SUBCUTANEOUS | Status: AC
  Administered 2020-04-03: 10 [IU] via INTRAVENOUS
  Filled 2020-04-03: qty 1

## 2020-04-03 MED ORDER — FENTANYL CITRATE (PF) 100 MCG/2ML IJ SOLN
INTRAMUSCULAR | Status: DC | PRN
  Administered 2020-04-03: 25 ug via INTRAVENOUS

## 2020-04-03 MED ORDER — ACETAMINOPHEN 650 MG RE SUPP: 650.0000 mg | Freq: Four times a day (QID) | RECTAL | Status: DC | PRN

## 2020-04-03 MED ORDER — FENTANYL CITRATE (PF) 100 MCG/2ML IJ SOLN: 25.0000 ug | INTRAMUSCULAR | Status: DC | PRN

## 2020-04-03 MED ORDER — ACETAMINOPHEN 325 MG PO TABS
650.0000 mg | ORAL_TABLET | Freq: Four times a day (QID) | ORAL | Status: DC | PRN
  Administered 2020-04-03 – 2020-04-05 (×3): 650 mg via ORAL
  Filled 2020-04-03 (×4): qty 2

## 2020-04-03 MED ORDER — QUINAPRIL HCL 10 MG PO TABS: 40.0000 mg | ORAL_TABLET | Freq: Every day | ORAL | Status: DC

## 2020-04-03 SURGICAL SUPPLY — 32 items
BAG ZIPLOCK 12X15 (MISCELLANEOUS) ×2 IMPLANT
BLADE SURG SZ10 CARB STEEL (BLADE) ×2 IMPLANT
BNDG COHESIVE 4X5 TAN STRL (GAUZE/BANDAGES/DRESSINGS) ×2 IMPLANT
BNDG ELASTIC 6X5.8 VLCR STR LF (GAUZE/BANDAGES/DRESSINGS) ×2 IMPLANT
BNDG GAUZE ELAST 4 BULKY (GAUZE/BANDAGES/DRESSINGS) ×2 IMPLANT
COVER SURGICAL LIGHT HANDLE (MISCELLANEOUS) ×2 IMPLANT
COVER WAND RF STERILE (DRAPES) IMPLANT
DRAPE EXTREMITY BILATERAL (DRAPES) ×1 IMPLANT
DRAPE U-SHAPE 47X51 STRL (DRAPES) ×1 IMPLANT
DRSG EMULSION OIL 3X16 NADH (GAUZE/BANDAGES/DRESSINGS) ×1 IMPLANT
DRSG KUZMA FLUFF (GAUZE/BANDAGES/DRESSINGS) ×4 IMPLANT
DRSG PAD ABDOMINAL 8X10 ST (GAUZE/BANDAGES/DRESSINGS) ×1 IMPLANT
DURAPREP 26ML APPLICATOR (WOUND CARE) ×1 IMPLANT
ELECT REM PT RETURN 15FT ADLT (MISCELLANEOUS) ×2 IMPLANT
GAUZE SPONGE 4X4 12PLY STRL (GAUZE/BANDAGES/DRESSINGS) ×2 IMPLANT
GLOVE BIOGEL PI IND STRL 8.5 (GLOVE) ×1 IMPLANT
GLOVE BIOGEL PI INDICATOR 8.5 (GLOVE) ×1
GLOVE SURG ORTHO 9.0 STRL STRW (GLOVE) ×2 IMPLANT
KIT BASIN (CUSTOM PROCEDURE TRAY) ×2 IMPLANT
KIT TURNOVER KIT A (KITS) ×1 IMPLANT
MANIFOLD NEPTUNE II (INSTRUMENTS) ×2 IMPLANT
NS IRRIG 1000ML POUR BTL (IV SOLUTION) ×2 IMPLANT
PACK ORTHO EXTREMITY (CUSTOM PROCEDURE TRAY) ×2 IMPLANT
PENCIL SMOKE EVACUATOR (MISCELLANEOUS) ×1 IMPLANT
PROTECTOR NERVE ULNAR (MISCELLANEOUS) ×2 IMPLANT
SUCTION FRAZIER HANDLE 12FR (TUBING) ×1
SUCTION TUBE FRAZIER 12FR DISP (TUBING) ×1 IMPLANT
SUT ETHILON 2 0 PSLX (SUTURE) ×2 IMPLANT
SWAB COLLECTION DEVICE MRSA (MISCELLANEOUS) ×1 IMPLANT
SWAB CULTURE ESWAB REG 1ML (MISCELLANEOUS) ×2 IMPLANT
TRAY PREP A LATEX SAFE STRL (SET/KITS/TRAYS/PACK) ×1 IMPLANT
WATER STERILE IRR 1000ML POUR (IV SOLUTION) ×2 IMPLANT

## 2020-04-03 NOTE — Transfer of Care (Signed)
Immediate Anesthesia Transfer of Care Note  Patient: Joseph [name].  Procedure(s) Performed: BILATERAL HALLUX AMPUTATION (Bilateral Toe)  Patient Location: PACU  Anesthesia Type:MAC  Level of Consciousness: awake, alert  and oriented  Airway & Oxygen Therapy: Patient Spontanous Breathing and Patient connected to face mask oxygen  Post-op Assessment: Report given to RN, Post -op Vital signs reviewed and stable and Patient moving all extremities X 4  Post vital signs: Reviewed and stable  Last Vitals:  Vitals Value Taken Time  BP    Temp    Pulse    Resp    SpO2      Last Pain:  Vitals:   04/03/20 0908  TempSrc: Oral  PainSc:          Complications: No complications documented.

## 2020-04-03 NOTE — Anesthesia Postprocedure Evaluation (Signed)
Anesthesia Post Note  Patient: Joseph [name].  Procedure(s) Performed: BILATERAL HALLUX AMPUTATION (Bilateral Toe)     Patient location during evaluation: PACU Anesthesia Type: MAC Level of consciousness: awake and alert Pain management: pain level controlled Vital Signs Assessment: post-procedure vital signs reviewed and stable Respiratory status: spontaneous breathing, nonlabored ventilation, respiratory function stable and patient connected to nasal cannula oxygen Cardiovascular status: stable and blood pressure returned to baseline Postop Assessment: no apparent nausea or vomiting Anesthetic complications: no   No complications documented.  Last Vitals:  Vitals:   04/03/20 1145 04/03/20 1209  BP: (!) 145/41 (!) 141/50  Pulse: 76 78  Resp: 19 19  Temp: 36.7 C 36.5 C  SpO2: 95% 98%    Last Pain:  Vitals:   04/03/20 1145  TempSrc:   PainSc: 0-No pain                 Effie Berkshire

## 2020-04-03 NOTE — ED Provider Notes (Signed)
[facility] DEPT Provider Note   CSN: [id number] Arrival date & time: 04/02/20  1718     History Chief Complaint  Patient presents with  . pre-admission for surgery    Joseph [name]. is a 64 y.o. male with a history of DVT and PE on Coumadin, diabetes mellitus type 2, PVD, HTN, OSA on CPAP who presents to the emergency department with a chief complaint of a presurgical clearance.  The patient reports that he is scheduled for bilateral hallux amputation for worsening ulceration and osteomyelitis of the bilateral hallux, right greater than left.  With Dr. Posey [name] of x-ray of the foot and ankle at 11 AM on 7/8.  He has not taken his home Coumadin since 7/6 and was advised to discontinue this by podiatry pending his upcoming surgery.  He reports that he has been compliant with his home Augmentin and doxycycline.  He denies fever, chills, cough, shortness of breath, abdominal pain, nausea, vomiting, diarrhea, numbness, weakness, bleeding, purulent discharge, or worsening pain to his wounds.  He has no other complaints at this time.  Per Dr. Maxie [name] note on 7/6: "Joseph [name] will present to the [facility] emergency room tomorrow for admission, with planned surgery on Thursday, July 8 of this week.  His INR was elevated last week and he stopped his doses for the weekend and resume this on Sunday.  He has not taken his dose today, and I advised him that he should not take this today and we will recheck his INR when he presents to the emergency room tomorrow."   The history is provided by the patient. No language interpreter was used.       Past Medical History:  Diagnosis Date  . Anxiety state, unspecified   . Depression   . DVT (deep venous thrombosis) ([place]) 2004   Left knee  . Edema   . Esophageal reflux    occ  . Essential hypertension, benign   . Essential hypertension, benign   . Falls frequently   . History of iron deficiency anemia   . Morbid  obesity ([place])   . Myalgia and myositis, unspecified    BACK AND LEGS  . OSA on CPAP   . Peripheral vascular disease, unspecified ([place])   . Personal history of PE (pulmonary embolism) 2004  . Polyneuropathy in diabetes(357.2)   . Poor venous access    PT STATES DIFFICULT TO DRAW BLOOD FROM HIS VEINS  . Pure hypercholesterolemia   . Shortness of breath    DUE TO WEIGHT AND LOSS OF MOBILITY  . Type II or unspecified type diabetes mellitus with neurological manifestations, not stated as uncontrolled(250.60)   . Type II or unspecified type diabetes mellitus without mention of complication, not stated as uncontrolled   . 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 Varicose veins of lower extremities with inflammation   . Wears glasses   . Wound infection after surgery    required hospitalization    Patient Active Problem List   Diagnosis Date Noted  . Osteomyelitis of great toe of right foot ([place]) 04/03/2020  . Osteomyelitis of great toe of left foot ([place]) 04/03/2020  . CKD (chronic kidney disease) stage 3, GFR 30-59 ml/min 04/03/2020  . Monitoring for anticoagulant use 02/19/2020  . Accumulation of fluid in tissues 02/19/2020  . Anxiety state 02/19/2020  . Diabetes mellitus type 2, uncontrolled ([place]) 02/19/2020  . Diabetes mellitus with polyneuropathy ([place])  02/19/2020  . Fibrositis 02/19/2020  . Peripheral blood vessel disorder ([place]) 02/19/2020  . Varicose veins of lower extremities with inflammation 02/19/2020  . Acute renal insufficiency 01/03/2015  . Esophageal reflux 01/02/2015  . Polyneuropathy in diabetes(357.2) 01/02/2015  . Abscess of postoperative wound of abdominal wall 01/02/2015  . Cellulitis and abscess of trunk 12/31/2014  . S/P IVC filter-temporary REMOVED   . Status post panniculectomy Oct 2015 07/17/2014  . Morbid obesity BMI 65 03/22/2014  . Pure hypercholesterolemia 03/22/2014  . Essential hypertension,  benign 03/22/2014  . Diabetes ([place]) 03/22/2014  . Panniculitis 03/22/2014  . OSA (obstructive sleep apnea) 03/22/2014  . Hx pulmonary embolism 03/22/2014  . Diabetic neuropathy ([place]) 02/20/2014    Past Surgical History:  Procedure Laterality Date  . COLONOSCOPY WITH PROPOFOL N/A 07/20/2019   Procedure: COLONOSCOPY WITH PROPOFOL;  Surgeon: Wonda Horner, MD;  Location: [facility] END[facility]COPY;  Service: Endoscopy;  Laterality: N/A;  . NO PAST SURGERIES    . PANNICULECTOMY N/A 07/15/2014   Procedure: PANNICULECTOMY;  Surgeon: Pedro Earls, MD;  Location: [facility] ORS;  Service: General;  Laterality: N/A;  . POLYPECTOMY  07/20/2019   Procedure: POLYPECTOMY;  Surgeon: Wonda Horner, MD;  Location: [facility];  Service: Endoscopy;;       No family history on file.  Social History   Tobacco Use  . Smoking status: Never Smoker  . Smokeless tobacco: Never Used  Vaping Use  . Vaping Use: Never used  Substance Use Topics  . Alcohol use: Not Currently  . Drug use: No    Home Medications Prior to Admission medications   Medication Sig Start Date End Date Taking? Authorizing Provider  acetaminophen (TYLENOL) 500 MG tablet Take 1,000 mg by mouth daily as needed for moderate pain or headache.    Yes [provider]  amLODipine (NORVASC) 10 MG tablet Take 10 mg by mouth every evening.    Yes [provider]  amoxicillin-clavulanate (AUGMENTIN) 875-125 MG tablet Take 1 tablet by mouth 2 (two) times daily. 03/28/20  Yes [provider]  buPROPion (WELLBUTRIN XL) 150 MG 24 hr tablet Take 150 mg by mouth daily. 10/20/15  Yes [provider]  doxycycline (MONODOX) 100 MG capsule Take 100 mg by mouth 2 (two) times daily. 03/28/20  Yes [provider]  FLUoxetine (PROZAC) 40 MG capsule Take 40 mg by mouth every evening.    Yes [provider]  FREESTYLE LITE test strip TEST BLOOD SUGAR FOUR TIMES DAILY 02/11/20  Yes [provider]  furosemide  (LASIX) 40 MG tablet Take 40 mg by mouth daily.    Yes [provider]  indapamide (LOZOL) 2.5 MG tablet Take 2.5 mg by mouth 2 (two) times daily.    Yes [provider]  insulin aspart (NOVOLOG) 100 UNIT/ML injection Inject 5-20 Units into the skin with breakfast, with lunch, and with evening meal. Sliding scale per meals 02/05/20  Yes [provider]  insulin NPH Human (NOVOLIN N) 100 UNIT/ML injection Inject 30 Units into the skin 2 (two) times daily.    Yes [provider]  metFORMIN (GLUCOPHAGE) 1000 MG tablet Take 1,000 mg by mouth 2 (two) times daily with a meal.   Yes [provider]  omeprazole (PRILOSEC) 40 MG capsule Take 40 mg by mouth daily as needed (acid reflux).    Yes [provider]  pioglitazone (ACTOS) 45 MG tablet Take 45 mg by mouth daily.   Yes [provider]  pravastatin (PRAVACHOL)  40 MG tablet Take 40 mg by mouth every evening.    Yes [provider]  Prenatal Vit-Fe Fumarate-FA (PRENATAL FA PO) Take 1 tablet by mouth daily.   Yes [provider]  quinapril (ACCUPRIL) 40 MG tablet Take 40 mg by mouth daily.    Yes [provider]  silver sulfADIAZINE (SILVADENE) 1 % cream Apply 1 application topically daily. 02/26/20  Yes Early, Arvilla Meres, MD  Wheat Dextrin (BENEFIBER) POWD Take 15 mLs by mouth daily as needed (constipation). Mix in coffee and drink   Yes [provider]  warfarin (COUMADIN) 5 MG tablet Take 5-7.5 mg by mouth See admin instructions. Take 5 mg in the evening on Sun, Mon, Wed, and Fri. Take 7.5 mg in the evening on Tue, Thur, and Sat Patient not taking: Reported on 04/02/2020    [provider]    Allergies    Tape  Review of Systems   Review of Systems  Constitutional: Negative for appetite change, chills and fever.  HENT: Negative for congestion and sore throat.   Respiratory: Negative for shortness of breath and wheezing.   Cardiovascular: Negative  for chest pain, palpitations and leg swelling.  Gastrointestinal: Negative for abdominal pain, diarrhea, nausea and vomiting.  Genitourinary: Negative for dysuria.  Musculoskeletal: Negative for back pain, myalgias, neck pain and neck stiffness.  Skin: Positive for wound. Negative for rash.  Allergic/Immunologic: Negative for immunocompromised state.  Neurological: Negative for dizziness, seizures, syncope, weakness, numbness and headaches.  Psychiatric/Behavioral: Negative for confusion.    Physical Exam Updated Vital Signs BP (!) 152/42 (BP Location: Left Arm)   Pulse 76   Temp 98.5 F (36.9 C) (Oral)   Resp 17   Ht 6\' 1"  (1.854 m)   Wt (!) 213.2 kg   SpO2 96%   BMI 62.01 kg/m   Physical Exam Vitals and nursing note reviewed.  Constitutional:      General: He is not in acute distress.    Appearance: He is well-developed. He is obese. He is not ill-appearing, toxic-appearing or diaphoretic.  HENT:     Head: Normocephalic.  Eyes:     Conjunctiva/sclera: Conjunctivae normal.  Cardiovascular:     Rate and Rhythm: Normal rate and regular rhythm.     Pulses: Normal pulses.     Heart sounds: Normal heart sounds. No murmur heard.  No friction rub. No gallop.   Pulmonary:     Effort: Pulmonary effort is normal.     Breath sounds: No stridor. No wheezing, rhonchi or rales.  Chest:     Chest wall: No tenderness.  Abdominal:     General: There is no distension.     Palpations: Abdomen is soft. There is no mass.     Tenderness: There is no abdominal tenderness. There is no right CVA tenderness, left CVA tenderness, guarding or rebound.     Hernia: No hernia is present.  Musculoskeletal:     Cervical back: Neck supple.     Comments: Ulcerations noted to bilateral great toes.  Skin:    General: Skin is warm and dry.  Neurological:     Mental Status: He is alert.  Psychiatric:        Behavior: Behavior normal.     ED Results / Procedures / Treatments   Labs (all labs  ordered are listed, but only abnormal results are displayed) Labs Reviewed  PROTIME-INR - Abnormal; Notable for the following components:      Result Value   Prothrombin  Time 19.6 (*)    INR 1.7 (*)    All other components within normal limits  CBC WITH DIFFERENTIAL/PLATELET - Abnormal; Notable for the following components:   WBC 12.2 (*)    Hemoglobin 12.9 (*)    RDW 15.7 (*)    Neutro Abs 8.2 (*)    All other components within normal limits  COMPREHENSIVE METABOLIC PANEL - Abnormal; Notable for the following components:   CO2 20 (*)    Glucose, Bld 155 (*)    BUN 43 (*)    Creatinine, Ser 1.65 (*)    Calcium 8.5 (*)    Albumin 3.2 (*)    GFR calc non Af Amer 44 (*)    GFR calc Af Amer 50 (*)    Anion gap 17 (*)    All other components within normal limits  URINALYSIS, ROUTINE W REFLEX MICROSCOPIC - Abnormal; Notable for the following components:   Ketones, ur 5 (*)    All other components within normal limits  GLUCOSE, CAPILLARY - Abnormal; Notable for the following components:   Glucose-Capillary 225 (*)    All other components within normal limits  GLUCOSE, CAPILLARY - Abnormal; Notable for the following components:   Glucose-Capillary 252 (*)    All other components within normal limits  SARS CORONAVIRUS 2 BY RT PCR (HOSPITAL ORDER, [place] [facility])  CULTURE, BLOOD (ROUTINE X 2)  CULTURE, BLOOD (ROUTINE X 2)  MRSA PCR SCREENING  HEMOGLOBIN A1C  HIV ANTIBODY (ROUTINE TESTING W REFLEX)  PREALBUMIN    EKG EKG Interpretation  Date/Time:  Thursday April 03 2020 03:43:22 EDT Ventricular Rate:  82 PR Interval:    QRS Duration: 85 QT Interval:  373 QTC Calculation: 436 R Axis:   -27 Text Interpretation: Sinus rhythm Borderline prolonged PR interval Borderline left axis deviation No significant change was found Confirmed by Shanon Rosser [phone]) on 04/03/2020 3:48:16 AM   Radiology DG Chest 2 View  Result Date: 04/03/2020 CLINICAL DATA:  Preop  for surgery EXAM: CHEST - 2 VIEW COMPARISON:  01/03/2015 FINDINGS: Low volume chest similar to prior, with eventration appearance of the right diaphragm. Mild streaky scarring at the left base. Aeration is improved from before. No edema, effusion, or pneumothorax. IMPRESSION: No evidence of active disease. Electronically Signed   By: Monte Fantasia M.D.   On: 04/03/2020 04:07    Procedures Procedures (including critical care time)  Medications Ordered in ED Medications  metroNIDAZOLE (FLAGYL) tablet 500 mg (has no administration in time range)  insulin NPH Human (NOVOLIN N) injection 15 Units (has no administration in time range)  insulin aspart (novoLOG) injection 0-9 Units (has no administration in time range)  pravastatin (PRAVACHOL) tablet 40 mg (has no administration in time range)  furosemide (LASIX) tablet 40 mg (has no administration in time range)  FLUoxetine (PROZAC) capsule 40 mg (has no administration in time range)  amLODipine (NORVASC) tablet 10 mg (has no administration in time range)  buPROPion (WELLBUTRIN XL) 24 hr tablet 150 mg (has no administration in time range)  pantoprazole (PROTONIX) EC tablet 80 mg (has no administration in time range)  ceFEPIme (MAXIPIME) 2 g in sodium chloride 0.9 % 100 mL IVPB (has no administration in time range)  ondansetron (ZOFRAN) tablet 4 mg (has no administration in time range)    Or  ondansetron (ZOFRAN) injection 4 mg (has no administration in time range)  acetaminophen (TYLENOL) tablet 650 mg (has no administration in time range)    Or  acetaminophen (TYLENOL) suppository 650 mg (has no administration in time range)  lisinopril (ZESTRIL) tablet 40 mg (has no administration in time range)  ceFEPIme (MAXIPIME) 2 g in sodium chloride 0.9 % 100 mL IVPB (has no administration in time range)    ED Course  I have reviewed the triage vital signs and the nursing notes.  Pertinent labs & imaging results that were available during my care of  the patient were reviewed by me and considered in my medical decision making (see chart for details).  Clinical Course as of Apr 03 750  Thu Apr 03, 2020  0455 Notified by nursing staff that patient left the ER and returned home.  Charge RN reports that the patient was angry with staff prior to leaving the department.  I called the patient on his home phone number after EMR downtime was over and had a lengthy conversation with both the patient and his wife regarding their frustrations with the process today.  I discussed that I just spoke with the on-call podiatrist, Dr. Paulla Dolly, to confirm the labs and imaging that was required prior to surgery and explained that this can differ among surgical services.  I also addressed concerns regarding the patient having to board overnight in the ER as his primary concern was the uncomfortable ER beds.  Confirmed with charge RN that a hospital bed can be placed in his room if he returns and has to board in the ER overnight.  The patient's wife stated that she was very concerned regarding the fact that the patient had been in the ER for more than 8 hours with no definitive plan and did not feel that the patient should go forward with surgery at this time.  We had a lengthy shared decision-making conversation that included the patient's.  I expressed my concerns that if he had osteomyelitis and he needed to get established with another surgeon that this could possibly take many weeks, which could possibly require a larger amputation than just the hallux.  The patient and his wife also expressed these concerns, and were agreeable to returning to the ER.  His bed has been held, and charge nurse has been made aware.   [MM]    Clinical Course User Index [MM] Datron Brakebill, Laymond Purser, PA-C   MDM Rules/Calculators/A&P                          64 year old male with a history of DVT and PE on Coumadin, diabetes mellitus type 2, PVD, HTN, OSA on CPAP who presents for preoperative labs  for planned bilateral hallux amputation on 7/8 with podiatry.  Per chart review, the patient was seen by Lanae Crumbly, D.P.M., in the clinic on 7/6 who advised the patient to present to [facility] emergency department on 7/7 for admission with planned surgery on Thursday, July 8.  Initially saw patient and please consult to podiatry to discuss preoperative labs and orders that were being requested as these were not clear from the patient's medical record.  Spoke with Dr. Paulla Dolly, podiatry, who stated that the situation was highly unusual and he did not have any specific labs that needed to be ordered.  He also clarified that podiatry does not have admitting privileges at the hospital and the patient would need to be medically admitted.  We will order basic screening labs, PT/INR, COVID-19, EKG, and chest x-ray.  Unfortunately, after speaking with Dr. Paulla Dolly, I was notified by charge nurse that  the patient had left the department after becoming upset that "nothing was being done."  There was a delay in contacting the patient due to unplanned downtime with the EMR, but a reach out to the patient after he left the department and spoke with both him and his wife via the phone for at least 30 minutes answering questions and addressing concerns.  Ultimately, he was agreeable to returning to the department.  INR is 1.7.  His creatinine is at his baseline at 1.6 today.  His EKG is unremarkable.  Chest x-ray has been reviewed by me and is unremarkable.  He does have a mild leukocytosis of 12.  He has been made n.p.o.  Dr. Alcario Drought with the hospitalist team has accepted the patient for admission.  The patient appears reasonably stabilized for admission considering the current resources, flow, and capabilities available in the ED at this time, and I doubt any other [facility] requiring further screening and/or treatment in the ED prior to admission.   Final Clinical Impression(s) / ED Diagnoses Final diagnoses:    Osteomyelitis of great toe of left foot ([place])  Osteomyelitis of great toe of right foot ([place])  Pre-operative clearance    Rx / DC Orders ED Discharge Orders    None       Mellany Dinsmore A, PA-C 04/03/20 0751    Molpus, Jenny Reichmann, MD 04/03/20 2234

## 2020-04-03 NOTE — Progress Notes (Signed)
Pharmacy Antibiotic Note  Joseph [name]. is a 64 y.o. male admitted on 04/02/2020 with diabetic wound infection/osteomyelitis.   He is being admitted for I&D of wound, partial amputation of hallux.  He has been on doxycycline + Augmentin. as outpatient for Pharmacy has been consulted for Cefepime dosing.  Plan: Cefpime 2gm IV q8h Monitor renal function and cx data      Temp (24hrs), Avg:98.1 F (36.7 C), Min:97.6 F (36.4 C), Max:98.5 F (36.9 C)  Recent Labs  Lab 04/03/20 0228  WBC 12.2*  CREATININE 1.65*    CrCl cannot be calculated (Unknown ideal weight.).    Allergies  Allergen Reactions  . Tape     Peels skin on legs    Antimicrobials this admission: 7/8 Cefepime >>  7/8 Flagyl >>   Dose adjustments this admission:  Microbiology results:  6/28 L toe wound cx: Proteus, Pseudomonas- pan-sensitive  Thank you for allowing pharmacy to be a part of this patient's care.  Netta Cedars PharmD 04/03/2020 5:53 AM

## 2020-04-03 NOTE — Consult Note (Signed)
WOC consult requested for bilat foot wounds prior to ortho service involvement.  Progress notes indicate that patient is scheduled for surgery today for bilat great toe wounds with osteomyelitis.  Please refer to their team for further questions. Please re-consult if further assistance is needed.  Thank-you,  Julien Girt MSN, [place], [place], [place], [place]

## 2020-04-03 NOTE — Plan of Care (Signed)

## 2020-04-03 NOTE — Consult Note (Signed)
Subjective:  Patient ID: Kenzel Ruesch., male    DOB: May 10, 1956,  MRN: [id number]  A 64 y.o. male presents with PMH of DM2, HTN, h/o DVT/PE on chronic coumadin, morbid obesity, OSA on CPAP presents with bilateral hallux distal phalanx osteomyelitis with right worse than left side.  There is a wound ulceration that is present that has been correlated directly with the bone underneath it.  Patient has failed multiple wound care treatments.  Cultures have grown out Pseudomonas and Proteus in the past.  Patient and Dr. Sherryle Lis who had referred the patient to be sent to the emergency room agree with bilateral toe amputations.  His INR is 1.7.  Objective:   Vitals:   04/03/20 0610 04/03/20 0624  BP:  (!) 152/42  Pulse:  76  Resp:  17  Temp: 98.4 F (36.9 C) 98.5 F (36.9 C)  SpO2:  96%   warm, good capillary refill, gangrenous changes at right hallux distal tip, gross loss of protective sensation bilateral LEs and ulceration at left hallux tip.  Significant venous insufficiency with +1 pitting edema and hemosiderosis bilateral LEs.  His right leg is wrapped in a dressing for a venous ulceration. Left Foot: 1 cm full-thickness ulceration at the distal tip of the left hallux with serous drainage, fibrogranular wound base.  No cellulitis.  Right Foot: Gangrenous patch present right hallux distal tip with fibropurulent drainage, significant erythema to the MTPJ.  No malodor currently.  No ascending cellulitis.   FINDINGS: Soft tissue ulceration. Ulceration of the distal aspect of the left great toe noted. This is consistent with osteomyelitis. Diffuse degenerative change.  IMPRESSION: Ulceration of the distal aspect of the distal tuft of the left great toe consistent with osteomyelitis.  Soft tissue ulceration noted of the distal aspect of the right great toe. Underlying bony erosion of the distal aspect of the distal phalanx of the right great toe consistent with osteomyelitis. Diffuse  degenerative change.  IMPRESSION: Findings consistent with osteomyelitis of the distal aspect of the distal phalanx of the right great toe.  Assessment & Plan:  Patient was evaluated and treated and all questions answered.  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  Given the amount of gross destruction noted on plain films I do not believe that we need to pursue further starting for MRI and currently is more than enough evidence of osteomyelitis to undergo amputation.  The decision was made today when I saw him earlier today and do plan on taking him to the operating room today.  I discussed this with the patient patient agrees with the plan would like to proceed with bilateral amputation of the hallux.  I plan on performing left partial hallux amputation and right hallux amputation and disarticulation at the level of the metatarsophalangeal joint.  I discussed with the patient I plan on taking as minimal as possible however I do not plan on leaving any infection behind.  Patient agrees with the plan. -I also discussed with the patient that I would plan on leaving it open if there is any signs of infections that are present when I amputate in the operating room.  At the moment I am currently planning on staging this into steps with open amputation today for infection control and closure on Saturday.  Patient agrees with this plan. -INR of 1.7.  I am okay with this INR to proceed with the  amputation.  Rest of the care per anesthesia. -My postop protocol for this includes weightbearing as tolerated with a surgical shoe.  Patient should not need physical therapy as he should be able to ambulate with surgical shoe/postop shoe bilaterally weightbearing as tolerated. -He will not need any dressing changes after the surgery until I see him in clinic. -Informed surgical risk consent was  reviewed and read aloud to the patient.  I reviewed the films.  I have discussed my findings with the patient in great detail.  I have discussed all risks including but not limited to infection, stiffness, scarring, limp, disability, deformity, damage to blood vessels and nerves, numbness, poor healing, need for braces, arthritis, chronic pain, amputation, death.  All benefits and realistic expectations discussed in great detail.  I have made no promises as to the outcome.  I have provided realistic expectations.  I have offered the patient a 2nd opinion, which they have declined and assured me they preferred to proceed despite the risks   Felipa Furnace, DPM  Accessible via secure chat for questions or concerns.

## 2020-04-03 NOTE — ED Notes (Signed)
ED TO INPATIENT HANDOFF REPORT  ED Nurse Name and Phone #:  Altha Harm 2009  S Name/Age/Gender Joseph [name]. 64 y.o. male Room/Bed: WA10/WA10  Code Status   Code Status: Full Code  Home/SNF/Other Home Patient oriented to: self, place, time and situation Is this baseline? Yes   Triage Complete: Triage complete  Chief Complaint Osteomyelitis of great toe of left foot [facility]) [M86.9]  Triage Note Pt to have surgery tomorrow to have right big toe amputated. Reports was told to go to Ed for pre-admission workup like Covid test, EKG, etc.     Allergies Allergies  Allergen Reactions  . Tape     Peels skin on legs    Level of Care/Admitting Diagnosis ED Disposition    ED Disposition Condition [facility] Area: [place] [[id number]]  Level of Care: Med-Surg [16]  May admit patient to [facility] or [facility] if equivalent level of care is available:: No  Covid Evaluation: Asymptomatic Screening Protocol (No Symptoms)  Diagnosis: Osteomyelitis of great toe of left foot [facility]) [[id number]]  Admitting Physician: Doreatha Massed  Attending Physician: Etta Quill [phone]  Estimated length of stay: inpatient only procedure  Certification:: I certify this patient is being admitted for an inpatient-only procedure       B Medical/Surgery History Past Medical History:  Diagnosis Date  . Anxiety state, unspecified   . Depression   . DVT (deep venous thrombosis) ([place]) 2004   Left knee  . Edema   . Esophageal reflux    occ  . Essential hypertension, benign   . Essential hypertension, benign   . Falls frequently   . History of iron deficiency anemia   . Morbid obesity ([place])   . Myalgia and myositis, unspecified    BACK AND LEGS  . OSA on CPAP   . Peripheral vascular disease, unspecified ([place])   . Personal history of PE (pulmonary embolism) 2004  . Polyneuropathy in diabetes(357.2)   . Poor venous access    PT STATES DIFFICULT TO  DRAW BLOOD FROM HIS VEINS  . Pure hypercholesterolemia   . Shortness of breath    DUE TO WEIGHT AND LOSS OF MOBILITY  . Type II or unspecified type diabetes mellitus with neurological manifestations, not stated as uncontrolled(250.60)   . Type II or unspecified type diabetes mellitus without mention of complication, not stated as uncontrolled   . 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 Varicose veins of lower extremities with inflammation   . Wears glasses   . Wound infection after surgery    required hospitalization   Past Surgical History:  Procedure Laterality Date  . COLONOSCOPY WITH PROPOFOL N/A 07/20/2019   Procedure: COLONOSCOPY WITH PROPOFOL;  Surgeon: Wonda Horner, MD;  Location: [facility] ENDOSCOPY;  Service: Endoscopy;  Laterality: N/A;  . NO PAST SURGERIES    . PANNICULECTOMY N/A 07/15/2014   Procedure: PANNICULECTOMY;  Surgeon: Pedro Earls, MD;  Location: [facility] ORS;  Service: General;  Laterality: N/A;  . POLYPECTOMY  07/20/2019   Procedure: POLYPECTOMY;  Surgeon: Wonda Horner, MD;  Location: [facility] ENDOSCOPY;  Service: Endoscopy;;     A IV Location/Drains/Wounds Patient Lines/Drains/Airways Status    Active Line/Drains/Airways    Name Placement date Placement time Site Days   Peripheral IV 04/03/20 Right Hand 04/03/20  0317  Hand  less than 1   PICC / Midline Single Lumen 01/07/15 PICC  Right Basilic 52 cm 2 cm 23/53/61  4431  Basilic  5400   Negative Pressure Wound Therapy Abdomen Mid 12/31/14  --  --  1920   Negative Pressure Wound Therapy Abdomen Right;Medial 01/03/15  --  --  1917   Negative Pressure Wound Therapy Abdomen Right;Lateral 01/02/15  --  --  1918   Incision (Closed) 07/15/14 Abdomen 07/15/14  1146   2089   Wound / Incision (Open or Dehisced) 12/31/14 Other (Comment) Abdomen Left;Lower closed end of incision w/small amt of herniated tissue @closure  site 12/31/14  1205  Abdomen  1920   Wound / Incision  (Open or Dehisced) 12/31/14 Incision - Open Abdomen Right;Lower small opening w/tunneling -wet to dry dressings QOD per wife or [facility] 12/31/14  1205  Abdomen  1920          Intake/Output Last 24 hours No intake or output data in the 24 hours ending 04/03/20 0553  Labs/Imaging Results for orders placed or performed during the hospital encounter of 04/02/20 (from the past 48 hour(s))  Protime-INR     Status: Abnormal   Collection Time: 04/03/20  2:28 AM  Result Value Ref Range   Prothrombin Time 19.6 (H) 11.4 - 15.2 seconds   INR 1.7 (H) 0.8 - 1.2    Comment: (NOTE) INR goal varies based on device and disease states. Performed at [facility], [place] [address] [place], [place] [address]   CBC with Differential     Status: Abnormal   Collection Time: 04/03/20  2:28 AM  Result Value Ref Range   WBC 12.2 (H) 4.0 - 10.5 K/uL   RBC 4.51 4.22 - 5.81 MIL/uL   Hemoglobin 12.9 (L) 13.0 - 17.0 g/dL   HCT 41.2 39 - 52 %   MCV 91.4 80.0 - 100.0 fL   MCH 28.6 26.0 - 34.0 pg   MCHC 31.3 30.0 - 36.0 g/dL   RDW 15.7 (H) 11.5 - 15.5 %   Platelets 305 150 - 400 K/uL   nRBC 0.0 0.0 - 0.2 %   Neutrophils Relative % 67 %   Neutro Abs 8.2 (H) 1.7 - 7.7 K/uL   Lymphocytes Relative 20 %   Lymphs Abs 2.5 0.7 - 4.0 K/uL   Monocytes Relative 9 %   Monocytes Absolute 1.0 0 - 1 K/uL   Eosinophils Relative 3 %   Eosinophils Absolute 0.4 0 - 0 K/uL   Basophils Relative 1 %   Basophils Absolute 0.1 0 - 0 K/uL   Immature Granulocytes 0 %   Abs Immature Granulocytes 0.05 0.00 - 0.07 K/uL    Comment: Performed at [facility], [place] [address], [place], [place] [address]  Comprehensive metabolic panel     Status: Abnormal   Collection Time: 04/03/20  2:28 AM  Result Value Ref Range   Sodium 138 135 - 145 mmol/L   Potassium 4.5 3.5 - 5.1 mmol/L   Chloride 101 98 - 111 mmol/L   CO2 20 (L) 22 - 32 mmol/L   Glucose, Bld 155 (H) 70 - 99 mg/dL    Comment: Glucose  reference range applies only to samples taken after fasting for at least 8 hours.   BUN 43 (H) 8 - 23 mg/dL   Creatinine, Ser 1.65 (H) 0.61 - 1.24 mg/dL   Calcium 8.5 (L) 8.9 - 10.3 mg/dL   Total Protein 6.5 6.5 - 8.1 g/dL   Albumin 3.2 (L) 3.5 - 5.0 g/dL   AST 21 15 -  41 U/L   ALT 15 0 - 44 U/L   Alkaline Phosphatase 71 38 - 126 U/L   Total Bilirubin 0.7 0.3 - 1.2 mg/dL   GFR calc non Af Amer 44 (L) >60 mL/min   GFR calc Af Amer 50 (L) >60 mL/min   Anion gap 17 (H) 5 - 15    Comment: Performed at [facility], [place] [address], [place], [place] [address]  Urinalysis, Routine w reflex microscopic     Status: Abnormal   Collection Time: 04/03/20  2:28 AM  Result Value Ref Range   Color, Urine YELLOW YELLOW   APPearance CLEAR CLEAR   Specific Gravity, Urine 1.018 1.005 - 1.030   pH 5.0 5.0 - 8.0   Glucose, UA NEGATIVE NEGATIVE mg/dL   Hgb urine dipstick NEGATIVE NEGATIVE   Bilirubin Urine NEGATIVE NEGATIVE   Ketones, ur 5 (A) NEGATIVE mg/dL   Protein, ur NEGATIVE NEGATIVE mg/dL   Nitrite NEGATIVE NEGATIVE   Leukocytes,Ua NEGATIVE NEGATIVE    Comment: Performed at [place] [address] [place], [place] [address]   DG Chest 2 View  Result Date: 04/03/2020 CLINICAL DATA:  Preop for surgery EXAM: CHEST - 2 VIEW COMPARISON:  01/03/2015 FINDINGS: Low volume chest similar to prior, with eventration appearance of the right diaphragm. Mild streaky scarring at the left base. Aeration is improved from before. No edema, effusion, or pneumothorax. IMPRESSION: No evidence of active disease. Electronically Signed   By: Monte Fantasia M.D.   On: 04/03/2020 04:07    Pending Labs Unresulted Labs (From admission, onward) Comment          Start     Ordered   04/03/20 0522  MRSA PCR Screening  Once,   STAT        04/03/20 0522   04/03/20 0521  Hemoglobin A1c  Once,   STAT        04/03/20 0522   04/03/20 0521  HIV Antibody (routine testing w rflx)  (HIV  Antibody (Routine testing w reflex) panel)  Once,   STAT        04/03/20 0522   04/03/20 0521  Blood Cultures x 2 sites  BLOOD CULTURE X 2,   R (with STAT occurrences)      04/03/20 0522   04/03/20 0521  Prealbumin  Once,   STAT        04/03/20 0522   04/03/20 0229  SARS Coronavirus 2 by RT PCR (hospital order, performed in [place] hospital lab) Nasopharyngeal Nasopharyngeal Swab  (Tier 2 (TAT 2 hrs))  Once,   STAT       Question Answer Comment  Is this test for diagnosis or screening Screening   Symptomatic for COVID-19 as defined by CDC No   Hospitalized for COVID-19 No   Admitted to ICU for COVID-19 No   Previously tested for COVID-19 Yes   Resident in a congregate (group) care setting No   Employed in healthcare setting No   Has patient completed COVID vaccination(s) (2 doses of Pfizer/Moderna 1 dose of [employer]) Unknown      04/03/20 0229          Vitals/Pain Today's Vitals   04/02/20 2217 04/03/20 0340 04/03/20 0458 04/03/20 0528  BP: (!) 148/56 (!) 155/45 (!) 158/48 (!) 163/47  Pulse: 95 83 84 85  Resp: 16 20 (!) 22 (!) 22  Temp: 98.2 F (36.8 C) 98.5 F (36.9 C)    TempSrc: Oral  Oral    SpO2: 96% 95% 91% 96%  PainSc:        Isolation Precautions No active isolations  Medications Medications  metroNIDAZOLE (FLAGYL) tablet 500 mg (has no administration in time range)  insulin NPH Human (NOVOLIN N) injection 15 Units (has no administration in time range)  insulin aspart (novoLOG) injection 0-9 Units (has no administration in time range)  pravastatin (PRAVACHOL) tablet 40 mg (has no administration in time range)  furosemide (LASIX) tablet 40 mg (has no administration in time range)  FLUoxetine (PROZAC) capsule 40 mg (has no administration in time range)  amLODipine (NORVASC) tablet 10 mg (has no administration in time range)  buPROPion (WELLBUTRIN XL) 24 hr tablet 150 mg (has no administration in time range)  pantoprazole (PROTONIX) EC tablet 80 mg  (has no administration in time range)  ceFEPIme (MAXIPIME) 2 g in sodium chloride 0.9 % 100 mL IVPB (has no administration in time range)  ondansetron (ZOFRAN) tablet 4 mg (has no administration in time range)    Or  ondansetron (ZOFRAN) injection 4 mg (has no administration in time range)  acetaminophen (TYLENOL) tablet 650 mg (has no administration in time range)    Or  acetaminophen (TYLENOL) suppository 650 mg (has no administration in time range)  lisinopril (ZESTRIL) tablet 40 mg (has no administration in time range)    Mobility walks with device Moderate fall risk   Focused Assessments    R Recommendations: See Admitting Provider Note  Report given to:   Additional Notes: PT wears CPAP to sleep

## 2020-04-03 NOTE — ED Notes (Signed)
Per Hospitalist, antibiotics can be given now and he would like to move forward w/ Vas study.  However, it should not delay surgery.

## 2020-04-03 NOTE — Anesthesia Preprocedure Evaluation (Addendum)
Anesthesia Evaluation  Patient identified by MRN, date of birth, ID band Patient awake    Reviewed: Allergy & Precautions, NPO status , Patient's Chart, lab work & pertinent test results  Airway Mallampati: I  TM Distance: >3 FB Neck ROM: Full    Dental  (+) Poor Dentition, Missing, Chipped,    Pulmonary sleep apnea and Continuous Positive Airway Pressure Ventilation ,    breath sounds clear to auscultation       Cardiovascular hypertension, Pt. on medications + Peripheral Vascular Disease   Rhythm:Regular Rate:Normal     Neuro/Psych PSYCHIATRIC DISORDERS Anxiety Depression  Neuromuscular disease    GI/Hepatic GERD  ,  Endo/Other  diabetes, Type 2, Insulin Dependent, Oral Hypoglycemic Agents  Renal/GU Renal InsufficiencyRenal disease     Musculoskeletal negative musculoskeletal ROS (+)   Abdominal (+) + obese,   Peds  Hematology negative hematology ROS (+)   Anesthesia Other Findings   Reproductive/Obstetrics                            Anesthesia Physical Anesthesia Plan  ASA: IV  Anesthesia Plan: MAC   Post-op Pain Management:    Induction: Intravenous  PONV Risk Score and Plan: 3 and Ondansetron, Midazolam and Propofol infusion  Airway Management Planned: Natural Airway and Simple Face Mask  Additional Equipment: None  Intra-op Plan:   Post-operative Plan: Extubation in OR  Informed Consent: I have reviewed the patients History and Physical, chart, labs and discussed the procedure including the risks, benefits and alternatives for the proposed anesthesia with the patient or authorized representative who has indicated his/her understanding and acceptance.     Dental advisory given  Plan Discussed with: CRNA  Anesthesia Plan Comments: (Lab Results      Component                Value               Date                      WBC                      12.2 (H)             04/03/2020                HGB                      12.9 (L)            04/03/2020                HCT                      41.2                04/03/2020                MCV                      91.4                04/03/2020                PLT                      305  04/03/2020             Lab Results      Component                Value               Date                      CREATININE               1.65 (H)            04/03/2020                BUN                      43 (H)              04/03/2020                NA                       138                 04/03/2020                K                        4.5                 04/03/2020                CL                       101                 04/03/2020                CO2                      20 (L)              04/03/2020           )       Anesthesia Quick Evaluation

## 2020-04-03 NOTE — Interval H&P Note (Signed)
Anesthesia H&P Update: History and Physical Exam reviewed; patient is OK for planned anesthetic and procedure. ? ?

## 2020-04-03 NOTE — Progress Notes (Signed)
VASCULAR LAB    Patient had non compressible ABI with normal waveforms 02/21/2020 at the [facility] V[facility] office. Per Dr. Luther Parody note  02/26/2020, patient has venous stasis disease and " does not have any arterial blockages that would make it difficult for him to heal the toe ulcerations."    Please advise if you require further testing.  Thank  You,   Kallista Pae, RVT 04/03/2020, 10:55 AM

## 2020-04-03 NOTE — Op Note (Signed)
Surgeon: Surgeon(s): Felipa Furnace, DPM  Assistants: None  Pre-operative diagnosis: Bilateral hallux osteomyelitis Post-operative diagnosis: same Procedure: Bilateral partial hallux amputation Pathology:  ID Type Source Tests Collected by Time Destination  1 : right great toe Amputation Toe, Right SURGICAL PATHOLOGY Felipa Furnace, DPM 04/03/2020 1059   2 : left great toe Amputation Toe, Left SURGICAL PATHOLOGY Felipa Furnace, DPM 04/03/2020 1100     Pertinent Intra-op findings: Distal phalanx noted to be soft and friable consistent with osteomyelitis to bilateral distal phalanx Anesthesia: General  Hemostasis: Anatomic dissection EBL: 50 cc Materials: Betadine gauze packing, Kerlix, Ace bandage bilaterally Injectables: None Complications: None  Indications for surgery: A 64 y.o. male presents with bilateral hallux distal phalanx osteomyelitis. Patient has failed all conservative therapy including but not limited to local wound care and IV antibiotics. He wishes to have surgical correction of the foot/deformity. It was determined that patient would benefit from bilateral partial hallux osteomyelitis packed open.  This would be stage I of two-stage procedure Informed surgical risk consent was reviewed and read aloud to the patient.  I reviewed the films.  I have discussed my findings with the patient in great detail.  I have discussed all risks including but not limited to infection, stiffness, scarring, limp, disability, deformity, damage to blood vessels and nerves, numbness, poor healing, need for braces, arthritis, chronic pain, amputation, death.  All benefits and realistic expectations discussed in great detail.  I have made no promises as to the outcome.  I have provided realistic expectations.  I have offered the patient a 2nd opinion, which they have declined and assured me they preferred to proceed despite the risks   Procedure in detail: The patient was both verbally and visually  identified by myself, the nursing staff, and anesthesia staff in the preoperative holding area. They were then transferred to the operating room and placed on the operative table in supine position.  Attention was directed to the bilateral hallux a racquet shaped incision was delineated using a surgical skin marker.  A #15 blade was used to carry the incision down to the level of the bone.  The distal phalanx was disarticulated at the level of the interphalangeal joint bilaterally.  There were both sent to pathology in standard technique labeled as right and left side small purulent drainage was noted.  The proximal phalanx was noted to be clear of infection as it was hard and indurated bone.  These findings were consistent for both hallucis.  Head of the proximal phalanx were resected to allow for future closure of the wound.  At this time it was made the decision to keep the wound open to allow for any remanent infection to be drained.  I will plan on returning to the operating room as part of a staged procedure to close both of the wounds on Saturday.  Using a 3 L saline solution both of the open wounds were thoroughly irrigated in standard technique using pulse lavage.  The wounds were packed with Betadine gauze, Kling, Kerlix Ace bandage.  All bony prominences were adequately padded.  I will return to the operating room on Saturday for delayed primary closure  At the conclusion of the procedure the patient was awoken from anesthesia and found to have tolerated the procedure well any complications. There were transferred to PACU with vital signs stable and vascular status intact.  Boneta Lucks, DPM

## 2020-04-03 NOTE — H&P (View-Only) (Signed)
Subjective:  Patient ID: Joseph [name]., male    DOB: 1955/11/02,  MRN: [id number]  A 64 y.o. male presents with PMH of DM2, HTN, h/o DVT/PE on chronic coumadin, morbid obesity, OSA on CPAP presents with bilateral hallux distal phalanx osteomyelitis with right worse than left side.  There is a wound ulceration that is present that has been correlated directly with the bone underneath it.  Patient has failed multiple wound care treatments.  Cultures have grown out Pseudomonas and Proteus in the past.  Patient and Dr. Sherryle Lis who had referred the patient to be sent to the emergency room agree with bilateral toe amputations.  His INR is 1.7.  Objective:   Vitals:   04/03/20 0610 04/03/20 0624  BP:  (!) 152/42  Pulse:  76  Resp:  17  Temp: 98.4 F (36.9 C) 98.5 F (36.9 C)  SpO2:  96%   warm, good capillary refill, gangrenous changes at right hallux distal tip, gross loss of protective sensation bilateral LEs and ulceration at left hallux tip.  Significant venous insufficiency with +1 pitting edema and hemosiderosis bilateral LEs.  His right leg is wrapped in a dressing for a venous ulceration. Left Foot: 1 cm full-thickness ulceration at the distal tip of the left hallux with serous drainage, fibrogranular wound base.  No cellulitis.  Right Foot: Gangrenous patch present right hallux distal tip with fibropurulent drainage, significant erythema to the MTPJ.  No malodor currently.  No ascending cellulitis.   FINDINGS: Soft tissue ulceration. Ulceration of the distal aspect of the left great toe noted. This is consistent with osteomyelitis. Diffuse degenerative change.  IMPRESSION: Ulceration of the distal aspect of the distal tuft of the left great toe consistent with osteomyelitis.  Soft tissue ulceration noted of the distal aspect of the right great toe. Underlying bony erosion of the distal aspect of the distal phalanx of the right great toe consistent with osteomyelitis. Diffuse  degenerative change.  IMPRESSION: Findings consistent with osteomyelitis of the distal aspect of the distal phalanx of the right great toe.  Assessment & Plan:  Patient was evaluated and treated and all questions answered.  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  Given the amount of gross destruction noted on plain films I do not believe that we need to pursue further starting for MRI and currently is more than enough evidence of osteomyelitis to undergo amputation.  The decision was made today when I saw him earlier today and do plan on taking him to the operating room today.  I discussed this with the patient patient agrees with the plan would like to proceed with bilateral amputation of the hallux.  I plan on performing left partial hallux amputation and right hallux amputation and disarticulation at the level of the metatarsophalangeal joint.  I discussed with the patient I plan on taking as minimal as possible however I do not plan on leaving any infection behind.  Patient agrees with the plan. -I also discussed with the patient that I would plan on leaving it open if there is any signs of infections that are present when I amputate in the operating room.  At the moment I am currently planning on staging this into steps with open amputation today for infection control and closure on Saturday.  Patient agrees with this plan. -INR of 1.7.  I am okay with this INR to proceed with the  amputation.  Rest of the care per anesthesia. -My postop protocol for this includes weightbearing as tolerated with a surgical shoe.  Patient should not need physical therapy as he should be able to ambulate with surgical shoe/postop shoe bilaterally weightbearing as tolerated. -He will not need any dressing changes after the surgery until I see him in clinic. -Informed surgical risk consent was  reviewed and read aloud to the patient.  I reviewed the films.  I have discussed my findings with the patient in great detail.  I have discussed all risks including but not limited to infection, stiffness, scarring, limp, disability, deformity, damage to blood vessels and nerves, numbness, poor healing, need for braces, arthritis, chronic pain, amputation, death.  All benefits and realistic expectations discussed in great detail.  I have made no promises as to the outcome.  I have provided realistic expectations.  I have offered the patient a 2nd opinion, which they have declined and assured me they preferred to proceed despite the risks   Felipa Furnace, DPM  Accessible via secure chat for questions or concerns.

## 2020-04-03 NOTE — ED Notes (Signed)
ED Provider at bedside. 

## 2020-04-03 NOTE — Anesthesia Procedure Notes (Signed)
Procedure Name: MAC Date/Time: 04/03/2020 10:32 AM Performed by: Niel Hummer, CRNA Pre-anesthesia Checklist: Patient identified, Emergency Drugs available, Suction available and Patient being monitored Oxygen Delivery Method: Simple face mask

## 2020-04-03 NOTE — Care Plan (Signed)
Joseph [name]. is a 65 y.o. male with medical history significant of DM2, HTN, h/o DVT/PE on chronic coumadin, morbid obesity, OSA on CPAP,  diabetic neuropathy.  Pt has been battling with bilateral great toe ulcers for past weeks-months.  He is being seen by Dr. Dellia Nims (wound care) and [facility]. X rays on 6/28 demonstrated osteomyelitis of both great toes.  He was put on ampicillin and doxycycline.  Despite this wounds have continued to worsen. Culture grew out pseudomonas and proteus. Podiatry has recommended and pt has agreed to Bilateral toe amputations.  Patient was seen and examined postoperatively.  Patient wearing CPAP,  lying comfortably on the bed.  Patient underwent bilateral partial hallux amputation tolerated well.

## 2020-04-03 NOTE — Interval H&P Note (Signed)
History and Physical Interval Note:  04/03/2020 8:41 AM  Joseph [name].  has presented today for surgery, with the diagnosis of bilateral hallux osteomylitis.  The various methods of treatment have been discussed with the patient and family. After consideration of risks, benefits and other options for treatment, the patient has consented to  Procedure(s): BILATERAL HALLUX AMPUTATION (Bilateral) as a surgical intervention.  The patient's history has been reviewed, patient examined, no change in status, stable for surgery.  I have reviewed the patient's chart and labs.  Questions were answered to the patient's satisfaction.     Felipa Furnace

## 2020-04-03 NOTE — ED Notes (Signed)
Pt came out of his room yelling at the staff about having blood drawn, patient continued to argue and yell when the process was being explained to him, then the patient was seen walking out of the ED

## 2020-04-03 NOTE — Progress Notes (Signed)
Initial Nutrition Assessment  DOCUMENTATION CODES:   Morbid obesity  INTERVENTION:   -Ensure MAX Protein po BID, each supplement provides 150 kcal and 30 grams of protein -Multivitamin with minerals daily  NUTRITION DIAGNOSIS:   Increased nutrient needs related to wound healing, post-op healing as evidenced by estimated needs.  GOAL:   Patient will meet greater than or equal to 90% of their needs  MONITOR:   PO intake, Supplement acceptance, Labs, Weight trends, I & O's  REASON FOR ASSESSMENT:   Consult Wound healing  ASSESSMENT:   64 y.o. male presents with PMH of DM2, HTN, h/o DVT/PE on chronic coumadin, morbid obesity, OSA on CPAP presents with bilateral hallux distal phalanx osteomyelitis with right worse than left side.  Patient in OR for bilateral hallux amputations today. Pt was NPO, diet advanced to CHO modified after procedure. Pt would benefit from nutritional supplements for healing from surgery. Will order Ensure Max.  Per weight records, pt has lost 18 lbs since 07/20/19 (3% wt loss x 7.5 months, insignificant for time frame).  Medications reviewed. Labs reviewed:  CBGs: 154-174  NUTRITION - FOCUSED PHYSICAL EXAM:  Unable to complete  Diet Order:   Diet Order            Diet Carb Modified Fluid consistency: Thin; Room service appropriate? Yes  Diet effective now                 EDUCATION NEEDS:   Not appropriate for education at this time  Skin:  Skin Assessment: Skin Integrity Issues: Skin Integrity Issues:: Incisions Incisions: bilateral feet  Last BM:  7/7  Height:   Ht Readings from Last 1 Encounters:  04/03/20 6\' 1"  (1.854 m)    Weight:   Wt Readings from Last 1 Encounters:  04/03/20 (!) 213.2 kg   BMI:  Body mass index is 62.01 kg/m.  Estimated Nutritional Needs:   Kcal:  2300-2500  Protein:  110-120g  Fluid:  2L/day  Clayton Bibles, MS, RD, LDN Inpatient Clinical Dietitian Contact information available via  Amion

## 2020-04-03 NOTE — Addendum Note (Signed)
Addendum  created 04/03/20 1321 by Effie Berkshire, MD   Clinical Note Signed

## 2020-04-03 NOTE — Progress Notes (Signed)
Patient arrived in hospital bed from ED at 0625, A&Ox4 vss, kept NPO for surgery later this afternoon, patient information folder given and patient instructed on call bell use, admission completed, verified with ED nurse Alaina that covid swab was taken to the lab, bedside report given to Thereasa Distance, RN who will be assuming care of this patient.

## 2020-04-03 NOTE — H&P (Addendum)
History and Physical    Joseph [name]. [id number] DOB: [birth year] DOA: 04/02/2020  PCP: Joseph Prose, MD  Patient coming from: Home  I have personally briefly reviewed patient's old medical records in [place]  Chief Complaint: Great toes infected  HPI: Joseph [name]. is a 64 y.o. male with medical history significant of DM2, HTN, h/o DVT/PE on chronic coumadin, morbid obesity, OSA on CPAP.  Pt has diabetic neuropathy.  Pt has been battling with bilateral great toe ulcers for past weeks-months.  He is being seen by Dr. Dellia Nims (wound care) and [facility].  X rays on 6/28 demonstrated osteomyelitis of both great toes.  He was put on ampicillin and doxycycline.  Despite this wounds have continued to worsen.  Culture grew out pseudomonas and proteus.  Podiatry has recd and pt has agreed to B toe amputations.  Podiatry sent pt in to ED last evening for admission to hospital for OR later today.   ED Course: INR 1.7.  Creat 1.6 (baseline).  CXR neg, EKG unremarkable.  WBC 12.2k.   Review of Systems: As per HPI, otherwise all review of systems negative.  Past Medical History:  Diagnosis Date  . Anxiety state, unspecified   . Depression   . DVT (deep venous thrombosis) ([place]) 2004   Left knee  . Edema   . Esophageal reflux    occ  . Essential hypertension, benign   . Essential hypertension, benign   . Falls frequently   . History of iron deficiency anemia   . Morbid obesity ([place])   . Myalgia and myositis, unspecified    BACK AND LEGS  . OSA on CPAP   . Peripheral vascular disease, unspecified ([place])   . Personal history of PE (pulmonary embolism) 2004  . Polyneuropathy in diabetes(357.2)   . Poor venous access    PT STATES DIFFICULT TO DRAW BLOOD FROM HIS VEINS  . Pure hypercholesterolemia   . Shortness of breath    DUE TO WEIGHT AND LOSS OF MOBILITY  . Type II or unspecified type diabetes mellitus with neurological manifestations, not  stated as uncontrolled(250.60)   . Type II or unspecified type diabetes mellitus without mention of complication, not stated as uncontrolled   . 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 Varicose veins of lower extremities with inflammation   . Wears glasses   . Wound infection after surgery    required hospitalization    Past Surgical History:  Procedure Laterality Date  . COLONOSCOPY WITH PROPOFOL N/A 07/20/2019   Procedure: COLONOSCOPY WITH PROPOFOL;  Surgeon: Wonda Horner, MD;  Location: [facility] END[facility]COPY;  Service: Endoscopy;  Laterality: N/A;  . NO PAST SURGERIES    . PANNICULECTOMY N/A 07/15/2014   Procedure: PANNICULECTOMY;  Surgeon: Pedro Earls, MD;  Location: [facility] ORS;  Service: General;  Laterality: N/A;  . POLYPECTOMY  07/20/2019   Procedure: POLYPECTOMY;  Surgeon: Wonda Horner, MD;  Location: [facility] ENDOSCOPY;  Service: Endoscopy;;     reports that he has never smoked. He has never used smokeless tobacco. He reports previous alcohol use. He reports that he does not use drugs.  Allergies  Allergen Reactions  . Tape     Peels skin on legs    No family history on file.   Prior to Admission medications   Medication Sig Start Date End Date Taking? Authorizing Provider  acetaminophen (TYLENOL) 500 MG tablet Take 1,000  mg by mouth daily as needed for moderate pain or headache.    Yes [provider]  amLODipine (NORVASC) 10 MG tablet Take 10 mg by mouth every evening.    Yes [provider]  amoxicillin-clavulanate (AUGMENTIN) 875-125 MG tablet Take 1 tablet by mouth 2 (two) times daily. 03/28/20  Yes [provider]  buPROPion (WELLBUTRIN XL) 150 MG 24 hr tablet Take 150 mg by mouth daily. 10/20/15  Yes [provider]  doxycycline (MONODOX) 100 MG capsule Take 100 mg by mouth 2 (two) times daily. 03/28/20  Yes [provider]  FLUoxetine (PROZAC) 40 MG capsule Take 40 mg by mouth  every evening.    Yes [provider]  FREESTYLE LITE test strip TEST BLOOD SUGAR FOUR TIMES DAILY 02/11/20  Yes [provider]  furosemide (LASIX) 40 MG tablet Take 40 mg by mouth daily.    Yes [provider]  indapamide (LOZOL) 2.5 MG tablet Take 2.5 mg by mouth 2 (two) times daily.    Yes [provider]  insulin aspart (NOVOLOG) 100 UNIT/ML injection Inject 5-20 Units into the skin with breakfast, with lunch, and with evening meal. Sliding scale per meals 02/05/20  Yes [provider]  insulin NPH Human (NOVOLIN N) 100 UNIT/ML injection Inject 30 Units into the skin 2 (two) times daily.    Yes [provider]  metFORMIN (GLUCOPHAGE) 1000 MG tablet Take 1,000 mg by mouth 2 (two) times daily with a meal.   Yes [provider]  omeprazole (PRILOSEC) 40 MG capsule Take 40 mg by mouth daily as needed (acid reflux).    Yes [provider]  pioglitazone (ACTOS) 45 MG tablet Take 45 mg by mouth daily.   Yes [provider]  pravastatin (PRAVACHOL) 40 MG tablet Take 40 mg by mouth every evening.    Yes [provider]  Prenatal Vit-Fe Fumarate-FA (PRENATAL FA PO) Take 1 tablet by mouth daily.   Yes [provider]  quinapril (ACCUPRIL) 40 MG tablet Take 40 mg by mouth daily.    Yes [provider]  silver sulfADIAZINE (SILVADENE) 1 % cream Apply 1 application topically daily. 02/26/20  Yes Early, Arvilla Meres, MD  Wheat Dextrin (BENEFIBER) POWD Take 15 mLs by mouth daily as needed (constipation). Mix in coffee and drink   Yes [provider]  warfarin (COUMADIN) 5 MG tablet Take 5-7.5 mg by mouth See admin instructions. Take 5 mg in the evening on Sun, Mon, Wed, and Fri. Take 7.5 mg in the evening on Tue, Thur, and Sat Patient not taking: Reported on 04/02/2020    [provider]    Physical Exam: Vitals:   04/02/20 2217 04/03/20 0340 04/03/20 0458 04/03/20 0528  BP: (!) 148/56 (!)  155/45 (!) 158/48 (!) 163/47  Pulse: 95 83 84 85  Resp: 16 20 (!) 22 (!) 22  Temp: 98.2 F (36.8 C) 98.5 F (36.9 C)    TempSrc: Oral Oral    SpO2: 96% 95% 91% 96%    Constitutional: NAD, calm, comfortable Eyes: PERRL, lids and conjunctivae normal ENMT: Mucous membranes are moist. Posterior pharynx clear of any exudate or lesions.Normal dentition.  Neck: normal, supple, no masses, no thyromegaly Respiratory: clear to auscultation bilaterally, no wheezing, no crackles. Normal respiratory effort. No accessory muscle use.  Cardiovascular: Regular rate and rhythm, no murmurs / rubs / gallops. No extremity edema. 2+ pedal pulses. No carotid bruits.  Abdomen: no tenderness, no masses palpated. No hepatosplenomegaly. Bowel  sounds positive.  Musculoskeletal: no clubbing / cyanosis. No joint deformity upper and lower extremities. Good ROM, no contractures. Normal muscle tone.  Skin: Ulcers of great toes of both feet. Neurologic: CN 2-12 grossly intact. Sensation intact, DTR normal. Strength 5/5 in all 4.  Psychiatric: Normal judgment and insight. Alert and oriented x 3. Normal mood.    Labs on Admission: I have personally reviewed following labs and imaging studies  CBC: Recent Labs  Lab 04/03/20 0228  WBC 12.2*  NEUTROABS 8.2*  HGB 12.9*  HCT 41.2  MCV 91.4  PLT 902   Basic Metabolic Panel: Recent Labs  Lab 04/03/20 0228  NA 138  K 4.5  CL 101  CO2 20*  GLUCOSE 155*  BUN 43*  CREATININE 1.65*  CALCIUM 8.5*   GFR: CrCl cannot be calculated (Unknown ideal weight.). Liver Function Tests: Recent Labs  Lab 04/03/20 0228  AST 21  ALT 15  ALKPHOS 71  BILITOT 0.7  PROT 6.5  ALBUMIN 3.2*   No results for input(s): LIPASE, AMYLASE in the last 168 hours. No results for input(s): AMMONIA in the last 168 hours. Coagulation Profile: Recent Labs  Lab 04/03/20 0228  INR 1.7*   Cardiac Enzymes: No results for input(s): CKTOTAL, CKMB, CKMBINDEX, TROPONINI in the last 168  hours. BNP (last 3 results) No results for input(s): PROBNP in the last 8760 hours. HbA1C: No results for input(s): HGBA1C in the last 72 hours. CBG: No results for input(s): GLUCAP in the last 168 hours. Lipid Profile: No results for input(s): CHOL, HDL, LDLCALC, TRIG, CHOLHDL, LDLDIRECT in the last 72 hours. Thyroid Function Tests: No results for input(s): TSH, T4TOTAL, FREET4, T3FREE, THYROIDAB in the last 72 hours. Anemia Panel: No results for input(s): VITAMINB12, FOLATE, FERRITIN, TIBC, IRON, RETICCTPCT in the last 72 hours. Urine analysis:    Component Value Date/Time   COLORURINE YELLOW 04/03/2020 0228   APPEARANCEUR CLEAR 04/03/2020 0228   LABSPEC 1.018 04/03/2020 0228   PHURINE 5.0 04/03/2020 0228   GLUCOSEU NEGATIVE 04/03/2020 0228   HGBUR NEGATIVE 04/03/2020 0228   BILIRUBINUR NEGATIVE 04/03/2020 0228   KETONESUR 5 (A) 04/03/2020 0228   PROTEINUR NEGATIVE 04/03/2020 0228   UROBILINOGEN 0.2 12/30/2014 2032   NITRITE NEGATIVE 04/03/2020 0228   LEUKOCYTESUR NEGATIVE 04/03/2020 0228    Radiological Exams on Admission: DG Chest 2 View  Result Date: 04/03/2020 CLINICAL DATA:  Preop for surgery EXAM: CHEST - 2 VIEW COMPARISON:  01/03/2015 FINDINGS: Low volume chest similar to prior, with eventration appearance of the right diaphragm. Mild streaky scarring at the left base. Aeration is improved from before. No edema, effusion, or pneumothorax. IMPRESSION: No evidence of active disease. Electronically Signed   By: Monte Fantasia M.D.   On: 04/03/2020 04:07    EKG: Independently reviewed.  Assessment/Plan Principal Problem:   Osteomyelitis of great toe of left foot (HCC) Active Problems:   Essential hypertension, benign   Hx pulmonary embolism   Diabetes mellitus type 2, uncontrolled (HCC)   Osteomyelitis of great toe of right foot (HCC)   CKD (chronic kidney disease) stage 3, GFR 30-59 ml/min    1. Osteomyelitis of great toes of both feet - 1. Plan is Pt to OR at  1100 today 2. NPO and coumadin on hold (INR 1.7 this morning) 3. Lyndel Safe risk score = 1.1% 4. LE wound pathway 5. Cefepime / flagyl - grew pseudomonas from wound on 6/28 6. MRSA PCR nares pending 7. Wound care consult 2. H/o PE - 1. Coumadin  on hold for surgery 2. INR 1.7 this morning 3. CKD stage 3 - 1. Creat today appears to be baseline 2. Repeat tomorrow AM 4. HTN - 1. Cont home BP meds 2. But holding lasix and lozol while pt NPO 5. DM2 - 1. Holding PO hypoglycemics 2. Holding metformin (actually probably shouldn't be on this med with creat of 1.6 baseline). 3. Will do half home NPH (so 15u BID) while NPO 4. Sensitive SSI Q4H while NPO 5. Diabetes coordinator consult  DVT prophylaxis: SCDs Code Status: Full Family Communication: No family in room Disposition Plan: Home after surgery(s) / infection resolved and podiatry signs off Consults called: None called, Podiatry sent pt in to ED for admission and has him on the surgery schedule for today though Admission status: Admit to inpatient  Severity of Illness: The appropriate patient status for this patient is INPATIENT. Inpatient status is judged to be reasonable and necessary in order to provide the required intensity of service to ensure the patient's safety. The patient's presenting symptoms, physical exam findings, and initial radiographic and laboratory data in the context of their chronic comorbidities is felt to place them at high risk for further clinical deterioration. Furthermore, it is not anticipated that the patient will be medically stable for discharge from the hospital within 2 midnights of admission. The following factors support the patient status of inpatient.   IP status for operative management of osteomyelitis of both feet.  ABx for limb threatening infections of feet.   * I certify that at the point of admission it is my clinical judgment that the patient will require inpatient hospital care spanning beyond 2  midnights from the point of admission due to high intensity of service, high risk for further deterioration and high frequency of surveillance required.*    Diarra Kos M. DO [facility]  How to contact the [facility] Attending or Consulting provider [place] or covering provider during after hours [place], for this patient?  1. Check the care team in [facility] and look for a) attending/consulting [facility] provider listed and b) the [facility] team listed 2. Log into [email or web address]  Amion Physician Scheduling and messaging for groups and whole hospitals  On call and physician scheduling software for group practices, residents, hospitalists and other medical providers for call, clinic, rotation and shift schedules. On[employer] is a hospital-wide system for scheduling doctors and paging doctors on call. EasyPlot is for scientific plotting and data analysis.  [email or web address]  and use [place]'s universal password to access. If you do not have the password, please contact the hospital operator.  3. Locate the [facility] provider you are looking for under [facility] and page to a number that you can be directly reached. 4. If you still have difficulty reaching the provider, please page the [facility] (Director on Call) for the Hospitalists listed on amion for assistance.  04/03/2020, 5:50 AM

## 2020-04-03 NOTE — ED Notes (Addendum)
Patient transported to X-ray 

## 2020-04-04 ENCOUNTER — Ambulatory Visit: Payer: PPO | Admitting: Podiatry

## 2020-04-04 ENCOUNTER — Encounter (HOSPITAL_COMMUNITY): Payer: Self-pay | Admitting: Podiatry

## 2020-04-04 ENCOUNTER — Encounter (HOSPITAL_BASED_OUTPATIENT_CLINIC_OR_DEPARTMENT_OTHER): Payer: PPO | Admitting: Internal Medicine

## 2020-04-04 LAB — COMPREHENSIVE METABOLIC PANEL
ALT: 16 U/L (ref 0–44)
AST: 21 U/L (ref 15–41)
Albumin: 3.5 g/dL (ref 3.5–5.0)
Alkaline Phosphatase: 85 U/L (ref 38–126)
Anion gap: 15 (ref 5–15)
BUN: 45 mg/dL — ABNORMAL HIGH (ref 8–23)
CO2: 26 mmol/L (ref 22–32)
Calcium: 8.9 mg/dL (ref 8.9–10.3)
Chloride: 97 mmol/L — ABNORMAL LOW (ref 98–111)
Creatinine, Ser: 1.58 mg/dL — ABNORMAL HIGH (ref 0.61–1.24)
GFR calc Af Amer: 53 mL/min — ABNORMAL LOW (ref >60)
GFR calc non Af Amer: 46 mL/min — ABNORMAL LOW (ref >60)
Glucose, Bld: 257 mg/dL — ABNORMAL HIGH (ref 70–99)
Potassium: 4.7 mmol/L (ref 3.5–5.1)
Sodium: 138 mmol/L (ref 135–145)
Total Bilirubin: 1 mg/dL (ref 0.3–1.2)
Total Protein: 7.3 g/dL (ref 6.5–8.1)

## 2020-04-04 LAB — CBC
HCT: 40.4 % (ref 39.0–52.0)
Hemoglobin: 12.7 g/dL — ABNORMAL LOW (ref 13.0–17.0)
MCH: 28.7 pg (ref 26.0–34.0)
MCHC: 31.4 g/dL (ref 30.0–36.0)
MCV: 91.2 fL (ref 80.0–100.0)
Platelets: 267 10*3/uL (ref 150–400)
RBC: 4.43 MIL/uL (ref 4.22–5.81)
RDW: 15.5 % (ref 11.5–15.5)
WBC: 6.9 10*3/uL (ref 4.0–10.5)
nRBC: 0 % (ref 0.0–0.2)

## 2020-04-04 LAB — GLUCOSE, CAPILLARY
Glucose-Capillary: 273 mg/dL — ABNORMAL HIGH (ref 70–99)
Glucose-Capillary: 403 mg/dL — ABNORMAL HIGH (ref 70–99)
Glucose-Capillary: 393 mg/dL — ABNORMAL HIGH (ref 70–99)
Glucose-Capillary: 354 mg/dL — ABNORMAL HIGH (ref 70–99)
Glucose-Capillary: 270 mg/dL — ABNORMAL HIGH (ref 70–99)

## 2020-04-04 LAB — GLUCOSE, RANDOM: Glucose, Bld: 411 mg/dL — ABNORMAL HIGH (ref 70–99)

## 2020-04-04 LAB — MAGNESIUM: Magnesium: 2 mg/dL (ref 1.7–2.4)

## 2020-04-04 LAB — PHOSPHORUS: Phosphorus: 3.4 mg/dL (ref 2.5–4.6)

## 2020-04-04 MED ORDER — SODIUM CHLORIDE 0.9 % IV SOLN
INTRAVENOUS | Status: DC | PRN
  Administered 2020-04-04 – 2020-04-05 (×2): 500 mL via INTRAVENOUS

## 2020-04-04 MED ORDER — OXYCODONE HCL 5 MG PO TABS
5.0000 mg | ORAL_TABLET | Freq: Three times a day (TID) | ORAL | Status: DC | PRN
  Administered 2020-04-04 (×2): 5 mg via ORAL
  Filled 2020-04-04 (×2): qty 1

## 2020-04-04 MED ORDER — INSULIN ASPART 100 UNIT/ML ~~LOC~~ SOLN
2.0000 [IU] | Freq: Once | SUBCUTANEOUS | Status: AC
  Administered 2020-04-04: 2 [IU] via SUBCUTANEOUS

## 2020-04-04 NOTE — Progress Notes (Signed)
Shawna Clamp, MD gave verbal orders for oxycodone 5mg  PO q-8 PRN for severe pain.

## 2020-04-04 NOTE — Progress Notes (Signed)
Inpatient Diabetes Program Recommendations  AACE/ADA: New Consensus Statement on Inpatient Glycemic Control (2015)  Target Ranges:  Prepandial:   less than 140 mg/dL      Peak postprandial:   less than 180 mg/dL (1-2 hours)      Critically ill patients:  140 - 180 mg/dL   Lab Results  Component Value Date   GLUCAP 354 (H) 04/04/2020   HGBA1C 7.5 (H) 04/03/2020    Review of Glycemic Control  Diabetes history: DM2 Outpatient Diabetes medications: NPH 30 units bid, Novolog 5-20 units tidwc, metformin 1000 mg bid, Actos 45 mg QD Current orders for Inpatient glycemic control: NPH 15 units bid, Novolog 0-15 units tidwc and hs   HgbA1C - 7.5%  Inpatient Diabetes Program Recommendations:     Increase NPH to 20 units bid Add Novolog 4 units tidwc for meal coverage insulin.  Good glycemic control at home with 7.2% HgbA1C.  Continue to follow.  Thank you. Lorenda Peck, RD, LDN, CDE Inpatient Diabetes Coordinator [phone]

## 2020-04-04 NOTE — Progress Notes (Addendum)
CBG of 403. STAT CBG 411. Pt asymptomatic. Shawna Clamp, MD paged.  Shawna Clamp, MD gave verbal orders to give 15 units (max amount on sliding scale) and an additional 2 units of novolog.

## 2020-04-04 NOTE — Progress Notes (Signed)
PROGRESS NOTE    [name].  EHU:[id number] DOB: April 14, 1956 DOA: 04/02/2020   PCP: Donald Prose, MD   Brief Narrative:  Lorenda Ishihara Jr.is a 64 y.o.malewith medical history significant ofDM2, HTN, h/o DVT/PE on chronic coumadin, morbid obesity, OSA on CPAP,  diabetic neuropathy. Pt has been battling with bilateral great toe ulcers for past weeks-months. He is being seen by Dr. Dellia Nims (wound care) and [facility]. X rays on 6/28 demonstrated osteomyelitis of both great toes. He was put on ampicillin and doxycycline. Despite this wounds have continued to worsen. Culture grew out pseudomonas and proteus. Podiatry has recommended and pt has agreed to Bilateral toe amputation.  Patient was direct admit from Dr. Serita Grit office.  Patient underwent bilateral partial hallux amputation, tolerated well.  Plan: Closure of wound tomorrow in OR.  Assessment & Plan:   Principal Problem:   Osteomyelitis of great toe of left foot (HCC) Active Problems:   Essential hypertension, benign   Hx pulmonary embolism   Diabetes mellitus type 2, uncontrolled (HCC)   Osteomyelitis of great toe of right foot (HCC)   CKD (chronic kidney disease) stage 3, GFR 30-59 ml/min  1. Osteomyelitis of great toes of both feet - 1. 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 2.  Plan: Closure of wound tomorrow 7/10. 3.  coumadin on hold (INR 1.7 this morning) 4. Lyndel Safe risk score = 1.1% 5. LE wound pathway 6. Cefepime / flagyl - grew pseudomonas from wound on 6/28. 7. Antibiotics changed to zosyn 8. MRSA PCR nares negative. 9. Wound care consult 2. H/o PE - 1. Coumadin on hold for surgery. 2. INR 1.7 this morning 3. CKD stage 3 - 1. Creat today appears to be baseline. 2. Repeat tomorrow AM 4. HTN - 1. Cont home BP meds 2. But holding lasix and lozol while pt NPO 5. DM2 - 1. Holding PO hypoglycemics. 2. Holding metformin (actually probably shouldn't be on this med with creat of 1.6  baseline). 3. Will do half home NPH (so 15u BID) while NPO 4. Sensitive SSI Q4H while NPO 5. Diabetes coordinator consult  DVT prophylaxis: SCDs Code Status: Full Family Communication: Wife is at bedside. Disposition Plan: Home after surgery(s) / infection resolved and podiatry signs off. Consults called: Podiatry    Consultants:   Podiatry  Procedures:  Antimicrobials: Anti-infectives (From admission, onward)   Start     Dose/Rate Route Frequency Ordered Stop   04/03/20 1800  ceFEPIme (MAXIPIME) 2 g in sodium chloride 0.9 % 100 mL IVPB  Status:  Discontinued        2 g 200 mL/hr over 30 Minutes Intravenous Every 8 hours 04/03/20 0607 04/03/20 1352   04/03/20 1400  piperacillin-tazobactam (ZOSYN) IVPB 3.375 g     Discontinue     3.375 g 12.5 mL/hr over 240 Minutes Intravenous Every 8 hours 04/03/20 1352     04/03/20 0600  metroNIDAZOLE (FLAGYL) tablet 500 mg  Status:  Discontinued        500 mg Oral Every 8 hours 04/03/20 0522 04/03/20 1352   04/03/20 0545  ceFEPIme (MAXIPIME) 2 g in sodium chloride 0.9 % 100 mL IVPB        2 g 200 mL/hr over 30 Minutes Intravenous  Once 04/03/20 0531 04/03/20 1102      Subjective: Patient was seen and examined at bedside.  No overnight events.  Patient reports having significant amount of pain,  asking for stronger pain medications.  Patient is  status post bilateral partial hallux amputation.  Postoperative day 2   Objective: Vitals:   04/04/20 0619 04/04/20 0822 04/04/20 0920 04/04/20 1352  BP: (!) 161/45 (!) 167/43 (!) 154/46 (!) 143/52  Pulse: 78 79 89 69  Resp: 18  17 18   Temp: 97.6 F (36.4 C)  97.9 F (36.6 C) 98 F (36.7 C)  TempSrc: Oral  Oral   SpO2: 96%  95% 99%  Weight:      Height:        Intake/Output Summary (Last 24 hours) at 04/04/2020 1448 Last data filed at 04/04/2020 1000 Gross per 24 hour  Intake 931.07 ml  Output 1270 ml  Net -338.93 ml   Filed Weights   04/03/20 0624  Weight: (!) 213.2 kg     Examination:  General exam: Appears calm and comfortable  Respiratory system: Clear to auscultation. Respiratory effort normal. Cardiovascular system: S1 & S2 heard, RRR. No JVD, murmurs, rubs, gallops or clicks. No pedal edema. Gastrointestinal system: Abdomen is nondistended, soft and nontender. No organomegaly or masses felt. Normal bowel sounds heard. Central nervous system: Alert and oriented. No focal neurological deficits. Extremities: Status post bilateral partial hallux amputation. Skin: No rashes, lesions or ulcers Psychiatry: Judgement and insight appear normal. Mood & affect appropriate.     Data Reviewed: I have personally reviewed following labs and imaging studies  CBC: Recent Labs  Lab 04/03/20 0228 04/04/20 0529  WBC 12.2* 6.9  NEUTROABS 8.2*  --   HGB 12.9* 12.7*  HCT 41.2 40.4  MCV 91.4 91.2  PLT 305 109   Basic Metabolic Panel: Recent Labs  Lab 04/03/20 0228 04/04/20 0645 04/04/20 1249  NA 138 138  --   K 4.5 4.7  --   CL 101 97*  --   CO2 20* 26  --   GLUCOSE 155* 257* 411*  BUN 43* 45*  --   CREATININE 1.65* 1.58*  --   CALCIUM 8.5* 8.9  --   MG  --  2.0  --   PHOS  --  3.4  --    GFR: Estimated Creatinine Clearance: 90.2 mL/min (A) (by C-G formula based on SCr of 1.58 mg/dL (H)). Liver Function Tests: Recent Labs  Lab 04/03/20 0228 04/04/20 0645  AST 21 21  ALT 15 16  ALKPHOS 71 85  BILITOT 0.7 1.0  PROT 6.5 7.3  ALBUMIN 3.2* 3.5   No results for input(s): LIPASE, AMYLASE in the last 168 hours. No results for input(s): AMMONIA in the last 168 hours. Coagulation Profile: Recent Labs  Lab 04/03/20 0228  INR 1.7*   Cardiac Enzymes: No results for input(s): CKTOTAL, CKMB, CKMBINDEX, TROPONINI in the last 168 hours. BNP (last 3 results) No results for input(s): PROBNP in the last 8760 hours. HbA1C: Recent Labs    04/03/20 0228  HGBA1C 7.5*   CBG: Recent Labs  Lab 04/03/20 1542 04/03/20 2022 04/03/20 2211  04/04/20 0739 04/04/20 1205  GLUCAP 222* 312* 300* 273* 403*   Lipid Profile: No results for input(s): CHOL, HDL, LDLCALC, TRIG, CHOLHDL, LDLDIRECT in the last 72 hours. Thyroid Function Tests: No results for input(s): TSH, T4TOTAL, FREET4, T3FREE, THYROIDAB in the last 72 hours. Anemia Panel: No results for input(s): VITAMINB12, FOLATE, FERRITIN, TIBC, IRON, RETICCTPCT in the last 72 hours. Sepsis Labs: No results for input(s): PROCALCITON, LATICACIDVEN in the last 168 hours.  Recent Results (from the past 240 hour(s))  SARS Coronavirus 2 by RT PCR (hospital order, performed in [facility] hospital  lab) Nasopharyngeal Nasopharyngeal Swab     Status: None   Collection Time: 04/03/20  3:17 AM   Specimen: Nasopharyngeal Swab  Result Value Ref Range Status   SARS Coronavirus 2 NEGATIVE NEGATIVE Final    Comment: (NOTE) SARS-CoV-2 target nucleic acids are NOT DETECTED.  The SARS-CoV-2 RNA is generally detectable in upper and lower respiratory specimens during the acute phase of infection. The lowest concentration of SARS-CoV-2 viral copies this assay can detect is 250 copies / mL. A negative result does not preclude SARS-CoV-2 infection and should not be used as the sole basis for treatment or other patient management decisions.  A negative result may occur with improper specimen collection / handling, submission of specimen other than nasopharyngeal swab, presence of viral mutation(s) within the areas targeted by this assay, and inadequate number of viral copies (<250 copies / mL). A negative result must be combined with clinical observations, patient history, and epidemiological information.  Fact Sheet for Patients:   StrictlyIdeas.no  Fact Sheet for Healthcare Providers: BankingDealers.co.za  This test is not yet approved or  cleared by the [place] FDA and has been authorized for detection and/or diagnosis of SARS-CoV-2  by FDA under an Emergency Use Authorization (EUA).  This EUA will remain in effect (meaning this test can be used) for the duration of the COVID-19 declaration under Section 564(b)(1) of the Act, 21 U.S.C. section 360bbb-3(b)(1), unless the authorization is terminated or revoked sooner.  Performed at [facility], [place] [address] [place], [place] [address]   Blood Cultures x 2 sites     Status: None (Preliminary result)   Collection Time: 04/03/20  8:09 AM   Specimen: BLOOD LEFT HAND  Result Value Ref Range Status   Specimen Description   Final    BLOOD LEFT HAND Performed at [facility] [address] [place], [place] [address]    Special Requests   Final    BOTTLES DRAWN AEROBIC ONLY Blood Culture adequate volume Performed at [facility] [address] [place], [place] [address]    Culture   Final    NO GROWTH 1 DAY Performed at [facility], [place] [address], [place], [place] [address]    Report Status PENDING  Incomplete  Blood Cultures x 2 sites     Status: None (Preliminary result)   Collection Time: 04/03/20  8:09 AM   Specimen: BLOOD RIGHT HAND  Result Value Ref Range Status   Specimen Description   Final    BLOOD RIGHT HAND Performed at [facility] [address], [place], [place] [address]    Special Requests   Final    BOTTLES DRAWN AEROBIC ONLY Blood Culture adequate volume Performed at [facility] [address], [place], [place] [address]    Culture   Final    NO GROWTH 1 DAY Performed at [facility], [place] [address], [place],  [address]    Report Status PENDING  Incomplete  Surgical PCR screen     Status: None   Collection Time: 04/03/20  8:49 AM   Specimen: Nasal Mucosa; Nasal Swab  Result Value Ref Range Status   MRSA, PCR NEGATIVE NEGATIVE Final   Staphylococcus aureus NEGATIVE NEGATIVE Final    Comment: (NOTE) The Xpert SA  Assay (FDA approved for NASAL specimens in patients 74 years of age and older), is one component of a comprehensive surveillance program. It is not intended to diagnose  infection nor to guide or monitor treatment. Performed at [facility], [place] [address], [place], [place] [address]      Radiology Studies: DG Chest 2 View  Result Date: 04/03/2020 CLINICAL DATA:  Preop for surgery EXAM: CHEST - 2 VIEW COMPARISON:  01/03/2015 FINDINGS: Low volume chest similar to prior, with eventration appearance of the right diaphragm. Mild streaky scarring at the left base. Aeration is improved from before. No edema, effusion, or pneumothorax. IMPRESSION: No evidence of active disease. Electronically Signed   By: Monte Fantasia M.D.   On: 04/03/2020 04:07   DG Foot Complete Left  Result Date: 04/03/2020 CLINICAL DATA:  Post amputation of LEFT great toe EXAM: LEFT FOOT - COMPLETE 3+ VIEW COMPARISON:  Portable exam 12.3 hours compared to 03/24/2020 FINDINGS: Post amputation of great toe through midportion of the proximal phalanx. Osseous demineralization. Superimposed dressing artifacts. Degenerative changes and question subluxations at the MTP joints. No acute fracture, dislocation, or bone destruction. IMPRESSION: Post amputation of LEFT great toe through the midportion of the proximal phalanx. Electronically Signed   By: Lavonia Dana M.D.   On: 04/03/2020 12:51   DG Foot Complete Right  Result Date: 04/03/2020 CLINICAL DATA:  Amputation of RIGHT great toe EXAM: RIGHT FOOT COMPLETE - 3+ VIEW COMPARISON:  Portable exam 1216 hours compared to 03/24/2020 FINDINGS: Interval amputation of great toe through the base of the proximal phalanx. Superimposed dressing artifacts. Bones demineralized. Degenerative changes of the MTP joints. No fracture, dislocation or bone destruction. Small plantar calcaneal spur. Atherosclerotic calcifications. IMPRESSION: Post amputation of RIGHT great toe through the level  of the base of the proximal phalanx. Electronically Signed   By: Lavonia Dana M.D.   On: 04/03/2020 12:50    Scheduled Meds: . amLODipine  10 mg Oral QPM  . buPROPion  150 mg Oral Daily  . FLUoxetine  40 mg Oral QPM  . furosemide  40 mg Oral Daily  . insulin aspart  0-15 Units Subcutaneous TID WC  . insulin aspart  0-5 Units Subcutaneous QHS  . insulin NPH Human  15 Units Subcutaneous BID AC & HS  . lisinopril  40 mg Oral Daily  . multivitamin with minerals  1 tablet Oral Daily  . pantoprazole  80 mg Oral Daily  . pravastatin  40 mg Oral QPM  . Ensure Max Protein  11 oz Oral BID   Continuous Infusions: . sodium chloride 500 mL (04/04/20 1347)  . piperacillin-tazobactam (ZOSYN)  IV 3.375 g (04/04/20 1348)     LOS: 1 day    Time spent:25 mins    Shawna Clamp, MD [facility]   If 7PM-7AM, please contact night-coverage

## 2020-04-04 NOTE — Progress Notes (Signed)
Pt. seen for home CPAP, pt. has CPAP s/u, no oxygen use, made aware to notify if needed.

## 2020-04-05 ENCOUNTER — Inpatient Hospital Stay (HOSPITAL_COMMUNITY): Payer: PPO | Admitting: Certified Registered Nurse Anesthetist

## 2020-04-05 ENCOUNTER — Encounter (HOSPITAL_COMMUNITY)
Admission: EM | Disposition: A | Discharge status: Home / Self Care | Payer: Self-pay | Source: Home / Self Care | Attending: Family Medicine

## 2020-04-05 DIAGNOSIS — M86671 Other chronic osteomyelitis, right ankle and foot: Secondary | ICD-10-CM

## 2020-04-05 DIAGNOSIS — M86672 Other chronic osteomyelitis, left ankle and foot: Secondary | ICD-10-CM

## 2020-04-05 DIAGNOSIS — E1165 Type 2 diabetes mellitus with hyperglycemia: Secondary | ICD-10-CM

## 2020-04-05 DIAGNOSIS — Z86711 Personal history of pulmonary embolism: Secondary | ICD-10-CM

## 2020-04-05 HISTORY — PX: I & D EXTREMITY: SHX5045

## 2020-04-05 LAB — COMPREHENSIVE METABOLIC PANEL
ALT: 13 U/L (ref 0–44)
AST: 15 U/L (ref 15–41)
Albumin: 3.1 g/dL — ABNORMAL LOW (ref 3.5–5.0)
Alkaline Phosphatase: 67 U/L (ref 38–126)
Anion gap: 7 (ref 5–15)
BUN: 50 mg/dL — ABNORMAL HIGH (ref 8–23)
CO2: 25 mmol/L (ref 22–32)
Calcium: 8.2 mg/dL — ABNORMAL LOW (ref 8.9–10.3)
Chloride: 99 mmol/L (ref 98–111)
Creatinine, Ser: 1.64 mg/dL — ABNORMAL HIGH (ref 0.61–1.24)
GFR calc Af Amer: 51 mL/min — ABNORMAL LOW (ref >60)
GFR calc non Af Amer: 44 mL/min — ABNORMAL LOW (ref >60)
Glucose, Bld: 261 mg/dL — ABNORMAL HIGH (ref 70–99)
Potassium: 4.2 mmol/L (ref 3.5–5.1)
Sodium: 131 mmol/L — ABNORMAL LOW (ref 135–145)
Total Bilirubin: 1 mg/dL (ref 0.3–1.2)
Total Protein: 6.1 g/dL — ABNORMAL LOW (ref 6.5–8.1)

## 2020-04-05 LAB — CBC
HCT: 40.4 % (ref 39.0–52.0)
Hemoglobin: 12.5 g/dL — ABNORMAL LOW (ref 13.0–17.0)
MCH: 28.3 pg (ref 26.0–34.0)
MCHC: 30.9 g/dL (ref 30.0–36.0)
MCV: 91.4 fL (ref 80.0–100.0)
Platelets: 290 10*3/uL (ref 150–400)
RBC: 4.42 MIL/uL (ref 4.22–5.81)
RDW: 15.5 % (ref 11.5–15.5)
WBC: 9.1 10*3/uL (ref 4.0–10.5)
nRBC: 0 % (ref 0.0–0.2)

## 2020-04-05 LAB — PHOSPHORUS: Phosphorus: 2.8 mg/dL (ref 2.5–4.6)

## 2020-04-05 LAB — GLUCOSE, CAPILLARY
Glucose-Capillary: 290 mg/dL — ABNORMAL HIGH (ref 70–99)
Glucose-Capillary: 219 mg/dL — ABNORMAL HIGH (ref 70–99)
Glucose-Capillary: 302 mg/dL — ABNORMAL HIGH (ref 70–99)

## 2020-04-05 LAB — MAGNESIUM: Magnesium: 1.9 mg/dL (ref 1.7–2.4)

## 2020-04-05 SURGERY — IRRIGATION AND DEBRIDEMENT EXTREMITY
Anesthesia: Monitor Anesthesia Care | Site: Foot | Laterality: Bilateral

## 2020-04-05 MED ORDER — OXYCODONE HCL 5 MG PO TABS: 5.0000 mg | ORAL_TABLET | Freq: Three times a day (TID) | ORAL | 0 refills | Status: DC | PRN

## 2020-04-05 MED ORDER — FENTANYL CITRATE (PF) 100 MCG/2ML IJ SOLN
INTRAMUSCULAR | Status: AC
  Filled 2020-04-05: qty 2

## 2020-04-05 MED ORDER — FENTANYL CITRATE (PF) 100 MCG/2ML IJ SOLN: 25.0000 ug | INTRAMUSCULAR | Status: DC | PRN

## 2020-04-05 MED ORDER — OXYCODONE HCL 5 MG PO TABS: 5.0000 mg | ORAL_TABLET | Freq: Once | ORAL | Status: DC | PRN

## 2020-04-05 MED ORDER — OXYCODONE HCL 5 MG/5ML PO SOLN: 5.0000 mg | Freq: Once | ORAL | Status: DC | PRN

## 2020-04-05 MED ORDER — FENTANYL CITRATE (PF) 100 MCG/2ML IJ SOLN
INTRAMUSCULAR | Status: DC | PRN
  Administered 2020-04-05: 25 ug via INTRAVENOUS

## 2020-04-05 MED ORDER — PROPOFOL 10 MG/ML IV BOLUS
INTRAVENOUS | Status: AC
  Filled 2020-04-05: qty 20

## 2020-04-05 MED ORDER — CHLORHEXIDINE GLUCONATE CLOTH 2 % EX PADS: 6.0000 | MEDICATED_PAD | Freq: Every day | CUTANEOUS | Status: DC

## 2020-04-05 MED ORDER — ONDANSETRON HCL 4 MG/2ML IJ SOLN
INTRAMUSCULAR | Status: DC | PRN
  Administered 2020-04-05: 4 mg via INTRAVENOUS

## 2020-04-05 MED ORDER — 0.9 % SODIUM CHLORIDE (POUR BTL) OPTIME
TOPICAL | Status: DC | PRN
  Administered 2020-04-05: 1000 mL

## 2020-04-05 MED ORDER — DOXYCYCLINE HYCLATE 50 MG PO CAPS
100.0000 mg | ORAL_CAPSULE | Freq: Two times a day (BID) | ORAL | 0 refills | Status: AC
Start: 2020-04-05 — End: 2020-04-12

## 2020-04-05 MED ORDER — BUPIVACAINE HCL (PF) 0.5 % IJ SOLN
INTRAMUSCULAR | Status: DC | PRN
  Administered 2020-04-05: 5 mL

## 2020-04-05 MED ORDER — ONDANSETRON HCL 4 MG/2ML IJ SOLN: 4.0000 mg | Freq: Four times a day (QID) | INTRAMUSCULAR | Status: DC | PRN

## 2020-04-05 MED ORDER — PROPOFOL 1000 MG/100ML IV EMUL
INTRAVENOUS | Status: AC
  Filled 2020-04-05: qty 100

## 2020-04-05 MED ORDER — PROPOFOL 10 MG/ML IV BOLUS
INTRAVENOUS | Status: DC | PRN
  Administered 2020-04-05 (×4): 10 mg via INTRAVENOUS

## 2020-04-05 MED ORDER — PROPOFOL 500 MG/50ML IV EMUL
INTRAVENOUS | Status: DC | PRN
  Administered 2020-04-05: 70 ug/kg/min via INTRAVENOUS

## 2020-04-05 MED ORDER — SODIUM CHLORIDE 0.9 % IR SOLN
Status: DC | PRN
  Administered 2020-04-05: 3000 mL

## 2020-04-05 MED ORDER — ONDANSETRON HCL 4 MG/2ML IJ SOLN
INTRAMUSCULAR | Status: AC
  Filled 2020-04-05: qty 2

## 2020-04-05 MED ORDER — LACTATED RINGERS IV SOLN: INTRAVENOUS | Status: DC

## 2020-04-05 SURGICAL SUPPLY — 29 items
BLADE HEX COATED 2.75 (ELECTRODE) ×2 IMPLANT
BLADE PRESCISION 7.0X.51X18.5 (BLADE) ×2 IMPLANT
BNDG ELASTIC 6X5.8 VLCR STR LF (GAUZE/BANDAGES/DRESSINGS) ×4 IMPLANT
COVER SURGICAL LIGHT HANDLE (MISCELLANEOUS) ×2 IMPLANT
DRAPE U-SHAPE 47X51 STRL (DRAPES) ×2 IMPLANT
DRSG ADAPTIC 3X8 NADH LF (GAUZE/BANDAGES/DRESSINGS) ×2 IMPLANT
DRSG KUZMA FLUFF (GAUZE/BANDAGES/DRESSINGS) ×4 IMPLANT
DURAPREP 26ML APPLICATOR (WOUND CARE) ×2 IMPLANT
ELECT REM PT RETURN 15FT ADLT (MISCELLANEOUS) ×2 IMPLANT
GAUZE SPONGE 4X4 12PLY STRL (GAUZE/BANDAGES/DRESSINGS) ×4 IMPLANT
GLOVE BIOGEL PI IND STRL 8.5 (GLOVE) ×1 IMPLANT
GLOVE BIOGEL PI INDICATOR 8.5 (GLOVE) ×1
GLOVE SURG ORTHO 9.0 STRL STRW (GLOVE) ×2 IMPLANT
HANDPIECE INTERPULSE COAX TIP (DISPOSABLE) ×4
KIT BASIN (CUSTOM PROCEDURE TRAY) ×2 IMPLANT
KIT TURNOVER KIT A (KITS) ×2 IMPLANT
MANIFOLD NEPTUNE II (INSTRUMENTS) ×2 IMPLANT
MATRIX WOUND FLOWABLE 3CC (Tissue) ×2 IMPLANT
PACK ORTHO EXTREMITY (CUSTOM PROCEDURE TRAY) ×2 IMPLANT
PADDING CAST COTTON 6X4 STRL (CAST SUPPLIES) ×2 IMPLANT
PENCIL SMOKE EVACUATOR (MISCELLANEOUS) ×2 IMPLANT
PROTECTOR NERVE ULNAR (MISCELLANEOUS) ×2 IMPLANT
SET HNDPC FAN SPRY TIP SCT (DISPOSABLE) ×2 IMPLANT
SPONGE LAP 18X18 RF (DISPOSABLE) ×2 IMPLANT
STOCKINETTE 8 INCH (MISCELLANEOUS) ×2 IMPLANT
SUT PROLENE 3 0 PS 2 (SUTURE) ×8 IMPLANT
SWAB COLLECTION DEVICE MRSA (MISCELLANEOUS) ×2 IMPLANT
SWAB CULTURE ESWAB REG 1ML (MISCELLANEOUS) ×2 IMPLANT
UNDERPAD 30X36 HEAVY ABSORB (UNDERPADS AND DIAPERS) ×2 IMPLANT

## 2020-04-05 NOTE — Discharge Summary (Signed)
Physician Discharge Summary  Joseph [name]. XLK:[id number] DOB: [birth year] DOA: 04/02/2020  PCP: Donald Prose, MD  Admit date: 04/02/2020 Discharge date: 04/05/2020  Admitted From: Inpatient Disposition: home  Recommendations for Outpatient Follow-up:  1. Follow up with PCP and wound care in 1-2 weeks 2. Please obtain BMP/CBC in one week 3. Podiatry to arrange f/up:  Home Health:Yes Equipment/Devices:surgical shoes per podiatry  Discharge Condition:Stable CODE STATUS:Full code Diet recommendation: resume preadmission diet  Brief/Interim Summary: Joseph Paulis a 64 y.o.malewith medical history significant ofDM2, HTN, h/o DVT/PE on chronic coumadin, morbid obesity, OSA on CPAP,diabetic neuropathy. Pt has been battling with bilateral great toe ulcers for past weeks-months. He is being seen by Dr. Dellia Nims (wound care) and [facility]. X rays on 6/28 demonstrated osteomyelitis of both great toes. He was put on ampicillin and doxycycline. Despite this wounds have continued to worsen. Culture grew out pseudomonas and proteus. Podiatry has recommended and pt has agreed to [facility] amputation.  Patient was direct admit from Dr. Serita Grit office.  Patient underwent bilateral partial hallux amputation, tolerated well.  Plan: Closure of wound   Hospital course: Patient was admitted secondary to osteomyelitis of great toes of both feet.  He was a direct admission from podiatry.  Patient was under primary closure of the wound.  Is placed on broad-spectrum antibiotics initially.  And after surgical evaluation by podiatry they felt infection was cleared he did not need to be discharged on antibiotics for the surgical site.  Patient will continue his home medications on discharge without changes.  He will follow-up closely with podiatry as an outpatient.  Their instructions were for no dressing changes until seen by podiatry. Additionally patient had a wound on right lower  extremity.  He does see wound care on regular basis for nonhealing wounds.  Given the mild erythema around the wound superimposed on venous stasis changes I prescribed an additional 7 days of doxycycline.  Patient will follow up with wound care as an outpatient. Discharge Diagnoses:  Principal Problem:   Osteomyelitis of great toe of left foot (HCC) Active Problems:   Essential hypertension, benign   Hx pulmonary embolism   Diabetes mellitus type 2, uncontrolled (HCC)   Osteomyelitis of great toe of right foot (HCC)   CKD (chronic kidney disease) stage 3, GFR 30-59 ml/min    Discharge Instructions  Discharge Instructions    Call MD for:   Complete by: As directed    Call MD for:  difficulty breathing, headache or visual disturbances   Complete by: As directed    Call MD for:  hives   Complete by: As directed    Call MD for:  persistant nausea and vomiting   Complete by: As directed    Call MD for:  redness, tenderness, or signs of infection (pain, swelling, redness, odor or green/yellow discharge around incision site)   Complete by: As directed    Call MD for:  severe uncontrolled pain   Complete by: As directed    Call MD for:  temperature >100.4   Complete by: As directed    Diet - low sodium heart healthy   Complete by: As directed    Discharge wound care:   Complete by: As directed    No dressing change to surgical site per podiatry   Increase activity slowly   Complete by: As directed      Allergies as of 04/05/2020      Reactions   Tape  Peels skin on legs      Medication List    STOP taking these medications   amoxicillin-clavulanate 875-125 MG tablet Commonly known as: AUGMENTIN   doxycycline 100 MG capsule Commonly known as: MONODOX     TAKE these medications   acetaminophen 500 MG tablet Commonly known as: TYLENOL Take 1,000 mg by mouth daily as needed for moderate pain or headache.   amLODipine 10 MG tablet Commonly known as: NORVASC Take 10  mg by mouth every evening.   Benefiber Powd Take 15 mLs by mouth daily as needed (constipation). Mix in coffee and drink   buPROPion 150 MG 24 hr tablet Commonly known as: WELLBUTRIN XL Take 150 mg by mouth daily.   doxycycline 50 MG capsule Commonly known as: VIBRAMYCIN Take 2 capsules (100 mg total) by mouth 2 (two) times daily for 7 days.   FLUoxetine 40 MG capsule Commonly known as: PROZAC Take 40 mg by mouth every evening.   FREESTYLE LITE test strip Generic drug: glucose blood TEST BLOOD SUGAR FOUR TIMES DAILY   furosemide 40 MG tablet Commonly known as: LASIX Take 40 mg by mouth daily.   indapamide 2.5 MG tablet Commonly known as: LOZOL Take 2.5 mg by mouth 2 (two) times daily.   insulin aspart 100 UNIT/ML injection Commonly known as: novoLOG Inject 5-20 Units into the skin with breakfast, with lunch, and with evening meal. Sliding scale per meals   insulin NPH Human 100 UNIT/ML injection Commonly known as: NOVOLIN N Inject 30 Units into the skin 2 (two) times daily.   metFORMIN 1000 MG tablet Commonly known as: GLUCOPHAGE Take 1,000 mg by mouth 2 (two) times daily with a meal.   omeprazole 40 MG capsule Commonly known as: PRILOSEC Take 40 mg by mouth daily as needed (acid reflux).   oxyCODONE 5 MG immediate release tablet Commonly known as: Oxy IR/ROXICODONE Take 1 tablet (5 mg total) by mouth every 8 (eight) hours as needed for severe pain.   pioglitazone 45 MG tablet Commonly known as: ACTOS Take 45 mg by mouth daily.   pravastatin 40 MG tablet Commonly known as: PRAVACHOL Take 40 mg by mouth every evening.   PRENATAL FA PO Take 1 tablet by mouth daily.   quinapril 40 MG tablet Commonly known as: ACCUPRIL Take 40 mg by mouth daily.   silver sulfADIAZINE 1 % cream Commonly known as: Silvadene Apply 1 application topically daily.   warfarin 5 MG tablet Commonly known as: COUMADIN Take 5-7.5 mg by mouth See admin instructions. Take 5 mg in  the evening on Sun, Mon, Wed, and Fri. Take 7.5 mg in the evening on Tue, Thur, and Sat            Discharge Care Instructions  (From admission, onward)         Start     Ordered   04/05/20 0000  Discharge wound care:       Comments: No dressing change to surgical site per podiatry   04/05/20 1402          Allergies  Allergen Reactions  . Tape     Peels skin on legs    Consultations:  Podiatry   Procedures/Studies: DG Chest 2 View  Result Date: 04/03/2020 CLINICAL DATA:  Preop for surgery EXAM: CHEST - 2 VIEW COMPARISON:  01/03/2015 FINDINGS: Low volume chest similar to prior, with eventration appearance of the right diaphragm. Mild streaky scarring at the left base. Aeration is improved from before. No edema,  effusion, or pneumothorax. IMPRESSION: No evidence of active disease. Electronically Signed   By: Monte Fantasia M.D.   On: 04/03/2020 04:07   DG Foot Complete Left  Result Date: 04/03/2020 CLINICAL DATA:  Post amputation of LEFT great toe EXAM: LEFT FOOT - COMPLETE 3+ VIEW COMPARISON:  Portable exam 12.3 hours compared to 03/24/2020 FINDINGS: Post amputation of great toe through midportion of the proximal phalanx. Osseous demineralization. Superimposed dressing artifacts. Degenerative changes and question subluxations at the MTP joints. No acute fracture, dislocation, or bone destruction. IMPRESSION: Post amputation of LEFT great toe through the midportion of the proximal phalanx. Electronically Signed   By: Lavonia Dana M.D.   On: 04/03/2020 12:51   DG Foot Complete Right  Result Date: 04/03/2020 CLINICAL DATA:  Amputation of RIGHT great toe EXAM: RIGHT FOOT COMPLETE - 3+ VIEW COMPARISON:  Portable exam 1216 hours compared to 03/24/2020 FINDINGS: Interval amputation of great toe through the base of the proximal phalanx. Superimposed dressing artifacts. Bones demineralized. Degenerative changes of the MTP joints. No fracture, dislocation or bone destruction. Small  plantar calcaneal spur. Atherosclerotic calcifications. IMPRESSION: Post amputation of RIGHT great toe through the level of the base of the proximal phalanx. Electronically Signed   By: Lavonia Dana M.D.   On: 04/03/2020 12:50   DG Toe Great Left  Result Date: 03/25/2020 CLINICAL DATA:  Nonhealing wound. EXAM: LEFT GREAT TOE COMPARISON:  No prior. FINDINGS: Soft tissue ulceration. Ulceration of the distal aspect of the left great toe noted. This is consistent with osteomyelitis. Diffuse degenerative change. IMPRESSION: Ulceration of the distal aspect of the distal tuft of the left great toe consistent with osteomyelitis. Electronically Signed   By: [place]   On: 03/25/2020 07:45   DG Toe Great Right  Result Date: 03/25/2020 CLINICAL DATA:  Nonhealing wound. EXAM: RIGHT GREAT TOE COMPARISON:  02/15/2020. FINDINGS: Soft tissue ulceration noted of the distal aspect of the right great toe. Underlying bony erosion of the distal aspect of the distal phalanx of the right great toe consistent with osteomyelitis. Diffuse degenerative change. IMPRESSION: Findings consistent with osteomyelitis of the distal aspect of the distal phalanx of the right great toe. Electronically Signed   By: Marcello Moores  Register   On: 03/25/2020 07:24       Subjective: Patient reports feeling well postoperatively, anxious to be discharged home  Discharge Exam: Vitals:   04/05/20 1245 04/05/20 1304  BP: (!) 140/46 (!) 143/42  Pulse: 71 72  Resp: 14 17  Temp:  98 F (36.7 C)  SpO2: 92% 97%   Vitals:   04/05/20 1215 04/05/20 1230 04/05/20 1245 04/05/20 1304  BP: (!) 142/50 (!) 115/99 (!) 140/46 (!) 143/42  Pulse: 72 73 71 72  Resp: 16 17 14 17   Temp:    98 F (36.7 C)  TempSrc:    Oral  SpO2: 93% 93% 92% 97%  Weight:      Height:        General: Pt is alert, awake, not in acute distress Cardiovascular: RRR, S1/S2 +, no rubs, no gallops Respiratory: CTA bilaterally, no wheezing, no rhonchi Abdominal: Soft,  NT, ND, bowel sounds + Extremities: no edema, no cyanosis surgical site and wrapping with instructions not to take down.  No evidence of bleedthrough.    The results of significant diagnostics from this hospitalization (including imaging, microbiology, ancillary and laboratory) are listed below for reference.     Microbiology: Recent Results (from the past 240 hour(s))  SARS Coronavirus 2  by RT PCR (hospital order, performed in [facility] hospital lab) Nasopharyngeal Nasopharyngeal Swab     Status: None   Collection Time: 04/03/20  3:17 AM   Specimen: Nasopharyngeal Swab  Result Value Ref Range Status   SARS Coronavirus 2 NEGATIVE NEGATIVE Final    Comment: (NOTE) SARS-CoV-2 target nucleic acids are NOT DETECTED.  The SARS-CoV-2 RNA is generally detectable in upper and lower respiratory specimens during the acute phase of infection. The lowest concentration of SARS-CoV-2 viral copies this assay can detect is 250 copies / mL. A negative result does not preclude SARS-CoV-2 infection and should not be used as the sole basis for treatment or other patient management decisions.  A negative result may occur with improper specimen collection / handling, submission of specimen other than nasopharyngeal swab, presence of viral mutation(s) within the areas targeted by this assay, and inadequate number of viral copies (<250 copies / mL). A negative result must be combined with clinical observations, patient history, and epidemiological information.  Fact Sheet for Patients:   StrictlyIdeas.no  Fact Sheet for Healthcare Providers: BankingDealers.co.za  This test is not yet approved or  cleared by the [place] FDA and has been authorized for detection and/or diagnosis of SARS-CoV-2 by FDA under an Emergency Use Authorization (EUA).  This EUA will remain in effect (meaning this test can be used) for the duration of the COVID-19 declaration  under Section 564(b)(1) of the Act, 21 U.S.C. section 360bbb-3(b)(1), unless the authorization is terminated or revoked sooner.  Performed at [facility], [place] [address] [place], [place] [address]   Blood Cultures x 2 sites     Status: None (Preliminary result)   Collection Time: 04/03/20  8:09 AM   Specimen: BLOOD LEFT HAND  Result Value Ref Range Status   Specimen Description   Final    BLOOD LEFT HAND Performed at [place] [address], [place], [place] [address]    Special Requests   Final    BOTTLES DRAWN AEROBIC ONLY Blood Culture adequate volume Performed at [place] [address], [place], [place] [address]    Culture   Final    NO GROWTH 1 DAY Performed at [facility], [place] [address], [place], [place] [address]    Report Status PENDING  Incomplete  Blood Cultures x 2 sites     Status: None (Preliminary result)   Collection Time: 04/03/20  8:09 AM   Specimen: BLOOD RIGHT HAND  Result Value Ref Range Status   Specimen Description   Final    BLOOD RIGHT HAND Performed at [place] [address] [place], [place] [address]    Special Requests   Final    BOTTLES DRAWN AEROBIC ONLY Blood Culture adequate volume Performed at [place] [address], [place], [place] [address]    Culture   Final    NO GROWTH 1 DAY Performed at [facility], [place] [address] [place], [place] [address]    Report Status PENDING  Incomplete  Surgical PCR screen     Status: None   Collection Time: 04/03/20  8:49 AM   Specimen: Nasal Mucosa; Nasal Swab  Result Value Ref Range Status   MRSA, PCR NEGATIVE NEGATIVE Final   Staphylococcus aureus NEGATIVE NEGATIVE Final    Comment: (NOTE) The Xpert SA Assay (FDA approved for NASAL specimens in patients 73 years of age and older), is one component of a  comprehensive surveillance program. It is not intended to  diagnose infection nor to guide or monitor treatment. Performed at [facility], [place] [address], [place], [place] [address]      Labs: BNP (last 3 results) No results for input(s): BNP in the last 8760 hours. Basic Metabolic Panel: Recent Labs  Lab 04/03/20 0228 04/04/20 0645 04/04/20 1249 04/05/20 0338  NA 138 138  --  131*  K 4.5 4.7  --  4.2  CL 101 97*  --  99  CO2 20* 26  --  25  GLUCOSE 155* 257* 411* 261*  BUN 43* 45*  --  50*  CREATININE 1.65* 1.58*  --  1.64*  CALCIUM 8.5* 8.9  --  8.2*  MG  --  2.0  --  1.9  PHOS  --  3.4  --  2.8   Liver Function Tests: Recent Labs  Lab 04/03/20 0228 04/04/20 0645 04/05/20 0338  AST 21 21 15   ALT 15 16 13   ALKPHOS 71 85 67  BILITOT 0.7 1.0 1.0  PROT 6.5 7.3 6.1*  ALBUMIN 3.2* 3.5 3.1*   No results for input(s): LIPASE, AMYLASE in the last 168 hours. No results for input(s): AMMONIA in the last 168 hours. CBC: Recent Labs  Lab 04/03/20 0228 04/04/20 0529 04/05/20 0318  WBC 12.2* 6.9 9.1  NEUTROABS 8.2*  --   --   HGB 12.9* 12.7* 12.5*  HCT 41.2 40.4 40.4  MCV 91.4 91.2 91.4  PLT 305 267 290   Cardiac Enzymes: No results for input(s): CKTOTAL, CKMB, CKMBINDEX, TROPONINI in the last 168 hours. BNP: Invalid input(s): POCBNP CBG: Recent Labs  Lab 04/04/20 1503 04/04/20 1628 04/04/20 2134 04/05/20 0719 04/05/20 1221  GLUCAP 393* 354* 270* 290* 219*   D-Dimer No results for input(s): DDIMER in the last 72 hours. Hgb A1c Recent Labs    04/03/20 0228  HGBA1C 7.5*   Lipid Profile No results for input(s): CHOL, HDL, LDLCALC, TRIG, CHOLHDL, LDLDIRECT in the last 72 hours. Thyroid function studies No results for input(s): TSH, T4TOTAL, T3FREE, THYROIDAB in the last 72 hours.  Invalid input(s): FREET3 Anemia work up No results for input(s): VITAMINB12, FOLATE, FERRITIN, TIBC, IRON, RETICCTPCT in the last 72 hours. Urinalysis    Component Value Date/Time   COLORURINE YELLOW  04/03/2020 0228   APPEARANCEUR CLEAR 04/03/2020 0228   LABSPEC 1.018 04/03/2020 0228   PHURINE 5.0 04/03/2020 0228   GLUCOSEU NEGATIVE 04/03/2020 0228   HGBUR NEGATIVE 04/03/2020 0228   BILIRUBINUR NEGATIVE 04/03/2020 0228   KETONESUR 5 (A) 04/03/2020 0228   PROTEINUR NEGATIVE 04/03/2020 0228   UROBILINOGEN 0.2 12/30/2014 2032   NITRITE NEGATIVE 04/03/2020 0228   LEUKOCYTESUR NEGATIVE 04/03/2020 0228   Sepsis Labs Invalid input(s): PROCALCITONIN,  WBC,  LACTICIDVEN Microbiology Recent Results (from the past 240 hour(s))  SARS Coronavirus 2 by RT PCR (hospital order, performed in [facility] hospital lab) Nasopharyngeal Nasopharyngeal Swab     Status: None   Collection Time: 04/03/20  3:17 AM   Specimen: Nasopharyngeal Swab  Result Value Ref Range Status   SARS Coronavirus 2 NEGATIVE NEGATIVE Final    Comment: (NOTE) SARS-CoV-2 target nucleic acids are NOT DETECTED.  The SARS-CoV-2 RNA is generally detectable in upper and lower respiratory specimens during the acute phase of infection. The lowest concentration of SARS-CoV-2 viral copies this assay can detect is 250 copies / mL. A negative result does not preclude SARS-CoV-2 infection and should not be used as the sole basis  for treatment or other patient management decisions.  A negative result may occur with improper specimen collection / handling, submission of specimen other than nasopharyngeal swab, presence of viral mutation(s) within the areas targeted by this assay, and inadequate number of viral copies (<250 copies / mL). A negative result must be combined with clinical observations, patient history, and epidemiological information.  Fact Sheet for Patients:   StrictlyIdeas.no  Fact Sheet for Healthcare Providers: BankingDealers.co.za  This test is not yet approved or  cleared by the [place] FDA and has been authorized for detection and/or diagnosis of SARS-CoV-2  by FDA under an Emergency Use Authorization (EUA).  This EUA will remain in effect (meaning this test can be used) for the duration of the COVID-19 declaration under Section 564(b)(1) of the Act, 21 U.S.C. section 360bbb-3(b)(1), unless the authorization is terminated or revoked sooner.  Performed at [facility], [place] [address] [place], [place] [address]   Blood Cultures x 2 sites     Status: None (Preliminary result)   Collection Time: 04/03/20  8:09 AM   Specimen: BLOOD LEFT HAND  Result Value Ref Range Status   Specimen Description   Final    BLOOD LEFT HAND Performed at [place] [address] [place], [place] [address]    Special Requests   Final    BOTTLES DRAWN AEROBIC ONLY Blood Culture adequate volume Performed at [place] [address], [place], [place] [address]    Culture   Final    NO GROWTH 1 DAY Performed at [facility], [place] [address] [place], [place] [address]    Report Status PENDING  Incomplete  Blood Cultures x 2 sites     Status: None (Preliminary result)   Collection Time: 04/03/20  8:09 AM   Specimen: BLOOD RIGHT HAND  Result Value Ref Range Status   Specimen Description   Final    BLOOD RIGHT HAND Performed at [place] [address] [place], [place] [address]    Special Requests   Final    BOTTLES DRAWN AEROBIC ONLY Blood Culture adequate volume Performed at [place] [address] [place], [place] [address]    Culture   Final    NO GROWTH 1 DAY Performed at [facility], [place] [address] [place], [place] [address]    Report Status PENDING  Incomplete  Surgical PCR screen     Status: None   Collection Time: 04/03/20  8:49 AM   Specimen: Nasal Mucosa; Nasal Swab  Result Value Ref Range Status   MRSA, PCR NEGATIVE NEGATIVE Final   Staphylococcus aureus NEGATIVE NEGATIVE Final    Comment: (NOTE) The Xpert SA  Assay (FDA approved for NASAL specimens in patients 55 years of age and older), is one component of a comprehensive surveillance program. It is not intended to diagnose infection nor to guide or monitor treatment. Performed at [facility], [place] [address], [place], [place] [address]      Time coordinating discharge: Over 30 minutes  SIGNED:   Nicolette Bang, MD  [facility] 04/05/2020, 2:04 PM Pager   If 7PM-7AM, please contact night-coverage [email or web address] Password [email or web address]

## 2020-04-05 NOTE — Progress Notes (Signed)
Patient has Home CPAP that he is wearing while here at the hospital.

## 2020-04-05 NOTE — Consult Note (Signed)
[facility] Nurse Consult Note: Reason for Consult:Nonhealing RLE wound Wound type: Infectious  WOC Nursing was simultaneous consulted with Podiatric Medicine and patient underwent and operative procedure this morning with Dr. Keith Rake. This is Stage 2/2 procedure.  Dressing orders have been provided by Dr. Posey Pronto.  [facility] nursing team will not follow, but will remain available to this patient, the nursing and medical teams.  Please re-consult if needed. Thanks, Maudie Flakes, MSN, RN, [place], Arther Abbott  Pager# [phone]

## 2020-04-05 NOTE — Progress Notes (Signed)
Orthopedic Tech Progress Note Patient Details:  Joseph [name] 1956-04-12 [id number]  Ortho Devices Type of Ortho Device: Postop shoe/boot Ortho Device/Splint Location: Bilateral Ortho Device/Splint Interventions: Application   Post Interventions Patient Tolerated: Well Instructions Provided: Care of device   [place] Pink 04/05/2020, 2:56 PM

## 2020-04-05 NOTE — Anesthesia Postprocedure Evaluation (Signed)
Anesthesia Post Note  Patient: Joseph [name].  Procedure(s) Performed: IRRIGATION AND DEBRIDEMENT EXTREMITY WITH CLOSURE - BILATERALLY (Bilateral Foot)     Patient location during evaluation: PACU Anesthesia Type: MAC Level of consciousness: awake and alert Pain management: pain level controlled Vital Signs Assessment: post-procedure vital signs reviewed and stable Respiratory status: spontaneous breathing, nonlabored ventilation, respiratory function stable and patient connected to nasal cannula oxygen Cardiovascular status: stable and blood pressure returned to baseline Postop Assessment: no apparent nausea or vomiting Anesthetic complications: no   No complications documented.  Last Vitals:  Vitals:   04/05/20 1245 04/05/20 1304  BP: (!) 140/46 (!) 143/42  Pulse: 71 72  Resp: 14 17  Temp:  36.7 C  SpO2: 92% 97%    Last Pain:  Vitals:   04/05/20 1304  TempSrc: Oral  PainSc:                  [name]

## 2020-04-05 NOTE — Transfer of Care (Signed)
Immediate Anesthesia Transfer of Care Note  Patient: Joseph [name].  Procedure(s) Performed: IRRIGATION AND DEBRIDEMENT EXTREMITY WITH CLOSURE - BILATERALLY (Bilateral Foot)  Patient Location: PACU  Anesthesia Type:MAC  Level of Consciousness: awake, alert  and oriented  Airway & Oxygen Therapy: Patient Spontanous Breathing and Patient connected to face mask oxygen  Post-op Assessment: Report given to RN, Post -op Vital signs reviewed and stable and Patient moving all extremities X 4  Post vital signs: Reviewed and stable  Last Vitals:  Vitals Value Taken Time  BP 137/39 04/05/20 1200  Temp    Pulse 75 04/05/20 1201  Resp 19 04/05/20 1201  SpO2 100 % 04/05/20 1201  Vitals shown include unvalidated device data.  Last Pain:  Vitals:   04/05/20 0744  TempSrc:   PainSc: 0-No pain      Patients Stated Pain Goal: 3 (33/35/45 6256)  Complications: No complications documented.

## 2020-04-05 NOTE — Anesthesia Procedure Notes (Signed)
Procedure Name: MAC Date/Time: 04/05/2020 11:11 AM Performed by: Niel Hummer, CRNA Pre-anesthesia Checklist: Patient identified, Emergency Drugs available, Suction available and Patient being monitored Oxygen Delivery Method: Simple face mask

## 2020-04-05 NOTE — Op Note (Signed)
Surgeon: Surgeon(s): Felipa Furnace, DPM  Assistants: None Pre-operative diagnosis: BILATERAL HALLUX OSTEOMYELITIS; ABSCESS  Post-operative diagnosis: same Procedure: 1.  Delayed primary closure of bilateral hallux amputation Pathology: * No specimens in log *  Pertinent Intra-op findings: Good bleeding noted.  No further signs of infection noted. Anesthesia: Monitor Anesthesia Care  Hemostasis: * No tourniquets in log * EBL: 20 cc  Materials: 3-0 Prolene Injectables: 3 cc of Integra flow matrix Complications: None  Indications for surgery: A 64 y.o. male presents with bilateral hallux osteomyelitis. Patient has failed all conservative therapy including but not limited to IV antibiotics and local wound care. He wishes to have surgical correction of the foot/deformity. It was determined that patient would benefit from bilateral partial hallux amputation with delayed primary closure.  This is stage II of two-stage procedure.. Informed surgical risk consent was reviewed and read aloud to the patient.  I reviewed the films.  I have discussed my findings with the patient in great detail.  I have discussed all risks including but not limited to infection, stiffness, scarring, limp, disability, deformity, damage to blood vessels and nerves, numbness, poor healing, need for braces, arthritis, chronic pain, amputation, death.  All benefits and realistic expectations discussed in great detail.  I have made no promises as to the outcome.  I have provided realistic expectations.  I have offered the patient a 2nd opinion, which they have declined and assured me they preferred to proceed despite the risks   Procedure in detail: The patient was both verbally and visually identified by myself, the nursing staff, and anesthesia staff in the preoperative holding area. They were then transferred to the operating room and placed on the operative table in supine position.  Bilateral hallux amputation delayed  primary closure Attention was directed to the bilateral hallux open amputation site, rongeur was used to debride any nonviable necrotic tissue down to healthy bleeding tissue.  No clinical signs of infection noted.  At this time it was determined that patient can be primarily closed.  Using 3 L saline solution both of the amputation site were thoroughly irrigated in standard technique.  Delayed primary closure were done to both open amputation site in standard technique using 3-0 Prolene in simple and interrupted suture technique.  The wound was dressed with Betadine wet-to-dry dressings, Kerlix, Ace bandage.  All bony prominences were adequately padded.  Patient will be weightbearing as tolerated in a flat surgical shoe.  At the conclusion of the procedure the patient was awoken from anesthesia and found to have tolerated the procedure well any complications. There were transferred to PACU with vital signs stable and vascular status intact.  Boneta Lucks, DPM

## 2020-04-05 NOTE — TOC Initial Note (Signed)
Transition of Care (TOC) - Initial/Assessment Note    Patient Details  Name: Joseph [name]. MRN: [id number] Date of Birth: 16-Mar-1956  Transition of Care [facility]) CM/SW Contact:    Joaquin Courts, RN Phone Number: 04/05/2020, 3:42 PM  Clinical Narrative:     [employer] given referral for wheelchair, unfortunately the proper size wheelchair is not available today.  Patient is agreeable to have the wheelchair shipped to his home in the proper size and understands that there will be a wait time for the shipment to arrive.               Expected Discharge Plan: Home/Self Care     Patient Goals and CMS Choice   CMS Medicare.gov Compare Post Acute Care list provided to:: Patient Choice offered to / list presented to : Patient  Expected Discharge Plan and Services Expected Discharge Plan: Home/Self Care   Discharge Planning Services: CM Consult Post Acute Care Choice: [place] arrangements for the past 2 months: Single Family Home Expected Discharge Date: 04/05/20               DME Arranged: Wheelchair manual DME Agency: [employer] Date DME Agency Contacted: 04/05/20 Time DME Agency Contacted: [phone] Representative spoke with at DME Agency: keon            Prior Living Arrangements/Services Living arrangements for the past 2 months: [place] Lives with:: Spouse Patient language and need for interpreter reviewed:: Yes Do you feel safe going back to the place where you live?: Yes      Need for Family Participation in Patient Care: No (Comment) Care giver support system in place?: Yes (comment)   Criminal Activity/Legal Involvement Pertinent to Current Situation/Hospitalization: No - Comment as needed  Activities of Daily Living Home Assistive Devices/Equipment: Other (Comment) (rollator) ADL Screening (condition at time of admission) Patient's cognitive ability adequate to safely complete daily activities?: Yes Is the patient deaf or have difficulty  hearing?: No Does the patient have difficulty seeing, even when wearing glasses/contacts?: No Does the patient have difficulty concentrating, remembering, or making decisions?: No Patient able to express need for assistance with ADLs?: Yes Does the patient have difficulty dressing or bathing?: Yes Independently performs ADLs?: No Communication: Independent Dressing (OT): Independent (needs help with socks) Grooming: Independent Feeding: Independent Bathing: Needs assistance Is this a change from baseline?: Pre-admission baseline Toileting: Independent In/Out Bed: Independent Walks in Home: Independent Does the patient have difficulty walking or climbing stairs?: No Weakness of Legs: None Weakness of Arms/Hands: None  Permission Sought/Granted                  Emotional Assessment Appearance:: Appears stated age Attitude/Demeanor/Rapport: Engaged Affect (typically observed): Accepting Orientation: : Oriented to Self, Oriented to Place, Oriented to  Time, Oriented to Situation   Psych Involvement: No (comment)  Admission diagnosis:  Osteomyelitis of great toe of left foot [facility]) [M86.9] Patient Active Problem List   Diagnosis Date Noted  . Osteomyelitis of great toe of right foot ([place]) 04/03/2020  . Osteomyelitis of great toe of left foot ([place]) 04/03/2020  . CKD (chronic kidney disease) stage 3, GFR 30-59 ml/min 04/03/2020  . Monitoring for anticoagulant use 02/19/2020  . Accumulation of fluid in tissues 02/19/2020  . Anxiety state 02/19/2020  . Diabetes mellitus type 2, uncontrolled ([place]) 02/19/2020  . Diabetes mellitus with polyneuropathy ([place]) 02/19/2020  . Fibrositis 02/19/2020  . Peripheral blood vessel disorder ([place]) 02/19/2020  . Varicose veins of  lower extremities with inflammation 02/19/2020  . Acute renal insufficiency 01/03/2015  . Esophageal reflux 01/02/2015  . Polyneuropathy in diabetes(357.2) 01/02/2015  . Abscess of postoperative wound of abdominal  wall 01/02/2015  . Cellulitis and abscess of trunk 12/31/2014  . S/P IVC filter-temporary REMOVED   . Status post panniculectomy Oct 2015 07/17/2014  . Morbid obesity BMI 65 03/22/2014  . Pure hypercholesterolemia 03/22/2014  . Essential hypertension, benign 03/22/2014  . Diabetes ([place]) 03/22/2014  . Panniculitis 03/22/2014  . OSA (obstructive sleep apnea) 03/22/2014  . Hx pulmonary embolism 03/22/2014  . Diabetic neuropathy ([place]) 02/20/2014   PCP:  Donald Prose, MD Pharmacy:   [facility] [place], [place] [address] Phone: [phone] Fax: [phone]     Social Determinants of Health (SDOH) Interventions    Readmission Risk Interventions No flowsheet data found.

## 2020-04-05 NOTE — Interval H&P Note (Signed)
History and Physical Interval Note:  04/05/2020 9:06 AM  Joseph [name].  has presented today for surgery, with the diagnosis of [place]; ABSCESS.  The various methods of treatment have been discussed with the patient and family. After consideration of risks, benefits and other options for treatment, the patient has consented to  Procedure(s): IRRIGATION AND DEBRIDEMENT EXTREMITY WITH CLOSURE (Bilateral) as a surgical intervention.  The patient's history has been reviewed, patient examined, no change in status, stable for surgery.  I have reviewed the patient's chart and labs.  Questions were answered to the patient's satisfaction.     Joseph [name]

## 2020-04-05 NOTE — Progress Notes (Signed)
Inpatient Diabetes Program Recommendations  AACE/ADA: New Consensus Statement on Inpatient Glycemic Control (2015)  Target Ranges:  Prepandial:   less than 140 mg/dL      Peak postprandial:   less than 180 mg/dL (1-2 hours)      Critically ill patients:  140 - 180 mg/dL   Lab Results  Component Value Date   GLUCAP 290 (H) 04/05/2020   HGBA1C 7.5 (H) 04/03/2020    Review of Glycemic Control  Blood sugars above goal of 140-180 mg/dL.  Inpatient Diabetes Program Recommendations:     Increase NPH to 22 units bid Add Novolog 8 units tidwc for meal coverage insulin if pt eats > 50% meal  Continue to watch glucose trends.  Thank you. Lorenda Peck, RD, LDN, CDE Inpatient Diabetes Coordinator [phone]

## 2020-04-05 NOTE — Anesthesia Preprocedure Evaluation (Signed)
Anesthesia Evaluation  Patient identified by MRN, date of birth, ID band Patient awake    Reviewed: Allergy & Precautions, H&P , NPO status , Patient's Chart, lab work & pertinent test results  Airway Mallampati: II   Neck ROM: full    Dental   Pulmonary shortness of breath, sleep apnea ,    breath sounds clear to auscultation       Cardiovascular hypertension, + Peripheral Vascular Disease   Rhythm:regular Rate:Normal     Neuro/Psych PSYCHIATRIC DISORDERS Anxiety Depression  Neuromuscular disease    GI/Hepatic GERD  ,  Endo/Other  diabetes, Type 2Morbid obesity  Renal/GU Renal InsufficiencyRenal disease     Musculoskeletal   Abdominal   Peds  Hematology   Anesthesia Other Findings   Reproductive/Obstetrics                             Anesthesia Physical Anesthesia Plan  ASA: III  Anesthesia Plan: MAC   Post-op Pain Management:    Induction: Intravenous  PONV Risk Score and Plan: 1 and Propofol infusion and Treatment may vary due to age or medical condition  Airway Management Planned: Simple Face Mask  Additional Equipment:   Intra-op Plan:   Post-operative Plan:   Informed Consent: I have reviewed the patients History and Physical, chart, labs and discussed the procedure including the risks, benefits and alternatives for the proposed anesthesia with the patient or authorized representative who has indicated his/her understanding and acceptance.       Plan Discussed with: CRNA, Anesthesiologist and Surgeon  Anesthesia Plan Comments:         Anesthesia Quick Evaluation

## 2020-04-05 NOTE — Progress Notes (Signed)
    Durable Medical Equipment  (From admission, onward)         Start     Ordered   04/05/20 1428  For home use only DME lightweight manual wheelchair with seat cushion  Once       Comments: Patient suffers from  osteomyelitis right and left foot which required amputation of bilat big toes, which impairs his ability to perform daily activities like adl's in the home.  A walker will not resolve  issue with performing activities of daily living. A wheelchair will allow patient to safely perform daily activities. Patient is not able to propel themselves in the home using a standard weight wheelchair due to weakness and limited mobility. . Patient can self propel in the lightweight wheelchair. Length of need to be determined by patient recovery. Accessories: elevating leg rests (ELRs), wheel locks, extensions and anti-tippers.   04/05/20 1433   04/05/20 1425  For home use only DME wheelchair cushion (seat and back)  Once       Comments: Pt will need a wide wheelchair   04/05/20 1425

## 2020-04-05 NOTE — Progress Notes (Signed)
Pt provided with d/c instructions. After discussing the pt's plan of care upon d/c home, the pt denied any further questions or concerns.  

## 2020-04-07 ENCOUNTER — Encounter (HOSPITAL_COMMUNITY): Payer: Self-pay | Admitting: Podiatry

## 2020-04-07 LAB — SURGICAL PATHOLOGY

## 2020-04-08 LAB — CULTURE, BLOOD (ROUTINE X 2)
Culture: NO GROWTH
Culture: NO GROWTH
Special Requests: ADEQUATE
Special Requests: ADEQUATE

## 2020-04-09 DIAGNOSIS — E1142 Type 2 diabetes mellitus with diabetic polyneuropathy: Secondary | ICD-10-CM | POA: Diagnosis not present

## 2020-04-09 DIAGNOSIS — Z794 Long term (current) use of insulin: Secondary | ICD-10-CM | POA: Diagnosis not present

## 2020-04-09 DIAGNOSIS — Z89411 Acquired absence of right great toe: Secondary | ICD-10-CM | POA: Diagnosis not present

## 2020-04-09 DIAGNOSIS — Z89422 Acquired absence of other left toe(s): Secondary | ICD-10-CM | POA: Diagnosis not present

## 2020-04-11 ENCOUNTER — Ambulatory Visit (INDEPENDENT_AMBULATORY_CARE_PROVIDER_SITE_OTHER): Payer: PPO | Admitting: Podiatry

## 2020-04-11 ENCOUNTER — Other Ambulatory Visit: Payer: Self-pay

## 2020-04-11 DIAGNOSIS — E11621 Type 2 diabetes mellitus with foot ulcer: Secondary | ICD-10-CM

## 2020-04-11 DIAGNOSIS — Z89419 Acquired absence of unspecified great toe: Secondary | ICD-10-CM

## 2020-04-11 DIAGNOSIS — L97522 Non-pressure chronic ulcer of other part of left foot with fat layer exposed: Secondary | ICD-10-CM | POA: Diagnosis not present

## 2020-04-11 DIAGNOSIS — E1142 Type 2 diabetes mellitus with diabetic polyneuropathy: Secondary | ICD-10-CM

## 2020-04-15 ENCOUNTER — Encounter: Payer: Self-pay | Admitting: Podiatry

## 2020-04-15 NOTE — Progress Notes (Signed)
Subjective:  Patient ID: Joseph Lopezperez., male    DOB: [birth year],  MRN: [id number]  No chief complaint on file.   DOS: 04/05/2020 Procedure: Bilateral partial hallux amputation  64 y.o. male returns for post-op check.  Patient states he is doing well.  He denies any other acute complaints.  He finished his antibiotic course.  His dressing is clean dry and intact.  No clinical signs of infection noted.  Review of Systems: Negative except as noted in the HPI. Denies N/V/F/Ch.  Past Medical History:  Diagnosis Date  . Anxiety state, unspecified   . Depression   . DVT (deep venous thrombosis) ([place]) 2004   Left knee  . Edema   . Esophageal reflux    occ  . Essential hypertension, benign   . Essential hypertension, benign   . Falls frequently   . History of iron deficiency anemia   . Morbid obesity ([place])   . Myalgia and myositis, unspecified    BACK AND LEGS  . OSA on CPAP   . Peripheral vascular disease, unspecified ([place])   . Personal history of PE (pulmonary embolism) 2004  . Polyneuropathy in diabetes(357.2)   . Poor venous access    PT STATES DIFFICULT TO DRAW BLOOD FROM HIS VEINS  . Pure hypercholesterolemia   . Shortness of breath    DUE TO WEIGHT AND LOSS OF MOBILITY  . Type II or unspecified type diabetes mellitus with neurological manifestations, not stated as uncontrolled(250.60)   . Type II or unspecified type diabetes mellitus without mention of complication, not stated as uncontrolled   . 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 Varicose veins of lower extremities with inflammation   . Wears glasses   . Wound infection after surgery    required hospitalization    Current Outpatient Medications:  .  acetaminophen (TYLENOL) 500 MG tablet, Take 1,000 mg by mouth daily as needed for moderate pain or headache. , Disp: , Rfl:  .  amLODipine (NORVASC) 10 MG tablet, Take 10 mg by mouth every evening. , Disp: ,  Rfl:  .  buPROPion (WELLBUTRIN XL) 150 MG 24 hr tablet, Take 150 mg by mouth daily., Disp: , Rfl: 1 .  FLUoxetine (PROZAC) 40 MG capsule, Take 40 mg by mouth every evening. , Disp: , Rfl:  .  FREESTYLE LITE test strip, TEST BLOOD SUGAR FOUR TIMES DAILY, Disp: , Rfl:  .  furosemide (LASIX) 40 MG tablet, Take 40 mg by mouth daily. , Disp: , Rfl:  .  indapamide (LOZOL) 2.5 MG tablet, Take 2.5 mg by mouth 2 (two) times daily. , Disp: , Rfl:  .  insulin aspart (NOVOLOG) 100 UNIT/ML injection, Inject 5-20 Units into the skin with breakfast, with lunch, and with evening meal. Sliding scale per meals, Disp: , Rfl:  .  insulin NPH Human (NOVOLIN N) 100 UNIT/ML injection, Inject 30 Units into the skin 2 (two) times daily. , Disp: , Rfl:  .  metFORMIN (GLUCOPHAGE) 1000 MG tablet, Take 1,000 mg by mouth 2 (two) times daily with a meal., Disp: , Rfl:  .  omeprazole (PRILOSEC) 40 MG capsule, Take 40 mg by mouth daily as needed (acid reflux). , Disp: , Rfl:  .  oxyCODONE (OXY IR/ROXICODONE) 5 MG immediate release tablet, Take 1 tablet (5 mg total) by mouth every 8 (eight) hours as needed for severe pain., Disp: 30 tablet, Rfl: 0 .  pioglitazone (ACTOS) 45 MG tablet, Take  45 mg by mouth daily., Disp: , Rfl:  .  pravastatin (PRAVACHOL) 40 MG tablet, Take 40 mg by mouth every evening. , Disp: , Rfl:  .  Prenatal Vit-Fe Fumarate-FA (PRENATAL FA PO), Take 1 tablet by mouth daily., Disp: , Rfl:  .  quinapril (ACCUPRIL) 40 MG tablet, Take 40 mg by mouth daily. , Disp: , Rfl:  .  silver sulfADIAZINE (SILVADENE) 1 % cream, Apply 1 application topically daily., Disp: 50 g, Rfl: 0 .  warfarin (COUMADIN) 5 MG tablet, Take 5-7.5 mg by mouth See admin instructions. Take 5 mg in the evening on Sun, Mon, Wed, and Fri. Take 7.5 mg in the evening on Tue, Thur, and Sat (Patient not taking: Reported on 04/02/2020), Disp: , Rfl:  .  Wheat Dextrin (BENEFIBER) POWD, Take 15 mLs by mouth daily as needed (constipation). Mix in coffee and  drink, Disp: , Rfl:   Social History   Tobacco Use  Smoking Status Never Smoker  Smokeless Tobacco Never Used    Allergies  Allergen Reactions  . Tape     Peels skin on legs   Objective:  There were no vitals filed for this visit. There is no height or weight on file to calculate BMI. Constitutional Well developed. Well nourished.  Vascular Foot warm and well perfused. Capillary refill normal to all digits.   Neurologic Normal speech. Oriented to person, place, and time. Epicritic sensation to light touch grossly present bilaterally.  Dermatologic Skin healing well without signs of infection. Skin edges well coapted without signs of infection.  Orthopedic: Tenderness to palpation noted about the surgical site.   Radiographs: None Assessment:   1. Diabetic polyneuropathy associated with type 2 diabetes mellitus ([place])   2. Diabetic ulcer of toe of left foot associated with type 2 diabetes mellitus, with fat layer exposed ([place])   3. Status post amputation of great toe, unspecified laterality ([place])    Plan:  Patient was evaluated and treated and all questions answered.  S/p foot surgery bilaterally -Progressing as expected post-operatively. -XR: None -WB Status: Weightbearing as tolerated in surgical shoe bilateral -Sutures: Intact.  Mild signs of dehiscence noted to the right side.  No dehiscence noted to the left side.  No clinical signs of infection noted bilaterally. -Medications: None -Foot redressed.  No follow-ups on file.

## 2020-04-18 DIAGNOSIS — Z7901 Long term (current) use of anticoagulants: Secondary | ICD-10-CM | POA: Diagnosis not present

## 2020-04-18 DIAGNOSIS — Z86711 Personal history of pulmonary embolism: Secondary | ICD-10-CM | POA: Diagnosis not present

## 2020-04-20 DIAGNOSIS — G4733 Obstructive sleep apnea (adult) (pediatric): Secondary | ICD-10-CM | POA: Diagnosis not present

## 2020-04-23 ENCOUNTER — Other Ambulatory Visit: Payer: Self-pay

## 2020-04-23 ENCOUNTER — Encounter: Payer: Self-pay | Admitting: Podiatry

## 2020-04-23 ENCOUNTER — Ambulatory Visit (INDEPENDENT_AMBULATORY_CARE_PROVIDER_SITE_OTHER): Payer: PPO | Admitting: Podiatry

## 2020-04-23 DIAGNOSIS — L97522 Non-pressure chronic ulcer of other part of left foot with fat layer exposed: Secondary | ICD-10-CM | POA: Diagnosis not present

## 2020-04-23 DIAGNOSIS — E11621 Type 2 diabetes mellitus with foot ulcer: Secondary | ICD-10-CM

## 2020-04-23 DIAGNOSIS — Z89419 Acquired absence of unspecified great toe: Secondary | ICD-10-CM

## 2020-04-23 DIAGNOSIS — E1142 Type 2 diabetes mellitus with diabetic polyneuropathy: Secondary | ICD-10-CM | POA: Diagnosis not present

## 2020-04-23 NOTE — Progress Notes (Addendum)
Subjective:  Patient ID: Joseph Austad., male    DOB: 11-29-1955,  MRN: [id number]  Chief Complaint  Patient presents with  . Routine Post Op    DOS: 7/10/2021DOS:     DOS: 04/05/2020 Procedure: Bilateral partial hallux amputation  64 y.o. male returns for post-op check.  Patient states he is doing well.  He denies any other acute complaints.  He finished his antibiotic course.  His dressing is clean dry and intact.  No clinical signs of infection noted.  Review of Systems: Negative except as noted in the HPI. Denies N/V/F/Ch.  Past Medical History:  Diagnosis Date  . Anxiety state, unspecified   . Depression   . DVT (deep venous thrombosis) ([place]) 2004   Left knee  . Edema   . Esophageal reflux    occ  . Essential hypertension, benign   . Essential hypertension, benign   . Falls frequently   . History of iron deficiency anemia   . Morbid obesity ([place])   . Myalgia and myositis, unspecified    BACK AND LEGS  . OSA on CPAP   . Peripheral vascular disease, unspecified ([place])   . Personal history of PE (pulmonary embolism) 2004  . Polyneuropathy in diabetes(357.2)   . Poor venous access    PT STATES DIFFICULT TO DRAW BLOOD FROM HIS VEINS  . Pure hypercholesterolemia   . Shortness of breath    DUE TO WEIGHT AND LOSS OF MOBILITY  . Type II or unspecified type diabetes mellitus with neurological manifestations, not stated as uncontrolled(250.60)   . Type II or unspecified type diabetes mellitus without mention of complication, not stated as uncontrolled   . 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 Varicose veins of lower extremities with inflammation   . Wears glasses   . Wound infection after surgery    required hospitalization    Current Outpatient Medications:  .  acetaminophen (TYLENOL) 500 MG tablet, Take 1,000 mg by mouth daily as needed for moderate pain or headache. , Disp: , Rfl:  .  amLODipine (NORVASC) 10  MG tablet, Take 10 mg by mouth every evening. , Disp: , Rfl:  .  buPROPion (WELLBUTRIN XL) 150 MG 24 hr tablet, Take 150 mg by mouth daily., Disp: , Rfl: 1 .  Continuous Blood Gluc Sensor (FREESTYLE LIBRE 2 SENSOR) MISC, APPLY EVERY 14 DAYS AS DIRECTED, Disp: , Rfl:  .  FLUoxetine (PROZAC) 40 MG capsule, Take 40 mg by mouth every evening. , Disp: , Rfl:  .  FREESTYLE LITE test strip, TEST BLOOD SUGAR FOUR TIMES DAILY, Disp: , Rfl:  .  furosemide (LASIX) 40 MG tablet, Take 40 mg by mouth daily. , Disp: , Rfl:  .  indapamide (LOZOL) 2.5 MG tablet, Take 2.5 mg by mouth 2 (two) times daily. , Disp: , Rfl:  .  insulin aspart (NOVOLOG) 100 UNIT/ML injection, Inject 5-20 Units into the skin with breakfast, with lunch, and with evening meal. Sliding scale per meals, Disp: , Rfl:  .  insulin NPH Human (NOVOLIN N) 100 UNIT/ML injection, Inject 30 Units into the skin 2 (two) times daily. , Disp: , Rfl:  .  metFORMIN (GLUCOPHAGE) 1000 MG tablet, Take 1,000 mg by mouth 2 (two) times daily with a meal., Disp: , Rfl:  .  omeprazole (PRILOSEC) 40 MG capsule, Take 40 mg by mouth daily as needed (acid reflux). , Disp: , Rfl:  .  oxyCODONE (OXY IR/ROXICODONE) 5  MG immediate release tablet, Take 1 tablet (5 mg total) by mouth every 8 (eight) hours as needed for severe pain., Disp: 30 tablet, Rfl: 0 .  pioglitazone (ACTOS) 45 MG tablet, Take 45 mg by mouth daily., Disp: , Rfl:  .  pravastatin (PRAVACHOL) 40 MG tablet, Take 40 mg by mouth every evening. , Disp: , Rfl:  .  Prenatal Vit-Fe Fumarate-FA (PRENATAL FA PO), Take 1 tablet by mouth daily., Disp: , Rfl:  .  quinapril (ACCUPRIL) 40 MG tablet, Take 40 mg by mouth daily. , Disp: , Rfl:  .  silver sulfADIAZINE (SILVADENE) 1 % cream, Apply 1 application topically daily., Disp: 50 g, Rfl: 0 .  warfarin (COUMADIN) 5 MG tablet, Take 5-7.5 mg by mouth See admin instructions. Take 5 mg in the evening on Sun, Mon, Wed, and Fri. Take 7.5 mg in the evening on Tue, Thur, and  Sat, Disp: , Rfl:  .  Wheat Dextrin (BENEFIBER) POWD, Take 15 mLs by mouth daily as needed (constipation). Mix in coffee and drink, Disp: , Rfl:   Social History   Tobacco Use  Smoking Status Never Smoker  Smokeless Tobacco Never Used    Allergies  Allergen Reactions  . Tape     Peels skin on legs   Objective:  There were no vitals filed for this visit. There is no height or weight on file to calculate BMI. Constitutional Well developed. Well nourished.  Vascular Foot warm and well perfused. Capillary refill normal to all digits.   Neurologic Normal speech. Oriented to person, place, and time. Epicritic sensation to light touch grossly present bilaterally.  Dermatologic Skin healing well without signs of infection. Skin edges well coapted without signs of infection.  Orthopedic: Tenderness to palpation noted about the surgical site.   Radiographs: None Assessment:   1. Diabetic polyneuropathy associated with type 2 diabetes mellitus ([place])   2. Status post amputation of great toe, unspecified laterality ([place])   3. Diabetic ulcer of toe of left foot associated with type 2 diabetes mellitus, with fat layer exposed ([place])    Plan:  Patient was evaluated and treated and all questions answered.  S/p foot surgery bilaterally -Progressing as expected post-operatively. -XR: None -WB Status: Weightbearing as tolerated in surgical shoe bilateral -Sutures: Intact.  Mild signs of dehiscence noted to the right side.  No dehiscence noted to the left side.  No clinical signs of infection noted bilaterally.  I would take the stitches out during next clinical visit. -Medications: None -Foot redressed. -Patient would like to hold off on getting diabetic shoes with filler as he does not utilize his feet and is mostly nonweightbearing at home.  I agreed with him.  If he develops any kind of frictional blister then we will get them at that time.  No follow-ups on file.

## 2020-05-02 ENCOUNTER — Encounter: Payer: PPO | Admitting: Podiatry

## 2020-05-07 ENCOUNTER — Ambulatory Visit (INDEPENDENT_AMBULATORY_CARE_PROVIDER_SITE_OTHER): Payer: PPO | Admitting: Podiatry

## 2020-05-07 ENCOUNTER — Other Ambulatory Visit: Payer: Self-pay

## 2020-05-07 DIAGNOSIS — Z89419 Acquired absence of unspecified great toe: Secondary | ICD-10-CM

## 2020-05-07 DIAGNOSIS — M205X1 Other deformities of toe(s) (acquired), right foot: Secondary | ICD-10-CM | POA: Diagnosis not present

## 2020-05-07 DIAGNOSIS — E1142 Type 2 diabetes mellitus with diabetic polyneuropathy: Secondary | ICD-10-CM

## 2020-05-08 ENCOUNTER — Encounter: Payer: Self-pay | Admitting: Podiatry

## 2020-05-08 NOTE — Progress Notes (Signed)
Subjective:  Patient ID: Joseph [name]., male    DOB: 24-Jun-1956,  MRN: [id number]  Chief Complaint  Patient presents with  . Routine Post Op    DOS: 04/05/2020 Bilateral partial hallux amputation    DOS: 04/05/2020 Procedure: Bilateral partial hallux amputation  64 y.o. male returns for post-op check.  Patient states he is doing well.  He denies any other acute complaints.  He finished his antibiotic course.  His dressing is clean dry and intact.  No clinical signs of infection noted.  Patient also has secondary complaint of right hammertoe contracture of the second digit.  Patient states is painful to the tip of the toe with a callus formation.  He would like to undergo flexor tenotomy that I previously has discussed in the past.  He denies any other acute complaints.  Review of Systems: Negative except as noted in the HPI. Denies N/V/F/Ch.  Past Medical History:  Diagnosis Date  . Anxiety state, unspecified   . Depression   . DVT (deep venous thrombosis) ([place]) 2004   Left knee  . Edema   . Esophageal reflux    occ  . Essential hypertension, benign   . Essential hypertension, benign   . Falls frequently   . History of iron deficiency anemia   . Morbid obesity ([place])   . Myalgia and myositis, unspecified    BACK AND LEGS  . OSA on CPAP   . Peripheral vascular disease, unspecified ([place])   . Personal history of PE (pulmonary embolism) 2004  . Polyneuropathy in diabetes(357.2)   . Poor venous access    PT STATES DIFFICULT TO DRAW BLOOD FROM HIS VEINS  . Pure hypercholesterolemia   . Shortness of breath    DUE TO WEIGHT AND LOSS OF MOBILITY  . Type II or unspecified type diabetes mellitus with neurological manifestations, not stated as uncontrolled(250.60)   . Type II or unspecified type diabetes mellitus without mention of complication, not stated as uncontrolled   . 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 Varicose  veins of lower extremities with inflammation   . Wears glasses   . Wound infection after surgery    required hospitalization    Current Outpatient Medications:  .  acetaminophen (TYLENOL) 500 MG tablet, Take 1,000 mg by mouth daily as needed for moderate pain or headache. , Disp: , Rfl:  .  amLODipine (NORVASC) 10 MG tablet, Take 10 mg by mouth every evening. , Disp: , Rfl:  .  buPROPion (WELLBUTRIN XL) 150 MG 24 hr tablet, Take 150 mg by mouth daily., Disp: , Rfl: 1 .  Continuous Blood Gluc Sensor (FREESTYLE LIBRE 2 SENSOR) MISC, APPLY EVERY 14 DAYS AS DIRECTED, Disp: , Rfl:  .  FLUoxetine (PROZAC) 40 MG capsule, Take 40 mg by mouth every evening. , Disp: , Rfl:  .  FREESTYLE LITE test strip, TEST BLOOD SUGAR FOUR TIMES DAILY, Disp: , Rfl:  .  furosemide (LASIX) 40 MG tablet, Take 40 mg by mouth daily. , Disp: , Rfl:  .  indapamide (LOZOL) 2.5 MG tablet, Take 2.5 mg by mouth 2 (two) times daily. , Disp: , Rfl:  .  insulin aspart (NOVOLOG) 100 UNIT/ML injection, Inject 5-20 Units into the skin with breakfast, with lunch, and with evening meal. Sliding scale per meals, Disp: , Rfl:  .  insulin NPH Human (NOVOLIN N) 100 UNIT/ML injection, Inject 30 Units into the skin 2 (two) times daily. ,  Disp: , Rfl:  .  metFORMIN (GLUCOPHAGE) 1000 MG tablet, Take 1,000 mg by mouth 2 (two) times daily with a meal., Disp: , Rfl:  .  omeprazole (PRILOSEC) 40 MG capsule, Take 40 mg by mouth daily as needed (acid reflux). , Disp: , Rfl:  .  oxyCODONE (OXY IR/ROXICODONE) 5 MG immediate release tablet, Take 1 tablet (5 mg total) by mouth every 8 (eight) hours as needed for severe pain., Disp: 30 tablet, Rfl: 0 .  pioglitazone (ACTOS) 45 MG tablet, Take 45 mg by mouth daily., Disp: , Rfl:  .  pravastatin (PRAVACHOL) 40 MG tablet, Take 40 mg by mouth every evening. , Disp: , Rfl:  .  Prenatal Vit-Fe Fumarate-FA (PRENATAL FA PO), Take 1 tablet by mouth daily., Disp: , Rfl:  .  quinapril (ACCUPRIL) 40 MG tablet, Take  40 mg by mouth daily. , Disp: , Rfl:  .  silver sulfADIAZINE (SILVADENE) 1 % cream, Apply 1 application topically daily., Disp: 50 g, Rfl: 0 .  warfarin (COUMADIN) 5 MG tablet, Take 5-7.5 mg by mouth See admin instructions. Take 5 mg in the evening on Sun, Mon, Wed, and Fri. Take 7.5 mg in the evening on Tue, Thur, and Sat, Disp: , Rfl:  .  Wheat Dextrin (BENEFIBER) POWD, Take 15 mLs by mouth daily as needed (constipation). Mix in coffee and drink, Disp: , Rfl:   Social History   Tobacco Use  Smoking Status Never Smoker  Smokeless Tobacco Never Used    Allergies  Allergen Reactions  . Tape     Peels skin on legs   Objective:  There were no vitals filed for this visit. There is no height or weight on file to calculate BMI. Constitutional Well developed. Well nourished.  Vascular Foot warm and well perfused. Capillary refill normal to all digits.   Neurologic Normal speech. Oriented to person, place, and time. Epicritic sensation to light touch grossly present bilaterally.  Dermatologic Skin healing well without signs of infection. Skin edges well coapted without signs of infection.  Pre-ulcerative callus at the tip of the right second digit  Semi-reducible hammertoe deformity right, 2nd toe     Orthopedic: Tenderness to palpation noted about the surgical site.   Radiographs: None Assessment:   1. Diabetic polyneuropathy associated with type 2 diabetes mellitus ([place])   2. Status post amputation of great toe, unspecified laterality ([place])   3. Toe contracture, right    Plan:  Patient was evaluated and treated and all questions answered.  S/p foot surgery bilaterally -Progressing as expected post-operatively. -XR: None -WB Status: Weightbearing as tolerated in surgical shoe bilateral -Sutures: Stitches were removed.  No dehiscence noted. -Medications: None -Foot redressed. -Patient might be interested in getting diabetic shoes with filler as he previously was not wanting  to get it.  Hammertoe second digit right with pre-ulcerative callus -Flexor tenotomy as below. -Advised to remove the dressing in 24 hours and apply a band-aid and triple abx ointment every day thereafter.  Procedure: Flexor Tenotomy Indication for Procedure: toe with semi-reducible hammertoe with distal tip ulceration. Flexor tenotomy indicated to alleviate contracture, reduce pressure, and enhance healing of the ulceration. Location: right, 2nd toe Anesthesia: Lidocaine 1% plain; 1.5 mL and Marcaine 0.5% plain; 1.5 mL digital block Instrumentation: 11 blade Technique: The toe was anesthetized as above and prepped in the usual fashion. The toe was exsanquinated and a tourniquet was secured at the base of the toe.  An 11 blade  was then used to  percutaneously release the flexor tendon at the plantar surface of the toe with noted release of the hammertoe deformity.  The incision was closed with 3-0 nylon.  Compression splint dressing applied. Patient tolerated the procedure well. Dressing: Dry, sterile, compression dressing. Disposition: Patient tolerated procedure well. Patient to return in 1 week for follow-up.      Return in about 2 weeks (around 05/21/2020) for for flexor tenotomy f/u.     Return in about 2 weeks (around 05/21/2020) for for flexor tenotomy f/u.

## 2020-05-16 DIAGNOSIS — Z86711 Personal history of pulmonary embolism: Secondary | ICD-10-CM | POA: Diagnosis not present

## 2020-05-16 DIAGNOSIS — Z7901 Long term (current) use of anticoagulants: Secondary | ICD-10-CM | POA: Diagnosis not present

## 2020-05-21 ENCOUNTER — Ambulatory Visit (INDEPENDENT_AMBULATORY_CARE_PROVIDER_SITE_OTHER): Payer: PPO | Admitting: Podiatry

## 2020-05-21 ENCOUNTER — Other Ambulatory Visit: Payer: Self-pay

## 2020-05-21 DIAGNOSIS — M205X1 Other deformities of toe(s) (acquired), right foot: Secondary | ICD-10-CM

## 2020-05-21 DIAGNOSIS — L97522 Non-pressure chronic ulcer of other part of left foot with fat layer exposed: Secondary | ICD-10-CM

## 2020-05-21 DIAGNOSIS — E11621 Type 2 diabetes mellitus with foot ulcer: Secondary | ICD-10-CM

## 2020-05-21 DIAGNOSIS — E1142 Type 2 diabetes mellitus with diabetic polyneuropathy: Secondary | ICD-10-CM

## 2020-05-21 DIAGNOSIS — G4733 Obstructive sleep apnea (adult) (pediatric): Secondary | ICD-10-CM | POA: Diagnosis not present

## 2020-05-23 ENCOUNTER — Encounter: Payer: Self-pay | Admitting: Podiatry

## 2020-05-23 NOTE — Progress Notes (Signed)
Subjective:  Patient ID: Joseph [name]., male    DOB: 01-24-1956,  MRN: [id number]  Chief Complaint  Patient presents with  . Routine Post Op    DOS 7.10.2021 Pt states concern with small wound on top of 2nd digit. Denies fever/chills/nausea/vomiting.    DOS: 04/05/2020 Procedure: Bilateral partial hallux amputation  64 y.o. male returns for post-op check. Patient presents with follow-up of bilateral hallux amputation. Patient has completely reepithelialized. They have been doing dressing changes regularly. Patient is also following up status post flexor tenotomy. Patient states his pain is completely gone. He denies any other acute complaints.  Review of Systems: Negative except as noted in the HPI. Denies N/V/F/Ch.  Past Medical History:  Diagnosis Date  . Anxiety state, unspecified   . Depression   . DVT (deep venous thrombosis) ([place]) 2004   Left knee  . Edema   . Esophageal reflux    occ  . Essential hypertension, benign   . Essential hypertension, benign   . Falls frequently   . History of iron deficiency anemia   . Morbid obesity ([place])   . Myalgia and myositis, unspecified    BACK AND LEGS  . OSA on CPAP   . Peripheral vascular disease, unspecified ([place])   . Personal history of PE (pulmonary embolism) 2004  . Polyneuropathy in diabetes(357.2)   . Poor venous access    PT STATES DIFFICULT TO DRAW BLOOD FROM HIS VEINS  . Pure hypercholesterolemia   . Shortness of breath    DUE TO WEIGHT AND LOSS OF MOBILITY  . Type II or unspecified type diabetes mellitus with neurological manifestations, not stated as uncontrolled(250.60)   . Type II or unspecified type diabetes mellitus without mention of complication, not stated as uncontrolled   . 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 Varicose veins of lower extremities with inflammation   . Wears glasses   . Wound infection after surgery    required hospitalization     Current Outpatient Medications:  .  acetaminophen (TYLENOL) 500 MG tablet, Take 1,000 mg by mouth daily as needed for moderate pain or headache. , Disp: , Rfl:  .  amLODipine (NORVASC) 10 MG tablet, Take 10 mg by mouth every evening. , Disp: , Rfl:  .  buPROPion (WELLBUTRIN XL) 150 MG 24 hr tablet, Take 150 mg by mouth daily., Disp: , Rfl: 1 .  Continuous Blood Gluc Sensor (FREESTYLE LIBRE 2 SENSOR) MISC, APPLY EVERY 14 DAYS AS DIRECTED, Disp: , Rfl:  .  FLUoxetine (PROZAC) 40 MG capsule, Take 40 mg by mouth every evening. , Disp: , Rfl:  .  FREESTYLE LITE test strip, TEST BLOOD SUGAR FOUR TIMES DAILY, Disp: , Rfl:  .  furosemide (LASIX) 40 MG tablet, Take 40 mg by mouth daily. , Disp: , Rfl:  .  indapamide (LOZOL) 2.5 MG tablet, Take 2.5 mg by mouth 2 (two) times daily. , Disp: , Rfl:  .  insulin aspart (NOVOLOG) 100 UNIT/ML injection, Inject 5-20 Units into the skin with breakfast, with lunch, and with evening meal. Sliding scale per meals, Disp: , Rfl:  .  insulin NPH Human (NOVOLIN N) 100 UNIT/ML injection, Inject 30 Units into the skin 2 (two) times daily. , Disp: , Rfl:  .  metFORMIN (GLUCOPHAGE) 1000 MG tablet, Take 1,000 mg by mouth 2 (two) times daily with a meal., Disp: , Rfl:  .  omeprazole (PRILOSEC) 40 MG capsule, Take 40 mg  by mouth daily as needed (acid reflux). , Disp: , Rfl:  .  oxyCODONE (OXY IR/ROXICODONE) 5 MG immediate release tablet, Take 1 tablet (5 mg total) by mouth every 8 (eight) hours as needed for severe pain., Disp: 30 tablet, Rfl: 0 .  pioglitazone (ACTOS) 45 MG tablet, Take 45 mg by mouth daily., Disp: , Rfl:  .  pravastatin (PRAVACHOL) 40 MG tablet, Take 40 mg by mouth every evening. , Disp: , Rfl:  .  Prenatal Vit-Fe Fumarate-FA (PRENATAL FA PO), Take 1 tablet by mouth daily., Disp: , Rfl:  .  quinapril (ACCUPRIL) 40 MG tablet, Take 40 mg by mouth daily. , Disp: , Rfl:  .  silver sulfADIAZINE (SILVADENE) 1 % cream, Apply 1 application topically daily., Disp:  50 g, Rfl: 0 .  warfarin (COUMADIN) 5 MG tablet, Take 5-7.5 mg by mouth See admin instructions. Take 5 mg in the evening on Sun, Mon, Wed, and Fri. Take 7.5 mg in the evening on Tue, Thur, and Sat, Disp: , Rfl:  .  Wheat Dextrin (BENEFIBER) POWD, Take 15 mLs by mouth daily as needed (constipation). Mix in coffee and drink, Disp: , Rfl:   Social History   Tobacco Use  Smoking Status Never Smoker  Smokeless Tobacco Never Used    Allergies  Allergen Reactions  . Tape     Peels skin on legs   Objective:  There were no vitals filed for this visit. There is no height or weight on file to calculate BMI. Constitutional Well developed. Well nourished.  Vascular Foot warm and well perfused. Capillary refill normal to all digits.   Neurologic Normal speech. Oriented to person, place, and time. Epicritic sensation to light touch grossly present bilaterally.  Dermatologic  amputation site completely reepithelialized without any acute dehiscence. No clinical signs of infection noted..  Pre-ulcerative callus at the tip of the right second digit  Semi-reducible hammertoe deformity right, 2nd toe     Orthopedic:  No tenderness to palpation noted about the surgical site.   Radiographs: None Assessment:   1. Toe contracture, right   2. Diabetic polyneuropathy associated with type 2 diabetes mellitus ([place])   3. Diabetic ulcer of toe of left foot associated with type 2 diabetes mellitus, with fat layer exposed ([place])    Plan:  Patient was evaluated and treated and all questions answered.  S/p foot surgery bilaterally -Clinically both amputation at sites have reepithelialized. At this time I discussed with the patient given that he has partial hallux amputation will benefit from diabetic insoles with toe filler. Patient agrees with the plan would like to proceed with obtaining diabetic insoles. -He will be scheduled see Liliane Channel for diabetic insoles with filler. Until then I encouraged him to  continue using surgical shoe. Patient states understanding of that he will.  Hammertoe second digit right with pre-ulcerative callus -Clinically healed. Good correction alignment noted. Patient's pain completely resolved.      No follow-ups on file.     No follow-ups on file.

## 2020-06-13 DIAGNOSIS — Z86711 Personal history of pulmonary embolism: Secondary | ICD-10-CM | POA: Diagnosis not present

## 2020-06-13 DIAGNOSIS — Z7901 Long term (current) use of anticoagulants: Secondary | ICD-10-CM | POA: Diagnosis not present

## 2020-06-17 ENCOUNTER — Other Ambulatory Visit: Payer: PPO | Admitting: Orthotics

## 2020-06-17 NOTE — Progress Notes (Signed)
RODGERS, LIKES ([id number]) Visit Report for 03/28/2020 Arrival Information Details Patient Name: Date of Service: Joseph [name], Joseph [place] C. 03/28/2020 1:30 PM Medical Record Number: [id number] Patient Account Number: [id number] Date of Birth/Sex: Treating RN: [birth year] (64 y.o. Joseph [name] Primary Care Monserrath Junio: Lynne Logan Other Clinician: Referring Makita Blow: Treating Jesus Nevills/Extender: Lorenza Evangelist in Treatment: 2 Visit Information History Since Last Visit Added or deleted any medications: No Patient Arrived: Joseph [name] Any new allergies or adverse reactions: No Arrival Time: 13:46 Had a fall or experienced change in No Accompanied By: wife activities of daily living that may affect Transfer Assistance: None risk of falls: Patient Identification Verified: Yes Signs or symptoms of abuse/neglect since last visito No Secondary Verification Process Completed: Yes Hospitalized since last visit: No Patient Requires Transmission-Based Precautions: No Implantable device outside of the clinic excluding No Patient Has Alerts: Yes cellular tissue based products placed in the center Patient Alerts: Patient on Blood Thinner since last visit: Has Dressing in Place as Prescribed: Yes Pain Present Now: No Electronic Signature(s) Signed: 06/17/2020 8:09:04 AM By: Sandre Kitty Entered By: Sandre Kitty on 03/28/2020 13:47:08 -------------------------------------------------------------------------------- Clinic Level of Care Assessment Details Patient Name: Date of Service: Joseph [name], Joseph UL C. 03/28/2020 1:30 PM Medical Record Number: [id number] Patient Account Number: [id number] Date of Birth/Sex: Treating RN: 1955/12/22 (64 y.o. Joseph [name] Primary Care Rhonna Holster: Lynne Logan Other Clinician: Referring Theodoros Stjames: Treating Yuliza Cara/Extender: Lorenza Evangelist in Treatment: 2 Clinic Level of Care Assessment Items TOOL 4 Quantity Score X- 1  0 Use when only an EandM is performed on FOLLOW-UP visit ASSESSMENTS - Nursing Assessment / Reassessment X- 1 10 Reassessment of Co-morbidities (includes updates in patient status) X- 1 5 Reassessment of Adherence to Treatment Plan ASSESSMENTS - Wound and Skin A ssessment / Reassessment []  - 0 Simple Wound Assessment / Reassessment - one wound X- 2 5 Complex Wound Assessment / Reassessment - multiple wounds []  - 0 Dermatologic / Skin Assessment (not related to wound area) ASSESSMENTS - Focused Assessment X- 2 5 Circumferential Edema Measurements - multi extremities []  - 0 Nutritional Assessment / Counseling / Intervention []  - 0 Lower Extremity Assessment (monofilament, tuning fork, pulses) []  - 0 Peripheral Arterial Disease Assessment (using hand held doppler) ASSESSMENTS - Ostomy and/or Continence Assessment and Care []  - 0 Incontinence Assessment and Management []  - 0 Ostomy Care Assessment and Management (repouching, etc.) PROCESS - Coordination of Care []  - 0 Simple Patient / Family Education for ongoing care X- 1 20 Complex (extensive) Patient / Family Education for ongoing care X- 1 10 Staff obtains Programmer, systems, Records, T Results / Process Orders est []  - 0 Staff telephones HHA, Nursing Homes / Clarify orders / etc []  - 0 Routine Transfer to another Facility (non-emergent condition) []  - 0 Routine Hospital Admission (non-emergent condition) []  - 0 New Admissions / Biomedical engineer / Ordering NPWT Apligraf, etc. , []  - 0 Emergency Hospital Admission (emergent condition) X- 1 10 Simple Discharge Coordination X- 1 15 Complex (extensive) Discharge Coordination PROCESS - Special Needs []  - 0 Pediatric / Minor Patient Management []  - 0 Isolation Patient Management []  - 0 Hearing / Language / Visual special needs []  - 0 Assessment of Community assistance (transportation, D/C planning, etc.) []  - 0 Additional assistance / Altered mentation []  -  0 Support Surface(s) Assessment (bed, cushion, seat, etc.) INTERVENTIONS - Wound Cleansing / Measurement []  - 0 Simple Wound Cleansing - one wound X- 2  5 Complex Wound Cleansing - multiple wounds X- 1 5 Wound Imaging (photographs - any number of wounds) []  - 0 Wound Tracing (instead of photographs) []  - 0 Simple Wound Measurement - one wound X- 2 5 Complex Wound Measurement - multiple wounds INTERVENTIONS - Wound Dressings X - Small Wound Dressing one or multiple wounds 2 10 []  - 0 Medium Wound Dressing one or multiple wounds []  - 0 Large Wound Dressing one or multiple wounds X- 1 5 Application of Medications - topical []  - 0 Application of Medications - injection INTERVENTIONS - Miscellaneous []  - 0 External ear exam []  - 0 Specimen Collection (cultures, biopsies, blood, body fluids, etc.) []  - 0 Specimen(s) / Culture(s) sent or taken to Lab for analysis []  - 0 Patient Transfer (multiple staff / Civil Service fast streamer / Similar devices) []  - 0 Simple Staple / Suture removal (25 or less) []  - 0 Complex Staple / Suture removal (26 or more) []  - 0 Hypo / Hyperglycemic Management (close monitor of Blood Glucose) []  - 0 Ankle / Brachial Index (ABI) - do not check if billed separately X- 1 5 Vital Signs Has the patient been seen at the hospital within the last three years: Yes Total Score: 145 Level Of Care: New/Established - Level 4 Electronic Signature(s) Signed: 03/28/2020 4:13:30 PM By: Kela Millin Entered By: Kela Millin on 03/28/2020 15:04:32 -------------------------------------------------------------------------------- Encounter Discharge Information Details Patient Name: Date of Service: Joseph Fitz, PA UL C. 03/28/2020 1:30 PM Medical Record Number: [id number] Patient Account Number: [id number] Date of Birth/Sex: Treating RN: Nov 14, 1955 (64 y.o. Ernestene Mention Primary Care Marcheta Horsey: Lynne Logan Other Clinician: Referring Tyronza Happe: Treating  Tyshaun Vinzant/Extender: Lorenza Evangelist in Treatment: 2 Encounter Discharge Information Items Discharge Condition: Stable Ambulatory Status: Walker Discharge Destination: Home Transportation: Private Auto Accompanied By: spouse Schedule Follow-up Appointment: Yes Clinical Summary of Care: Patient Declined Electronic Signature(s) Signed: 03/28/2020 3:59:35 PM By: Baruch Gouty RN, BSN Entered By: Baruch Gouty on 03/28/2020 15:20:17 -------------------------------------------------------------------------------- Lower Extremity Assessment Details Patient Name: Date of Service: Joseph Fitz, PA UL C. 03/28/2020 1:30 PM Medical Record Number: [id number] Patient Account Number: [id number] Date of Birth/Sex: Treating RN: October 29, 1955 (64 y.o. Oval Linsey Primary Care Bergen Melle: Lynne Logan Other Clinician: Referring Creston Klas: Treating Cohen Doleman/Extender: Lawerance Cruel Weeks in Treatment: 2 Edema Assessment Assessed: [Left: No] [Right: No] Edema: [Left: No] [Right: No] Calf Left: Right: Point of Measurement: 41 cm From Medial Instep 46.3 cm 43 cm Ankle Left: Right: Point of Measurement: 9 cm From Medial Instep 24 cm 25 cm Electronic Signature(s) Signed: 03/28/2020 3:42:07 PM By: Carlene Coria RN Entered By: Carlene Coria on 03/28/2020 14:12:12 -------------------------------------------------------------------------------- Multi Wound Chart Details Patient Name: Date of Service: Joseph Fitz, PA UL C. 03/28/2020 1:30 PM Medical Record Number: [id number] Patient Account Number: [id number] Date of Birth/Sex: Treating RN: [birth year] (64 y.o. Joseph [name] Primary Care Chrisandra Wiemers: Lynne Logan Other Clinician: Referring Jovante Hammitt: Treating Lorenzo Pereyra/Extender: Lawerance Cruel Weeks in Treatment: 2 Vital Signs Height(in): 9 Capillary Blood Glucose(mg/dl): 63 Weight(lbs): 470 Pulse(bpm): 82 Body Mass Index(BMI): 37 Blood Pressure(mmHg):  187/66 Temperature(F): 98.5 Respiratory Rate(breaths/min): 20 Photos: [4:No Photos Right T Great oe] [5:No Photos Left T Great oe] [N/A:N/A N/A] Wound Location: [4:Gradually Appeared] [5:Gradually Appeared] [N/A:N/A] Wounding Event: [4:Diabetic Wound/Ulcer of the Lower] [5:Diabetic Wound/Ulcer of the Lower] [N/A:N/A] Primary Etiology: [4:Extremity Cataracts, Sleep Apnea, Arrhythmia, Cataracts, Sleep Apnea, Arrhythmia, N/A] [5:Extremity] Comorbid History: [4:Hypertension, Peripheral Venous Disease, Type II Diabetes, Neuropathy 02/15/2020] [  5:Hypertension, Peripheral Venous Disease, Type II Diabetes, Neuropathy 02/19/2020] [N/A:N/A] Date Acquired: [4:2] [5:2] [N/A:N/A] Weeks of Treatment: [4:Open] [5:Open] [N/A:N/A] Wound Status: [4:2x2.4x0.1] [5:1.3x1.4x0.1] [N/A:N/A] Measurements L x W x D (cm) [4:3.77] [5:1.429] [N/A:N/A] A (cm) : rea [4:0.377] [5:0.143] [N/A:N/A] Volume (cm) : [4:12.70%] [5:-127.50%] [N/A:N/A] % Reduction in A rea: [4:12.70%] [5:-127.00%] [N/A:N/A] % Reduction in Volume: [4:Grade 2] [5:Grade 2] [N/A:N/A] Classification: [4:Medium] [5:Medium] [N/A:N/A] Exudate A mount: [4:Serosanguineous] [5:Serosanguineous] [N/A:N/A] Exudate Type: [4:red, brown] [5:red, brown] [N/A:N/A] Exudate Color: [4:Distinct, outline attached] [5:Distinct, outline attached] [N/A:N/A] Wound Margin: [4:Small (1-33%)] [5:Medium (34-66%)] [N/A:N/A] Granulation A mount: [4:Pink] [5:Pink] [N/A:N/A] Granulation Quality: [4:Large (67-100%)] [5:Medium (34-66%)] [N/A:N/A] Necrotic A mount: [4:Eschar, Adherent Slough] [5:Adherent Slough] [N/A:N/A] Necrotic Tissue: [4:Fat Layer (Subcutaneous Tissue): Yes Fat Layer (Subcutaneous Tissue): Yes N/A] Exposed Structures: [4:Fascia: No Tendon: No Muscle: No Joint: No Bone: No None] [5:Fascia: No Tendon: No Muscle: No Joint: No Bone: No None] [N/A:N/A] Treatment Notes Wound #4 (Right Toe Great) 3. Primary Dressing Applied Calcium Alginate Ag 4. Secondary  Dressing Dry Gauze Roll Gauze Wound #5 (Left Toe Great) 3. Primary Dressing Applied Calcium Alginate Ag 4. Secondary Dressing Dry Gauze Roll Gauze Electronic Signature(s) Signed: 03/28/2020 5:03:02 PM By: Linton Ham MD Signed: 04/01/2020 5:18:40 PM By: Kela Millin Entered By: Linton Ham on 03/28/2020 16:47:06 -------------------------------------------------------------------------------- Multi-Disciplinary Care Plan Details Patient Name: Date of Service: Joseph Fitz, PA UL C. 03/28/2020 1:30 PM Medical Record Number: [id number] Patient Account Number: [id number] Date of Birth/Sex: Treating RN: [birth year] (64 y.o. Joseph [name] Primary Care Melissa Pulido: Lynne Logan Other Clinician: Referring Carnisha Feltz: Treating Danh Bayus/Extender: Lorenza Evangelist in Treatment: 2 Active Inactive Electronic Signature(s) Signed: 06/16/2020 1:34:18 PM By: Kela Millin Previous Signature: 03/28/2020 4:13:30 PM Version By: Kela Millin Entered By: Kela Millin on 05/02/2020 11:29:22 -------------------------------------------------------------------------------- Pain Assessment Details Patient Name: Date of Service: Joseph Fitz, PA UL C. 03/28/2020 1:30 PM Medical Record Number: [id number] Patient Account Number: [id number] Date of Birth/Sex: Treating RN: 26-Jun-1956 (64 y.o. Joseph [name] Primary Care Candida Vetter: Lynne Logan Other Clinician: Referring Rhydian Baldi: Treating Dellis Voght/Extender: Lawerance Cruel Weeks in Treatment: 2 Active Problems Location of Pain Severity and Description of Pain Patient Has Paino No Site Locations Pain Management and Medication Current Pain Management: Electronic Signature(s) Signed: 03/28/2020 4:13:30 PM By: Kela Millin Signed: 06/17/2020 8:09:04 AM By: Sandre Kitty Entered By: Sandre Kitty on 03/28/2020  13:47:44 -------------------------------------------------------------------------------- Patient/Caregiver Education Details Patient Name: Date of Service: Joseph Fitz, PA UL C. 7/2/2021andnbsp1:30 PM Medical Record Number: [id number] Patient Account Number: [id number] Date of Birth/Gender: Treating RN: [birth year] (64 y.o. Joseph [name] Primary Care Physician: Lynne Logan Other Clinician: Referring Physician: Treating Physician/Extender: Lorenza Evangelist in Treatment: 2 Education Assessment Education Provided To: Patient Education Topics Provided Nutrition: Handouts: Elevated Blood Sugars: How Do They Affect Wound Healing Methods: Explain/Verbal Responses: State content correctly Safety: Handouts: Personal Safety Methods: Explain/Verbal Responses: State content correctly Welcome T [facility]: o Handouts: Welcome T [facility] o Methods: Explain/Verbal Responses: State content correctly Wound/Skin Impairment: Methods: Explain/Verbal Responses: State content correctly Electronic Signature(s) Signed: 03/28/2020 4:13:30 PM By: Kela Millin Entered By: Kela Millin on 03/28/2020 13:38:22 -------------------------------------------------------------------------------- Wound Assessment Details Patient Name: Date of Service: Joseph Fitz, PA UL C. 03/28/2020 1:30 PM Medical Record Number: [id number] Patient Account Number: [id number] Date of Birth/Sex: Treating RN: 02/01/1956 (64 y.o. Joseph [name] Primary Care Yulia Ulrich: Lynne Logan Other Clinician: Referring Rebeca Valdivia: Treating Devyon Keator/Extender: Linton Ham  Lynne Logan Weeks in Treatment: 2 Wound Status Wound Number: 4 Primary Diabetic Wound/Ulcer of the Lower Extremity Etiology: Wound Location: Right T Great oe Wound Open Wounding Event: Gradually Appeared Status: Date Acquired: 02/15/2020 Comorbid Cataracts, Sleep Apnea, Arrhythmia, Hypertension,  Peripheral Weeks Of Treatment: 2 History: Venous Disease, Type II Diabetes, Neuropathy Clustered Wound: No Photos Photo Uploaded By: Mikeal Hawthorne on 04/01/2020 12:55:48 Wound Measurements Length: (cm) 2 Width: (cm) 2.4 Depth: (cm) 0.1 Area: (cm) 3.77 Volume: (cm) 0.377 % Reduction in Area: 12.7% % Reduction in Volume: 12.7% Epithelialization: None Tunneling: No Undermining: No Wound Description Classification: Grade 2 Wound Margin: Distinct, outline attached Exudate Amount: Medium Exudate Type: Serosanguineous Exudate Color: red, brown Foul Odor After Cleansing: No Slough/Fibrino Yes Wound Bed Granulation Amount: Small (1-33%) Exposed Structure Granulation Quality: Pink Fascia Exposed: No Necrotic Amount: Large (67-100%) Fat Layer (Subcutaneous Tissue) Exposed: Yes Necrotic Quality: Eschar, Adherent Slough Tendon Exposed: No Muscle Exposed: No Joint Exposed: No Bone Exposed: No Electronic Signature(s) Signed: 03/28/2020 3:42:07 PM By: Carlene Coria RN Signed: 03/28/2020 4:13:30 PM By: Kela Millin Entered By: Carlene Coria on 03/28/2020 14:12:51 -------------------------------------------------------------------------------- Wound Assessment Details Patient Name: Date of Service: Joseph Fitz, PA UL C. 03/28/2020 1:30 PM Medical Record Number: [id number] Patient Account Number: [id number] Date of Birth/Sex: Treating RN: Sep 20, 1956 (64 y.o. Joseph [name] Primary Care Sommer Spickard: Lynne Logan Other Clinician: Referring Laurens Matheny: Treating Adrin Julian/Extender: Lawerance Cruel Weeks in Treatment: 2 Wound Status Wound Number: 5 Primary Diabetic Wound/Ulcer of the Lower Extremity Etiology: Wound Location: Left T Great oe Wound Open Wounding Event: Gradually Appeared Status: Date Acquired: 02/19/2020 Date Acquired: 02/19/2020 Comorbid Cataracts, Sleep Apnea, Arrhythmia, Hypertension, Peripheral Weeks Of Treatment: 2 History: Venous Disease, Type II  Diabetes, Neuropathy Clustered Wound: No Photos Photo Uploaded By: Mikeal Hawthorne on 04/01/2020 12:55:49 Wound Measurements Length: (cm) 1.3 Width: (cm) 1.4 Depth: (cm) 0.1 Area: (cm) 1.429 Volume: (cm) 0.143 % Reduction in Area: -127.5% % Reduction in Volume: -127% Epithelialization: None Tunneling: No Undermining: No Wound Description Classification: Grade 2 Wound Margin: Distinct, outline attached Exudate Amount: Medium Exudate Type: Serosanguineous Exudate Color: red, brown Foul Odor After Cleansing: No Slough/Fibrino Yes Wound Bed Granulation Amount: Medium (34-66%) Exposed Structure Granulation Quality: Pink Fascia Exposed: No Necrotic Amount: Medium (34-66%) Fat Layer (Subcutaneous Tissue) Exposed: Yes Necrotic Quality: Adherent Slough Tendon Exposed: No Muscle Exposed: No Joint Exposed: No Bone Exposed: No Electronic Signature(s) Signed: 03/28/2020 3:42:07 PM By: Carlene Coria RN Signed: 03/28/2020 4:13:30 PM By: Kela Millin Entered By: Carlene Coria on 03/28/2020 14:13:16 -------------------------------------------------------------------------------- Vitals Details Patient Name: Date of Service: Joseph Fitz, PA UL C. 03/28/2020 1:30 PM Medical Record Number: [id number] Patient Account Number: [id number] Date of Birth/Sex: Treating RN: Oct 23, 1955 (64 y.o. Joseph [name] Primary Care [place]: Lynne Logan Other Clinician: Referring Rayden Dock: Treating Aicha Clingenpeel/Extender: Lawerance Cruel Weeks in Treatment: 2 Vital Signs Time Taken: 13:47 Temperature (F): 98.5 Height (in): 73 Pulse (bpm): 89 Weight (lbs): 470 Respiratory Rate (breaths/min): 20 Body Mass Index (BMI): 62 Blood Pressure (mmHg): 187/66 Capillary Blood Glucose (mg/dl): 77 Reference Range: 80 - 120 mg / dl Electronic Signature(s) Signed: 06/17/2020 8:09:04 AM By: Sandre Kitty Entered By: Sandre Kitty on 03/28/2020 13:47:31

## 2020-06-21 DIAGNOSIS — G4733 Obstructive sleep apnea (adult) (pediatric): Secondary | ICD-10-CM | POA: Diagnosis not present

## 2020-07-03 ENCOUNTER — Other Ambulatory Visit: Payer: Self-pay

## 2020-07-03 ENCOUNTER — Ambulatory Visit: Payer: PPO | Admitting: Orthotics

## 2020-07-03 DIAGNOSIS — Z89419 Acquired absence of unspecified great toe: Secondary | ICD-10-CM

## 2020-07-03 DIAGNOSIS — L97522 Non-pressure chronic ulcer of other part of left foot with fat layer exposed: Secondary | ICD-10-CM

## 2020-07-03 DIAGNOSIS — E1142 Type 2 diabetes mellitus with diabetic polyneuropathy: Secondary | ICD-10-CM

## 2020-07-03 DIAGNOSIS — E11621 Type 2 diabetes mellitus with foot ulcer: Secondary | ICD-10-CM

## 2020-07-03 DIAGNOSIS — M205X1 Other deformities of toe(s) (acquired), right foot: Secondary | ICD-10-CM

## 2020-07-03 NOTE — Progress Notes (Signed)

## 2020-07-08 ENCOUNTER — Telehealth: Payer: Self-pay | Admitting: Podiatry

## 2020-07-08 NOTE — Telephone Encounter (Signed)
Pt called and is wanting to cancel the order for the diabetic shoes and just get the inserts including the L5000. He does not think he will wear the shoes enough to warrant the cost.  I told pt I would discuss with Liliane Channel and if Liliane Channel has questions he will call pt.

## 2020-07-11 DIAGNOSIS — Z86711 Personal history of pulmonary embolism: Secondary | ICD-10-CM | POA: Diagnosis not present

## 2020-07-11 DIAGNOSIS — Z7901 Long term (current) use of anticoagulants: Secondary | ICD-10-CM | POA: Diagnosis not present

## 2020-07-17 ENCOUNTER — Other Ambulatory Visit: Payer: Self-pay

## 2020-07-17 ENCOUNTER — Ambulatory Visit: Payer: PPO | Admitting: Podiatry

## 2020-07-17 DIAGNOSIS — E1142 Type 2 diabetes mellitus with diabetic polyneuropathy: Secondary | ICD-10-CM | POA: Diagnosis not present

## 2020-07-17 DIAGNOSIS — S90812A Abrasion, left foot, initial encounter: Secondary | ICD-10-CM

## 2020-07-21 DIAGNOSIS — G4733 Obstructive sleep apnea (adult) (pediatric): Secondary | ICD-10-CM | POA: Diagnosis not present

## 2020-07-22 ENCOUNTER — Encounter: Payer: Self-pay | Admitting: Podiatry

## 2020-07-22 NOTE — Progress Notes (Signed)
Subjective:  Patient ID: Joseph [name]., male    DOB: Oct 10, 1955,  MRN: [id number]  Chief Complaint  Patient presents with  . Nail Problem    RIGHT FOOT 4TH TOENAIL PAINFUL    64 y.o. male presents with the above complaint.  Patient presents with complaint of left third and fourth digit superficial abrasion after he had a door that jammed on his foot.  He states is very sensitive to touch.  He wants to make sure that there is no infection as he does not want to lose anymore digit to amputation.  He relates this happened 2 days ago.  He they have been keeping it covered with triple antibiotic and a Band-Aid.  They deny any other acute complaints.  They said the skin has been peeling off as well.  No other acute complaints.  Amputation site previously are healing well and has completely healed   Review of Systems: Negative except as noted in the HPI. Denies N/V/F/Ch.  Past Medical History:  Diagnosis Date  . Anxiety state, unspecified   . Depression   . DVT (deep venous thrombosis) ([place]) 2004   Left knee  . Edema   . Esophageal reflux    occ  . Essential hypertension, benign   . Essential hypertension, benign   . Falls frequently   . History of iron deficiency anemia   . Morbid obesity ([place])   . Myalgia and myositis, unspecified    BACK AND LEGS  . OSA on CPAP   . Peripheral vascular disease, unspecified ([place])   . Personal history of PE (pulmonary embolism) 2004  . Polyneuropathy in diabetes(357.2)   . Poor venous access    PT STATES DIFFICULT TO DRAW BLOOD FROM HIS VEINS  . Pure hypercholesterolemia   . Shortness of breath    DUE TO WEIGHT AND LOSS OF MOBILITY  . Type II or unspecified type diabetes mellitus with neurological manifestations, not stated as uncontrolled(250.60)   . Type II or unspecified type diabetes mellitus without mention of complication, not stated as uncontrolled   . Type II or unspecified type diabetes mellitus without mention of complication,  uncontrolled   . Varicose veins of lower extremities with inflammation   . Wears glasses   . Wound infection after surgery    required hospitalization    Current Outpatient Medications:  .  acetaminophen (TYLENOL) 500 MG tablet, Take 1,000 mg by mouth daily as needed for moderate pain or headache. , Disp: , Rfl:  .  amLODipine (NORVASC) 10 MG tablet, Take 10 mg by mouth every evening. , Disp: , Rfl:  .  buPROPion (WELLBUTRIN XL) 150 MG 24 hr tablet, Take 150 mg by mouth daily., Disp: , Rfl: 1 .  Continuous Blood Gluc Sensor (FREESTYLE LIBRE 2 SENSOR) MISC, APPLY EVERY 14 DAYS AS DIRECTED, Disp: , Rfl:  .  FLUoxetine (PROZAC) 40 MG capsule, Take 40 mg by mouth every evening. , Disp: , Rfl:  .  FREESTYLE LITE test strip, TEST BLOOD SUGAR FOUR TIMES DAILY, Disp: , Rfl:  .  furosemide (LASIX) 40 MG tablet, Take 40 mg by mouth daily. , Disp: , Rfl:  .  indapamide (LOZOL) 2.5 MG tablet, Take 2.5 mg by mouth 2 (two) times daily. , Disp: , Rfl:  .  insulin aspart (NOVOLOG) 100 UNIT/ML injection, Inject 5-20 Units into the skin with breakfast, with lunch, and with evening meal. Sliding scale per meals, Disp: , Rfl:  .  insulin NPH Human (NOVOLIN N)  100 UNIT/ML injection, Inject 30 Units into the skin 2 (two) times daily. , Disp: , Rfl:  .  metFORMIN (GLUCOPHAGE) 1000 MG tablet, Take 1,000 mg by mouth 2 (two) times daily with a meal., Disp: , Rfl:  .  omeprazole (PRILOSEC) 40 MG capsule, Take 40 mg by mouth daily as needed (acid reflux). , Disp: , Rfl:  .  oxyCODONE (OXY IR/ROXICODONE) 5 MG immediate release tablet, Take 1 tablet (5 mg total) by mouth every 8 (eight) hours as needed for severe pain., Disp: 30 tablet, Rfl: 0 .  pioglitazone (ACTOS) 45 MG tablet, Take 45 mg by mouth daily., Disp: , Rfl:  .  pravastatin (PRAVACHOL) 40 MG tablet, Take 40 mg by mouth every evening. , Disp: , Rfl:  .  Prenatal Vit-Fe Fumarate-FA (PRENATAL FA PO), Take 1 tablet by mouth daily., Disp: , Rfl:  .  quinapril  (ACCUPRIL) 40 MG tablet, Take 40 mg by mouth daily. , Disp: , Rfl:  .  silver sulfADIAZINE (SILVADENE) 1 % cream, Apply 1 application topically daily., Disp: 50 g, Rfl: 0 .  warfarin (COUMADIN) 5 MG tablet, Take 5-7.5 mg by mouth See admin instructions. Take 5 mg in the evening on Sun, Mon, Wed, and Fri. Take 7.5 mg in the evening on Tue, Thur, and Sat, Disp: , Rfl:  .  Wheat Dextrin (BENEFIBER) POWD, Take 15 mLs by mouth daily as needed (constipation). Mix in coffee and drink, Disp: , Rfl:   Social History   Tobacco Use  Smoking Status Never Smoker  Smokeless Tobacco Never Used    Allergies  Allergen Reactions  . Tape     Peels skin on legs   Objective:  There were no vitals filed for this visit. There is no height or weight on file to calculate BMI. Constitutional Well developed. Well nourished.  Vascular Dorsalis pedis pulses palpable bilaterally. Posterior tibial pulses palpable bilaterally. Capillary refill normal to all digits.  No cyanosis or clubbing noted. Pedal hair growth normal.  Neurologic Normal speech. Oriented to person, place, and time. Epicritic sensation to light touch grossly present bilaterally.  Dermatologic  superficial ulceration noted without any clinical signs of infection.  No underlying deep wound noted.  No cellulitis or redness noted.  No malodor present.  No purulent drainage noted.  Mild pain on palpation to digits 3 and 4 noted.  Orthopedic: Normal joint ROM without pain or crepitus bilaterally. No visible deformities. No bony tenderness.   Radiographs: None Assessment:   1. Abrasion, left foot, initial encounter   2. Diabetic polyneuropathy associated with type 2 diabetes mellitus ([place])    Plan:  Patient was evaluated and treated and all questions answered.  Left third and fourth digit superficial abrasion with underlying type II diabetes -I explained to the patient the etiology of abrasion and various treatment options were extensively  discussed with the patient.  At this time patient will benefit from discontinue triple antibiotic and a Band-Aid.  I am not concerned for any underlying wounds or any infection at this time.  I discussed with the patient that if there is any redness or cellulitis or any clinical signs of infection that presents itself I have asked him to call the office right away or go to the emergency room.  They state understanding. -Triple Antibiotic and a Band-Aid   No follow-ups on file.

## 2020-08-08 DIAGNOSIS — Z86711 Personal history of pulmonary embolism: Secondary | ICD-10-CM | POA: Diagnosis not present

## 2020-08-08 DIAGNOSIS — Z7901 Long term (current) use of anticoagulants: Secondary | ICD-10-CM | POA: Diagnosis not present

## 2020-08-15 ENCOUNTER — Other Ambulatory Visit: Payer: Self-pay

## 2020-08-15 ENCOUNTER — Ambulatory Visit: Payer: PPO | Admitting: Podiatry

## 2020-08-15 DIAGNOSIS — M216X1 Other acquired deformities of right foot: Secondary | ICD-10-CM | POA: Diagnosis not present

## 2020-08-15 DIAGNOSIS — S90812A Abrasion, left foot, initial encounter: Secondary | ICD-10-CM | POA: Diagnosis not present

## 2020-08-15 DIAGNOSIS — E1140 Type 2 diabetes mellitus with diabetic neuropathy, unspecified: Secondary | ICD-10-CM | POA: Diagnosis not present

## 2020-08-15 DIAGNOSIS — M216X2 Other acquired deformities of left foot: Secondary | ICD-10-CM | POA: Diagnosis not present

## 2020-08-15 DIAGNOSIS — E1142 Type 2 diabetes mellitus with diabetic polyneuropathy: Secondary | ICD-10-CM

## 2020-08-15 DIAGNOSIS — Z89411 Acquired absence of right great toe: Secondary | ICD-10-CM | POA: Diagnosis not present

## 2020-08-15 DIAGNOSIS — E1159 Type 2 diabetes mellitus with other circulatory complications: Secondary | ICD-10-CM | POA: Diagnosis not present

## 2020-08-19 ENCOUNTER — Encounter: Payer: Self-pay | Admitting: Podiatry

## 2020-08-19 NOTE — Progress Notes (Signed)
Subjective:  Patient ID: Joseph [name]., male    DOB: [birth year],  MRN: [id number]  Chief Complaint  Patient presents with  . Foot Pain    Bilateral toe pain.     64 y.o. male presents with the above complaint.  Patient presents with a follow-up of left third and fourth digit superficial abrasion.  He states that it seems to have healed well.  He denies any other acute complaints.  He is a diabetic.  He would also like to know if his diabetic shoes are ready for pickup.   Review of Systems: Negative except as noted in the HPI. Denies N/V/F/Ch.  Past Medical History:  Diagnosis Date  . Anxiety state, unspecified   . Depression   . DVT (deep venous thrombosis) ([place]) 2004   Left knee  . Edema   . Esophageal reflux    occ  . Essential hypertension, benign   . Essential hypertension, benign   . Falls frequently   . History of iron deficiency anemia   . Morbid obesity ([place])   . Myalgia and myositis, unspecified    BACK AND LEGS  . OSA on CPAP   . Peripheral vascular disease, unspecified ([place])   . Personal history of PE (pulmonary embolism) 2004  . Polyneuropathy in diabetes(357.2)   . Poor venous access    PT STATES DIFFICULT TO DRAW BLOOD FROM HIS VEINS  . Pure hypercholesterolemia   . Shortness of breath    DUE TO WEIGHT AND LOSS OF MOBILITY  . Type II or unspecified type diabetes mellitus with neurological manifestations, not stated as uncontrolled(250.60)   . Type II or unspecified type diabetes mellitus without mention of complication, not stated as uncontrolled   . 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 Varicose veins of lower extremities with inflammation   . Wears glasses   . Wound infection after surgery    required hospitalization    Current Outpatient Medications:  .  acetaminophen (TYLENOL) 500 MG tablet, Take 1,000 mg by mouth daily as needed for moderate pain or headache. , Disp: , Rfl:  .   amLODipine (NORVASC) 10 MG tablet, Take 10 mg by mouth every evening. , Disp: , Rfl:  .  buPROPion (WELLBUTRIN XL) 150 MG 24 hr tablet, Take 150 mg by mouth daily., Disp: , Rfl: 1 .  Continuous Blood Gluc Sensor (FREESTYLE LIBRE 2 SENSOR) MISC, APPLY EVERY 14 DAYS AS DIRECTED, Disp: , Rfl:  .  FLUoxetine (PROZAC) 40 MG capsule, Take 40 mg by mouth every evening. , Disp: , Rfl:  .  FREESTYLE LITE test strip, TEST BLOOD SUGAR FOUR TIMES DAILY, Disp: , Rfl:  .  furosemide (LASIX) 40 MG tablet, Take 40 mg by mouth daily. , Disp: , Rfl:  .  indapamide (LOZOL) 2.5 MG tablet, Take 2.5 mg by mouth 2 (two) times daily. , Disp: , Rfl:  .  insulin aspart (NOVOLOG) 100 UNIT/ML injection, Inject 5-20 Units into the skin with breakfast, with lunch, and with evening meal. Sliding scale per meals, Disp: , Rfl:  .  insulin NPH Human (NOVOLIN N) 100 UNIT/ML injection, Inject 30 Units into the skin 2 (two) times daily. , Disp: , Rfl:  .  metFORMIN (GLUCOPHAGE) 1000 MG tablet, Take 1,000 mg by mouth 2 (two) times daily with a meal., Disp: , Rfl:  .  omeprazole (PRILOSEC) 40 MG capsule, Take 40 mg by mouth daily as needed (acid reflux). ,  Disp: , Rfl:  .  oxyCODONE (OXY IR/ROXICODONE) 5 MG immediate release tablet, Take 1 tablet (5 mg total) by mouth every 8 (eight) hours as needed for severe pain., Disp: 30 tablet, Rfl: 0 .  pioglitazone (ACTOS) 45 MG tablet, Take 45 mg by mouth daily., Disp: , Rfl:  .  pravastatin (PRAVACHOL) 40 MG tablet, Take 40 mg by mouth every evening. , Disp: , Rfl:  .  Prenatal Vit-Fe Fumarate-FA (PRENATAL FA PO), Take 1 tablet by mouth daily., Disp: , Rfl:  .  quinapril (ACCUPRIL) 40 MG tablet, Take 40 mg by mouth daily. , Disp: , Rfl:  .  silver sulfADIAZINE (SILVADENE) 1 % cream, Apply 1 application topically daily., Disp: 50 g, Rfl: 0 .  Vitamin D, Ergocalciferol, (DRISDOL) 1.25 MG (50000 UNIT) CAPS capsule, Take 50,000 Units by mouth once a week., Disp: , Rfl:  .  warfarin (COUMADIN) 5 MG  tablet, Take 5-7.5 mg by mouth See admin instructions. Take 5 mg in the evening on Sun, Mon, Wed, and Fri. Take 7.5 mg in the evening on Tue, Thur, and Sat, Disp: , Rfl:  .  Wheat Dextrin (BENEFIBER) POWD, Take 15 mLs by mouth daily as needed (constipation). Mix in coffee and drink, Disp: , Rfl:   Social History   Tobacco Use  Smoking Status Never Smoker  Smokeless Tobacco Never Used    Allergies  Allergen Reactions  . Tape     Peels skin on legs   Objective:  There were no vitals filed for this visit. There is no height or weight on file to calculate BMI. Constitutional Well developed. Well nourished.  Vascular Dorsalis pedis pulses palpable bilaterally. Posterior tibial pulses palpable bilaterally. Capillary refill normal to all digits.  No cyanosis or clubbing noted. Pedal hair growth normal.  Neurologic Normal speech. Oriented to person, place, and time. Epicritic sensation to light touch grossly present bilaterally.  Dermatologic  no further superficial ulceration noted without any clinical signs of infection.  No underlying deep wound noted.  No cellulitis or redness noted.  No malodor present.  No purulent drainage noted.  No pain on palpation to digits 3 and 4 noted.  Orthopedic: Normal joint ROM without pain or crepitus bilaterally. No visible deformities. No bony tenderness.   Radiographs: None Assessment:   1. Abrasion, left foot, initial encounter   2. Diabetic polyneuropathy associated with type 2 diabetes mellitus ([place])    Plan:  Patient was evaluated and treated and all questions answered.  Left third and fourth digit superficial abrasion with underlying type II diabetes -Clinically healed.  At this time no concern for any acute ulcerations or pressure sore.  I encouraged him to use his diabetic shoes.  The diabetic shoes were dispensed appears to be functioning well. -I discussed with them if any foot and ankle issues arise in the future to come back and  see me.  He states understanding   No follow-ups on file.

## 2020-08-21 DIAGNOSIS — G4733 Obstructive sleep apnea (adult) (pediatric): Secondary | ICD-10-CM | POA: Diagnosis not present

## 2020-08-25 DIAGNOSIS — E1136 Type 2 diabetes mellitus with diabetic cataract: Secondary | ICD-10-CM | POA: Diagnosis not present

## 2020-08-25 DIAGNOSIS — H25013 Cortical age-related cataract, bilateral: Secondary | ICD-10-CM | POA: Diagnosis not present

## 2020-08-25 DIAGNOSIS — H5213 Myopia, bilateral: Secondary | ICD-10-CM | POA: Diagnosis not present

## 2020-08-25 DIAGNOSIS — H52203 Unspecified astigmatism, bilateral: Secondary | ICD-10-CM | POA: Diagnosis not present

## 2020-08-25 DIAGNOSIS — Z794 Long term (current) use of insulin: Secondary | ICD-10-CM | POA: Diagnosis not present

## 2020-08-25 DIAGNOSIS — H524 Presbyopia: Secondary | ICD-10-CM | POA: Diagnosis not present

## 2020-08-25 DIAGNOSIS — H2513 Age-related nuclear cataract, bilateral: Secondary | ICD-10-CM | POA: Diagnosis not present

## 2020-09-05 ENCOUNTER — Other Ambulatory Visit: Payer: Self-pay

## 2020-09-05 ENCOUNTER — Ambulatory Visit: Payer: PPO | Admitting: Orthotics

## 2020-09-05 DIAGNOSIS — E1142 Type 2 diabetes mellitus with diabetic polyneuropathy: Secondary | ICD-10-CM

## 2020-09-12 DIAGNOSIS — E1140 Type 2 diabetes mellitus with diabetic neuropathy, unspecified: Secondary | ICD-10-CM | POA: Diagnosis not present

## 2020-09-12 DIAGNOSIS — Z7901 Long term (current) use of anticoagulants: Secondary | ICD-10-CM | POA: Diagnosis not present

## 2020-09-12 DIAGNOSIS — E7800 Pure hypercholesterolemia, unspecified: Secondary | ICD-10-CM | POA: Diagnosis not present

## 2020-09-12 DIAGNOSIS — Z89411 Acquired absence of right great toe: Secondary | ICD-10-CM | POA: Diagnosis not present

## 2020-09-12 DIAGNOSIS — F411 Generalized anxiety disorder: Secondary | ICD-10-CM | POA: Diagnosis not present

## 2020-09-12 DIAGNOSIS — Z86711 Personal history of pulmonary embolism: Secondary | ICD-10-CM | POA: Diagnosis not present

## 2020-09-12 DIAGNOSIS — I10 Essential (primary) hypertension: Secondary | ICD-10-CM | POA: Diagnosis not present

## 2020-09-12 DIAGNOSIS — Z7984 Long term (current) use of oral hypoglycemic drugs: Secondary | ICD-10-CM | POA: Diagnosis not present

## 2020-09-12 DIAGNOSIS — Z23 Encounter for immunization: Secondary | ICD-10-CM | POA: Diagnosis not present

## 2020-09-12 DIAGNOSIS — E559 Vitamin D deficiency, unspecified: Secondary | ICD-10-CM | POA: Diagnosis not present

## 2020-09-30 ENCOUNTER — Ambulatory Visit: Payer: PPO | Admitting: Orthotics

## 2020-11-07 ENCOUNTER — Telehealth: Payer: Self-pay | Admitting: Podiatry

## 2020-11-07 NOTE — Telephone Encounter (Signed)
Pt called asking if he could just pick up his reordered diabetic shoes. He had to cancel his last appt due to exposure.  I told pt ok to pick them up and he said he would come today and I told him we close for lunch between 12 to 1.

## 2021-03-12 NOTE — Progress Notes (Signed)
Pt was seen in our office with EJ from [place]. We reordered diabetic  shoes for him. Shoe: [employer]pex [id number] X W. He will be called when his shoes arrive.

## 2021-05-13 ENCOUNTER — Other Ambulatory Visit: Payer: Self-pay | Admitting: Physician Assistant

## 2021-05-13 DIAGNOSIS — Z86010 Personal history of colonic polyps: Secondary | ICD-10-CM

## 2021-06-08 ENCOUNTER — Emergency Department (HOSPITAL_COMMUNITY)
Admission: EM | Admit: 2021-06-08 | Discharge: 2021-06-08 | Disposition: A | Discharge status: Home / Self Care | Payer: HMO | Attending: Emergency Medicine | Admitting: Emergency Medicine

## 2021-06-08 ENCOUNTER — Encounter (HOSPITAL_COMMUNITY): Payer: Self-pay | Admitting: Emergency Medicine

## 2021-06-08 ENCOUNTER — Other Ambulatory Visit: Payer: Self-pay

## 2021-06-08 ENCOUNTER — Emergency Department (HOSPITAL_COMMUNITY): Payer: HMO

## 2021-06-08 DIAGNOSIS — Z7984 Long term (current) use of oral hypoglycemic drugs: Secondary | ICD-10-CM | POA: Insufficient documentation

## 2021-06-08 DIAGNOSIS — I129 Hypertensive chronic kidney disease with stage 1 through stage 4 chronic kidney disease, or unspecified chronic kidney disease: Secondary | ICD-10-CM | POA: Diagnosis not present

## 2021-06-08 DIAGNOSIS — Z7902 Long term (current) use of antithrombotics/antiplatelets: Secondary | ICD-10-CM | POA: Diagnosis not present

## 2021-06-08 DIAGNOSIS — M25562 Pain in left knee: Secondary | ICD-10-CM | POA: Diagnosis not present

## 2021-06-08 DIAGNOSIS — W182XXA Fall in (into) shower or empty bathtub, initial encounter: Secondary | ICD-10-CM | POA: Insufficient documentation

## 2021-06-08 DIAGNOSIS — N183 Chronic kidney disease, stage 3 unspecified: Secondary | ICD-10-CM | POA: Diagnosis not present

## 2021-06-08 DIAGNOSIS — E1122 Type 2 diabetes mellitus with diabetic chronic kidney disease: Secondary | ICD-10-CM | POA: Insufficient documentation

## 2021-06-08 DIAGNOSIS — S8992XA Unspecified injury of left lower leg, initial encounter: Secondary | ICD-10-CM | POA: Diagnosis not present

## 2021-06-08 DIAGNOSIS — Z79899 Other long term (current) drug therapy: Secondary | ICD-10-CM | POA: Diagnosis not present

## 2021-06-08 DIAGNOSIS — Z794 Long term (current) use of insulin: Secondary | ICD-10-CM | POA: Insufficient documentation

## 2021-06-08 DIAGNOSIS — Y92002 Bathroom of unspecified non-institutional (private) residence single-family (private) house as the place of occurrence of the external cause: Secondary | ICD-10-CM | POA: Insufficient documentation

## 2021-06-08 DIAGNOSIS — R7309 Other abnormal glucose: Secondary | ICD-10-CM | POA: Insufficient documentation

## 2021-06-08 DIAGNOSIS — S8990XA Unspecified injury of unspecified lower leg, initial encounter: Secondary | ICD-10-CM

## 2021-06-08 DIAGNOSIS — W19XXXA Unspecified fall, initial encounter: Secondary | ICD-10-CM

## 2021-06-08 LAB — BASIC METABOLIC PANEL
Anion gap: 12 (ref 5–15)
BUN: 43 mg/dL — ABNORMAL HIGH (ref 8–23)
CO2: 24 mmol/L (ref 22–32)
Calcium: 8.4 mg/dL — ABNORMAL LOW (ref 8.9–10.3)
Chloride: 102 mmol/L (ref 98–111)
Creatinine, Ser: 1.4 mg/dL — ABNORMAL HIGH (ref 0.61–1.24)
GFR, Estimated: 56 mL/min — ABNORMAL LOW (ref >60)
Glucose, Bld: 146 mg/dL — ABNORMAL HIGH (ref 70–99)
Potassium: 4.7 mmol/L (ref 3.5–5.1)
Sodium: 138 mmol/L (ref 135–145)

## 2021-06-08 LAB — CBC WITH DIFFERENTIAL/PLATELET
Abs Immature Granulocytes: 0.03 10*3/uL (ref 0.00–0.07)
Basophils Absolute: 0.1 10*3/uL (ref 0.0–0.1)
Basophils Relative: 1 %
Eosinophils Absolute: 0.3 10*3/uL (ref 0.0–0.5)
Eosinophils Relative: 4 %
HCT: 41.6 % (ref 39.0–52.0)
Hemoglobin: 12.7 g/dL — ABNORMAL LOW (ref 13.0–17.0)
Immature Granulocytes: 0 %
Lymphocytes Relative: 17 %
Lymphs Abs: 1.3 10*3/uL (ref 0.7–4.0)
MCH: 28.9 pg (ref 26.0–34.0)
MCHC: 30.5 g/dL (ref 30.0–36.0)
MCV: 94.5 fL (ref 80.0–100.0)
Monocytes Absolute: 0.6 10*3/uL (ref 0.1–1.0)
Monocytes Relative: 7 %
Neutro Abs: 5.6 10*3/uL (ref 1.7–7.7)
Neutrophils Relative %: 71 %
Platelets: 235 10*3/uL (ref 150–400)
RBC: 4.4 MIL/uL (ref 4.22–5.81)
RDW: 15.7 % — ABNORMAL HIGH (ref 11.5–15.5)
WBC: 7.8 10*3/uL (ref 4.0–10.5)
nRBC: 0 % (ref 0.0–0.2)

## 2021-06-08 LAB — CBG MONITORING, ED: Glucose-Capillary: 190 mg/dL — ABNORMAL HIGH (ref 70–99)

## 2021-06-08 LAB — PROTIME-INR
INR: 2.6 — ABNORMAL HIGH (ref 0.8–1.2)
Prothrombin Time: 27.8 seconds — ABNORMAL HIGH (ref 11.4–15.2)

## 2021-06-08 MED ORDER — METHOCARBAMOL 500 MG PO TABS: 1000.0000 mg | ORAL_TABLET | Freq: Two times a day (BID) | ORAL | 0 refills | Status: DC

## 2021-06-08 MED ORDER — ACETAMINOPHEN 325 MG PO TABS: 650.0000 mg | ORAL_TABLET | Freq: Four times a day (QID) | ORAL | 0 refills | Status: DC | PRN

## 2021-06-08 MED ORDER — HYDROCODONE-ACETAMINOPHEN 5-325 MG PO TABS
1.0000 | ORAL_TABLET | Freq: Once | ORAL | Status: AC
  Administered 2021-06-08: 1 via ORAL
  Filled 2021-06-08: qty 1

## 2021-06-08 NOTE — Care Management (Signed)
TOC sent referral to [facility] but, referral declined due to patient needing more staff based on BMI to provide safe care.  RNCM will send out to other  [facility] within the [facility] network. TOC will follow up tomorrow

## 2021-06-08 NOTE — ED Notes (Signed)
Pt reports poor balance, which is why he declined crutches. Also verbalizes BUE numbness and weakness.

## 2021-06-08 NOTE — ED Provider Notes (Signed)
[facility] EMERGENCY DEPARTMENT Provider Note   CSN: [id number] Arrival date & time: 06/08/21  3244     History Chief Complaint  Patient presents with   Joseph [name]. is a 65 y.o. male.  65 year old male with history as below including DVT, PE on warfarin.  Presents to the ER secondary to slip and fall in the shower.  Patient reports this was a slipped on the wet tile.  Did not hit his head.  LOC, no prodromal symptoms.  Pain to his left knee after the fall and knee was at an awkward angle while he was on the floor.  No nausea, vomiting, incontinence, numbness or tingling.  No head injury or LOC. Difficulty with ambulation at baseline secondary to obesity.  No chest pain, dyspnea, abdominal pain, nausea, vomiting.  No vision or hearing changes.  No numbness or tingling.  Last took his warfarin yesterday morning.  No significant pain at this time.  The history is provided by the patient and the EMS personnel. No language interpreter was used.  Fall This is a new problem. The current episode started less than 1 hour ago. Pertinent negatives include no chest pain, no abdominal pain, no headaches and no shortness of breath. He has tried nothing for the symptoms.      Past Medical History:  Diagnosis Date   Anxiety state, unspecified    Depression    DVT (deep venous thrombosis) ([place]) 2004   Left knee   Edema    Esophageal reflux    occ   Essential hypertension, benign    Essential hypertension, benign    Falls frequently    History of iron deficiency anemia    Morbid obesity (HCC)    Myalgia and myositis, unspecified    BACK AND LEGS   OSA on CPAP    Peripheral vascular disease, unspecified (HCC)    Personal history of PE (pulmonary embolism) 2004   Polyneuropathy in diabetes(357.2)    Poor venous access    PT STATES DIFFICULT TO DRAW BLOOD FROM HIS VEINS   Pure hypercholesterolemia    Shortness of breath    DUE TO WEIGHT AND LOSS OF MOBILITY    Type II or unspecified type diabetes mellitus with neurological manifestations, not stated as uncontrolled(250.60)    Type II or unspecified type diabetes mellitus without mention of complication, not stated as uncontrolled    Type II or unspecified type diabetes mellitus without mention of complication, uncontrolled    Varicose veins of lower extremities with inflammation    Wears glasses    Wound infection after surgery    required hospitalization    Patient Active Problem List   Diagnosis Date Noted   Osteomyelitis of great toe of right foot ([place]) 04/03/2020   Osteomyelitis of great toe of left foot ([place]) 04/03/2020   CKD (chronic kidney disease) stage 3, GFR 30-59 ml/min ([place]) 04/03/2020   Monitoring for anticoagulant use 02/19/2020   Accumulation of fluid in tissues 02/19/2020   Anxiety state 02/19/2020   Diabetes mellitus type 2, uncontrolled ([place]) 02/19/2020   Diabetes mellitus with polyneuropathy ([place]) 02/19/2020   Fibrositis 02/19/2020   Peripheral blood vessel disorder ([place]) 02/19/2020   Varicose veins of lower extremities with inflammation 02/19/2020   Acute renal insufficiency 01/03/2015   Esophageal reflux 01/02/2015   Polyneuropathy in diabetes(357.2) 01/02/2015   Abscess of postoperative wound of abdominal wall 01/02/2015   Cellulitis and abscess of trunk 12/31/2014  S/P IVC filter-temporary REMOVED    Status post panniculectomy Oct 2015 07/17/2014   Morbid obesity BMI 65 03/22/2014   Pure hypercholesterolemia 03/22/2014   Essential hypertension, benign 03/22/2014   Diabetes ([place]) 03/22/2014   Panniculitis 03/22/2014   OSA (obstructive sleep apnea) 03/22/2014   Hx pulmonary embolism 03/22/2014   Diabetic neuropathy ([place]) 02/20/2014    Past Surgical History:  Procedure Laterality Date   AMPUTATION TOE Bilateral 04/03/2020   Procedure: [place];  Surgeon: Felipa Furnace, DPM;  Location: [facility];  Service: Podiatry;  Laterality: Bilateral;    COLONOSCOPY WITH PROPOFOL N/A 07/20/2019   Procedure: COLONOSCOPY WITH PROPOFOL;  Surgeon: Wonda Horner, MD;  Location: [facility];  Service: Endoscopy;  Laterality: N/A;   I & D EXTREMITY Bilateral 04/05/2020   Procedure: IRRIGATION AND DEBRIDEMENT EXTREMITY WITH CLOSURE - BILATERALLY;  Surgeon: Felipa Furnace, DPM;  Location: [facility];  Service: Podiatry;  Laterality: Bilateral;   NO PAST SURGERIES     PANNICULECTOMY N/A 07/15/2014   Procedure: PANNICULECTOMY;  Surgeon: Pedro Earls, MD;  Location: [facility];  Service: General;  Laterality: N/A;   POLYPECTOMY  07/20/2019   Procedure: POLYPECTOMY;  Surgeon: Wonda Horner, MD;  Location: [facility];  Service: Endoscopy;;       No family history on file.  Social History   Tobacco Use   Smoking status: Never   Smokeless tobacco: Never  Vaping Use   Vaping Use: Never used  Substance Use Topics   Alcohol use: Not Currently   Drug use: No    Home Medications Prior to Admission medications   Medication Sig Start Date End Date Taking? Authorizing Provider  acetaminophen (TYLENOL) 325 MG tablet Take 2 tablets (650 mg total) by mouth every 6 (six) hours as needed. 06/08/21  Yes Jeanell Sparrow, DO  acetaminophen (TYLENOL) 500 MG tablet Take 1,000 mg by mouth daily as needed for moderate pain or headache.    Yes [provider]  amLODipine (NORVASC) 10 MG tablet Take 10 mg by mouth every evening.    Yes [provider]  cephALEXin (KEFLEX) 250 MG capsule Take 250 mg by mouth at bedtime. 06/05/21  Yes [provider]  FLUoxetine (PROZAC) 40 MG capsule Take 40 mg by mouth every evening.    Yes [provider]  furosemide (LASIX) 40 MG tablet Take 40 mg by mouth daily.    Yes [provider]  indapamide (LOZOL) 2.5 MG tablet Take 2.5 mg by mouth 2 (two) times daily.    Yes [provider]  insulin aspart (NOVOLOG) 100 UNIT/ML injection Inject 0-25 Units into the skin with breakfast, with  lunch, and with evening meal. Sliding scale per meals 02/05/20  Yes [provider]  LANTUS 100 UNIT/ML injection Inject 50 Units into the skin at bedtime. 06/08/21  Yes [provider]  metFORMIN (GLUCOPHAGE) 1000 MG tablet Take 1,000 mg by mouth 2 (two) times daily with a meal.   Yes [provider]  methocarbamol (ROBAXIN) 500 MG tablet Take 2 tablets (1,000 mg total) by mouth 2 (two) times daily for 7 days. 06/08/21 06/15/21 Yes Jeanell Sparrow, DO  omeprazole (PRILOSEC) 40 MG capsule Take 40 mg by mouth daily as needed (acid reflux).    Yes [provider]  pioglitazone (ACTOS) 45 MG tablet Take 45 mg by mouth daily.   Yes [provider]  pravastatin (PRAVACHOL) 40 MG tablet Take 40 mg by mouth every evening.  Yes [provider]  Prenatal Vit-Fe Fumarate-FA (PRENATAL FA PO) Take 1 tablet by mouth daily.   Yes [provider]  quinapril (ACCUPRIL) 40 MG tablet Take 40 mg by mouth daily.    Yes [provider]  silver sulfADIAZINE (SILVADENE) 1 % cream Apply 1 application topically daily. Patient taking differently: Apply 1 application topically daily as needed (wounds on toes and legs). 02/26/20  Yes Early, Arvilla Meres, MD  tamsulosin (FLOMAX) 0.4 MG CAPS capsule Take 0.4 mg by mouth daily. 04/02/21  Yes [provider]  warfarin (COUMADIN) 5 MG tablet Take 5-7.5 mg by mouth See admin instructions. Take 5 mg in the evening on Sun, Mon, Wed, and Fri. Take 7.5 mg in the evening on Tue, Thur, and Sat   Yes [provider]  Wheat Dextrin (BENEFIBER) POWD Take 15 mLs by mouth daily as needed (constipation). Mix in coffee and drink   Yes [provider]  buPROPion (WELLBUTRIN XL) 150 MG 24 hr tablet Take 150 mg by mouth daily. Patient not taking: No sig reported 10/20/15   [provider]  Continuous Blood Gluc Sensor (FREESTYLE LIBRE 2 SENSOR) MISC APPLY EVERY 14 DAYS AS DIRECTED 03/28/20   [provider]  FREESTYLE LITE test strip TEST BLOOD SUGAR FOUR TIMES DAILY 02/11/20   [provider]    Allergies    Tape and Chlorhexidine  Review of Systems   Review of Systems  Constitutional:  Negative for chills and fever.  HENT:  Negative for facial swelling and trouble swallowing.   Eyes:  Negative for photophobia and visual disturbance.  Respiratory:  Negative for cough and shortness of breath.   Cardiovascular:  Negative for chest pain and palpitations.  Gastrointestinal:  Negative for abdominal pain, nausea and vomiting.  Endocrine: Negative for polydipsia and polyuria.  Genitourinary:  Negative for difficulty urinating and hematuria.  Musculoskeletal:  Positive for arthralgias. Negative for gait problem and joint swelling.  Skin:  Negative for pallor and rash.  Neurological:  Negative for syncope and headaches.  Psychiatric/Behavioral:  Negative for agitation and confusion.    Physical Exam Updated Vital Signs BP (!) 195/59   Pulse 84   Temp 98 F (36.7 C) (Oral)   Resp 17   SpO2 99%   Physical Exam Vitals and nursing note reviewed.  Constitutional:      General: He is not in acute distress.    Appearance: He is well-developed.  HENT:     Head: Normocephalic and atraumatic. No raccoon eyes, right periorbital erythema or left periorbital erythema.     Right Ear: External ear normal.     Left Ear: External ear normal.     Mouth/Throat:     Mouth: Mucous membranes are moist.  Eyes:     General: Vision grossly intact. No scleral icterus.    Extraocular Movements: Extraocular movements intact.  Cardiovascular:     Rate and Rhythm: Normal rate and regular rhythm.     Pulses: Normal pulses.     Heart sounds: Normal heart sounds.  Pulmonary:     Effort: Pulmonary effort is normal. No respiratory distress.     Breath sounds: Normal breath sounds.  Abdominal:     General: Abdomen is flat.     Palpations: Abdomen is soft.     Tenderness: There is no  abdominal tenderness.  Musculoskeletal:        General: Normal range of motion.     Cervical back: Normal range of motion.  Right lower leg: No edema.     Left lower leg: No edema.       Legs:     Comments: Chronic LE wounds, stasis dermatitis. Limited ROM to left knee 2/2 pain. LE are neurovascularly intact b/l   Skin:    General: Skin is warm and dry.     Capillary Refill: Capillary refill takes less than 2 seconds.  Neurological:     Mental Status: He is alert and oriented to person, place, and time.     GCS: GCS eye subscore is 4. GCS verbal subscore is 5. GCS motor subscore is 6.     Cranial Nerves: Cranial nerves are intact.     Sensory: Sensation is intact.     Motor: Motor function is intact. No tremor.     Coordination: Coordination is intact.  Psychiatric:        Mood and Affect: Mood normal.        Behavior: Behavior normal.    ED Results / Procedures / Treatments   Labs (all labs ordered are listed, but only abnormal results are displayed) Labs Reviewed  CBC WITH DIFFERENTIAL/PLATELET - Abnormal; Notable for the following components:      Result Value   Hemoglobin 12.7 (*)    RDW 15.7 (*)    All other components within normal limits  BASIC METABOLIC PANEL - Abnormal; Notable for the following components:   Glucose, Bld 146 (*)    BUN 43 (*)    Creatinine, Ser 1.40 (*)    Calcium 8.4 (*)    GFR, Estimated 56 (*)    All other components within normal limits  PROTIME-INR - Abnormal; Notable for the following components:   Prothrombin Time 27.8 (*)    INR 2.6 (*)    All other components within normal limits  CBG MONITORING, ED - Abnormal; Notable for the following components:   Glucose-Capillary 190 (*)    All other components within normal limits    EKG EKG Interpretation  Date/Time:  Monday June 08 2021 11:13:31 EDT Ventricular Rate:  78 PR Interval:  217 QRS Duration: 94 QT Interval:  386 QTC Calculation: 434 R Axis:   60 Text  Interpretation: Sinus rhythm Atrial premature complex Borderline prolonged PR interval Anterior infarct, old Similar to prior tracing Confirmed by Wynona Dove ([id number]) on 06/08/2021 12:30:12 PM  Radiology CT HEAD WO CONTRAST (5MM)  Result Date: 06/08/2021 CLINICAL DATA:  Fall, on Coumadin EXAM: CT HEAD WITHOUT CONTRAST TECHNIQUE: Contiguous axial images were obtained from the base of the skull through the vertex without intravenous contrast. COMPARISON:  None. FINDINGS: Brain: No acute intracranial abnormality. Specifically, no hemorrhage, hydrocephalus, mass lesion, acute infarction, or significant intracranial injury. Vascular: No hyperdense vessel or unexpected calcification. Skull: No acute calvarial abnormality. Sinuses/Orbits: No acute findings Other: None IMPRESSION: No acute intracranial abnormality. Electronically Signed   By: Rolm Baptise M.D.   On: 06/08/2021 11:02   CT Cervical Spine Wo Contrast  Result Date: 06/08/2021 CLINICAL DATA:  Head trauma, minor, normal mental status (Age 90-64y) fall on coumadin. EXAM: CT CERVICAL SPINE WITHOUT CONTRAST TECHNIQUE: Multidetector CT imaging of the cervical spine was performed without intravenous contrast. Multiplanar CT image reconstructions were also generated. COMPARISON:  None. FINDINGS: Alignment: Normal.  No listhesis. Skull base and vertebrae: Craniocervical alignment is normal. Atlantodental interval is not widened. No acute fracture of the cervical spine. Circumscribed lytic lesion within the C7 vertebral body is in keeping with a vertebral hemangioma. No suspicious lytic or  blastic bone lesions identified Soft tissues and spinal canal: No prevertebral fluid or swelling. No visible canal hematoma. Disc levels: There is intervertebral disc space narrowing and endplate remodeling at Q4-6 in keeping with changes of moderate degenerative disc disease. Remaining intervertebral disc heights are preserved. Vertebral body heights are preserved. The  prevertebral soft tissues are not thickened on sagittal reformats. Review of the axial images demonstrates: C2-3: Mild bilateral uncovertebral arthrosis. No significant canal stenosis or neuroforaminal narrowing. C3-4: Moderate left uncovertebral arthrosis results in mild left neuroforaminal narrowing. No significant canal stenosis. C4-5: Unremarkable. C5-6: Unremarkable C6-7: Posterior disc osteophyte complex, eccentrically more severe involving the left canal space and foraminal zone results in moderate to severe left neuroforaminal narrowing. There is moderate central canal stenosis with the estimated AP diameter of the spinal canal of approximately 7 mm at this level. C7-T1: Left paracentral disc herniation results in severe left neuroforaminal narrowing and possible impingement of the crossing left C8 nerve root. Mild central canal stenosis. Upper chest: Biapical ground-glass pulmonary infiltrates are nonspecific and not well assessed on this examination. Other: None IMPRESSION: No acute fracture or listhesis of the cervical spine. Degenerative disc and degenerative joint disease resulting in moderate to severe left neuroforaminal narrowing at C6-7 and C7-T1 as well as mild-to-moderate central canal stenosis, most severe at C6-7. This could be better assessed with MRI examination, if indicated. Biapical ground-glass pulmonary infiltrates, not well assessed on this examination. Electronically Signed   By: Fidela Salisbury M.D.   On: 06/08/2021 11:31   DG Knee Complete 4 Views Left  Result Date: 06/08/2021 CLINICAL DATA:  Fall, medial knee pain EXAM: LEFT KNEE - COMPLETE 4+ VIEW COMPARISON:  None. FINDINGS: Calcifications noted medial to the knee joint likely related to old injury. Small joint effusion. No acute fracture, subluxation or dislocation. IMPRESSION: No acute bony abnormality. Electronically Signed   By: Rolm Baptise M.D.   On: 06/08/2021 11:01   DG HIP UNILAT WITH PELVIS 2-3 VIEWS LEFT  Result  Date: 06/08/2021 CLINICAL DATA:  Fall in shower.  Hip pain EXAM: DG HIP (WITH OR WITHOUT PELVIS) 2-3V LEFT COMPARISON:  None. FINDINGS: Mild symmetric degenerative changes in the hips with joint space narrowing and spurring. No acute bony abnormality. Specifically, no fracture, subluxation, or dislocation. IMPRESSION: No acute bony abnormality. Electronically Signed   By: Rolm Baptise M.D.   On: 06/08/2021 11:01    Procedures Procedures   Medications Ordered in ED Medications  HYDROcodone-acetaminophen (NORCO/VICODIN) 5-325 MG per tablet 1 tablet (has no administration in time range)    ED Course  I have reviewed the triage vital signs and the nursing notes.  Pertinent labs & imaging results that were available during my care of the patient were reviewed by me and considered in my medical decision making (see chart for details).    MDM Rules/Calculators/A&P                           This patient complains of fall, knee pain left; this involves an extensive number of treatment options and is a complaint that carries with it a high risk of complications and morbidity.  Vital signs reviewed and are stable.  Neurologic exam is nonfocal.  No external evidence of head trauma.  Aox3. GCS 15. Serious etiologies considered.  No head injury, neuro exam is non-focal. Given OAC and fall will obtain CTH/C-spine which was obtained and is non-acute.   Labs reviewed and are stable, he  is therapeutic on his warfarin at 2.6.  XR non-acute  Given patients location of pain and injury it is possible he has a ligamentous injury to left knee. No significant ligamental laxity on assessment, no large knee effusion to the left. Will place in knee immobilizer and have PT/OT assess as pt refused crutches. Will require a wheel chair to get home. Pain well controlled. Recommend he continue supportive care and f/u with orthopedics as outpatient in the next few days. This was discussed at length with family and  pt at  bedside.   The patient improved significantly and was discharged in stable condition. Detailed discussions were had with the patient regarding current findings, and need for close f/u with PCP or on call doctor. The patient has been instructed to return immediately if the symptoms worsen in any way for re-evaluation. Patient verbalized understanding and is in agreement with current care plan. All questions answered prior to discharge.     Final Clinical Impression(s) / ED Diagnoses Final diagnoses:  Fall, initial encounter  Knee injury, unspecified laterality, initial encounter  Fall in home, initial encounter    Rx / DC Orders ED Discharge Orders          Ordered    For home use only DME standard manual wheelchair with seat cushion  Status:  Canceled       Comments: Patient suffers from debility which impairs their ability to perform daily activities like bathing, dressing, feeding, grooming, and toileting in the home.  A cane, crutch, or walker will not resolve issue with performing activities of daily living. A wheelchair will allow patient to safely perform daily activities. Patient can safely propel the wheelchair in the home or has a caregiver who can provide assistance. Length of need Lifetime. Accessories: elevating leg rests (ELRs), wheel locks, extensions and anti-tippers.  HEAVY DUTY WHEELCHAIR ONLY   06/08/21 1620    acetaminophen (TYLENOL) 325 MG tablet  Every 6 hours PRN        06/08/21 1656    methocarbamol (ROBAXIN) 500 MG tablet  2 times daily        06/08/21 1656             Jeanell Sparrow, DO 06/08/21 1818

## 2021-06-08 NOTE — Care Management (Deleted)
DME  Note Patient suffers from debility which impairs their ability to perform daily activities like bathing, dressing, feeding, grooming, and toileting in the home.  A cane, crutch, or walker will not resolve issue with performing activities of daily living. A wheelchair will allow patient to safely perform daily activities. Patient can safely propel the wheelchair in the home or has a caregiver who can provide assistance. Length of need Lifetime.  Accessories: elevating leg rests (ELRs), wheel locks, extensions and anti-tippers.   Wt. 213kg [phone] 6'1' Joseph [name]

## 2021-06-08 NOTE — ED Notes (Addendum)
SW finished at [facility]. Repositioned for comfort. Black tarp removed from underneath pt. Knee immobilizer remains in place. "Feel better". Wife at [facility].

## 2021-06-08 NOTE — ED Notes (Signed)
[facility] have delivered bariatric manual w/c. Pt to now be d/c'd.

## 2021-06-08 NOTE — Evaluation (Signed)
Occupational Therapy Evaluation Patient Details Name: Joseph [name]. MRN: [id number] DOB: June 01, 1956 Today's Date: 06/08/2021   History of Present Illness 65 yo male s/p fall in shower with knee at awkward angle on 06/08/21.  Xray of knee and pelvis negative.  PMH including DVT, PE, morbid obesity, DM, bil great toe amputations, and peripheral neuropathy.   Clinical Impression   PTA, pt was living with his wife and was performing BADLs and using rollator for functional mobility outside of home. Pt currently requiring Min A for LB ADLs and Min Guard A for functional mobility with bariatric RW. Pt presenting with decreased strength, balance, and activity tolerance. Pt requiring frequent seated rest breaks. Recommend dc to home once medically stable per physician. All acute OT needs met and will sign off.      Recommendations for follow up therapy are one component of a multi-disciplinary discharge planning process, led by the attending physician.  Recommendations may be updated based on patient status, additional functional criteria and insurance authorization.   Follow Up Recommendations  No OT follow up    Equipment Recommendations  Other (comment);Wheelchair (measurements OT) (bariatric)    Recommendations for Other Services       Precautions / Restrictions Precautions Precautions: Fall      Mobility Bed Mobility Overal bed mobility: Needs Assistance Bed Mobility: Supine to Sit;Sit to Supine     Supine to sit: Min assist Sit to supine: Min assist   General bed mobility comments: Pt needed hand to pull up on but was otherwise able to sit on his own; min A for L LE back to bed    Transfers Overall transfer level: Needs assistance Equipment used: Rolling walker (2 wheeled) Transfers: Sit to/from Stand Sit to Stand: Min guard         General transfer comment: Utilized bariatric RW; sit to stand x 3 throughout session (from stretcher and transport chair); therapist  stabilizing RW but otherwise did not provide assist; at home pt has armrest that are better positioned and a grab bar at the bed    Balance Overall balance assessment: Needs assistance Sitting-balance support: No upper extremity supported Sitting balance-Leahy Scale: Good     Standing balance support: Bilateral upper extremity supported;No upper extremity supported Standing balance-Leahy Scale: Fair Standing balance comment: RW to ambulate but able to pull up underwear and shorts in standing                           ADL either performed or assessed with clinical judgement   ADL Overall ADL's : Needs assistance/impaired Eating/Feeding: Set up;Sitting   Grooming: Set up;Sitting   Upper Body Bathing: Minimal assistance;Sitting   Lower Body Bathing: Minimal assistance;Sit to/from stand   Upper Body Dressing : Supervision/safety;Set up;Sitting Upper Body Dressing Details (indicate cue type and reason): don shirt Lower Body Dressing: Minimal assistance;With caregiver independent assisting;Sit to/from stand Lower Body Dressing Details (indicate cue type and reason): wife assisting to don underwear nad pants over feet. Pt then standing with min guard A and pulling pants over hips Toilet Transfer: Min guard;Ambulation;RW           Functional mobility during ADLs: Min guard;Rolling walker General ADL Comments: Pt presenting with decreased strength, activity tolerance, and balance. Not far from baseline function     Vision Baseline Vision/History: 1 Wears glasses Patient Visual Report: No change from baseline       Perception     Praxis  Pertinent Vitals/Pain Pain Assessment: No/denies pain     Hand Dominance     Extremity/Trunk Assessment Upper Extremity Assessment Upper Extremity Assessment: Generalized weakness   Lower Extremity Assessment Lower Extremity Assessment: Defer to PT evaluation RLE Deficits / Details: ROM : hip limited by body habitus but  WFL, full knee and ankle ROM; MMT: knee ext 5/5 and ankle DF 5/5, hip 3/5 not further tested RLE Sensation: decreased light touch;history of peripheral neuropathy (worse in feet but does have throughout lower leg) LLE Deficits / Details: ROM : hip limited by body habitus but WFL, full knee and ankle ROM; MMT: knee ext 5/5 and ankle DF 5/5, hip 3/5 not further tested; +ecchymosis, edema, tightness L knee LLE Sensation: decreased light touch;history of peripheral neuropathy (worse in feet but does have throughout lower leg)   Cervical / Trunk Assessment Cervical / Trunk Assessment: Other exceptions Cervical / Trunk Exceptions: morbid obesity; body habitus limits   Communication Communication Communication: No difficulties   Cognition Arousal/Alertness: Awake/alert Behavior During Therapy: WFL for tasks assessed/performed Overall Cognitive Status: Within Functional Limits for tasks assessed                                 General Comments: wife and daughter present   General Comments  VSS. Spoke with pt, wife, daughter about how he did with therapy and any concerns about home. They have a ramp to enter home but wife reports is in questionable condition and too far for pt to walk in current condition - discussed options of EMS transport vs w/c. Interested in w/c as pt would benefit from it due to limited endurance baseline. NOtified case Freight forwarder. Wife expressed concern of could his L knee have an injury that's not boney and didn't show on xray. Discussed he didn't have pain and was stable on his knee but would notify MD of her concerns. Pt feels that he is not far from his baseline and comfortable returning home.  Also advised safe ice use and compression with ACE wraps for edema in L knee    Exercises     Shoulder Instructions      Home Living Family/patient expects to be discharged to:: Private residence Living Arrangements: Spouse/significant other Available Help at  Discharge: Family;Available 24 hours/day Type of Home: House Home Access: Ramped entrance (ramp in questionable condition)     Home Layout: One level     Bathroom Shower/Tub: Occupational psychologist: Standard     Home Equipment: Clinical cytogeneticist - 4 wheels;Cane - single point   Additional Comments: does have arm rest on side of toilet and grab bar at bed to pull up on; has a power chair but >39 years old and doesn't work      Prior Functioning/Environment Level of Independence: Needs assistance  Gait / Transfers Assistance Needed: ambulates with rollator in community; no AD in house but touches walls/furniture ADL's / Homemaking Assistance Needed: Showers using shower chair; independent with dressing except socks   Comments: Other than this fall the last one was January        OT Problem List: Decreased strength;Decreased range of motion;Decreased activity tolerance;Impaired balance (sitting and/or standing);Decreased knowledge of use of DME or AE;Decreased knowledge of precautions;Obesity      OT Treatment/Interventions:      OT Goals(Current goals can be found in the care plan section) Acute Rehab OT Goals Patient Stated Goal: Go home  OT Goal Formulation: All assessment and education complete, DC therapy  OT Frequency:     Barriers to D/C:            Co-evaluation PT/OT/SLP Co-Evaluation/Treatment: Yes Reason for Co-Treatment: For patient/therapist safety;To address functional/ADL transfers PT goals addressed during session: Mobility/safety with mobility OT goals addressed during session: ADL's and self-care      AM-PAC OT "6 Clicks" Daily Activity     Outcome Measure Help from another person eating meals?: A Little Help from another person taking care of personal grooming?: A Little Help from another person toileting, which includes using toliet, bedpan, or urinal?: A Little Help from another person bathing (including washing, rinsing, drying)?: A  Little Help from another person to put on and taking off regular upper body clothing?: A Little Help from another person to put on and taking off regular lower body clothing?: A Little 6 Click Score: 18   End of Session Equipment Utilized During Treatment: Rolling walker Nurse Communication: Mobility status  Activity Tolerance: Patient tolerated treatment well Patient left: in bed;with call bell/phone within reach;with family/visitor present  OT Visit Diagnosis: Other abnormalities of gait and mobility (R26.89);Unsteadiness on feet (R26.81);Muscle weakness (generalized) (M62.81)                Time: 9022-8406 OT Time Calculation (min): 48 min Charges:  OT General Charges $OT Visit: 1 Visit OT Evaluation $OT Eval Moderate Complexity: [place], OTR/L Acute Rehab Pager: [phone] Office: [place] 06/08/2021, 4:23 PM

## 2021-06-08 NOTE — Progress Notes (Signed)
Orthopedic Tech Progress Note Patient Details:  Joseph [name] 05-23-1956 [id number] Patient's wife stated he as unable to use crutches due to having a few amputated toes. Knee immobilizer was applied.  Ortho Devices Type of Ortho Device: Knee Immobilizer Ortho Device/Splint Location: Left knee Ortho Device/Splint Interventions: Application   Post Interventions Patient Tolerated: Well  Linus Salmons Jamiria Langill 06/08/2021, 11:48 AM

## 2021-06-08 NOTE — ED Triage Notes (Signed)
Patient BIB [facility] from home. Compliant of slip and fall in the shower this morning, upon EMS arrival left leg bent at 90 degree angle @ knee. Pt able to assist EMS with movement on scene. VSS. NAD. 50 mcg fentanyl via EMS.

## 2021-06-08 NOTE — Care Management (Signed)
ED Care Manager received message from [employer] that the wheelchair should be delivered shortly patient updated.  Verbalized understanding.

## 2021-06-08 NOTE — Care Management (Signed)
ED RN Care Manager met with patient and family at bedside to discuss recommendations for [facility] and w/c . Patient and family are agreeable.  Order placed with [facility] supply, awaiting delivery of w/c to ED.

## 2021-06-08 NOTE — Progress Notes (Signed)
CSW spoke with patient and wife at bedside. CSW explained that physical therapy will be coming in to make some recommendations. CSW asked patient and his wife if SNF is recommended is that something they would be interested in. Patient stated no and patients wife stated "No, I doubt it". CSW explained that home health could possibly be put in place but its based off of insurance and who will accept patient for those services. CSW also mentioned outpatient as well. Patients wife stated that her husband does not get out much and would prefer someone coming to the home. Patients wife stated her husband might need a wheelchair as well. CSW stated she would follow up with the provider and see what the best options will be. Patient will also have a difficult time getting SNF placement due to patients weight if family changes their mind. Patient weighed 470 lbs according to chart review last year and is possibly over 500 lbs now.

## 2021-06-08 NOTE — Evaluation (Signed)
Physical Therapy Evaluation Patient Details Name: Joseph [name]. MRN: [id number] DOB: Jul 25, 1956 Today's Date: 06/08/2021  History of Present Illness  Pt is 65 yo male s/p fall in shower with knee at awkward angle on 06/08/21.  Xray of knee and pelvis negative.  Pt with hx including but not limited to DVT, PE, morbid obesity, DM, bil great toe amputations, peripheral neuropathy.  Clinical Impression  Pt admitted with above diagnosis. At baseline, pt is limited ambulatory with rollator.  He requires frequent seated rest breaks.  At evaluation, Pt was able to transfer with min guard-min A level and ambulated 25' with RW.  His L knee with significant edema and ecchymosis but denied pain and was stable in weight bearing.  Pt with some limitations due to our equipment/set up is not ideal compared to home set up. He would benefit from bariatric w/c as he is not ambulating household distances and unable to ambulate far enough to use ramp.  Would also benefit from HHPT to advance mobility. Pt currently with functional limitations due to the deficits listed below (see PT Problem List). Pt will benefit from skilled PT to increase their independence and safety with mobility to allow discharge to the venue listed below.          Recommendations for follow up therapy are one component of a multi-disciplinary discharge planning process, led by the attending physician.  Recommendations may be updated based on patient status, additional functional criteria and insurance authorization.  Follow Up Recommendations Home health PT;Supervision for mobility/OOB    Equipment Recommendations  Other (comment) (baritric w/c)    Recommendations for Other Services       Precautions / Restrictions Precautions Precautions: Fall      Mobility  Bed Mobility Overal bed mobility: Needs Assistance Bed Mobility: Supine to Sit;Sit to Supine     Supine to sit: Min assist Sit to supine: Min assist   General bed  mobility comments: Pt needed hand to pull up on but was otherwise able to sit on his own; min A for L LE back to bed    Transfers Overall transfer level: Needs assistance Equipment used: Rolling walker (2 wheeled) Transfers: Sit to/from Stand Sit to Stand: Min guard         General transfer comment: Utilized bariatric RW; sit to stand x 3 throughout session (from stretcher and transport chair); therapist stabilizing RW but otherwise did not provide assist; at home pt has armrest that are better positioned and a grab bar at the bed  Ambulation/Gait Ambulation/Gait assistance: Min guard Gait Distance (Feet): 25 Feet Assistive device: Rolling walker (2 wheeled) Gait Pattern/deviations: Step-to pattern;Decreased stride length;Wide base of support Gait velocity: decreased   General Gait Details: Started with weight shifting and marching in place.  Pt did well so took a break and then walked 25' with chair follow.  Pt fatigued easily.  Did express not trusting his RIGHT knee b/c felt like it was having to handle more than it is used to.  Stairs            Wheelchair Mobility    Modified Rankin (Stroke Patients Only)       Balance Overall balance assessment: Needs assistance Sitting-balance support: No upper extremity supported Sitting balance-Leahy Scale: Good     Standing balance support: Bilateral upper extremity supported;No upper extremity supported Standing balance-Leahy Scale: Fair Standing balance comment: RW to ambulate but able to pull up underwear and shorts in standing  Pertinent Vitals/Pain Pain Assessment: No/denies pain    Home Living Family/patient expects to be discharged to:: Private residence Living Arrangements: Spouse/significant other Available Help at Discharge: Family;Available 24 hours/day Type of Home: House Home Access: Ramped entrance (ramp in questionable condition)     Home Layout: One level Home  Equipment: Clinical cytogeneticist - 4 wheels;Cane - single point Additional Comments: does have arm rest on side of toilet and grab bar at bed to pull up on; has a power chair but >41 years old and doesn't work    Prior Function Level of Independence: Needs assistance   Gait / Transfers Assistance Needed: ambulates with rollator in community; no AD in house but touches walls/furniture  ADL's / Homemaking Assistance Needed: Showers using shower chair; independent with dressing except socks  Comments: Other than this fall the last one was January     Hand Dominance        Extremity/Trunk Assessment   Upper Extremity Assessment Upper Extremity Assessment: Defer to OT evaluation    Lower Extremity Assessment Lower Extremity Assessment: LLE deficits/detail;RLE deficits/detail RLE Deficits / Details: ROM : hip limited by body habitus but WFL, full knee and ankle ROM; MMT: knee ext 5/5 and ankle DF 5/5, hip 3/5 not further tested RLE Sensation: decreased light touch;history of peripheral neuropathy (worse in feet but does have throughout lower leg) LLE Deficits / Details: ROM : hip limited by body habitus but WFL, full knee and ankle ROM; MMT: knee ext 5/5 and ankle DF 5/5, hip 3/5 not further tested; +ecchymosis, edema, tightness L knee LLE Sensation: decreased light touch;history of peripheral neuropathy (worse in feet but does have throughout lower leg)    Cervical / Trunk Assessment Cervical / Trunk Assessment: Other exceptions Cervical / Trunk Exceptions: morbid obesity; body habitus limits  Communication   Communication: No difficulties  Cognition Arousal/Alertness: Awake/alert Behavior During Therapy: WFL for tasks assessed/performed Overall Cognitive Status: Within Functional Limits for tasks assessed                                 General Comments: wife and daughter present      General Comments General comments (skin integrity, edema, etc.): VSS. Spoke with  pt, wife, daughter about how he did with therapy and any concerns about home. They have a ramp to enter home but wife reports is in questionable condition and too far for pt to walk in current condition - discussed options of EMS transport vs w/c. Interested in w/c as pt would benefit from it due to limited endurance baseline. NOtified case Freight forwarder. Wife expressed concern of could his L knee have an injury that's not boney and didn't show on xray. Discussed he didn't have pain and was stable on his knee but would notify MD of her concerns. Pt feels that he is not far from his baseline and comfortable returning home.  Also advised safe ice use and compression with ACE wraps for edema in L knee    Exercises     Assessment/Plan    PT Assessment Patient needs continued PT services  PT Problem List Decreased strength;Decreased mobility;Decreased safety awareness;Decreased range of motion;Obesity;Decreased activity tolerance;Decreased balance;Decreased knowledge of use of DME       PT Treatment Interventions DME instruction;Therapeutic activities;Gait training;Therapeutic exercise;Patient/family education;Balance training;Functional mobility training;Wheelchair mobility training;Modalities    PT Goals (Current goals can be found in the Care Plan section)  Acute Rehab PT Goals Patient  Stated Goal: return home PT Goal Formulation: With patient/family Time For Goal Achievement: 06/22/21 Potential to Achieve Goals: Good    Frequency Min 3X/week   Barriers to discharge Inaccessible home environment      Co-evaluation PT/OT/SLP Co-Evaluation/Treatment: Yes Reason for Co-Treatment: For patient/therapist safety (morbid obesity, knee injury, on ED stretcher) PT goals addressed during session: Mobility/safety with mobility         AM-PAC PT "6 Clicks" Mobility  Outcome Measure Help needed turning from your back to your side while in a flat bed without using bedrails?: A Little Help needed  moving from lying on your back to sitting on the side of a flat bed without using bedrails?: A Little Help needed moving to and from a bed to a chair (including a wheelchair)?: A Little Help needed standing up from a chair using your arms (e.g., wheelchair or bedside chair)?: A Little Help needed to walk in hospital room?: A Little Help needed climbing 3-5 steps with a railing? : Total 6 Click Score: 16    End of Session   Activity Tolerance: Patient tolerated treatment well Patient left: in bed;with call bell/phone within reach;with family/visitor present Nurse Communication: Mobility status (commincated mobility status, DME needs, and concerns with RN, SW, and MD via secure chat) PT Visit Diagnosis: Unsteadiness on feet (R26.81);Muscle weakness (generalized) (M62.81)    Time: 1441-1530 PT Time Calculation (min) (ACUTE ONLY): 49 min   Charges:   PT Evaluation $PT Eval Moderate Complexity: 1 Mod PT Treatments $Gait Training: 8-22 mins        Abran Richard, PT Acute Rehab Services Pager [phone] [facility] [place] 06/08/2021, 4:02 PM

## 2021-06-08 NOTE — ED Notes (Signed)
Knee immobilizer applied by [employer]. Crutches declined/ refused.

## 2021-06-08 NOTE — ED Notes (Signed)
Patient assisted to transfer to wheelchair and car at discharge.

## 2021-06-09 ENCOUNTER — Telehealth: Payer: Self-pay

## 2021-06-09 ENCOUNTER — Telehealth: Payer: Self-pay | Admitting: Surgery

## 2021-06-09 NOTE — Telephone Encounter (Addendum)
ED RNCM contacted patient's spouse to update her on attempts to secure [facility] service.  RN CM have sent referral to [facility], [facility], and [facility].  Both [facility] and [facility] declined the referral.  Spoke with [facility] who is reviewing the referral. Patient's spouse also provided a list of [place] companies that are within [facility] network but are the same [place] that referral was sent to.  Patient 's wife voiced being concerned about some care issues in the ED, ED RNCM referred her to ED Nursing Director  [facility] will  also send the referral to [facility] and will handoff for daytime ED CM  to follow up with her after speaking with [facility] in the am.   Addendum: Patient wife voice that she belives his leg was more swollen than yesterday and she believes it's worse" and verbalized not being able to get him up, This ED RN reiterated that the ED discharge instruction states that if the condition should worsen to return to the ED as written on the AVS.  Spouse then asked about wait time in the in waiting room, RNCM explained that I am I am from the transitions of Care, that would be determined by the ED policies

## 2021-06-09 NOTE — Telephone Encounter (Signed)
ED  RN Care Manager reached out to [facility], [facility] and [facility].  But they are unable to take [facility] at this time. ED RNCM will  update patient and family.

## 2021-06-09 NOTE — Telephone Encounter (Signed)
CSW spoke with patients wife to give her an update about the home health referrals. CSW explained that referrals were sent out. CSW explained unfortunately no agency at this time under patients insurance is willing to provide home health services due to patients current weight. CSW explained the agencies are not able to provide services safety. Patients wife got upset and stated patient was discharged too soon and she requested a MRI that was not completed. Patients wife believes that her husband did not receive proper care. Patients wife stated her daughter has already contacted an attorney. Patients wife feels her husband should have been admitted. CSW suggested she could try outpatient physical therapy to see if they can meet her husbands needs. CSW also stated she could check with her husbands PCP to see if they will refer him for a MRI. Patients wife was still not happy and CSW offered the patient experience number but patients wife stated she is sure the attorney will have that information.

## 2021-06-10 ENCOUNTER — Telehealth: Payer: Self-pay | Admitting: *Deleted

## 2021-06-10 NOTE — Telephone Encounter (Signed)
RNCM contacted patient's spouse to update her on attempts to secure [facility] service remain unsuccessful.

## 2021-06-11 ENCOUNTER — Emergency Department (HOSPITAL_COMMUNITY): Payer: HMO

## 2021-06-11 ENCOUNTER — Telehealth: Payer: Self-pay | Admitting: Surgery

## 2021-06-11 ENCOUNTER — Inpatient Hospital Stay (HOSPITAL_COMMUNITY)
Admission: EM | Admit: 2021-06-11 | Discharge: 2021-07-31 | DRG: 463 | Disposition: A | Discharge status: Hospice | Payer: HMO | Attending: Internal Medicine | Admitting: Internal Medicine

## 2021-06-11 ENCOUNTER — Encounter (HOSPITAL_COMMUNITY): Payer: Self-pay | Admitting: Internal Medicine

## 2021-06-11 DIAGNOSIS — E8729 Other acidosis: Secondary | ICD-10-CM | POA: Diagnosis not present

## 2021-06-11 DIAGNOSIS — E8809 Other disorders of plasma-protein metabolism, not elsewhere classified: Secondary | ICD-10-CM | POA: Diagnosis not present

## 2021-06-11 DIAGNOSIS — Z6841 Body Mass Index (BMI) 40.0 and over, adult: Secondary | ICD-10-CM

## 2021-06-11 DIAGNOSIS — N179 Acute kidney failure, unspecified: Secondary | ICD-10-CM

## 2021-06-11 DIAGNOSIS — G9341 Metabolic encephalopathy: Secondary | ICD-10-CM | POA: Diagnosis not present

## 2021-06-11 DIAGNOSIS — L89150 Pressure ulcer of sacral region, unstageable: Secondary | ICD-10-CM | POA: Diagnosis present

## 2021-06-11 DIAGNOSIS — E11649 Type 2 diabetes mellitus with hypoglycemia without coma: Secondary | ICD-10-CM | POA: Diagnosis present

## 2021-06-11 DIAGNOSIS — D649 Anemia, unspecified: Secondary | ICD-10-CM | POA: Diagnosis present

## 2021-06-11 DIAGNOSIS — S83105A Unspecified dislocation of left knee, initial encounter: Principal | ICD-10-CM | POA: Diagnosis present

## 2021-06-11 DIAGNOSIS — K219 Gastro-esophageal reflux disease without esophagitis: Secondary | ICD-10-CM | POA: Diagnosis present

## 2021-06-11 DIAGNOSIS — E1142 Type 2 diabetes mellitus with diabetic polyneuropathy: Secondary | ICD-10-CM | POA: Diagnosis present

## 2021-06-11 DIAGNOSIS — I872 Venous insufficiency (chronic) (peripheral): Secondary | ICD-10-CM | POA: Diagnosis present

## 2021-06-11 DIAGNOSIS — E1165 Type 2 diabetes mellitus with hyperglycemia: Secondary | ICD-10-CM | POA: Diagnosis present

## 2021-06-11 DIAGNOSIS — R001 Bradycardia, unspecified: Secondary | ICD-10-CM | POA: Diagnosis present

## 2021-06-11 DIAGNOSIS — F4323 Adjustment disorder with mixed anxiety and depressed mood: Secondary | ICD-10-CM | POA: Diagnosis present

## 2021-06-11 DIAGNOSIS — M25462 Effusion, left knee: Secondary | ICD-10-CM

## 2021-06-11 DIAGNOSIS — S8012XA Contusion of left lower leg, initial encounter: Secondary | ICD-10-CM | POA: Diagnosis present

## 2021-06-11 DIAGNOSIS — N183 Chronic kidney disease, stage 3 unspecified: Secondary | ICD-10-CM | POA: Diagnosis present

## 2021-06-11 DIAGNOSIS — Z86718 Personal history of other venous thrombosis and embolism: Secondary | ICD-10-CM

## 2021-06-11 DIAGNOSIS — T40605A Adverse effect of unspecified narcotics, initial encounter: Secondary | ICD-10-CM | POA: Diagnosis not present

## 2021-06-11 DIAGNOSIS — Z7189 Other specified counseling: Secondary | ICD-10-CM

## 2021-06-11 DIAGNOSIS — I10 Essential (primary) hypertension: Secondary | ICD-10-CM | POA: Diagnosis present

## 2021-06-11 DIAGNOSIS — W06XXXA Fall from bed, initial encounter: Secondary | ICD-10-CM | POA: Diagnosis present

## 2021-06-11 DIAGNOSIS — I130 Hypertensive heart and chronic kidney disease with heart failure and stage 1 through stage 4 chronic kidney disease, or unspecified chronic kidney disease: Secondary | ICD-10-CM | POA: Diagnosis present

## 2021-06-11 DIAGNOSIS — I472 Ventricular tachycardia, unspecified: Secondary | ICD-10-CM | POA: Diagnosis not present

## 2021-06-11 DIAGNOSIS — IMO0002 Reserved for concepts with insufficient information to code with codable children: Secondary | ICD-10-CM | POA: Diagnosis present

## 2021-06-11 DIAGNOSIS — Z66 Do not resuscitate: Secondary | ICD-10-CM | POA: Diagnosis present

## 2021-06-11 DIAGNOSIS — Z89422 Acquired absence of other left toe(s): Secondary | ICD-10-CM

## 2021-06-11 DIAGNOSIS — Z992 Dependence on renal dialysis: Secondary | ICD-10-CM

## 2021-06-11 DIAGNOSIS — Z86711 Personal history of pulmonary embolism: Secondary | ICD-10-CM

## 2021-06-11 DIAGNOSIS — Z7984 Long term (current) use of oral hypoglycemic drugs: Secondary | ICD-10-CM

## 2021-06-11 DIAGNOSIS — Z7901 Long term (current) use of anticoagulants: Secondary | ICD-10-CM

## 2021-06-11 DIAGNOSIS — G4733 Obstructive sleep apnea (adult) (pediatric): Secondary | ICD-10-CM | POA: Diagnosis present

## 2021-06-11 DIAGNOSIS — T1490XA Injury, unspecified, initial encounter: Secondary | ICD-10-CM | POA: Diagnosis not present

## 2021-06-11 DIAGNOSIS — Z794 Long term (current) use of insulin: Secondary | ICD-10-CM

## 2021-06-11 DIAGNOSIS — E1151 Type 2 diabetes mellitus with diabetic peripheral angiopathy without gangrene: Secondary | ICD-10-CM | POA: Diagnosis present

## 2021-06-11 DIAGNOSIS — I2510 Atherosclerotic heart disease of native coronary artery without angina pectoris: Secondary | ICD-10-CM | POA: Diagnosis present

## 2021-06-11 DIAGNOSIS — E669 Obesity, unspecified: Secondary | ICD-10-CM | POA: Diagnosis present

## 2021-06-11 DIAGNOSIS — D631 Anemia in chronic kidney disease: Secondary | ICD-10-CM | POA: Diagnosis present

## 2021-06-11 DIAGNOSIS — Z8249 Family history of ischemic heart disease and other diseases of the circulatory system: Secondary | ICD-10-CM

## 2021-06-11 DIAGNOSIS — F331 Major depressive disorder, recurrent, moderate: Secondary | ICD-10-CM

## 2021-06-11 DIAGNOSIS — Z20822 Contact with and (suspected) exposure to covid-19: Secondary | ICD-10-CM | POA: Diagnosis present

## 2021-06-11 DIAGNOSIS — Z9189 Other specified personal risk factors, not elsewhere classified: Secondary | ICD-10-CM

## 2021-06-11 DIAGNOSIS — R296 Repeated falls: Secondary | ICD-10-CM | POA: Diagnosis present

## 2021-06-11 DIAGNOSIS — T45515A Adverse effect of anticoagulants, initial encounter: Secondary | ICD-10-CM | POA: Diagnosis present

## 2021-06-11 DIAGNOSIS — Z9181 History of falling: Secondary | ICD-10-CM

## 2021-06-11 DIAGNOSIS — L97824 Non-pressure chronic ulcer of other part of left lower leg with necrosis of bone: Secondary | ICD-10-CM

## 2021-06-11 DIAGNOSIS — Y92009 Unspecified place in unspecified non-institutional (private) residence as the place of occurrence of the external cause: Secondary | ICD-10-CM

## 2021-06-11 DIAGNOSIS — L97129 Non-pressure chronic ulcer of left thigh with unspecified severity: Secondary | ICD-10-CM | POA: Diagnosis present

## 2021-06-11 DIAGNOSIS — I700 Atherosclerosis of aorta: Secondary | ICD-10-CM | POA: Diagnosis present

## 2021-06-11 DIAGNOSIS — D62 Acute posthemorrhagic anemia: Secondary | ICD-10-CM | POA: Diagnosis not present

## 2021-06-11 DIAGNOSIS — T148XXA Other injury of unspecified body region, initial encounter: Secondary | ICD-10-CM

## 2021-06-11 DIAGNOSIS — J9622 Acute and chronic respiratory failure with hypercapnia: Secondary | ICD-10-CM | POA: Diagnosis not present

## 2021-06-11 DIAGNOSIS — E7800 Pure hypercholesterolemia, unspecified: Secondary | ICD-10-CM | POA: Diagnosis present

## 2021-06-11 DIAGNOSIS — S81002D Unspecified open wound, left knee, subsequent encounter: Secondary | ICD-10-CM

## 2021-06-11 DIAGNOSIS — I5032 Chronic diastolic (congestive) heart failure: Secondary | ICD-10-CM | POA: Diagnosis present

## 2021-06-11 DIAGNOSIS — L8990 Pressure ulcer of unspecified site, unspecified stage: Secondary | ICD-10-CM | POA: Insufficient documentation

## 2021-06-11 DIAGNOSIS — J9621 Acute and chronic respiratory failure with hypoxia: Secondary | ICD-10-CM | POA: Diagnosis not present

## 2021-06-11 DIAGNOSIS — Z515 Encounter for palliative care: Secondary | ICD-10-CM

## 2021-06-11 DIAGNOSIS — Z419 Encounter for procedure for purposes other than remedying health state, unspecified: Secondary | ICD-10-CM

## 2021-06-11 DIAGNOSIS — N1831 Chronic kidney disease, stage 3a: Secondary | ICD-10-CM | POA: Diagnosis present

## 2021-06-11 DIAGNOSIS — Z79899 Other long term (current) drug therapy: Secondary | ICD-10-CM

## 2021-06-11 DIAGNOSIS — R0602 Shortness of breath: Secondary | ICD-10-CM

## 2021-06-11 DIAGNOSIS — Z89421 Acquired absence of other right toe(s): Secondary | ICD-10-CM

## 2021-06-11 DIAGNOSIS — R791 Abnormal coagulation profile: Secondary | ICD-10-CM

## 2021-06-11 DIAGNOSIS — I440 Atrioventricular block, first degree: Secondary | ICD-10-CM | POA: Diagnosis present

## 2021-06-11 DIAGNOSIS — K5909 Other constipation: Secondary | ICD-10-CM | POA: Diagnosis present

## 2021-06-11 DIAGNOSIS — S83016A Lateral dislocation of unspecified patella, initial encounter: Secondary | ICD-10-CM | POA: Diagnosis present

## 2021-06-11 DIAGNOSIS — E1140 Type 2 diabetes mellitus with diabetic neuropathy, unspecified: Secondary | ICD-10-CM | POA: Diagnosis present

## 2021-06-11 DIAGNOSIS — E1122 Type 2 diabetes mellitus with diabetic chronic kidney disease: Secondary | ICD-10-CM | POA: Diagnosis present

## 2021-06-11 DIAGNOSIS — E875 Hyperkalemia: Secondary | ICD-10-CM | POA: Diagnosis present

## 2021-06-11 DIAGNOSIS — M79606 Pain in leg, unspecified: Secondary | ICD-10-CM

## 2021-06-11 DIAGNOSIS — N1832 Chronic kidney disease, stage 3b: Secondary | ICD-10-CM | POA: Diagnosis present

## 2021-06-11 DIAGNOSIS — S8002XA Contusion of left knee, initial encounter: Secondary | ICD-10-CM | POA: Diagnosis present

## 2021-06-11 DIAGNOSIS — W19XXXA Unspecified fall, initial encounter: Secondary | ICD-10-CM

## 2021-06-11 DIAGNOSIS — Z91048 Other nonmedicinal substance allergy status: Secondary | ICD-10-CM

## 2021-06-11 LAB — PROTIME-INR
INR: 6.2 (ref 0.8–1.2)
Prothrombin Time: 54.7 seconds — ABNORMAL HIGH (ref 11.4–15.2)

## 2021-06-11 LAB — I-STAT CHEM 8, ED
BUN: 41 mg/dL — ABNORMAL HIGH (ref 8–23)
Calcium, Ion: 1.09 mmol/L — ABNORMAL LOW (ref 1.15–1.40)
Chloride: 104 mmol/L (ref 98–111)
Creatinine, Ser: 1.5 mg/dL — ABNORMAL HIGH (ref 0.61–1.24)
Glucose, Bld: 176 mg/dL — ABNORMAL HIGH (ref 70–99)
HCT: 29 % — ABNORMAL LOW (ref 39.0–52.0)
Hemoglobin: 9.9 g/dL — ABNORMAL LOW (ref 13.0–17.0)
Potassium: 4.2 mmol/L (ref 3.5–5.1)
Sodium: 137 mmol/L (ref 135–145)
TCO2: 23 mmol/L (ref 22–32)

## 2021-06-11 LAB — CBC WITH DIFFERENTIAL/PLATELET
Abs Immature Granulocytes: 0.05 10*3/uL (ref 0.00–0.07)
Basophils Absolute: 0.1 10*3/uL (ref 0.0–0.1)
Basophils Relative: 1 %
Eosinophils Absolute: 0.4 10*3/uL (ref 0.0–0.5)
Eosinophils Relative: 5 %
HCT: 32.2 % — ABNORMAL LOW (ref 39.0–52.0)
Hemoglobin: 9.9 g/dL — ABNORMAL LOW (ref 13.0–17.0)
Immature Granulocytes: 1 %
Lymphocytes Relative: 22 %
Lymphs Abs: 1.8 10*3/uL (ref 0.7–4.0)
MCH: 28.5 pg (ref 26.0–34.0)
MCHC: 30.7 g/dL (ref 30.0–36.0)
MCV: 92.8 fL (ref 80.0–100.0)
Monocytes Absolute: 0.7 10*3/uL (ref 0.1–1.0)
Monocytes Relative: 8 %
Neutro Abs: 5.1 10*3/uL (ref 1.7–7.7)
Neutrophils Relative %: 63 %
Platelets: 250 10*3/uL (ref 150–400)
RBC: 3.47 MIL/uL — ABNORMAL LOW (ref 4.22–5.81)
RDW: 15.9 % — ABNORMAL HIGH (ref 11.5–15.5)
WBC: 8 10*3/uL (ref 4.0–10.5)
nRBC: 0 % (ref 0.0–0.2)

## 2021-06-11 LAB — CK: Total CK: 618 U/L — ABNORMAL HIGH (ref 49–397)

## 2021-06-11 LAB — COMPREHENSIVE METABOLIC PANEL
ALT: 18 U/L (ref 0–44)
AST: 29 U/L (ref 15–41)
Albumin: 2.8 g/dL — ABNORMAL LOW (ref 3.5–5.0)
Alkaline Phosphatase: 68 U/L (ref 38–126)
Anion gap: 11 (ref 5–15)
BUN: 49 mg/dL — ABNORMAL HIGH (ref 8–23)
CO2: 23 mmol/L (ref 22–32)
Calcium: 8.1 mg/dL — ABNORMAL LOW (ref 8.9–10.3)
Chloride: 101 mmol/L (ref 98–111)
Creatinine, Ser: 1.52 mg/dL — ABNORMAL HIGH (ref 0.61–1.24)
GFR, Estimated: 51 mL/min — ABNORMAL LOW (ref >60)
Glucose, Bld: 184 mg/dL — ABNORMAL HIGH (ref 70–99)
Potassium: 4.2 mmol/L (ref 3.5–5.1)
Sodium: 135 mmol/L (ref 135–145)
Total Bilirubin: 0.5 mg/dL (ref 0.3–1.2)
Total Protein: 6 g/dL — ABNORMAL LOW (ref 6.5–8.1)

## 2021-06-11 LAB — RESP PANEL BY RT-PCR (FLU A&B, COVID) ARPGX2
Influenza A by PCR: NEGATIVE
Influenza B by PCR: NEGATIVE
SARS Coronavirus 2 by RT PCR: NEGATIVE

## 2021-06-11 LAB — CBG MONITORING, ED: Glucose-Capillary: 177 mg/dL — ABNORMAL HIGH (ref 70–99)

## 2021-06-11 MED ORDER — MORPHINE SULFATE (PF) 4 MG/ML IV SOLN: 4.0000 mg | Freq: Once | INTRAVENOUS | Status: DC

## 2021-06-11 MED ORDER — PROPOFOL 10 MG/ML IV BOLUS
INTRAVENOUS | Status: AC | PRN
  Administered 2021-06-11 (×2): 50 mg via INTRAVENOUS

## 2021-06-11 MED ORDER — OXYCODONE-ACETAMINOPHEN 5-325 MG PO TABS
2.0000 | ORAL_TABLET | Freq: Once | ORAL | Status: AC
  Administered 2021-06-11: 2 via ORAL
  Filled 2021-06-11: qty 2

## 2021-06-11 MED ORDER — LACTATED RINGERS IV SOLN: INTRAVENOUS | Status: DC

## 2021-06-11 MED ORDER — SODIUM CHLORIDE 0.9 % IV SOLN
10.0000 mL/h | Freq: Once | INTRAVENOUS | Status: AC
Start: 2021-06-11 — End: 2021-06-13
  Administered 2021-06-13: 10 mL/h via INTRAVENOUS

## 2021-06-11 MED ORDER — PANTOPRAZOLE SODIUM 40 MG PO TBEC
40.0000 mg | DELAYED_RELEASE_TABLET | Freq: Every day | ORAL | Status: DC
  Administered 2021-06-12 – 2021-07-10 (×28): 40 mg via ORAL
  Filled 2021-06-11 (×29): qty 1

## 2021-06-11 MED ORDER — SILVER SULFADIAZINE 1 % EX CREA
1.0000 "application " | TOPICAL_CREAM | Freq: Every day | CUTANEOUS | Status: DC | PRN
  Administered 2021-06-13 – 2021-06-15 (×2): 1 via TOPICAL
  Filled 2021-06-11: qty 50

## 2021-06-11 MED ORDER — INSULIN ASPART 100 UNIT/ML IJ SOLN
0.0000 [IU] | Freq: Three times a day (TID) | INTRAMUSCULAR | Status: DC
  Administered 2021-06-13: 7 [IU] via SUBCUTANEOUS
  Administered 2021-06-13: 3 [IU] via SUBCUTANEOUS
  Administered 2021-06-13: 4 [IU] via SUBCUTANEOUS
  Administered 2021-06-14: 7 [IU] via SUBCUTANEOUS
  Administered 2021-06-14 – 2021-06-15 (×3): 4 [IU] via SUBCUTANEOUS
  Administered 2021-06-16 – 2021-06-17 (×4): 3 [IU] via SUBCUTANEOUS
  Administered 2021-06-17: 4 [IU] via SUBCUTANEOUS
  Administered 2021-06-18: 11 [IU] via SUBCUTANEOUS
  Filled 2021-06-11: qty 0.2

## 2021-06-11 MED ORDER — PHYTONADIONE 5 MG PO TABS
2.5000 mg | ORAL_TABLET | Freq: Once | ORAL | Status: AC
  Administered 2021-06-11: 2.5 mg via ORAL
  Filled 2021-06-11: qty 1

## 2021-06-11 MED ORDER — PROPOFOL 10 MG/ML IV BOLUS
200.0000 mg | Freq: Once | INTRAVENOUS | Status: DC
  Filled 2021-06-11: qty 20

## 2021-06-11 MED ORDER — HYDROMORPHONE HCL 1 MG/ML IJ SOLN
1.0000 mg | Freq: Once | INTRAMUSCULAR | Status: AC
  Administered 2021-06-11: 1 mg via INTRAVENOUS
  Filled 2021-06-11: qty 1

## 2021-06-11 MED ORDER — HYDROMORPHONE HCL 1 MG/ML IJ SOLN
1.0000 mg | INTRAMUSCULAR | Status: DC | PRN
  Administered 2021-06-11 – 2021-06-24 (×16): 1 mg via INTRAVENOUS
  Filled 2021-06-11 (×16): qty 1

## 2021-06-11 MED ORDER — ACETAMINOPHEN 500 MG PO TABS
1000.0000 mg | ORAL_TABLET | Freq: Four times a day (QID) | ORAL | Status: DC | PRN
  Administered 2021-06-13 – 2021-07-27 (×25): 1000 mg via ORAL
  Filled 2021-06-11 (×27): qty 2

## 2021-06-11 MED ORDER — INSULIN GLARGINE-YFGN 100 UNIT/ML ~~LOC~~ SOLN
50.0000 [IU] | Freq: Every day | SUBCUTANEOUS | Status: DC
  Administered 2021-06-11 – 2021-06-30 (×20): 50 [IU] via SUBCUTANEOUS
  Filled 2021-06-11 (×21): qty 0.5

## 2021-06-11 MED ORDER — LISINOPRIL 20 MG PO TABS
40.0000 mg | ORAL_TABLET | Freq: Every day | ORAL | Status: DC
  Administered 2021-06-12: 40 mg via ORAL
  Filled 2021-06-11 (×2): qty 2

## 2021-06-11 MED ORDER — FLUOXETINE HCL 20 MG PO CAPS
40.0000 mg | ORAL_CAPSULE | Freq: Every evening | ORAL | Status: DC
  Administered 2021-06-11 – 2021-07-14 (×33): 40 mg via ORAL
  Filled 2021-06-11 (×33): qty 2

## 2021-06-11 MED ORDER — ONDANSETRON HCL 4 MG PO TABS: 4.0000 mg | ORAL_TABLET | Freq: Four times a day (QID) | ORAL | Status: DC | PRN

## 2021-06-11 MED ORDER — ONDANSETRON HCL 4 MG/2ML IJ SOLN
4.0000 mg | Freq: Four times a day (QID) | INTRAMUSCULAR | Status: DC | PRN
  Administered 2021-06-16 – 2021-06-21 (×2): 4 mg via INTRAVENOUS
  Filled 2021-06-11 (×4): qty 2

## 2021-06-11 MED ORDER — OXYCODONE HCL 5 MG PO TABS
10.0000 mg | ORAL_TABLET | Freq: Four times a day (QID) | ORAL | Status: DC | PRN
  Administered 2021-06-11 – 2021-06-24 (×23): 10 mg via ORAL
  Filled 2021-06-11 (×24): qty 2

## 2021-06-11 MED ORDER — IOHEXOL 350 MG/ML SOLN
100.0000 mL | Freq: Once | INTRAVENOUS | Status: AC | PRN
  Administered 2021-06-11: 100 mL via INTRAVENOUS

## 2021-06-11 MED ORDER — METFORMIN HCL 500 MG PO TABS: 1000.0000 mg | ORAL_TABLET | Freq: Two times a day (BID) | ORAL | Status: DC

## 2021-06-11 MED ORDER — METHOCARBAMOL 500 MG PO TABS
1000.0000 mg | ORAL_TABLET | Freq: Two times a day (BID) | ORAL | Status: DC
  Administered 2021-06-11 – 2021-07-31 (×94): 1000 mg via ORAL
  Filled 2021-06-11 (×96): qty 2

## 2021-06-11 MED ORDER — INDAPAMIDE 1.25 MG PO TABS
5.0000 mg | ORAL_TABLET | Freq: Every day | ORAL | Status: DC
  Administered 2021-06-12: 5 mg via ORAL
  Filled 2021-06-11: qty 4

## 2021-06-11 MED ORDER — PIOGLITAZONE HCL 15 MG PO TABS
45.0000 mg | ORAL_TABLET | Freq: Every day | ORAL | Status: DC
  Administered 2021-06-13 – 2021-07-28 (×45): 45 mg via ORAL
  Filled 2021-06-11 (×23): qty 3
  Filled 2021-06-11: qty 1
  Filled 2021-06-11 (×19): qty 3
  Filled 2021-06-11: qty 1
  Filled 2021-06-11 (×7): qty 3

## 2021-06-11 MED ORDER — PRAVASTATIN SODIUM 20 MG PO TABS
40.0000 mg | ORAL_TABLET | Freq: Every evening | ORAL | Status: DC
  Administered 2021-06-11: 40 mg via ORAL
  Filled 2021-06-11: qty 2

## 2021-06-11 MED ORDER — CEPHALEXIN 250 MG PO CAPS
250.0000 mg | ORAL_CAPSULE | Freq: Every day | ORAL | Status: DC
  Administered 2021-06-11 – 2021-06-30 (×20): 250 mg via ORAL
  Filled 2021-06-11 (×20): qty 1

## 2021-06-11 MED ORDER — FUROSEMIDE 40 MG PO TABS
40.0000 mg | ORAL_TABLET | Freq: Every day | ORAL | Status: DC
  Administered 2021-06-12: 40 mg via ORAL
  Filled 2021-06-11: qty 1

## 2021-06-11 MED ORDER — AMLODIPINE BESYLATE 10 MG PO TABS
10.0000 mg | ORAL_TABLET | Freq: Every evening | ORAL | Status: DC
  Administered 2021-06-11 – 2021-06-12 (×2): 10 mg via ORAL
  Filled 2021-06-11: qty 2
  Filled 2021-06-11: qty 1

## 2021-06-11 MED ORDER — ACETAMINOPHEN 650 MG RE SUPP: 650.0000 mg | RECTAL | Status: DC | PRN

## 2021-06-11 MED ORDER — PROPOFOL 10 MG/ML IV BOLUS
INTRAVENOUS | Status: AC
  Filled 2021-06-11: qty 20

## 2021-06-11 MED ORDER — TAMSULOSIN HCL 0.4 MG PO CAPS
0.4000 mg | ORAL_CAPSULE | Freq: Every day | ORAL | Status: DC
  Administered 2021-06-12 – 2021-07-21 (×39): 0.4 mg via ORAL
  Filled 2021-06-11 (×39): qty 1

## 2021-06-11 MED ORDER — QUINAPRIL HCL 10 MG PO TABS: 40.0000 mg | ORAL_TABLET | Freq: Every day | ORAL | Status: DC

## 2021-06-11 NOTE — ED Triage Notes (Signed)
BIBA Per EMS: Pt coming from home w/ c/o swelling and pain in left leg in calf area after a fall that occurred on Monday. Pt is taking coumadin at home. Pt was cleared from fall at [facility]. A&Ox4; Vitals WDL. Hx DVT

## 2021-06-11 NOTE — H&P (Signed)
History and Physical    Joseph [name]. YQI:[id number] DOB: 05-28-1956 DOA: 06/11/2021  PCP: Donald Prose, MD  Patient coming from: Home.  I have personally briefly reviewed patient's old medical records in [place]  Chief Complaint: Fall.  HPI: Joseph [name]. is a 65 y.o. male with medical history significant of anxiety, depression, history of left knee DVT, history of pulmonary embolism on warfarin, GERD, essential hypertension, iron deficiency anemia, unspecified myalgias and myositis, OSA on CPAP, peripheral vascular disease, diabetic polyneuropathy, type II DM, pure hypercholesterolemia, history of multiple falls who came into the emergency department after having a fall at home on Monday which resulted in his knee dislocating.  While the EMS crew was transporting him they pulled on his left leg and the dislocation got reduced.  However, while he was radiology earlier today, he fell dislocating his knee again.  He required conscious sedation while orthopedic surgery reduced the dislocation again.  These falls have been accidental with no prodromal symptoms as he has felt that his knee locked and gave out.  He denied fever, chills, rhinorrhea, sore throat, dyspnea, wheezing or hemoptysis.  He has had some postural dizziness when getting out of bed, but denied chest pain, palpitations, diaphoresis, PND or orthopnea.  He occasionally gets lower extremity pitting edema, has chronic lymphedema and venous stasis ulcers but no significant pitting edema lately.  He has occasional diarrhea, but no abdominal pain, nausea, emesis, constipation, melena or hematochezia.  No flank pain, dysuria, frequency or hematuria.  No polyuria, polydipsia, polyphagia or blurred vision.  ED Course: Initial vital signs were temperature 97.8 F, pulse 81, respiration 18, BP 178/55 mmHg O2 sat 98% on room air.  The patient received hydromorphone 1 mg IVP x2, 1 tablet of Percocet 5/325 mg, vitamin K, fresh frozen  plasma and underwent conscious sedation for left knee joint dislocation reduction.  Lab work: CBC showed a white count of 8.0, hemoglobin 9.9 g/dL platelets 250.  PT was 54.7 and INR 6.2.  His hemoglobin is 2.8 g lower than 3 days ago.  Total CK6 118 units/L.  CMP showed normal electrolytes when calcium is corrected to albumin.  Glucose 184, BUN 49 and creatinine 1.52 mg/dL.  Total protein was 6.0 and albumin 2.8 g/dL.  Imaging: Lumbar spine x-ray showed multilevel DJD changes but no acute abnormality.  Pelvics x-ray were negative.  Left knee/tibia and fibula x-rays showed lateral dislocation of the knee with several fracture fragments likely originating from the proximal tibia.  Left femur x-ray was negative.  CT abdomen/pelvis with contrast was degraded due to patient's body habitus.  Given this limitation, no acute or posttraumatic deformity was identified.  There was trace left pleural fluid.  Coronary artery and aortic atherosclerosis.  Please see images and full radiology report for further detail.  Review of Systems: As per HPI otherwise all other systems reviewed and are negative.  Past Medical History:  Diagnosis Date   Anxiety state, unspecified    Depression    DVT (deep venous thrombosis) ([place]) 2004   Left knee   Edema    Esophageal reflux    occ   Essential hypertension, benign    Essential hypertension, benign    Falls frequently    History of iron deficiency anemia    Morbid obesity (HCC)    Myalgia and myositis, unspecified    BACK AND LEGS   OSA on CPAP    Peripheral vascular disease, unspecified ([place])    Personal  history of PE (pulmonary embolism) 2004   Polyneuropathy in diabetes(357.2)    Poor venous access    PT STATES DIFFICULT TO DRAW BLOOD FROM HIS VEINS   Pure hypercholesterolemia    Shortness of breath    DUE TO WEIGHT AND LOSS OF MOBILITY   Type II or unspecified type diabetes mellitus with neurological manifestations, not stated as uncontrolled(250.60)     Type II or unspecified type diabetes mellitus without mention of complication, not stated as uncontrolled    Type II or unspecified type diabetes mellitus without mention of complication, uncontrolled    Varicose veins of lower extremities with inflammation    Wears glasses    Wound infection after surgery    required hospitalization    Past Surgical History:  Procedure Laterality Date   AMPUTATION TOE Bilateral 04/03/2020   Procedure: BILATERAL HALLUX AMPUTATION;  Surgeon: Felipa Furnace, DPM;  Location: [facility];  Service: Podiatry;  Laterality: Bilateral;   COLONOSCOPY WITH PROPOFOL N/A 07/20/2019   Procedure: COLONOSCOPY WITH PROPOFOL;  Surgeon: Wonda Horner, MD;  Location: [facility];  Service: Endoscopy;  Laterality: N/A;   I & D EXTREMITY Bilateral 04/05/2020   Procedure: IRRIGATION AND DEBRIDEMENT EXTREMITY WITH CLOSURE - BILATERALLY;  Surgeon: Felipa Furnace, DPM;  Location: [facility];  Service: Podiatry;  Laterality: Bilateral;   NO PAST SURGERIES     PANNICULECTOMY N/A 07/15/2014   Procedure: PANNICULECTOMY;  Surgeon: Pedro Earls, MD;  Location: [facility];  Service: General;  Laterality: N/A;   POLYPECTOMY  07/20/2019   Procedure: POLYPECTOMY;  Surgeon: Wonda Horner, MD;  Location: [facility];  Service: Endoscopy;;    Social History  reports that he has never smoked. He has never used smokeless tobacco. He reports that he does not currently use alcohol. He reports that he does not use drugs.  Allergies  Allergen Reactions   Tape     Peels skin on legs   Chlorhexidine Rash    Wipes   Family medical history Several family members with hypertension.  Prior to Admission medications   Medication Sig Start Date End Date Taking? Authorizing Provider  acetaminophen (TYLENOL) 500 MG tablet Take 1,000 mg by mouth daily as needed for moderate pain or headache.    Yes [provider]  amLODipine (NORVASC) 10 MG tablet Take 10 mg by mouth every evening.    Yes  [provider]  cephALEXin (KEFLEX) 250 MG capsule Take 250 mg by mouth at bedtime. 06/05/21  Yes [provider]  FLUoxetine (PROZAC) 40 MG capsule Take 40 mg by mouth every evening.    Yes [provider]  furosemide (LASIX) 40 MG tablet Take 40 mg by mouth daily.    Yes [provider]  indapamide (LOZOL) 2.5 MG tablet Take 5 mg by mouth daily.   Yes [provider]  insulin aspart (NOVOLOG) 100 UNIT/ML injection Inject 0-25 Units into the skin as needed for high blood sugar. Sliding Scale 06/08/21  Yes [provider]  LANTUS 100 UNIT/ML injection Inject 50 Units into the skin at bedtime. 06/08/21  Yes [provider]  metFORMIN (GLUCOPHAGE) 1000 MG tablet Take 1,000 mg by mouth 2 (two) times daily with a meal.   Yes [provider]  methocarbamol (ROBAXIN) 500 MG tablet Take 2 tablets (1,000 mg total) by mouth 2 (two) times daily for 7 days. 06/08/21 06/15/21 Yes Jeanell Sparrow, DO  omeprazole (PRILOSEC) 40 MG capsule Take 40 mg by mouth daily  as needed (acid reflux).    Yes [provider]  pioglitazone (ACTOS) 45 MG tablet Take 45 mg by mouth daily.   Yes [provider]  pravastatin (PRAVACHOL) 40 MG tablet Take 40 mg by mouth every evening.    Yes [provider]  Prenatal Vit-Fe Fumarate-FA (PRENATAL FA PO) Take 1 tablet by mouth daily.   Yes [provider]  quinapril (ACCUPRIL) 40 MG tablet Take 40 mg by mouth daily.    Yes [provider]  silver sulfADIAZINE (SILVADENE) 1 % cream Apply 1 application topically daily. Patient taking differently: Apply 1 application topically daily as needed (wounds on toes and legs). 02/26/20  Yes Early, Arvilla Meres, MD  tamsulosin (FLOMAX) 0.4 MG CAPS capsule Take 0.4 mg by mouth daily. 04/02/21  Yes [provider]  warfarin (COUMADIN) 5 MG tablet Take 5-7.5 mg by mouth See admin instructions. Take 5 mg in the evening on Sun, Mon, Wed, and  Fri. Take 7.5 mg in the evening on Tue, Thur, and Sat   Yes [provider]  Wheat Dextrin (BENEFIBER) POWD Take 15 mLs by mouth daily as needed (constipation). Mix in coffee and drink   Yes [provider]  acetaminophen (TYLENOL) 325 MG tablet Take 2 tablets (650 mg total) by mouth every 6 (six) hours as needed. Patient not taking: Reported on 06/11/2021 06/08/21   Wynona Dove A, DO  Continuous Blood Gluc Sensor (FREESTYLE LIBRE 2 SENSOR) MISC APPLY EVERY 14 DAYS AS DIRECTED 03/28/20   [provider]  FREESTYLE LITE test strip TEST BLOOD SUGAR FOUR TIMES DAILY 02/11/20   [provider]   Physical Exam: Vitals:   06/11/21 2046 06/11/21 2050 06/11/21 2100 06/11/21 2115  BP: (!) 177/52 (!) 184/48 (!) 186/51 (!) 189/50  Pulse: 81 81 80 83  Resp: 14 17 16 16   Temp:      TempSrc:      SpO2: 99% 94% 94% 96%  Weight:      Height:       Constitutional: NAD, calm, comfortable Eyes: PERRL, lids and conjunctivae normal ENMT: Mucous membranes are moist. Posterior pharynx clear of any exudate or lesions. Neck: normal, supple, no masses, no thyromegaly Respiratory: clear to auscultation bilaterally, no wheezing, no crackles. Normal respiratory effort. No accessory muscle use.  Cardiovascular: Regular rate and rhythm, no murmurs / rubs / gallops. No extremity edema. 2+ pedal pulses. No carotid bruits.  Abdomen: Obese, no distention.   Bowel sounds positive. Soft, no tenderness, no masses palpated. No hepatosplenomegaly. Musculoskeletal: Left knee immobilization in place.  Severely impaired left knee ROM.  No clubbing / cyanosis.  No contractures. Normal muscle tone.  Skin: Chronic stasis dermatitis changes on both lower pretibial area. Neurologic: CN 2-12 grossly intact. Sensation intact, DTR normal. Strength 5/5 in all 4.  Psychiatric: Normal judgment and insight. Alert and oriented x 3. Normal mood.   Labs on Admission: I have personally reviewed following labs  and imaging studies  CBC: Recent Labs  Lab 06/08/21 1112 06/11/21 1700 06/11/21 1708  WBC 7.8 8.0  --   NEUTROABS 5.6 5.1  --   HGB 12.7* 9.9* 9.9*  HCT 41.6 32.2* 29.0*  MCV 94.5 92.8  --   PLT 235 250  --     Basic Metabolic Panel: Recent Labs  Lab 06/08/21 1112 06/11/21 1700 06/11/21 1708  NA 138 135 137  K 4.7 4.2 4.2  CL 102 101 104  CO2 24 23  --  GLUCOSE 146* 184* 176*  BUN 43* 49* 41*  CREATININE 1.40* 1.52* 1.50*  CALCIUM 8.4* 8.1*  --     GFR: Estimated Creatinine Clearance: 92.5 mL/min (A) (by C-G formula based on SCr of 1.5 mg/dL (H)).  Liver Function Tests: Recent Labs  Lab 06/11/21 1700  AST 29  ALT 18  ALKPHOS 68  BILITOT 0.5  PROT 6.0*  ALBUMIN 2.8*   Radiological Exams on Admission: DG Lumbar Spine Complete  Result Date: 06/11/2021 CLINICAL DATA:  Left lower back pain after fall several days ago. EXAM: LUMBAR SPINE - COMPLETE 4+ VIEW COMPARISON:  None. FINDINGS: There is no evidence of lumbar spine fracture. Alignment is normal. Moderate degenerative disc disease is noted at T12-L1. Minimal anterior osteophyte formation is noted at L1-2. IMPRESSION: Multilevel degenerative changes are noted. No acute abnormality is noted. Aortic Atherosclerosis (ICD10-I70.0). Electronically Signed   By: Marijo Conception M.D.   On: 06/11/2021 17:52   DG Pelvis 1-2 Views  Result Date: 06/11/2021 CLINICAL DATA:  Fall.  Pelvic pain.  Initial encounter. EXAM: PELVIS - 1-2 VIEW COMPARISON:  None. FINDINGS: Patient is rotated to the left. There is no evidence of pelvic fracture or diastasis. No pelvic bone lesions are seen. IMPRESSION: Negative. Electronically Signed   By: Marlaine Hind M.D.   On: 06/11/2021 19:59   DG Knee 1-2 Views Left  Result Date: 06/11/2021 CLINICAL DATA:  Fall.  Left knee pain.  Initial encounter. EXAM: LEFT KNEE - 2 VIEW COMPARISON:  None. FINDINGS: Lateral dislocation of the knee is seen. Several bone fragments are seen inferior to the  femoral condyles and medial to the proximal tibia, which are consistent with fracture fragments which likely originate from the proximal tibia. IMPRESSION: Lateral dislocation of knee, with several fracture fragments likely originating from the proximal tibia. Electronically Signed   By: Marlaine Hind M.D.   On: 06/11/2021 20:01   DG Tibia/Fibula Left  Result Date: 06/11/2021 CLINICAL DATA:  Trauma EXAM: LEFT TIBIA AND FIBULA - 2 VIEW COMPARISON:  None. FINDINGS: There is lateral dislocation of the tibia relative to the femur with a fracture fragment distal to the medial femoral condyle, better visualized on concomitant knee radiographs. There is no distal fracture of the tibia or fracture of the fibula. IMPRESSION: Lateral dislocation of the tibia with fracture fragment distal to the medial femoral condyle. No distal fracture of the tibia or fibula. Electronically Signed   By: Ulyses Jarred M.D.   On: 06/11/2021 19:50   CT ABDOMEN PELVIS W CONTRAST  Result Date: 06/11/2021 CLINICAL DATA:  Fall with abdominal swelling. EXAM: CT ABDOMEN AND PELVIS WITH CONTRAST TECHNIQUE: Multidetector CT imaging of the abdomen and pelvis was performed using the standard protocol following bolus administration of intravenous contrast. CONTRAST:  17mL OMNIPAQUE IOHEXOL 350 MG/ML SOLN COMPARISON:  12/07/2015 foot FINDINGS: Moderate degradation secondary to patient body habitus. Lower chest: Lingular scarring. Mild cardiomegaly with multivessel coronary artery calcification. Trace left pleural fluid. Hepatobiliary: Normal liver. Normal gallbladder, without biliary ductal dilatation. Pancreas: Fatty replacement throughout the pancreas. Spleen: Normal in size, without focal abnormality. Adrenals/Urinary Tract: Normal adrenal glands. Normal kidneys, without hydronephrosis. Grossly normal urinary bladder. Stomach/Bowel: Normal stomach, without wall thickening. Normal colon, appendix, and terminal ileum. Left sided abdominal wall  laxity containing nonobstructive small bowel. No pneumatosis or free intraperitoneal air. Vascular/Lymphatic: Aortic atherosclerosis. No abdominopelvic adenopathy. Reproductive: Normal prostate. Other: No significant free fluid. The far left abdominopelvic wall is not imaged. Portions of the left abdominopelvic wall  are also markedly artifact degraded. Subcutaneous increased density within the left paramidline abdominal wall on 67/2 is nonspecific. Musculoskeletal: Mild S shaped thoracolumbar spine curvature. IMPRESSION: 1. Moderately degraded exam secondary to patient body habitus. 2. Given this limitation, no acute or posttraumatic deformity identified. 3. Trace left pleural fluid. 4. Coronary artery atherosclerosis. Aortic Atherosclerosis (ICD10-I70.0). Electronically Signed   By: Abigail Miyamoto M.D.   On: 06/11/2021 19:34   DG FEMUR MIN 2 VIEWS LEFT  Result Date: 06/11/2021 CLINICAL DATA:  Fall.  Left femur pain.  Initial encounter. EXAM: LEFT FEMUR 2 VIEWS COMPARISON:  None. FINDINGS: There is no evidence of fracture or other focal bone lesions. Soft tissues are unremarkable. IMPRESSION: Negative. Electronically Signed   By: Marlaine Hind M.D.   On: 06/11/2021 19:59    EKG: Independently reviewed.  06/08/2021 Vent. rate 78 BPM PR interval 217 ms QRS duration 94 ms QT/QTcB 386/434 ms P-R-T axes 97 60 60 Sinus rhythm Atrial premature complex Borderline prolonged PR interval Anterior infarct, old Similar to prior tracing  Assessment/Plan Principal Problem:   Left knee dislocation, initial encounter Observation/telemetry. Keep knee immobilized. Bed rest only per orthopedic surgery. Analgesics as needed. Orthopedic surgery following. May require MRI imaging.  Active Problems:   Pure hypercholesterolemia/Aortic atherosclerosis (HCC) Continue pravastatin 40 mg p.o. daily.    Coronary atherosclerosis No complaints of chest pain. Continue statin. Has been on anticoagulation. Follow-up  with cardiology as an outpatient.    Essential hypertension, benign Continue furosemide 40 mg p.o. daily. Continue amlodipine 10 mg p.o. daily. Continue quinapril 40 mg p.o. daily. Monitor BP, HR, GFR and electrolytes.    OSA (obstructive sleep apnea) Continue CPAP at bedtime. Patient is using his own device.    Hx pulmonary embolism Supratherapeutic INR. Warfarin has been held.    Esophageal reflux Continue PPI.    Diabetes mellitus type 2, uncontrolled (HCC) Carbohydrate modified diet. Continue metformin 1000 mg p.o. twice daily. Continue Lantus 50 units at bedtime. CBG monitoring with RI SS.    Diabetic neuropathy (HCC) Analgesics as needed. Add gabapentin or pregabalin if needed.    CKD (chronic kidney disease) stage 3a Monitor GFR and electrolytes.    Normocytic anemia Recent drop likely related to hemarthrosis. Monitor hematocrit and hemoglobin. Transfuse as needed.    Class 3 obesity Continue lifestyle modifications. Follow-up with PCP as an outpatient.     DVT prophylaxis: On warfarin.  Supratherapeutic. Code Status:   Full code. Family Communication:   Disposition Plan:   Patient is from:  Home.  Anticipated DC to:  TBD.  Anticipated DC date:  06/12/2021 or 06/13/2021.  Anticipated DC barriers: Clinical status/consultant sign off.  Consults called:  Orthopedic surgery Frankey Shown, MD) Admission status:  Observation/telemetry.  Severity of Illness:  High severity after presenting with history of a fall on Monday with a second fall today sustaining a left knee dislocation requiring a conscious sedation for reduction.  The patient also had a supratherapeutic INR that was treated in the ED.  Reubin Milan MD [facility]  How to contact the [facility] Attending or Consulting provider [place] or covering provider during after hours [place], for this patient?   Check the care team in [facility] and look for a) attending/consulting [facility] provider listed and  b) the [facility] team listed Log into [email or web address] and use 's universal password to access. If you do not have the password, please contact the hospital operator. Locate the [facility] provider you are looking for under  [facility] and page to a number that you can be directly reached. If you still have difficulty reaching the provider, please page the [facility] (Director on Call) for the Hospitalists listed on amion for assistance.  06/11/2021, 10:23 PM   This document was prepared using Dragon voice recognition software and may contain some unintended transcription errors.

## 2021-06-11 NOTE — ED Notes (Signed)
Pt placed in bariatric bed

## 2021-06-11 NOTE — Progress Notes (Signed)
Orthopedic Tech Progress Note Patient Details:  Joseph [name] [birth year] [id number]  Ortho Devices Type of Ortho Device: Knee Immobilizer Ortho Device/Splint Location: left Ortho Device/Splint Interventions: Application   Post Interventions Patient Tolerated: Well Instructions Provided: Care of device  Joseph [name] 06/11/2021, 8:51 PM

## 2021-06-11 NOTE — ED Provider Notes (Signed)
Physical Exam  BP (!) 184/48   Pulse 81   Temp 97.8 F (36.6 C) (Oral)   Resp 17   Ht 6\' 1"  (1.854 m)   Wt (!) 208.7 kg   SpO2 94%   BMI 60.69 kg/m   Physical Exam  ED Course/Procedures     .Sedation  Date/Time: 06/11/2021 9:04 PM Performed by: Drenda Freeze, MD Authorized by: Drenda Freeze, MD   Consent:    Consent obtained:  Verbal   Consent given by:  Patient   Alternatives discussed:  Analgesia without sedation Universal protocol:    Immediately prior to procedure, a time out was called: yes   Indications:    Procedure performed:  Dislocation reduction   Procedure necessitating sedation performed by:  Physician performing sedation Pre-sedation assessment:    Time since last food or drink:  10 hours   ASA classification: class 3 - patient with severe systemic disease     Mouth opening:  2 finger widths   Mallampati score:  II - soft palate, uvula, fauces visible   Neck mobility: reduced     Pre-sedation assessments completed and reviewed: airway patency and mental status   Immediate pre-procedure details:    Reassessment: Patient reassessed immediately prior to procedure     Reviewed: vital signs   Procedure details (see MAR for exact dosages):    Preoxygenation:  Nasal cannula   Analgesia:  Hydromorphone   Intra-procedure monitoring:  Blood pressure monitoring   Intra-procedure events: none     Intra-procedure management:  Airway repositioning   Total Provider sedation time (minutes):  30 Post-procedure details:    Post-sedation assessments completed and reviewed: airway patency and cardiovascular function    MDM  Care assumed at 4 PM.  Patient apparently had a fall prior to arrival in the left thigh x-ray.  Afterwards, patient's knee appears very swollen and there is ecchymosis around the knee.  Patient's foot appears to be point at to the left but he has good DP pulse.  Patient's INR is 6.  Given that he fell on his back, will need to get a CT  abdomen pelvis to rule out RP bleed as well as CT knee  7 pm Patient's x-ray showed left knee dislocation.  I consulted Dr. Erlinda Hong from Ortho.  He states that he will come in for reduction and asked me to do sedation  9:37 PM I performed conscious sedation.  Patient is on CPAP at night so I used his home machine during the procedure.  Dr. Erlinda Hong performed the reduction and confirmed by x-ray.  Knee immobilizer was placed by [employer].  Patient will need to be admitted given high risk of compartment syndrome.  Patient also had his INR reversed with FFP and vitamin K.  Patient has hemarthrosis on CT.  Hospitalist to admit and Ortho to follow-up in the morning.  CRITICAL CARE Performed by: Wandra Arthurs   Total critical care time: 30 minutes  Critical care time was exclusive of separately billable procedures and treating other patients.  Critical care was necessary to treat or prevent imminent or life-threatening deterioration.  Critical care was time spent personally by me on the following activities: development of treatment plan with patient and/or surrogate as well as nursing, discussions with consultants, evaluation of patient's response to treatment, examination of patient, obtaining history from patient or surrogate, ordering and performing treatments and interventions, ordering and review of laboratory studies, ordering and review of radiographic studies, pulse oximetry and re-evaluation  of patient's condition.      Drenda Freeze, MD 06/11/21 2139

## 2021-06-11 NOTE — ED Notes (Signed)
This RN alerted by xray tech that pt had mechanical fall after imaging obtained. On this RN arrival to xray, pt noted to be laying supine on floor . Pt A&O x 4 , c/o left lower leg pain. Per xray staff , pt did not hit head. Multiple staff members and specialized lift  to xray room to assist with pt getting off floor back to bed. Pt lifted back to bed without difficulty, tolerated well. EDP Zenia Resides made aware of pt falling, Pt wife is at bedside and aware of incident. Will continue to monitor.

## 2021-06-11 NOTE — ED Provider Notes (Signed)
[facility] DEPT Provider Note   CSN: [id number] Arrival date & time: 06/11/21  1309     History Chief Complaint  Patient presents with   Leg Swelling   Leg Pain    left    Joseph Ghan. is a 65 y.o. male.  65 year old male presents with increased swelling to left lower extremity after recent fall several days ago.  Had negative x-rays.  He does take Coumadin for history of prior DVT.  States he felt a lump in his calf was concerned about possible blood clot.  Denies any new shortness of breath or pleuritic chest pain.  No distal numbness or tingling to his left foot.  Has been using Robaxin at home without relief      Past Medical History:  Diagnosis Date   Anxiety state, unspecified    Depression    DVT (deep venous thrombosis) ([place]) 2004   Left knee   Edema    Esophageal reflux    occ   Essential hypertension, benign    Essential hypertension, benign    Falls frequently    History of iron deficiency anemia    Morbid obesity (HCC)    Myalgia and myositis, unspecified    BACK AND LEGS   OSA on CPAP    Peripheral vascular disease, unspecified ([place])    Personal history of PE (pulmonary embolism) 2004   Polyneuropathy in diabetes(357.2)    Poor venous access    PT STATES DIFFICULT TO DRAW BLOOD FROM HIS VEINS   Pure hypercholesterolemia    Shortness of breath    DUE TO WEIGHT AND LOSS OF MOBILITY   Type II or unspecified type diabetes mellitus with neurological manifestations, not stated as uncontrolled(250.60)    Type II or unspecified type diabetes mellitus without mention of complication, not stated as uncontrolled    Type II or unspecified type diabetes mellitus without mention of complication, uncontrolled    Varicose veins of lower extremities with inflammation    Wears glasses    Wound infection after surgery    required hospitalization    Patient Active Problem List   Diagnosis Date Noted   Osteomyelitis of great toe of  right foot ([place]) 04/03/2020   Osteomyelitis of great toe of left foot ([place]) 04/03/2020   CKD (chronic kidney disease) stage 3, GFR 30-59 ml/min ([place]) 04/03/2020   Monitoring for anticoagulant use 02/19/2020   Accumulation of fluid in tissues 02/19/2020   Anxiety state 02/19/2020   Diabetes mellitus type 2, uncontrolled ([place]) 02/19/2020   Diabetes mellitus with polyneuropathy ([place]) 02/19/2020   Fibrositis 02/19/2020   Peripheral blood vessel disorder ([place]) 02/19/2020   Varicose veins of lower extremities with inflammation 02/19/2020   Acute renal insufficiency 01/03/2015   Esophageal reflux 01/02/2015   Polyneuropathy in diabetes(357.2) 01/02/2015   Abscess of postoperative wound of abdominal wall 01/02/2015   Cellulitis and abscess of trunk 12/31/2014   S/P IVC filter-temporary REMOVED    Status post panniculectomy Oct 2015 07/17/2014   Morbid obesity BMI 65 03/22/2014   Pure hypercholesterolemia 03/22/2014   Essential hypertension, benign 03/22/2014   Diabetes ([place]) 03/22/2014   Panniculitis 03/22/2014   OSA (obstructive sleep apnea) 03/22/2014   Hx pulmonary embolism 03/22/2014   Diabetic neuropathy ([place]) 02/20/2014    Past Surgical History:  Procedure Laterality Date   AMPUTATION TOE Bilateral 04/03/2020   Procedure: BILATERAL HALLUX AMPUTATION;  Surgeon: Felipa Furnace, DPM;  Location: [facility];  Service: Podiatry;  Laterality: Bilateral;  COLONOSCOPY WITH PROPOFOL N/A 07/20/2019   Procedure: COLONOSCOPY WITH PROPOFOL;  Surgeon: Wonda Horner, MD;  Location: [facility];  Service: Endoscopy;  Laterality: N/A;   I & D EXTREMITY Bilateral 04/05/2020   Procedure: IRRIGATION AND DEBRIDEMENT EXTREMITY WITH CLOSURE - BILATERALLY;  Surgeon: Felipa Furnace, DPM;  Location: [facility];  Service: Podiatry;  Laterality: Bilateral;   NO PAST SURGERIES     PANNICULECTOMY N/A 07/15/2014   Procedure: PANNICULECTOMY;  Surgeon: Pedro Earls, MD;  Location: [facility];  Service: General;   Laterality: N/A;   POLYPECTOMY  07/20/2019   Procedure: POLYPECTOMY;  Surgeon: Wonda Horner, MD;  Location: [facility];  Service: Endoscopy;;       No family history on file.  Social History   Tobacco Use   Smoking status: Never   Smokeless tobacco: Never  Vaping Use   Vaping Use: Never used  Substance Use Topics   Alcohol use: Not Currently   Drug use: No    Home Medications Prior to Admission medications   Medication Sig Start Date End Date Taking? Authorizing Provider  acetaminophen (TYLENOL) 325 MG tablet Take 2 tablets (650 mg total) by mouth every 6 (six) hours as needed. 06/08/21   Jeanell Sparrow, DO  acetaminophen (TYLENOL) 500 MG tablet Take 1,000 mg by mouth daily as needed for moderate pain or headache.     [provider]  amLODipine (NORVASC) 10 MG tablet Take 10 mg by mouth every evening.     [provider]  buPROPion (WELLBUTRIN XL) 150 MG 24 hr tablet Take 150 mg by mouth daily. Patient not taking: No sig reported 10/20/15   [provider]  cephALEXin (KEFLEX) 250 MG capsule Take 250 mg by mouth at bedtime. 06/05/21   [provider]  Continuous Blood Gluc Sensor (FREESTYLE LIBRE 2 SENSOR) MISC APPLY EVERY 14 DAYS AS DIRECTED 03/28/20   [provider]  FLUoxetine (PROZAC) 40 MG capsule Take 40 mg by mouth every evening.     [provider]  FREESTYLE LITE test strip TEST BLOOD SUGAR FOUR TIMES DAILY 02/11/20   [provider]  furosemide (LASIX) 40 MG tablet Take 40 mg by mouth daily.     [provider]  indapamide (LOZOL) 2.5 MG tablet Take 2.5 mg by mouth 2 (two) times daily.     [provider]  insulin aspart (NOVOLOG) 100 UNIT/ML injection Inject 0-25 Units into the skin with breakfast, with lunch, and with evening meal. Sliding scale per meals 02/05/20   [provider]  LANTUS 100 UNIT/ML injection Inject 50 Units into the skin at bedtime. 06/08/21   [provider]  metFORMIN (GLUCOPHAGE) 1000 MG tablet Take 1,000 mg by mouth 2 (two) times daily with a meal.    [provider]  methocarbamol (ROBAXIN) 500 MG tablet Take 2 tablets (1,000 mg total) by mouth 2 (two) times daily for 7 days. 06/08/21 06/15/21  Jeanell Sparrow, DO  omeprazole (PRILOSEC) 40 MG capsule Take 40 mg by mouth daily as needed (acid reflux).     [provider]  pioglitazone (ACTOS) 45 MG tablet Take 45 mg by mouth daily.    [provider]  pravastatin (PRAVACHOL) 40 MG tablet Take 40 mg by mouth every evening.     [provider]  Prenatal Vit-Fe Fumarate-FA (PRENATAL FA PO) Take 1 tablet by mouth daily.    [provider]  quinapril (ACCUPRIL) 40 MG tablet Take 40 mg  by mouth daily.     [provider]  silver sulfADIAZINE (SILVADENE) 1 % cream Apply 1 application topically daily. Patient taking differently: Apply 1 application topically daily as needed (wounds on toes and legs). 02/26/20   Rosetta Posner, MD  tamsulosin (FLOMAX) 0.4 MG CAPS capsule Take 0.4 mg by mouth daily. 04/02/21   [provider]  warfarin (COUMADIN) 5 MG tablet Take 5-7.5 mg by mouth See admin instructions. Take 5 mg in the evening on Sun, Mon, Wed, and Fri. Take 7.5 mg in the evening on Tue, Thur, and Sat    [provider]  Wheat Dextrin (BENEFIBER) POWD Take 15 mLs by mouth daily as needed (constipation). Mix in coffee and drink    [provider]    Allergies    Tape and Chlorhexidine  Review of Systems   Review of Systems  All other systems reviewed and are negative.  Physical Exam Updated Vital Signs BP (!) 178/54   Pulse 75   Temp 97.8 F (36.6 C) (Oral)   Resp 18   Ht 1.854 m (6\' 1" )   Wt (!) 208.7 kg   SpO2 100%   BMI 60.69 kg/m   Physical Exam Vitals and nursing note reviewed.  Constitutional:      General: He is not in acute distress.    Appearance: Normal appearance. He is well-developed. He  is not toxic-appearing.  HENT:     Head: Normocephalic and atraumatic.  Eyes:     General: Lids are normal.     Conjunctiva/sclera: Conjunctivae normal.     Pupils: Pupils are equal, round, and reactive to light.  Neck:     Thyroid: No thyroid mass.     Trachea: No tracheal deviation.  Cardiovascular:     Rate and Rhythm: Normal rate and regular rhythm.     Heart sounds: Normal heart sounds. No murmur heard.   No gallop.  Pulmonary:     Effort: Pulmonary effort is normal. No respiratory distress.     Breath sounds: Normal breath sounds. No stridor. No decreased breath sounds, wheezing, rhonchi or rales.  Abdominal:     General: There is no distension.     Palpations: Abdomen is soft.     Tenderness: There is no abdominal tenderness. There is no rebound.  Musculoskeletal:        General: No tenderness. Normal range of motion.     Cervical back: Normal range of motion and neck supple.       Legs:  Skin:    General: Skin is warm and dry.     Findings: No abrasion or rash.  Neurological:     Mental Status: He is alert and oriented to person, place, and time. Mental status is at baseline.     GCS: GCS eye subscore is 4. GCS verbal subscore is 5. GCS motor subscore is 6.     Cranial Nerves: Cranial nerves are intact. No cranial nerve deficit.     Sensory: No sensory deficit.     Motor: Motor function is intact.  Psychiatric:        Attention and Perception: Attention normal.        Speech: Speech normal.        Behavior: Behavior normal.    ED Results / Procedures / Treatments   Labs (all labs ordered are listed, but only abnormal results are displayed) Labs Reviewed  PROTIME-INR    EKG None  Radiology No results found.  Procedures Procedures  Medications Ordered in ED Medications  oxyCODONE-acetaminophen (PERCOCET/ROXICET) 5-325 MG per tablet 2 tablet (has no administration in time range)    ED Course  I have reviewed the triage vital signs and the nursing  notes.  Pertinent labs & imaging results that were available during my care of the patient were reviewed by me and considered in my medical decision making (see chart for details).    MDM Rules/Calculators/A&P                          Patient's INR is over 6 here Patient was sent to x-ray to have LS-spine series performed.  When patient stood up he apparently fell down onto his left leg.  There was deformity noted at the left knee.  He did not strike his head he did not pass out.  Patient is to have a portable x-rays of his knee and pelvis.  Care signed out to Dr. Darl Householder Final Clinical Impression(s) / ED Diagnoses Final diagnoses:  None    Rx / DC Orders ED Discharge Orders     None        Lacretia Leigh, MD 06/13/21 1441

## 2021-06-11 NOTE — Sedation Documentation (Signed)
X-ray at bedside

## 2021-06-11 NOTE — ED Notes (Signed)
Pt transported to xray 

## 2021-06-11 NOTE — ED Notes (Signed)
Ortho contacted and responded stated they will be here in 10-15 minutes

## 2021-06-11 NOTE — ED Triage Notes (Addendum)
Pt to ED via EMS from home c/o LLE swelling. Pt concerned for blood clot. Reports increase pain to lle that started today, not warm to touch. Pt fell on Monday and was evaluated at [facility] and discharged, Pt takes Coumadin. No medications given by EMS. Last VS: 144/58, hr 88, RR18, 99%ra. Cbg 107.

## 2021-06-11 NOTE — Telephone Encounter (Signed)
ED RNCM was updated by [facility] that they were not able to accept referral due to staffing, Patient's spouse was updated by [facility] ED RNCM. 9/14.

## 2021-06-11 NOTE — Progress Notes (Signed)
At bedside for conscious sedation. Per EDP, patient remained on home CPAP unit with O2 bleed in to assist in maintaining airway patency. Preoxygenated with 6L bleed and increased to max of 10L when SpO2 dropped below 86. Patient tolerated the procedure with no need for additional airway manipulation. Once he awakened, O2 quickly dropped to 3L. RN made aware prior to RT exiting the room.

## 2021-06-11 NOTE — Consult Note (Signed)
ORTHOPAEDIC CONSULTATION  REQUESTING PHYSICIAN: Dr. Darl Householder  Chief Complaint: Left knee injury  HPI: Joseph [name]. is a 65 y.o. male who is super morbidly obese with BMI of greater than 60 sustained a left knee injury earlier today.  The patient takes chronic Coumadin for history of DVT.  He originally had a fall this past Monday and was brought to the ER for evaluation and x-rays were reportedly negative although upon personal review it may have indicated an MCL rupture from the femoral attachment.  He continues to have left knee pain and problems throughout the week and fell again earlier today at home and was brought to the ER by EMS.  While getting off of the x-ray table his knee gave out and he fell down onto the ground and had immediate pain and deformity.  He started developing swelling in his thigh and knee as well due to Coumadin.  He denies any numbness and tingling in left foot.  He endorses severe pain in the left knee.  X-rays then showed a knee dislocation which was then confirmed on CT scan as a posterior lateral dislocation.  Orthopedics was asked to consult.  Past Medical History:  Diagnosis Date   Anxiety state, unspecified    Depression    DVT (deep venous thrombosis) ([place]) 2004   Left knee   Edema    Esophageal reflux    occ   Essential hypertension, benign    Essential hypertension, benign    Falls frequently    History of iron deficiency anemia    Morbid obesity (HCC)    Myalgia and myositis, unspecified    BACK AND LEGS   OSA on CPAP    Peripheral vascular disease, unspecified ([place])    Personal history of PE (pulmonary embolism) 2004   Polyneuropathy in diabetes(357.2)    Poor venous access    PT STATES DIFFICULT TO DRAW BLOOD FROM HIS VEINS   Pure hypercholesterolemia    Shortness of breath    DUE TO WEIGHT AND LOSS OF MOBILITY   Type II or unspecified type diabetes mellitus with neurological manifestations, not stated as uncontrolled(250.60)    Type II  or unspecified type diabetes mellitus without mention of complication, not stated as uncontrolled    Type II or unspecified type diabetes mellitus without mention of complication, uncontrolled    Varicose veins of lower extremities with inflammation    Wears glasses    Wound infection after surgery    required hospitalization   Past Surgical History:  Procedure Laterality Date   AMPUTATION TOE Bilateral 04/03/2020   Procedure: BILATERAL HALLUX AMPUTATION;  Surgeon: Felipa Furnace, DPM;  Location: [facility];  Service: Podiatry;  Laterality: Bilateral;   COLONOSCOPY WITH PROPOFOL N/A 07/20/2019   Procedure: COLONOSCOPY WITH PROPOFOL;  Surgeon: Wonda Horner, MD;  Location: [facility];  Service: Endoscopy;  Laterality: N/A;   I & D EXTREMITY Bilateral 04/05/2020   Procedure: IRRIGATION AND DEBRIDEMENT EXTREMITY WITH CLOSURE - BILATERALLY;  Surgeon: Felipa Furnace, DPM;  Location: [facility];  Service: Podiatry;  Laterality: Bilateral;   NO PAST SURGERIES     PANNICULECTOMY N/A 07/15/2014   Procedure: PANNICULECTOMY;  Surgeon: Pedro Earls, MD;  Location: [facility];  Service: General;  Laterality: N/A;   POLYPECTOMY  07/20/2019   Procedure: POLYPECTOMY;  Surgeon: Wonda Horner, MD;  Location: [facility];  Service: Endoscopy;;   Social History   Socioeconomic History   Marital status: Married  Spouse name: Not on file   Number of children: Not on file   Years of education: Not on file   Highest education level: Not on file  Occupational History   Not on file  Tobacco Use   Smoking status: Never   Smokeless tobacco: Never  Vaping Use   Vaping Use: Never used  Substance and Sexual Activity   Alcohol use: Not Currently   Drug use: No   Sexual activity: Not on file  Other Topics Concern   Not on file  Social History Narrative   Not on file   Social Determinants of Health   Financial Resource Strain: Not on file  Food Insecurity: Not on file  Transportation Needs: Not on file   Physical Activity: Not on file  Stress: Not on file  Social Connections: Not on file   No family history on file. - negative except otherwise stated in the family history section Allergies  Allergen Reactions   Tape     Peels skin on legs   Chlorhexidine Rash    Wipes   Prior to Admission medications   Medication Sig Start Date End Date Taking? Authorizing Provider  acetaminophen (TYLENOL) 500 MG tablet Take 1,000 mg by mouth daily as needed for moderate pain or headache.    Yes [provider]  amLODipine (NORVASC) 10 MG tablet Take 10 mg by mouth every evening.    Yes [provider]  cephALEXin (KEFLEX) 250 MG capsule Take 250 mg by mouth at bedtime. 06/05/21  Yes [provider]  FLUoxetine (PROZAC) 40 MG capsule Take 40 mg by mouth every evening.    Yes [provider]  furosemide (LASIX) 40 MG tablet Take 40 mg by mouth daily.    Yes [provider]  indapamide (LOZOL) 2.5 MG tablet Take 5 mg by mouth daily.   Yes [provider]  insulin aspart (NOVOLOG) 100 UNIT/ML injection Inject 0-25 Units into the skin as needed for high blood sugar. Sliding Scale 06/08/21  Yes [provider]  LANTUS 100 UNIT/ML injection Inject 50 Units into the skin at bedtime. 06/08/21  Yes [provider]  metFORMIN (GLUCOPHAGE) 1000 MG tablet Take 1,000 mg by mouth 2 (two) times daily with a meal.   Yes [provider]  methocarbamol (ROBAXIN) 500 MG tablet Take 2 tablets (1,000 mg total) by mouth 2 (two) times daily for 7 days. 06/08/21 06/15/21 Yes Jeanell Sparrow, DO  omeprazole (PRILOSEC) 40 MG capsule Take 40 mg by mouth daily as needed (acid reflux).    Yes [provider]  pioglitazone (ACTOS) 45 MG tablet Take 45 mg by mouth daily.   Yes [provider]  pravastatin (PRAVACHOL) 40 MG tablet Take 40 mg by mouth every evening.    Yes [provider]  Prenatal Vit-Fe Fumarate-FA (PRENATAL FA PO)  Take 1 tablet by mouth daily.   Yes [provider]  quinapril (ACCUPRIL) 40 MG tablet Take 40 mg by mouth daily.    Yes [provider]  silver sulfADIAZINE (SILVADENE) 1 % cream Apply 1 application topically daily. Patient taking differently: Apply 1 application topically daily as needed (wounds on toes and legs). 02/26/20  Yes Early, Arvilla Meres, MD  tamsulosin (FLOMAX) 0.4 MG CAPS capsule Take 0.4 mg by mouth daily. 04/02/21  Yes [provider]  warfarin (COUMADIN) 5 MG tablet Take 5-7.5 mg by mouth See admin instructions. Take 5 mg in the evening on Sun, Mon, Wed, and Fri.  Take 7.5 mg in the evening on Tue, Thur, and Sat   Yes [provider]  Wheat Dextrin (BENEFIBER) POWD Take 15 mLs by mouth daily as needed (constipation). Mix in coffee and drink   Yes [provider]  acetaminophen (TYLENOL) 325 MG tablet Take 2 tablets (650 mg total) by mouth every 6 (six) hours as needed. Patient not taking: Reported on 06/11/2021 06/08/21   Wynona Dove A, DO  Continuous Blood Gluc Sensor (FREESTYLE LIBRE 2 SENSOR) MISC APPLY EVERY 14 DAYS AS DIRECTED 03/28/20   [provider]  FREESTYLE LITE test strip TEST BLOOD SUGAR FOUR TIMES DAILY 02/11/20   [provider]   DG Lumbar Spine Complete  Result Date: 06/11/2021 CLINICAL DATA:  Left lower back pain after fall several days ago. EXAM: LUMBAR SPINE - COMPLETE 4+ VIEW COMPARISON:  None. FINDINGS: There is no evidence of lumbar spine fracture. Alignment is normal. Moderate degenerative disc disease is noted at T12-L1. Minimal anterior osteophyte formation is noted at L1-2. IMPRESSION: Multilevel degenerative changes are noted. No acute abnormality is noted. Aortic Atherosclerosis (ICD10-I70.0). Electronically Signed   By: Marijo Conception M.D.   On: 06/11/2021 17:52   DG Pelvis 1-2 Views  Result Date: 06/11/2021 CLINICAL DATA:  Fall.  Pelvic pain.  Initial encounter. EXAM: PELVIS - 1-2 VIEW COMPARISON:   None. FINDINGS: Patient is rotated to the left. There is no evidence of pelvic fracture or diastasis. No pelvic bone lesions are seen. IMPRESSION: Negative. Electronically Signed   By: Marlaine Hind M.D.   On: 06/11/2021 19:59   DG Knee 1-2 Views Left  Result Date: 06/11/2021 CLINICAL DATA:  Fall.  Left knee pain.  Initial encounter. EXAM: LEFT KNEE - 2 VIEW COMPARISON:  None. FINDINGS: Lateral dislocation of the knee is seen. Several bone fragments are seen inferior to the femoral condyles and medial to the proximal tibia, which are consistent with fracture fragments which likely originate from the proximal tibia. IMPRESSION: Lateral dislocation of knee, with several fracture fragments likely originating from the proximal tibia. Electronically Signed   By: Marlaine Hind M.D.   On: 06/11/2021 20:01   DG Tibia/Fibula Left  Result Date: 06/11/2021 CLINICAL DATA:  Trauma EXAM: LEFT TIBIA AND FIBULA - 2 VIEW COMPARISON:  None. FINDINGS: There is lateral dislocation of the tibia relative to the femur with a fracture fragment distal to the medial femoral condyle, better visualized on concomitant knee radiographs. There is no distal fracture of the tibia or fracture of the fibula. IMPRESSION: Lateral dislocation of the tibia with fracture fragment distal to the medial femoral condyle. No distal fracture of the tibia or fibula. Electronically Signed   By: Ulyses Jarred M.D.   On: 06/11/2021 19:50   CT ABDOMEN PELVIS W CONTRAST  Result Date: 06/11/2021 CLINICAL DATA:  Fall with abdominal swelling. EXAM: CT ABDOMEN AND PELVIS WITH CONTRAST TECHNIQUE: Multidetector CT imaging of the abdomen and pelvis was performed using the standard protocol following bolus administration of intravenous contrast. CONTRAST:  144mL OMNIPAQUE IOHEXOL 350 MG/ML SOLN COMPARISON:  12/07/2015 foot FINDINGS: Moderate degradation secondary to patient body habitus. Lower chest: Lingular scarring. Mild cardiomegaly with multivessel coronary  artery calcification. Trace left pleural fluid. Hepatobiliary: Normal liver. Normal gallbladder, without biliary ductal dilatation. Pancreas: Fatty replacement throughout the pancreas. Spleen: Normal in size, without focal abnormality. Adrenals/Urinary Tract: Normal adrenal glands. Normal kidneys, without hydronephrosis. Grossly normal urinary bladder. Stomach/Bowel: Normal stomach, without wall thickening. Normal colon, appendix, and terminal ileum. Left sided  abdominal wall laxity containing nonobstructive small bowel. No pneumatosis or free intraperitoneal air. Vascular/Lymphatic: Aortic atherosclerosis. No abdominopelvic adenopathy. Reproductive: Normal prostate. Other: No significant free fluid. The far left abdominopelvic wall is not imaged. Portions of the left abdominopelvic wall are also markedly artifact degraded. Subcutaneous increased density within the left paramidline abdominal wall on 67/2 is nonspecific. Musculoskeletal: Mild S shaped thoracolumbar spine curvature. IMPRESSION: 1. Moderately degraded exam secondary to patient body habitus. 2. Given this limitation, no acute or posttraumatic deformity identified. 3. Trace left pleural fluid. 4. Coronary artery atherosclerosis. Aortic Atherosclerosis (ICD10-I70.0). Electronically Signed   By: Abigail Miyamoto M.D.   On: 06/11/2021 19:34   DG FEMUR MIN 2 VIEWS LEFT  Result Date: 06/11/2021 CLINICAL DATA:  Fall.  Left femur pain.  Initial encounter. EXAM: LEFT FEMUR 2 VIEWS COMPARISON:  None. FINDINGS: There is no evidence of fracture or other focal bone lesions. Soft tissues are unremarkable. IMPRESSION: Negative. Electronically Signed   By: Marlaine Hind M.D.   On: 06/11/2021 19:59   - pertinent xrays, CT, MRI studies were reviewed and independently interpreted  Positive ROS: All other systems have been reviewed and were otherwise negative with the exception of those mentioned in the HPI and as above.  Physical Exam: General: No acute  distress Cardiovascular: No pedal edema Respiratory: No cyanosis, no use of accessory musculature GI: Large abdominal pannus Skin: Moderate ecchymosis around the knee. Neurologic: Sensation intact distally Psychiatric: Patient is at baseline mood and affect Lymphatic: No axillary or cervical lymphadenopathy  MUSCULOSKELETAL:  Examination of the left lower extremity shows moderate ecchymosis throughout the knee.  Compartments are soft.  He is very tender on the medial side of the knee.  Bony landmarks are limited secondary to body habitus and swelling.  He has strong dorsalis pedis pulse.  He has no motor or sensory deficits.  His foot is warm and well-perfused.  Capillary refill is normal.  Severe venous stasis dermatitis with open superficial wounds  Assessment: Left knee dislocation  Plan: I was able to reduce the knee under conscious sedation which was confirmed under portable x-rays.  He has slight residual lateral translation of the tibia but given the patient's body habitus and his stability I did not feel that I could achieve an anatomic reduction.  Due to the open venous stasis wounds on his lower leg I cannot safely apply an external fixator without significant risk of severe infection that would be limb or even possibly life-threatening.  Given the medical complexity of this patient I doubt that the patient is a surgical candidate but I will consult my colleague on this matter.  In the meantime we will follow along with serial x-rays of the knee.  He is to remain in the knee immobilizer at all times.  He is not allowed to participate with physical therapy.  He should remain on bedrest.  Fortunately for the patient it appears that he does not have a vascular or neurologic injury from the dislocation as his neurovascular exam is normal.  We may consider obtaining an MRI of the knee at a later time.  Thank you for the consult and the opportunity to see Mr. Solon Palm. Eduard Roux,  MD [facility] 9:31 PM

## 2021-06-12 ENCOUNTER — Encounter (HOSPITAL_COMMUNITY): Payer: Self-pay | Admitting: Internal Medicine

## 2021-06-12 ENCOUNTER — Other Ambulatory Visit: Payer: Self-pay

## 2021-06-12 DIAGNOSIS — I493 Ventricular premature depolarization: Secondary | ICD-10-CM | POA: Diagnosis not present

## 2021-06-12 DIAGNOSIS — E1122 Type 2 diabetes mellitus with diabetic chronic kidney disease: Secondary | ICD-10-CM | POA: Diagnosis present

## 2021-06-12 DIAGNOSIS — E1165 Type 2 diabetes mellitus with hyperglycemia: Secondary | ICD-10-CM | POA: Diagnosis present

## 2021-06-12 DIAGNOSIS — Z7189 Other specified counseling: Secondary | ICD-10-CM | POA: Diagnosis not present

## 2021-06-12 DIAGNOSIS — D631 Anemia in chronic kidney disease: Secondary | ICD-10-CM | POA: Diagnosis present

## 2021-06-12 DIAGNOSIS — D62 Acute posthemorrhagic anemia: Secondary | ICD-10-CM | POA: Diagnosis not present

## 2021-06-12 DIAGNOSIS — W06XXXA Fall from bed, initial encounter: Secondary | ICD-10-CM | POA: Diagnosis present

## 2021-06-12 DIAGNOSIS — Z6841 Body Mass Index (BMI) 40.0 and over, adult: Secondary | ICD-10-CM | POA: Diagnosis not present

## 2021-06-12 DIAGNOSIS — S81002D Unspecified open wound, left knee, subsequent encounter: Secondary | ICD-10-CM | POA: Diagnosis not present

## 2021-06-12 DIAGNOSIS — N1832 Chronic kidney disease, stage 3b: Secondary | ICD-10-CM | POA: Diagnosis present

## 2021-06-12 DIAGNOSIS — F331 Major depressive disorder, recurrent, moderate: Secondary | ICD-10-CM | POA: Diagnosis not present

## 2021-06-12 DIAGNOSIS — E669 Obesity, unspecified: Secondary | ICD-10-CM | POA: Diagnosis not present

## 2021-06-12 DIAGNOSIS — I700 Atherosclerosis of aorta: Secondary | ICD-10-CM | POA: Diagnosis present

## 2021-06-12 DIAGNOSIS — M25462 Effusion, left knee: Secondary | ICD-10-CM

## 2021-06-12 DIAGNOSIS — I10 Essential (primary) hypertension: Secondary | ICD-10-CM | POA: Diagnosis not present

## 2021-06-12 DIAGNOSIS — Z992 Dependence on renal dialysis: Secondary | ICD-10-CM | POA: Diagnosis not present

## 2021-06-12 DIAGNOSIS — N179 Acute kidney failure, unspecified: Secondary | ICD-10-CM | POA: Diagnosis present

## 2021-06-12 DIAGNOSIS — Z20822 Contact with and (suspected) exposure to covid-19: Secondary | ICD-10-CM | POA: Diagnosis present

## 2021-06-12 DIAGNOSIS — E1142 Type 2 diabetes mellitus with diabetic polyneuropathy: Secondary | ICD-10-CM | POA: Diagnosis present

## 2021-06-12 DIAGNOSIS — S83105A Unspecified dislocation of left knee, initial encounter: Secondary | ICD-10-CM | POA: Diagnosis present

## 2021-06-12 DIAGNOSIS — I440 Atrioventricular block, first degree: Secondary | ICD-10-CM | POA: Diagnosis not present

## 2021-06-12 DIAGNOSIS — J9621 Acute and chronic respiratory failure with hypoxia: Secondary | ICD-10-CM | POA: Diagnosis not present

## 2021-06-12 DIAGNOSIS — I130 Hypertensive heart and chronic kidney disease with heart failure and stage 1 through stage 4 chronic kidney disease, or unspecified chronic kidney disease: Secondary | ICD-10-CM | POA: Diagnosis present

## 2021-06-12 DIAGNOSIS — I2510 Atherosclerotic heart disease of native coronary artery without angina pectoris: Secondary | ICD-10-CM | POA: Diagnosis present

## 2021-06-12 DIAGNOSIS — L97824 Non-pressure chronic ulcer of other part of left lower leg with necrosis of bone: Secondary | ICD-10-CM | POA: Diagnosis not present

## 2021-06-12 DIAGNOSIS — Z9189 Other specified personal risk factors, not elsewhere classified: Secondary | ICD-10-CM | POA: Diagnosis not present

## 2021-06-12 DIAGNOSIS — R001 Bradycardia, unspecified: Secondary | ICD-10-CM | POA: Diagnosis not present

## 2021-06-12 DIAGNOSIS — Z86711 Personal history of pulmonary embolism: Secondary | ICD-10-CM | POA: Diagnosis not present

## 2021-06-12 DIAGNOSIS — E8729 Other acidosis: Secondary | ICD-10-CM | POA: Diagnosis not present

## 2021-06-12 DIAGNOSIS — E1151 Type 2 diabetes mellitus with diabetic peripheral angiopathy without gangrene: Secondary | ICD-10-CM | POA: Diagnosis present

## 2021-06-12 DIAGNOSIS — E11649 Type 2 diabetes mellitus with hypoglycemia without coma: Secondary | ICD-10-CM | POA: Diagnosis present

## 2021-06-12 DIAGNOSIS — I5032 Chronic diastolic (congestive) heart failure: Secondary | ICD-10-CM | POA: Diagnosis present

## 2021-06-12 DIAGNOSIS — E1143 Type 2 diabetes mellitus with diabetic autonomic (poly)neuropathy: Secondary | ICD-10-CM | POA: Diagnosis not present

## 2021-06-12 DIAGNOSIS — L89150 Pressure ulcer of sacral region, unstageable: Secondary | ICD-10-CM | POA: Diagnosis present

## 2021-06-12 DIAGNOSIS — J9622 Acute and chronic respiratory failure with hypercapnia: Secondary | ICD-10-CM | POA: Diagnosis not present

## 2021-06-12 DIAGNOSIS — G4733 Obstructive sleep apnea (adult) (pediatric): Secondary | ICD-10-CM | POA: Diagnosis not present

## 2021-06-12 DIAGNOSIS — G9341 Metabolic encephalopathy: Secondary | ICD-10-CM | POA: Diagnosis not present

## 2021-06-12 DIAGNOSIS — T1490XA Injury, unspecified, initial encounter: Secondary | ICD-10-CM | POA: Diagnosis present

## 2021-06-12 DIAGNOSIS — Y92009 Unspecified place in unspecified non-institutional (private) residence as the place of occurrence of the external cause: Secondary | ICD-10-CM | POA: Diagnosis not present

## 2021-06-12 DIAGNOSIS — Z515 Encounter for palliative care: Secondary | ICD-10-CM | POA: Diagnosis not present

## 2021-06-12 DIAGNOSIS — Z66 Do not resuscitate: Secondary | ICD-10-CM | POA: Diagnosis present

## 2021-06-12 HISTORY — DX: Atherosclerosis of aorta: I70.0

## 2021-06-12 LAB — BASIC METABOLIC PANEL
Anion gap: 6 (ref 5–15)
BUN: 48 mg/dL — ABNORMAL HIGH (ref 8–23)
CO2: 28 mmol/L (ref 22–32)
Calcium: 7.9 mg/dL — ABNORMAL LOW (ref 8.9–10.3)
Chloride: 104 mmol/L (ref 98–111)
Creatinine, Ser: 1.43 mg/dL — ABNORMAL HIGH (ref 0.61–1.24)
GFR, Estimated: 55 mL/min — ABNORMAL LOW (ref >60)
Glucose, Bld: 142 mg/dL — ABNORMAL HIGH (ref 70–99)
Potassium: 4 mmol/L (ref 3.5–5.1)
Sodium: 138 mmol/L (ref 135–145)

## 2021-06-12 LAB — BPAM FFP
Blood Product Expiration Date: 202209202359
ISSUE DATE / TIME: 202209151947
Unit Type and Rh: 6200

## 2021-06-12 LAB — CBC
HCT: 26.3 % — ABNORMAL LOW (ref 39.0–52.0)
Hemoglobin: 8.1 g/dL — ABNORMAL LOW (ref 13.0–17.0)
MCH: 28.5 pg (ref 26.0–34.0)
MCHC: 30.8 g/dL (ref 30.0–36.0)
MCV: 92.6 fL (ref 80.0–100.0)
Platelets: 229 10*3/uL (ref 150–400)
RBC: 2.84 MIL/uL — ABNORMAL LOW (ref 4.22–5.81)
RDW: 15.9 % — ABNORMAL HIGH (ref 11.5–15.5)
WBC: 6.3 10*3/uL (ref 4.0–10.5)
nRBC: 0 % (ref 0.0–0.2)

## 2021-06-12 LAB — CBG MONITORING, ED
Glucose-Capillary: 126 mg/dL — ABNORMAL HIGH (ref 70–99)
Glucose-Capillary: 123 mg/dL — ABNORMAL HIGH (ref 70–99)

## 2021-06-12 LAB — PREPARE FRESH FROZEN PLASMA: Unit division: 0

## 2021-06-12 LAB — PROTIME-INR
INR: 4.3 (ref 0.8–1.2)
Prothrombin Time: 41.3 seconds — ABNORMAL HIGH (ref 11.4–15.2)

## 2021-06-12 LAB — GLUCOSE, CAPILLARY
Glucose-Capillary: 91 mg/dL (ref 70–99)
Glucose-Capillary: 151 mg/dL — ABNORMAL HIGH (ref 70–99)

## 2021-06-12 LAB — CK: Total CK: 545 U/L — ABNORMAL HIGH (ref 49–397)

## 2021-06-12 MED ORDER — HYDROMORPHONE HCL 1 MG/ML IJ SOLN
1.0000 mg | Freq: Once | INTRAMUSCULAR | Status: AC
  Administered 2021-06-12: 1 mg via INTRAVENOUS
  Filled 2021-06-12: qty 1

## 2021-06-12 MED ORDER — INDAPAMIDE 2.5 MG PO TABS
5.0000 mg | ORAL_TABLET | Freq: Every day | ORAL | Status: DC
  Filled 2021-06-12: qty 2

## 2021-06-12 MED ORDER — PHYTONADIONE 5 MG PO TABS
2.5000 mg | ORAL_TABLET | Freq: Once | ORAL | Status: AC
  Administered 2021-06-12: 2.5 mg via ORAL
  Filled 2021-06-12: qty 1

## 2021-06-12 MED ORDER — KETOROLAC TROMETHAMINE 30 MG/ML IJ SOLN
30.0000 mg | Freq: Once | INTRAMUSCULAR | Status: AC
  Administered 2021-06-12: 30 mg via INTRAVENOUS
  Filled 2021-06-12: qty 1

## 2021-06-12 NOTE — ED Notes (Signed)
RT paged, pt to be taken off of CPAP machine and placed on 2L nasal cannula to maintain O2 sats around 92% or greater, per physician's written order.

## 2021-06-12 NOTE — Assessment & Plan Note (Addendum)
-  patient has history of CKD3a. Baseline creat ~ 1.4 -1.5, eGFR 51 -56 - monitor BMP -Creatinine still worsening; may have developed an ATN from hypoperfusion during blood loss - too difficult to obtain renal u/s at this time - for now continue IVF and trend urine output and renal function - I have communicated renal function with patient and his daughter bedside - continue holding BP meds - Repeat BMP in a.m.

## 2021-06-12 NOTE — ED Notes (Signed)
PT UPSET BC HIS OXYGEN ALARM KEPT GOING OFF AND HE COULDN'T SLEEP WELL. PT HAS SLEEP APNEA AND AWARE THAT WHEN HE FALLS ASLEEP HIS O2 LEVELS DROP AND THAT'S THE REASON HIS ALARM KEPT GOING OFF. HE STATES THAT HE WILL TALK TO SOMEONE ABOUT [place] NOT SHUTTING HIS ALARMS OFF AND PT MADE AWARE THAT ITS AGAINST POLICY TO TURN MONITOR ALARMS OFF ESP WHEN PT HAS TO BE ON CARDIAC MONITOR AND HE HAS GOTTEN MULTIPLE DOSES OF NARCOTICS FOR PAIN.

## 2021-06-12 NOTE — ED Notes (Signed)
Pt O2 sat at 89%. RT recommends increasing CPAP O2 if low. Pt increased to 3L O2. Pt A&O and in no distress. O2 sat is now at 95%.

## 2021-06-12 NOTE — ED Notes (Signed)
Report sent to floor nurse.  

## 2021-06-12 NOTE — Assessment & Plan Note (Signed)
-   continue current pain regimen

## 2021-06-12 NOTE — Assessment & Plan Note (Signed)
-   on chronic coumadin at home, currently on hold in setting of supratherapeutic INR and L knee effusion

## 2021-06-12 NOTE — Progress Notes (Signed)
I saw the patient again today in the ER.  No events overnight other than complaints of the monitor beeping incessantly which disrupted his sleeping.  I unwrapped the knee immobilizer to check the skin which is severely ecchymotic but not threatened.  He has some small fracture blisters which are expected.  His compartments are soft but edematous from post injury swelling.  He continues to have a strong dorsalis pedis pulse.  His foot is warm and well-perfused with normal capillary refill.  Neurologic exam intact.  I explained to Alexandra and his daughter at bedside that the treatment plan is to continue the knee immobilizer for now and to follow his knee injury with serial x-rays.  He would benefit from a nonemergent MRI of the left knee to evaluate the full extent of his injury which is significant.  Unfortunately surgical options are very limited due to medical complexity and presence of leg wounds that would make surgery extremely high risk for infection and loss of limb.  Based on our discussion it sounds like Gina is minimally ambulatory at baseline prior to this injury.  Unfortunately this injury will make him even less so.  Our hopes are for a stable knee joint that he can weight-bear through for transfers but he understands that he will have at least some permanent impairment to the knee as a result of all of this.  From an orthopedic standpoint there is no surgery that is planned.  To minimize swelling and hematoma formation recommend continued reversal of the INR to a therapeutic range.  Obtaining an MRI may be a challenge given patient's body habitus.  Continue local wound care to the venous stasis wounds and fracture blisters.  Azucena Cecil, MD [facility] 1:50 PM

## 2021-06-12 NOTE — ED Notes (Signed)
Pt refused insulin, stating, "No to insulin for a blood sugar of 126."

## 2021-06-12 NOTE — ED Notes (Addendum)
INR 4.3. Received from lab. Hospitalist notified and aware.

## 2021-06-12 NOTE — ED Notes (Signed)
Pt attached to cardiac monitor x3. CPAP mask in place. VSS. A&O x4. NAD.

## 2021-06-12 NOTE — Assessment & Plan Note (Signed)
-   continue qhs CPAP 

## 2021-06-12 NOTE — Assessment & Plan Note (Addendum)
-   s/p fall at home x 2 this week and again in ER on 9/15 - xray shows fracture fragments as well (presumed from proximal tibia per xray read) - knee immobilizer in place and to remain per ortho - continue bed rest  - see knee effusion also  - eventually needs MRI knee once more stable

## 2021-06-12 NOTE — Assessment & Plan Note (Addendum)
-   continue SSI and CBG monitoring  - continue Lantus and metformin

## 2021-06-12 NOTE — ED Notes (Addendum)
PT state he is on a clear diet. Hospitalist at bedside info PT diet does not have to be clear. PT state he is suppose to be on a clear diet

## 2021-06-12 NOTE — Assessment & Plan Note (Signed)
continue PPI

## 2021-06-12 NOTE — Assessment & Plan Note (Signed)
continue current regimen.

## 2021-06-12 NOTE — ED Notes (Signed)
Gave bedside report to Blima Dessert, RN

## 2021-06-12 NOTE — Assessment & Plan Note (Addendum)
-   likely mixed from ACD and hematoma - baseline Hgb around 12 g/dL - admitted with Hgb 9.9 g/dL with large LLE bruising and presumed hematoma/effusion - now is s/p 2 units PRBC total for admission and 1 unit FFP - Hgb 8.7 g/dL today; bruising on knee improving - CBC daily

## 2021-06-12 NOTE — Assessment & Plan Note (Signed)
-   no CP - holding statin and Coumadin

## 2021-06-12 NOTE — ED Notes (Signed)
Pt declines lunch tray at this time, stating, "I am not sure I want to eat." Pt declines insulin at this time, stating he, "he is not eating."

## 2021-06-12 NOTE — Progress Notes (Signed)
Progress Note    Joseph [name].   HGD:[id number]  DOB: 1956-02-07  DOA: 06/11/2021     0  PCP: Donald Prose, MD  Initial CC: falls at home, left knee pain and swelling/bruising  Hospital Course: Joseph [name]. is a 65 y.o. male with PMH morbid obesity, anxiety/depression, hx left knee DVT, PE (on chronic Coumadin), GERD, HTN, IDA, nonspecific myalgias/myositis, OSA on CPAP, PVD, polyneuropathy, DMII, HLD, multiple falls who presented to the ER after another fall.   He was initially seen in the ER on 06/08/2021 after a fall at home.  He had fallen getting out of the shower.  He underwent imaging of CT C-spine, CT head, hip/pelvis x-rays, and left knee x-ray.  There were no acute findings.  He has underlying cervical spinal stenosis but no other acute findings were seen on imaging and he was discharged home. Per his daughter, he essentially was resting in bed from ER discharge until 9/15 when he had attempted to get out of bed and again had another fall.  Appears that his left knee may have been dislocated from this fall and was replaced back in socket by EMS.  In the ER he then had another fall out of bed with left knee dislocation.  Ortho was paged and performed reduction under conscious sedation.  He again underwent multiple imaging studies in the ER on 9/15. No visible acute fractures but body habitus does limit some of the studies. His CT A/P was motion degraded and was intended to evaluate for RP bleed. Of note, BP remained stable/normal and Hgb noted to be lower than 9/12 level but imaging studies do show a left knee effusion which is a presumed large hematoma due to his supratherapeutic INR on admission which was reversed some with Vit K.   He was admitted for further pain control and monitoring of left leg for any development of compartment syndrome and further evaluation as needed.   Interval History:  Seen this morning in the ER.  Daughter is present bedside.  Reviewed events  from Monday till now.  His left lower extremity has been significantly edematous, swollen, and bruised since Monday after looking at pictures on his daughter's phone from initial episode.  They state that the bruising and swelling has worsened since his first fall. He does state that the immobilizer in place helps his pain. He underwent reduction of his knee dislocation yesterday with Ortho under conscious sedation.  ROS: Constitutional: negative for chills and fevers, Respiratory: negative for cough and sputum, Cardiovascular: negative for chest pain, and Gastrointestinal: negative for abdominal pain  Assessment & Plan: * Left knee dislocation, initial encounter - s/p fall at home x 2 this week and again in ER on 9/15 - xray shows fracture fragments as well (presumed from proximal tibia per xray read) - knee immobilizer in place and to remain per ortho - continue bed rest  - see knee effusion also   Effusion of left knee - concerned for large hematoma given amount of bruising and swelling with INR 6.2 on admission (treated with 2.5 mg Vit K) - follow up repeat INR this am = 4.3 - give another dose Vit K today and repeat INR in am - at risk for worsening; currently has severe pain in leg but DP pulse intact in LLE - continue neurovascular checks of LLE - will discuss with ortho about next steps  Normocytic anemia - likely mixed from ACD and hematoma - baseline Hgb around  12 g/dL - admitted with Hgb 9.9 g/dL with large LLE bruising and presumed hematoma/effusion - continue trending Hgb  Hx pulmonary embolism - on chronic coumadin at home, currently on hold in setting of supratherapeutic INR and L knee effusion  Coronary atherosclerosis - no CP - holding statin and Coumadin   Class 3 obesity - Complicates overall prognosis and care - Body mass index is 60.69 kg/m.  Chronic kidney disease, stage 3a ([place]) - patient has history of CKD3a. Baseline creat ~ 1.4 -1.5, eGFR 51 -56 -  monitor BMP   Diabetic neuropathy (HCC) - continue current pain regimen   Diabetes mellitus type 2, uncontrolled (HCC) - continue SSI and CBG monitoring  - continue Lantus and metformin   Esophageal reflux - continue PPI  OSA (obstructive sleep apnea) - continue qhs CPAP  Essential hypertension, benign - continue current regimen   Pure hypercholesterolemia - hold statin for now     Old records reviewed in assessment of this patient  Antimicrobials:   DVT prophylaxis: SCDs Start: 06/11/21 2153   Code Status:   Code Status: Full Code Family Communication: Daughter  Disposition Plan: Status is: Inpatient  Remains inpatient appropriate because:Ongoing active pain requiring inpatient pain management, Ongoing diagnostic testing needed not appropriate for outpatient work up, IV treatments appropriate due to intensity of illness or inability to take PO, and Inpatient level of care appropriate due to severity of illness  Dispo: The patient is from: Home              Anticipated d/c is to:  pending clinical course               Patient currently is not medically stable to d/c.   Difficult to place patient No   Risk of unplanned readmission score:     Objective: Blood pressure (!) 138/44, pulse 85, temperature 98.3 F (36.8 C), temperature source Oral, resp. rate 17, height _0  (1.854 m), weight (!) 208.7 kg, SpO2 95 %.  Examination: General appearance: alert, cooperative, no distress, and does appear uncomfortable especially with any manipulation of LLE Head: Normocephalic, without obvious abnormality, atraumatic Eyes:  EOMI Lungs:  clear sounds from anterior lung fields but distant Heart: regular rate and rhythm, S1, S2 normal, and distant heart sounds Abdomen:  obese, soft, unable to appreciate bowel sounds due to body habitus  Extremities:  LLE obese and very edematous with severe bruising/swelling and excoriated skin on anterior surface; LLE is extremely painful  with minimal palpation or movement. DP pulse intact in LLE Skin:  excoriated skin on LLE anterior surface; both legs have chronic stasis changes with indurated/flaking skin and chronic ulcers Neurologic: unable to evaluate LLE due to pain but DP pulse intact; no other focal deficits appreciated on remainder of performed limited neuro exam  Consultants:  Ortho surgery  Procedures:  Left knee reduction, 06/11/21  Data Reviewed: I have personally reviewed following labs and imaging studies Results for orders placed or performed during the hospital encounter of 06/11/21 (from the past 24 hour(s))  Protime-INR     Status: Abnormal   Collection Time: 06/11/21  2:40 PM  Result Value Ref Range   Prothrombin Time 54.7 (H) 11.4 - 15.2 seconds   INR 6.2 (HH) 0.8 - 1.2  CBC with Differential/Platelet     Status: Abnormal   Collection Time: 06/11/21  5:00 PM  Result Value Ref Range   WBC 8.0 4.0 - 10.5 K/uL   RBC 3.47 (L) 4.22 - 5.81 MIL/uL  Hemoglobin 9.9 (L) 13.0 - 17.0 g/dL   HCT 32.2 (L) 39.0 - 52.0 %   MCV 92.8 80.0 - 100.0 fL   MCH 28.5 26.0 - 34.0 pg   MCHC 30.7 30.0 - 36.0 g/dL   RDW 15.9 (H) 11.5 - 15.5 %   Platelets 250 150 - 400 K/uL   nRBC 0.0 0.0 - 0.2 %   Neutrophils Relative % 63 %   Neutro Abs 5.1 1.7 - 7.7 K/uL   Lymphocytes Relative 22 %   Lymphs Abs 1.8 0.7 - 4.0 K/uL   Monocytes Relative 8 %   Monocytes Absolute 0.7 0.1 - 1.0 K/uL   Eosinophils Relative 5 %   Eosinophils Absolute 0.4 0.0 - 0.5 K/uL   Basophils Relative 1 %   Basophils Absolute 0.1 0.0 - 0.1 K/uL   Immature Granulocytes 1 %   Abs Immature Granulocytes 0.05 0.00 - 0.07 K/uL  Comprehensive metabolic panel     Status: Abnormal   Collection Time: 06/11/21  5:00 PM  Result Value Ref Range   Sodium 135 135 - 145 mmol/L   Potassium 4.2 3.5 - 5.1 mmol/L   Chloride 101 98 - 111 mmol/L   CO2 23 22 - 32 mmol/L   Glucose, Bld 184 (H) 70 - 99 mg/dL   BUN 49 (H) 8 - 23 mg/dL   Creatinine, Ser 1.52 (H) 0.61  - 1.24 mg/dL   Calcium 8.1 (L) 8.9 - 10.3 mg/dL   Total Protein 6.0 (L) 6.5 - 8.1 g/dL   Albumin 2.8 (L) 3.5 - 5.0 g/dL   AST 29 15 - 41 U/L   ALT 18 0 - 44 U/L   Alkaline Phosphatase 68 38 - 126 U/L   Total Bilirubin 0.5 0.3 - 1.2 mg/dL   GFR, Estimated 51 (L) >60 mL/min   Anion gap 11 5 - 15  CK     Status: Abnormal   Collection Time: 06/11/21  5:00 PM  Result Value Ref Range   Total CK 618 (H) 49 - 397 U/L  I-stat chem 8, ED (not at [facility] or [facility])     Status: Abnormal   Collection Time: 06/11/21  5:08 PM  Result Value Ref Range   Sodium 137 135 - 145 mmol/L   Potassium 4.2 3.5 - 5.1 mmol/L   Chloride 104 98 - 111 mmol/L   BUN 41 (H) 8 - 23 mg/dL   Creatinine, Ser 1.50 (H) 0.61 - 1.24 mg/dL   Glucose, Bld 176 (H) 70 - 99 mg/dL   Calcium, Ion 1.09 (L) 1.15 - 1.40 mmol/L   TCO2 23 22 - 32 mmol/L   Hemoglobin 9.9 (L) 13.0 - 17.0 g/dL   HCT 29.0 (L) 39.0 - 52.0 %  Resp Panel by RT-PCR (Flu A&B, Covid) Nasopharyngeal Swab     Status: None   Collection Time: 06/11/21  5:09 PM   Specimen: Nasopharyngeal Swab; Nasopharyngeal(NP) swabs in vial transport medium  Result Value Ref Range   SARS Coronavirus 2 by RT PCR NEGATIVE NEGATIVE   Influenza A by PCR NEGATIVE NEGATIVE   Influenza B by PCR NEGATIVE NEGATIVE  Prepare fresh frozen plasma     Status: None   Collection Time: 06/11/21  7:00 PM  Result Value Ref Range   Unit Number [id number]    Blood Component Type LIQ PLASMA    Unit division 00    Status of Unit ISSUED,FINAL    Unit tag comment EMERGENCY RELEASE    Transfusion Status  OK TO TRANSFUSE Performed at [facility] [address], [place], [place] [address]   Type and screen     Status: None   Collection Time: 06/11/21  7:44 PM  Result Value Ref Range   ABO/RH(D) A POS    Antibody Screen NEG    Sample Expiration      06/14/2021,2359 Performed at [facility], [place] [address] [place], [place] [address]   CBG  monitoring, ED     Status: Abnormal   Collection Time: 06/11/21 10:51 PM  Result Value Ref Range   Glucose-Capillary 177 (H) 70 - 99 mg/dL  CBC     Status: Abnormal   Collection Time: 06/12/21  5:58 AM  Result Value Ref Range   WBC 6.3 4.0 - 10.5 K/uL   RBC 2.84 (L) 4.22 - 5.81 MIL/uL   Hemoglobin 8.1 (L) 13.0 - 17.0 g/dL   HCT 26.3 (L) 39.0 - 52.0 %   MCV 92.6 80.0 - 100.0 fL   MCH 28.5 26.0 - 34.0 pg   MCHC 30.8 30.0 - 36.0 g/dL   RDW 15.9 (H) 11.5 - 15.5 %   Platelets 229 150 - 400 K/uL   nRBC 0.0 0.0 - 0.2 %  Basic metabolic panel     Status: Abnormal   Collection Time: 06/12/21  5:58 AM  Result Value Ref Range   Sodium 138 135 - 145 mmol/L   Potassium 4.0 3.5 - 5.1 mmol/L   Chloride 104 98 - 111 mmol/L   CO2 28 22 - 32 mmol/L   Glucose, Bld 142 (H) 70 - 99 mg/dL   BUN 48 (H) 8 - 23 mg/dL   Creatinine, Ser 1.43 (H) 0.61 - 1.24 mg/dL   Calcium 7.9 (L) 8.9 - 10.3 mg/dL   GFR, Estimated 55 (L) >60 mL/min   Anion gap 6 5 - 15  CK     Status: Abnormal   Collection Time: 06/12/21  5:58 AM  Result Value Ref Range   Total CK 545 (H) 49 - 397 U/L  CBG monitoring, ED     Status: Abnormal   Collection Time: 06/12/21  7:39 AM  Result Value Ref Range   Glucose-Capillary 126 (H) 70 - 99 mg/dL  Protime-INR     Status: Abnormal   Collection Time: 06/12/21 11:14 AM  Result Value Ref Range   Prothrombin Time 41.3 (H) 11.4 - 15.2 seconds   INR 4.3 (HH) 0.8 - 1.2  BLOOD TRANSFUSION REPORT - SCANNED     Status: None   Collection Time: 06/12/21 12:15 PM   Narrative   Ordered by an unspecified provider.  CBG monitoring, ED     Status: Abnormal   Collection Time: 06/12/21 12:54 PM  Result Value Ref Range   Glucose-Capillary 123 (H) 70 - 99 mg/dL    Recent Results (from the past 240 hour(s))  Resp Panel by RT-PCR (Flu A&B, Covid) Nasopharyngeal Swab     Status: None   Collection Time: 06/11/21  5:09 PM   Specimen: Nasopharyngeal Swab; Nasopharyngeal(NP) swabs in vial transport  medium  Result Value Ref Range Status   SARS Coronavirus 2 by RT PCR NEGATIVE NEGATIVE Final    Comment: (NOTE) SARS-CoV-2 target nucleic acids are NOT DETECTED.  The SARS-CoV-2 RNA is generally detectable in upper respiratory specimens during the acute phase of infection. The lowest concentration of SARS-CoV-2 viral copies this assay can detect is 138 copies/mL. A negative result does not preclude SARS-Cov-2 infection and should not  be used as the sole basis for treatment or other patient management decisions. A negative result may occur with  improper specimen collection/handling, submission of specimen other than nasopharyngeal swab, presence of viral mutation(s) within the areas targeted by this assay, and inadequate number of viral copies(<138 copies/mL). A negative result must be combined with clinical observations, patient history, and epidemiological information. The expected result is Negative.  Fact Sheet for Patients:  EntrepreneurPulse.com.au  Fact Sheet for Healthcare Providers:  IncredibleEmployment.be  This test is no t yet approved or cleared by the [place] FDA and  has been authorized for detection and/or diagnosis of SARS-CoV-2 by FDA under an Emergency Use Authorization (EUA). This EUA will remain  in effect (meaning this test can be used) for the duration of the COVID-19 declaration under Section 564(b)(1) of the Act, 21 U.S.C.section 360bbb-3(b)(1), unless the authorization is terminated  or revoked sooner.       Influenza A by PCR NEGATIVE NEGATIVE Final   Influenza B by PCR NEGATIVE NEGATIVE Final    Comment: (NOTE) The Xpert Xpress SARS-CoV-2/FLU/RSV plus assay is intended as an aid in the diagnosis of influenza from Nasopharyngeal swab specimens and should not be used as a sole basis for treatment. Nasal washings and aspirates are unacceptable for Xpert Xpress SARS-CoV-2/FLU/RSV testing.  Fact Sheet for  Patients: EntrepreneurPulse.com.au  Fact Sheet for Healthcare Providers: IncredibleEmployment.be  This test is not yet approved or cleared by the [place] FDA and has been authorized for detection and/or diagnosis of SARS-CoV-2 by FDA under an Emergency Use Authorization (EUA). This EUA will remain in effect (meaning this test can be used) for the duration of the COVID-19 declaration under Section 564(b)(1) of the Act, 21 U.S.C. section 360bbb-3(b)(1), unless the authorization is terminated or revoked.  Performed at [facility], [place] [address], [place], [place] [address]      Radiology Studies: DG Lumbar Spine Complete  Result Date: 06/11/2021 CLINICAL DATA:  Left lower back pain after fall several days ago. EXAM: LUMBAR SPINE - COMPLETE 4+ VIEW COMPARISON:  None. FINDINGS: There is no evidence of lumbar spine fracture. Alignment is normal. Moderate degenerative disc disease is noted at T12-L1. Minimal anterior osteophyte formation is noted at L1-2. IMPRESSION: Multilevel degenerative changes are noted. No acute abnormality is noted. Aortic Atherosclerosis (ICD10-I70.0). Electronically Signed   By: Marijo Conception M.D.   On: 06/11/2021 17:52   DG Pelvis 1-2 Views  Result Date: 06/11/2021 CLINICAL DATA:  Fall.  Pelvic pain.  Initial encounter. EXAM: PELVIS - 1-2 VIEW COMPARISON:  None. FINDINGS: Patient is rotated to the left. There is no evidence of pelvic fracture or diastasis. No pelvic bone lesions are seen. IMPRESSION: Negative. Electronically Signed   By: Marlaine Hind M.D.   On: 06/11/2021 19:59   DG Knee 1-2 Views Left  Result Date: 06/11/2021 CLINICAL DATA:  Fall.  Left knee pain.  Initial encounter. EXAM: LEFT KNEE - 2 VIEW COMPARISON:  None. FINDINGS: Lateral dislocation of the knee is seen. Several bone fragments are seen inferior to the femoral condyles and medial to the proximal tibia, which are consistent with  fracture fragments which likely originate from the proximal tibia. IMPRESSION: Lateral dislocation of knee, with several fracture fragments likely originating from the proximal tibia. Electronically Signed   By: Marlaine Hind M.D.   On: 06/11/2021 20:01   DG Tibia/Fibula Left  Result Date: 06/11/2021 CLINICAL DATA:  Trauma EXAM: LEFT TIBIA AND FIBULA - 2 VIEW COMPARISON:  None. FINDINGS: There is lateral dislocation of the tibia relative to the femur with a fracture fragment distal to the medial femoral condyle, better visualized on concomitant knee radiographs. There is no distal fracture of the tibia or fracture of the fibula. IMPRESSION: Lateral dislocation of the tibia with fracture fragment distal to the medial femoral condyle. No distal fracture of the tibia or fibula. Electronically Signed   By: Ulyses Jarred M.D.   On: 06/11/2021 19:50   CT Knee Left Wo Contrast  Result Date: 06/11/2021 CLINICAL DATA:  Knee trauma fall swollen bruising EXAM: CT OF THE left KNEE WITHOUT CONTRAST TECHNIQUE: Multidetector CT imaging of the left knee was performed according to the standard protocol. Multiplanar CT image reconstructions were also generated. COMPARISON:  Radiograph, 06/11/2021 FINDINGS: Bones/Joint/Cartilage Grossly abnormal knee alignment with lateral and posterior dislocation of the proximal tibia and fibula with respect to the distal femur, the lateral femoral condyle aligns with the medial tibial plateau. There is complete lateral dislocation of the patella. There are multiple osseous fracture fragments caudal to the medial femoral condyle, adjacent to the medial tibial plateau, and along the posterior tibial articular surface. Small hemo arthrosis. Ligaments Suboptimally assessed by CT. Presumed tears of the cruciate ligaments. Presumed tear of the medial collateral ligaments. Muscles and Tendons Quadriceps tendon appears intact. Lax appearance of patellar tendon but without frank disruption by CT.  Possible incompletely visualized intramuscular hematoma within the distal semi tendinosis muscle at the distal posterior thigh, series 5, image 1. Soft tissues Extensive soft tissue edema with hematoma in the superficial soft tissues of the distal anteromedial thigh. IMPRESSION: 1. Lateral and posterior dislocation of the proximal tibia and fibula with respect to the distal femur. There are multiple osseous fragments adjacent to the articular surface of the tibia as well as the medial femoral condyle, suspect largely related to intra-articular avulsion fracture injury related to the cruciate ligaments. There are presumed tears of the cruciate ligaments as well as the medial collateral ligamentous complex. 2. Complete lateral dislocation of the patella 3. Extensive soft tissue edema in addition to hematoma within the subcutaneous soft tissues of the distal anterior thigh. Incompletely visualized suspected intramuscular hematoma within the posterior compartment of distal thigh. Electronically Signed   By: Donavan Foil M.D.   On: 06/11/2021 20:04   CT ABDOMEN PELVIS W CONTRAST  Result Date: 06/11/2021 CLINICAL DATA:  Fall with abdominal swelling. EXAM: CT ABDOMEN AND PELVIS WITH CONTRAST TECHNIQUE: Multidetector CT imaging of the abdomen and pelvis was performed using the standard protocol following bolus administration of intravenous contrast. CONTRAST:  173m OMNIPAQUE IOHEXOL 350 MG/ML SOLN COMPARISON:  12/07/2015 foot FINDINGS: Moderate degradation secondary to patient body habitus. Lower chest: Lingular scarring. Mild cardiomegaly with multivessel coronary artery calcification. Trace left pleural fluid. Hepatobiliary: Normal liver. Normal gallbladder, without biliary ductal dilatation. Pancreas: Fatty replacement throughout the pancreas. Spleen: Normal in size, without focal abnormality. Adrenals/Urinary Tract: Normal adrenal glands. Normal kidneys, without hydronephrosis. Grossly normal urinary bladder.  Stomach/Bowel: Normal stomach, without wall thickening. Normal colon, appendix, and terminal ileum. Left sided abdominal wall laxity containing nonobstructive small bowel. No pneumatosis or free intraperitoneal air. Vascular/Lymphatic: Aortic atherosclerosis. No abdominopelvic adenopathy. Reproductive: Normal prostate. Other: No significant free fluid. The far left abdominopelvic wall is not imaged. Portions of the left abdominopelvic wall are also markedly artifact degraded. Subcutaneous increased density within the left paramidline abdominal wall on 67/2 is nonspecific. Musculoskeletal: Mild S shaped thoracolumbar spine curvature. IMPRESSION: 1. Moderately degraded exam secondary to patient  body habitus. 2. Given this limitation, no acute or posttraumatic deformity identified. 3. Trace left pleural fluid. 4. Coronary artery atherosclerosis. Aortic Atherosclerosis (ICD10-I70.0). Electronically Signed   By: Abigail Miyamoto M.D.   On: 06/11/2021 19:34   DG Knee Left Port  Result Date: 06/11/2021 CLINICAL DATA:  Post reduction EXAM: PORTABLE LEFT KNEE - 1-2 VIEW COMPARISON:  06/11/2021 at 1824 hours FINDINGS: Interval reduction of the knee. Osseous densities in the medial compartment, some of which are chronic. Moderate suprapatellar knee joint effusion. IMPRESSION: Interval reduction of the knee. Moderate suprapatellar knee joint effusion. Electronically Signed   By: Julian Hy M.D.   On: 06/11/2021 20:55   DG FEMUR MIN 2 VIEWS LEFT  Result Date: 06/11/2021 CLINICAL DATA:  Fall.  Left femur pain.  Initial encounter. EXAM: LEFT FEMUR 2 VIEWS COMPARISON:  None. FINDINGS: There is no evidence of fracture or other focal bone lesions. Soft tissues are unremarkable. IMPRESSION: Negative. Electronically Signed   By: Marlaine Hind M.D.   On: 06/11/2021 19:59   DG Knee Left Port  Final Result    DG Pelvis 1-2 Views  Final Result    DG Tibia/Fibula Left  Final Result    DG Knee 1-2 Views Left  Final  Result    DG FEMUR MIN 2 VIEWS LEFT  Final Result    CT Knee Left Wo Contrast  Final Result    CT ABDOMEN PELVIS W CONTRAST  Final Result    DG Lumbar Spine Complete  Final Result      Scheduled Meds:  amLODipine  10 mg Oral QPM   cephALEXin  250 mg Oral QHS   FLUoxetine  40 mg Oral QPM   furosemide  40 mg Oral Daily   indapamide  5 mg Oral Daily   insulin aspart  0-20 Units Subcutaneous TID WC   insulin glargine-yfgn  50 Units Subcutaneous QHS   lisinopril  40 mg Oral Daily   [START ON 06/13/2021] metFORMIN  1,000 mg Oral BID WC   methocarbamol  1,000 mg Oral BID   pantoprazole  40 mg Oral Daily   phytonadione  2.5 mg Oral Once   pioglitazone  45 mg Oral Daily   tamsulosin  0.4 mg Oral Daily   PRN Meds: acetaminophen **OR** acetaminophen, HYDROmorphone (DILAUDID) injection, ondansetron **OR** ondansetron (ZOFRAN) IV, oxyCODONE, silver sulfADIAZINE Continuous Infusions:  sodium chloride       LOS: 0 days  Time spent: Greater than 50% of the 35 minute visit was spent in counseling/coordination of care for the patient as laid out in the A&P.   Dwyane Dee, MD [facility] 06/12/2021, 1:29 PM

## 2021-06-12 NOTE — Assessment & Plan Note (Signed)
-   Complicates overall prognosis and care - Body mass index is 60.69 kg/m.

## 2021-06-12 NOTE — Assessment & Plan Note (Addendum)
-   concerned for large hematoma given amount of bruising and swelling with INR 6.2 on admission (treated with total of 5 mg Vit K) -INR downtrending and responded well to vitamin K - INR now < 2 - at risk for worsening but overall has been improving since admission; pain improving, DP pulse intact - continue neurovascular checks of LLE -No surgical intervention at this time per Ortho

## 2021-06-12 NOTE — Plan of Care (Signed)

## 2021-06-12 NOTE — Hospital Course (Addendum)
Joseph [name]. is a 65 y.o. male with PMH morbid obesity, anxiety/depression, hx left knee DVT, PE (on chronic Coumadin), GERD, HTN, IDA, nonspecific myalgias/myositis, OSA on CPAP, PVD, polyneuropathy, DMII, HLD, multiple falls who presented to the ER after another fall.   He was initially seen in the ER on 06/08/2021 after a fall at home.  He had fallen getting out of the shower.  He underwent imaging of CT C-spine, CT head, hip/pelvis x-rays, and left knee x-ray.  There were no acute findings.  He has underlying cervical spinal stenosis but no other acute findings were seen on imaging and he was discharged home. Per his daughter, he essentially was resting in bed from ER discharge until 9/15 when he had attempted to get out of bed and again had another fall.  Appears that his left knee may have been dislocated from this fall and was replaced back in socket by EMS.  In the ER he then had another fall out of bed with left knee dislocation.  Ortho was paged and performed reduction under conscious sedation.  He again underwent multiple imaging studies in the ER on 9/15. No visible acute fractures but body habitus does limit some of the studies. His CT A/P was motion degraded and was intended to evaluate for RP bleed. Of note, BP remained stable/normal and Hgb noted to be lower than 9/12 level but imaging studies do show a left knee effusion which is a presumed large hematoma due to his supratherapeutic INR on admission which was reversed some with Vit K.   He was admitted for further pain control and monitoring of left leg for any development of compartment syndrome and further evaluation as needed.

## 2021-06-12 NOTE — ED Notes (Signed)
Hospitalist at the bedside 

## 2021-06-12 NOTE — Assessment & Plan Note (Signed)
-   hold statin for now 

## 2021-06-13 DIAGNOSIS — N179 Acute kidney failure, unspecified: Secondary | ICD-10-CM | POA: Diagnosis not present

## 2021-06-13 DIAGNOSIS — Z86711 Personal history of pulmonary embolism: Secondary | ICD-10-CM

## 2021-06-13 DIAGNOSIS — N1832 Chronic kidney disease, stage 3b: Secondary | ICD-10-CM

## 2021-06-13 DIAGNOSIS — S83105A Unspecified dislocation of left knee, initial encounter: Secondary | ICD-10-CM | POA: Diagnosis not present

## 2021-06-13 DIAGNOSIS — M25462 Effusion, left knee: Secondary | ICD-10-CM | POA: Diagnosis not present

## 2021-06-13 LAB — CBC WITH DIFFERENTIAL/PLATELET
Abs Immature Granulocytes: 0.05 10*3/uL (ref 0.00–0.07)
Basophils Absolute: 0.1 10*3/uL (ref 0.0–0.1)
Basophils Relative: 1 %
Eosinophils Absolute: 0.4 10*3/uL (ref 0.0–0.5)
Eosinophils Relative: 5 %
HCT: 24.6 % — ABNORMAL LOW (ref 39.0–52.0)
Hemoglobin: 7.7 g/dL — ABNORMAL LOW (ref 13.0–17.0)
Immature Granulocytes: 1 %
Lymphocytes Relative: 20 %
Lymphs Abs: 1.6 10*3/uL (ref 0.7–4.0)
MCH: 28.8 pg (ref 26.0–34.0)
MCHC: 31.3 g/dL (ref 30.0–36.0)
MCV: 92.1 fL (ref 80.0–100.0)
Monocytes Absolute: 0.7 10*3/uL (ref 0.1–1.0)
Monocytes Relative: 9 %
Neutro Abs: 5.3 10*3/uL (ref 1.7–7.7)
Neutrophils Relative %: 64 %
Platelets: 260 10*3/uL (ref 150–400)
RBC: 2.67 MIL/uL — ABNORMAL LOW (ref 4.22–5.81)
RDW: 16.1 % — ABNORMAL HIGH (ref 11.5–15.5)
WBC: 8.2 10*3/uL (ref 4.0–10.5)
nRBC: 0 % (ref 0.0–0.2)

## 2021-06-13 LAB — PROTIME-INR
INR: 3 — ABNORMAL HIGH (ref 0.8–1.2)
Prothrombin Time: 30.8 seconds — ABNORMAL HIGH (ref 11.4–15.2)

## 2021-06-13 LAB — BASIC METABOLIC PANEL
Anion gap: 7 (ref 5–15)
BUN: 51 mg/dL — ABNORMAL HIGH (ref 8–23)
CO2: 26 mmol/L (ref 22–32)
Calcium: 7.5 mg/dL — ABNORMAL LOW (ref 8.9–10.3)
Chloride: 102 mmol/L (ref 98–111)
Creatinine, Ser: 2.19 mg/dL — ABNORMAL HIGH (ref 0.61–1.24)
GFR, Estimated: 33 mL/min — ABNORMAL LOW (ref >60)
Glucose, Bld: 147 mg/dL — ABNORMAL HIGH (ref 70–99)
Potassium: 4.2 mmol/L (ref 3.5–5.1)
Sodium: 135 mmol/L (ref 135–145)

## 2021-06-13 LAB — GLUCOSE, CAPILLARY
Glucose-Capillary: 142 mg/dL — ABNORMAL HIGH (ref 70–99)
Glucose-Capillary: 123 mg/dL — ABNORMAL HIGH (ref 70–99)
Glucose-Capillary: 161 mg/dL — ABNORMAL HIGH (ref 70–99)
Glucose-Capillary: 223 mg/dL — ABNORMAL HIGH (ref 70–99)
Glucose-Capillary: 216 mg/dL — ABNORMAL HIGH (ref 70–99)

## 2021-06-13 LAB — MAGNESIUM: Magnesium: 2.3 mg/dL (ref 1.7–2.4)

## 2021-06-13 LAB — PREPARE RBC (CROSSMATCH)

## 2021-06-13 MED ORDER — SODIUM CHLORIDE 0.9% IV SOLUTION: Freq: Once | INTRAVENOUS | Status: AC

## 2021-06-13 MED ORDER — PHYTONADIONE 5 MG PO TABS
2.5000 mg | ORAL_TABLET | Freq: Once | ORAL | Status: AC
  Administered 2021-06-13: 2.5 mg via ORAL
  Filled 2021-06-13: qty 1

## 2021-06-13 MED ORDER — INSULIN ASPART 100 UNIT/ML IJ SOLN
11.0000 [IU] | Freq: Once | INTRAMUSCULAR | Status: AC
  Administered 2021-06-13: 11 [IU] via SUBCUTANEOUS

## 2021-06-13 MED ORDER — CALCIUM GLUCONATE-NACL 2-0.675 GM/100ML-% IV SOLN
2.0000 g | Freq: Once | INTRAVENOUS | Status: AC
  Administered 2021-06-13: 2000 mg via INTRAVENOUS
  Filled 2021-06-13: qty 100

## 2021-06-13 NOTE — Progress Notes (Signed)
Progress Note    Joseph [name].   KPT:[id number]  DOB: March 17, 1956  DOA: 06/11/2021     1  PCP: Donald Prose, MD  Initial CC: falls at home, left knee pain and swelling/bruising  Hospital Course: Joseph [name]. is a 65 y.o. male with PMH morbid obesity, anxiety/depression, hx left knee DVT, PE (on chronic Coumadin), GERD, HTN, IDA, nonspecific myalgias/myositis, OSA on CPAP, PVD, polyneuropathy, DMII, HLD, multiple falls who presented to the ER after another fall.   He was initially seen in the ER on 06/08/2021 after a fall at home.  He had fallen getting out of the shower.  He underwent imaging of CT C-spine, CT head, hip/pelvis x-rays, and left knee x-ray.  There were no acute findings.  He has underlying cervical spinal stenosis but no other acute findings were seen on imaging and he was discharged home. Per his daughter, he essentially was resting in bed from ER discharge until 9/15 when he had attempted to get out of bed and again had another fall.  Appears that his left knee may have been dislocated from this fall and was replaced back in socket by EMS.  In the ER he then had another fall out of bed with left knee dislocation.  Ortho was paged and performed reduction under conscious sedation.  He again underwent multiple imaging studies in the ER on 9/15. No visible acute fractures but body habitus does limit some of the studies. His CT A/P was motion degraded and was intended to evaluate for RP bleed. Of note, BP remained stable/normal and Hgb noted to be lower than 9/12 level but imaging studies do show a left knee effusion which is a presumed large hematoma due to his supratherapeutic INR on admission which was reversed some with Vit K.   He was admitted for further pain control and monitoring of left leg for any development of compartment syndrome and further evaluation as needed.   Interval History:  No events overnight.  Actually states that his left lower extremity pain  feels a little better today.  Still very tender with minimal palpation however.  He now has a large bulla noted over his knee filled with blood. Discussed about giving blood transfusion today, he is amenable. Still has good sensation in lower extremities.  ROS: Constitutional: negative for chills and fevers, Respiratory: negative for cough and sputum, Cardiovascular: negative for chest pain, and Gastrointestinal: negative for abdominal pain  Assessment & Plan: * Left knee dislocation, initial encounter - s/p fall at home x 2 this week and again in ER on 9/15 - xray shows fracture fragments as well (presumed from proximal tibia per xray read) - knee immobilizer in place and to remain per ortho - continue bed rest  - see knee effusion also   Effusion of left knee - concerned for large hematoma given amount of bruising and swelling with INR 6.2 on admission (treated with 2.5 mg Vit K) -INR downtrending, still slightly above goal today.  Giving another dose of vitamin K - Follow-up INR tomorrow - at risk for worsening; currently has severe pain in leg but DP pulse intact in LLE - continue neurovascular checks of LLE -No surgical intervention at this time per Ortho  Normocytic anemia - likely mixed from ACD and hematoma - baseline Hgb around 12 g/dL - admitted with Hgb 9.9 g/dL with large LLE bruising and presumed hematoma/effusion - continue trending Hgb -Hemoglobin down to 7.7 g/dL this morning and now with  sanguinous bulla noted on his knee.  Ordering 1 unit PRBC for transfusion - Follow-up hemoglobin  Acute renal failure superimposed on stage 3b chronic kidney disease ([place]) - patient has history of CKD3a. Baseline creat ~ 1.4 -1.5, eGFR 51 -56 - monitor BMP -Creatinine worsening today, 2.19 - Discontinue BP meds (diastolics appear low but accuracy questionable given body habitus) and hold diuretics - Hold off on fluids as giving blood today - Repeat BMP in a.m.   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  Diabetic neuropathy ([place]) - continue current pain regimen   Diabetes mellitus type 2, uncontrolled ([place]) - continue SSI and CBG monitoring  - continue Lantus and metformin   Esophageal reflux - continue PPI  OSA (obstructive sleep apnea) - continue qhs CPAP  Essential hypertension, benign - continue current regimen   Pure hypercholesterolemia - hold statin for now    Old records reviewed in assessment of this patient  Antimicrobials:   DVT prophylaxis: SCDs Start: 06/11/21 2153   Code Status:   Code Status: Full Code Family Communication: Daughter  Disposition Plan: Status is: Inpatient  Remains inpatient appropriate because:Ongoing active pain requiring inpatient pain management, Ongoing diagnostic testing needed not appropriate for outpatient work up, IV treatments appropriate due to intensity of illness or inability to take PO, and Inpatient level of care appropriate due to severity of illness  Dispo: The patient is from: Home              Anticipated d/c is to:  pending clinical course               Patient currently is not medically stable to d/c.   Difficult to place patient No   Risk of unplanned readmission score: Unplanned Admission- Pilot do not use: 21.43   Objective: Blood pressure (!) 143/48, pulse 78, temperature 98.9 F (37.2 C), temperature source Oral, resp. rate 17, height 6' 1"  (1.854 m), weight (!) 208.7 kg, SpO2 95 %.  Examination: General appearance: alert, cooperative, no distress, and more comfortable appearing today but still has pain with minimal palpation in left leg Head: Normocephalic, without obvious abnormality, atraumatic Eyes:  EOMI Lungs:  clear sounds  from anterior lung fields but distant Heart: regular rate and rhythm, S1, S2 normal, and distant heart sounds Abdomen:  obese, soft, unable to appreciate bowel sounds due to body habitus  Extremities:  LLE obese and very edematous with severe bruising/swelling and excoriated skin on anterior surface; LLE is extremely painful with minimal palpation or movement. DP pulse intact in LLE Skin:  excoriated skin on LLE anterior surface; both legs have chronic stasis changes with indurated/flaking skin and chronic ulcers Neurologic: unable to evaluate LLE due to pain but DP pulse intact; no other focal deficits appreciated on remainder of performed limited neuro exam  Pic taken 9/17:   Consultants:  Ortho surgery  Procedures:  Left knee reduction, 06/11/21  Data Reviewed: I have personally reviewed following labs and imaging studies Results for orders placed or performed during the hospital encounter of 06/11/21 (from the past 24 hour(s))  CBG monitoring, ED     Status: Abnormal   Collection Time: 06/12/21 12:54 PM  Result Value Ref Range   Glucose-Capillary 123 (H) 70 -  99 mg/dL  Glucose, capillary     Status: None   Collection Time: 06/12/21  5:09 PM  Result Value Ref Range   Glucose-Capillary 91 70 - 99 mg/dL  Glucose, capillary     Status: Abnormal   Collection Time: 06/12/21 10:00 PM  Result Value Ref Range   Glucose-Capillary 151 (H) 70 - 99 mg/dL  Glucose, capillary     Status: Abnormal   Collection Time: 06/13/21  2:29 AM  Result Value Ref Range   Glucose-Capillary 142 (H) 70 - 99 mg/dL  Protime-INR     Status: Abnormal   Collection Time: 06/13/21  3:25 AM  Result Value Ref Range   Prothrombin Time 30.8 (H) 11.4 - 15.2 seconds   INR 3.0 (H) 0.8 - 1.2  Basic metabolic panel     Status: Abnormal   Collection Time: 06/13/21  3:25 AM  Result Value Ref Range   Sodium 135 135 - 145 mmol/L   Potassium 4.2 3.5 - 5.1 mmol/L   Chloride 102 98 - 111 mmol/L   CO2 26 22 - 32 mmol/L    Glucose, Bld 147 (H) 70 - 99 mg/dL   BUN 51 (H) 8 - 23 mg/dL   Creatinine, Ser 2.19 (H) 0.61 - 1.24 mg/dL   Calcium 7.5 (L) 8.9 - 10.3 mg/dL   GFR, Estimated 33 (L) >60 mL/min   Anion gap 7 5 - 15  CBC with Differential/Platelet     Status: Abnormal   Collection Time: 06/13/21  3:25 AM  Result Value Ref Range   WBC 8.2 4.0 - 10.5 K/uL   RBC 2.67 (L) 4.22 - 5.81 MIL/uL   Hemoglobin 7.7 (L) 13.0 - 17.0 g/dL   HCT 24.6 (L) 39.0 - 52.0 %   MCV 92.1 80.0 - 100.0 fL   MCH 28.8 26.0 - 34.0 pg   MCHC 31.3 30.0 - 36.0 g/dL   RDW 16.1 (H) 11.5 - 15.5 %   Platelets 260 150 - 400 K/uL   nRBC 0.0 0.0 - 0.2 %   Neutrophils Relative % 64 %   Neutro Abs 5.3 1.7 - 7.7 K/uL   Lymphocytes Relative 20 %   Lymphs Abs 1.6 0.7 - 4.0 K/uL   Monocytes Relative 9 %   Monocytes Absolute 0.7 0.1 - 1.0 K/uL   Eosinophils Relative 5 %   Eosinophils Absolute 0.4 0.0 - 0.5 K/uL   Basophils Relative 1 %   Basophils Absolute 0.1 0.0 - 0.1 K/uL   Immature Granulocytes 1 %   Abs Immature Granulocytes 0.05 0.00 - 0.07 K/uL  Magnesium     Status: None   Collection Time: 06/13/21  3:25 AM  Result Value Ref Range   Magnesium 2.3 1.7 - 2.4 mg/dL  Glucose, capillary     Status: Abnormal   Collection Time: 06/13/21  7:14 AM  Result Value Ref Range   Glucose-Capillary 123 (H) 70 - 99 mg/dL  Prepare RBC (crossmatch)     Status: None   Collection Time: 06/13/21  8:12 AM  Result Value Ref Range   Order Confirmation      ORDER PROCESSED BY BLOOD BANK Performed at [facility], [place] [address], [place], [place] [address]   Glucose, capillary     Status: Abnormal   Collection Time: 06/13/21 11:14 AM  Result Value Ref Range   Glucose-Capillary 161 (H) 70 - 99 mg/dL    Recent Results (from the past 240 hour(s))  Resp Panel by RT-PCR (Flu A&B, Covid)  Nasopharyngeal Swab     Status: None   Collection Time: 06/11/21  5:09 PM   Specimen: Nasopharyngeal Swab; Nasopharyngeal(NP) swabs in vial transport  medium  Result Value Ref Range Status   SARS Coronavirus 2 by RT PCR NEGATIVE NEGATIVE Final    Comment: (NOTE) SARS-CoV-2 target nucleic acids are NOT DETECTED.  The SARS-CoV-2 RNA is generally detectable in upper respiratory specimens during the acute phase of infection. The lowest concentration of SARS-CoV-2 viral copies this assay can detect is 138 copies/mL. A negative result does not preclude SARS-Cov-2 infection and should not be used as the sole basis for treatment or other patient management decisions. A negative result may occur with  improper specimen collection/handling, submission of specimen other than nasopharyngeal swab, presence of viral mutation(s) within the areas targeted by this assay, and inadequate number of viral copies(<138 copies/mL). A negative result must be combined with clinical observations, patient history, and epidemiological information. The expected result is Negative.  Fact Sheet for Patients:  EntrepreneurPulse.com.au  Fact Sheet for Healthcare Providers:  IncredibleEmployment.be  This test is no t yet approved or cleared by the [place] FDA and  has been authorized for detection and/or diagnosis of SARS-CoV-2 by FDA under an Emergency Use Authorization (EUA). This EUA will remain  in effect (meaning this test can be used) for the duration of the COVID-19 declaration under Section 564(b)(1) of the Act, 21 U.S.C.section 360bbb-3(b)(1), unless the authorization is terminated  or revoked sooner.       Influenza A by PCR NEGATIVE NEGATIVE Final   Influenza B by PCR NEGATIVE NEGATIVE Final    Comment: (NOTE) The Xpert Xpress SARS-CoV-2/FLU/RSV plus assay is intended as an aid in the diagnosis of influenza from Nasopharyngeal swab specimens and should not be used as a sole basis for treatment. Nasal washings and aspirates are unacceptable for Xpert Xpress SARS-CoV-2/FLU/RSV testing.  Fact Sheet for  Patients: EntrepreneurPulse.com.au  Fact Sheet for Healthcare Providers: IncredibleEmployment.be  This test is not yet approved or cleared by the [place] FDA and has been authorized for detection and/or diagnosis of SARS-CoV-2 by FDA under an Emergency Use Authorization (EUA). This EUA will remain in effect (meaning this test can be used) for the duration of the COVID-19 declaration under Section 564(b)(1) of the Act, 21 U.S.C. section 360bbb-3(b)(1), unless the authorization is terminated or revoked.  Performed at [facility], [place] [address], [place], [place] [address]      Radiology Studies: DG Lumbar Spine Complete  Result Date: 06/11/2021 CLINICAL DATA:  Left lower back pain after fall several days ago. EXAM: LUMBAR SPINE - COMPLETE 4+ VIEW COMPARISON:  None. FINDINGS: There is no evidence of lumbar spine fracture. Alignment is normal. Moderate degenerative disc disease is noted at T12-L1. Minimal anterior osteophyte formation is noted at L1-2. IMPRESSION: Multilevel degenerative changes are noted. No acute abnormality is noted. Aortic Atherosclerosis (ICD10-I70.0). Electronically Signed   By: Marijo Conception M.D.   On: 06/11/2021 17:52   DG Pelvis 1-2 Views  Result Date: 06/11/2021 CLINICAL DATA:  Fall.  Pelvic pain.  Initial encounter. EXAM: PELVIS - 1-2 VIEW COMPARISON:  None. FINDINGS: Patient is rotated to the left. There is no evidence of pelvic fracture or diastasis. No pelvic bone lesions are seen. IMPRESSION: Negative. Electronically Signed   By: Marlaine Hind M.D.   On: 06/11/2021 19:59   DG Knee 1-2 Views Left  Result Date: 06/11/2021 CLINICAL DATA:  Fall.  Left knee pain.  Initial encounter.  EXAM: LEFT KNEE - 2 VIEW COMPARISON:  None. FINDINGS: Lateral dislocation of the knee is seen. Several bone fragments are seen inferior to the femoral condyles and medial to the proximal tibia, which are consistent with  fracture fragments which likely originate from the proximal tibia. IMPRESSION: Lateral dislocation of knee, with several fracture fragments likely originating from the proximal tibia. Electronically Signed   By: Marlaine Hind M.D.   On: 06/11/2021 20:01   DG Tibia/Fibula Left  Result Date: 06/11/2021 CLINICAL DATA:  Trauma EXAM: LEFT TIBIA AND FIBULA - 2 VIEW COMPARISON:  None. FINDINGS: There is lateral dislocation of the tibia relative to the femur with a fracture fragment distal to the medial femoral condyle, better visualized on concomitant knee radiographs. There is no distal fracture of the tibia or fracture of the fibula. IMPRESSION: Lateral dislocation of the tibia with fracture fragment distal to the medial femoral condyle. No distal fracture of the tibia or fibula. Electronically Signed   By: Ulyses Jarred M.D.   On: 06/11/2021 19:50   CT Knee Left Wo Contrast  Result Date: 06/11/2021 CLINICAL DATA:  Knee trauma fall swollen bruising EXAM: CT OF THE left KNEE WITHOUT CONTRAST TECHNIQUE: Multidetector CT imaging of the left knee was performed according to the standard protocol. Multiplanar CT image reconstructions were also generated. COMPARISON:  Radiograph, 06/11/2021 FINDINGS: Bones/Joint/Cartilage Grossly abnormal knee alignment with lateral and posterior dislocation of the proximal tibia and fibula with respect to the distal femur, the lateral femoral condyle aligns with the medial tibial plateau. There is complete lateral dislocation of the patella. There are multiple osseous fracture fragments caudal to the medial femoral condyle, adjacent to the medial tibial plateau, and along the posterior tibial articular surface. Small hemo arthrosis. Ligaments Suboptimally assessed by CT. Presumed tears of the cruciate ligaments. Presumed tear of the medial collateral ligaments. Muscles and Tendons Quadriceps tendon appears intact. Lax appearance of patellar tendon but without frank disruption by CT.  Possible incompletely visualized intramuscular hematoma within the distal semi tendinosis muscle at the distal posterior thigh, series 5, image 1. Soft tissues Extensive soft tissue edema with hematoma in the superficial soft tissues of the distal anteromedial thigh. IMPRESSION: 1. Lateral and posterior dislocation of the proximal tibia and fibula with respect to the distal femur. There are multiple osseous fragments adjacent to the articular surface of the tibia as well as the medial femoral condyle, suspect largely related to intra-articular avulsion fracture injury related to the cruciate ligaments. There are presumed tears of the cruciate ligaments as well as the medial collateral ligamentous complex. 2. Complete lateral dislocation of the patella 3. Extensive soft tissue edema in addition to hematoma within the subcutaneous soft tissues of the distal anterior thigh. Incompletely visualized suspected intramuscular hematoma within the posterior compartment of distal thigh. Electronically Signed   By: Donavan Foil M.D.   On: 06/11/2021 20:04   CT ABDOMEN PELVIS W CONTRAST  Result Date: 06/11/2021 CLINICAL DATA:  Fall with abdominal swelling. EXAM: CT ABDOMEN AND PELVIS WITH CONTRAST TECHNIQUE: Multidetector CT imaging of the abdomen and pelvis was performed using the standard protocol following bolus administration of intravenous contrast. CONTRAST:  14m OMNIPAQUE IOHEXOL 350 MG/ML SOLN COMPARISON:  12/07/2015 foot FINDINGS: Moderate degradation secondary to patient body habitus. Lower chest: Lingular scarring. Mild cardiomegaly with multivessel coronary artery calcification. Trace left pleural fluid. Hepatobiliary: Normal liver. Normal gallbladder, without biliary ductal dilatation. Pancreas: Fatty replacement throughout the pancreas. Spleen: Normal in size, without focal abnormality. Adrenals/Urinary Tract: Normal  adrenal glands. Normal kidneys, without hydronephrosis. Grossly normal urinary bladder.  Stomach/Bowel: Normal stomach, without wall thickening. Normal colon, appendix, and terminal ileum. Left sided abdominal wall laxity containing nonobstructive small bowel. No pneumatosis or free intraperitoneal air. Vascular/Lymphatic: Aortic atherosclerosis. No abdominopelvic adenopathy. Reproductive: Normal prostate. Other: No significant free fluid. The far left abdominopelvic wall is not imaged. Portions of the left abdominopelvic wall are also markedly artifact degraded. Subcutaneous increased density within the left paramidline abdominal wall on 67/2 is nonspecific. Musculoskeletal: Mild S shaped thoracolumbar spine curvature. IMPRESSION: 1. Moderately degraded exam secondary to patient body habitus. 2. Given this limitation, no acute or posttraumatic deformity identified. 3. Trace left pleural fluid. 4. Coronary artery atherosclerosis. Aortic Atherosclerosis (ICD10-I70.0). Electronically Signed   By: Abigail Miyamoto M.D.   On: 06/11/2021 19:34   DG Knee Left Port  Result Date: 06/11/2021 CLINICAL DATA:  Post reduction EXAM: PORTABLE LEFT KNEE - 1-2 VIEW COMPARISON:  06/11/2021 at 1824 hours FINDINGS: Interval reduction of the knee. Osseous densities in the medial compartment, some of which are chronic. Moderate suprapatellar knee joint effusion. IMPRESSION: Interval reduction of the knee. Moderate suprapatellar knee joint effusion. Electronically Signed   By: Julian Hy M.D.   On: 06/11/2021 20:55   DG FEMUR MIN 2 VIEWS LEFT  Result Date: 06/11/2021 CLINICAL DATA:  Fall.  Left femur pain.  Initial encounter. EXAM: LEFT FEMUR 2 VIEWS COMPARISON:  None. FINDINGS: There is no evidence of fracture or other focal bone lesions. Soft tissues are unremarkable. IMPRESSION: Negative. Electronically Signed   By: Marlaine Hind M.D.   On: 06/11/2021 19:59   DG Knee Left Port  Final Result    DG Pelvis 1-2 Views  Final Result    DG Tibia/Fibula Left  Final Result    DG Knee 1-2 Views Left  Final  Result    DG FEMUR MIN 2 VIEWS LEFT  Final Result    CT Knee Left Wo Contrast  Final Result    CT ABDOMEN PELVIS W CONTRAST  Final Result    DG Lumbar Spine Complete  Final Result      Scheduled Meds:  cephALEXin  250 mg Oral QHS   FLUoxetine  40 mg Oral QPM   insulin aspart  0-20 Units Subcutaneous TID WC   insulin glargine-yfgn  50 Units Subcutaneous QHS   methocarbamol  1,000 mg Oral BID   pantoprazole  40 mg Oral Daily   pioglitazone  45 mg Oral Daily   tamsulosin  0.4 mg Oral Daily   PRN Meds: acetaminophen **OR** acetaminophen, HYDROmorphone (DILAUDID) injection, ondansetron **OR** ondansetron (ZOFRAN) IV, oxyCODONE, silver sulfADIAZINE Continuous Infusions:     LOS: 1 day  Time spent: Greater than 50% of the 35 minute visit was spent in counseling/coordination of care for the patient as laid out in the A&P.   Dwyane Dee, MD [facility] 06/13/2021, 12:28 PM

## 2021-06-13 NOTE — Progress Notes (Signed)
Patient on home CPAP at this time.

## 2021-06-13 NOTE — Progress Notes (Signed)
Patient has own insulin scanner and will give 11 units in addition to 1700 sliding scale dose per MD order.

## 2021-06-14 DIAGNOSIS — M25462 Effusion, left knee: Secondary | ICD-10-CM | POA: Diagnosis not present

## 2021-06-14 DIAGNOSIS — N1832 Chronic kidney disease, stage 3b: Secondary | ICD-10-CM | POA: Diagnosis not present

## 2021-06-14 DIAGNOSIS — S83105A Unspecified dislocation of left knee, initial encounter: Secondary | ICD-10-CM | POA: Diagnosis not present

## 2021-06-14 DIAGNOSIS — N179 Acute kidney failure, unspecified: Secondary | ICD-10-CM | POA: Diagnosis not present

## 2021-06-14 LAB — CBC WITH DIFFERENTIAL/PLATELET
Abs Immature Granulocytes: 0.05 10*3/uL (ref 0.00–0.07)
Basophils Absolute: 0.1 10*3/uL (ref 0.0–0.1)
Basophils Relative: 1 %
Eosinophils Absolute: 0.3 10*3/uL (ref 0.0–0.5)
Eosinophils Relative: 5 %
HCT: 24.9 % — ABNORMAL LOW (ref 39.0–52.0)
Hemoglobin: 7.8 g/dL — ABNORMAL LOW (ref 13.0–17.0)
Immature Granulocytes: 1 %
Lymphocytes Relative: 20 %
Lymphs Abs: 1.5 10*3/uL (ref 0.7–4.0)
MCH: 28.9 pg (ref 26.0–34.0)
MCHC: 31.3 g/dL (ref 30.0–36.0)
MCV: 92.2 fL (ref 80.0–100.0)
Monocytes Absolute: 0.7 10*3/uL (ref 0.1–1.0)
Monocytes Relative: 9 %
Neutro Abs: 4.8 10*3/uL (ref 1.7–7.7)
Neutrophils Relative %: 64 %
Platelets: 255 10*3/uL (ref 150–400)
RBC: 2.7 MIL/uL — ABNORMAL LOW (ref 4.22–5.81)
RDW: 15.8 % — ABNORMAL HIGH (ref 11.5–15.5)
WBC: 7.4 10*3/uL (ref 4.0–10.5)
nRBC: 0.3 % — ABNORMAL HIGH (ref 0.0–0.2)

## 2021-06-14 LAB — BASIC METABOLIC PANEL
Anion gap: 8 (ref 5–15)
BUN: 66 mg/dL — ABNORMAL HIGH (ref 8–23)
CO2: 26 mmol/L (ref 22–32)
Calcium: 7.6 mg/dL — ABNORMAL LOW (ref 8.9–10.3)
Chloride: 101 mmol/L (ref 98–111)
Creatinine, Ser: 2.87 mg/dL — ABNORMAL HIGH (ref 0.61–1.24)
GFR, Estimated: 24 mL/min — ABNORMAL LOW (ref >60)
Glucose, Bld: 237 mg/dL — ABNORMAL HIGH (ref 70–99)
Potassium: 4.6 mmol/L (ref 3.5–5.1)
Sodium: 135 mmol/L (ref 135–145)

## 2021-06-14 LAB — PROTIME-INR
INR: 1.5 — ABNORMAL HIGH (ref 0.8–1.2)
Prothrombin Time: 17.8 seconds — ABNORMAL HIGH (ref 11.4–15.2)

## 2021-06-14 LAB — MAGNESIUM: Magnesium: 2.2 mg/dL (ref 1.7–2.4)

## 2021-06-14 LAB — GLUCOSE, CAPILLARY
Glucose-Capillary: 242 mg/dL — ABNORMAL HIGH (ref 70–99)
Glucose-Capillary: 226 mg/dL — ABNORMAL HIGH (ref 70–99)
Glucose-Capillary: 195 mg/dL — ABNORMAL HIGH (ref 70–99)
Glucose-Capillary: 187 mg/dL — ABNORMAL HIGH (ref 70–99)
Glucose-Capillary: 207 mg/dL — ABNORMAL HIGH (ref 70–99)

## 2021-06-14 LAB — PREPARE RBC (CROSSMATCH)

## 2021-06-14 MED ORDER — SODIUM CHLORIDE 0.9 % IV SOLN: INTRAVENOUS | Status: DC

## 2021-06-14 MED ORDER — SODIUM CHLORIDE 0.9% IV SOLUTION: Freq: Once | INTRAVENOUS | Status: AC

## 2021-06-14 NOTE — Progress Notes (Signed)
Patient on his home CPAP at this time.

## 2021-06-14 NOTE — Plan of Care (Signed)
  Problem: Education: Goal: Knowledge of General Education information will improve Description Including pain rating scale, medication(s)/side effects and non-pharmacologic comfort measures Outcome: Progressing   Problem: Clinical Measurements: Goal: Ability to maintain clinical measurements within normal limits will improve Outcome: Progressing   Problem: Clinical Measurements: Goal: Will remain free from infection Outcome: Progressing   

## 2021-06-14 NOTE — Progress Notes (Signed)
Progress Note    Joseph [name].   ZOX:[id number]  DOB: [birth year]  DOA: 06/11/2021     2  PCP: Donald Prose, MD  Initial CC: falls at home, left knee pain and swelling/bruising  Hospital Course: Joseph [name]. is a 65 y.o. male with PMH morbid obesity, anxiety/depression, hx left knee DVT, PE (on chronic Coumadin), GERD, HTN, IDA, nonspecific myalgias/myositis, OSA on CPAP, PVD, polyneuropathy, DMII, HLD, multiple falls who presented to the ER after another fall.   He was initially seen in the ER on 06/08/2021 after a fall at home.  He had fallen getting out of the shower.  He underwent imaging of CT C-spine, CT head, hip/pelvis x-rays, and left knee x-ray.  There were no acute findings.  He has underlying cervical spinal stenosis but no other acute findings were seen on imaging and he was discharged home. Per his daughter, he essentially was resting in bed from ER discharge until 9/15 when he had attempted to get out of bed and again had another fall.  Appears that his left knee may have been dislocated from this fall and was replaced back in socket by EMS.  In the ER he then had another fall out of bed with left knee dislocation.  Ortho was paged and performed reduction under conscious sedation.  He again underwent multiple imaging studies in the ER on 9/15. No visible acute fractures but body habitus does limit some of the studies. His CT A/P was motion degraded and was intended to evaluate for RP bleed. Of note, BP remained stable/normal and Hgb noted to be lower than 9/12 level but imaging studies do show a left knee effusion which is a presumed large hematoma due to his supratherapeutic INR on admission which was reversed some with Vit K.   He was admitted for further pain control and monitoring of left leg for any development of compartment syndrome and further evaluation as needed.   Interval History:  No events overnight.  States that his pain feels better today compared to  yesterday and day before.  Able to touch his left lower extremity more than before. Discussed renal function findings with him and his daughter bedside.  Questions were answered and update given.  His mother was also present. Dietitian consulted as they were concerned about patient's appetite being poor and starting some supplements.  ROS: Constitutional: negative for chills and fevers, Respiratory: negative for cough and sputum, Cardiovascular: negative for chest pain, and Gastrointestinal: negative for abdominal pain  Assessment & Plan: * Left knee dislocation, initial encounter - s/p fall at home x 2 this week and again in ER on 9/15 - xray shows fracture fragments as well (presumed from proximal tibia per xray read) - knee immobilizer in place and to remain per ortho - continue bed rest  - see knee effusion also   Effusion of left knee - concerned for large hematoma given amount of bruising and swelling with INR 6.2 on admission (treated with total of 5 mg Vit K) -INR downtrending and responded well to vitamin K - INR now < 2 - at risk for worsening; currently has severe pain in leg but DP pulse intact in LLE - continue neurovascular checks of LLE -No surgical intervention at this time per Ortho  Normocytic anemia - likely mixed from ACD and hematoma - baseline Hgb around 12 g/dL - admitted with Hgb 9.9 g/dL with large LLE bruising and presumed hematoma/effusion - continue trending Hgb -Hemoglobin down  to 7.8 g/dL this morning; will give another unit of blood today (total of 2 units for hospitalization)  Acute renal failure superimposed on stage 3b chronic kidney disease ([place]) - patient has history of CKD3a. Baseline creat ~ 1.4 -1.5, eGFR 51 -56 - monitor BMP -Creatinine still worsening; may have developed an ATN from hypoperfusion during blood loss - too difficult to obtain renal u/s at this time - for now continue IVF and trend urine output and renal function - I have  communicated renal function with patient and his daughter bedside - continue holding BP meds - Repeat BMP in a.m.   Hx pulmonary embolism - on chronic coumadin at home, currently on hold in setting of supratherapeutic INR and L knee effusion  Coronary atherosclerosis - no CP - holding statin and Coumadin   Class 3 obesity - Complicates overall prognosis and care - Body mass index is 60.69 kg/m.  Diabetic neuropathy ([place]) - continue current pain regimen   Diabetes mellitus type 2, uncontrolled ([place]) - continue SSI and CBG monitoring  - continue Lantus and metformin   Esophageal reflux - continue PPI  OSA (obstructive sleep apnea) - continue qhs CPAP  Essential hypertension, benign - continue current regimen   Pure hypercholesterolemia - hold statin for now    Old records reviewed in assessment of this patient  Antimicrobials:   DVT prophylaxis: SCDs Start: 06/11/21 2153   Code Status:   Code Status: Full Code Family Communication: Daughter  Disposition Plan: Status is: Inpatient  Remains inpatient appropriate because:Ongoing active pain requiring inpatient pain management, Ongoing diagnostic testing needed not appropriate for outpatient work up, IV treatments appropriate due to intensity of illness or inability to take PO, and Inpatient level of care appropriate due to severity of illness  Dispo: The patient is from: Home              Anticipated d/c is to:  pending clinical course               Patient currently is not medically stable to d/c.   Difficult to place patient No   Risk of unplanned readmission score: Unplanned Admission- Pilot do not use: 21.94   Objective: Blood pressure (!) 146/48, pulse 77, temperature 99.2 F (37.3 C), temperature source Oral, resp. rate 17, height 6' 1"  (1.854 m), weight (!) 208.7 kg, SpO2 92 %.  Examination: General appearance: alert, cooperative, no distress, and more comfortable appearing today but still has pain  with minimal palpation in left leg Head: Normocephalic, without obvious abnormality, atraumatic Eyes:  EOMI Lungs:  clear sounds from anterior lung fields but distant Heart: regular rate and rhythm, S1, S2 normal, and distant heart sounds Abdomen:  obese, soft, unable to appreciate bowel sounds due to body habitus  Extremities:  LLE obese and very edematous with severe bruising/swelling and excoriated skin on anterior surface; LLE is now less tender with palpation (able to palpate medial and lateral distal thigh which could not be done on admission); DP pulse intact in LLE Skin:  excoriated skin on LLE anterior surface; both legs have chronic stasis changes with indurated/flaking skin and chronic ulcers Neurologic: unable to evaluate LLE due to pain but DP pulse intact; no other focal deficits appreciated on remainder of performed limited neuro exam  Pic taken 9/18:    Consultants:  Ortho surgery  Procedures:  Left knee reduction, 06/11/21  Data Reviewed: I have personally reviewed following labs and imaging studies Results for orders placed or performed during  the hospital encounter of 06/11/21 (from the past 24 hour(s))  Glucose, capillary     Status: Abnormal   Collection Time: 06/13/21  4:03 PM  Result Value Ref Range   Glucose-Capillary 223 (H) 70 - 99 mg/dL  Glucose, capillary     Status: Abnormal   Collection Time: 06/13/21  9:17 PM  Result Value Ref Range   Glucose-Capillary 216 (H) 70 - 99 mg/dL  Glucose, capillary     Status: Abnormal   Collection Time: 06/14/21  2:48 AM  Result Value Ref Range   Glucose-Capillary 242 (H) 70 - 99 mg/dL  Protime-INR     Status: Abnormal   Collection Time: 06/14/21  3:30 AM  Result Value Ref Range   Prothrombin Time 17.8 (H) 11.4 - 15.2 seconds   INR 1.5 (H) 0.8 - 1.2  Basic metabolic panel     Status: Abnormal   Collection Time: 06/14/21  3:30 AM  Result Value Ref Range   Sodium 135 135 - 145 mmol/L   Potassium 4.6 3.5 - 5.1 mmol/L    Chloride 101 98 - 111 mmol/L   CO2 26 22 - 32 mmol/L   Glucose, Bld 237 (H) 70 - 99 mg/dL   BUN 66 (H) 8 - 23 mg/dL   Creatinine, Ser 2.87 (H) 0.61 - 1.24 mg/dL   Calcium 7.6 (L) 8.9 - 10.3 mg/dL   GFR, Estimated 24 (L) >60 mL/min   Anion gap 8 5 - 15  CBC with Differential/Platelet     Status: Abnormal   Collection Time: 06/14/21  3:30 AM  Result Value Ref Range   WBC 7.4 4.0 - 10.5 K/uL   RBC 2.70 (L) 4.22 - 5.81 MIL/uL   Hemoglobin 7.8 (L) 13.0 - 17.0 g/dL   HCT 24.9 (L) 39.0 - 52.0 %   MCV 92.2 80.0 - 100.0 fL   MCH 28.9 26.0 - 34.0 pg   MCHC 31.3 30.0 - 36.0 g/dL   RDW 15.8 (H) 11.5 - 15.5 %   Platelets 255 150 - 400 K/uL   nRBC 0.3 (H) 0.0 - 0.2 %   Neutrophils Relative % 64 %   Neutro Abs 4.8 1.7 - 7.7 K/uL   Lymphocytes Relative 20 %   Lymphs Abs 1.5 0.7 - 4.0 K/uL   Monocytes Relative 9 %   Monocytes Absolute 0.7 0.1 - 1.0 K/uL   Eosinophils Relative 5 %   Eosinophils Absolute 0.3 0.0 - 0.5 K/uL   Basophils Relative 1 %   Basophils Absolute 0.1 0.0 - 0.1 K/uL   Immature Granulocytes 1 %   Abs Immature Granulocytes 0.05 0.00 - 0.07 K/uL  Magnesium     Status: None   Collection Time: 06/14/21  3:30 AM  Result Value Ref Range   Magnesium 2.2 1.7 - 2.4 mg/dL  Glucose, capillary     Status: Abnormal   Collection Time: 06/14/21  7:25 AM  Result Value Ref Range   Glucose-Capillary 226 (H) 70 - 99 mg/dL  Prepare RBC (crossmatch)     Status: None   Collection Time: 06/14/21  8:11 AM  Result Value Ref Range   Order Confirmation      ORDER PROCESSED BY BLOOD BANK Performed at [facility], [address], [place],  [address]   Glucose, capillary     Status: Abnormal   Collection Time: 06/14/21 11:57 AM  Result Value Ref Range   Glucose-Capillary 195 (H) 70 - 99 mg/dL    Recent Results (from the  past 240 hour(s))  Resp Panel by RT-PCR (Flu A&B, Covid) Nasopharyngeal Swab     Status: None   Collection Time: 06/11/21  5:09 PM   Specimen:  Nasopharyngeal Swab; Nasopharyngeal(NP) swabs in vial transport medium  Result Value Ref Range Status   SARS Coronavirus 2 by RT PCR NEGATIVE NEGATIVE Final    Comment: (NOTE) SARS-CoV-2 target nucleic acids are NOT DETECTED.  The SARS-CoV-2 RNA is generally detectable in upper respiratory specimens during the acute phase of infection. The lowest concentration of SARS-CoV-2 viral copies this assay can detect is 138 copies/mL. A negative result does not preclude SARS-Cov-2 infection and should not be used as the sole basis for treatment or other patient management decisions. A negative result may occur with  improper specimen collection/handling, submission of specimen other than nasopharyngeal swab, presence of viral mutation(s) within the areas targeted by this assay, and inadequate number of viral copies(<138 copies/mL). A negative result must be combined with clinical observations, patient history, and epidemiological information. The expected result is Negative.  Fact Sheet for Patients:  EntrepreneurPulse.com.au  Fact Sheet for Healthcare Providers:  IncredibleEmployment.be  This test is no t yet approved or cleared by the [place] FDA and  has been authorized for detection and/or diagnosis of SARS-CoV-2 by FDA under an Emergency Use Authorization (EUA). This EUA will remain  in effect (meaning this test can be used) for the duration of the COVID-19 declaration under Section 564(b)(1) of the Act, 21 U.S.C.section 360bbb-3(b)(1), unless the authorization is terminated  or revoked sooner.       Influenza A by PCR NEGATIVE NEGATIVE Final   Influenza B by PCR NEGATIVE NEGATIVE Final    Comment: (NOTE) The Xpert Xpress SARS-CoV-2/FLU/RSV plus assay is intended as an aid in the diagnosis of influenza from Nasopharyngeal swab specimens and should not be used as a sole basis for treatment. Nasal washings and aspirates are unacceptable for  Xpert Xpress SARS-CoV-2/FLU/RSV testing.  Fact Sheet for Patients: EntrepreneurPulse.com.au  Fact Sheet for Healthcare Providers: IncredibleEmployment.be  This test is not yet approved or cleared by the [place] FDA and has been authorized for detection and/or diagnosis of SARS-CoV-2 by FDA under an Emergency Use Authorization (EUA). This EUA will remain in effect (meaning this test can be used) for the duration of the COVID-19 declaration under Section 564(b)(1) of the Act, 21 U.S.C. section 360bbb-3(b)(1), unless the authorization is terminated or revoked.  Performed at [facility], [place] [address] [place], [place] [address]      Radiology Studies: No results found. DG Knee Left Port  Final Result    DG Pelvis 1-2 Views  Final Result    DG Tibia/Fibula Left  Final Result    DG Knee 1-2 Views Left  Final Result    DG FEMUR MIN 2 VIEWS LEFT  Final Result    CT Knee Left Wo Contrast  Final Result    CT ABDOMEN PELVIS W CONTRAST  Final Result    DG Lumbar Spine Complete  Final Result      Scheduled Meds:  cephALEXin  250 mg Oral QHS   FLUoxetine  40 mg Oral QPM   insulin aspart  0-20 Units Subcutaneous TID WC   insulin glargine-yfgn  50 Units Subcutaneous QHS   methocarbamol  1,000 mg Oral BID   pantoprazole  40 mg Oral Daily   pioglitazone  45 mg Oral Daily   tamsulosin  0.4 mg Oral Daily   PRN Meds: acetaminophen **OR** acetaminophen,  HYDROmorphone (DILAUDID) injection, ondansetron **OR** ondansetron (ZOFRAN) IV, oxyCODONE, silver sulfADIAZINE Continuous Infusions:  sodium chloride 75 mL/hr at 06/14/21 0831      LOS: 2 days  Time spent: Greater than 50% of the 35 minute visit was spent in counseling/coordination of care for the patient as laid out in the A&P.   Dwyane Dee, MD [facility] 06/14/2021, 1:30 PM

## 2021-06-15 ENCOUNTER — Inpatient Hospital Stay (HOSPITAL_COMMUNITY): Payer: HMO

## 2021-06-15 ENCOUNTER — Inpatient Hospital Stay: Admission: RE | Admit: 2021-06-15 | Payer: PPO | Source: Ambulatory Visit

## 2021-06-15 DIAGNOSIS — N179 Acute kidney failure, unspecified: Secondary | ICD-10-CM | POA: Diagnosis not present

## 2021-06-15 DIAGNOSIS — S83105A Unspecified dislocation of left knee, initial encounter: Secondary | ICD-10-CM | POA: Diagnosis not present

## 2021-06-15 DIAGNOSIS — N1832 Chronic kidney disease, stage 3b: Secondary | ICD-10-CM | POA: Diagnosis not present

## 2021-06-15 DIAGNOSIS — M25462 Effusion, left knee: Secondary | ICD-10-CM | POA: Diagnosis not present

## 2021-06-15 LAB — GLUCOSE, CAPILLARY
Glucose-Capillary: 166 mg/dL — ABNORMAL HIGH (ref 70–99)
Glucose-Capillary: 160 mg/dL — ABNORMAL HIGH (ref 70–99)
Glucose-Capillary: 170 mg/dL — ABNORMAL HIGH (ref 70–99)
Glucose-Capillary: 160 mg/dL — ABNORMAL HIGH (ref 70–99)
Glucose-Capillary: 155 mg/dL — ABNORMAL HIGH (ref 70–99)

## 2021-06-15 LAB — CBC WITH DIFFERENTIAL/PLATELET
Abs Immature Granulocytes: 0.06 10*3/uL (ref 0.00–0.07)
Basophils Absolute: 0.1 10*3/uL (ref 0.0–0.1)
Basophils Relative: 1 %
Eosinophils Absolute: 0.4 10*3/uL (ref 0.0–0.5)
Eosinophils Relative: 5 %
HCT: 27.6 % — ABNORMAL LOW (ref 39.0–52.0)
Hemoglobin: 8.7 g/dL — ABNORMAL LOW (ref 13.0–17.0)
Immature Granulocytes: 1 %
Lymphocytes Relative: 22 %
Lymphs Abs: 1.7 10*3/uL (ref 0.7–4.0)
MCH: 29.1 pg (ref 26.0–34.0)
MCHC: 31.5 g/dL (ref 30.0–36.0)
MCV: 92.3 fL (ref 80.0–100.0)
Monocytes Absolute: 0.7 10*3/uL (ref 0.1–1.0)
Monocytes Relative: 9 %
Neutro Abs: 4.8 10*3/uL (ref 1.7–7.7)
Neutrophils Relative %: 62 %
Platelets: 283 10*3/uL (ref 150–400)
RBC: 2.99 MIL/uL — ABNORMAL LOW (ref 4.22–5.81)
RDW: 16 % — ABNORMAL HIGH (ref 11.5–15.5)
WBC: 7.7 10*3/uL (ref 4.0–10.5)
nRBC: 0 % (ref 0.0–0.2)

## 2021-06-15 LAB — TYPE AND SCREEN
ABO/RH(D): A POS
Antibody Screen: NEGATIVE
Unit division: 0
Unit division: 0

## 2021-06-15 LAB — BPAM RBC
Blood Product Expiration Date: 202210132359
Blood Product Expiration Date: 202210142359
ISSUE DATE / TIME: 202209170850
ISSUE DATE / TIME: 202209180838
Unit Type and Rh: 6200
Unit Type and Rh: 6200

## 2021-06-15 LAB — BASIC METABOLIC PANEL
Anion gap: 8 (ref 5–15)
BUN: 74 mg/dL — ABNORMAL HIGH (ref 8–23)
CO2: 26 mmol/L (ref 22–32)
Calcium: 8.2 mg/dL — ABNORMAL LOW (ref 8.9–10.3)
Chloride: 107 mmol/L (ref 98–111)
Creatinine, Ser: 2.57 mg/dL — ABNORMAL HIGH (ref 0.61–1.24)
GFR, Estimated: 27 mL/min — ABNORMAL LOW (ref >60)
Glucose, Bld: 177 mg/dL — ABNORMAL HIGH (ref 70–99)
Potassium: 4.8 mmol/L (ref 3.5–5.1)
Sodium: 141 mmol/L (ref 135–145)

## 2021-06-15 LAB — PROTIME-INR
INR: 1.3 — ABNORMAL HIGH (ref 0.8–1.2)
Prothrombin Time: 15.9 seconds — ABNORMAL HIGH (ref 11.4–15.2)

## 2021-06-15 LAB — MAGNESIUM: Magnesium: 2.3 mg/dL (ref 1.7–2.4)

## 2021-06-15 MED ORDER — PROSOURCE PLUS PO LIQD
30.0000 mL | Freq: Two times a day (BID) | ORAL | Status: DC
  Administered 2021-06-15 – 2021-07-27 (×72): 30 mL via ORAL
  Filled 2021-06-15 (×76): qty 30

## 2021-06-15 MED ORDER — ADULT MULTIVITAMIN W/MINERALS CH
1.0000 | ORAL_TABLET | Freq: Every day | ORAL | Status: DC
  Administered 2021-06-16 – 2021-07-29 (×44): 1 via ORAL
  Filled 2021-06-15 (×43): qty 1

## 2021-06-15 MED ORDER — SODIUM CHLORIDE 0.9 % IV SOLN: INTRAVENOUS | Status: DC

## 2021-06-15 MED ORDER — GLUCERNA SHAKE PO LIQD
237.0000 mL | Freq: Two times a day (BID) | ORAL | Status: DC
  Administered 2021-06-15 – 2021-06-22 (×7): 237 mL via ORAL
  Filled 2021-06-15 (×15): qty 237

## 2021-06-15 NOTE — Care Management Important Message (Signed)
Important Message  Patient Details IM Letter given to the Patient. Name: Joseph [name]. MRN: [id number] Date of Birth: Mar 01, 1956   Medicare Important Message Given:  Yes     Kerin Salen 06/15/2021, 12:50 PM

## 2021-06-15 NOTE — Progress Notes (Signed)
Progress Note    Joseph [name].   PXT:[id number]  DOB: [birth year]  DOA: 06/11/2021     3  PCP: Donald Prose, MD  Initial CC: falls at home, left knee pain and swelling/bruising  Hospital Course: Joseph [name]. is a 65 y.o. male with PMH morbid obesity, anxiety/depression, hx left knee DVT, PE (on chronic Coumadin), GERD, HTN, IDA, nonspecific myalgias/myositis, OSA on CPAP, PVD, polyneuropathy, DMII, HLD, multiple falls who presented to the ER after another fall.   He was initially seen in the ER on 06/08/2021 after a fall at home.  He had fallen getting out of the shower.  He underwent imaging of CT C-spine, CT head, hip/pelvis x-rays, and left knee x-ray.  There were no acute findings.  He has underlying cervical spinal stenosis but no other acute findings were seen on imaging and he was discharged home. Per his daughter, he essentially was resting in bed from ER discharge until 9/15 when he had attempted to get out of bed and again had another fall.  Appears that his left knee may have been dislocated from this fall and was replaced back in socket by EMS.  In the ER he then had another fall out of bed with left knee dislocation.  Ortho was paged and performed reduction under conscious sedation.  He again underwent multiple imaging studies in the ER on 9/15. No visible acute fractures but body habitus does limit some of the studies. His CT A/P was motion degraded and was intended to evaluate for RP bleed. Of note, BP remained stable/normal and Hgb noted to be lower than 9/12 level but imaging studies do show a left knee effusion which is a presumed large hematoma due to his supratherapeutic INR on admission which was reversed some with Vit K.   He was admitted for further pain control and monitoring of left leg for any development of compartment syndrome and further evaluation as needed.   Interval History:  No events overnight.  Reviewed labs with him this morning. Still endorses  that pain is improving daily.  Wants to try and sit on edge of bed today if possible. Nursing will help coordinate.   ROS: Constitutional: negative for chills and fevers, Respiratory: negative for cough and sputum, Cardiovascular: negative for chest pain, and Gastrointestinal: negative for abdominal pain  Assessment & Plan: * Left knee dislocation, initial encounter - s/p fall at home x 2 this week and again in ER on 9/15 - xray shows fracture fragments as well (presumed from proximal tibia per xray read) - knee immobilizer in place and to remain per ortho - continue bed rest  - see knee effusion also  - eventually needs MRI knee once more stable  Effusion of left knee - concerned for large hematoma given amount of bruising and swelling with INR 6.2 on admission (treated with total of 5 mg Vit K) -INR downtrending and responded well to vitamin K - INR now < 2 - at risk for worsening but overall has been improving since admission; pain improving, DP pulse intact - continue neurovascular checks of LLE -No surgical intervention at this time per Ortho  Normocytic anemia - likely mixed from ACD and hematoma - baseline Hgb around 12 g/dL - admitted with Hgb 9.9 g/dL with large LLE bruising and presumed hematoma/effusion - now is s/p 2 units PRBC total for admission and 1 unit FFP - Hgb 8.7 g/dL today; bruising on knee improving - CBC daily   Acute  renal failure superimposed on stage 3b chronic kidney disease ([place]) - patient has history of CKD3a. Baseline creat ~ 1.4 -1.5, eGFR 51 -56 - monitor BMP -Creatinine still worsening; may have developed an ATN from hypoperfusion during blood loss - too difficult to obtain renal u/s at this time - for now continue IVF and trend urine output and renal function - I have communicated renal function with patient and his daughter bedside - continue holding BP meds - Repeat BMP in a.m.   Hx pulmonary embolism - on chronic coumadin at home,  currently on hold in setting of supratherapeutic INR and L knee effusion  Coronary atherosclerosis - no CP - holding statin and Coumadin   Class 3 obesity - Complicates overall prognosis and care - Body mass index is 60.69 kg/m.  Diabetic neuropathy ([place]) - continue current pain regimen   Diabetes mellitus type 2, uncontrolled ([place]) - continue SSI and CBG monitoring  - continue Lantus and metformin   Esophageal reflux - continue PPI  OSA (obstructive sleep apnea) - continue qhs CPAP  Essential hypertension, benign - continue current regimen   Pure hypercholesterolemia - hold statin for now    Old records reviewed in assessment of this patient  Antimicrobials:   DVT prophylaxis: SCDs Start: 06/11/21 2153   Code Status:   Code Status: Full Code Family Communication: Daughter  Disposition Plan: Status is: Inpatient  Remains inpatient appropriate because:Ongoing active pain requiring inpatient pain management, Ongoing diagnostic testing needed not appropriate for outpatient work up, IV treatments appropriate due to intensity of illness or inability to take PO, and Inpatient level of care appropriate due to severity of illness  Dispo: The patient is from: Home              Anticipated d/c is to:  pending clinical course               Patient currently is not medically stable to d/c.   Difficult to place patient No   Risk of unplanned readmission score: Unplanned Admission- Pilot do not use: 22.24   Objective: Blood pressure (!) 151/41, pulse 79, temperature 99.4 F (37.4 C), temperature source Oral, resp. rate 20, height 6' 1"  (1.854 m), weight (!) 208.7 kg, SpO2 90 %.  Examination: General appearance: alert, cooperative, no distress, and more comfortable appearing today but still has pain with palpation in left leg  but improving Head: Normocephalic, without obvious abnormality, atraumatic Eyes:  EOMI Lungs:  clear sounds from anterior lung fields but  distant Heart: regular rate and rhythm, S1, S2 normal, and distant heart sounds Abdomen:  obese, soft, unable to appreciate bowel sounds due to body habitus  Extremities:  LLE obese and very edematous with severe bruising/swelling and excoriated skin on anterior surface; LLE is now less tender with palpation (able to palpate medial and lateral distal thigh which could not be done on admission); DP pulse intact in LLE Skin:  excoriated skin on LLE anterior surface; both legs have chronic stasis changes with indurated/flaking skin and chronic ulcers Neurologic: unable to evaluate LLE due to pain but DP pulse intact; no other focal deficits appreciated on remainder of performed limited neuro exam  Pic taken 9/19:     Consultants:  Ortho surgery  Procedures:  Left knee reduction, 06/11/21  Data Reviewed: I have personally reviewed following labs and imaging studies Results for orders placed or performed during the hospital encounter of 06/11/21 (from the past 24 hour(s))  Glucose, capillary  Status: Abnormal   Collection Time: 06/14/21  5:10 PM  Result Value Ref Range   Glucose-Capillary 187 (H) 70 - 99 mg/dL  Glucose, capillary     Status: Abnormal   Collection Time: 06/14/21  8:45 PM  Result Value Ref Range   Glucose-Capillary 207 (H) 70 - 99 mg/dL  Protime-INR     Status: Abnormal   Collection Time: 06/15/21  2:56 AM  Result Value Ref Range   Prothrombin Time 15.9 (H) 11.4 - 15.2 seconds   INR 1.3 (H) 0.8 - 1.2  Basic metabolic panel     Status: Abnormal   Collection Time: 06/15/21  2:56 AM  Result Value Ref Range   Sodium 141 135 - 145 mmol/L   Potassium 4.8 3.5 - 5.1 mmol/L   Chloride 107 98 - 111 mmol/L   CO2 26 22 - 32 mmol/L   Glucose, Bld 177 (H) 70 - 99 mg/dL   BUN 74 (H) 8 - 23 mg/dL   Creatinine, Ser 2.57 (H) 0.61 - 1.24 mg/dL   Calcium 8.2 (L) 8.9 - 10.3 mg/dL   GFR, Estimated 27 (L) >60 mL/min   Anion gap 8 5 - 15  CBC with Differential/Platelet     Status:  Abnormal   Collection Time: 06/15/21  2:56 AM  Result Value Ref Range   WBC 7.7 4.0 - 10.5 K/uL   RBC 2.99 (L) 4.22 - 5.81 MIL/uL   Hemoglobin 8.7 (L) 13.0 - 17.0 g/dL   HCT 27.6 (L) 39.0 - 52.0 %   MCV 92.3 80.0 - 100.0 fL   MCH 29.1 26.0 - 34.0 pg   MCHC 31.5 30.0 - 36.0 g/dL   RDW 16.0 (H) 11.5 - 15.5 %   Platelets 283 150 - 400 K/uL   nRBC 0.0 0.0 - 0.2 %   Neutrophils Relative % 62 %   Neutro Abs 4.8 1.7 - 7.7 K/uL   Lymphocytes Relative 22 %   Lymphs Abs 1.7 0.7 - 4.0 K/uL   Monocytes Relative 9 %   Monocytes Absolute 0.7 0.1 - 1.0 K/uL   Eosinophils Relative 5 %   Eosinophils Absolute 0.4 0.0 - 0.5 K/uL   Basophils Relative 1 %   Basophils Absolute 0.1 0.0 - 0.1 K/uL   Immature Granulocytes 1 %   Abs Immature Granulocytes 0.06 0.00 - 0.07 K/uL  Magnesium     Status: None   Collection Time: 06/15/21  2:56 AM  Result Value Ref Range   Magnesium 2.3 1.7 - 2.4 mg/dL  Glucose, capillary     Status: Abnormal   Collection Time: 06/15/21  3:07 AM  Result Value Ref Range   Glucose-Capillary 166 (H) 70 - 99 mg/dL  Glucose, capillary     Status: Abnormal   Collection Time: 06/15/21  7:30 AM  Result Value Ref Range   Glucose-Capillary 160 (H) 70 - 99 mg/dL  Glucose, capillary     Status: Abnormal   Collection Time: 06/15/21 11:09 AM  Result Value Ref Range   Glucose-Capillary 170 (H) 70 - 99 mg/dL    Recent Results (from the past 240 hour(s))  Resp Panel by RT-PCR (Flu A&B, Covid) Nasopharyngeal Swab     Status: None   Collection Time: 06/11/21  5:09 PM   Specimen: Nasopharyngeal Swab; Nasopharyngeal(NP) swabs in vial transport medium  Result Value Ref Range Status   SARS Coronavirus 2 by RT PCR NEGATIVE NEGATIVE Final    Comment: (NOTE) SARS-CoV-2 target nucleic acids are NOT DETECTED.  The  SARS-CoV-2 RNA is generally detectable in upper respiratory specimens during the acute phase of infection. The lowest concentration of SARS-CoV-2 viral copies this assay can detect  is 138 copies/mL. A negative result does not preclude SARS-Cov-2 infection and should not be used as the sole basis for treatment or other patient management decisions. A negative result may occur with  improper specimen collection/handling, submission of specimen other than nasopharyngeal swab, presence of viral mutation(s) within the areas targeted by this assay, and inadequate number of viral copies(<138 copies/mL). A negative result must be combined with clinical observations, patient history, and epidemiological information. The expected result is Negative.  Fact Sheet for Patients:  EntrepreneurPulse.com.au  Fact Sheet for Healthcare Providers:  IncredibleEmployment.be  This test is no t yet approved or cleared by the [place] FDA and  has been authorized for detection and/or diagnosis of SARS-CoV-2 by FDA under an Emergency Use Authorization (EUA). This EUA will remain  in effect (meaning this test can be used) for the duration of the COVID-19 declaration under Section 564(b)(1) of the Act, 21 U.S.C.section 360bbb-3(b)(1), unless the authorization is terminated  or revoked sooner.       Influenza A by PCR NEGATIVE NEGATIVE Final   Influenza B by PCR NEGATIVE NEGATIVE Final    Comment: (NOTE) The Xpert Xpress SARS-CoV-2/FLU/RSV plus assay is intended as an aid in the diagnosis of influenza from Nasopharyngeal swab specimens and should not be used as a sole basis for treatment. Nasal washings and aspirates are unacceptable for Xpert Xpress SARS-CoV-2/FLU/RSV testing.  Fact Sheet for Patients: EntrepreneurPulse.com.au  Fact Sheet for Healthcare Providers: IncredibleEmployment.be  This test is not yet approved or cleared by the [place] FDA and has been authorized for detection and/or diagnosis of SARS-CoV-2 by FDA under an Emergency Use Authorization (EUA). This EUA will remain in effect  (meaning this test can be used) for the duration of the COVID-19 declaration under Section 564(b)(1) of the Act, 21 U.S.C. section 360bbb-3(b)(1), unless the authorization is terminated or revoked.  Performed at [facility], [place] [address] [place], [place] [address]      Radiology Studies: No results found. DG Knee Left Port  Final Result    DG Pelvis 1-2 Views  Final Result    DG Tibia/Fibula Left  Final Result    DG Knee 1-2 Views Left  Final Result    DG FEMUR MIN 2 VIEWS LEFT  Final Result    CT Knee Left Wo Contrast  Final Result    CT ABDOMEN PELVIS W CONTRAST  Final Result    DG Lumbar Spine Complete  Final Result      Scheduled Meds:  cephALEXin  250 mg Oral QHS   FLUoxetine  40 mg Oral QPM   insulin aspart  0-20 Units Subcutaneous TID WC   insulin glargine-yfgn  50 Units Subcutaneous QHS   methocarbamol  1,000 mg Oral BID   pantoprazole  40 mg Oral Daily   pioglitazone  45 mg Oral Daily   tamsulosin  0.4 mg Oral Daily   PRN Meds: acetaminophen **OR** acetaminophen, HYDROmorphone (DILAUDID) injection, ondansetron **OR** ondansetron (ZOFRAN) IV, oxyCODONE, silver sulfADIAZINE Continuous Infusions:  sodium chloride        LOS: 3 days  Time spent: Greater than 50% of the 35 minute visit was spent in counseling/coordination of care for the patient as laid out in the A&P.   Dwyane Dee, MD [facility] 06/15/2021, 1:45 PM

## 2021-06-15 NOTE — Progress Notes (Signed)
Pt has home CPAP at bedside and is already on machine upon my arrival. Asks for something for bridge of nose as it is red and painful. RT placed mepilex dressing across nose.

## 2021-06-15 NOTE — Consult Note (Signed)
WOC Nurse Consult Note: Patient receiving care in [place]. Reason for Consult: LLE wound Wound type: See Orthopedic Service note from 06/12/21.  Silvadene was ordered for the LLE.   Pressure Injury POA: Yes/No/NA Measurement: Wound bed: Drainage (amount, consistency, odor)  Periwound: Dressing procedure/placement/frequency: WOC did not see and will not follow. Please follow Orthopedic orders for the LLE.  Val Riles, RN, MSN, CWOCN, CNS-BC, pager [phone]

## 2021-06-15 NOTE — Progress Notes (Signed)
Initial Nutrition Assessment  DOCUMENTATION CODES:   Morbid obesity  INTERVENTION:  - will order Glucerna Shake BID, each supplement provides 220 kcal and 10 grams of protein. - will order 30 ml Prosource Plus BID, each supplement provides 100 kcal and 15 grams protein.  - will order 1 tablet multivitamin with minerals/day.    NUTRITION DIAGNOSIS:   Inadequate oral intake related to decreased appetite as evidenced by per patient/family report.  GOAL:   Patient will meet greater than or equal to 90% of their needs  MONITOR:   PO intake, Supplement acceptance, Labs, Weight trends  REASON FOR ASSESSMENT:   Consult Assessment of nutrition requirement/status  ASSESSMENT:   65 y.o. male with medical history of morbid obesity, anxiety, depression, hx L knee DVT, PE (on chronic Coumadin), GERD, HTN, IDA, cervical spine stenosis, non-specific myalgias/myositis, OSA on CPAP, PVD, polyneuropathy, type 2 DM, HLD, and multiple falls. He presented to the ED due to a fall on 9/15. He was admitted for pain control and to monitor L leg for development of compartment syndrome.  Patient ate 100% of lunch and 100% of dinner on 9/17 (total of 756 kcal and 41 grams protein). No other meal completion percentages documented this admission.   Patient and family reported decreased appetite and intakes compared to baseline and interest in oral nutrition supplements to aid in meeting nutrition needs.   Weight on 9/15 was documented as 460 lb, which appears to be a stated weight. PTA the most recently documented weight was 469 lb on 04/03/20.   Per notes: - L knee dislocation--pending MRI once more stable - concern for L knee hematoma - ARF on stage 3 CKD - diabetic neuropathy   Labs reviewed; CBGs: 166, 160, 170 mg/dl, BUN: 74 mg/dl, creatinine: 2.57 mg/dl, Ca: 8.2 mg/dl, GFR: 27 ml/min. Medications reviewed; sliding scale novolog, 50 units semglee, 40 mg oral protonix/day. IVF; NS @ 75 ml/hr.      NUTRITION - FOCUSED PHYSICAL EXAM:  No muscle or fat depletions, deep pitting edema to LLE.   Diet Order:   Diet Order             Diet heart healthy/carb modified Room service appropriate? Yes; Fluid consistency: Thin  Diet effective now                   EDUCATION NEEDS:   No education needs have been identified at this time  Skin:  Skin Assessment: Reviewed RN Assessment  Last BM:  9/16  Height:   Ht Readings from Last 1 Encounters:  06/11/21 6\' 1"  (1.854 m)    Weight:   Wt Readings from Last 1 Encounters:  06/11/21 (!) 208.7 kg     Estimated Nutritional Needs:  Kcal:  2300-2500 kcal Protein:  115-135 grams Fluid:  >/= 2.4 L/day     Jarome Matin, MS, RD, LDN, CNSC Inpatient Clinical Dietitian RD pager # available in [facility]  After hours/weekend pager # available in [facility]

## 2021-06-15 NOTE — Plan of Care (Signed)
  Problem: Education: Goal: Knowledge of General Education information will improve Description Including pain rating scale, medication(s)/side effects and non-pharmacologic comfort measures Outcome: Progressing   

## 2021-06-15 NOTE — Progress Notes (Signed)
0800- Pt lying in bed with CPAP in place. Pt is alert and oriented x4. Lung sounds diminished bilaterally. Deep breathing exercises reviewed with pt. LLE immobilizer in place with gauze covering wounds. No complaints voiced at this time.   4481- Bed linens saturated with urine. Pt medicated with Dilaudid 1mg  ivp and turned and given partial bath by this nurse, Marissa, RN, and Angel, NT. LLE skin cleaned, Silvadene cream applied  and covered with gauze and ABD pads.   1030- ABD pads checked for drainage saturation.  1250- ABD pads replaced underneath LLE immobilizer. Pt refuses repositioning.  1645- Pt sleeping. No distress noted. CPAP in place.

## 2021-06-16 DIAGNOSIS — N179 Acute kidney failure, unspecified: Secondary | ICD-10-CM | POA: Diagnosis not present

## 2021-06-16 DIAGNOSIS — M25462 Effusion, left knee: Secondary | ICD-10-CM | POA: Diagnosis not present

## 2021-06-16 LAB — CBC WITH DIFFERENTIAL/PLATELET
Abs Immature Granulocytes: 0.07 10*3/uL (ref 0.00–0.07)
Basophils Absolute: 0.1 10*3/uL (ref 0.0–0.1)
Basophils Relative: 1 %
Eosinophils Absolute: 0.4 10*3/uL (ref 0.0–0.5)
Eosinophils Relative: 6 %
HCT: 27.1 % — ABNORMAL LOW (ref 39.0–52.0)
Hemoglobin: 8.5 g/dL — ABNORMAL LOW (ref 13.0–17.0)
Immature Granulocytes: 1 %
Lymphocytes Relative: 20 %
Lymphs Abs: 1.5 10*3/uL (ref 0.7–4.0)
MCH: 29.1 pg (ref 26.0–34.0)
MCHC: 31.4 g/dL (ref 30.0–36.0)
MCV: 92.8 fL (ref 80.0–100.0)
Monocytes Absolute: 0.7 10*3/uL (ref 0.1–1.0)
Monocytes Relative: 10 %
Neutro Abs: 4.5 10*3/uL (ref 1.7–7.7)
Neutrophils Relative %: 62 %
Platelets: 318 10*3/uL (ref 150–400)
RBC: 2.92 MIL/uL — ABNORMAL LOW (ref 4.22–5.81)
RDW: 15.9 % — ABNORMAL HIGH (ref 11.5–15.5)
WBC: 7.3 10*3/uL (ref 4.0–10.5)
nRBC: 0 % (ref 0.0–0.2)

## 2021-06-16 LAB — GLUCOSE, CAPILLARY
Glucose-Capillary: 121 mg/dL — ABNORMAL HIGH (ref 70–99)
Glucose-Capillary: 138 mg/dL — ABNORMAL HIGH (ref 70–99)
Glucose-Capillary: 111 mg/dL — ABNORMAL HIGH (ref 70–99)
Glucose-Capillary: 169 mg/dL — ABNORMAL HIGH (ref 70–99)

## 2021-06-16 LAB — BASIC METABOLIC PANEL
Anion gap: 8 (ref 5–15)
BUN: 70 mg/dL — ABNORMAL HIGH (ref 8–23)
CO2: 25 mmol/L (ref 22–32)
Calcium: 7.9 mg/dL — ABNORMAL LOW (ref 8.9–10.3)
Chloride: 105 mmol/L (ref 98–111)
Creatinine, Ser: 1.93 mg/dL — ABNORMAL HIGH (ref 0.61–1.24)
GFR, Estimated: 38 mL/min — ABNORMAL LOW (ref >60)
Glucose, Bld: 143 mg/dL — ABNORMAL HIGH (ref 70–99)
Potassium: 4.7 mmol/L (ref 3.5–5.1)
Sodium: 138 mmol/L (ref 135–145)

## 2021-06-16 LAB — MAGNESIUM: Magnesium: 2.4 mg/dL (ref 1.7–2.4)

## 2021-06-16 LAB — PROTIME-INR
INR: 1.3 — ABNORMAL HIGH (ref 0.8–1.2)
Prothrombin Time: 16.6 seconds — ABNORMAL HIGH (ref 11.4–15.2)

## 2021-06-16 NOTE — Progress Notes (Signed)
PT Cancellation Note  Patient Details Name: Joseph [name]. MRN: [id number] DOB: 08-09-1956   Cancelled Treatment:    Reason Eval/Treat Not Completed:  Order received. Chart reviewed. Will await further instructions/recommendations from orthopedics before proceeding with PT eval. Thanks.    [facility]  Office: [phone] Pager: [phone]

## 2021-06-16 NOTE — Progress Notes (Signed)
PROGRESS NOTE   Joseph [name].  ZOX:[id number]    DOB: Aug 12, 1956    DOA: 06/11/2021  PCP: Donald Prose, MD   I have briefly reviewed patients previous medical records in [facility].  Chief Complaint  Patient presents with   Leg Swelling   Leg Pain    left    Brief Narrative:   Joseph Spackman. is a 65 y.o. male with PMH morbid obesity, anxiety/depression, hx left knee DVT, PE (on chronic Coumadin), GERD, HTN, IDA, nonspecific myalgias/myositis, OSA on CPAP, PVD, polyneuropathy, DMII, HLD, multiple falls who presented to the ER after another fall.    He was initially seen in the ER on 06/08/2021 after a fall at home.  He had fallen getting out of the shower.  He underwent imaging of CT C-spine, CT head, hip/pelvis x-rays, and left knee x-ray.  There were no acute findings.  He has underlying cervical spinal stenosis but no other acute findings were seen on imaging and he was discharged home. Per his daughter, he essentially was resting in bed from ER discharge until 9/15 when he had attempted to get out of bed and again had another fall.  Appears that his left knee may have been dislocated from this fall and was replaced back in socket by EMS.  In the ER he then had another fall out of bed with left knee dislocation.  Ortho was paged and performed reduction under conscious sedation.   He again underwent multiple imaging studies in the ER on 9/15. No visible acute fractures but body habitus does limit some of the studies. His CT A/P was motion degraded and was intended to evaluate for RP bleed. Of note, BP remained stable/normal and Hgb noted to be lower than 9/12 level but imaging studies do show a left knee effusion which is a presumed large hematoma due to his supratherapeutic INR on admission which was reversed some with Vit K.    He was admitted for further pain control and monitoring of left leg for any development of compartment syndrome and further evaluation as needed.     Assessment & Plan:  Principal Problem:   Left knee dislocation, initial encounter Active Problems:   Pure hypercholesterolemia   Essential hypertension, benign   OSA (obstructive sleep apnea)   Hx pulmonary embolism   Esophageal reflux   Diabetes mellitus type 2, uncontrolled (HCC)   Diabetic neuropathy (HCC)   Acute renal failure superimposed on stage 3b chronic kidney disease (HCC)   Normocytic anemia   Class 3 obesity   Aortic atherosclerosis (HCC)   Coronary atherosclerosis   Effusion of left knee   Left knee dislocation   Left knee dislocation, initial encounter - s/p fall at home x 2 this week and again in ER on 9/15 - xray shows fracture fragments as well (presumed from proximal tibia per xray read) -As per extensive communication with Dr. Erlinda Hong, orthopedics on 9/20: No weightbearing to LLE, no ROM, knee immobilizer at all times.  He indicated that the knee is very unstable.  I have requested Dr.Xu to reassess patient at bedside because patient complains that his knee needs to be straightened and I am hesitant to take down his knee immobilizer or examine.  PT evaluation on hold until orthopedics reassesses. - eventually needs MRI knee once more stable   Effusion of left knee - concerned for large hematoma given amount of bruising and swelling with INR 6.2 on admission (treated with total of 5  mg Vit K) -INR downtrending and responded well to vitamin K - INR now < 2 - at risk for worsening but overall has been improving since admission; pain improving, DP pulse intact - continue neurovascular checks of LLE -No surgical intervention at this time per Ortho   Normocytic anemia - likely mixed from ACD and hematoma - baseline Hgb around 12 g/dL - admitted with Hgb 9.9 g/dL with large LLE bruising and presumed hematoma/effusion - now is s/p 2 units PRBC total for admission and 1 unit FFP - Hgb stable in the mid 8 g range; bruising on knee improving - CBC daily    Acute  renal failure superimposed on stage 3b chronic kidney disease ([place]) - patient has history of CKD3a. Baseline creat ~ 1.4 -1.5, eGFR 51 -56 - may have developed an ATN from hypoperfusion during blood loss - too difficult to obtain renal u/s at this time - for now continue gentle IVF x24 hours and if downward creatinine trend continues then will stop.  And trend urine output and renal function.  Nonoliguric/1650 mL urine documented yesterday -Creatinine starting to downtrend over the last 3 days (2.87 > 2.57 > 1.93) - continue holding BP meds - Repeat BMP in a.m.    Hx pulmonary embolism - on chronic coumadin at home, currently on hold in setting of supratherapeutic INR and L knee effusion/hematoma   Coronary atherosclerosis - no CP - holding statin and Coumadin    Class 3 obesity - Complicates overall prognosis and care - Body mass index is 60.69 kg/m.   Diabetic neuropathy ([place]) - continue current pain regimen    Diabetes mellitus type 2, uncontrolled ([place]) - continue SSI and CBG monitoring.  Reasonable control. - continue Lantus and metformin    Esophageal reflux - continue PPI   OSA (obstructive sleep apnea) - continue qhs CPAP   Essential hypertension, benign - continue current regimen    Pure hypercholesterolemia - hold statin for now     Body mass index is 60.69 kg/m.  Nutritional Status Nutrition Problem: Inadequate oral intake Etiology: decreased appetite Signs/Symptoms: per patient/family report Interventions: Glucerna shake, MVI, Prostat  Pressure Ulcer:     DVT prophylaxis: SCDs Start: 06/11/21 2153     Code Status: Full Code Family Communication: None at bedside. Disposition:  Status is: Inpatient  Remains inpatient appropriate because:Inpatient level of care appropriate due to severity of illness  Dispo: The patient is from:  Home              Anticipated d/c is to:  TBD              Patient currently is not medically stable to d/c.    Difficult to place patient Yes.  May be DTP due to morbid obesity, nonweightbearing on left lower extremity etc.        Consultants:   Orthopedics  Procedures:   Left knee reduction, 06/11/21  Antimicrobials:    Anti-infectives (From admission, onward)    Start     Dose/Rate Route Frequency Ordered Stop   06/11/21 2230  cephALEXin (KEFLEX) capsule 250 mg        250 mg Oral Daily at bedtime 06/11/21 2221           Subjective:  Reports that he uses CPAP at night and whenever he is laying down even if not asleep.  Some headache this morning.  Not much pain in his left knee but wants me to reposition his knee-I advised him that  it might not be the best idea and would have orthopedics evaluate.  Objective:   Vitals:   06/15/21 1114 06/15/21 1939 06/16/21 0431 06/16/21 1438  BP: (!) 151/41 (!) 177/54 113/60 (!) 157/50  Pulse: 79 76 81 81  Resp: 20 18 17 16   Temp: 99.4 F (37.4 C) 100 F (37.8 C) 98.8 F (37.1 C) 98.6 F (37 C)  TempSrc: Oral Oral Oral Oral  SpO2: 90%  (!) 89% (!) 89%  Weight:      Height:        General exam: Middle-age male, moderately built and morbidly obese lying comfortably supine in bed without distress. Respiratory system: Clear to auscultation. Respiratory effort normal. Cardiovascular system: S1 & S2 heard, RRR. No JVD, murmurs, rubs, gallops or clicks. No pedal edema.  Telemetry personally reviewed: Sinus rhythm. Gastrointestinal system: Abdomen is nondistended, soft and nontender. No organomegaly or masses felt. Normal bowel sounds heard. Central nervous system: Alert and oriented. No focal neurological deficits. Extremities: Symmetric 5 x 5 power.  Left knee in immobilizer.  Did not attempt to remove it to examine.  Right lower leg with chronic skin changes/hyperpigmentation of unclear etiology. Skin: No rashes, lesions or ulcers Psychiatry: Judgement and insight appear normal. Mood & affect appropriate.   Pictures below from  06/11/2021.           Data Reviewed:   I have personally reviewed following labs and imaging studies   CBC: Recent Labs  Lab 06/14/21 0330 06/15/21 0256 06/16/21 0339  WBC 7.4 7.7 7.3  NEUTROABS 4.8 4.8 4.5  HGB 7.8* 8.7* 8.5*  HCT 24.9* 27.6* 27.1*  MCV 92.2 92.3 92.8  PLT 255 283 329    Basic Metabolic Panel: Recent Labs  Lab 06/14/21 0330 06/15/21 0256 06/16/21 0339  NA 135 141 138  K 4.6 4.8 4.7  CL 101 107 105  CO2 26 26 25   GLUCOSE 237* 177* 143*  BUN 66* 74* 70*  CREATININE 2.87* 2.57* 1.93*  CALCIUM 7.6* 8.2* 7.9*  MG 2.2 2.3 2.4    Liver Function Tests: Recent Labs  Lab 06/11/21 1700  AST 29  ALT 18  ALKPHOS 68  BILITOT 0.5  PROT 6.0*  ALBUMIN 2.8*    CBG: Recent Labs  Lab 06/15/21 1943 06/16/21 0722 06/16/21 1116  GLUCAP 155* 121* 138*    Microbiology Studies:   Recent Results (from the past 240 hour(s))  Resp Panel by RT-PCR (Flu A&B, Covid) Nasopharyngeal Swab     Status: None   Collection Time: 06/11/21  5:09 PM   Specimen: Nasopharyngeal Swab; Nasopharyngeal(NP) swabs in vial transport medium  Result Value Ref Range Status   SARS Coronavirus 2 by RT PCR NEGATIVE NEGATIVE Final    Comment: (NOTE) SARS-CoV-2 target nucleic acids are NOT DETECTED.  The SARS-CoV-2 RNA is generally detectable in upper respiratory specimens during the acute phase of infection. The lowest concentration of SARS-CoV-2 viral copies this assay can detect is 138 copies/mL. A negative result does not preclude SARS-Cov-2 infection and should not be used as the sole basis for treatment or other patient management decisions. A negative result may occur with  improper specimen collection/handling, submission of specimen other than nasopharyngeal swab, presence of viral mutation(s) within the areas targeted by this assay, and inadequate number of viral copies(<138 copies/mL). A negative result must be combined with clinical observations, patient  history, and epidemiological information. The expected result is Negative.  Fact Sheet for Patients:  EntrepreneurPulse.com.au  Fact Sheet for Healthcare Providers:  IncredibleEmployment.be  This test is no t yet approved or cleared by the [place] and  has been authorized for detection and/or diagnosis of SARS-CoV-2 by FDA under an Emergency Use Authorization (EUA). This EUA will remain  in effect (meaning this test can be used) for the duration of the COVID-19 declaration under Section 564(b)(1) of the Act, 21 U.S.C.section 360bbb-3(b)(1), unless the authorization is terminated  or revoked sooner.       Influenza A by PCR NEGATIVE NEGATIVE Final   Influenza B by PCR NEGATIVE NEGATIVE Final    Comment: (NOTE) The Xpert Xpress SARS-CoV-2/FLU/RSV plus assay is intended as an aid in the diagnosis of influenza from Nasopharyngeal swab specimens and should not be used as a sole basis for treatment. Nasal washings and aspirates are unacceptable for Xpert Xpress SARS-CoV-2/FLU/RSV testing.  Fact Sheet for Patients: EntrepreneurPulse.com.au  Fact Sheet for Healthcare Providers: IncredibleEmployment.be  This test is not yet approved or cleared by the [place] FDA and has been authorized for detection and/or diagnosis of SARS-CoV-2 by FDA under an Emergency Use Authorization (EUA). This EUA will remain in effect (meaning this test can be used) for the duration of the COVID-19 declaration under Section 564(b)(1) of the Act, 21 U.S.C. section 360bbb-3(b)(1), unless the authorization is terminated or revoked.  Performed at [facility], [place] [address] [place], [place] [address]      Radiology Studies:  DG Knee Left Port  Result Date: 06/15/2021 CLINICAL DATA:  Closed dislocation. Left knee injury from fall on 06/11/2021. Swelling and bruising. EXAM: PORTABLE LEFT KNEE - 1-2  VIEW COMPARISON:  Knee radiograph and CT 06/11/2021 FINDINGS: Previous tibiofemoral dislocation is reduced, the tibia is minimally anterior subluxed with respect to the femur. The patella appears normally located on these portable views. Two ossified intra-articular bodies measuring 2.2 and 1.8 cm, some of the bony fragmentation adjacent to the tibial spine on CT is not well demonstrated by radiograph. Generalized subcutaneous edema. Limited assessment for knee joint effusion on provided views. IMPRESSION: 1. Reduction of previous tibiofemoral dislocation with minimal anterior subluxation of the tibia with respect to the femur. Patella appears normally located on provided views. 2. Two ossified intra-articular bodies presumably fracture fragments measuring 2.2 and 1.8 cm. Electronically Signed   By: Keith Rake M.D.   On: 06/15/2021 17:32     Scheduled Meds:    (feeding supplement) PROSource Plus  30 mL Oral BID BM   cephALEXin  250 mg Oral QHS   feeding supplement (GLUCERNA SHAKE)  237 mL Oral BID BM   FLUoxetine  40 mg Oral QPM   insulin aspart  0-20 Units Subcutaneous TID WC   insulin glargine-yfgn  50 Units Subcutaneous QHS   methocarbamol  1,000 mg Oral BID   multivitamin with minerals  1 tablet Oral Daily   pantoprazole  40 mg Oral Daily   pioglitazone  45 mg Oral Daily   tamsulosin  0.4 mg Oral Daily    Continuous Infusions:    sodium chloride 75 mL/hr at 06/16/21 1035     LOS: 4 days     Vernell Leep, MD, [place], [facility]. [facility]    To contact the attending provider between 7A-7P or the covering provider during after hours 7P-7A, please log into the web site [email or web address] and access using universal [place] password for that web site. If you do not have the password, please call the hospital operator.  06/16/2021, 2:54 PM

## 2021-06-16 NOTE — Progress Notes (Addendum)
   Subjective:  Patient reports pain as mild.  Tolerating the [place] well.  Objective:   VITALS:   Vitals:   06/15/21 1114 06/15/21 1939 06/16/21 0431 06/16/21 1438  BP: (!) 151/41 (!) 177/54 113/60 (!) 157/50  Pulse: 79 76 81 81  Resp: 20 18 17 16   Temp: 99.4 F (37.4 C) 100 F (37.8 C) 98.8 F (37.1 C) 98.6 F (37 C)  TempSrc: Oral Oral Oral Oral  SpO2: 90%  (!) 89% (!) 89%  Weight:      Height:        Large fracture blisters throughout the knee and thigh. Venous stasis wounds are stable. Knee immobilizer is well fitting.   Lab Results  Component Value Date   WBC 7.3 06/16/2021   HGB 8.5 (L) 06/16/2021   HCT 27.1 (L) 06/16/2021   MCV 92.8 06/16/2021   PLT 318 06/16/2021     Assessment/Plan:    Left knee dislocation s/p closed reduction  - continue KI at all times, no ROM of the knee - repeat xrays yesterday showed stable alignment of the knee - ROM of left ankle ok - may undergo PT/OT to the RLE and BUEs as tolerated as long as it does not affect the LLE or the ability of the KI to adequately immobilize the left knee - again, patient not a surgical candidate given open wounds on the LLE, body habitus, and medical complexity and comorbidities -Xeroform and ABD to the fracture blisters.  Eduard Roux 06/16/2021, 6:27 PM [phone]

## 2021-06-16 NOTE — Plan of Care (Signed)
  Problem: Education: Goal: Knowledge of General Education information will improve Description: Including pain rating scale, medication(s)/side effects and non-pharmacologic comfort measures Outcome: Progressing   Problem: Clinical Measurements: Goal: Will remain free from infection Outcome: Progressing Goal: Diagnostic test results will improve Outcome: Progressing   Problem: Pain Managment: Goal: General experience of comfort will improve Outcome: Progressing   

## 2021-06-16 NOTE — Progress Notes (Signed)
Patient and wife both state leg wound has not be changed or looked at by any provider . Wife expresses concern of foul odor and purulent drainage to left shin. She states wounds look worse and should have been changed earlier today . Wound cleaned and dressing changed with assistance from wife ; multiple soiled linen under patient, which has been there all day. Patient able to partially assist with turns,  Patient cleaned . Pain medications given . He would like to be given pain medication prior to turns going forward.

## 2021-06-17 DIAGNOSIS — N179 Acute kidney failure, unspecified: Secondary | ICD-10-CM | POA: Diagnosis not present

## 2021-06-17 DIAGNOSIS — M25462 Effusion, left knee: Secondary | ICD-10-CM | POA: Diagnosis not present

## 2021-06-17 LAB — CBC WITH DIFFERENTIAL/PLATELET
Abs Immature Granulocytes: 0.08 10*3/uL — ABNORMAL HIGH (ref 0.00–0.07)
Basophils Absolute: 0.1 10*3/uL (ref 0.0–0.1)
Basophils Relative: 1 %
Eosinophils Absolute: 0.4 10*3/uL (ref 0.0–0.5)
Eosinophils Relative: 5 %
HCT: 28 % — ABNORMAL LOW (ref 39.0–52.0)
Hemoglobin: 8.7 g/dL — ABNORMAL LOW (ref 13.0–17.0)
Immature Granulocytes: 1 %
Lymphocytes Relative: 21 %
Lymphs Abs: 1.7 10*3/uL (ref 0.7–4.0)
MCH: 28.9 pg (ref 26.0–34.0)
MCHC: 31.1 g/dL (ref 30.0–36.0)
MCV: 93 fL (ref 80.0–100.0)
Monocytes Absolute: 0.7 10*3/uL (ref 0.1–1.0)
Monocytes Relative: 9 %
Neutro Abs: 5.1 10*3/uL (ref 1.7–7.7)
Neutrophils Relative %: 63 %
Platelets: 356 10*3/uL (ref 150–400)
RBC: 3.01 MIL/uL — ABNORMAL LOW (ref 4.22–5.81)
RDW: 16 % — ABNORMAL HIGH (ref 11.5–15.5)
WBC: 8 10*3/uL (ref 4.0–10.5)
nRBC: 0 % (ref 0.0–0.2)

## 2021-06-17 LAB — GLUCOSE, CAPILLARY
Glucose-Capillary: 151 mg/dL — ABNORMAL HIGH (ref 70–99)
Glucose-Capillary: 134 mg/dL — ABNORMAL HIGH (ref 70–99)
Glucose-Capillary: 137 mg/dL — ABNORMAL HIGH (ref 70–99)
Glucose-Capillary: 115 mg/dL — ABNORMAL HIGH (ref 70–99)

## 2021-06-17 LAB — PROTIME-INR
INR: 1.4 — ABNORMAL HIGH (ref 0.8–1.2)
Prothrombin Time: 16.8 seconds — ABNORMAL HIGH (ref 11.4–15.2)

## 2021-06-17 LAB — BASIC METABOLIC PANEL
Anion gap: 8 (ref 5–15)
BUN: 70 mg/dL — ABNORMAL HIGH (ref 8–23)
CO2: 24 mmol/L (ref 22–32)
Calcium: 8.1 mg/dL — ABNORMAL LOW (ref 8.9–10.3)
Chloride: 104 mmol/L (ref 98–111)
Creatinine, Ser: 1.65 mg/dL — ABNORMAL HIGH (ref 0.61–1.24)
GFR, Estimated: 46 mL/min — ABNORMAL LOW (ref >60)
Glucose, Bld: 181 mg/dL — ABNORMAL HIGH (ref 70–99)
Potassium: 4.7 mmol/L (ref 3.5–5.1)
Sodium: 136 mmol/L (ref 135–145)

## 2021-06-17 LAB — MAGNESIUM: Magnesium: 2.6 mg/dL — ABNORMAL HIGH (ref 1.7–2.4)

## 2021-06-17 NOTE — Progress Notes (Signed)
Inpatient Rehab Admissions Coordinator:   Per PT recommendations, pt was screened for CIR candidacy by Shann Medal, PT, DPT.  At this time, pt does not appear to demonstrate the medical necessity to support an inpatient rehab admission.  Would recommend f/u at a lower level of care for therapy.  No CIR consult recommended at this time.    Shann Medal, PT, DPT Admissions Coordinator [phone] 06/17/21  1:55 PM

## 2021-06-17 NOTE — Evaluation (Addendum)
Physical Therapy Evaluation Patient Details Name: Joseph [name]. MRN: [id number] DOB: Jan 21, 1956 Today's Date: 06/17/2021  History of Present Illness  Joseph [name] is a 65 yo male who presnets with increased LLE swelling after recent fall at home. 9/15 pt fell when getting off x-ray table at hospital. X-rays showed a knee dislocation which was confirmed on CT scan as a posterior lateral dislocation; L knee reduced and L KI placed. Lumbar and pelvic xrays negative. 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 Pt PLOF independent with rollator at limited distances, using bidet for toileting, spouse assisting with toenail clipping and shoe donning/doffing as needed, has a ramp to enter home- currently spouse is working on making home more handicap accessible. Ortho note from 9/20 states- "KI at all times, no ROM of the knee", "ROM of L ankle ok", and "may undergo PT/OT to the RLE and BUEs as tolerates as long as it does not affect the LLE or the ability of the KI to adequately immobilize the left knee". Pt requires +2 max A for rolling and repositioning in bed, one therapist stabilizing LLE with KI on in bed, occasional assist to reposition abdominal pannus, pt with good UE strength assisting with bed mobility. Recommend CIR at this time for aggressive strengthening prior to return home with spouse to assist as needed. Pt currently with functional limitations due to the deficits listed below (see PT Problem List). Pt will benefit from skilled PT to increase their independence and safety with mobility to allow discharge to the venue listed below.          Recommendations for follow up therapy are one component of a multi-disciplinary discharge planning process, led by the attending physician.  Recommendations may be updated based on patient status, additional functional criteria and insurance  authorization.  Follow Up Recommendations CIR    Equipment Recommendations  Other (comment) (TBD)    Recommendations for Other Services       Precautions / Restrictions Precautions Precautions: Fall Required Braces or Orthoses: Knee Immobilizer - Left Knee Immobilizer - Left: On at all times Restrictions Weight Bearing Restrictions: Yes LLE Weight Bearing: Non weight bearing (RN states NWB per MD, no WB status order noted)      Mobility  Bed Mobility Overal bed mobility: Needs Assistance Bed Mobility: Rolling Rolling: Max assist;+2 for physical assistance  General bed mobility comments: max A +2 for rolling supine to L sidelying in bed, pt pulling on bedrail to rotate upper trunk, one therapist supporting/stabilizing LLE in KI and other therapist assisting to rotate lower body; Max A +2 with bed assist using bed pad to scoot pt up in bed, pt pulling with BUE and pushing through flexed R foot on mattress    Transfers     Ambulation/Gait     Stairs            Wheelchair Mobility    Modified Rankin (Stroke Patients Only)       Balance Overall balance assessment: History of Falls        Pertinent Vitals/Pain Pain Assessment: 0-10 Pain Score: 4  Pain Location: L knee ("a little" in R knee) Pain Descriptors / Indicators: Sore;Aching Pain Intervention(s): Limited activity within patient's tolerance;Monitored during session;Repositioned    Home Living Family/patient expects to be discharged to:: Private residence Living Arrangements: Spouse/significant other Available Help at Discharge: Family;Available 24 hours/day Type of Home: House Home Access:  Ramped entrance (ramp in questionable condition)     Home Layout: One level Home Equipment: Clinical cytogeneticist - 4 wheels;Cane - single point Additional Comments: does have arm rest on side of toilet and grab bar at bed to pull up on; has a power chair but >65 years old and doesn't work    Prior Function  Level of Independence: Needs assistance   Gait / Transfers Assistance Needed: ambulates with rollator in community; no AD in house but touches walls/furniture  ADL's / Homemaking Assistance Needed: Showers using shower chair, toilets with bidet; independent with dressing except socks        Hand Dominance        Extremity/Trunk Assessment   Upper Extremity Assessment Upper Extremity Assessment: Defer to OT evaluation    Lower Extremity Assessment Lower Extremity Assessment: RLE deficits/detail;LLE deficits/detail RLE Deficits / Details: ankle ROM WNL; knee to ~60 deg flexion, able to perform SLR without extensor lag and SAQ with resistance RLE Sensation: decreased light touch (peripheral neuropathy) LLE Deficits / Details: ankle ROM WNL; L knee with KI on and NT; hip resting in external rotation and abduction, able to bring to neutral with abdominal pannus supported by therapist LLE: Unable to fully assess due to immobilization LLE Sensation: decreased light touch (peripheral neuropathy)    Cervical / Trunk Assessment Cervical / Trunk Exceptions: morbid obesity; body habitus limits  Communication   Communication: No difficulties  Cognition Arousal/Alertness: Awake/alert Behavior During Therapy: WFL for tasks assessed/performed Overall Cognitive Status: Within Functional Limits for tasks assessed         General Comments      Exercises     Assessment/Plan    PT Assessment Patient needs continued PT services  PT Problem List Decreased strength;Decreased range of motion;Decreased activity tolerance;Decreased balance;Decreased mobility;Decreased safety awareness;Decreased knowledge of precautions;Obesity;Decreased skin integrity;Pain;Impaired sensation       PT Treatment Interventions DME instruction;Gait training;Functional mobility training;Therapeutic activities;Therapeutic exercise;Balance training;Patient/family education;Wheelchair mobility training;Modalities     PT Goals (Current goals can be found in the Care Plan section)  Acute Rehab PT Goals Patient Stated Goal: "get to the toilet" PT Goal Formulation: With patient/family Time For Goal Achievement: 07/01/21 Potential to Achieve Goals: Good    Frequency Min 3X/week   Barriers to discharge        Co-evaluation               AM-PAC PT "6 Clicks" Mobility  Outcome Measure Help needed turning from your back to your side while in a flat bed without using bedrails?: Total Help needed moving from lying on your back to sitting on the side of a flat bed without using bedrails?: Total Help needed moving to and from a bed to a chair (including a wheelchair)?: Total Help needed standing up from a chair using your arms (e.g., wheelchair or bedside chair)?: Total Help needed to walk in hospital room?: Total Help needed climbing 3-5 steps with a railing? : Total 6 Click Score: 6    End of Session   Activity Tolerance: Patient tolerated treatment well;Patient limited by fatigue Patient left: in bed;with call bell/phone within reach;with family/visitor present Nurse Communication: Mobility status PT Visit Diagnosis: Other abnormalities of gait and mobility (R26.89);Muscle weakness (generalized) (M62.81);History of falling (Z91.81)    Time: 5465-6812 PT Time Calculation (min) (ACUTE ONLY): 54 min   Charges:   PT Evaluation $PT Eval Moderate Complexity: 1 Mod PT Treatments $Therapeutic Activity: 23-37 mins         [place]  Tyreon Frigon PT, DPT 06/17/21, 1:09 PM

## 2021-06-17 NOTE — Consult Note (Addendum)
WOC consult requested for left knee by the primary team.   Pt has extensive hematoma, edema, and blistering related to a knee dislocation.  This complex medical condition is beyond the [facility] scope of practice and wounds are high risk to evolve into full thickness skin loss.  Pt is being followed by ortho service for assessment and plan of care. They have provided topical treatment orders for bedside nurses to perform.  Please refer to ortho service for further questions regarding plan of care. Please re-consult if further assistance is needed.  Thank-you,  Julien Girt MSN, [place], [place], [place], [place]

## 2021-06-17 NOTE — Progress Notes (Signed)
PROGRESS NOTE   [name].  EXN:[id number]    DOB: 1955/11/20    DOA: 06/11/2021  PCP: Donald Prose, MD   I have briefly reviewed patients previous medical records in [facility].  Chief Complaint  Patient presents with   Leg Swelling   Leg Pain    left    Brief Narrative:   Lorenso Quirino. is a 65 y.o. male with PMH morbid obesity, anxiety/depression, hx left knee DVT, PE (on chronic Coumadin), GERD, HTN, IDA, nonspecific myalgias/myositis, OSA on CPAP, PVD, polyneuropathy, DMII, HLD, multiple falls who presented to the ER after another fall.    He was initially seen in the ER on 06/08/2021 after a fall at home.  He had fallen getting out of the shower.  He underwent imaging of CT C-spine, CT head, hip/pelvis x-rays, and left knee x-ray.  There were no acute findings.  He has underlying cervical spinal stenosis but no other acute findings were seen on imaging and he was discharged home. Per his daughter, he essentially was resting in bed from ER discharge until 9/15 when he had attempted to get out of bed and again had another fall.  Appears that his left knee may have been dislocated from this fall and was replaced back in socket by EMS.  In the ER he then had another fall out of bed with left knee dislocation.  Ortho was paged and performed reduction under conscious sedation.   He again underwent multiple imaging studies in the ER on 9/15. No visible acute fractures but body habitus does limit some of the studies. His CT A/P was motion degraded and was intended to evaluate for RP bleed. Of note, BP remained stable/normal and Hgb noted to be lower than 9/12 level but imaging studies do show a left knee effusion which is a presumed large hematoma due to his supratherapeutic INR on admission which was reversed some with Vit K.    He was admitted for further pain control and monitoring of left leg for any development of compartment syndrome and further evaluation as needed.     Assessment & Plan:  Principal Problem:   Left knee dislocation, initial encounter Active Problems:   Pure hypercholesterolemia   Essential hypertension, benign   OSA (obstructive sleep apnea)   Hx pulmonary embolism   Esophageal reflux   Diabetes mellitus type 2, uncontrolled (HCC)   Diabetic neuropathy (HCC)   Acute renal failure superimposed on stage 3b chronic kidney disease (HCC)   Normocytic anemia   Class 3 obesity   Aortic atherosclerosis (HCC)   Coronary atherosclerosis   Effusion of left knee   Left knee dislocation   Left knee dislocation, initial encounter - s/p fall at home x 2 this week and again in ER on 9/15 - xray shows fracture fragments as well (presumed from proximal tibia per xray read) -As per extensive communication with Dr. Erlinda Hong, orthopedics on 9/20: No weightbearing to LLE, no ROM, knee immobilizer at all times.  He indicated that the knee is very unstable.   -PT on board to mobilize patient as best as possible with restrictions per orthopedics follow-up 9/20. - Declined by CIR as not appropriate.  Difficult disposition due to her morbid, nonweightbearing on left lower extremity - eventually needs MRI knee once more stable   Effusion of left knee - concerned for large hematoma given amount of bruising and swelling with INR 6.2 on admission (treated with total of 5 mg Vit K) -  INR downtrending and responded well to vitamin K - INR now < 2 - at risk for worsening but overall has been improving since admission; pain improving, DP pulse intact - continue neurovascular checks of LLE -No surgical intervention at this time per Ortho   Normocytic anemia - likely mixed from ACD and hematoma - baseline Hgb around 12 g/dL - admitted with Hgb 9.9 g/dL with large LLE bruising and presumed hematoma/effusion - now is s/p 2 units PRBC total for admission and 1 unit FFP - Hgb stable in the mid 8 g range; bruising on knee improving - CBC daily    Acute renal  failure superimposed on stage 3b chronic kidney disease ([place]) - patient has history of CKD3a. Baseline creat ~ 1.4 -1.5, eGFR 51 -56 - may have developed an ATN from hypoperfusion during blood loss - too difficult to obtain renal u/s at this time - for now continue gentle IVF x24 hours and if downward creatinine trend continues then will stop.  And trend urine output and renal function.  Nonoliguric/1650 mL urine documented yesterday -Creatinine starting to downtrend over the last 3 days (2.87 > 2.57 > 1.93 >1.6) - continue holding BP meds - Repeat BMP in a.m.    Hx pulmonary embolism - on chronic coumadin at home, currently on hold in setting of supratherapeutic INR and L knee effusion/hematoma -Need to discuss with orthopedics regarding timing of resumption of Coumadin.   Coronary atherosclerosis - no CP - holding statin and Coumadin    Class 3 obesity - Complicates overall prognosis and care - Body mass index is 60.69 kg/m.   Diabetic neuropathy ([place]) - continue current pain regimen    Diabetes mellitus type 2, uncontrolled ([place]) - continue SSI and CBG monitoring.  Reasonable control. - continue Lantus and metformin    Esophageal reflux - continue PPI   OSA (obstructive sleep apnea) - continue qhs CPAP   Essential hypertension, benign - continue current regimen    Pure hypercholesterolemia - hold statin for now     Body mass index is 60.69 kg/m.  Nutritional Status Nutrition Problem: Inadequate oral intake Etiology: decreased appetite Signs/Symptoms: per patient/family report Interventions: Glucerna shake, MVI, Prostat  Pressure Ulcer:     DVT prophylaxis: SCDs Start: 06/11/21 2153     Code Status: Full Code Family Communication: I discussed in detail with patient's spouse via phone, updated care and answered all questions. Disposition:  Status is: Inpatient  Remains inpatient appropriate because:Inpatient level of care appropriate due to severity of  illness  Dispo: The patient is from:  Home              Anticipated d/c is to:  TBD              Patient currently is not medically stable to d/c.   Difficult to place patient Yes.  May be DTP due to morbid obesity, nonweightbearing on left lower extremity etc.        Consultants:   Orthopedics  Procedures:   Left knee reduction, 06/11/21  Antimicrobials:    Anti-infectives (From admission, onward)    Start     Dose/Rate Route Frequency Ordered Stop   06/11/21 2230  cephALEXin (KEFLEX) capsule 250 mg        250 mg Oral Daily at bedtime 06/11/21 2221           Subjective:  No new complaints.  Not much pain.  Objective:   Vitals:   06/16/21 1438 06/16/21 2300 06/17/21  0451 06/17/21 1303  BP: (!) 157/50 (!) 127/33 (!) 139/46 (!) 184/53  Pulse: 81 80 73 80  Resp: 16  15 16   Temp: 98.6 F (37 C)  97.8 F (36.6 C) 98.3 F (36.8 C)  TempSrc: Oral  Oral Oral  SpO2: (!) 89%  90% (!) 87%  Weight:      Height:        General exam: Middle-age male, moderately built and morbidly obese lying comfortably supine in bed without distress. Respiratory system: Clear to auscultation. Respiratory effort normal. Cardiovascular system: S1 & S2 heard, RRR. No JVD, murmurs, rubs, gallops or clicks. No pedal edema.  Telemetry personally reviewed: Sinus rhythm. Gastrointestinal system: Abdomen is nondistended, soft and nontender. No organomegaly or masses felt. Normal bowel sounds heard. Central nervous system: Alert and oriented. No focal neurological deficits. Extremities: Symmetric 5 x 5 power except for left lower extremity which was not assessed for power.  Left lower extremity is in immobilizer with dressing over wounds underneath. Skin: No rashes, lesions or ulcers Psychiatry: Judgement and insight appear normal. Mood & affect appropriate.   Pictures below from 06/11/2021.           Data Reviewed:   I have personally reviewed following labs and imaging  studies   CBC: Recent Labs  Lab 06/15/21 0256 06/16/21 0339 06/17/21 0325  WBC 7.7 7.3 8.0  NEUTROABS 4.8 4.5 5.1  HGB 8.7* 8.5* 8.7*  HCT 27.6* 27.1* 28.0*  MCV 92.3 92.8 93.0  PLT 283 318 208    Basic Metabolic Panel: Recent Labs  Lab 06/15/21 0256 06/16/21 0339 06/17/21 0325  NA 141 138 136  K 4.8 4.7 4.7  CL 107 105 104  CO2 26 25 24   GLUCOSE 177* 143* 181*  BUN 74* 70* 70*  CREATININE 2.57* 1.93* 1.65*  CALCIUM 8.2* 7.9* 8.1*  MG 2.3 2.4 2.6*    Liver Function Tests: Recent Labs  Lab 06/11/21 1700  AST 29  ALT 18  ALKPHOS 68  BILITOT 0.5  PROT 6.0*  ALBUMIN 2.8*    CBG: Recent Labs  Lab 06/17/21 0726 06/17/21 1255 06/17/21 1626  GLUCAP 151* 134* 137*    Microbiology Studies:   Recent Results (from the past 240 hour(s))  Resp Panel by RT-PCR (Flu A&B, Covid) Nasopharyngeal Swab     Status: None   Collection Time: 06/11/21  5:09 PM   Specimen: Nasopharyngeal Swab; Nasopharyngeal(NP) swabs in vial transport medium  Result Value Ref Range Status   SARS Coronavirus 2 by RT PCR NEGATIVE NEGATIVE Final    Comment: (NOTE) SARS-CoV-2 target nucleic acids are NOT DETECTED.  The SARS-CoV-2 RNA is generally detectable in upper respiratory specimens during the acute phase of infection. The lowest concentration of SARS-CoV-2 viral copies this assay can detect is 138 copies/mL. A negative result does not preclude SARS-Cov-2 infection and should not be used as the sole basis for treatment or other patient management decisions. A negative result may occur with  improper specimen collection/handling, submission of specimen other than nasopharyngeal swab, presence of viral mutation(s) within the areas targeted by this assay, and inadequate number of viral copies(<138 copies/mL). A negative result must be combined with clinical observations, patient history, and epidemiological information. The expected result is Negative.  Fact Sheet for Patients:   EntrepreneurPulse.com.au  Fact Sheet for Healthcare Providers:  IncredibleEmployment.be  This test is no t yet approved or cleared by the [place] FDA and  has been authorized for detection and/or diagnosis of SARS-CoV-2  by FDA under an Emergency Use Authorization (EUA). This EUA will remain  in effect (meaning this test can be used) for the duration of the COVID-19 declaration under Section 564(b)(1) of the Act, 21 U.S.C.section 360bbb-3(b)(1), unless the authorization is terminated  or revoked sooner.       Influenza A by PCR NEGATIVE NEGATIVE Final   Influenza B by PCR NEGATIVE NEGATIVE Final    Comment: (NOTE) The Xpert Xpress SARS-CoV-2/FLU/RSV plus assay is intended as an aid in the diagnosis of influenza from Nasopharyngeal swab specimens and should not be used as a sole basis for treatment. Nasal washings and aspirates are unacceptable for Xpert Xpress SARS-CoV-2/FLU/RSV testing.  Fact Sheet for Patients: EntrepreneurPulse.com.au  Fact Sheet for Healthcare Providers: IncredibleEmployment.be  This test is not yet approved or cleared by the [place] FDA and has been authorized for detection and/or diagnosis of SARS-CoV-2 by FDA under an Emergency Use Authorization (EUA). This EUA will remain in effect (meaning this test can be used) for the duration of the COVID-19 declaration under Section 564(b)(1) of the Act, 21 U.S.C. section 360bbb-3(b)(1), unless the authorization is terminated or revoked.  Performed at [facility], [place] [address], [place], [place] [address]      Radiology Studies:  DG Knee Left Port  Result Date: 06/15/2021 CLINICAL DATA:  Closed dislocation. Left knee injury from fall on 06/11/2021. Swelling and bruising. EXAM: PORTABLE LEFT KNEE - 1-2 VIEW COMPARISON:  Knee radiograph and CT 06/11/2021 FINDINGS: Previous tibiofemoral dislocation is  reduced, the tibia is minimally anterior subluxed with respect to the femur. The patella appears normally located on these portable views. Two ossified intra-articular bodies measuring 2.2 and 1.8 cm, some of the bony fragmentation adjacent to the tibial spine on CT is not well demonstrated by radiograph. Generalized subcutaneous edema. Limited assessment for knee joint effusion on provided views. IMPRESSION: 1. Reduction of previous tibiofemoral dislocation with minimal anterior subluxation of the tibia with respect to the femur. Patella appears normally located on provided views. 2. Two ossified intra-articular bodies presumably fracture fragments measuring 2.2 and 1.8 cm. Electronically Signed   By: Keith Rake M.D.   On: 06/15/2021 17:32     Scheduled Meds:    (feeding supplement) PROSource Plus  30 mL Oral BID BM   cephALEXin  250 mg Oral QHS   feeding supplement (GLUCERNA SHAKE)  237 mL Oral BID BM   FLUoxetine  40 mg Oral QPM   insulin aspart  0-20 Units Subcutaneous TID WC   insulin glargine-yfgn  50 Units Subcutaneous QHS   methocarbamol  1,000 mg Oral BID   multivitamin with minerals  1 tablet Oral Daily   pantoprazole  40 mg Oral Daily   pioglitazone  45 mg Oral Daily   tamsulosin  0.4 mg Oral Daily    Continuous Infusions:    sodium chloride 50 mL/hr at 06/17/21 0024     LOS: 5 days     Vernell Leep, MD, [place], [facility]. [facility]    To contact the attending provider between 7A-7P or the covering provider during after hours 7P-7A, please log into the web site [email or web address] and access using universal [place] password for that web site. If you do not have the password, please call the hospital operator.  06/17/2021, 4:49 PM

## 2021-06-18 DIAGNOSIS — M25462 Effusion, left knee: Secondary | ICD-10-CM | POA: Diagnosis not present

## 2021-06-18 DIAGNOSIS — N179 Acute kidney failure, unspecified: Secondary | ICD-10-CM | POA: Diagnosis not present

## 2021-06-18 LAB — BASIC METABOLIC PANEL
Anion gap: 4 — ABNORMAL LOW (ref 5–15)
BUN: 69 mg/dL — ABNORMAL HIGH (ref 8–23)
CO2: 25 mmol/L (ref 22–32)
Calcium: 8.4 mg/dL — ABNORMAL LOW (ref 8.9–10.3)
Chloride: 110 mmol/L (ref 98–111)
Creatinine, Ser: 1.52 mg/dL — ABNORMAL HIGH (ref 0.61–1.24)
GFR, Estimated: 51 mL/min — ABNORMAL LOW (ref >60)
Glucose, Bld: 268 mg/dL — ABNORMAL HIGH (ref 70–99)
Potassium: 5.2 mmol/L — ABNORMAL HIGH (ref 3.5–5.1)
Sodium: 139 mmol/L (ref 135–145)

## 2021-06-18 LAB — GLUCOSE, CAPILLARY
Glucose-Capillary: 225 mg/dL — ABNORMAL HIGH (ref 70–99)
Glucose-Capillary: 277 mg/dL — ABNORMAL HIGH (ref 70–99)
Glucose-Capillary: 256 mg/dL — ABNORMAL HIGH (ref 70–99)
Glucose-Capillary: 232 mg/dL — ABNORMAL HIGH (ref 70–99)
Glucose-Capillary: 140 mg/dL — ABNORMAL HIGH (ref 70–99)
Glucose-Capillary: 185 mg/dL — ABNORMAL HIGH (ref 70–99)

## 2021-06-18 LAB — PROTIME-INR
INR: 1.3 — ABNORMAL HIGH (ref 0.8–1.2)
Prothrombin Time: 15.8 seconds — ABNORMAL HIGH (ref 11.4–15.2)

## 2021-06-18 MED ORDER — DOCUSATE SODIUM 100 MG PO CAPS
100.0000 mg | ORAL_CAPSULE | Freq: Two times a day (BID) | ORAL | Status: DC
  Administered 2021-06-18 – 2021-07-07 (×31): 100 mg via ORAL
  Filled 2021-06-18 (×34): qty 1

## 2021-06-18 MED ORDER — POLYETHYLENE GLYCOL 3350 17 G PO PACK
17.0000 g | PACK | Freq: Every day | ORAL | Status: DC
  Administered 2021-06-18 – 2021-06-20 (×3): 17 g via ORAL
  Filled 2021-06-18 (×3): qty 1

## 2021-06-18 MED ORDER — INSULIN ASPART 100 UNIT/ML IJ SOLN
11.0000 [IU] | Freq: Once | INTRAMUSCULAR | Status: AC
  Administered 2021-06-18: 11 [IU] via SUBCUTANEOUS

## 2021-06-18 MED ORDER — INSULIN ASPART 100 UNIT/ML IJ SOLN
0.0000 [IU] | Freq: Every day | INTRAMUSCULAR | Status: DC
  Administered 2021-06-29 – 2021-07-11 (×4): 2 [IU] via SUBCUTANEOUS

## 2021-06-18 MED ORDER — INSULIN ASPART 100 UNIT/ML IJ SOLN
0.0000 [IU] | Freq: Three times a day (TID) | INTRAMUSCULAR | Status: DC
  Administered 2021-06-18: 3 [IU] via SUBCUTANEOUS
  Administered 2021-06-18: 7 [IU] via SUBCUTANEOUS
  Administered 2021-06-19: 4 [IU] via SUBCUTANEOUS
  Administered 2021-06-19 (×2): 3 [IU] via SUBCUTANEOUS
  Administered 2021-06-20: 4 [IU] via SUBCUTANEOUS
  Administered 2021-06-20: 3 [IU] via SUBCUTANEOUS
  Administered 2021-06-21 – 2021-06-22 (×4): 4 [IU] via SUBCUTANEOUS
  Administered 2021-06-22 (×2): 7 [IU] via SUBCUTANEOUS
  Administered 2021-06-23 (×2): 4 [IU] via SUBCUTANEOUS
  Administered 2021-06-24: 7 [IU] via SUBCUTANEOUS
  Administered 2021-06-24 (×2): 4 [IU] via SUBCUTANEOUS
  Administered 2021-06-25: 7 [IU] via SUBCUTANEOUS
  Administered 2021-06-25: 11 [IU] via SUBCUTANEOUS
  Administered 2021-06-25: 3 [IU] via SUBCUTANEOUS
  Administered 2021-06-26 (×2): 4 [IU] via SUBCUTANEOUS
  Administered 2021-06-26: 7 [IU] via SUBCUTANEOUS
  Administered 2021-06-27 (×2): 4 [IU] via SUBCUTANEOUS
  Administered 2021-06-27: 7 [IU] via SUBCUTANEOUS
  Administered 2021-06-28: 3 [IU] via SUBCUTANEOUS
  Administered 2021-06-28: 4 [IU] via SUBCUTANEOUS
  Administered 2021-06-28 – 2021-06-29 (×3): 3 [IU] via SUBCUTANEOUS
  Administered 2021-06-30: 4 [IU] via SUBCUTANEOUS
  Administered 2021-06-30 – 2021-07-01 (×2): 3 [IU] via SUBCUTANEOUS
  Administered 2021-07-02: 7 [IU] via SUBCUTANEOUS
  Administered 2021-07-02 (×2): 4 [IU] via SUBCUTANEOUS
  Administered 2021-07-03: 7 [IU] via SUBCUTANEOUS
  Administered 2021-07-03: 4 [IU] via SUBCUTANEOUS
  Administered 2021-07-03 – 2021-07-04 (×2): 7 [IU] via SUBCUTANEOUS
  Administered 2021-07-04 – 2021-07-05 (×5): 4 [IU] via SUBCUTANEOUS
  Administered 2021-07-06 – 2021-07-07 (×3): 3 [IU] via SUBCUTANEOUS
  Administered 2021-07-08: 4 [IU] via SUBCUTANEOUS
  Administered 2021-07-08: 3 [IU] via SUBCUTANEOUS
  Administered 2021-07-09: 4 [IU] via SUBCUTANEOUS
  Administered 2021-07-09 (×2): 7 [IU] via SUBCUTANEOUS
  Administered 2021-07-10: 4 [IU] via SUBCUTANEOUS
  Administered 2021-07-11: 7 [IU] via SUBCUTANEOUS
  Administered 2021-07-11 – 2021-07-12 (×4): 4 [IU] via SUBCUTANEOUS
  Administered 2021-07-13 – 2021-07-14 (×4): 3 [IU] via SUBCUTANEOUS
  Administered 2021-07-15 (×3): 4 [IU] via SUBCUTANEOUS
  Administered 2021-07-16 – 2021-07-17 (×3): 3 [IU] via SUBCUTANEOUS
  Administered 2021-07-18: 4 [IU] via SUBCUTANEOUS
  Administered 2021-07-18: 7 [IU] via SUBCUTANEOUS
  Administered 2021-07-18: 4 [IU] via SUBCUTANEOUS
  Administered 2021-07-19 – 2021-07-20 (×4): 7 [IU] via SUBCUTANEOUS
  Administered 2021-07-20 (×2): 4 [IU] via SUBCUTANEOUS
  Administered 2021-07-21: 3 [IU] via SUBCUTANEOUS
  Administered 2021-07-21: 4 [IU] via SUBCUTANEOUS
  Administered 2021-07-23: 3 [IU] via SUBCUTANEOUS
  Administered 2021-07-23: 4 [IU] via SUBCUTANEOUS
  Administered 2021-07-23: 3 [IU] via SUBCUTANEOUS
  Administered 2021-07-24: 4 [IU] via SUBCUTANEOUS
  Administered 2021-07-24 – 2021-07-28 (×5): 3 [IU] via SUBCUTANEOUS

## 2021-06-18 MED ORDER — INSULIN ASPART 100 UNIT/ML IJ SOLN
3.0000 [IU] | Freq: Three times a day (TID) | INTRAMUSCULAR | Status: DC
  Administered 2021-06-18 – 2021-07-28 (×80): 3 [IU] via SUBCUTANEOUS

## 2021-06-18 NOTE — TOC Progression Note (Addendum)
Transition of Care (TOC) - Progression Note    Patient Details  Name: Antario Yasuda. MRN: [id number] Date of Birth: [birth year]  Transition of Care [facility]) CM/SW Contact  Ross Ludwig, [place] Phone Number: 06/18/2021, 5:13 PM  Clinical Narrative:     CSW spoke to patient's wife Benjamine Mola regarding SNF vs inpatient rehab.  Patient's wife is hoping that patient can be reviewed by [facility] inpatient reahab again, because she would rather stay locally verse going out of the [place] area.  CSW explained to her that due to patient's weight, it will be limited on where he can go for rehab.  Patient's wife was given SNF bed offers, and she was also explained that [facility] inpatient rehab in [place] will review patient, and CSW will ask CIR to look at patient again.  Patient's wife asked why CIR said no, CSW will ask CIR rehab admissions coordinator to call wife and explain in more detail why they can not accept patient.  CSW to continue to follow patient's progress throughout discharge planning.  Per patient's wife, he has received two covid vaccines, but not the booster.        Expected Discharge Plan and Services                                                 Social Determinants of Health (SDOH) Interventions    Readmission Risk Interventions No flowsheet data found.

## 2021-06-18 NOTE — Care Management Important Message (Signed)
Important Message  Patient Details IM Letter given to the Patient. Name: Joseph [name]. MRN: [id number] Date of Birth: January 16, 1956   Medicare Important Message Given:  Yes     Kerin Salen 06/18/2021, 12:05 PM

## 2021-06-18 NOTE — NC FL2 (Signed)
[place] LEVEL OF CARE SCREENING TOOL     IDENTIFICATION  Patient Name: Joseph [name]. Birthdate: [birth year] Sex: male Admission Date (Current Location): 06/11/2021  [facility] and [place] Number:  Herbalist and Address:  [facility],  [place], [place]      Provider Number: [id number]  Attending Physician Name and Address:  Modena Jansky, MD  Relative Name and Phone Number:  Fogel,Elizabeth Spouse [phone]  [phone]    Current Level of Care: Hospital Recommended Level of Care: [place] Prior Approval Number:    Date Approved/Denied:   PASRR Number: [id number]  Discharge Plan: SNF    Current Diagnoses: Patient Active Problem List   Diagnosis Date Noted   Aortic atherosclerosis ([place]) 06/12/2021   Coronary atherosclerosis 06/12/2021   Effusion of left knee 06/12/2021   Left knee dislocation 06/12/2021   Left knee dislocation, initial encounter 06/11/2021   Normocytic anemia 06/11/2021   Class 3 obesity 06/11/2021   Osteomyelitis of great toe of right foot ([place]) 04/03/2020   Osteomyelitis of great toe of left foot ([place]) 04/03/2020   Acute renal failure superimposed on stage 3b chronic kidney disease ([place]) 04/03/2020   Monitoring for anticoagulant use 02/19/2020   Accumulation of fluid in tissues 02/19/2020   Anxiety state 02/19/2020   Diabetes mellitus type 2, uncontrolled ([place]) 02/19/2020   Diabetes mellitus with polyneuropathy ([place]) 02/19/2020   Fibrositis 02/19/2020   Peripheral blood vessel disorder ([place]) 02/19/2020   Varicose veins of lower extremities with inflammation 02/19/2020   Acute renal insufficiency 01/03/2015   Esophageal reflux 01/02/2015   Polyneuropathy in diabetes(357.2) 01/02/2015   Abscess of postoperative wound of abdominal wall 01/02/2015   Cellulitis and abscess of trunk 12/31/2014   S/P IVC filter-temporary REMOVED    Status post panniculectomy Oct 2015  07/17/2014   Morbid obesity BMI 65 03/22/2014   Pure hypercholesterolemia 03/22/2014   Essential hypertension, benign 03/22/2014   Diabetes ([place]) 03/22/2014   Panniculitis 03/22/2014   OSA (obstructive sleep apnea) 03/22/2014   Hx pulmonary embolism 03/22/2014   Diabetic neuropathy ([place]) 02/20/2014    Orientation RESPIRATION BLADDER Height & Weight     Self, Time, Situation, Place  O2 (2L) Incontinent Weight: (!) 460 lb (208.7 kg) Height:  6\' 1"  (185.4 cm)  BEHAVIORAL SYMPTOMS/MOOD NEUROLOGICAL BOWEL NUTRITION STATUS      Continent Diet (Low Fat, Low Cholesterol)  AMBULATORY STATUS COMMUNICATION OF NEEDS Skin   Limited Assist Verbally Skin abrasions, Surgical wounds                       Personal Care Assistance Level of Assistance  Bathing, Feeding, Dressing Bathing Assistance: Limited assistance Feeding assistance: Independent Dressing Assistance: Limited assistance     Functional Limitations Info  Sight, Hearing, Speech Sight Info: Adequate Hearing Info: Adequate Speech Info: Adequate    SPECIAL CARE FACTORS FREQUENCY  PT (By licensed PT), OT (By licensed OT)     PT Frequency: Minimum 5x a week OT Frequency: Minimum 5x a week            Contractures Contractures Info: Not present    Additional Factors Info  Code Status, Allergies, Psychotropic, Insulin Sliding Scale Code Status Info: Full Code Allergies Info: Tape   Chlorhexidine Psychotropic Info: FLUoxetine (PROZAC) capsule 40 mg Insulin Sliding Scale Info: insulin aspart (novoLOG) injection 0-20 Units 3x a day with meals       Current Medications (06/18/2021):  This is the current hospital active medication list Current Facility-Administered Medications  Medication Dose Route Frequency Provider Last Rate Last Admin   (feeding supplement) PROSource Plus liquid 30 mL  30 mL Oral BID BM Dwyane Dee, MD   30 mL at 06/18/21 1134   acetaminophen (TYLENOL) tablet 1,000 mg  1,000 mg Oral Q6H PRN  Reubin Milan, MD   1,000 mg at 06/17/21 1815   Or   acetaminophen (TYLENOL) suppository 650 mg  650 mg Rectal Q4H PRN Reubin Milan, MD       cephALEXin [facility]) capsule 250 mg  250 mg Oral QHS Reubin Milan, MD   250 mg at 06/17/21 2110   docusate sodium (COLACE) capsule 100 mg  100 mg Oral BID Vernell Leep D, MD   100 mg at 06/18/21 1132   feeding supplement (GLUCERNA SHAKE) (GLUCERNA SHAKE) liquid 237 mL  237 mL Oral BID BM Dwyane Dee, MD   237 mL at 06/16/21 1733   FLUoxetine (PROZAC) capsule 40 mg  40 mg Oral QPM Reubin Milan, MD   40 mg at 06/17/21 1814   HYDROmorphone (DILAUDID) injection 1 mg  1 mg Intravenous Q3H PRN Reubin Milan, MD   1 mg at 06/16/21 2027   insulin aspart (novoLOG) injection 0-20 Units  0-20 Units Subcutaneous TID WC Modena Jansky, MD   7 Units at 06/18/21 1133   insulin aspart (novoLOG) injection 0-5 Units  0-5 Units Subcutaneous QHS Hongalgi, Anand D, MD       insulin aspart (novoLOG) injection 3 Units  3 Units Subcutaneous TID WC Modena Jansky, MD   3 Units at 06/18/21 1132   insulin glargine-yfgn (SEMGLEE) injection 50 Units  50 Units Subcutaneous QHS Reubin Milan, MD   50 Units at 06/17/21 2109   methocarbamol (ROBAXIN) tablet 1,000 mg  1,000 mg Oral BID Reubin Milan, MD   1,000 mg at 06/18/21 1761   multivitamin with minerals tablet 1 tablet  1 tablet Oral Daily Dwyane Dee, MD   1 tablet at 06/18/21 0917   ondansetron (ZOFRAN) tablet 4 mg  4 mg Oral Q6H PRN Reubin Milan, MD       Or   ondansetron [facility]) injection 4 mg  4 mg Intravenous Q6H PRN Reubin Milan, MD   4 mg at 06/16/21 2027   oxyCODONE (Oxy IR/ROXICODONE) immediate release tablet 10 mg  10 mg Oral Q6H PRN Reubin Milan, MD   10 mg at 06/18/21 0434   pantoprazole (PROTONIX) EC tablet 40 mg  40 mg Oral Daily Reubin Milan, MD   40 mg at 06/18/21 6073   pioglitazone (ACTOS) tablet 45 mg  45 mg Oral Daily Reubin Milan, MD   45 mg at 06/18/21 7106   polyethylene glycol (MIRALAX / GLYCOLAX) packet 17 g  17 g Oral Daily Vernell Leep D, MD   17 g at 06/18/21 1132   silver sulfADIAZINE (SILVADENE) 1 % cream 1 application  1 application Topical Daily PRN Reubin Milan, MD   1 application at 26/94/85 0900   tamsulosin (FLOMAX) capsule 0.4 mg  0.4 mg Oral Daily Reubin Milan, MD   0.4 mg at 06/18/21 4627     Discharge Medications: Please see discharge summary for a list of discharge medications.  Relevant Imaging Results:  Relevant Lab Results:   Additional Information SSN [id number]  Ross Ludwig, LCSW

## 2021-06-18 NOTE — Evaluation (Signed)
Occupational Therapy Evaluation Patient Details Name: Joseph [name]. MRN: [id number] DOB: [birth year] Today's Date: 06/18/2021   History of Present Illness Joseph [name] is a 65 yo male who presnets with increased LLE swelling after recent fall at home. 9/15 pt fell when getting off x-ray table at hospital. X-rays showed a knee dislocation which was confirmed on CT scan as a posterior lateral dislocation; L knee reduced and L KI placed. Lumbar and pelvic xrays negative. 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 Patient presents with decreased ROM and strength of LLE, impaired balance and decreased activity tolerance. Patient's UB strength is functional but needs to be increased to compensate for NWB status. Patient's particular body habitus with large anterior yet left leaning pannus presents further difficulties with pannus pressing on to left injured knee and making LB ADLs from seated position near impossible. Currently patient requires +2 assistance, bed rails and adjustable HOB to transfer to side of bed safely with knee in extension, is set up to min assist for UB ADLs at edge of bed and max-total assist for LB ADLs at bed level. Patient will benefit from skilled OT services while in hospital to improve deficits and learn compensatory strategies in order to reduce caregiver burden. Patient will need short term rehab at discharge.     Recommendations for follow up therapy are one component of a multi-disciplinary discharge planning process, led by the attending physician.  Recommendations may be updated based on patient status, additional functional criteria and insurance authorization.   Follow Up Recommendations  SNF;CIR    Equipment Recommendations  Other (comment)  (TBD)    Recommendations for Other Services       Precautions / Restrictions Precautions Precautions: Fall Required Braces or Orthoses: Knee Immobilizer - Left Knee Immobilizer - Left: On at all times Restrictions Weight Bearing Restrictions: Yes LLE Weight Bearing: Non weight bearing      Mobility Bed Mobility Overal bed mobility: Needs Assistance Bed Mobility: Supine to Sit;Sit to Supine Rolling: Mod assist;+2 for physical assistance;+2 for safety/equipment   Supine to sit: +2 for physical assistance;+2 for safety/equipment;Max assist     General bed mobility comments: Mod x 2 (one person being patient's hand hold to pull up on) the other supporting LLE in extension - heavy reliance on bed rails and Positioning of HOB to transfer in to sitting. More assistance for return to supine for increased weight of LEs, pivoting of hips and scooting up in bed. Patient assisting as much as possible with UEs. +3 to pull up in bed.    Transfers                      Balance Overall balance assessment: Needs assistance Sitting-balance support: No upper extremity supported Sitting balance-Leahy Scale: Fair Sitting balance - Comments: fair at edge of bed - but min guard for safety                                   ADL either performed or assessed with clinical judgement   ADL Overall ADL's : Needs assistance/impaired Eating/Feeding: Independent   Grooming: Set up;Sitting   Upper Body Bathing: Minimal  assistance;Sitting   Lower Body Bathing: Maximal assistance;Bed level   Upper Body Dressing : Set up;Sitting   Lower Body Dressing: Total assistance;Bed level   Toilet Transfer: Total assistance   Toileting- Clothing Manipulation and Hygiene: Total assistance;Bed level               Vision Baseline Vision/History: 1 Wears glasses Patient Visual Report: No change from baseline       Perception     Praxis      Pertinent Vitals/Pain Pain  Assessment: Faces Faces Pain Scale: Hurts even more Pain Location: Left knee Pain Descriptors / Indicators: Grimacing;Aching;Sore;Discomfort;Guarding Pain Intervention(s): Limited activity within patient's tolerance     Hand Dominance Right   Extremity/Trunk Assessment Upper Extremity Assessment Upper Extremity Assessment: RUE deficits/detail;LUE deficits/detail RUE Deficits / Details: 4+/5 shoulder strength, 4/5 tricep strength, 5/5 bicep, 5/5 wrist, 5/5 grip RUE Sensation: WNL RUE Coordination: WNL LUE Deficits / Details: 4+/5 shoulder strength, 4/5 tricep strength, 5/5 bicep, 5/5 wrist, 5/5 grip LUE Sensation: WNL LUE Coordination: WNL   Lower Extremity Assessment Lower Extremity Assessment: Defer to PT evaluation   Cervical / Trunk Assessment Cervical / Trunk Exceptions: morbid obesity; body habitus limits   Communication Communication Communication: No difficulties   Cognition Arousal/Alertness: Awake/alert Behavior During Therapy: WFL for tasks assessed/performed Overall Cognitive Status: Within Functional Limits for tasks assessed                                     General Comments       Exercises     Shoulder Instructions      Home Living Family/patient expects to be discharged to:: Private residence Living Arrangements: Spouse/significant other Available Help at Discharge: Family;Available 24 hours/day Type of Home: House Home Access: Ramped entrance (ramp in questionable condition)     Home Layout: One level     Bathroom Shower/Tub: Occupational psychologist: Standard (bidet)     Home Equipment: Clinical cytogeneticist - 4 wheels;Cane - single point   Additional Comments: does have arm rest on side of toilet and grab bar at bed to pull up on; has a power chair but >15 years old and doesn't work      Prior Functioning/Environment Level of Independence: Needs assistance  Gait / Transfers Assistance Needed: ambulates with  rollator in community; no AD in house but touches walls/furniture ADL's / Homemaking Assistance Needed: Showers using shower chair, toilets with bidet; independent with dressing except socks            OT Problem List: Decreased strength;Decreased range of motion;Decreased activity tolerance;Impaired balance (sitting and/or standing);Pain;Obesity;Decreased knowledge of precautions;Decreased knowledge of use of DME or AE;Increased edema      OT Treatment/Interventions: Self-care/ADL training;Therapeutic exercise;DME and/or AE instruction;Therapeutic activities;Patient/family education    OT Goals(Current goals can be found in the care plan section) Acute Rehab OT Goals Patient Stated Goal: do whatever he can OT Goal Formulation: With patient Time For Goal Achievement: 07/02/21 Potential to Achieve Goals: Fair  OT Frequency: Min 2X/week   Barriers to D/C:            Co-evaluation              AM-PAC OT "6 Clicks" Daily Activity     Outcome Measure Help from another person eating meals?: None Help from another person taking care of personal grooming?: A Little Help from another person toileting, which includes  using toliet, bedpan, or urinal?: Total Help from another person bathing (including washing, rinsing, drying)?: A Lot Help from another person to put on and taking off regular upper body clothing?: A Little Help from another person to put on and taking off regular lower body clothing?: Total 6 Click Score: 14   End of Session Nurse Communication: Mobility status  Activity Tolerance: Patient tolerated treatment well Patient left: in bed;with call bell/phone within reach;with nursing/sitter in room  OT Visit Diagnosis: Other abnormalities of gait and mobility (R26.89);Unsteadiness on feet (R26.81);Muscle weakness (generalized) (M62.81);Repeated falls (R29.6);Pain                Time: 4496-7591 OT Time Calculation (min): 29 min Charges:  OT General Charges $OT  Visit: 1 Visit OT Evaluation $OT Eval Moderate Complexity: 1 Mod OT Treatments $Self Care/Home Management : 8-22 mins  Makiyah Zentz, OTR/L [place]  Office [phone] Pager: [phone]   Joseph [name] 06/18/2021, 2:47 PM

## 2021-06-18 NOTE — Progress Notes (Signed)
PROGRESS NOTE   Joseph [name].  IRC:[id number]    DOB: 05-01-1956    DOA: 06/11/2021  PCP: Donald Prose, MD   I have briefly reviewed patients previous medical records in [facility].  Chief Complaint  Patient presents with   Leg Swelling   Leg Pain    left    Brief Narrative:   Joseph [name]. is a 65 y.o. male with PMH morbid obesity, anxiety/depression, hx left knee DVT, PE (on chronic Coumadin), GERD, HTN, IDA, nonspecific myalgias/myositis, OSA on CPAP, PVD, polyneuropathy, DMII, HLD, multiple falls who presented to the ER after another fall.    Joseph [name] was initially seen in the ER on 06/08/2021 after a fall at home.  Joseph [name] had fallen getting out of the shower.  Joseph [name] underwent imaging of CT C-spine, CT head, hip/pelvis x-rays, and left knee x-ray.  There were no acute findings.  Joseph [name] has underlying cervical spinal stenosis but no other acute findings were seen on imaging and Joseph [name] was discharged home. Per his daughter, Joseph [name] essentially was resting in bed from ER discharge until 9/15 when Joseph [name] had attempted to get out of bed and again had another fall.  Appears that his left knee may have been dislocated from this fall and was replaced back in socket by EMS.  In the ER Joseph [name] then had another fall out of bed with left knee dislocation.  Ortho was paged and performed reduction under conscious sedation.   Joseph [name] again underwent multiple imaging studies in the ER on 9/15. No visible acute fractures but body habitus does limit some of the studies. His CT A/P was motion degraded and was intended to evaluate for RP bleed. Of note, BP remained stable/normal and Hgb noted to be lower than 9/12 level but imaging studies do show a left knee effusion which is a presumed large hematoma due to his supratherapeutic INR on admission which was reversed some with Vit K.    Joseph [name] was admitted for further pain control and monitoring of left leg for any development of compartment syndrome and further evaluation as needed.     Assessment & Plan:  Principal Problem:   Left knee dislocation, initial encounter Active Problems:   Pure hypercholesterolemia   Essential hypertension, benign   OSA (obstructive sleep apnea)   Hx pulmonary embolism   Esophageal reflux   Diabetes mellitus type 2, uncontrolled (HCC)   Diabetic neuropathy (HCC)   Acute renal failure superimposed on stage 3b chronic kidney disease (HCC)   Normocytic anemia   Class 3 obesity   Aortic atherosclerosis (HCC)   Coronary atherosclerosis   Effusion of left knee   Left knee dislocation   Left knee dislocation, initial encounter - s/p fall at home x 2 this week and again in ER on 9/15 - xray shows fracture fragments as well (presumed from proximal tibia per xray read) -As per extensive communication with Dr. Erlinda Hong, orthopedics on 9/20: No weightbearing to LLE, no ROM, knee immobilizer at all times.  Joseph [name] indicated that the knee is very unstable.   -PT on board to mobilize patient as best as possible with restrictions per orthopedics follow-up 9/20. - Declined by CIR as not appropriate.  Difficult disposition due to very morbid obesity, nonweightbearing on left lower extremity -Examined left leg wounds on 9/22, pictures as below. - eventually needs MRI knee once more stable   Effusion of left knee - concerned for large hematoma given amount of bruising and swelling with INR 6.2  on admission (treated with total of 5 mg Vit K) -INR downtrending and responded well to vitamin K - INR now < 2 - at risk for worsening but overall has been improving since admission; pain improving, DP pulse intact - continue neurovascular checks of LLE -No surgical intervention at this time per Ortho   Normocytic anemia - likely mixed from ACD and hematoma - baseline Hgb around 12 g/dL - admitted with Hgb 9.9 g/dL with large LLE bruising and presumed hematoma/effusion - now is s/p 2 units PRBC total for admission and 1 unit FFP - Hgb stable in the mid 8 g  range; bruising on knee improving - CBC daily    Acute renal failure superimposed on stage 3b chronic kidney disease ([place]) - patient has history of CKD3a. Baseline creat ~ 1.4 -1.5, eGFR 51 -56 - may have developed an ATN from hypoperfusion during blood loss - too difficult to obtain renal u/s at this time - for now continue gentle IVF x24 hours and if downward creatinine trend continues then will stop.  And trend urine output and renal function.  Nonoliguric/1650 mL urine documented yesterday -Creatinine starting to downtrend over the last 3 days (2.87 > 2.57 > 1.93 >1.6 >1.52) - continue holding BP meds - Repeat BMP in a.m.  Mild hyperkalemia Potassium 5.2.  No mention about hemolysis.  Unclear cause.  Follow-up BMP in a.m.    Hx pulmonary embolism - on chronic coumadin at home, currently on hold in setting of supratherapeutic INR and L knee effusion/hematoma -Need to discuss with orthopedics regarding timing of resumption of Coumadin> checking with Ortho   Coronary atherosclerosis - no CP - holding statin and Coumadin    Class 3 obesity - Complicates overall prognosis and care - Body mass index is 60.69 kg/m.   Diabetic neuropathy ([place]) - continue current pain regimen    Diabetes mellitus type 2, uncontrolled ([place]) - continue SSI and CBG monitoring.  Reasonable control. - continue Lantus and metformin  This was reasonably controlled until evening of 9/21 and then worsened overnight, patient reports due to high calorie/carb diet for dinner.  Recommended cutting back on the same. - As discussed with patient, might consider increasing Lantus, adjusting other insulins.  For now Joseph [name] is agreeable to adding low-dose NovoLog meal cover and adding SSI bedtime scale.  Monitor closely.   Esophageal reflux - continue PPI   OSA (obstructive sleep apnea) - continue qhs CPAP   Essential hypertension, benign - continue current regimen    Pure hypercholesterolemia - hold statin for  now    Constipation Patient agreeable to mild bowel regimen to begin with.  Body mass index is 60.69 kg/m.  Nutritional Status Nutrition Problem: Inadequate oral intake Etiology: decreased appetite Signs/Symptoms: per patient/family report Interventions: Glucerna shake, MVI, Prostat  Pressure Ulcer:     DVT prophylaxis: SCDs Start: 06/11/21 2153     Code Status: Full Code Family Communication: I discussed in detail with patient's spouse at bedside, updated care and answered all questions. Disposition:  Status is: Inpatient  Remains inpatient appropriate because:Inpatient level of care appropriate due to severity of illness  Dispo: The patient is from:  Home              Anticipated d/c is to:  TBD              Patient currently is not medically stable to d/c.   Difficult to place patient Yes.  May be DTP due to morbid obesity, nonweightbearing  on left lower extremity etc.        Consultants:   Orthopedics  Procedures:   Left knee reduction, 06/11/21  Antimicrobials:    Anti-infectives (From admission, onward)    Start     Dose/Rate Route Frequency Ordered Stop   06/11/21 2230  cephALEXin (KEFLEX) capsule 250 mg        250 mg Oral Daily at bedtime 06/11/21 2221           Subjective:  Constipation.  No BM for approximately 7 days but patient denies abdominal pain, nausea or vomiting.  Reports chronic constipation at home with BM every 2 to 3 days.  Extremely reluctant to try bowel regimen.  Objective:   Vitals:   06/17/21 1951 06/18/21 0442 06/18/21 0500 06/18/21 1127  BP: (!) 151/38 (!) 169/29 (!) 157/41 (!) 162/47  Pulse: 85 81 79 76  Resp: 16 18  (!) 22  Temp: 98.4 F (36.9 C) 98 F (36.7 C)  98 F (36.7 C)  TempSrc: Oral Oral  Oral  SpO2: 96% 92%  90%  Weight:      Height:        General exam: Middle-age male, moderately built and morbidly obese lying comfortably supine in bed without distress. Respiratory system: Clear to auscultation.  Respiratory effort normal. Cardiovascular system: S1 & S2 heard, RRR. No JVD, murmurs, rubs, gallops or clicks. No pedal edema.  Telemetry personally reviewed: Sinus rhythm-discontinued 9/22 and patient wanted desperately removed as well. Gastrointestinal system: Abdomen is nondistended, soft and nontender. No organomegaly or masses felt. Normal bowel sounds heard. Central nervous system: Alert and oriented. No focal neurological deficits. Extremities: Symmetric 5 x 5 power except for left lower extremity which was not assessed for power.  Left lower extremity is in immobilizer with dressing over wounds underneath. Skin: No rashes, lesions or ulcers Psychiatry: Judgement and insight appear normal. Mood & affect appropriate.   Pictures below from 06/18/2021, appears stable.      Data Reviewed:   I have personally reviewed following labs and imaging studies   CBC: Recent Labs  Lab 06/15/21 0256 06/16/21 0339 06/17/21 0325  WBC 7.7 7.3 8.0  NEUTROABS 4.8 4.5 5.1  HGB 8.7* 8.5* 8.7*  HCT 27.6* 27.1* 28.0*  MCV 92.3 92.8 93.0  PLT 283 318 001    Basic Metabolic Panel: Recent Labs  Lab 06/15/21 0256 06/16/21 0339 06/17/21 0325 06/18/21 0340  NA 141 138 136 139  K 4.8 4.7 4.7 5.2*  CL 107 105 104 110  CO2 _0 GLUCOSE 177* 143* 181* 268*  BUN 74* 70* 70* 69*  CREATININE 2.57* 1.93* 1.65* 1.52*  CALCIUM 8.2* 7.9* 8.1* 8.4*  MG 2.3 2.4 2.6*  --     Liver Function Tests: Recent Labs  Lab 06/11/21 1700  AST 29  ALT 18  ALKPHOS 68  BILITOT 0.5  PROT 6.0*  ALBUMIN 2.8*    CBG: Recent Labs  Lab 06/18/21 0719 06/18/21 1120 06/18/21 1555  GLUCAP 256* 232* 140*    Microbiology Studies:   Recent Results (from the past 240 hour(s))  Resp Panel by RT-PCR (Flu A&B, Covid) Nasopharyngeal Swab     Status: None   Collection Time: 06/11/21  5:09 PM   Specimen: Nasopharyngeal Swab; Nasopharyngeal(NP) swabs in vial transport medium  Result Value Ref Range  Status   SARS Coronavirus 2 by RT PCR NEGATIVE NEGATIVE Final    Comment: (NOTE) SARS-CoV-2 target nucleic acids are NOT DETECTED.  The  SARS-CoV-2 RNA is generally detectable in upper respiratory specimens during the acute phase of infection. The lowest concentration of SARS-CoV-2 viral copies this assay can detect is 138 copies/mL. A negative result does not preclude SARS-Cov-2 infection and should not be used as the sole basis for treatment or other patient management decisions. A negative result may occur with  improper specimen collection/handling, submission of specimen other than nasopharyngeal swab, presence of viral mutation(s) within the areas targeted by this assay, and inadequate number of viral copies(<138 copies/mL). A negative result must be combined with clinical observations, patient history, and epidemiological information. The expected result is Negative.  Fact Sheet for Patients:  EntrepreneurPulse.com.au  Fact Sheet for Healthcare Providers:  IncredibleEmployment.be  This test is no t yet approved or cleared by the [place] FDA and  has been authorized for detection and/or diagnosis of SARS-CoV-2 by FDA under an Emergency Use Authorization (EUA). This EUA will remain  in effect (meaning this test can be used) for the duration of the COVID-19 declaration under Section 564(b)(1) of the Act, 21 U.S.C.section 360bbb-3(b)(1), unless the authorization is terminated  or revoked sooner.       Influenza A by PCR NEGATIVE NEGATIVE Final   Influenza B by PCR NEGATIVE NEGATIVE Final    Comment: (NOTE) The Xpert Xpress SARS-CoV-2/FLU/RSV plus assay is intended as an aid in the diagnosis of influenza from Nasopharyngeal swab specimens and should not be used as a sole basis for treatment. Nasal washings and aspirates are unacceptable for Xpert Xpress SARS-CoV-2/FLU/RSV testing.  Fact Sheet for  Patients: EntrepreneurPulse.com.au  Fact Sheet for Healthcare Providers: IncredibleEmployment.be  This test is not yet approved or cleared by the [place] FDA and has been authorized for detection and/or diagnosis of SARS-CoV-2 by FDA under an Emergency Use Authorization (EUA). This EUA will remain in effect (meaning this test can be used) for the duration of the COVID-19 declaration under Section 564(b)(1) of the Act, 21 U.S.C. section 360bbb-3(b)(1), unless the authorization is terminated or revoked.  Performed at [facility], [place] [address] [place], [place] [address]      Radiology Studies:  No results found.   Scheduled Meds:    (feeding supplement) PROSource Plus  30 mL Oral BID BM   cephALEXin  250 mg Oral QHS   docusate sodium  100 mg Oral BID   feeding supplement (GLUCERNA SHAKE)  237 mL Oral BID BM   FLUoxetine  40 mg Oral QPM   insulin aspart  0-20 Units Subcutaneous TID WC   insulin aspart  0-5 Units Subcutaneous QHS   insulin aspart  3 Units Subcutaneous TID WC   insulin glargine-yfgn  50 Units Subcutaneous QHS   methocarbamol  1,000 mg Oral BID   multivitamin with minerals  1 tablet Oral Daily   pantoprazole  40 mg Oral Daily   pioglitazone  45 mg Oral Daily   polyethylene glycol  17 g Oral Daily   tamsulosin  0.4 mg Oral Daily    Continuous Infusions:       LOS: 6 days     Vernell Leep, MD, [place], [facility]. [facility]    To contact the attending provider between 7A-7P or the covering provider during after hours 7P-7A, please log into the web site [email or web address] and access using universal [place] password for that web site. If you do not have the password, please call the hospital operator.  06/18/2021, 4:24 PM

## 2021-06-18 NOTE — Progress Notes (Signed)
    OVERNIGHT PROGRESS REPORT  Notified by RN for patient concern over his current CBG.  Patient is awake and oriented x 4 and adamant to cover this CBG now. He is aware that it is altering his schedule and refuses to wait until his scheduled Sliding scale time.  Therefore: A CBG fingerstick was completed to verify the current level  A dose concurrent with his current ordered SSI is entered for "Once"      Gershon Cull MSNA MSN [place] Nurse Practitioner [place]

## 2021-06-19 DIAGNOSIS — M25462 Effusion, left knee: Secondary | ICD-10-CM | POA: Diagnosis not present

## 2021-06-19 DIAGNOSIS — N179 Acute kidney failure, unspecified: Secondary | ICD-10-CM | POA: Diagnosis not present

## 2021-06-19 LAB — PROTIME-INR
INR: 1.3 — ABNORMAL HIGH (ref 0.8–1.2)
Prothrombin Time: 15.9 seconds — ABNORMAL HIGH (ref 11.4–15.2)

## 2021-06-19 LAB — CBC
HCT: 30 % — ABNORMAL LOW (ref 39.0–52.0)
Hemoglobin: 9.2 g/dL — ABNORMAL LOW (ref 13.0–17.0)
MCH: 28.9 pg (ref 26.0–34.0)
MCHC: 30.7 g/dL (ref 30.0–36.0)
MCV: 94.3 fL (ref 80.0–100.0)
Platelets: 490 10*3/uL — ABNORMAL HIGH (ref 150–400)
RBC: 3.18 MIL/uL — ABNORMAL LOW (ref 4.22–5.81)
RDW: 15.7 % — ABNORMAL HIGH (ref 11.5–15.5)
WBC: 8.9 10*3/uL (ref 4.0–10.5)
nRBC: 0.7 % — ABNORMAL HIGH (ref 0.0–0.2)

## 2021-06-19 LAB — GLUCOSE, CAPILLARY
Glucose-Capillary: 189 mg/dL — ABNORMAL HIGH (ref 70–99)
Glucose-Capillary: 166 mg/dL — ABNORMAL HIGH (ref 70–99)
Glucose-Capillary: 150 mg/dL — ABNORMAL HIGH (ref 70–99)
Glucose-Capillary: 137 mg/dL — ABNORMAL HIGH (ref 70–99)
Glucose-Capillary: 146 mg/dL — ABNORMAL HIGH (ref 70–99)

## 2021-06-19 LAB — BASIC METABOLIC PANEL
Anion gap: 10 (ref 5–15)
BUN: 60 mg/dL — ABNORMAL HIGH (ref 8–23)
CO2: 22 mmol/L (ref 22–32)
Calcium: 8.5 mg/dL — ABNORMAL LOW (ref 8.9–10.3)
Chloride: 104 mmol/L (ref 98–111)
Creatinine, Ser: 1.35 mg/dL — ABNORMAL HIGH (ref 0.61–1.24)
GFR, Estimated: 59 mL/min — ABNORMAL LOW (ref >60)
Glucose, Bld: 186 mg/dL — ABNORMAL HIGH (ref 70–99)
Potassium: 4.8 mmol/L (ref 3.5–5.1)
Sodium: 136 mmol/L (ref 135–145)

## 2021-06-19 LAB — HEMOGLOBIN A1C
Hgb A1c MFr Bld: 6.1 % — ABNORMAL HIGH (ref 4.8–5.6)
Mean Plasma Glucose: 128 mg/dL

## 2021-06-19 MED ORDER — HEPARIN (PORCINE) 25000 UT/250ML-% IV SOLN
2200.0000 [IU]/h | INTRAVENOUS | Status: DC
  Administered 2021-06-19: 2200 [IU]/h via INTRAVENOUS
  Filled 2021-06-19: qty 250

## 2021-06-19 MED ORDER — WARFARIN - PHARMACIST DOSING INPATIENT: Freq: Every day | Status: DC

## 2021-06-19 MED ORDER — WARFARIN SODIUM 5 MG PO TABS
5.0000 mg | ORAL_TABLET | Freq: Once | ORAL | Status: AC
  Administered 2021-06-19: 5 mg via ORAL
  Filled 2021-06-19: qty 1

## 2021-06-19 NOTE — Progress Notes (Signed)
PROGRESS NOTE   Joseph [name].  RXV:[id number]    DOB: May 30, 1956    DOA: 06/11/2021  PCP: Donald Prose, MD   I have briefly reviewed patients previous medical records in [facility].  Chief Complaint  Patient presents with   Leg Swelling   Leg Pain    left    Brief Narrative:   Joseph Nuon. is a 66 y.o. male with PMH morbid obesity, anxiety/depression, hx left knee DVT, PE (on chronic Coumadin), GERD, HTN, IDA, nonspecific myalgias/myositis, OSA on CPAP, PVD, polyneuropathy, DMII, HLD, multiple falls who presented to the ER after another fall.    He was initially seen in the ER on 06/08/2021 after a fall at home.  He had fallen getting out of the shower.  He underwent imaging of CT C-spine, CT head, hip/pelvis x-rays, and left knee x-ray.  There were no acute findings.  He has underlying cervical spinal stenosis but no other acute findings were seen on imaging and he was discharged home. Per his daughter, he essentially was resting in bed from ER discharge until 9/15 when he had attempted to get out of bed and again had another fall.  Appears that his left knee may have been dislocated from this fall and was replaced back in socket by EMS.  In the ER he then had another fall out of bed with left knee dislocation.  Ortho was paged and performed reduction under conscious sedation.   He again underwent multiple imaging studies in the ER on 9/15. No visible acute fractures but body habitus does limit some of the studies. His CT A/P was motion degraded and was intended to evaluate for RP bleed. Of note, BP remained stable/normal and Hgb noted to be lower than 9/12 level but imaging studies do show a left knee effusion which is a presumed large hematoma due to his supratherapeutic INR on admission which was reversed some with Vit K.    He was admitted for further pain control and monitoring of left leg for any development of compartment syndrome and further evaluation as needed.     Assessment & Plan:  Principal Problem:   Left knee dislocation, initial encounter Active Problems:   Pure hypercholesterolemia   Essential hypertension, benign   OSA (obstructive sleep apnea)   Hx pulmonary embolism   Esophageal reflux   Diabetes mellitus type 2, uncontrolled (HCC)   Diabetic neuropathy (HCC)   Acute renal failure superimposed on stage 3b chronic kidney disease (HCC)   Normocytic anemia   Class 3 obesity   Aortic atherosclerosis (HCC)   Coronary atherosclerosis   Effusion of left knee   Left knee dislocation   Left knee dislocation, initial encounter - s/p fall at home x 2 this week and again in ER on 9/15 - xray shows fracture fragments as well (presumed from proximal tibia per xray read) -As per extensive communication with Dr. Erlinda Hong, orthopedics on 9/20: No weightbearing to LLE, no ROM, knee immobilizer at all times.  He indicated that the knee is very unstable.   -PT on board to mobilize patient as best as possible with restrictions per orthopedics follow-up 9/20. - Declined by CIR as not appropriate.  Difficult disposition due to very morbid obesity, nonweightbearing on left lower extremity -Examined left leg wounds on 9/22, pictures as below. -Clinically left leg findings no longer consistent with infection i.e. cellulitis and could consider stopping Keflex - eventually needs MRI knee once more stable   Effusion of  left knee - concerned for large hematoma given amount of bruising and swelling with INR 6.2 on admission (treated with total of 5 mg Vit K) -INR responded well to vitamin K.  Coumadin was held for several days. -No clinical worsening.  Improving. -No surgical intervention at this time per Ortho   Normocytic anemia - likely mixed from ACD and hematoma - baseline Hgb around 12 g/dL - admitted with Hgb 9.9 g/dL with large LLE bruising and presumed hematoma/effusion - now is s/p 2 units PRBC total for admission and 1 unit FFP - Hgb stable in  the mid 8 g range; bruising on knee improving - CBC daily    Acute renal failure superimposed on stage 3b chronic kidney disease ([place]) - patient has history of CKD3a. Baseline creat ~ 1.4 -1.5, eGFR 51 -56 - may have developed an ATN from hypoperfusion during blood loss - too difficult to obtain renal u/s at this time -Briefly on IV fluids.  Now discontinued. -Creatinine starting to downtrend over the last 3 days (2.87 > 2.57 > 1.93 >1.6 >1.52 >1.35) - continue holding BP meds - Repeat BMP in a.m.  Mild hyperkalemia Resolved.    Hx pulmonary embolism - on chronic coumadin at home, currently was on hold in setting of supratherapeutic INR and L knee effusion/hematoma -Communicated with Dr.Xu, orthopedics on 9/23 was okay to resume Coumadin. - As per discussion with patient, he reports history of DVT/PE and assigned to lifelong anticoagulation due to body habitus and relative sedentary lifestyle. - Initiated IV heparin bridging with Coumadin per pharmacy.   Coronary atherosclerosis - no CP - holding statin.   Class 3 obesity - Complicates overall prognosis and care - Body mass index is 60.69 kg/m.   Diabetic neuropathy ([place]) - continue current pain regimen    Diabetes mellitus type 2, uncontrolled ([place]) - continue SSI and CBG monitoring.  Reasonable control. - continue Lantus and metformin  This was reasonably controlled until evening of 9/21 and then worsened overnight, patient reports due to high calorie/carb diet for dinner.  Recommended cutting back on the same. - As discussed with patient, might consider increasing Lantus, adjusting other insulins.  For now he is agreeable to adding low-dose NovoLog meal cover and adding SSI bedtime scale.  Monitor closely. - Better controlled today.   Esophageal reflux - continue PPI   OSA (obstructive sleep apnea) - continue qhs CPAP   Essential hypertension, benign - continue current regimen    Pure hypercholesterolemia - hold  statin for now    Constipation Continue bowel regimen.  No BM yet but no concerning symptoms.  Body mass index is 60.69 kg/m.  Nutritional Status Nutrition Problem: Inadequate oral intake Etiology: decreased appetite Signs/Symptoms: per patient/family report Interventions: Glucerna shake, MVI, Prostat  Pressure Ulcer:     DVT prophylaxis: SCDs Start: 06/11/21 2153     Code Status: Full Code Family Communication: None at bedside today Disposition:  Status is: Inpatient  Remains inpatient appropriate because:Inpatient level of care appropriate due to severity of illness  Dispo: The patient is from:  Home              Anticipated d/c is to:  TBD              Patient currently is not medically stable to d/c.   Difficult to place patient Yes.  May be DTP due to morbid obesity, nonweightbearing on left lower extremity etc.        Consultants:   Orthopedics  Procedures:   Left knee reduction, 06/11/21  Antimicrobials:    Anti-infectives (From admission, onward)    Start     Dose/Rate Route Frequency Ordered Stop   06/11/21 2230  cephALEXin (KEFLEX) capsule 250 mg        250 mg Oral Daily at bedtime 06/11/21 2221           Subjective:  Passing flatus.  No BM yet.  However denies any concerning symptoms and is not worried.  Discussed risks and benefits of holding Coumadin further versus initiating-agreeable to starting.  Objective:   Vitals:   06/18/21 1127 06/18/21 2038 06/19/21 0406 06/19/21 1213  BP: (!) 162/47 (!) 177/41 (!) 140/39 (!) 168/52  Pulse: 76 86 78 78  Resp: (!) 22 18 18 18   Temp: 98 F (36.7 C) 98.7 F (37.1 C) 98.2 F (36.8 C) 98.3 F (36.8 C)  TempSrc: Oral Oral Oral Oral  SpO2: 90% 92% 97% 92%  Weight:      Height:        General exam: Middle-age male, moderately built and morbidly obese lying comfortably supine in bed without distress. Respiratory system: Clear to auscultation.  No increased work of breathing. Cardiovascular  system: S1 & S2 heard, RRR. No JVD, murmurs, rubs, gallops or clicks. No pedal edema.  Telemetry personally reviewed: Sinus rhythm-discontinued 9/22 and patient wanted desperately removed as well. Gastrointestinal system: Abdomen is nondistended, soft and nontender. No organomegaly or masses felt. Normal bowel sounds heard. Central nervous system: Alert and oriented. No focal neurological deficits. Extremities: Symmetric 5 x 5 power except for left lower extremity which was not assessed for power.  Left lower extremity is in immobilizer with dressing over wounds underneath.  He has bilateral great toe amputations. Skin: No rashes, lesions or ulcers Psychiatry: Judgement and insight appear normal. Mood & affect appropriate.   Pictures below from 06/18/2021, appears stable.  Leg was examined by me personally on 06/18/2021 when nurses took down his dressing.      Data Reviewed:   I have personally reviewed following labs and imaging studies   CBC: Recent Labs  Lab 06/15/21 0256 06/16/21 0339 06/17/21 0325 06/19/21 0334  WBC 7.7 7.3 8.0 8.9  NEUTROABS 4.8 4.5 5.1  --   HGB 8.7* 8.5* 8.7* 9.2*  HCT 27.6* 27.1* 28.0* 30.0*  MCV 92.3 92.8 93.0 94.3  PLT 283 318 356 490*    Basic Metabolic Panel: Recent Labs  Lab 06/15/21 0256 06/16/21 0339 06/17/21 0325 06/18/21 0340 06/19/21 0334  NA 141 138 136 139 136  K 4.8 4.7 4.7 5.2* 4.8  CL 107 105 104 110 104  CO2 26 25 24 25 22   GLUCOSE 177* 143* 181* 268* 186*  BUN 74* 70* 70* 69* 60*  CREATININE 2.57* 1.93* 1.65* 1.52* 1.35*  CALCIUM 8.2* 7.9* 8.1* 8.4* 8.5*  MG 2.3 2.4 2.6*  --   --     Liver Function Tests: No results for input(s): AST, ALT, ALKPHOS, BILITOT, PROT, ALBUMIN in the last 168 hours.   CBG: Recent Labs  Lab 06/19/21 0722 06/19/21 1056 06/19/21 1558  GLUCAP 166* 150* 137*    Microbiology Studies:   Recent Results (from the past 240 hour(s))  Resp Panel by RT-PCR (Flu A&B, Covid) Nasopharyngeal Swab      Status: None   Collection Time: 06/11/21  5:09 PM   Specimen: Nasopharyngeal Swab; Nasopharyngeal(NP) swabs in vial transport medium  Result Value Ref Range Status   SARS Coronavirus 2 by RT PCR  NEGATIVE NEGATIVE Final    Comment: (NOTE) SARS-CoV-2 target nucleic acids are NOT DETECTED.  The SARS-CoV-2 RNA is generally detectable in upper respiratory specimens during the acute phase of infection. The lowest concentration of SARS-CoV-2 viral copies this assay can detect is 138 copies/mL. A negative result does not preclude SARS-Cov-2 infection and should not be used as the sole basis for treatment or other patient management decisions. A negative result may occur with  improper specimen collection/handling, submission of specimen other than nasopharyngeal swab, presence of viral mutation(s) within the areas targeted by this assay, and inadequate number of viral copies(<138 copies/mL). A negative result must be combined with clinical observations, patient history, and epidemiological information. The expected result is Negative.  Fact Sheet for Patients:  EntrepreneurPulse.com.au  Fact Sheet for Healthcare Providers:  IncredibleEmployment.be  This test is no t yet approved or cleared by the [place] FDA and  has been authorized for detection and/or diagnosis of SARS-CoV-2 by FDA under an Emergency Use Authorization (EUA). This EUA will remain  in effect (meaning this test can be used) for the duration of the COVID-19 declaration under Section 564(b)(1) of the Act, 21 U.S.C.section 360bbb-3(b)(1), unless the authorization is terminated  or revoked sooner.       Influenza A by PCR NEGATIVE NEGATIVE Final   Influenza B by PCR NEGATIVE NEGATIVE Final    Comment: (NOTE) The Xpert Xpress SARS-CoV-2/FLU/RSV plus assay is intended as an aid in the diagnosis of influenza from Nasopharyngeal swab specimens and should not be used as a sole basis  for treatment. Nasal washings and aspirates are unacceptable for Xpert Xpress SARS-CoV-2/FLU/RSV testing.  Fact Sheet for Patients: EntrepreneurPulse.com.au  Fact Sheet for Healthcare Providers: IncredibleEmployment.be  This test is not yet approved or cleared by the [place] FDA and has been authorized for detection and/or diagnosis of SARS-CoV-2 by FDA under an Emergency Use Authorization (EUA). This EUA will remain in effect (meaning this test can be used) for the duration of the COVID-19 declaration under Section 564(b)(1) of the Act, 21 U.S.C. section 360bbb-3(b)(1), unless the authorization is terminated or revoked.  Performed at [facility], [place] [address], [place], [place] [address]      Radiology Studies:  No results found.   Scheduled Meds:    (feeding supplement) PROSource Plus  30 mL Oral BID BM   cephALEXin  250 mg Oral QHS   docusate sodium  100 mg Oral BID   feeding supplement (GLUCERNA SHAKE)  237 mL Oral BID BM   FLUoxetine  40 mg Oral QPM   insulin aspart  0-20 Units Subcutaneous TID WC   insulin aspart  0-5 Units Subcutaneous QHS   insulin aspart  3 Units Subcutaneous TID WC   insulin glargine-yfgn  50 Units Subcutaneous QHS   methocarbamol  1,000 mg Oral BID   multivitamin with minerals  1 tablet Oral Daily   pantoprazole  40 mg Oral Daily   pioglitazone  45 mg Oral Daily   polyethylene glycol  17 g Oral Daily   tamsulosin  0.4 mg Oral Daily    Continuous Infusions:       LOS: 7 days     Vernell Leep, MD, FACP, [facility]. [facility]    To contact the attending provider between 7A-7P or the covering provider during after hours 7P-7A, please log into the web site [email or web address] and access using universal [place] password for that web site. If you do not have the password, please call  the hospital operator.  06/19/2021, 4:29 PM

## 2021-06-19 NOTE — TOC Progression Note (Signed)
Transition of Care (TOC) - Progression Note    Patient Details  Name: Joseph [name]. MRN: [id number] Date of Birth: [birth year]  Transition of Care [facility]) CM/SW Contact  Ross Ludwig, [place] Phone Number: 06/19/2021, 10:03 AM  Clinical Narrative:     CSW requested that CIR call patient's wife and explain to her why he is not able to be accepted to inpatient rehab.  CSW to continue working on placement.        Expected Discharge Plan and Services                                                 Social Determinants of Health (SDOH) Interventions    Readmission Risk Interventions No flowsheet data found.

## 2021-06-19 NOTE — Progress Notes (Signed)
ANTICOAGULATION CONSULT NOTE - Initial Consult  Pharmacy Consult for Heparin and Warfarin Indication: h/o pulmonary embolus  Allergies  Allergen Reactions   Tape     Peels skin on legs   Chlorhexidine Rash    Wipes    Patient Measurements: Height: 6\' 1"  (185.4 cm) Weight: (!) 208.7 kg (460 lb) IBW/kg (Calculated) : 79.9 Heparin Dosing Weight: 132.5 kg  Vital Signs: Temp: 98.3 F (36.8 C) (09/23 1213) Temp Source: Oral (09/23 1213) BP: 168/52 (09/23 1213) Pulse Rate: 78 (09/23 1213)  Labs: Recent Labs    06/17/21 0325 06/18/21 0340 06/19/21 0334  HGB 8.7*  --  9.2*  HCT 28.0*  --  30.0*  PLT 356  --  490*  LABPROT 16.8* 15.8* 15.9*  INR 1.4* 1.3* 1.3*  CREATININE 1.65* 1.52* 1.35*    Estimated Creatinine Clearance: 102.7 mL/min (A) (by C-G formula based on SCr of 1.35 mg/dL (H)).   Medical History: Past Medical History:  Diagnosis Date   Anxiety state, unspecified    Aortic atherosclerosis ([place]) 06/12/2021   Depression    DVT (deep venous thrombosis) ([place]) 2004   Left knee   Edema    Esophageal reflux    occ   Essential hypertension, benign    Essential hypertension, benign    Falls frequently    History of iron deficiency anemia    Morbid obesity (HCC)    Myalgia and myositis, unspecified    BACK AND LEGS   OSA on CPAP    Peripheral vascular disease, unspecified (HCC)    Personal history of PE (pulmonary embolism) 2004   Polyneuropathy in diabetes(357.2)    Poor venous access    PT STATES DIFFICULT TO DRAW BLOOD FROM HIS VEINS   Pure hypercholesterolemia    Shortness of breath    DUE TO WEIGHT AND LOSS OF MOBILITY   Type II or unspecified type diabetes mellitus with neurological manifestations, not stated as uncontrolled(250.60)    Type II or unspecified type diabetes mellitus without mention of complication, not stated as uncontrolled    Type II or unspecified type diabetes mellitus without mention of complication, uncontrolled    Varicose  veins of lower extremities with inflammation    Wears glasses    Wound infection after surgery    required hospitalization    Medications:  PTA warfarin dosing:  Take 5 mg in the evening on Sun, Mon, Wed, and Fri. Take 7.5 mg in the evening on Tue, Thur, and Sat Tennis Must     Assessment: 65 y/o M on lifelong anticoagulation with warfarin for h/o PE, body habitus, and sedentary lifestyle admitted with left knee dislocation and effusion concerning for hematoma. Patient received multiple doses of vitamin K, was transfused, and has been off warfarin for several days. Pharmacy consulted to resume warfarin with heparin bridging.   Goal of Therapy:  INR 2-3 Heparin level 0.3-0.7 units/ml Monitor platelets by anticoagulation protocol: Yes   Plan:  Initiate heparin infusion at 2200 units/hr Warfarin 5 mg x 1 HL 6 hours after initiating infusion Daily CBC and PT/INR  Ulice Dash D 06/19/2021,5:20 PM

## 2021-06-19 NOTE — Progress Notes (Signed)
[facility] is pts home unit. He prefer self placement no assistance needed.

## 2021-06-19 NOTE — Progress Notes (Signed)
Physical Therapy Treatment Patient Details Name: Joseph [name]. MRN: [id number] DOB: 1955/10/20 Today's Date: 06/19/2021   History of Present Illness Joseph [name] is a 65 yo male who presnets with increased LLE swelling after recent fall at home. 9/15 pt fell when getting off x-ray table at hospital. X-rays showed a knee dislocation which was confirmed on CT scan as a posterior lateral dislocation; L knee reduced and L KI placed. Lumbar and pelvic xrays negative. PMH: DVT, HTN, falls, morbid obesity, PVD, PE, diabetes, bil hallux amputation 04/03/20    PT Comments    Performed bed level TE's only this session. Spouse asking about [facility] and stated she was awaiting a "Final" decision.     Recommendations for follow up therapy are one component of a multi-disciplinary discharge planning process, led by the attending physician.  Recommendations may be updated based on patient status, additional functional criteria and insurance authorization.  Follow Up Recommendations  CIR     Equipment Recommendations       Recommendations for Other Services       Precautions / Restrictions Precautions Precautions: Fall Precaution Comments: NO knee ROM Required Braces or Orthoses: Knee Immobilizer - Left Knee Immobilizer - Left: On at all times Restrictions Weight Bearing Restrictions: Yes LLE Weight Bearing: Non weight bearing           Cognition Arousal/Alertness: Awake/alert Behavior During Therapy: WFL for tasks assessed/performed Overall Cognitive Status: Within Functional Limits for tasks assessed                                 General Comments: AxO x 3 very pleasant man, spouse also present      Exercises  AAROM L LE knee presses, ABD/ADD  AROM R LE HS, SAQ's, knee presses, SLR and pillow squeezes    General Comments        Pertinent Vitals/Pain Pain Assessment: Faces Faces Pain Scale: Hurts little more Pain Location: Left knee Pain Descriptors /  Indicators: Grimacing;Aching;Sore;Discomfort;Guarding Pain Intervention(s): Monitored during session;Premedicated before session;Repositioned    Home Living                      Prior Function            PT Goals (current goals can now be found in the care plan section) Progress towards PT goals: Progressing toward goals    Frequency    Min 3X/week      PT Plan Current plan remains appropriate    Co-evaluation              AM-PAC PT "6 Clicks" Mobility   Outcome Measure  Help needed turning from your back to your side while in a flat bed without using bedrails?: Total Help needed moving from lying on your back to sitting on the side of a flat bed without using bedrails?: Total Help needed moving to and from a bed to a chair (including a wheelchair)?: Total Help needed standing up from a chair using your arms (e.g., wheelchair or bedside chair)?: Total Help needed to walk in hospital room?: Total Help needed climbing 3-5 steps with a railing? : Total 6 Click Score: 6    End of Session   Activity Tolerance: No increased pain Patient left: in bed;with call bell/phone within reach;with family/visitor present   PT Visit Diagnosis: Other abnormalities of gait and mobility (R26.89);Muscle weakness (generalized) (M62.81);History of falling (  Z91.81)     Time: 3338-3291 PT Time Calculation (min) (ACUTE ONLY): 19 min  Charges:  $Therapeutic Exercise: 8-22 mins                     {Tris Howell  PTA Acute  Rehabilitation Services Pager      [phone] Office      [phone]

## 2021-06-20 DIAGNOSIS — M25462 Effusion, left knee: Secondary | ICD-10-CM | POA: Diagnosis not present

## 2021-06-20 DIAGNOSIS — N179 Acute kidney failure, unspecified: Secondary | ICD-10-CM | POA: Diagnosis not present

## 2021-06-20 LAB — GLUCOSE, CAPILLARY
Glucose-Capillary: 134 mg/dL — ABNORMAL HIGH (ref 70–99)
Glucose-Capillary: 118 mg/dL — ABNORMAL HIGH (ref 70–99)
Glucose-Capillary: 175 mg/dL — ABNORMAL HIGH (ref 70–99)
Glucose-Capillary: 143 mg/dL — ABNORMAL HIGH (ref 70–99)
Glucose-Capillary: 128 mg/dL — ABNORMAL HIGH (ref 70–99)

## 2021-06-20 LAB — PROTIME-INR
INR: 1.2 (ref 0.8–1.2)
Prothrombin Time: 15.4 seconds — ABNORMAL HIGH (ref 11.4–15.2)

## 2021-06-20 LAB — CBC
HCT: 28.5 % — ABNORMAL LOW (ref 39.0–52.0)
HCT: 31 % — ABNORMAL LOW (ref 39.0–52.0)
Hemoglobin: 8.8 g/dL — ABNORMAL LOW (ref 13.0–17.0)
Hemoglobin: 9.4 g/dL — ABNORMAL LOW (ref 13.0–17.0)
MCH: 28.9 pg (ref 26.0–34.0)
MCH: 28.9 pg (ref 26.0–34.0)
MCHC: 30.3 g/dL (ref 30.0–36.0)
MCHC: 30.9 g/dL (ref 30.0–36.0)
MCV: 93.4 fL (ref 80.0–100.0)
MCV: 95.4 fL (ref 80.0–100.0)
Platelets: 505 10*3/uL — ABNORMAL HIGH (ref 150–400)
Platelets: 537 10*3/uL — ABNORMAL HIGH (ref 150–400)
RBC: 3.05 MIL/uL — ABNORMAL LOW (ref 4.22–5.81)
RBC: 3.25 MIL/uL — ABNORMAL LOW (ref 4.22–5.81)
RDW: 15.9 % — ABNORMAL HIGH (ref 11.5–15.5)
RDW: 16.1 % — ABNORMAL HIGH (ref 11.5–15.5)
WBC: 8.2 10*3/uL (ref 4.0–10.5)
WBC: 8.6 10*3/uL (ref 4.0–10.5)
nRBC: 0.6 % — ABNORMAL HIGH (ref 0.0–0.2)
nRBC: 0.7 % — ABNORMAL HIGH (ref 0.0–0.2)

## 2021-06-20 LAB — HEPARIN LEVEL (UNFRACTIONATED)
Heparin Unfractionated: 0.12 IU/mL — ABNORMAL LOW (ref 0.30–0.70)
Heparin Unfractionated: 0.42 IU/mL (ref 0.30–0.70)
Heparin Unfractionated: 0.52 IU/mL (ref 0.30–0.70)

## 2021-06-20 LAB — BASIC METABOLIC PANEL
Anion gap: 7 (ref 5–15)
BUN: 50 mg/dL — ABNORMAL HIGH (ref 8–23)
CO2: 25 mmol/L (ref 22–32)
Calcium: 8.5 mg/dL — ABNORMAL LOW (ref 8.9–10.3)
Chloride: 104 mmol/L (ref 98–111)
Creatinine, Ser: 1.32 mg/dL — ABNORMAL HIGH (ref 0.61–1.24)
GFR, Estimated: >60 mL/min (ref >60)
Glucose, Bld: 145 mg/dL — ABNORMAL HIGH (ref 70–99)
Potassium: 5 mmol/L (ref 3.5–5.1)
Sodium: 136 mmol/L (ref 135–145)

## 2021-06-20 MED ORDER — HEPARIN (PORCINE) 25000 UT/250ML-% IV SOLN
2600.0000 [IU]/h | INTRAVENOUS | Status: DC
  Administered 2021-06-20 – 2021-06-23 (×9): 2700 [IU]/h via INTRAVENOUS
  Administered 2021-06-23 – 2021-06-26 (×7): 2600 [IU]/h via INTRAVENOUS
  Filled 2021-06-20 (×20): qty 250

## 2021-06-20 MED ORDER — SENNA 8.6 MG PO TABS
1.0000 | ORAL_TABLET | Freq: Every day | ORAL | Status: DC
  Administered 2021-06-20 – 2021-07-28 (×28): 8.6 mg via ORAL
  Filled 2021-06-20 (×31): qty 1

## 2021-06-20 MED ORDER — WARFARIN SODIUM 5 MG PO TABS
7.5000 mg | ORAL_TABLET | Freq: Once | ORAL | Status: AC
  Administered 2021-06-20: 7.5 mg via ORAL
  Filled 2021-06-20: qty 1

## 2021-06-20 MED ORDER — POLYETHYLENE GLYCOL 3350 17 G PO PACK
17.0000 g | PACK | Freq: Two times a day (BID) | ORAL | Status: DC
  Administered 2021-06-26 – 2021-07-28 (×24): 17 g via ORAL
  Filled 2021-06-20 (×50): qty 1

## 2021-06-20 MED ORDER — HEPARIN BOLUS VIA INFUSION
4000.0000 [IU] | Freq: Once | INTRAVENOUS | Status: AC
  Administered 2021-06-20: 4000 [IU] via INTRAVENOUS
  Filled 2021-06-20: qty 4000

## 2021-06-20 NOTE — Progress Notes (Signed)
[place] for Heparin and Warfarin Indication: h/o pulmonary embolus  Allergies  Allergen Reactions   Tape     Peels skin on legs   Chlorhexidine Rash    Wipes    Patient Measurements: Height: 6\' 1"  (185.4 cm) Weight: (!) 208.7 kg (460 lb) IBW/kg (Calculated) : 79.9 Heparin Dosing Weight: 132.5 kg  Vital Signs: Temp: 99 F (37.2 C) (09/23 2057) Temp Source: Oral (09/23 2057) BP: 158/50 (09/23 2057) Pulse Rate: 75 (09/23 2057)  Labs: Recent Labs    06/17/21 0325 06/18/21 0340 06/19/21 0334 06/20/21 0014  HGB 8.7*  --  9.2* 8.8*  HCT 28.0*  --  30.0* 28.5*  PLT 356  --  490* 505*  LABPROT 16.8* 15.8* 15.9* 15.4*  INR 1.4* 1.3* 1.3* 1.2  HEPARINUNFRC  --   --   --  0.12*  CREATININE 1.65* 1.52* 1.35*  --      Estimated Creatinine Clearance: 102.7 mL/min (A) (by C-G formula based on SCr of 1.35 mg/dL (H)).   Medical History: Past Medical History:  Diagnosis Date   Anxiety state, unspecified    Aortic atherosclerosis ([place]) 06/12/2021   Depression    DVT (deep venous thrombosis) ([place]) 2004   Left knee   Edema    Esophageal reflux    occ   Essential hypertension, benign    Essential hypertension, benign    Falls frequently    History of iron deficiency anemia    Morbid obesity (HCC)    Myalgia and myositis, unspecified    BACK AND LEGS   OSA on CPAP    Peripheral vascular disease, unspecified (HCC)    Personal history of PE (pulmonary embolism) 2004   Polyneuropathy in diabetes(357.2)    Poor venous access    PT STATES DIFFICULT TO DRAW BLOOD FROM HIS VEINS   Pure hypercholesterolemia    Shortness of breath    DUE TO WEIGHT AND LOSS OF MOBILITY   Type II or unspecified type diabetes mellitus with neurological manifestations, not stated as uncontrolled(250.60)    Type II or unspecified type diabetes mellitus without mention of complication, not stated as uncontrolled    Type II or unspecified type diabetes mellitus  without mention of complication, uncontrolled    Varicose veins of lower extremities with inflammation    Wears glasses    Wound infection after surgery    required hospitalization    Medications:  PTA warfarin dosing:  Take 5 mg in the evening on Sun, Mon, Wed, and Fri. Take 7.5 mg in the evening on Tue, Thur, and Sat Tennis Must     Assessment: 65 y/o M on lifelong anticoagulation with warfarin for h/o PE, body habitus, and sedentary lifestyle admitted with left knee dislocation and effusion concerning for hematoma. Patient received multiple doses of vitamin K, was transfused, and has been off warfarin for several days. Pharmacy consulted to resume warfarin with heparin bridging.   06/20/2021 First heparin level below goal at 0.12 after heparin drip started at 2200 units/hr with no bolus No issues/ bleeding reported  Goal of Therapy:  INR 2-3 Heparin level 0.3-0.7 units/ml Monitor platelets by anticoagulation protocol: Yes   Plan:  Heparin bolus 4000 units IV x 1 Increase heparin infusion to 2700 units/hr Warfarin 5 mg x 1 given earlier at 1800 HL 6 hours after bolus & rate increase Daily CBC and PT/INR  Eudelia Bunch, Pharm.D 06/20/2021 12:47 AM

## 2021-06-20 NOTE — Progress Notes (Addendum)
[place] for Warfarin with heparin bridge Indication: h/o pulmonary embolus  Allergies  Allergen Reactions   Tape     Peels skin on legs   Chlorhexidine Rash    Wipes    Patient Measurements: Height: 6\' 1"  (185.4 cm) Weight: (!) 208.7 kg (460 lb) IBW/kg (Calculated) : 79.9 Heparin Dosing Weight: 132.5 kg  Vital Signs: Temp: 97.7 F (36.5 C) (09/24 0521) Temp Source: Oral (09/24 0521) BP: 146/52 (09/24 0521) Pulse Rate: 77 (09/24 0521)  Labs: Recent Labs    06/18/21 0340 06/19/21 0334 06/19/21 0334 06/20/21 0014 06/20/21 0733  HGB  --  9.2*   < > 8.8* 9.4*  HCT  --  30.0*  --  28.5* 31.0*  PLT  --  490*  --  505* 537*  LABPROT 15.8* 15.9*  --  15.4*  --   INR 1.3* 1.3*  --  1.2  --   HEPARINUNFRC  --   --   --  0.12* 0.52  CREATININE 1.52* 1.35*  --  1.32*  --    < > = values in this interval not displayed.     Estimated Creatinine Clearance: 105.1 mL/min (A) (by C-G formula based on SCr of 1.32 mg/dL (H)).   Medical History: Past Medical History:  Diagnosis Date   Anxiety state, unspecified    Aortic atherosclerosis ([place]) 06/12/2021   Depression    DVT (deep venous thrombosis) ([employer]) 2004   Left knee   Edema    Esophageal reflux    occ   Essential hypertension, benign    Essential hypertension, benign    Falls frequently    History of iron deficiency anemia    Morbid obesity (HCC)    Myalgia and myositis, unspecified    BACK AND LEGS   OSA on CPAP    Peripheral vascular disease, unspecified (HCC)    Personal history of PE (pulmonary embolism) 2004   Polyneuropathy in diabetes(357.2)    Poor venous access    PT STATES DIFFICULT TO DRAW BLOOD FROM HIS VEINS   Pure hypercholesterolemia    Shortness of breath    DUE TO WEIGHT AND LOSS OF MOBILITY   Type II or unspecified type diabetes mellitus with neurological manifestations, not stated as uncontrolled(250.60)    Type II or unspecified type diabetes mellitus  without mention of complication, not stated as uncontrolled    Type II or unspecified type diabetes mellitus without mention of complication, uncontrolled    Varicose veins of lower extremities with inflammation    Wears glasses    Wound infection after surgery    required hospitalization    Medications:  PTA warfarin dosing:  Take 5 mg in the evening on Sun, Mon, Wed, and Fri. Take 7.5 mg in the evening on Tue, Thur, and Sat Tenn[employer] Must   INR was SUPRAtherapeutic (6.2) on admission  Assessment: 65 y/o M on lifelong anticoagulation with warfarin for h/o PE, body habitus, and sedentary lifestyle admitted with left knee dislocation and effusion concerning for hematoma. On admission - warfarin was held, vitamin K was given for supratherapeutic INR, and pt was transfused. Ortho following.   Pharmacy consulted to resume warfarin on 9/23 with heparin bridge while INR subtherapeutic.   Significant Events: -Warfarin held from 9/15 - 9/22 -Vitamin K given on 9/15, 9/16, and 9/17  Today, 06/20/21 INR = 1.2 is subtherapeutic as expected after holding warfarin and reversing with vitamin K. Anticipate it taking 3-5 days to see reflection  of resuming warfarin in INR HL = 0.52 is therapeutic on heparin infusion of 2700 units/hr CBC: Hgb low but stable; Plt elevated Confirmed with RN that heparin infusing at correct rate. RN reports that patient has leg wounds/ulcers and there is some bloody drainage seeping through dressing. Discussed with MD - aware and monitoring.  Goal of Therapy:  INR 2-3 Heparin level 0.3-0.7 units/ml Monitor platelets by anticoagulation protocol: Yes   Plan:  Continue heparin infusion at current rate of 2700 units/hr Check confirmatory 6 hour HL Warfarin 7.5 mg PO once today HL, CBC, INR daily Monitor for signs of bleeding  Lenis Noon, PharmD 06/20/21 9:47 AM   Addendum: Evening Anticoagulation Follow Up  Assessment: Confirmatory HL = 0.42 remains  therapeutic on heparin infusion of 2700 units/hr Confirmed with RN that heparin infusing at correct rate. Dressing was changed during this shift - pt still has bruising/blister on leg but no active overt bleeding.   Plan: Continue heparin at current rate of 2700 units/hr Recheck HL, CBC, INR with AM labs tomorrow.   Lenis Noon, PharmD 06/20/21 3:15 PM

## 2021-06-20 NOTE — Progress Notes (Signed)
Occupational Therapy Treatment Patient Details Name: Joseph [name]. MRN: [id number] DOB: Feb 06, 1956 Today's Date: 06/20/2021   History of present illness Joseph [name] is a 65 yo male who presnets with increased LLE swelling after recent fall at home. 9/15 pt fell when getting off x-ray table at hospital. X-rays showed a knee dislocation which was confirmed on CT scan as a posterior lateral dislocation; L knee reduced and L KI placed. Lumbar and pelvic xrays negative. 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 Patient performed 20 reps of each exercise. Blue theraband tied to head board for tricep strengthening and loose blue band provided for other exercises. Patient tolerated well and appreciative. Recommended he perform at least once daily but twice if he could. Handout provided.   Recommendations for follow up therapy are one component of a multi-disciplinary discharge planning process, led by the attending physician.  Recommendations may be updated based on patient status, additional functional criteria and insurance authorization.    Follow Up Recommendations  SNF;CIR    Equipment Recommendations  Other (comment)    Recommendations for Other Services      Precautions / Restrictions Precautions Precautions: Fall Precaution Comments: NO knee ROM Required Braces or Orthoses: Knee Immobilizer - Left Knee Immobilizer - Left: On at all times Restrictions Weight Bearing Restrictions: Yes LLE Weight Bearing: Non weight bearing       Mobility Bed Mobility                    Transfers                      Balance                                           ADL either performed or assessed with clinical judgement   ADL                                               Vision Patient  Visual Report: No change from baseline     Perception     Praxis      Cognition Arousal/Alertness: Awake/alert Behavior During Therapy: WFL for tasks assessed/performed Overall Cognitive Status: Within Functional Limits for tasks assessed                                          Exercises Shoulder Exercises Shoulder Flexion: AROM;Right;Left;20 reps;Theraband;Supine Theraband Level (Shoulder Flexion): Level 4 (Blue) Shoulder External Rotation: AROM;Strengthening;Right;Left;20 reps;Theraband;Supine Theraband Level (Shoulder External Rotation): Level 4 (Blue) Elbow Flexion: Strengthening;Right;Left;20 reps;Theraband;Supine Theraband Level (Elbow Flexion): Level 4 (Blue) Elbow Extension: Right;Left;20 reps;Theraband;Supine Theraband Level (Elbow Extension): Level 4 (Blue)   Shoulder Instructions       General Comments      Pertinent Vitals/ Pain       Pain Assessment: No/denies pain  Home Living  Prior Functioning/Environment              Frequency  Min 2X/week        Progress Toward Goals  OT Goals(current goals can now be found in the care plan section)  Progress towards OT goals: Progressing toward goals  Acute Rehab OT Goals Patient Stated Goal: do whatever he can OT Goal Formulation: With patient Time For Goal Achievement: 07/02/21 Potential to Achieve Goals: Good  Plan Discharge plan remains appropriate    Co-evaluation          OT goals addressed during session: Strengthening/ROM      AM-PAC OT "6 Clicks" Daily Activity     Outcome Measure   Help from another person eating meals?: None Help from another person taking care of personal grooming?: A Little Help from another person toileting, which includes using toliet, bedpan, or urinal?: Total Help from another person bathing (including washing, rinsing, drying)?: A Lot Help from another person to put on and  taking off regular upper body clothing?: A Little Help from another person to put on and taking off regular lower body clothing?: Total 6 Click Score: 14    End of Session    OT Visit Diagnosis: Other abnormalities of gait and mobility (R26.89);Unsteadiness on feet (R26.81);Muscle weakness (generalized) (M62.81);Repeated falls (R29.6);Pain   Activity Tolerance Patient tolerated treatment well   Patient Left in bed;with call bell/phone within reach;with nursing/sitter in room   Nurse Communication  (okay to see)        Time: 1520-1535 OT Time Calculation (min): 15 min  Charges: OT General Charges $OT Visit: 1 Visit OT Treatments $Therapeutic Exercise: 8-22 mins  Derl Barrow, OTR/L [place]  Office [phone] Pager: [place] 06/20/2021, 3:40 PM

## 2021-06-20 NOTE — Progress Notes (Addendum)
PROGRESS NOTE   [name].  IRJ:[id number]    DOB: Nov 09, 1955    DOA: 06/11/2021  PCP: Donald Prose, MD   I have briefly reviewed patients previous [facility] in [facility].  Chief Complaint  Patient presents with   Leg Swelling   Leg Pain    left    Brief Narrative:   [name]. is a 65 y.o. male with PMH morbid obesity, anxiety/depression, hx left knee DVT, PE (on chronic Coumadin), GERD, HTN, IDA, nonspecific myalgias/myositis, OSA on CPAP, PVD, polyneuropathy, DMII, HLD, multiple falls who presented to the ER after another fall.    He was initially seen in the ER on 06/08/2021 after a fall at home.  He had fallen getting out of the shower. He underwent imaging of CT C-spine, CT head, hip/pelvis x-rays, and left knee x-ray.  There were no acute findings and he was discharged home. He had attempted to get out of bed and again had another fall. Appears that his left knee may have been dislocated from this fall and was replaced back in socket by EMS. In the ER he then had another fall out of bed with left knee dislocation. Ortho was paged and performed reduction under conscious sedation.   He again underwent multiple imaging studies in the ER on 9/15. No visible acute fractures but body habitus does limit some of the studies. His CT A/P was motion degraded and was intended to evaluate for RP bleed. Imaging studies do show a left knee effusion which is a presumed large hematoma due to his supratherapeutic INR on admission which was reversed some with Vit K.    He was admitted for further pain control and monitoring of left leg for any development of compartment syndrome and further evaluation as needed.  Currently stable pending SNF placement and IV heparin bridging to Coumadin.   Assessment & Plan:  Principal Problem:   Left knee dislocation, initial encounter Active Problems:   Pure hypercholesterolemia   Essential hypertension, benign   OSA (obstructive sleep  apnea)   Hx pulmonary embolism   Esophageal reflux   Diabetes mellitus type 2, uncontrolled (HCC)   Diabetic neuropathy (HCC)   Acute renal failure superimposed on stage 3b chronic kidney disease (HCC)   Normocytic anemia   Class 3 obesity   Aortic atherosclerosis (HCC)   Coronary atherosclerosis   Effusion of left knee   Left knee dislocation   Left knee dislocation - s/p fall at home x 2 week of admission and again in ER on 9/15 - xray shows fracture fragments as well (presumed from proximal tibia per xray read) -As per extensive communication with Dr. Erlinda Hong, orthopedics on 9/20: No weightbearing to LLE, no ROM, knee immobilizer at all times.  He indicated that the knee is very unstable and not a candidate for surgery or external fixation due to body habitus and leg wounds.   - PT and OT have directly communicated with Dr. Erlinda Hong to clarify his left lower extremity restrictions and what is doable and what does not. - Declined by CIR as not appropriate.  Difficult disposition due to very morbid obesity, nonweightbearing on left lower extremity -Examined left leg wounds on 9/22, pictures as below. -Clinically left leg findings no longer consistent with infection i.e. cellulitis.  As per pharmacy review, appears to be on chronic Keflex for urological indication. - Some blood oozing from superficial open wounds/ruptured blister-monitor closely while on IV heparin/Coumadin. - eventually needs MRI  knee once more stable   Effusion of left knee - concerned for large hematoma given amount of bruising and swelling with INR 6.2 on admission - Coumadin was held for several days and INR was reversed with vitamin K -No clinical worsening.  Improving. -No surgical intervention at this time per Ortho   Normocytic anemia - likely mixed from ACD and hematoma - baseline Hgb around 12 g/dL - admitted with Hgb 9.9 g/dL with large LLE bruising and presumed hematoma/effusion - now is s/p 2 units PRBC total  for admission and 1 unit FFP - Hgb stable in the mid 8 g range; bruising on knee improving -Follow CBC periodically.   Acute renal failure superimposed on stage 3b chronic kidney disease ([place]) - patient has history of CKD3a. Baseline creat ~ 1.4 -1.5, eGFR 51 -56 - may have developed an ATN from hypoperfusion during blood loss - too difficult to obtain renal u/s at this time -Briefly on IV fluids.  Now discontinued. -Creatinine has gradually improved and now at baseline.  Avoid nephrotoxic's and periodically monitor BMP - continue holding BP meds  Mild hyperkalemia -Resolved.    Hx pulmonary embolism - on chronic coumadin at home.  On hold on admission due to suspected hematoma of left knee, supratherapeutic INR which was reversed with vitamin K -Communicated with Dr.Xu, orthopedics on 9/23 was okay to resume Coumadin. - As per discussion with patient, he reports history of DVT/PE and assigned to lifelong anticoagulation due to body habitus and relative sedentary lifestyle. - Initiated IV heparin bridging with Coumadin per pharmacy.   Coronary atherosclerosis - no CP - holding statin.   Body mass index is 60.69 kg/m./Very morbid obesity - Complicates overall prognosis and care   Diabetic neuropathy ([place]) - continue current pain regimen.  Controlled.   Diabetes mellitus type 2, uncontrolled ([place]) -Reasonably controlled over the last 24+ hours on current dose of Semglee, resistant SSI with bedtime scale, mealtime NovoLog 3 units and Actos.    Esophageal reflux - continue PPI   OSA (obstructive sleep apnea) - continue qhs CPAP   Essential hypertension, benign - continue current regimen    Pure hypercholesterolemia - hold statin for now    Constipation Continue bowel regimen.  No BM yet but no concerning symptoms.  Increased MiraLAX and senna.  Nutritional Status Nutrition Problem: Inadequate oral intake Etiology: decreased appetite Signs/Symptoms: per patient/family  report Interventions: Glucerna shake, MVI, Prostat   DVT prophylaxis: SCDs Start: 06/11/21 2153     Code Status: Full Code Family Communication: None at bedside today Disposition:  Status is: Inpatient  Remains inpatient appropriate because:Inpatient level of care appropriate due to severity of illness  Dispo: The patient is from:  Home              Anticipated d/c is to:  TBD              Patient currently is not medically stable to d/c.   Difficult to place patient Yes.  May be DTP due to morbid obesity, nonweightbearing on left lower extremity etc.        Consultants:   Orthopedics  Procedures:   Left knee reduction, 06/11/21  Antimicrobials:    Anti-infectives (From admission, onward)    Start     Dose/Rate Route Frequency Ordered Stop   06/11/21 2230  cephALEXin (KEFLEX) capsule 250 mg        250 mg Oral Daily at bedtime 06/11/21 2221  Subjective:  No new complaints.  Slept well at night.  No BM yet.  Passing flatus.  Willing to try increased dose of laxatives.  Discussed with RN.  Objective:   Vitals:   06/19/21 1213 06/19/21 2039 06/19/21 2057 06/20/21 0521  BP: (!) 168/52  (!) 158/50 (!) 146/52  Pulse: 78 77 75 77  Resp: $Remo'18 18  20  'ooatm$ Temp: 98.3 F (36.8 C)  99 F (37.2 C) 97.7 F (36.5 C)  TempSrc: Oral  Oral Oral  SpO2: 92% 92% 94% 96%  Weight:      Height:        General exam: Middle-age male, moderately built and morbidly obese lying comfortably supine in bed without distress. Respiratory system: Clear to auscultation.  No increased work of breathing. Cardiovascular system: S1 & S2 heard, RRR. No JVD, murmurs, rubs, gallops or clicks. No pedal edema.   Gastrointestinal system: Abdomen is nondistended, soft and nontender. No organomegaly or masses felt. Normal bowel sounds heard. Central nervous system: Alert and oriented. No focal neurological deficits. Extremities: Symmetric 5 x 5 power except for left lower extremity which was not  assessed for power.  Left lower extremity is in immobilizer with dressing over wounds underneath.  He has bilateral great toe amputations.  Did not open left lower extremity knee immobilizer or dressing but from what is visible, appeared clean and dry. Skin: No rashes, lesions or ulcers Psychiatry: Judgement and insight appear normal. Mood & affect appropriate.   Pictures below from 06/18/2021, appears stable.  Leg was examined by me personally on 06/18/2021 when nurses took down his dressing.      Data Reviewed:   I have personally reviewed following labs and imaging studies   CBC: Recent Labs  Lab 06/15/21 0256 06/16/21 0339 06/17/21 0325 06/19/21 0334 06/20/21 0014 06/20/21 0733  WBC 7.7 7.3 8.0 8.9 8.6 8.2  NEUTROABS 4.8 4.5 5.1  --   --   --   HGB 8.7* 8.5* 8.7* 9.2* 8.8* 9.4*  HCT 27.6* 27.1* 28.0* 30.0* 28.5* 31.0*  MCV 92.3 92.8 93.0 94.3 93.4 95.4  PLT 283 318 356 490* 505* 537*    Basic Metabolic Panel: Recent Labs  Lab 06/15/21 0256 06/16/21 0339 06/17/21 0325 06/18/21 0340 06/19/21 0334 06/20/21 0014  NA 141 138 136 139 136 136  K 4.8 4.7 4.7 5.2* 4.8 5.0  CL 107 105 104 110 104 104  CO2 $Re'26 25 24 25 22 25  'UWC$ GLUCOSE 177* 143* 181* 268* 186* 145*  BUN 74* 70* 70* 69* 60* 50*  CREATININE 2.57* 1.93* 1.65* 1.52* 1.35* 1.32*  CALCIUM 8.2* 7.9* 8.1* 8.4* 8.5* 8.5*  MG 2.3 2.4 2.6*  --   --   --     Liver Function Tests: No results for input(s): AST, ALT, ALKPHOS, BILITOT, PROT, ALBUMIN in the last 168 hours.   CBG: Recent Labs  Lab 06/20/21 0304 06/20/21 0748 06/20/21 1204  GLUCAP 134* 118* 175*    Microbiology Studies:   Recent Results (from the past 240 hour(s))  Resp Panel by RT-PCR (Flu A&B, Covid) Nasopharyngeal Swab     Status: None   Collection Time: 06/11/21  5:09 PM   Specimen: Nasopharyngeal Swab; Nasopharyngeal(NP) swabs in vial transport medium  Result Value Ref Range Status   SARS Coronavirus 2 by RT PCR NEGATIVE NEGATIVE Final     Comment: (NOTE) SARS-CoV-2 target nucleic acids are NOT DETECTED.  The SARS-CoV-2 RNA is generally detectable in upper respiratory specimens during the acute phase  of infection. The lowest concentration of SARS-CoV-2 viral copies this assay can detect is 138 copies/mL. A negative result does not preclude SARS-Cov-2 infection and should not be used as the sole basis for treatment or other patient management decisions. A negative result may occur with  improper specimen collection/handling, submission of specimen other than nasopharyngeal swab, presence of viral mutation(s) within the areas targeted by this assay, and inadequate number of viral copies(<138 copies/mL). A negative result must be combined with clinical observations, patient history, and epidemiological information. The expected result is Negative.  Fact Sheet for Patients:  EntrepreneurPulse.com.au  Fact Sheet for Healthcare Providers:  IncredibleEmployment.be  This test is no t yet approved or cleared by the [place] FDA and  has been authorized for detection and/or diagnosis of SARS-CoV-2 by FDA under an Emergency Use Authorization (EUA). This EUA will remain  in effect (meaning this test can be used) for the duration of the COVID-19 declaration under Section 564(b)(1) of the Act, 21 U.S.C.section 360bbb-3(b)(1), unless the authorization is terminated  or revoked sooner.       Influenza A by PCR NEGATIVE NEGATIVE Final   Influenza B by PCR NEGATIVE NEGATIVE Final    Comment: (NOTE) The Xpert Xpress SARS-CoV-2/FLU/RSV plus assay is intended as an aid in the diagnosis of influenza from Nasopharyngeal swab specimens and should not be used as a sole basis for treatment. Nasal washings and aspirates are unacceptable for Xpert Xpress SARS-CoV-2/FLU/RSV testing.  Fact Sheet for Patients: EntrepreneurPulse.com.au  Fact Sheet for Healthcare  Providers: IncredibleEmployment.be  This test is not yet approved or cleared by the [place] FDA and has been authorized for detection and/or diagnosis of SARS-CoV-2 by FDA under an Emergency Use Authorization (EUA). This EUA will remain in effect (meaning this test can be used) for the duration of the COVID-19 declaration under Section 564(b)(1) of the Act, 21 U.S.C. section 360bbb-3(b)(1), unless the authorization is terminated or revoked.  Performed at [facility], [place] [address], [place], [place] [address]      Radiology Studies:  No results found.   Scheduled Meds:    (feeding supplement) PROSource Plus  30 mL Oral BID BM   cephALEXin  250 mg Oral QHS   docusate sodium  100 mg Oral BID   feeding supplement (GLUCERNA SHAKE)  237 mL Oral BID BM   FLUoxetine  40 mg Oral QPM   insulin aspart  0-20 Units Subcutaneous TID WC   insulin aspart  0-5 Units Subcutaneous QHS   insulin aspart  3 Units Subcutaneous TID WC   insulin glargine-yfgn  50 Units Subcutaneous QHS   methocarbamol  1,000 mg Oral BID   multivitamin with minerals  1 tablet Oral Daily   pantoprazole  40 mg Oral Daily   pioglitazone  45 mg Oral Daily   polyethylene glycol  17 g Oral Daily   tamsulosin  0.4 mg Oral Daily   warfarin  7.5 mg Oral ONCE-1600   Warfarin - Pharmacist Dosing Inpatient   Does not apply q1600    Continuous Infusions:    heparin 2,700 Units/hr (06/20/21 1303)      LOS: 8 days     Vernell Leep, MD, [place], [facility]. [facility]    To contact the attending provider between 7A-7P or the covering provider during after hours 7P-7A, please log into the web site [email or web address] and access using universal [place] password for that web site. If you do not have the password, please call the hospital  operator.  06/20/2021, 1:12 PM

## 2021-06-21 DIAGNOSIS — E669 Obesity, unspecified: Secondary | ICD-10-CM

## 2021-06-21 DIAGNOSIS — E1165 Type 2 diabetes mellitus with hyperglycemia: Secondary | ICD-10-CM

## 2021-06-21 DIAGNOSIS — S83105A Unspecified dislocation of left knee, initial encounter: Secondary | ICD-10-CM | POA: Diagnosis not present

## 2021-06-21 DIAGNOSIS — G4733 Obstructive sleep apnea (adult) (pediatric): Secondary | ICD-10-CM

## 2021-06-21 DIAGNOSIS — R791 Abnormal coagulation profile: Secondary | ICD-10-CM

## 2021-06-21 DIAGNOSIS — N179 Acute kidney failure, unspecified: Secondary | ICD-10-CM | POA: Diagnosis not present

## 2021-06-21 LAB — CBC
HCT: 32.3 % — ABNORMAL LOW (ref 39.0–52.0)
Hemoglobin: 9.8 g/dL — ABNORMAL LOW (ref 13.0–17.0)
MCH: 29.1 pg (ref 26.0–34.0)
MCHC: 30.3 g/dL (ref 30.0–36.0)
MCV: 95.8 fL (ref 80.0–100.0)
Platelets: 486 10*3/uL — ABNORMAL HIGH (ref 150–400)
RBC: 3.37 MIL/uL — ABNORMAL LOW (ref 4.22–5.81)
RDW: 16 % — ABNORMAL HIGH (ref 11.5–15.5)
WBC: 7.6 10*3/uL (ref 4.0–10.5)
nRBC: 0.5 % — ABNORMAL HIGH (ref 0.0–0.2)

## 2021-06-21 LAB — PROTIME-INR
INR: 1.3 — ABNORMAL HIGH (ref 0.8–1.2)
Prothrombin Time: 16.6 seconds — ABNORMAL HIGH (ref 11.4–15.2)

## 2021-06-21 LAB — GLUCOSE, CAPILLARY
Glucose-Capillary: 159 mg/dL — ABNORMAL HIGH (ref 70–99)
Glucose-Capillary: 196 mg/dL — ABNORMAL HIGH (ref 70–99)
Glucose-Capillary: 183 mg/dL — ABNORMAL HIGH (ref 70–99)
Glucose-Capillary: 193 mg/dL — ABNORMAL HIGH (ref 70–99)

## 2021-06-21 LAB — HEPARIN LEVEL (UNFRACTIONATED): Heparin Unfractionated: 0.39 IU/mL (ref 0.30–0.70)

## 2021-06-21 MED ORDER — BISACODYL 10 MG RE SUPP
10.0000 mg | Freq: Every day | RECTAL | Status: DC | PRN
  Administered 2021-06-21 – 2021-06-30 (×3): 10 mg via RECTAL
  Filled 2021-06-21 (×3): qty 1

## 2021-06-21 MED ORDER — WARFARIN SODIUM 5 MG PO TABS
5.0000 mg | ORAL_TABLET | Freq: Once | ORAL | Status: AC
  Administered 2021-06-21: 5 mg via ORAL
  Filled 2021-06-21: qty 1

## 2021-06-21 NOTE — Plan of Care (Signed)

## 2021-06-21 NOTE — TOC Transition Note (Signed)
Transition of Care [facility]) - CM/SW Discharge Note   Patient Details  Name: Joseph [name]. MRN: [id number] Date of Birth: 1956-05-28  Transition of [facility]) CM/SW Contact:  Thompson Mckim, Marta Lamas, LCSW Phone Number: 06/21/2021, 11:39 AM   Clinical Narrative:     Awaiting Acute Inpatient Rehab Physician to review PT/OT notes to determine the best plan of care for patient, whether it be Acute Inpatient Rehabilitation, CIR or short-term SNF.  Will await consult.   Patient Goals and CMS Choice   Prefers Acute Inpatient Rehab or CIR.    Discharge Placement              To be determined.      Discharge Plan and Services  Inpatient Acute Rehab, CIR, or Short-Term SNF.  Social Determinants of Health (SDOH) Interventions   Level of Care Concerns.  Readmission Risk Interventions No flowsheet data found.  Nat Christen, BSW, MSW, [employer]  Licensed Holiday representative  [employer]  Mailing Address-[address], [place], [place] [address] Physical Address-[address] [place], [place] [address] Toll Free Main # [phone] Fax # [phone] Cell # [phone]  [email or web address]

## 2021-06-21 NOTE — Progress Notes (Signed)
PROGRESS NOTE    Joseph [name].   SMO:[id number]  DOB: August 15, 1956  DOA: 06/11/2021 PCP: Donald Prose, MD   Brief Narrative:  Joseph [name]. is a 65 y.o. male with PMH morbid obesity, anxiety/depression, hx left knee DVT, PE (on chronic Coumadin), GERD, HTN, IDA, nonspecific myalgias/myositis, OSA on CPAP, PVD, polyneuropathy, DMII, HLD, multiple falls who presented to the ER after another fall.    He was initially seen in the ER on 06/08/2021 after a fall at home.  He had fallen getting out of the shower. He underwent imaging of CT C-spine, CT head, hip/pelvis x-rays, and left knee x-ray.  There were no acute findings and he was discharged home. He had attempted to get out of bed and again had another fall. Appears that his left knee may have been dislocated from this fall and was replaced back in socket by EMS. In the ER he then had another fall out of bed with left knee dislocation. Ortho was paged and performed reduction under conscious sedation.   He again underwent multiple imaging studies in the ER on 9/15. No visible acute fractures but body habitus does limit some of the studies. His CT A/P was motion degraded and was intended to evaluate for RP bleed. Imaging studies do show a left knee effusion which is a presumed large hematoma due to his supratherapeutic INR on admission which was reversed some with Vit K.    He was admitted for further pain control and monitoring of left leg for any development of compartment syndrome and further evaluation as needed.  Currently stable pending SNF placement and IV heparin bridging to Coumadin.     Subjective: No new complaints.     Assessment & Plan:   Left knee dislocation - s/p fall at home x 2 week of admission and again in ER on 9/15 - xray shows fracture fragments as well (presumed from proximal tibia per xray read) -As per extensive communication with Dr. Erlinda Hong, orthopedics on 9/20: No weightbearing to LLE, no ROM, knee immobilizer at  all times.  He indicated that the knee is very unstable and not a candidate for surgery or external fixation due to body habitus and leg wounds.   - PT and OT have directly communicated with Dr. Erlinda Hong to clarify his left lower extremity restrictions and what is doable and what does not. - Declined by CIR as not appropriate.  Difficult disposition due to very morbid obesity, nonweightbearing on left lower extremity -Examined left leg wounds on 9/22, pictures as below. -Clinically left leg findings no longer consistent with infection i.e. cellulitis.  As per pharmacy review, appears to be on chronic Keflex for urological indication.  -monitor closely for rebleeding while on IV heparin/Coumadin- INR 1.3 today - eventually needs MRI knee once more stable   Effusion of left knee - concerned for large hematoma given amount of bruising and swelling with INR 6.2 on admission - Coumadin was held for several days and INR was reversed with vitamin K -No clinical worsening.  Improving. -No surgical intervention at this time per Ortho  Acute blood loss anemia due to bleeding into knee - likely mixed from ACD and hematoma - baseline Hgb around 12 g/dL - admitted with Hgb 9.9 g/dL with large LLE bruising and presumed hematoma/effusion - now is s/p 2 units PRBC total for admission and 1 unit FFP - Hgb stable in the mid 9-10 g range; bruising on knee improving -Follow CBC periodically.  Acute renal failure superimposed on stage 3b chronic kidney disease ([place]) - patient has history of CKD3a. Baseline creat ~ 1.4 -1.5, eGFR 51 -56 - may have developed an ATN from hypoperfusion during blood loss - too difficult to obtain renal u/s at this time -Briefly on IV fluids.  Now discontinued. -Creatinine has gradually improved and now at baseline.  Avoid nephrotoxic's and periodically monitor BMP - continue holding BP meds   Mild hyperkalemia -Resolved.    Hx pulmonary embolism - on chronic coumadin at home.  On  hold on admission due to suspected hematoma of left knee, supratherapeutic INR which was reversed with vitamin K -Communicated with Dr.Xu, orthopedics on 9/23 was okay to resume Coumadin. - As per discussion with patient, he reports history of DVT/PE and assigned to lifelong anticoagulation due to body habitus and relative sedentary lifestyle. - Initiated IV heparin bridging with Coumadin per pharmacy.   Coronary atherosclerosis - no CP - holding statin.   Body mass index is 60.69 kg/m./Very morbid obesity - Complicates overall prognosis and care   Diabetic neuropathy ([address]) - continue current pain regimen.  Controlled.   Diabetes mellitus type 2, uncontrolled ([place]) -Reasonably controlled over the last 24+ hours on current dose of Semglee, resistant SSI with bedtime scale, mealtime NovoLog 3 units and Actos.     Esophageal reflux - continue PPI   OSA (obstructive sleep apnea) - continue qhs CPAP   Essential hypertension, benign - continue current regimen    Pure hypercholesterolemia - hold statin for now    Constipation Continue bowel regimen.  No BM yet but no concerning symptoms.  Increased MiraLAX and senna.   Nutritional Status Nutrition Problem: Inadequate oral intake Etiology: decreased appetite Signs/Symptoms: per patient/family report Interventions: Glucerna shake, MVI, Prostat    Time spent in minutes: 35 min DVT prophylaxis: SCDs Start: 06/11/21 2153 warfarin (COUMADIN) tablet 5 mg  Code Status: Full code Family Communication:  Level of Care: Level of care: Med-Surg Disposition Plan:  Status is: Inpatient  Remains inpatient appropriate because:Unsafe d/c plan and Inpatient level of care appropriate due to severity of illness  Dispo: The patient is from: Home              Anticipated d/c is to: SNF              Patient currently is medically stable to d/c.   Difficult to place patient Yes      Consultants:  Orthopedic surgery Procedures:  Left  knee reduction, 06/11/21 Antimicrobials:  Anti-infectives (From admission, onward)    Start     Dose/Rate Route Frequency Ordered Stop   06/11/21 2230  cephALEXin (KEFLEX) capsule 250 mg        250 mg Oral Daily at bedtime 06/11/21 2221          Objective: Vitals:   06/20/21 0521 06/20/21 1300 06/20/21 1939 06/21/21 0522  BP: (!) 146/52 (!) 128/34 (!) 155/40 (!) 164/58  Pulse: 77 71 73 79  Resp: 20 18 16 16   Temp: 97.7 F (36.5 C) 98.2 F (36.8 C) 98.1 F (36.7 C) 97.6 F (36.4 C)  TempSrc: Oral Oral Oral Oral  SpO2: 96% 93% 95% 96%  Weight:      Height:        Intake/Output Summary (Last 24 hours) at 06/21/2021 1454 Last data filed at 06/21/2021 1412 Gross per 24 hour  Intake 1487.97 ml  Output 1201 ml  Net 286.97 ml   Filed Weights   06/11/21 1326  Weight: (!) 208.7 kg    Examination: General exam: Morbidly obese male, laying in bed with CPAP- Appears comfortable  HEENT: PERRL, no sclera icterus or thrush Respiratory system: Clear to auscultation. Respiratory effort normal. Cardiovascular system: S1 & S2 heard, RRR.  No pedal edema Gastrointestinal system: Abdomen soft, non-tender, nondistended. Normal bowel sounds. Central nervous system: Alert and oriented. No focal neurological deficits. Extremities: No cyanosis, clubbing - immobilizer on left leg Skin: No rashes or ulcers Psychiatry:  Mood & affect appropriate.     Data Reviewed: I have personally reviewed following labs and imaging studies  CBC: Recent Labs  Lab 06/15/21 0256 06/16/21 0339 06/17/21 0325 06/19/21 0334 06/20/21 0014 06/20/21 0733 06/21/21 0309  WBC 7.7 7.3 8.0 8.9 8.6 8.2 7.6  NEUTROABS 4.8 4.5 5.1  --   --   --   --   HGB 8.7* 8.5* 8.7* 9.2* 8.8* 9.4* 9.8*  HCT 27.6* 27.1* 28.0* 30.0* 28.5* 31.0* 32.3*  MCV 92.3 92.8 93.0 94.3 93.4 95.4 95.8  PLT 283 318 356 490* 505* 537* 616*   Basic Metabolic Panel: Recent Labs  Lab 06/15/21 0256 06/16/21 0339 06/17/21 0325  06/18/21 0340 06/19/21 0334 06/20/21 0014  NA 141 138 136 139 136 136  K 4.8 4.7 4.7 5.2* 4.8 5.0  CL 107 105 104 110 104 104  CO2 26 25 24 25 22 25   GLUCOSE 177* 143* 181* 268* 186* 145*  BUN 74* 70* 70* 69* 60* 50*  CREATININE 2.57* 1.93* 1.65* 1.52* 1.35* 1.32*  CALCIUM 8.2* 7.9* 8.1* 8.4* 8.5* 8.5*  MG 2.3 2.4 2.6*  --   --   --    GFR: Estimated Creatinine Clearance: 105.1 mL/min (A) (by C-G formula based on SCr of 1.32 mg/dL (H)). Liver Function Tests: No results for input(s): AST, ALT, ALKPHOS, BILITOT, PROT, ALBUMIN in the last 168 hours. No results for input(s): LIPASE, AMYLASE in the last 168 hours. No results for input(s): AMMONIA in the last 168 hours. Coagulation Profile: Recent Labs  Lab 06/17/21 0325 06/18/21 0340 06/19/21 0334 06/20/21 0014 06/21/21 0309  INR 1.4* 1.3* 1.3* 1.2 1.3*   Cardiac Enzymes: No results for input(s): CKTOTAL, CKMB, CKMBINDEX, TROPONINI in the last 168 hours. BNP (last 3 results) No results for input(s): PROBNP in the last 8760 hours. HbA1C: No results for input(s): HGBA1C in the last 72 hours. CBG: Recent Labs  Lab 06/20/21 1204 06/20/21 1640 06/20/21 1933 06/21/21 0734 06/21/21 1136  GLUCAP 175* 143* 128* 159* 196*   Lipid Profile: No results for input(s): CHOL, HDL, LDLCALC, TRIG, CHOLHDL, LDLDIRECT in the last 72 hours. Thyroid Function Tests: No results for input(s): TSH, T4TOTAL, FREET4, T3FREE, THYROIDAB in the last 72 hours. Anemia Panel: No results for input(s): VITAMINB12, FOLATE, FERRITIN, TIBC, IRON, RETICCTPCT in the last 72 hours. Urine analysis:    Component Value Date/Time   COLORURINE YELLOW 04/03/2020 0228   APPEARANCEUR CLEAR 04/03/2020 0228   LABSPEC 1.018 04/03/2020 0228   PHURINE 5.0 04/03/2020 0228   GLUCOSEU NEGATIVE 04/03/2020 0228   HGBUR NEGATIVE 04/03/2020 0228   BILIRUBINUR NEGATIVE 04/03/2020 0228   KETONESUR 5 (A) 04/03/2020 0228   PROTEINUR NEGATIVE 04/03/2020 0228   UROBILINOGEN  0.2 12/30/2014 2032   NITRITE NEGATIVE 04/03/2020 0228   LEUKOCYTESUR NEGATIVE 04/03/2020 0228   Sepsis Labs: @LABRCNTIP (procalcitonin:4,lacticidven:4) ) Recent Results (from the past 240 hour(s))  Resp Panel by RT-PCR (Flu A&B, Covid) Nasopharyngeal Swab     Status: None   Collection Time: 06/11/21  5:09 PM  Specimen: Nasopharyngeal Swab; Nasopharyngeal(NP) swabs in vial transport medium  Result Value Ref Range Status   SARS Coronavirus 2 by RT PCR NEGATIVE NEGATIVE Final    Comment: (NOTE) SARS-CoV-2 target nucleic acids are NOT DETECTED.  The SARS-CoV-2 RNA is generally detectable in upper respiratory specimens during the acute phase of infection. The lowest concentration of SARS-CoV-2 viral copies this assay can detect is 138 copies/mL. A negative result does not preclude SARS-Cov-2 infection and should not be used as the sole basis for treatment or other patient management decisions. A negative result may occur with  improper specimen collection/handling, submission of specimen other than nasopharyngeal swab, presence of viral mutation(s) within the areas targeted by this assay, and inadequate number of viral copies(<138 copies/mL). A negative result must be combined with clinical observations, patient history, and epidemiological information. The expected result is Negative.  Fact Sheet for Patients:  EntrepreneurPulse.com.au  Fact Sheet for Healthcare Providers:  IncredibleEmployment.be  This test is no t yet approved or cleared by the [place] FDA and  has been authorized for detection and/or diagnosis of SARS-CoV-2 by FDA under an Emergency Use Authorization (EUA). This EUA will remain  in effect (meaning this test can be used) for the duration of the COVID-19 declaration under Section 564(b)(1) of the Act, 21 U.S.C.section 360bbb-3(b)(1), unless the authorization is terminated  or revoked sooner.       Influenza A by PCR  NEGATIVE NEGATIVE Final   Influenza B by PCR NEGATIVE NEGATIVE Final    Comment: (NOTE) The Xpert Xpress SARS-CoV-2/FLU/RSV plus assay is intended as an aid in the diagnosis of influenza from Nasopharyngeal swab specimens and should not be used as a sole basis for treatment. Nasal washings and aspirates are unacceptable for Xpert Xpress SARS-CoV-2/FLU/RSV testing.  Fact Sheet for Patients: EntrepreneurPulse.com.au  Fact Sheet for Healthcare Providers: IncredibleEmployment.be  This test is not yet approved or cleared by the [place] FDA and has been authorized for detection and/or diagnosis of SARS-CoV-2 by FDA under an Emergency Use Authorization (EUA). This EUA will remain in effect (meaning this test can be used) for the duration of the COVID-19 declaration under Section 564(b)(1) of the Act, 21 U.S.C. section 360bbb-3(b)(1), unless the authorization is terminated or revoked.  Performed at [facility], [place] [address] [place], [place] [address]          Radiology Studies: No results found.    Scheduled Meds:  (feeding supplement) PROSource Plus  30 mL Oral BID BM   cephALEXin  250 mg Oral QHS   docusate sodium  100 mg Oral BID   feeding supplement (GLUCERNA SHAKE)  237 mL Oral BID BM   FLUoxetine  40 mg Oral QPM   insulin aspart  0-20 Units Subcutaneous TID WC   insulin aspart  0-5 Units Subcutaneous QHS   insulin aspart  3 Units Subcutaneous TID WC   insulin glargine-yfgn  50 Units Subcutaneous QHS   methocarbamol  1,000 mg Oral BID   multivitamin with minerals  1 tablet Oral Daily   pantoprazole  40 mg Oral Daily   pioglitazone  45 mg Oral Daily   polyethylene glycol  17 g Oral BID   senna  1 tablet Oral Daily   tamsulosin  0.4 mg Oral Daily   warfarin  5 mg Oral ONCE-1600   Warfarin - Pharmacist Dosing Inpatient   Does not apply q1600   Continuous Infusions:  heparin 2,700 Units/hr (06/21/21  0535)     LOS:  9 days      Debbe Odea, MD [facility] Pager: [email or web address] 06/21/2021, 2:54 PM

## 2021-06-21 NOTE — Progress Notes (Signed)
[place] for Warfarin with heparin bridge Indication: h/o pulmonary embolus  Allergies  Allergen Reactions   Tape     Peels skin on legs   Chlorhexidine Rash    Wipes    Patient Measurements: Height: 6\' 1"  (185.4 cm) Weight: (!) 208.7 kg (460 lb) IBW/kg (Calculated) : 79.9 Heparin Dosing Weight: 132.5 kg  Vital Signs: Temp: 97.6 F (36.4 C) (09/25 0522) Temp Source: Oral (09/25 0522) BP: 164/58 (09/25 0522) Pulse Rate: 79 (09/25 0522)  Labs: Recent Labs    06/19/21 0334 06/19/21 0334 06/20/21 0014 06/20/21 0733 06/20/21 1324 06/21/21 0309  HGB 9.2*  --  8.8* 9.4*  --  9.8*  HCT 30.0*  --  28.5* 31.0*  --  32.3*  PLT 490*  --  505* 537*  --  486*  LABPROT 15.9*  --  15.4*  --   --  16.6*  INR 1.3*  --  1.2  --   --  1.3*  HEPARINUNFRC  --    < > 0.12* 0.52 0.42 0.39  CREATININE 1.35*  --  1.32*  --   --   --    < > = values in this interval not displayed.     Estimated Creatinine Clearance: 105.1 mL/min (A) (by C-G formula based on SCr of 1.32 mg/dL (H)).   Medical History: Past Medical History:  Diagnosis Date   Anxiety state, unspecified    Aortic atherosclerosis ([place]) 06/12/2021   Depression    DVT (deep venous thrombosis) ([place]) 2004   Left knee   Edema    Esophageal reflux    occ   Essential hypertension, benign    Essential hypertension, benign    Falls frequently    History of iron deficiency anemia    Morbid obesity ([facility])    Myalgia and myositis, unspecified    BACK AND LEGS   OSA on CPAP    Peripheral vascular disease, unspecified ([facility])    Personal history of PE (pulmonary embolism) 2004   Polyneuropathy in diabetes(357.2)    Poor venous access    PT STATES DIFFICULT TO DRAW BLOOD FROM HIS VEINS   Pure hypercholesterolemia    Shortness of breath    DUE TO WEIGHT AND LOSS OF MOBILITY   Type II or unspecified type diabetes mellitus with neurological manifestations, not stated as uncontrolled(250.60)     Type II or unspecified type diabetes mellitus without mention of complication, not stated as uncontrolled    Type II or unspecified type diabetes mellitus without mention of complication, uncontrolled    Varicose veins of lower extremities with inflammation    Wears glasses    Wound infection after surgery    required hospitalization    Medications:  PTA warfarin dosing:  Take 5 mg in the evening on Sun, Mon, Wed, and Fri. Take 7.5 mg in the evening on Tue, Thur, and Sat [place] Must   INR was SUPRAtherapeutic (6.2) on admission  Assessment: 65 y/o M on lifelong anticoagulation with warfarin for h/o PE, body habitus, and sedentary lifestyle admitted with left knee dislocation and effusion concerning for hematoma. On admission - warfarin was held, vitamin K was given for supratherapeutic INR, and pt was transfused. Ortho following - no surgical intervention planned.   Pharmacy consulted to resume warfarin on 9/23 with heparin bridge while INR subtherapeutic.   Significant Events: -Warfarin held from 9/15 - 9/22 -Vitamin K given on 9/15, 9/16, and 9/17  Today, 06/21/21 INR = 1.3  remains subtherapeutic as expected after holding warfarin and reversing with vitamin K. Anticipate it taking 3-5 days to see reflection of resuming warfarin in INR HL = 0.39 remains therapeutic on heparin infusion of 2700 units/hr CBC: Hgb low but stable; Plt elevated Confirmed with RN that heparin infusing at correct rate - no interruptions.  Noted that pt has bruising on leg s/p fall. MD notes superficial open wounds/ruptured blister oozing blood. RN reports that night shift reported no longer oozing when dressing changed. Bruising stable.  Goal of Therapy:  INR 2-3 Heparin level 0.3-0.7 units/ml Monitor platelets by anticoagulation protocol: Yes   Plan:  Continue heparin infusion at current rate of 2700 units/hr Warfarin 5 mg PO once today HL, CBC, INR daily Monitor for signs of bleeding  Lenis Noon, PharmD 06/21/21 9:57 AM

## 2021-06-21 NOTE — Progress Notes (Signed)
Inpatient Rehab Admissions Coordinator:   I requested rehab MD review Pt. For medical necessity. He felt that pt. Does meet medical necessity criteria for CIR. However, Pt. Is currently not attempting any OOB so I am concerned Pt. Will be unable to tolerate intensity of CIR. I certainly cannot get Pt.'s medicare advantage plan to approve CIR until he is at least attempting some OOB. I will reach out to acute rehab and TOC to discuss.  Clemens Catholic, [place], [place] Admissions Coordinator  [phone] ([place]) [phone] (office)

## 2021-06-22 DIAGNOSIS — S83105A Unspecified dislocation of left knee, initial encounter: Secondary | ICD-10-CM | POA: Diagnosis not present

## 2021-06-22 LAB — CBC
HCT: 32.1 % — ABNORMAL LOW (ref 39.0–52.0)
Hemoglobin: 9.7 g/dL — ABNORMAL LOW (ref 13.0–17.0)
MCH: 28.5 pg (ref 26.0–34.0)
MCHC: 30.2 g/dL (ref 30.0–36.0)
MCV: 94.4 fL (ref 80.0–100.0)
Platelets: 551 10*3/uL — ABNORMAL HIGH (ref 150–400)
RBC: 3.4 MIL/uL — ABNORMAL LOW (ref 4.22–5.81)
RDW: 16.3 % — ABNORMAL HIGH (ref 11.5–15.5)
WBC: 7.6 10*3/uL (ref 4.0–10.5)
nRBC: 0.4 % — ABNORMAL HIGH (ref 0.0–0.2)

## 2021-06-22 LAB — PROTIME-INR
INR: 1.4 — ABNORMAL HIGH (ref 0.8–1.2)
Prothrombin Time: 17.4 seconds — ABNORMAL HIGH (ref 11.4–15.2)

## 2021-06-22 LAB — GLUCOSE, CAPILLARY
Glucose-Capillary: 213 mg/dL — ABNORMAL HIGH (ref 70–99)
Glucose-Capillary: 249 mg/dL — ABNORMAL HIGH (ref 70–99)
Glucose-Capillary: 198 mg/dL — ABNORMAL HIGH (ref 70–99)
Glucose-Capillary: 147 mg/dL — ABNORMAL HIGH (ref 70–99)

## 2021-06-22 LAB — HEPARIN LEVEL (UNFRACTIONATED): Heparin Unfractionated: 0.45 IU/mL (ref 0.30–0.70)

## 2021-06-22 MED ORDER — ENSURE MAX PROTEIN PO LIQD
11.0000 [oz_av] | Freq: Every day | ORAL | Status: DC
  Administered 2021-06-22 – 2021-07-28 (×33): 11 [oz_av] via ORAL
  Filled 2021-06-22 (×35): qty 330

## 2021-06-22 MED ORDER — GLUCERNA SHAKE PO LIQD
237.0000 mL | ORAL | Status: DC
  Administered 2021-06-23 – 2021-07-28 (×29): 237 mL via ORAL
  Filled 2021-06-22 (×13): qty 237

## 2021-06-22 MED ORDER — WARFARIN SODIUM 5 MG PO TABS
5.0000 mg | ORAL_TABLET | Freq: Once | ORAL | Status: AC
  Administered 2021-06-22: 5 mg via ORAL
  Filled 2021-06-22: qty 1

## 2021-06-22 NOTE — Progress Notes (Signed)
Patient placed self on home cpap at this time.

## 2021-06-22 NOTE — Care Management Important Message (Signed)
Important Message  Patient Details IM Letter given to the Patient. Name: Joseph [name]. MRN: [id number] Date of Birth: 1956/05/10   Medicare Important Message Given:  Yes     Kerin Salen 06/22/2021, 1:41 PM

## 2021-06-22 NOTE — Progress Notes (Signed)
Occupational Therapy Treatment Patient Details Name: Joseph [name]. MRN: [id number] DOB: 12-Jun-1956 Today's Date: 06/22/2021   History of present illness Joseph [name] is a 65 yo male who presnets with increased LLE swelling after recent fall at home. 9/15 pt fell when getting off x-ray table at hospital. X-rays showed a knee dislocation which was confirmed on CT scan as a posterior lateral dislocation; L knee reduced and L KI placed. Lumbar and pelvic xrays negative. 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 Patient was very motivated to participate in session with active use of BUE to assist with scooting and rolling in bed. Patient's discharge plan remains appropriate at this time. OT will continue to follow acutely.     Recommendations for follow up therapy are one component of a multi-disciplinary discharge planning process, led by the attending physician.  Recommendations may be updated based on patient status, additional functional criteria and insurance authorization.    Follow Up Recommendations  CIR    Equipment Recommendations       Recommendations for Other Services      Precautions / Restrictions Precautions Precautions: Fall Precaution Comments: NO knee ROM Required Braces or Orthoses: Knee Immobilizer - Left Knee Immobilizer - Left: On at all times Restrictions Weight Bearing Restrictions: Yes LLE Weight Bearing: Non weight bearing       Mobility Bed Mobility Overal bed mobility: Needs Assistance Bed Mobility: Supine to Sit;Sit to Supine Rolling: +2 for physical assistance;+2 for safety/equipment;Max assist   Supine to sit: +2 for physical assistance;+2 for safety/equipment;Max assist;HOB elevated     General bed mobility comments: patient needed +3 for supine to sit on edge of bed on this date with increased time to adjust  environment to support patients comfort sitting on edge of bed while maintaining L knee precautions.    Transfers                      Balance Overall balance assessment: Needs assistance Sitting-balance support: No upper extremity supported Sitting balance-Leahy Scale: Fair Sitting balance - Comments: fair at edge of bed - but min guard for safety                                   ADL either performed or assessed with clinical judgement   ADL Overall ADL's : Needs assistance/impaired                                       General ADL Comments: patient was educated on importance of attempting to participate in HEP each day for BUE. patient verbalized understanding.     Vision Baseline Vision/History: 1 Wears glasses Patient Visual Report: No change from baseline     Perception     Praxis      Cognition Arousal/Alertness: Awake/alert Behavior During Therapy: WFL for tasks assessed/performed Overall Cognitive Status: Within Functional Limits for tasks assessed                                 General Comments: wife was present during session        Exercises  Shoulder Instructions       General Comments      Pertinent Vitals/ Pain       Pain Assessment: 0-10 Pain Score: 10-Worst pain ever Pain Location: Left knee Pain Descriptors / Indicators: Grimacing;Aching;Sore;Discomfort;Guarding Pain Intervention(s): Limited activity within patient's tolerance;Monitored during session;Repositioned;RN gave pain meds during session   Frequency  Min 2X/week        Progress Toward Goals  OT Goals(current goals can now be found in the care plan section)  Progress towards OT goals: Progressing toward goals  Acute Rehab OT Goals Patient Stated Goal: to help move as much as possible  Plan Discharge plan remains appropriate    Co-evaluation    PT/OT/SLP Co-Evaluation/Treatment: Yes Reason for Co-Treatment: To  address functional/ADL transfers;For patient/therapist safety PT goals addressed during session: Mobility/safety with mobility;Balance OT goals addressed during session: ADL's and self-care      AM-PAC OT "6 Clicks" Daily Activity     Outcome Measure   Help from another person eating meals?: None Help from another person taking care of personal grooming?: A Little Help from another person toileting, which includes using toliet, bedpan, or urinal?: Total Help from another person bathing (including washing, rinsing, drying)?: A Lot Help from another person to put on and taking off regular upper body clothing?: A Little Help from another person to put on and taking off regular lower body clothing?: Total 6 Click Score: 14    End of Session    OT Visit Diagnosis: Other abnormalities of gait and mobility (R26.89);Unsteadiness on feet (R26.81);Muscle weakness (generalized) (M62.81);Repeated falls (R29.6);Pain Pain - Right/Left: Left Pain - part of body: Knee   Activity Tolerance Patient limited by pain   Patient Left in bed;with call bell/phone within reach;with family/visitor present   Nurse Communication Patient requests pain meds        Time: 4462-8638 OT Time Calculation (min): 54 min  Charges: OT General Charges $OT Visit: 1 Visit OT Treatments $Therapeutic Activity: 23-37 mins  Jackelyn Poling OTR/L, [place] Acute Rehabilitation Department Office# [phone] Pager# [phone]   [place] 06/22/2021, 4:30 PM

## 2021-06-22 NOTE — Plan of Care (Signed)
  Problem: Clinical Measurements: Goal: Diagnostic test results will improve Outcome: Progressing Goal: Cardiovascular complication will be avoided Outcome: Progressing   Problem: Activity: Goal: Risk for activity intolerance will decrease Outcome: Progressing   

## 2021-06-22 NOTE — Progress Notes (Signed)
PROGRESS NOTE   Joseph [name].  IRJ:[id number]    DOB: Nov 09, 1955    DOA: 06/11/2021  PCP: Donald Prose, MD   I have briefly reviewed patients previous medical records in [facility].  Chief Complaint  Patient presents with   Leg Swelling   Leg Pain    left    Brief Narrative:   Joseph [name]. is a 65 y.o. male with PMH morbid obesity, anxiety/depression, hx left knee DVT, PE (on chronic Coumadin), GERD, HTN, IDA, nonspecific myalgias/myositis, OSA on CPAP, PVD, polyneuropathy, DMII, HLD, multiple falls who presented to the ER after another fall.    He was initially seen in the ER on 06/08/2021 after a fall at home.  He had fallen getting out of the shower. He underwent imaging of CT C-spine, CT head, hip/pelvis x-rays, and left knee x-ray.  There were no acute findings and he was discharged home. He had attempted to get out of bed and again had another fall. Appears that his left knee may have been dislocated from this fall and was replaced back in socket by EMS. In the ER he then had another fall out of bed with left knee dislocation. Ortho was paged and performed reduction under conscious sedation.   He again underwent multiple imaging studies in the ER on 9/15. No visible acute fractures but body habitus does limit some of the studies. His CT A/P was motion degraded and was intended to evaluate for RP bleed. Imaging studies do show a left knee effusion which is a presumed large hematoma due to his supratherapeutic INR on admission which was reversed some with Vit K.    He was admitted for further pain control and monitoring of left leg for any development of compartment syndrome and further evaluation as needed.  Currently stable pending SNF placement and IV heparin bridging to Coumadin.   Assessment & Plan:  Principal Problem:   Left knee dislocation, initial encounter Active Problems:   Pure hypercholesterolemia   Essential hypertension, benign   OSA (obstructive sleep  apnea)   Hx pulmonary embolism   Esophageal reflux   Diabetes mellitus type 2, uncontrolled (HCC)   Diabetic neuropathy (HCC)   Acute renal failure superimposed on stage 3b chronic kidney disease (HCC)   Normocytic anemia   Class 3 obesity   Aortic atherosclerosis (HCC)   Coronary atherosclerosis   Effusion of left knee   Left knee dislocation   Left knee dislocation - s/p fall at home x 2 week of admission and again in ER on 9/15 - xray shows fracture fragments as well (presumed from proximal tibia per xray read) -As per extensive communication with Dr. Erlinda Hong, orthopedics on 9/20: No weightbearing to LLE, no ROM, knee immobilizer at all times.  He indicated that the knee is very unstable and not a candidate for surgery or external fixation due to body habitus and leg wounds.   - PT and OT have directly communicated with Dr. Erlinda Hong to clarify his left lower extremity restrictions and what is doable and what does not. - Declined by CIR as not appropriate.  Difficult disposition due to very morbid obesity, nonweightbearing on left lower extremity -Examined left leg wounds on 9/22, pictures as below. -Clinically left leg findings no longer consistent with infection i.e. cellulitis.  As per pharmacy review, appears to be on chronic Keflex for urological indication. - Some blood oozing from superficial open wounds/ruptured blister-monitor closely while on IV heparin/Coumadin. - eventually needs MRI  knee once more stable -As per discussion with TOC team, CIR is evaluating to see if he is appropriate for CIR.  They have requested PT and OT to see today again.   Effusion of left knee - concerned for large hematoma given amount of bruising and swelling with INR 6.2 on admission - Coumadin was held for several days and INR was reversed with vitamin K -No clinical worsening.  Improving. -No surgical intervention at this time per Ortho   Normocytic anemia - likely mixed from ACD and hematoma -  baseline Hgb around 12 g/dL - admitted with Hgb 9.9 g/dL with large LLE bruising and presumed hematoma/effusion - now is s/p 2 units PRBC total for admission and 1 unit FFP -Hemoglobin stable in the mid 9 g range.   Acute renal failure superimposed on stage 3b chronic kidney disease ([place]) - patient has history of CKD3a. Baseline creat ~ 1.4 -1.5, eGFR 51 -56 - may have developed an ATN from hypoperfusion during blood loss - too difficult to obtain renal u/s at this time -Briefly on IV fluids.  Now discontinued. -Creatinine has gradually improved and now at baseline.  Avoid nephrotoxic's and periodically monitor BMP.  Requested BMP for tomorrow. - continue holding BP meds  Mild hyperkalemia -Resolved.    Hx pulmonary embolism - on chronic coumadin at home.  On hold on admission due to suspected hematoma of left knee, supratherapeutic INR which was reversed with vitamin K -Communicated with Dr.Xu, orthopedics on 9/23 was okay to resume Coumadin. - As per discussion with patient, he reports history of DVT/PE and assigned to lifelong anticoagulation due to body habitus and relative sedentary lifestyle. - Initiated IV heparin bridging with Coumadin per pharmacy.  INR 1.4.   Coronary atherosclerosis - no CP - holding statin.   Body mass index is 60.69 kg/m./Very morbid obesity - Complicates overall prognosis and care   Diabetic neuropathy ([place]) - continue current pain regimen.  Controlled.   Diabetes mellitus type 2, uncontrolled ([place]) -Reasonably controlled over the last 24+ hours on current dose of Semglee, resistant SSI with bedtime scale, mealtime NovoLog 3 units and Actos.    Esophageal reflux - continue PPI   OSA (obstructive sleep apnea) - continue qhs CPAP   Essential hypertension, benign - continue current regimen    Pure hypercholesterolemia - hold statin for now    Constipation Continue bowel regimen.  Has had a couple of BMs.  Continue MiraLAX and  senna.  Nutritional Status Nutrition Problem: Inadequate oral intake Etiology: decreased appetite Signs/Symptoms: per patient/family report Interventions: Glucerna shake, MVI, Premier Protein, Prostat   DVT prophylaxis: SCDs Start: 06/11/21 2153     Code Status: Full Code Family Communication: None at bedside today Disposition:  Status is: Inpatient  Remains inpatient appropriate because:Inpatient level of care appropriate due to severity of illness  Dispo: The patient is from:  Home              Anticipated d/c is to:  TBD              Patient currently is not medically stable to d/c.   Difficult to place patient Yes.  May be DTP due to morbid obesity, nonweightbearing on left lower extremity etc.        Consultants:   Orthopedics  Procedures:   Left knee reduction, 06/11/21  Antimicrobials:    Anti-infectives (From admission, onward)    Start     Dose/Rate Route Frequency Ordered Stop   06/11/21 2230  cephALEXin (KEFLEX) capsule 250 mg        250 mg Oral Daily at bedtime 06/11/21 2221           Subjective:  No new complaints.  Reportedly had 2 BMs overnight.  Objective:   Vitals:   06/21/21 2005 06/21/21 2118 06/22/21 0555 06/22/21 1422  BP: (!) 151/46  (!) 160/49 (!) 127/46  Pulse: 84  76 81  Resp: _0 Temp: 98 F (36.7 C)  97.8 F (36.6 C) 98.6 F (37 C)  TempSrc: Oral  Oral Oral  SpO2: 96%  95% (!) 89%  Weight:      Height:        General exam: Middle-age male, moderately built and morbidly obese lying comfortably supine in bed without distress. Respiratory system: Clear to auscultation.  No increased work of breathing. Cardiovascular system: S1 & S2 heard, RRR. No JVD, murmurs, rubs, gallops or clicks. No pedal edema.   Gastrointestinal system: Abdomen is nondistended, soft and nontender. No organomegaly or masses felt. Normal bowel sounds heard. Central nervous system: Alert and oriented. No focal neurological deficits. Extremities:  Symmetric 5 x 5 power except for left lower extremity which was not assessed for power.  Left lower extremity is in immobilizer with dressing over wounds underneath.  He has bilateral great toe amputations.  Did not open left lower extremity knee immobilizer or dressing but from what is visible, appeared clean and dry. Skin: No rashes, lesions or ulcers Psychiatry: Judgement and insight appear normal. Mood & affect appropriate.   Pictures below from 06/18/2021, appears stable.  Leg was examined by me personally on 06/18/2021 when nurses took down his dressing.      Data Reviewed:   I have personally reviewed following labs and imaging studies   CBC: Recent Labs  Lab 06/16/21 0339 06/17/21 0325 06/19/21 0334 06/20/21 0733 06/21/21 0309 06/22/21 0321  WBC 7.3 8.0   < > 8.2 7.6 7.6  NEUTROABS 4.5 5.1  --   --   --   --   HGB 8.5* 8.7*   < > 9.4* 9.8* 9.7*  HCT 27.1* 28.0*   < > 31.0* 32.3* 32.1*  MCV 92.8 93.0   < > 95.4 95.8 94.4  PLT 318 356   < > 537* 486* 551*   < > = values in this interval not displayed.    Basic Metabolic Panel: Recent Labs  Lab 06/16/21 0339 06/17/21 0325 06/18/21 0340 06/19/21 0334 06/20/21 0014  NA 138 136 139 136 136  K 4.7 4.7 5.2* 4.8 5.0  CL 105 104 110 104 104  CO2 _1 GLUCOSE 143* 181* 268* 186* 145*  BUN 70* 70* 69* 60* 50*  CREATININE 1.93* 1.65* 1.52* 1.35* 1.32*  CALCIUM 7.9* 8.1* 8.4* 8.5* 8.5*  MG 2.4 2.6*  --   --   --     Liver Function Tests: No results for input(s): AST, ALT, ALKPHOS, BILITOT, PROT, ALBUMIN in the last 168 hours.   CBG: Recent Labs  Lab 06/21/21 2002 06/22/21 0810 06/22/21 1141  GLUCAP 193* 213* 249*    Microbiology Studies:   No results found for this or any previous visit (from the past 240 hour(s)).    Radiology Studies:  No results found.   Scheduled Meds:    (feeding supplement) PROSource Plus  30 mL Oral BID BM   cephALEXin  250 mg Oral QHS   docusate sodium  100 mg  Oral  BID   [START ON 06/23/2021] feeding supplement (GLUCERNA SHAKE)  237 mL Oral Q24H   FLUoxetine  40 mg Oral QPM   insulin aspart  0-20 Units Subcutaneous TID WC   insulin aspart  0-5 Units Subcutaneous QHS   insulin aspart  3 Units Subcutaneous TID WC   insulin glargine-yfgn  50 Units Subcutaneous QHS   methocarbamol  1,000 mg Oral BID   multivitamin with minerals  1 tablet Oral Daily   pantoprazole  40 mg Oral Daily   pioglitazone  45 mg Oral Daily   polyethylene glycol  17 g Oral BID   Ensure Max Protein  11 oz Oral Daily   senna  1 tablet Oral Daily   tamsulosin  0.4 mg Oral Daily   warfarin  5 mg Oral ONCE-1600   Warfarin - Pharmacist Dosing Inpatient   Does not apply q1600    Continuous Infusions:    heparin 2,700 Units/hr (06/22/21 1033)      LOS: 10 days     Vernell Leep, MD, [place], [facility]    To contact the attending provider between 7A-7P or the covering provider during after hours 7P-7A, please log into the web site [email or web address] and access using universal [place] password for that web site. If you do not have the password, please call the hospital operator.  06/22/2021, 4:37 PM

## 2021-06-22 NOTE — Progress Notes (Signed)
Nutrition Follow-up  DOCUMENTATION CODES:   Morbid obesity  INTERVENTION:  - continue 30 ml Prosource Plus BID, each supplement provides 100 kcal and 15 grams protein.  - will decrease Glucerna Shake from BID to once/day, each supplement provides 220 kcal and 10 grams of protein. - will order Ensure Max once/day, each supplement provides 150 kcal and 30 grams protein.  - weigh patient today.    NUTRITION DIAGNOSIS:   Inadequate oral intake related to decreased appetite as evidenced by per patient/family report. -ongoing  GOAL:   Patient will meet greater than or equal to 90% of their needs -unmet on average  MONITOR:   PO intake, Supplement acceptance, Labs, Weight trends, Skin  ASSESSMENT:   65 y.o. male with medical history of morbid obesity, anxiety, depression, hx L knee DVT, PE (on chronic Coumadin), GERD, HTN, IDA, cervical spine stenosis, non-specific myalgias/myositis, OSA on CPAP, PVD, polyneuropathy, type 2 DM, HLD, and multiple falls. He presented to the ED due to a fall on 9/15. He was admitted for pain control and to monitor L leg for development of compartment syndrome.  Tech and RN providing care to patient at time of attempted visit. Very limited intakes recorded this admission with most recently documented intakes being 0% of breakfast on 9/19 and 75% of breakfast on 9/20.   He has been accepting Glucerna Shake ~50% of the time offered and Prosource Plus ~75% of the time offered.   He has not been weighed since 9/15. Non-pitting edema to RLE and mild pitting edema to LLE documented in the edema section of flow sheet.   Notes indicate patient is from home but plan is to d/c to SNF at time of discharge.     Labs reviewed; CBGs: 213 and 249 mg/dl, BUN: 50 mg/dl, creatinine: 1.32 mg/dl, Ca: 8.5 mg/dl.  Medications reviewed; 100 mg colace BID, sliding scale novolog, 3 units novolog TID, 50 units semglee/day, 1 tablet multivitamin with minerals/day, 45 mg actos/day,  17 g miralax BID, 1 tablet senokot/day   Diet Order:   Diet Order             Diet heart healthy/carb modified Room service appropriate? Yes; Fluid consistency: Thin  Diet effective now                   EDUCATION NEEDS:   No education needs have been identified at this time  Skin:  Skin Assessment: Skin Integrity Issues: Skin Integrity Issues:: Other (Comment) Other: venous stasis ulcer to L pre-tibial area  Last BM:  9/25 (type 2 x1 and type 4 x1)  Height:   Ht Readings from Last 1 Encounters:  06/11/21 6\' 1"  (1.854 m)    Weight:   Wt Readings from Last 1 Encounters:  06/11/21 (!) 208.7 kg     Estimated Nutritional Needs:  Kcal:  2300-2500 kcal Protein:  115-135 grams Fluid:  >/= 2.4 L/day     Jarome Matin, MS, RD, LDN, CNSC Inpatient Clinical Dietitian RD pager # available in AMION  After hours/weekend pager # available in [facility]

## 2021-06-22 NOTE — Progress Notes (Signed)
Inpatient Rehab Admissions Coordinator:   I reached out to [facility] with Acute rehab to request that PT/OT see Pt. Today and attempt OOB with Pt.     Clemens Catholic, [place], [place] Admissions Coordinator  [phone] ([place]) [phone] (office)

## 2021-06-22 NOTE — Progress Notes (Signed)
Physical Therapy Treatment Patient Details Name: Joseph [name]. MRN: [id number] DOB: 18-Jan-1956 Today's Date: 06/22/2021   History of Present Illness Joseph [name] is a 64 yo male who presnets with increased LLE swelling after recent fall at home. 9/15 pt fell when getting off x-ray table at hospital. X-rays showed a knee dislocation which was confirmed on CT scan as a posterior lateral dislocation; L knee reduced and L KI placed. Lumbar and pelvic xrays negative. 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 Patient able to sit up EOB with 3+ assist for safety and physical mobility. He required min guard at EOB with bil UE support to steady self. Discussed/planned for AP transfer to attempt move to recliner however pt limited by panus girth and if sitting in long sit experiences restrictions with breathing. Pt returned to supine and completed multiple bouts of rolling with Max +2 assist for repositioning of bed pads. Discussed with Rn and charge RN having pt moved to appropriate room with bariatric MaxiSky lift. He has been in hospital for 11 days and still has not been placed in accommodating room. If pt moved to room with appropriate lift will attempt bed>chair transfer, unsafe with other techniques. He remains highly motivated to participate in therapy and improve mobility and at this time continue to recommend CIR follow up to improve mobility to safe level for return home with assist from spouse.    Recommendations for follow up therapy are one component of a multi-disciplinary discharge planning process, led by the attending physician.  Recommendations may be updated based on patient status, additional functional criteria and insurance authorization.  Follow Up Recommendations  CIR     Equipment Recommendations  Other  (comment);Hospital bed (baritric w/c)    Recommendations for Other Services       Precautions / Restrictions Precautions Precautions: Fall Precaution Comments: NO knee ROM Required Braces or Orthoses: Knee Immobilizer - Left Knee Immobilizer - Left: On at all times Restrictions Weight Bearing Restrictions: Yes LLE Weight Bearing: Non weight bearing     Mobility  Bed Mobility Overal bed mobility: Needs Assistance Bed Mobility: Supine to Sit;Sit to Supine Rolling: +2 for physical assistance;+2 for safety/equipment;Max assist   Supine to sit: +2 for physical assistance;+2 for safety/equipment;Max assist;HOB elevated Sit to supine: +2 for physical assistance;+2 for safety/equipment;Max assist;HOB elevated   General bed mobility comments: patient needed +3 for supine to sit on edge of bed on this date with increased time to adjust environment in sitting. pt had to complete 2x as bed rail prevented LE's from abducting enough to provied room for panus to hang between LE's and allwo pt comfortable breathing position. min guard sitting EOB.    Transfers                    Ambulation/Gait                 Stairs             Wheelchair Mobility    Modified Rankin (Stroke Patients Only)       Balance Overall balance assessment: Needs assistance Sitting-balance support: No upper extremity supported Sitting balance-Leahy Scale: Fair Sitting balance - Comments: fair at edge of bed - but min guard for safety. assist needed to support panus and prevent pressure on Lt thigh.  Cognition Arousal/Alertness: Awake/alert Behavior During Therapy: WFL for tasks assessed/performed Overall Cognitive Status: Within Functional Limits for tasks assessed                                 General Comments: wife was present during session      Exercises      General Comments        Pertinent Vitals/Pain  Pain Assessment: 0-10 Pain Score: 10-Worst pain ever Pain Location: Left knee Pain Descriptors / Indicators: Grimacing;Aching;Sore;Discomfort;Guarding Pain Intervention(s): Limited activity within patient's tolerance;Monitored during session;Repositioned;RN gave pain meds during session    Home Living                      Prior Function            PT Goals (current goals can now be found in the care plan section) Acute Rehab PT Goals Patient Stated Goal: to help move as much as possible PT Goal Formulation: With patient/family Time For Goal Achievement: 07/01/21 Potential to Achieve Goals: Good Progress towards PT goals: Progressing toward goals    Frequency    Min 3X/week      PT Plan Current plan remains appropriate    Co-evaluation PT/OT/SLP Co-Evaluation/Treatment: Yes Reason for Co-Treatment: To address functional/ADL transfers;For patient/therapist safety PT goals addressed during session: Mobility/safety with mobility;Balance OT goals addressed during session: ADL's and self-care      AM-PAC PT "6 Clicks" Mobility   Outcome Measure  Help needed turning from your back to your side while in a flat bed without using bedrails?: Total Help needed moving from lying on your back to sitting on the side of a flat bed without using bedrails?: Total Help needed moving to and from a bed to a chair (including a wheelchair)?: Total Help needed standing up from a chair using your arms (e.g., wheelchair or bedside chair)?: Total Help needed to walk in hospital room?: Total Help needed climbing 3-5 steps with a railing? : Total 6 Click Score: 6    End of Session Equipment Utilized During Treatment: Gait belt Activity Tolerance: Patient tolerated treatment well Patient left: in bed;with call bell/phone within reach;with family/visitor present Nurse Communication: Mobility status PT Visit Diagnosis: Other abnormalities of gait and mobility (R26.89);Muscle weakness  (generalized) (M62.81);History of falling (Z91.81)     Time: 9169-4503 PT Time Calculation (min) (ACUTE ONLY): 54 min  Charges:  $Therapeutic Activity: 23-37 mins                     Verner Mould, DPT Acute Rehabilitation Services Office [phone] Pager [phone]    Joseph [name] 06/22/2021, 5:06 PM

## 2021-06-22 NOTE — Progress Notes (Signed)
[place] for Warfarin with heparin bridge Indication: h/o pulmonary embolus  Allergies  Allergen Reactions   Tape     Peels skin on legs   Chlorhexidine Rash    Wipes    Patient Measurements: Height: 6\' 1"  (185.4 cm) Weight: (!) 208.7 kg (460 lb) IBW/kg (Calculated) : 79.9 Heparin Dosing Weight: 132.5 kg  Vital Signs: Temp: 97.8 F (36.6 C) (09/26 0555) Temp Source: Oral (09/26 0555) BP: 160/49 (09/26 0555) Pulse Rate: 76 (09/26 0555)  Labs: Recent Labs    06/20/21 0014 06/20/21 0733 06/20/21 1324 06/21/21 0309 06/22/21 0321  HGB 8.8* 9.4*  --  9.8* 9.7*  HCT 28.5* 31.0*  --  32.3* 32.1*  PLT 505* 537*  --  486* 551*  LABPROT 15.4*  --   --  16.6* 17.4*  INR 1.2  --   --  1.3* 1.4*  HEPARINUNFRC 0.12* 0.52 0.42 0.39 0.45  CREATININE 1.32*  --   --   --   --      Estimated Creatinine Clearance: 105.1 mL/min (A) (by C-G formula based on SCr of 1.32 mg/dL (H)).   Medical History: Past Medical History:  Diagnosis Date   Anxiety state, unspecified    Aortic atherosclerosis ([place]) 06/12/2021   Depression    DVT (deep venous thrombosis) ([place]) 2004   Left knee   Edema    Esophageal reflux    occ   Essential hypertension, benign    Essential hypertension, benign    Falls frequently    History of iron deficiency anemia    Morbid obesity (HCC)    Myalgia and myositis, unspecified    BACK AND LEGS   OSA on CPAP    Peripheral vascular disease, unspecified (HCC)    Personal history of PE (pulmonary embolism) 2004   Polyneuropathy in diabetes(357.2)    Poor venous access    PT STATES DIFFICULT TO DRAW BLOOD FROM HIS VEINS   Pure hypercholesterolemia    Shortness of breath    DUE TO WEIGHT AND LOSS OF MOBILITY   Type II or unspecified type diabetes mellitus with neurological manifestations, not stated as uncontrolled(250.60)    Type II or unspecified type diabetes mellitus without mention of complication, not stated as  uncontrolled    Type II or unspecified type diabetes mellitus without mention of complication, uncontrolled    Varicose veins of lower extremities with inflammation    Wears glasses    Wound infection after surgery    required hospitalization    Medications:  PTA warfarin dosing:  Take 5 mg in the evening on Sun, Mon, Wed, and Fri. Take 7.5 mg in the evening on Tue, Thur, and Sat [place] Must   INR was SUPRAtherapeutic (6.2) on admission  Assessment: 65 y/o M on lifelong anticoagulation with warfarin for h/o PE, body habitus, and sedentary lifestyle admitted with left knee dislocation and effusion concerning for hematoma. On admission - warfarin was held, vitamin K was given for supratherapeutic INR, and pt was transfused. Ortho following - no surgical intervention planned.   Pharmacy consulted to resume warfarin on 9/23 with heparin bridge while INR subtherapeutic.   Significant Events: -Warfarin held from 9/15 - 9/22 -Vitamin K given on 9/15, 9/16, and 9/17  Today, 06/22/21 INR = 1.4 remains subtherapeutic as expected after holding warfarin and reversing with vitamin K but increased slightly. Anticipate it taking 3-5 days to see reflection of resuming warfarin in INR HL = 0.45 remains therapeutic on heparin  infusion of 2700 units/hr CBC: Hgb low but stable; Plt elevated No bleeding or infusion related issues per RN  Goal of Therapy:  INR 2-3 Heparin level 0.3-0.7 units/ml Monitor platelets by anticoagulation protocol: Yes   Plan:  Continue heparin infusion at current rate of 2700 units/hr Warfarin 5 mg PO once today HL, CBC, INR daily Monitor for signs of bleeding  Napoleon Form 06/22/21 8:06 AM

## 2021-06-23 DIAGNOSIS — S83105A Unspecified dislocation of left knee, initial encounter: Secondary | ICD-10-CM | POA: Diagnosis not present

## 2021-06-23 LAB — CBC
HCT: 30.4 % — ABNORMAL LOW (ref 39.0–52.0)
Hemoglobin: 9 g/dL — ABNORMAL LOW (ref 13.0–17.0)
MCH: 28.4 pg (ref 26.0–34.0)
MCHC: 29.6 g/dL — ABNORMAL LOW (ref 30.0–36.0)
MCV: 95.9 fL (ref 80.0–100.0)
Platelets: 523 10*3/uL — ABNORMAL HIGH (ref 150–400)
RBC: 3.17 MIL/uL — ABNORMAL LOW (ref 4.22–5.81)
RDW: 16.1 % — ABNORMAL HIGH (ref 11.5–15.5)
WBC: 8 10*3/uL (ref 4.0–10.5)
nRBC: 0.6 % — ABNORMAL HIGH (ref 0.0–0.2)

## 2021-06-23 LAB — BASIC METABOLIC PANEL
Anion gap: 5 (ref 5–15)
BUN: 40 mg/dL — ABNORMAL HIGH (ref 8–23)
CO2: 27 mmol/L (ref 22–32)
Calcium: 8.5 mg/dL — ABNORMAL LOW (ref 8.9–10.3)
Chloride: 105 mmol/L (ref 98–111)
Creatinine, Ser: 1.26 mg/dL — ABNORMAL HIGH (ref 0.61–1.24)
GFR, Estimated: >60 mL/min (ref >60)
Glucose, Bld: 171 mg/dL — ABNORMAL HIGH (ref 70–99)
Potassium: 5.1 mmol/L (ref 3.5–5.1)
Sodium: 137 mmol/L (ref 135–145)

## 2021-06-23 LAB — GLUCOSE, CAPILLARY
Glucose-Capillary: 173 mg/dL — ABNORMAL HIGH (ref 70–99)
Glucose-Capillary: 173 mg/dL — ABNORMAL HIGH (ref 70–99)
Glucose-Capillary: 164 mg/dL — ABNORMAL HIGH (ref 70–99)
Glucose-Capillary: 103 mg/dL — ABNORMAL HIGH (ref 70–99)
Glucose-Capillary: 167 mg/dL — ABNORMAL HIGH (ref 70–99)

## 2021-06-23 LAB — PROTIME-INR
INR: 1.7 — ABNORMAL HIGH (ref 0.8–1.2)
Prothrombin Time: 20.3 seconds — ABNORMAL HIGH (ref 11.4–15.2)

## 2021-06-23 LAB — HEPARIN LEVEL (UNFRACTIONATED): Heparin Unfractionated: 0.63 IU/mL (ref 0.30–0.70)

## 2021-06-23 MED ORDER — WARFARIN SODIUM 5 MG PO TABS
5.0000 mg | ORAL_TABLET | Freq: Once | ORAL | Status: AC
  Administered 2021-06-23: 5 mg via ORAL
  Filled 2021-06-23: qty 1

## 2021-06-23 NOTE — Progress Notes (Signed)
Inpatient Rehab Admissions Coordinator:   I do not have a bed for this pt. On CIR at this time. Note PT/TOT worked with Pt. Yesterday but were unable to get Pt. Out of bed. Pt. Is in a room without a maxisky, which limits his ability to participate. Per rehab MD, pt. Needs to at least be getting out of bed,  attempting transfers, and attempting to  weight bear on his good leg to indicate that he can tolerate 3 hours of intensive therapies every day.Even if Pt. Is able to tolerate more, I will still have to get insurance auth from [facility] (they are likely to deny for Pt.'s diagnosis).I have discussed barriers to admission with TOC and acute PT.   Clemens Catholic, [place], [place] Admissions Coordinator  [phone] ([place]) [phone] (office)

## 2021-06-23 NOTE — Plan of Care (Signed)
  Problem: Education: Goal: Knowledge of General Education information will improve Description: Including pain rating scale, medication(s)/side effects and non-pharmacologic comfort measures Outcome: Progressing   Problem: Clinical Measurements: Goal: Diagnostic test results will improve Outcome: Progressing Goal: Respiratory complications will improve Outcome: Progressing Goal: Cardiovascular complication will be avoided Outcome: Progressing   Problem: Nutrition: Goal: Adequate nutrition will be maintained Outcome: Progressing   Problem: Coping: Goal: Level of anxiety will decrease Outcome: Progressing   

## 2021-06-23 NOTE — Progress Notes (Signed)
PROGRESS NOTE   Joseph [name].  DVV:[id number]    DOB: 1956/02/18    DOA: 06/11/2021  PCP: Donald Prose, MD   I have briefly reviewed patients previous medical records in [facility].  Chief Complaint  Patient presents with   Leg Swelling   Leg Pain    left    Brief Narrative:   Joseph Cheever. is a 65 y.o. male with PMH morbid obesity, anxiety/depression, hx left knee DVT, PE (on chronic Coumadin), GERD, HTN, IDA, nonspecific myalgias/myositis, OSA on CPAP, PVD, polyneuropathy, DMII, HLD, multiple falls who presented to the ER after another fall.    He was initially seen in the ER on 06/08/2021 after a fall at home.  He had fallen getting out of the shower. He underwent imaging of CT C-spine, CT head, hip/pelvis x-rays, and left knee x-ray.  There were no acute findings and he was discharged home. He had attempted to get out of bed and again had another fall. Appears that his left knee may have been dislocated from this fall and was replaced back in socket by EMS. In the ER he then had another fall out of bed with left knee dislocation. Ortho was paged and performed reduction under conscious sedation.   He again underwent multiple imaging studies in the ER on 9/15. No visible acute fractures but body habitus does limit some of the studies. His CT A/P was motion degraded and was intended to evaluate for RP bleed. Imaging studies do show a left knee effusion which is a presumed large hematoma due to his supratherapeutic INR on admission which was reversed some with Vit K.    He was admitted for further pain control and monitoring of left leg for any development of compartment syndrome and further evaluation as needed.  Currently stable pending placement (site unclear: CIR versus SNF) and IV heparin bridging to Coumadin.   Assessment & Plan:  Principal Problem:   Left knee dislocation, initial encounter Active Problems:   Pure hypercholesterolemia   Essential hypertension, benign    OSA (obstructive sleep apnea)   Hx pulmonary embolism   Esophageal reflux   Diabetes mellitus type 2, uncontrolled (HCC)   Diabetic neuropathy (HCC)   Acute renal failure superimposed on stage 3b chronic kidney disease (HCC)   Normocytic anemia   Class 3 obesity   Aortic atherosclerosis (HCC)   Coronary atherosclerosis   Effusion of left knee   Left knee dislocation   Left knee dislocation - s/p fall at home x 2 week of admission and again in ER on 9/15 - xray shows fracture fragments as well (presumed from proximal tibia per xray read) -As per extensive communication with Dr. Erlinda Hong, orthopedics on 9/20: No weightbearing to LLE, no ROM, knee immobilizer at all times.  He indicated that the knee is very unstable and not a candidate for surgery or external fixation due to body habitus and leg wounds.   - PT and OT have directly communicated with Dr. Erlinda Hong to clarify his left lower extremity restrictions and what is doable and what does not. - Difficult disposition due to very morbid obesity, nonweightbearing on left lower extremity -Examined left leg wounds on 9/22, pictures as below. -Clinically left leg findings no longer consistent with infection i.e. cellulitis.  As per pharmacy review, appears to be on chronic Keflex for urological indication. - Some blood oozing from superficial open wounds/ruptured blister-monitor closely while on IV heparin/Coumadin. - eventually needs MRI knee once more  stable -As per discussion with TOC team, CIR is evaluating to see if he is appropriate for CIR. CIR continues to follow but no decision yet and indicate that he has to be able to demonstrate more activity so that he can tolerate 3 hours of intensive therapies every day at CIR and moreover his insurance also has to approve CIR admission.  Patient is transferring to a room that has [place].   Effusion of left knee - concerned for large hematoma given amount of bruising and swelling with INR 6.2 on  admission - Coumadin was held for several days and INR was reversed with vitamin K -No clinical worsening.  Improving. -No surgical intervention at this time per Ortho   Normocytic anemia - likely mixed from ACD and hematoma - baseline Hgb around 12 g/dL - admitted with Hgb 9.9 g/dL with large LLE bruising and presumed hematoma/effusion - now is s/p 2 units PRBC total for admission and 1 unit FFP -Hemoglobin stable in the mid 9 g range.   Acute renal failure superimposed on stage 3b chronic kidney disease ([place]) - patient has history of CKD3a. Baseline creat ~ 1.4 -1.5, eGFR 51 -56 - may have developed an ATN from hypoperfusion during blood loss - too difficult to obtain renal u/s at this time -Briefly on IV fluids.  Now discontinued. -Creatinine has gradually improved and now at baseline.  Avoid nephrotoxic's and periodically monitor BMP.  Creatinine 1.26. - continue holding BP meds  Mild hyperkalemia -Resolved.  Potassium high normal.  Follow BMP in AM.    Hx pulmonary embolism - on chronic coumadin at home.  On hold on admission due to suspected hematoma of left knee, supratherapeutic INR which was reversed with vitamin K -Communicated with Dr.Xu, orthopedics on 9/23 was okay to resume Coumadin. - As per discussion with patient, he reports history of DVT/PE and assigned to lifelong anticoagulation due to body habitus and relative sedentary lifestyle. - Initiated IV heparin bridging with Coumadin per pharmacy.  INR 1.7.  He may be therapeutic in the next 1 to 2 days.   Coronary atherosclerosis - no CP - holding statin.   Body mass index is 60.69 kg/m./Very morbid obesity - Complicates overall prognosis and care   Diabetic neuropathy (Joseph [name]) - continue current pain regimen.  Controlled.   Diabetes mellitus type 2, uncontrolled ([place]) -Reasonably controlled on current dose of Semglee, resistant SSI with bedtime scale, mealtime NovoLog 3 units and Actos.    Esophageal reflux -  continue PPI   OSA (obstructive sleep apnea) - continue qhs CPAP   Essential hypertension, benign - continue current regimen    Pure hypercholesterolemia - hold statin for now    Constipation Continue bowel regimen.  Has had a couple of BMs.  Continue MiraLAX and senna.  Nutritional Status Nutrition Problem: Inadequate oral intake Etiology: decreased appetite Signs/Symptoms: per patient/family report Interventions: Glucerna shake, MVI, Premier Protein, Prostat   DVT prophylaxis: SCDs Start: 06/11/21 2153     Code Status: Full Code Family Communication: Discussed with patient spouse at bedside. Disposition:  Status is: Inpatient  Remains inpatient appropriate because:Inpatient level of care appropriate due to severity of illness  Dispo: The patient is from:  Home              Anticipated d/c is to:  TBD              Patient currently is not medically stable to d/c.   Difficult to place patient Yes.  May be  DTP due to morbid obesity, nonweightbearing on left lower extremity etc.        Consultants:   Orthopedics  Procedures:   Left knee reduction, 06/11/21  Antimicrobials:    Anti-infectives (From admission, onward)    Start     Dose/Rate Route Frequency Ordered Stop   06/11/21 2230  cephALEXin (KEFLEX) capsule 250 mg        250 mg Oral Daily at bedtime 06/11/21 2221           Subjective:  Stated that he worked with therapy yesterday and it was rough.  Had some left lower extremity pain, relieved by pain meds and slept well last night.  No new complaints reported.  Motivated to work with therapies and mobilize.  Objective:   Vitals:   06/22/21 0555 06/22/21 1422 06/23/21 0300 06/23/21 1058  BP: (!) 160/49 (!) 127/46 (!) 143/51 (!) 126/37  Pulse: 76 81 77 76  Resp: 17 20 16 20   Temp: 97.8 F (36.6 C) 98.6 F (37 C) 98.8 F (37.1 C) 98.5 F (36.9 C)  TempSrc: Oral Oral Oral   SpO2: 95% (!) 89% 92% 95%  Weight:      Height:        General exam:  Middle-age male, moderately built and morbidly obese lying comfortably supine in bed without distress. Respiratory system: Clear to auscultation.  No increased work of breathing. Cardiovascular system: S1 & S2 heard, RRR. No JVD, murmurs, rubs, gallops or clicks. No pedal edema.   Gastrointestinal system: Abdomen is nondistended, soft and nontender. No organomegaly or masses felt. Normal bowel sounds heard. Central nervous system: Alert and oriented. No focal neurological deficits. Extremities: Symmetric 5 x 5 power except for left lower extremity which was not assessed for power.  Left lower extremity with dressing clean and dry and has knee immobilizer. Skin: No rashes, lesions or ulcers Psychiatry: Judgement and insight appear normal. Mood & affect appropriate.   Pictures below from 06/18/2021.      Data Reviewed:   I have personally reviewed following labs and imaging studies   CBC: Recent Labs  Lab 06/17/21 0325 06/19/21 0334 06/21/21 0309 06/22/21 0321 06/23/21 0411  WBC 8.0   < > 7.6 7.6 8.0  NEUTROABS 5.1  --   --   --   --   HGB 8.7*   < > 9.8* 9.7* 9.0*  HCT 28.0*   < > 32.3* 32.1* 30.4*  MCV 93.0   < > 95.8 94.4 95.9  PLT 356   < > 486* 551* 523*   < > = values in this interval not displayed.    Basic Metabolic Panel: Recent Labs  Lab 06/17/21 0325 06/18/21 0340 06/19/21 0334 06/20/21 0014 06/23/21 0411  NA 136   < > 136 136 137  K 4.7   < > 4.8 5.0 5.1  CL 104   < > 104 104 105  CO2 24   < > 22 25 27   GLUCOSE 181*   < > 186* 145* 171*  BUN 70*   < > 60* 50* 40*  CREATININE 1.65*   < > 1.35* 1.32* 1.26*  CALCIUM 8.1*   < > 8.5* 8.5* 8.5*  MG 2.6*  --   --   --   --    < > = values in this interval not displayed.    Liver Function Tests: No results for input(s): AST, ALT, ALKPHOS, BILITOT, PROT, ALBUMIN in the last 168 hours.   CBG: Recent  Labs  Lab 06/23/21 1055 06/23/21 1240 06/23/21 1714  GLUCAP 173* 164* 103*    Microbiology Studies:    No results found for this or any previous visit (from the past 240 hour(s)).    Radiology Studies:  No results found.   Scheduled Meds:    (feeding supplement) PROSource Plus  30 mL Oral BID BM   cephALEXin  250 mg Oral QHS   docusate sodium  100 mg Oral BID   feeding supplement (GLUCERNA SHAKE)  237 mL Oral Q24H   FLUoxetine  40 mg Oral QPM   insulin aspart  0-20 Units Subcutaneous TID WC   insulin aspart  0-5 Units Subcutaneous QHS   insulin aspart  3 Units Subcutaneous TID WC   insulin glargine-yfgn  50 Units Subcutaneous QHS   methocarbamol  1,000 mg Oral BID   multivitamin with minerals  1 tablet Oral Daily   pantoprazole  40 mg Oral Daily   pioglitazone  45 mg Oral Daily   polyethylene glycol  17 g Oral BID   Ensure Max Protein  11 oz Oral Daily   senna  1 tablet Oral Daily   tamsulosin  0.4 mg Oral Daily   warfarin  5 mg Oral ONCE-1600   Warfarin - Pharmacist Dosing Inpatient   Does not apply q1600    Continuous Infusions:    heparin 2,600 Units/hr (06/23/21 1419)      LOS: 11 days     Vernell Leep, MD, [place], [facility]. [facility]    To contact the attending provider between 7A-7P or the covering provider during after hours 7P-7A, please log into the web site [email or web address] and access using universal [place] password for that web site. If you do not have the password, please call the hospital operator.  06/23/2021, 5:35 PM

## 2021-06-23 NOTE — TOC Progression Note (Signed)
Transition of Care (TOC) - Progression Note    Patient Details  Name: Joseph [name]. MRN: [id number] Date of Birth: 06-Dec-1955  Transition of Care [facility]) CM/SW Contact  Ross Ludwig, [place] Phone Number: 06/23/2021, 1:14 PM  Clinical Narrative:     CSW was informed by inpatient rehab that patient needs to show that he can tolerate 3 hours of therapy a day.  Per charge nurse, patient will be transferring to a room that has [facility] which can help him participate with therapy.  CSW updated CIR, per CIR patient will have to get approval from patient's insurance company.  CSW to continue to work on placement for patient.        Expected Discharge Plan and Services                                                 Social Determinants of Health (SDOH) Interventions    Readmission Risk Interventions No flowsheet data found.

## 2021-06-23 NOTE — Progress Notes (Signed)
[place] for Warfarin with heparin bridge Indication: h/o pulmonary embolus  Allergies  Allergen Reactions   Tape     Peels skin on legs   Chlorhexidine Rash    Wipes    Patient Measurements: Height: 6\' 1"  (185.4 cm) Weight: (!) 208.7 kg (460 lb) IBW/kg (Calculated) : 79.9 Heparin Dosing Weight: 132.5 kg  Vital Signs: Temp: 98.8 F (37.1 C) (09/27 0300) Temp Source: Oral (09/27 0300) BP: 143/51 (09/27 0300) Pulse Rate: 77 (09/27 0300)  Labs: Recent Labs    06/21/21 0309 06/22/21 0321 06/23/21 0411  HGB 9.8* 9.7* 9.0*  HCT 32.3* 32.1* 30.4*  PLT 486* 551* 523*  LABPROT 16.6* 17.4* 20.3*  INR 1.3* 1.4* 1.7*  HEPARINUNFRC 0.39 0.45 0.63  CREATININE  --   --  1.26*     Estimated Creatinine Clearance: 110.1 mL/min (A) (by C-G formula based on SCr of 1.26 mg/dL (H)).   Medical History: Past Medical History:  Diagnosis Date   Anxiety state, unspecified    Aortic atherosclerosis ([place]) 06/12/2021   Depression    DVT (deep venous thrombosis) ([place]) 2004   Left knee   Edema    Esophageal reflux    occ   Essential hypertension, benign    Essential hypertension, benign    Falls frequently    History of iron deficiency anemia    Morbid obesity (HCC)    Myalgia and myositis, unspecified    BACK AND LEGS   OSA on CPAP    Peripheral vascular disease, unspecified (HCC)    Personal history of PE (pulmonary embolism) 2004   Polyneuropathy in diabetes(357.2)    Poor venous access    PT STATES DIFFICULT TO DRAW BLOOD FROM HIS VEINS   Pure hypercholesterolemia    Shortness of breath    DUE TO WEIGHT AND LOSS OF MOBILITY   Type II or unspecified type diabetes mellitus with neurological manifestations, not stated as uncontrolled(250.60)    Type II or unspecified type diabetes mellitus without mention of complication, not stated as uncontrolled    Type II or unspecified type diabetes mellitus without mention of complication,  uncontrolled    Varicose veins of lower extremities with inflammation    Wears glasses    Wound infection after surgery    required hospitalization    Medications:  PTA warfarin dosing:  Take 5 mg in the evening on Sun, Mon, Wed, and Fri. Take 7.5 mg in the evening on Tue, Thur, and Sat [place] Must   INR was SUPRAtherapeutic (6.2) on admission  Assessment: 65 y/o M on lifelong anticoagulation with warfarin for h/o PE, body habitus, and sedentary lifestyle admitted with left knee dislocation and effusion concerning for hematoma. On admission - warfarin was held, vitamin K was given for supratherapeutic INR, and pt was transfused. Ortho following - no surgical intervention planned.   Pharmacy consulted to resume warfarin on 9/23 with heparin bridge while INR subtherapeutic.   Significant Events: -Warfarin held from 9/15 - 9/22 -Vitamin K given on 9/15, 9/16, and 9/17  Today, 06/23/21 INR = 1.7 remains subtherapeutic as expected after holding warfarin and reversing with vitamin K but increased. Anticipate it taking 3-5 days to see reflection of resuming warfarin in INR HL = 0.63 remains therapeutic on heparin infusion of 2700 units/hr but steadily increasing towards upper end of goal range CBC: Hgb low but stable; Plt elevated No further bleeding reported from wounds or elsewhere per RN  Goal of Therapy:  INR 2-3  Heparin level 0.3-0.7 units/ml Monitor platelets by anticoagulation protocol: Yes   Plan:  Decrease heparin infusion slightly to 2600 units/hr Warfarin 5 mg PO once today Would recommend dosing less than or equal to warfarin 5 mg daily upon discharge d/t supratherapeutic INR on previous dose resulting in blood loss. Recommend f/u INR 1 week at longest HL, CBC, INR daily Monitor for signs of bleeding  Napoleon Form 06/23/21 6:58 AM

## 2021-06-23 NOTE — Progress Notes (Signed)
Patient placed self on home cpap.

## 2021-06-24 DIAGNOSIS — I10 Essential (primary) hypertension: Secondary | ICD-10-CM

## 2021-06-24 DIAGNOSIS — E669 Obesity, unspecified: Secondary | ICD-10-CM | POA: Diagnosis not present

## 2021-06-24 DIAGNOSIS — E1165 Type 2 diabetes mellitus with hyperglycemia: Secondary | ICD-10-CM | POA: Diagnosis not present

## 2021-06-24 DIAGNOSIS — S83105A Unspecified dislocation of left knee, initial encounter: Secondary | ICD-10-CM | POA: Diagnosis not present

## 2021-06-24 LAB — GLUCOSE, CAPILLARY
Glucose-Capillary: 199 mg/dL — ABNORMAL HIGH (ref 70–99)
Glucose-Capillary: 229 mg/dL — ABNORMAL HIGH (ref 70–99)
Glucose-Capillary: 187 mg/dL — ABNORMAL HIGH (ref 70–99)
Glucose-Capillary: 175 mg/dL — ABNORMAL HIGH (ref 70–99)

## 2021-06-24 LAB — CBC
HCT: 32.1 % — ABNORMAL LOW (ref 39.0–52.0)
Hemoglobin: 9.6 g/dL — ABNORMAL LOW (ref 13.0–17.0)
MCH: 28.4 pg (ref 26.0–34.0)
MCHC: 29.9 g/dL — ABNORMAL LOW (ref 30.0–36.0)
MCV: 95 fL (ref 80.0–100.0)
Platelets: 539 10*3/uL — ABNORMAL HIGH (ref 150–400)
RBC: 3.38 MIL/uL — ABNORMAL LOW (ref 4.22–5.81)
RDW: 16.2 % — ABNORMAL HIGH (ref 11.5–15.5)
WBC: 7.1 10*3/uL (ref 4.0–10.5)
nRBC: 0.4 % — ABNORMAL HIGH (ref 0.0–0.2)

## 2021-06-24 LAB — COMPREHENSIVE METABOLIC PANEL
ALT: 16 U/L (ref 0–44)
AST: 16 U/L (ref 15–41)
Albumin: 2.4 g/dL — ABNORMAL LOW (ref 3.5–5.0)
Alkaline Phosphatase: 89 U/L (ref 38–126)
Anion gap: 6 (ref 5–15)
BUN: 37 mg/dL — ABNORMAL HIGH (ref 8–23)
CO2: 26 mmol/L (ref 22–32)
Calcium: 8.5 mg/dL — ABNORMAL LOW (ref 8.9–10.3)
Chloride: 105 mmol/L (ref 98–111)
Creatinine, Ser: 1.24 mg/dL (ref 0.61–1.24)
GFR, Estimated: >60 mL/min (ref >60)
Glucose, Bld: 235 mg/dL — ABNORMAL HIGH (ref 70–99)
Potassium: 5.2 mmol/L — ABNORMAL HIGH (ref 3.5–5.1)
Sodium: 137 mmol/L (ref 135–145)
Total Bilirubin: 0.9 mg/dL (ref 0.3–1.2)
Total Protein: 5.9 g/dL — ABNORMAL LOW (ref 6.5–8.1)

## 2021-06-24 LAB — PROTIME-INR
INR: 1.9 — ABNORMAL HIGH (ref 0.8–1.2)
Prothrombin Time: 21.4 seconds — ABNORMAL HIGH (ref 11.4–15.2)

## 2021-06-24 LAB — CK: Total CK: 37 U/L — ABNORMAL LOW (ref 49–397)

## 2021-06-24 LAB — POTASSIUM: Potassium: 4.7 mmol/L (ref 3.5–5.1)

## 2021-06-24 LAB — HEPARIN LEVEL (UNFRACTIONATED): Heparin Unfractionated: 0.45 IU/mL (ref 0.30–0.70)

## 2021-06-24 MED ORDER — OXYCODONE HCL 5 MG PO TABS
10.0000 mg | ORAL_TABLET | Freq: Four times a day (QID) | ORAL | Status: DC | PRN
  Administered 2021-06-24 – 2021-07-01 (×14): 10 mg via ORAL
  Filled 2021-06-24 (×15): qty 2

## 2021-06-24 MED ORDER — OXYCODONE HCL 5 MG PO TABS
10.0000 mg | ORAL_TABLET | Freq: Every day | ORAL | Status: DC | PRN
  Administered 2021-06-25 – 2021-06-30 (×3): 10 mg via ORAL
  Filled 2021-06-24 (×2): qty 2

## 2021-06-24 MED ORDER — WARFARIN SODIUM 5 MG PO TABS
5.0000 mg | ORAL_TABLET | Freq: Once | ORAL | Status: AC
  Administered 2021-06-24: 5 mg via ORAL
  Filled 2021-06-24: qty 1

## 2021-06-24 NOTE — Progress Notes (Addendum)
PROGRESS NOTE    Joseph [name].  Joseph [name]:[id number] DOB: 09-16-1956 DOA: 06/11/2021 PCP: Donald Prose, MD   Brief Narrative: Joseph [name]. is a 65 y.o. 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  [facility] patient presents secondary to fall at home resulting in left knee pain and found to have a left knee dislocation with associated left knee effusion with concern for possible hematoma in setting of supratherapeutic INR.  Orthopedic surgery was consulted and reduced patient's knee but recommended no surgical management secondary to patient's body habitus.  Patient required INR reversal with vitamin K and Coumadin was held secondary to concern for active bleeding.  PT/OT recommending rehab.  Coumadin restarted with heparin drip bridge.   Assessment & Plan:   Principal Problem:   Left knee dislocation, initial encounter Active Problems:   Pure hypercholesterolemia   Essential hypertension, benign   OSA (obstructive sleep apnea)   Hx pulmonary embolism   Esophageal reflux   Diabetes mellitus type 2, uncontrolled (HCC)   Diabetic neuropathy (HCC)   Acute renal failure superimposed on stage 3b chronic kidney disease (HCC)   Normocytic anemia   Class 3 obesity   Aortic atherosclerosis (HCC)   Coronary atherosclerosis   Effusion of left knee   Left knee dislocation   Left knee dislocation Secondary to fall. Associated fragments concerning for fracture seen on x-ray. Orthopedic surgery was consulted and reduced patient's knee with recommendations for nonweightbearing status of left leg in addition to strict use of an immobilizer and no ROM. No surgery recommended secondary to body habitus and leg wounds. PT/OT evaluating for rehab; SNF vs CIR.  Left knee effusion Possible left knee hematoma In setting of fall  and trauma to knee while on Coumadin with supratherapeutic INR. Improving. No surgical intervention.  Normocytic anemia In setting of CKD in addition to likely hematoma. Patient received 2 units of PRBC and 1 unit of FFP this admission. Hemoglobin stable.  AKI on CKD stage IIIb Baseline creatinine of about 1.4-1.5. Creatinine of 1.50 on admission with peak creatinine of 2.87. Possible AKI from ATN related to hypoperfusion from blood loss. Improved with IV fluids.  Hyperkalemia Resolved.  History of pulmonary embolism Patient is on Coumadin as an outpatient. Requiring bridge secondary to cessation of Coumadin and vitamin K INR reversal on admission for acute hemorrhage -Continue Coumadin per pharmacy (Goal INR of 2-3) -Continue heparin IV for bridge  CAD Asymptomatic.  Diabetic neuropathy Diabetes mellitus, type 2 Hemoglobin A1C of 6.1%. Patient is on metformin, Actos and Lantus as an outpatient. -Continue Semglee 50 units qHS, Novolog 3 units TID, SSI, Actos  GERD -Continue Protonix  OSA -Continue CPAP qHS  Primary hypertension Patient is on quinapril and amlodipine as an outpatient which have been held this admission. Blood pressure uncontrolled. -Restart home amlodipine  Hyperlipidemia Patient is on pravastatin as an outpatient which is held currently.  Constipation In setting of opiate use. -Continue Colace, Senokot, Miralax  Obesity Body mass index is 60.69 kg/m.    DVT prophylaxis: Heparin IV/Coumadin Code Status:   Code Status: Full Code Family Communication: None at bedside Disposition Plan: Discharge to CIR vs SNF when bed is available likely in 24-48 hours   Consultants:  Orthopedic surgery  Procedures:  LEFT KNEE REDUCTION  Antimicrobials: Keflex    Subjective: No good strength in left leg. No other  issues noted.  Objective: Vitals:   06/24/21 0436 06/24/21 0441 06/24/21 1129 06/24/21 1505  BP: (!) 183/54 (!) 167/53 (!) 160/48   Pulse: 81  75 82   Resp:   17   Temp: 97.7 F (36.5 C)  98.1 F (36.7 C)   TempSrc: Oral  Oral   SpO2: 96%  97% 96%  Weight:      Height:        Intake/Output Summary (Last 24 hours) at 06/24/2021 1741 Last data filed at 06/24/2021 1700 Gross per 24 hour  Intake 1012.72 ml  Output 1150 ml  Net -137.28 ml   Filed Weights   06/11/21 1326  Weight: (!) 208.7 kg    Examination:  General exam: Appears calm and comfortable. Morbidly obese Respiratory system: Clear to auscultation. Respiratory effort normal. Cardiovascular system: S1 & S2 heard, RRR. No murmurs, rubs, gallops or clicks. Gastrointestinal system: Abdomen is nondistended, soft and nontender. No organomegaly or masses felt. Normal bowel sounds heard. Central nervous system: Alert and oriented. No focal neurological deficits. Musculoskeletal: left leg in knee immobilizer No calf tenderness Skin: No cyanosis. No rashes Psychiatry: Judgement and insight appear normal. Mood & affect appropriate.     Data Reviewed: I have personally reviewed following labs and imaging studies  CBC Lab Results  Component Value Date   WBC 7.1 06/24/2021   RBC 3.38 (L) 06/24/2021   HGB 9.6 (L) 06/24/2021   HCT 32.1 (L) 06/24/2021   MCV 95.0 06/24/2021   MCH 28.4 06/24/2021   PLT 539 (H) 06/24/2021   MCHC 29.9 (L) 06/24/2021   RDW 16.2 (H) 06/24/2021   LYMPHSABS 1.7 06/17/2021   MONOABS 0.7 06/17/2021   EOSABS 0.4 06/17/2021   BASOSABS 0.1 84/16/6063     Last metabolic panel Lab Results  Component Value Date   NA 137 06/24/2021   K 4.7 06/24/2021   CL 105 06/24/2021   CO2 26 06/24/2021   BUN 37 (H) 06/24/2021   CREATININE 1.24 06/24/2021   GLUCOSE 235 (H) 06/24/2021   GFRNONAA >60 06/24/2021   GFRAA 51 (L) 04/05/2020   CALCIUM 8.5 (L) 06/24/2021   PHOS 2.8 04/05/2020   PROT 5.9 (L) 06/24/2021   ALBUMIN 2.4 (L) 06/24/2021   BILITOT 0.9 06/24/2021   ALKPHOS 89 06/24/2021   AST 16 06/24/2021   ALT 16 06/24/2021   ANIONGAP 6  06/24/2021    CBG (last 3)  Recent Labs    06/24/21 0727 06/24/21 1124 06/24/21 1706  GLUCAP 199* 229* 187*     GFR: Estimated Creatinine Clearance: 111.9 mL/min (by C-G formula based on SCr of 1.24 mg/dL).  Coagulation Profile: Recent Labs  Lab 06/20/21 0014 06/21/21 0309 06/22/21 0321 06/23/21 0411 06/24/21 0507  INR 1.2 1.3* 1.4* 1.7* 1.9*    No results found for this or any previous visit (from the past 240 hour(s)).      Radiology Studies: No results found.      Scheduled Meds:  (feeding supplement) PROSource Plus  30 mL Oral BID BM   cephALEXin  250 mg Oral QHS   docusate sodium  100 mg Oral BID   feeding supplement (GLUCERNA SHAKE)  237 mL Oral Q24H   FLUoxetine  40 mg Oral QPM   insulin aspart  0-20 Units Subcutaneous TID WC   insulin aspart  0-5 Units Subcutaneous QHS   insulin aspart  3 Units Subcutaneous TID WC   insulin glargine-yfgn  50 Units Subcutaneous QHS   methocarbamol  1,000 mg Oral  BID   multivitamin with minerals  1 tablet Oral Daily   pantoprazole  40 mg Oral Daily   pioglitazone  45 mg Oral Daily   polyethylene glycol  17 g Oral BID   Ensure Max Protein  11 oz Oral Daily   senna  1 tablet Oral Daily   tamsulosin  0.4 mg Oral Daily   Warfarin - Pharmacist Dosing Inpatient   Does not apply q1600   Continuous Infusions:  heparin 2,600 Units/hr (06/24/21 0951)     LOS: 12 days     Cordelia Poche, MD [facility] 06/24/2021, 5:41 PM  If 7PM-7AM, please contact night-coverage [email or web address]

## 2021-06-24 NOTE — Progress Notes (Signed)
[place] for Warfarin with heparin bridge Indication: h/o pulmonary embolus  Allergies  Allergen Reactions   Tape     Peels skin on legs   Chlorhexidine Rash    Wipes    Patient Measurements: Height: 6\' 1"  (185.4 cm) Weight: (!) 208.7 kg (460 lb) IBW/kg (Calculated) : 79.9 Heparin Dosing Weight: 132.5 kg  Vital Signs: Temp: 97.7 F (36.5 C) (09/28 0436) Temp Source: Oral (09/28 0436) BP: 167/53 (09/28 0441) Pulse Rate: 75 (09/28 0441)  Labs: Recent Labs    06/22/21 0321 06/23/21 0411 06/24/21 0507  HGB 9.7* 9.0* 9.6*  HCT 32.1* 30.4* 32.1*  PLT 551* 523* 539*  LABPROT 17.4* 20.3* 21.4*  INR 1.4* 1.7* 1.9*  HEPARINUNFRC 0.45 0.63 0.45  CREATININE  --  1.26* 1.24  CKTOTAL  --   --  37*     Estimated Creatinine Clearance: 111.9 mL/min (by C-G formula based on SCr of 1.24 mg/dL).  Medications:  PTA warfarin dosing:  Take 5 mg in the evening on Sun, Mon, Wed, and Fri. Take 7.5 mg in the evening on Tue, Thur, and Sat Tenn[employer] Must   INR was SUPRAtherapeutic (6.2) on admission  Assessment: 65 y/o M on lifelong anticoagulation with warfarin for h/o PE, body habitus, and sedentary lifestyle admitted with left knee dislocation and effusion concerning for hematoma. On admission - warfarin was held, vitamin K was given for supratherapeutic INR, and pt was transfused. Ortho following - no surgical intervention planned.   Pharmacy consulted to resume warfarin on 9/23 with heparin bridge while INR subtherapeutic.   Significant Events: -Warfarin held from 9/15 - 9/22 -Vitamin K given on 9/15, 9/16, and 9/17  Today, 06/24/21 INR = 1.9, subtherapeutic as expected after holding warfarin and reversing with vitamin K but increased.  HL = 0.45 remains therapeutic on heparin infusion of 2600 units/hr  CBC: Hgb low but stable; Plt elevated No further bleeding reported from wounds or elsewhere per RN  Goal of Therapy:  INR 2-3 Heparin  level 0.3-0.7 units/ml Monitor platelets by anticoagulation protocol: Yes   Plan:  Continue heparin at 2600 units/hr Warfarin 5 mg PO once today Would recommend dosing less than or equal to warfarin 5 mg daily upon discharge d/t supratherapeutic INR on previous dose resulting in blood loss. Recommend f/u INR 1 week at longest HL, CBC, INR daily Monitor for signs of bleeding  Peggyann Juba, PharmD, BCPS Pharmacy: [phone] 06/24/21 7:17 AM

## 2021-06-24 NOTE — Progress Notes (Signed)
Physical Therapy Treatment Patient Details Name: Joseph [name]. MRN: [id number] DOB: [birth year] Today's Date: 06/24/2021   History of Present Illness Joseph [name] is a 66 yo male who presnets with increased LLE swelling after recent fall at home. 9/15 pt fell when getting off x-ray table at hospital. X-rays showed a knee dislocation which was confirmed on CT scan as a posterior lateral dislocation; L knee reduced and L KI placed. Lumbar and pelvic xrays negative. 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 Patient able to reach for rails. Assist with pannus during rolling.Patient assisted to recliner via maxisky . LLE propped onto support with  KI in place. Patient did report mild dizziness. BP 165/37. Patient left in upright position. LLE supported.May consider Kreg tilt bed for  patient comfort and facilitate upright position and working towards partial stand, NWB on LLE as deemed safe.   Recommendations for follow up therapy are one component of a multi-disciplinary discharge planning process, led by the attending physician.  Recommendations may be updated based on patient status, additional functional criteria and insurance authorization.  Follow Up Recommendations  SNF     Equipment Recommendations  Other (comment);Hospital bed    Recommendations for Other Services       Precautions / Restrictions Precautions Precaution Comments: NO knee ROM on L, strict, + 2 to support to  reposition KI farther up on leg for knee support prior to mobility Required Braces or Orthoses: Knee Immobilizer - Left Knee Immobilizer - Left: On at all times Restrictions Weight Bearing Restrictions: Yes     Mobility  Bed Mobility   Bed Mobility: Rolling Rolling: +2 for physical assistance;+2 for safety/equipment;Total assist         General bed  mobility comments: to place lift pad, support of pannus and for Left LLE , strict support.    Transfers                 General transfer comment: maxisky lift to recliner, supporting LLE. patient toklerated well.  Ambulation/Gait                 Stairs             Wheelchair Mobility    Modified Rankin (Stroke Patients Only)       Balance       Sitting balance - Comments: seated in recliner with support                                    Cognition Arousal/Alertness: Awake/alert Behavior During Therapy: WFL for tasks assessed/performed Overall Cognitive Status: Within Functional Limits for tasks assessed                                 General Comments: wife was present during session      Exercises      General Comments        Pertinent Vitals/Pain Faces Pain Scale: Hurts little more Pain Location: Left knee Pain Descriptors / Indicators: Grimacing;Aching;Sore;Discomfort;Guarding Pain Intervention(s): Monitored during session;Premedicated before session    Home Living  Prior Function            PT Goals (current goals can now be found in the care plan section) Progress towards PT goals: Progressing toward goals    Frequency    Min 3X/week      PT Plan Current plan remains appropriate    Co-evaluation              AM-PAC PT "6 Clicks" Mobility   Outcome Measure  Help needed turning from your back to your side while in a flat bed without using bedrails?: Total Help needed moving from lying on your back to sitting on the side of a flat bed without using bedrails?: Total Help needed moving to and from a bed to a chair (including a wheelchair)?: Total Help needed standing up from a chair using your arms (e.g., wheelchair or bedside chair)?: Total Help needed to walk in hospital room?: Total Help needed climbing 3-5 steps with a railing? : Total 6 Click Score:  6    End of Session   Activity Tolerance: Patient tolerated treatment well Patient left: in chair;with call bell/phone within reach;with family/visitor present Nurse Communication: Mobility status;Need for lift equipment PT Visit Diagnosis: Other abnormalities of gait and mobility (R26.89);Muscle weakness (generalized) (M62.81);History of falling (Z91.81)     Time: 1330-1430 PT Time Calculation (min) (ACUTE ONLY): 60 min  Charges:  $Therapeutic Activity: 38-52 mins $Self Care/Home Management: [place] Pager [phone] Office [phone]   Claretha Cooper 06/24/2021, 3:33 PM

## 2021-06-24 NOTE — Progress Notes (Addendum)
Physical Therapy Treatment Patient Details Name: Joseph [name]. MRN: [id number] DOB: 18-Jan-1956 Today's Date: 06/24/2021   History of Present Illness Joseph [name] is a 65 yo male who presnets with increased LLE swelling after recent fall at home. 9/15 pt fell when getting off x-ray table at hospital. X-rays showed a knee dislocation which was confirmed on CT scan as a posterior lateral dislocation; L knee reduced and L KI placed. Lumbar and pelvic xrays negative. 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 +3-4 assist required. Pt reported 10/10 pain in L knee/LE after sitting up for ~1 hour. RN in to medicate. Will continue to follow during hospital stay.     Recommendations for follow up therapy are one component of a multi-disciplinary discharge planning process, led by the attending physician.  Recommendations may be updated based on patient status, additional functional criteria and insurance authorization.  Follow Up Recommendations  SNF     Equipment Recommendations  Hospital bed    Recommendations for Other Services       Precautions / Restrictions Precautions Precautions: Fall Precaution Comments: NO knee ROM on L-STRICT; + 2 to support and reposition KI farther up on leg for proper knee support/immobilization prior to mobility Required Braces or Orthoses: Knee Immobilizer - Left Knee Immobilizer - Left: On at all times Restrictions Weight Bearing Restrictions: Yes LLE Weight Bearing: Non weight bearing     Mobility  Bed Mobility Overal bed mobility: Needs Assistance Bed Mobility: Rolling Rolling: Max assist;+2 for physical assistance;+2 for safety/equipment         General bed mobility comments:+3 assist Rolling to L and R side to remove lift pad. Assist to support pannus, L LE, turn.    Transfers Overall transfer level: Needs assistance    Transfers: Sit to/from Stand           General transfer comment: maxisky lift: recliner to bed while supporting LLE.  Ambulation/Gait                 Stairs             Wheelchair Mobility    Modified Rankin (Stroke Patients Only)       Balance       Sitting balance - Comments: seated in recliner with support                                    Cognition Arousal/Alertness: Awake/alert Behavior During Therapy: WFL for tasks assessed/performed Overall Cognitive Status: Within Functional Limits for tasks assessed                                 General Comments: wife was present during session      Exercises      General Comments        Pertinent Vitals/Pain Pain Assessment: 0-10 Pain Score: 10-Worst pain ever Faces Pain Scale: Hurts little more Pain Location: Left knee Pain Descriptors / Indicators: Grimacing;Aching;Sore;Discomfort;Guarding Pain Intervention(s): RN gave pain meds during session;Repositioned    Home Living                      Prior Function            PT  Goals (current goals can now be found in the care plan section) Progress towards PT goals: Progressing toward goals    Frequency    Min 3X/week      PT Plan Current plan remains appropriate    Co-evaluation              AM-PAC PT "6 Clicks" Mobility   Outcome Measure  Help needed turning from your back to your side while in a flat bed without using bedrails?: Total Help needed moving from lying on your back to sitting on the side of a flat bed without using bedrails?: Total Help needed moving to and from a bed to a chair (including a wheelchair)?: Total Help needed standing up from a chair using your arms (e.g., wheelchair or bedside chair)?: Total Help needed to walk in hospital room?: Total Help needed climbing 3-5 steps with a railing? : Total 6 Click Score: 6    End of Session   Activity Tolerance: Patient  limited by pain Patient left: in bed;with call bell/phone within reach;with family/visitor present Nurse Communication: Need for lift equipment;Mobility status PT Visit Diagnosis: Other abnormalities of gait and mobility (R26.89);Muscle weakness (generalized) (M62.81);History of falling (Z91.81);Repeated falls (R29.6);Pain Pain - Right/Left: Left Pain - part of body: Knee;Leg     Time: 2841-3244 PT Time Calculation (min) (ACUTE ONLY): 21 min  Charges:  $Therapeutic Activity: 8-22 mins $Self Care/Home Management: 8-22              Doreatha Massed, PT Acute Rehabilitation  Office: [phone] Pager: [phone]

## 2021-06-24 NOTE — Plan of Care (Signed)
Joseph [name]'s wounds were measured today and documented. His dressings were changed as ordered. Left knee immobilizer remains in place. Pt was assisted oob to chair with PT x 4 assist using maxisky. He was premedicated with IV dilaudid prior to PT. He sat up for ~ an hour but was limited by intense pain in his left knee while in the chair. Assisted back to bed w/ 4 assist using maxisky again.  Problem: Education: Goal: Knowledge of General Education information will improve Description: Including pain rating scale, medication(s)/side effects and non-pharmacologic comfort measures Outcome: Progressing   Problem: Health Behavior/Discharge Planning: Goal: Ability to manage health-related needs will improve Outcome: Progressing   Problem: Clinical Measurements: Goal: Ability to maintain clinical measurements within normal limits will improve Outcome: Progressing   Problem: Activity: Goal: Risk for activity intolerance will decrease Outcome: Progressing Note: OOB to chair w/ PT using maxisky.    Problem: Nutrition: Goal: Adequate nutrition will be maintained Outcome: Progressing   Problem: Coping: Goal: Level of anxiety will decrease Outcome: Progressing   Problem: Elimination: Goal: Will not experience complications related to bowel motility Outcome: Progressing   Problem: Pain Managment: Goal: General experience of comfort will improve Outcome: Progressing Note: Oxy IR with good effect for pain mgmt. Uses dilaudid IV prior to working with PT.    Problem: Safety: Goal: Ability to remain free from injury will improve Outcome: Progressing   Problem: Skin Integrity: Goal: Risk for impaired skin integrity will decrease Outcome: Progressing Note: Daily dsg changes.

## 2021-06-24 NOTE — Progress Notes (Signed)
Inpatient Rehab Admissions Coordinator:    I do not have a bed or insurance auth for this pt. For CIR today. I am following for progress and participation with therapies as this pt. Is not currently doing enough for CIR or to get insurance auth for CIR admit.   Clemens Catholic, [place], [place] Admissions Coordinator  [phone] ([place]) [phone] (office)

## 2021-06-24 NOTE — Plan of Care (Signed)
  Problem: Clinical Measurements: Goal: Will remain free from infection Outcome: Progressing   Problem: Coping: Goal: Level of anxiety will decrease Outcome: Progressing   Problem: Pain Managment: Goal: General experience of comfort will improve Outcome: Progressing   Problem: Safety: Goal: Ability to remain free from injury will improve Outcome: Progressing   

## 2021-06-25 ENCOUNTER — Inpatient Hospital Stay (HOSPITAL_COMMUNITY): Payer: HMO

## 2021-06-25 DIAGNOSIS — E669 Obesity, unspecified: Secondary | ICD-10-CM | POA: Diagnosis not present

## 2021-06-25 DIAGNOSIS — S83105A Unspecified dislocation of left knee, initial encounter: Secondary | ICD-10-CM | POA: Diagnosis not present

## 2021-06-25 DIAGNOSIS — E1165 Type 2 diabetes mellitus with hyperglycemia: Secondary | ICD-10-CM | POA: Diagnosis not present

## 2021-06-25 DIAGNOSIS — I10 Essential (primary) hypertension: Secondary | ICD-10-CM | POA: Diagnosis not present

## 2021-06-25 LAB — CBC
HCT: 32.1 % — ABNORMAL LOW (ref 39.0–52.0)
Hemoglobin: 9.6 g/dL — ABNORMAL LOW (ref 13.0–17.0)
MCH: 28.2 pg (ref 26.0–34.0)
MCHC: 29.9 g/dL — ABNORMAL LOW (ref 30.0–36.0)
MCV: 94.4 fL (ref 80.0–100.0)
Platelets: 516 10*3/uL — ABNORMAL HIGH (ref 150–400)
RBC: 3.4 MIL/uL — ABNORMAL LOW (ref 4.22–5.81)
RDW: 16.2 % — ABNORMAL HIGH (ref 11.5–15.5)
WBC: 7.2 10*3/uL (ref 4.0–10.5)
nRBC: 0.3 % — ABNORMAL HIGH (ref 0.0–0.2)

## 2021-06-25 LAB — PROTIME-INR
INR: 2 — ABNORMAL HIGH (ref 0.8–1.2)
Prothrombin Time: 23 seconds — ABNORMAL HIGH (ref 11.4–15.2)

## 2021-06-25 LAB — GLUCOSE, CAPILLARY
Glucose-Capillary: 253 mg/dL — ABNORMAL HIGH (ref 70–99)
Glucose-Capillary: 239 mg/dL — ABNORMAL HIGH (ref 70–99)
Glucose-Capillary: 152 mg/dL — ABNORMAL HIGH (ref 70–99)
Glucose-Capillary: 187 mg/dL — ABNORMAL HIGH (ref 70–99)

## 2021-06-25 LAB — HEPARIN LEVEL (UNFRACTIONATED): Heparin Unfractionated: 0.34 IU/mL (ref 0.30–0.70)

## 2021-06-25 MED ORDER — AMLODIPINE BESYLATE 10 MG PO TABS
10.0000 mg | ORAL_TABLET | Freq: Every day | ORAL | Status: DC
  Administered 2021-06-25 – 2021-07-19 (×25): 10 mg via ORAL
  Filled 2021-06-25 (×25): qty 1

## 2021-06-25 MED ORDER — WARFARIN SODIUM 5 MG PO TABS
5.0000 mg | ORAL_TABLET | Freq: Once | ORAL | Status: AC
  Administered 2021-06-25: 5 mg via ORAL
  Filled 2021-06-25: qty 1

## 2021-06-25 MED ORDER — MORPHINE SULFATE (PF) 2 MG/ML IV SOLN
2.0000 mg | Freq: Once | INTRAVENOUS | Status: AC | PRN
  Administered 2021-06-26: 2 mg via INTRAVENOUS
  Filled 2021-06-25: qty 1

## 2021-06-25 NOTE — Progress Notes (Addendum)
PROGRESS NOTE    Joseph [name].  GXQ:[id number] DOB: July 11, 1956 DOA: 06/11/2021 PCP: Donald Prose, MD   Brief Narrative: Joseph Pollio. is a 65 y.o. 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  [facility] patient presents secondary to fall at home resulting in left knee pain and found to have a left knee dislocation with associated left knee effusion with concern for possible hematoma in setting of supratherapeutic INR.  Orthopedic surgery was consulted and reduced patient's knee but recommended no surgical management secondary to patient's body habitus.  Patient required INR reversal with vitamin K and Coumadin was held secondary to concern for active bleeding.  PT/OT recommending rehab.  Coumadin restarted with heparin drip bridge.   Assessment & Plan:   Principal Problem:   Left knee dislocation, initial encounter Active Problems:   Pure hypercholesterolemia   Essential hypertension, benign   OSA (obstructive sleep apnea)   Hx pulmonary embolism   Esophageal reflux   Diabetes mellitus type 2, uncontrolled (HCC)   Diabetic neuropathy (HCC)   Acute renal failure superimposed on stage 3b chronic kidney disease (HCC)   Normocytic anemia   Class 3 obesity   Aortic atherosclerosis (HCC)   Coronary atherosclerosis   Effusion of left knee   Left knee dislocation   Left knee dislocation Secondary to fall. Associated fragments concerning for fracture seen on x-ray. Orthopedic surgery was consulted and reduced patient's knee with recommendations for nonweightbearing status of left leg in addition to strict use of an immobilizer and no ROM. No surgery recommended secondary to body habitus and leg wounds. PT/OT evaluating for rehab; SNF vs CIR. -Oxycodone prn -Left knee MRI  Left knee effusion Possible left  knee hematoma In setting of fall and trauma to knee while on Coumadin with supratherapeutic INR. Improving. No surgical intervention.  Right knee pain No obvious abnormalities.  -Right knee x-ray  Normocytic anemia In setting of CKD in addition to likely hematoma. Patient received 2 units of PRBC and 1 unit of FFP this admission. Hemoglobin stable.  AKI on CKD stage IIIb Baseline creatinine of about 1.4-1.5. Creatinine of 1.50 on admission with peak creatinine of 2.87. Possible AKI from ATN related to hypoperfusion from blood loss. Improved with IV fluids.  Hyperkalemia Resolved.  History of pulmonary embolism Patient is on Coumadin as an outpatient. Requiring bridge secondary to cessation of Coumadin and vitamin K INR reversal on admission for acute hemorrhage -Continue Coumadin per pharmacy (Goal INR of 2-3) -Continue heparin IV for bridge  CAD Asymptomatic.  Diabetic neuropathy Diabetes mellitus, type 2 Hemoglobin A1C of 6.1%. Patient is on metformin, Actos and Lantus as an outpatient. -Continue Semglee 50 units qHS, Novolog 3 units TID, SSI, Actos  GERD -Continue Protonix  OSA -Continue CPAP qHS  Primary hypertension Patient is on quinapril and amlodipine as an outpatient which have been held this admission. Blood pressure uncontrolled. -amlodipine 10 mg daily  Hyperlipidemia Patient is on pravastatin as an outpatient which is held currently.  Constipation In setting of opiate use. -Continue Colace, Senokot, Miralax  Obesity Body mass index is 63.41 kg/m.    DVT prophylaxis: Heparin IV/Coumadin Code Status:   Code Status: Full Code Family Communication: Wife at bedside Disposition Plan: Discharge to CIR vs SNF when bed is available likely in 24-48 hours   Consultants:  Orthopedic surgery  Procedures:  LEFT  KNEE REDUCTION  Antimicrobials: Keflex    Subjective: Pain of left knee because his panus weighs down on his leg. Also with right leg  knee.  Objective: Vitals:   06/24/21 1129 06/24/21 1505 06/25/21 0546 06/25/21 0900  BP: (!) 160/48  (!) 152/46   Pulse: 82  79   Resp: 17     Temp: 98.1 F (36.7 C)     TempSrc: Oral     SpO2: 97% 96% 96%   Weight:    (!) 218 kg  Height:        Intake/Output Summary (Last 24 hours) at 06/25/2021 1113 Last data filed at 06/25/2021 0900 Gross per 24 hour  Intake 1223.13 ml  Output 500 ml  Net 723.13 ml    Filed Weights   06/11/21 1326 06/25/21 0900  Weight: (!) 208.7 kg (!) 218 kg    Examination:  General exam: Appears calm and comfortable Respiratory system: Clear to auscultation. Respiratory effort normal. Cardiovascular system: S1 & S2 heard, RRR. No murmurs, rubs, gallops or clicks. Gastrointestinal system: Large panus, soft and nontender. No organomegaly or masses felt. Normal bowel sounds heard. Central nervous system: Alert and oriented. No focal neurological deficits. Musculoskeletal: No calf tenderness. Right knee with tenderness laterally/medially Skin: No cyanosis. Psychiatry: Judgement and insight appear normal. Mood & affect appropriate.     Data Reviewed: I have personally reviewed following labs and imaging studies  CBC Lab Results  Component Value Date   WBC 7.2 06/25/2021   RBC 3.40 (L) 06/25/2021   HGB 9.6 (L) 06/25/2021   HCT 32.1 (L) 06/25/2021   MCV 94.4 06/25/2021   MCH 28.2 06/25/2021   PLT 516 (H) 06/25/2021   MCHC 29.9 (L) 06/25/2021   RDW 16.2 (H) 06/25/2021   LYMPHSABS 1.7 06/17/2021   MONOABS 0.7 06/17/2021   EOSABS 0.4 06/17/2021   BASOSABS 0.1 35/46/5681     Last metabolic panel Lab Results  Component Value Date   NA 137 06/24/2021   K 4.7 06/24/2021   CL 105 06/24/2021   CO2 26 06/24/2021   BUN 37 (H) 06/24/2021   CREATININE 1.24 06/24/2021   GLUCOSE 235 (H) 06/24/2021   GFRNONAA >60 06/24/2021   GFRAA 51 (L) 04/05/2020   CALCIUM 8.5 (L) 06/24/2021   PHOS 2.8 04/05/2020   PROT 5.9 (L) 06/24/2021   ALBUMIN 2.4  (L) 06/24/2021   BILITOT 0.9 06/24/2021   ALKPHOS 89 06/24/2021   AST 16 06/24/2021   ALT 16 06/24/2021   ANIONGAP 6 06/24/2021    CBG (last 3)  Recent Labs    06/24/21 1706 06/24/21 1920 06/25/21 0729  GLUCAP 187* 175* 253*      GFR: Estimated Creatinine Clearance: 115 mL/min (by C-G formula based on SCr of 1.24 mg/dL).  Coagulation Profile: Recent Labs  Lab 06/21/21 0309 06/22/21 0321 06/23/21 0411 06/24/21 0507 06/25/21 0425  INR 1.3* 1.4* 1.7* 1.9* 2.0*     No results found for this or any previous visit (from the past 240 hour(s)).      Radiology Studies: No results found.      Scheduled Meds:  (feeding supplement) PROSource Plus  30 mL Oral BID BM   cephALEXin  250 mg Oral QHS   docusate sodium  100 mg Oral BID   feeding supplement (GLUCERNA SHAKE)  237 mL Oral Q24H   FLUoxetine  40 mg Oral QPM   insulin aspart  0-20 Units Subcutaneous TID WC   insulin aspart  0-5 Units Subcutaneous QHS  insulin aspart  3 Units Subcutaneous TID WC   insulin glargine-yfgn  50 Units Subcutaneous QHS   methocarbamol  1,000 mg Oral BID   multivitamin with minerals  1 tablet Oral Daily   pantoprazole  40 mg Oral Daily   pioglitazone  45 mg Oral Daily   polyethylene glycol  17 g Oral BID   Ensure Max Protein  11 oz Oral Daily   senna  1 tablet Oral Daily   tamsulosin  0.4 mg Oral Daily   warfarin  5 mg Oral ONCE-1600   Warfarin - Pharmacist Dosing Inpatient   Does not apply q1600   Continuous Infusions:  heparin 2,600 Units/hr (06/25/21 0713)     LOS: 13 days     Cordelia Poche, MD [facility] 06/25/2021, 11:13 AM  If 7PM-7AM, please contact night-coverage [email or web address]

## 2021-06-25 NOTE — Plan of Care (Signed)
Plan of care reviewed with pt and wife at bedside. Pt alert and oriented and in no acute distress. Pt pain controlled with PO oxycodone and tylenol. Pt moved from maxi move bed to Kreg tilt bed to facilitate physical therapy. Pt able to tolerate bed in chair position in kreg bed. Pt continued on Heparin drip at therapeutic level and bridging to PO coumadin. X rays done on RLE. Dressings changed and immobilizer changed to LLE. Pt using pure wick and has adequate UOP. No BM this shift; pt continues to refuse miralax - education provided. Plan for MRI this evening - coordinating with imaging to attempt MRI.  Pt CBG monitored AC/HS and treated with meal time as well as sliding scale insulin. Pt safety maintained. Will continue to monitor and reassess.    Problem: Education: Goal: Knowledge of General Education information will improve Description: Including pain rating scale, medication(s)/side effects and non-pharmacologic comfort measures Outcome: Progressing   Problem: Health Behavior/Discharge Planning: Goal: Ability to manage health-related needs will improve Outcome: Progressing   Problem: Clinical Measurements: Goal: Ability to maintain clinical measurements within normal limits will improve Outcome: Progressing   Problem: Activity: Goal: Risk for activity intolerance will decrease Outcome: Progressing   Problem: Nutrition: Goal: Adequate nutrition will be maintained Outcome: Progressing   Problem: Coping: Goal: Level of anxiety will decrease Outcome: Progressing   Problem: Elimination: Goal: Will not experience complications related to bowel motility Outcome: Progressing   Problem: Pain Managment: Goal: General experience of comfort will improve Outcome: Progressing   Problem: Safety: Goal: Ability to remain free from injury will improve Outcome: Progressing   Problem: Skin Integrity: Goal: Risk for impaired skin integrity will decrease Outcome: Progressing

## 2021-06-25 NOTE — Plan of Care (Signed)

## 2021-06-25 NOTE — Progress Notes (Signed)
At beginning of shift patient and the wife asked what the goals were to be accepted into the CIR [facility]) This RN looked back at progress notes during the shift. This morning RN told the patient that basic goals per the Rehab Admissions Coordinators note were ability to bear weight on good leg to establish ability to tolerate 3 hrs of rehab per day, in addition to attempting transfer out of bed.  When speaking to the patient, his baseline mobility (ie walking distance, time standing ect.) was very limited prior to injury and that he may want to discuss with physical therapy and transition of care team what his pre injury baselines were to establish more realistic goals of rehab/therapy  Pt endorses this morning that rolling in bed doesn't hurt near as bad at it did days ago, pain seems to be fairly well managed with PO, but patient has not been out of bed during the night shift

## 2021-06-25 NOTE — Progress Notes (Signed)
Orthopedic Tech Progress Note Patient Details:  Joseph [name] 02/16/1956 [id number]  Patient ID: Joseph Dowse., male   DOB: 01/15/1956, 65 y.o.   MRN: [id number]  Joseph [name] 06/25/2021, 2:17 PM Knee immobilizer re applied to left leg.

## 2021-06-25 NOTE — Progress Notes (Signed)
Physical Therapy Treatment Patient Details Name: Joseph [name]. MRN: [id number] DOB: December 03, 1955 Today's Date: 06/25/2021   History of Present Illness Joseph [name] is a 65 yo male who presnets with increased LLE swelling after recent fall at home. 9/15 pt fell when getting off x-ray table at hospital. X-rays showed a knee dislocation which was confirmed on CT scan as a posterior lateral dislocation; L knee reduced and L KI placed. Lumbar and pelvic xrays negative. 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 Patient able to lean forward multiple times, reports improved respiratory when upright. PT lifted pannus from Left thigh x 3 to relieve pressure . Patient sat upright x 20 minutes. RN in to  report patient will go to  MRI. Nursing instructed on use of motoring bed to MRI later.   Recommendations for follow up therapy are one component of a multi-disciplinary discharge planning process, led by the attending physician.  Recommendations may be updated based on patient status, additional functional criteria and insurance authorization.  Follow Up Recommendations  SNF;CIR     Equipment Recommendations   (TBD)    Recommendations for Other Services       Precautions / Restrictions Precautions Precaution Comments: NO knee ROM on L-STRICT; + 2 to support and reposition KI farther up on leg for proper knee support/immobilization prior to mobility Required Braces or Orthoses: Knee Immobilizer - Left Knee Immobilizer - Left: On at all times Restrictions LLE Weight Bearing: Non weight bearing     Mobility  Bed Mobility   Bed Mobility: Rolling Rolling: Mod assist;Max assist         General bed mobility comments: rolls to left with mod assistance( pannus shifts to left), rolls to right with supporting left leg duuring roll and support pannus.     Transfers                 General transfer comment: maxisky lift:  Ambulation/Gait                 Stairs             Wheelchair Mobility    Modified Rankin (Stroke Patients Only)       Balance       Sitting balance - Comments: bed placed iin bed chair position. patient able  to lean forward and balance self.                                    Cognition Arousal/Alertness: Awake/alert                                            Exercises      General Comments        Pertinent Vitals/Pain Faces Pain Scale: Hurts a little bit Pain Location: Left knee Pain Descriptors / Indicators: Grimacing;Aching;Sore;Discomfort;Guarding Pain Intervention(s): Premedicated before session    Home Living                      Prior Function            PT Goals (current goals can now be found in the care plan section)  Progress towards PT goals: Progressing toward goals    Frequency    Min 3X/week      PT Plan Current plan remains appropriate    Co-evaluation              AM-PAC PT "6 Clicks" Mobility   Outcome Measure  Help needed turning from your back to your side while in a flat bed without using bedrails?: A Lot Help needed moving from lying on your back to sitting on the side of a flat bed without using bedrails?: A Lot Help needed moving to and from a bed to a chair (including a wheelchair)?: Total Help needed standing up from a chair using your arms (e.g., wheelchair or bedside chair)?: Total Help needed to walk in hospital room?: Total Help needed climbing 3-5 steps with a railing? : Total 6 Click Score: 8    End of Session   Activity Tolerance: Patient tolerated treatment well Patient left: in bed;with call bell/phone within reach Nurse Communication: Need for lift equipment;Mobility status PT Visit Diagnosis: Other abnormalities of gait and mobility (R26.89);Muscle weakness  (generalized) (M62.81);History of falling (Z91.81);Repeated falls (R29.6);Pain Pain - Right/Left: Left Pain - part of body: Knee;Leg     Time: 1430-1530 PT Time Calculation (min) (ACUTE ONLY): 60 min  Charges:  $Therapeutic Activity: 53-67 mins                     Tresa Endo PT Acute Rehabilitation Services Pager [phone] Office [phone]    Claretha Cooper 06/25/2021, 4:02 PM

## 2021-06-25 NOTE — Progress Notes (Signed)
Physical Therapy Treatment Patient Details Name: Joseph [name]. MRN: [id number] DOB: 01-Oct-1955 Today's Date: 06/25/2021   History of Present Illness Joseph [name] is a 65 yo male who presnets with increased LLE swelling after recent fall at home. 9/15 pt fell when getting off x-ray table at hospital. X-rays showed a knee dislocation which was confirmed on CT scan as a posterior lateral dislocation; L knee reduced and L KI placed. Lumbar and pelvic xrays negative. 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 Lifted patient from bed with maxisky to place a Kreg tilt bed  . Patient instructed in basic  operation of HOB and foot. Plan placing in chair position in PM.  Recommendations for follow up therapy are one component of a multi-disciplinary discharge planning process, led by the attending physician.  Recommendations may be updated based on patient status, additional functional criteria and insurance authorization.  Follow Up Recommendations  CIR /SNF-depending on progress for pivotin.sign      Equipment Recommendations   (TBD)    Recommendations for Other Services       Precautions / Restrictions Precautions Precaution Comments: NO knee ROM on L-STRICT; + 2 to support and reposition KI farther up on leg for proper knee support/immobilization prior to mobility Required Braces or Orthoses: Knee Immobilizer - Left Knee Immobilizer - Left: On at all times Restrictions LLE Weight Bearing: Non weight bearing     Mobility  Bed Mobility   Bed Mobility: Rolling Rolling: Mod assist;Max assist         General bed mobility comments: rolls to left with m=od assistance( pannus shifts to left), rolls to right with supporting left leg duuring roll. Lift pad placed under pt for lifting OOB to get a new bed placed under patient.    Transfers                 General transfer  comment: maxisky lift:  Ambulation/Gait                 Stairs             Wheelchair Mobility    Modified Rankin (Stroke Patients Only)       Balance                                            Cognition Arousal/Alertness: Awake/alert                                            Exercises      General Comments        Pertinent Vitals/Pain Faces Pain Scale: Hurts a little bit Pain Location: Left knee Pain Descriptors / Indicators: Grimacing;Aching;Sore;Discomfort;Guarding Pain Intervention(s): Premedicated before session    Home Living                      Prior Function            PT Goals (current goals can now be found in the care plan section) Progress towards PT goals: Progressing toward goals    Frequency    Min 3X/week      PT Plan  Co-evaluation              AM-PAC PT "6 Clicks" Mobility   Outcome Measure  Help needed turning from your back to your side while in a flat bed without using bedrails?: A Lot Help needed moving from lying on your back to sitting on the side of a flat bed without using bedrails?: A Lot Help needed moving to and from a bed to a chair (including a wheelchair)?: Total Help needed standing up from a chair using your arms (e.g., wheelchair or bedside chair)?: Total Help needed to walk in hospital room?: Total Help needed climbing 3-5 steps with a railing? : Total 6 Click Score: 8    End of Session   Activity Tolerance: Patient tolerated treatment well Patient left: in bed;with call bell/phone within reach;with family/visitor present Nurse Communication: Need for lift equipment;Mobility status PT Visit Diagnosis: Other abnormalities of gait and mobility (R26.89);Muscle weakness (generalized) (M62.81);History of falling (Z91.81);Repeated falls (R29.6);Pain Pain - Right/Left: Left Pain - part of body: Knee;Leg     Time: 1000-1033 PT Time  Calculation (min) (ACUTE ONLY): 33 min  Charges:  $Therapeutic Activity: 23-37 mins                     Tresa Endo PT Acute Rehabilitation Services Pager [phone] Office [phone]    Claretha Cooper 06/25/2021, 3:55 PM

## 2021-06-25 NOTE — Progress Notes (Signed)
[place] for Warfarin with heparin bridge Indication: h/o pulmonary embolus  Allergies  Allergen Reactions   Tape     Peels skin on legs   Chlorhexidine Rash    Wipes    Patient Measurements: Height: 6\' 1"  (185.4 cm) Weight: (!) 208.7 kg (460 lb) IBW/kg (Calculated) : 79.9 Heparin Dosing Weight: 132.5 kg  Vital Signs: BP: 152/46 (09/29 0546) Pulse Rate: 79 (09/29 0546)  Labs: Recent Labs    06/23/21 0411 06/24/21 0507 06/25/21 0425  HGB 9.0* 9.6* 9.6*  HCT 30.4* 32.1* 32.1*  PLT 523* 539* 516*  LABPROT 20.3* 21.4* 23.0*  INR 1.7* 1.9* 2.0*  HEPARINUNFRC 0.63 0.45 0.34  CREATININE 1.26* 1.24  --   CKTOTAL  --  37*  --      Estimated Creatinine Clearance: 111.9 mL/min (by C-G formula based on SCr of 1.24 mg/dL).  Medications:  PTA warfarin dosing:  Take 5 mg in the evening on Sun, Mon, Wed, and Fri. Take 7.5 mg in the evening on Tue, Thur, and Sat Tennis Must   INR was SUPRAtherapeutic (6.2) on admission  Assessment: 65 y/o M on lifelong anticoagulation with warfarin for h/o PE, body habitus, and sedentary lifestyle admitted with left knee dislocation and effusion concerning for hematoma. On admission - warfarin was held, vitamin K was given for supratherapeutic INR, and pt was transfused. Ortho following - no surgical intervention planned.   Pharmacy consulted to resume warfarin on 9/23 with heparin bridge while INR subtherapeutic.   Significant Events: -Warfarin held from 9/15 - 9/22 -Vitamin K given on 9/15, 9/16, and 9/17  Today, 06/25/21 INR = 2.0, now therapeutic  HL = 0.34 remains therapeutic on heparin infusion of 2600 units/hr  CBC: Hgb low but stable; Plt elevated No further bleeding reported from wounds or elsewhere per RN  Goal of Therapy:  INR 2-3 Heparin level 0.3-0.7 units/ml Monitor platelets by anticoagulation protocol: Yes   Plan:  Continue heparin at 2600 units/hr - will discuss with MD if can  be stopped today since INR is therapeutic Warfarin 5 mg PO once today Would recommend dosing less than or equal to warfarin 5 mg daily upon discharge d/t supratherapeutic INR on previous dose resulting in blood loss. Recommend f/u INR 1 week at longest HL, CBC, INR daily Monitor for signs of bleeding  Peggyann Juba, PharmD, BCPS Pharmacy: [phone] 06/25/21 8:29 AM

## 2021-06-26 ENCOUNTER — Inpatient Hospital Stay (HOSPITAL_COMMUNITY): Payer: HMO

## 2021-06-26 DIAGNOSIS — S83105A Unspecified dislocation of left knee, initial encounter: Secondary | ICD-10-CM | POA: Diagnosis not present

## 2021-06-26 DIAGNOSIS — E1165 Type 2 diabetes mellitus with hyperglycemia: Secondary | ICD-10-CM | POA: Diagnosis not present

## 2021-06-26 DIAGNOSIS — I10 Essential (primary) hypertension: Secondary | ICD-10-CM | POA: Diagnosis not present

## 2021-06-26 DIAGNOSIS — E669 Obesity, unspecified: Secondary | ICD-10-CM | POA: Diagnosis not present

## 2021-06-26 LAB — CBC
HCT: 34.2 % — ABNORMAL LOW (ref 39.0–52.0)
Hemoglobin: 10.3 g/dL — ABNORMAL LOW (ref 13.0–17.0)
MCH: 28.6 pg (ref 26.0–34.0)
MCHC: 30.1 g/dL (ref 30.0–36.0)
MCV: 95 fL (ref 80.0–100.0)
Platelets: 548 10*3/uL — ABNORMAL HIGH (ref 150–400)
RBC: 3.6 MIL/uL — ABNORMAL LOW (ref 4.22–5.81)
RDW: 16.4 % — ABNORMAL HIGH (ref 11.5–15.5)
WBC: 6.6 10*3/uL (ref 4.0–10.5)
nRBC: 0 % (ref 0.0–0.2)

## 2021-06-26 LAB — GLUCOSE, CAPILLARY
Glucose-Capillary: 221 mg/dL — ABNORMAL HIGH (ref 70–99)
Glucose-Capillary: 174 mg/dL — ABNORMAL HIGH (ref 70–99)
Glucose-Capillary: 152 mg/dL — ABNORMAL HIGH (ref 70–99)
Glucose-Capillary: 147 mg/dL — ABNORMAL HIGH (ref 70–99)

## 2021-06-26 LAB — PROTIME-INR
INR: 2.3 — ABNORMAL HIGH (ref 0.8–1.2)
Prothrombin Time: 25.1 seconds — ABNORMAL HIGH (ref 11.4–15.2)

## 2021-06-26 LAB — HEPARIN LEVEL (UNFRACTIONATED): Heparin Unfractionated: 0.51 IU/mL (ref 0.30–0.70)

## 2021-06-26 MED ORDER — WARFARIN SODIUM 5 MG PO TABS
5.0000 mg | ORAL_TABLET | Freq: Once | ORAL | Status: AC
  Administered 2021-06-26: 5 mg via ORAL
  Filled 2021-06-26: qty 1

## 2021-06-26 NOTE — Progress Notes (Signed)
Inpatient Rehab Admissions Coordinator:    I do not have a CIR bed for this Pt. Today. He continues to be unable to tolerate standing or OOB. I do not think he is able to tolerate intensity of CIR at this time but I will follow for progress and participation with therapies.   Clemens Catholic, [place], [place] Admissions Coordinator  [phone] ([place]) [phone] (office)

## 2021-06-26 NOTE — Progress Notes (Signed)
[place] for Warfarin with heparin bridge Indication: h/o pulmonary embolus  Allergies  Allergen Reactions   Tape     Peels skin on legs   Chlorhexidine Rash    Wipes    Patient Measurements: Height: 6\' 1"  (185.4 cm) Weight: (!) 218 kg (480 lb 9.6 oz) IBW/kg (Calculated) : 79.9 Heparin Dosing Weight: 132.5 kg  Vital Signs: Temp: 98.1 F (36.7 C) (09/30 0522) BP: 159/64 (09/30 0522) Pulse Rate: 77 (09/30 0522)  Labs: Recent Labs    06/24/21 0507 06/25/21 0425 06/26/21 0452  HGB 9.6* 9.6* 10.3*  HCT 32.1* 32.1* 34.2*  PLT 539* 516* 548*  LABPROT 21.4* 23.0* 25.1*  INR 1.9* 2.0* 2.3*  HEPARINUNFRC 0.45 0.34 0.51  CREATININE 1.24  --   --   CKTOTAL 37*  --   --      Estimated Creatinine Clearance: 115 mL/min (by C-G formula based on SCr of 1.24 mg/dL).  Medications:  PTA warfarin dosing:  Take 5 mg in the evening on Sun, Mon, Wed, and Fri. Take 7.5 mg in the evening on Tue, Thur, and Sat Tennis Must   INR was SUPRAtherapeutic (6.2) on admission  Assessment: 65 y/o M on lifelong anticoagulation with warfarin for h/o PE, body habitus, and sedentary lifestyle admitted with left knee dislocation and effusion concerning for hematoma. On admission - warfarin was held, vitamin K was given for supratherapeutic INR, and pt was transfused. Ortho following - no surgical intervention planned.   Pharmacy consulted to resume warfarin on 9/23 with heparin bridge while INR subtherapeutic.   Significant Events: -Warfarin held from 9/15 - 9/22 -Vitamin K given on 9/15, 9/16, and 9/17  Today, 06/26/21 INR = 2.3, now therapeutic x 2 days HL = 0.51 remains therapeutic on heparin infusion of 2600 units/hr  CBC: Hgb improving; Plt elevated Continues to have bruising, but no further bleeding reported from wounds or elsewhere per RN  Goal of Therapy:  INR 2-3 Heparin level 0.3-0.7 units/ml Monitor platelets by anticoagulation protocol:  Yes   Plan:  Continue heparin at 2600 units/hr - will discuss with MD if can be stopped today since INR is therapeutic Warfarin 5 mg PO once today Would recommend dosing less than or equal to warfarin 5 mg daily upon discharge d/t supratherapeutic INR on previous dose resulting in blood loss. Recommend f/u INR 1 week at longest HL, CBC, INR daily Monitor for signs of bleeding  Peggyann Juba, PharmD, BCPS Pharmacy: [phone] 06/26/21 7:24 AM

## 2021-06-26 NOTE — Progress Notes (Signed)
Physical Therapy Treatment Patient Details Name: Joseph [name]. MRN: [id number] DOB: Jan 23, 1956 Today's Date: 06/26/2021   History of Present Illness Joseph [name] is a 65 yo male who presnets with increased LLE swelling after recent fall at home. 9/15 pt fell when getting off x-ray table at hospital. X-rays showed a knee dislocation which was confirmed on CT scan as a posterior lateral dislocation; L knee reduced and L KI placed. Lumbar and pelvic xrays negative. 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  Today worked on rolling and sitting  up onto bed edge, requires 2 mod/max asisstance. Once sitting, supports  self, at times shifts weight, leans for back relief.  Patient became more SOB after about  10 minutes. Assisted back to bed and placed on CPAP. Patient's feet do not touch floor on the floor while sitting on bed edge so standing can not be attempted.  Will begin tilting with controlled NWB on left and attempt weight on right utilizing Kreg bed.   Recommendations for follow up therapy are one component of a multi-disciplinary discharge planning process, led by the attending physician.  Recommendations may be updated based on patient status, additional functional criteria and insurance authorization.  Follow Up Recommendations  SNF;CIR     Equipment Recommendations       Recommendations for Other Services       Precautions / Restrictions Precautions Precautions: Fall Precaution Comments: NO knee ROM on L-STRICT; + 2 to support and reposition KI farther up on leg for proper knee support/immobilization prior to mobility Required Braces or Orthoses: Knee Immobilizer - Left Knee Immobilizer - Left: On at all times Restrictions LLE Weight Bearing: Non weight bearing     Mobility  Bed Mobility   Bed Mobility:  Rolling Rolling: Mod assist;Max assist   Supine to sit: +2 for physical assistance;+2 for safety/equipment;Max assist;HOB elevated Sit to supine: +2 for physical assistance;+2 for safety/equipment;Max assist;HOB elevated   General bed mobility comments: rolls to left with mod assistance( pannus shifts to left), rolls to right with supporting left leg duuring roll and support pannus. patient able to move both legs to side, Patient uses rails to pull trunk around , requires max assistance to pull up to upright position. patient sat x 8 minutes with c/o back pain.(patient does not sit unsupported PTA).    Transfers                 General transfer comment: unasble to attempt, ffet do not ouch ground  Ambulation/Gait                 Stairs             Wheelchair Mobility    Modified Rankin (Stroke Patients Only)       Balance   Sitting-balance support: Single extremity supported Sitting balance-Leahy Scale: Fair Sitting balance - Comments: periodic reaches for foot board and rail for support and shift weight                                    Cognition Arousal/Alertness: Awake/alert  Exercises      General Comments        Pertinent Vitals/Pain Faces Pain Scale: Hurts a little bit Pain Location: Left knee Pain Descriptors / Indicators: Grimacing;Aching;Sore;Discomfort;Guarding Pain Intervention(s): Monitored during session;Premedicated before session;Repositioned    Home Living                      Prior Function            PT Goals (current goals can now be found in the care plan section) Progress towards PT goals: Progressing toward goals    Frequency    Min 3X/week      PT Plan Current plan remains appropriate    Co-evaluation              AM-PAC PT "6 Clicks" Mobility   Outcome Measure  Help needed turning from your back to your side  while in a flat bed without using bedrails?: A Lot Help needed moving from lying on your back to sitting on the side of a flat bed without using bedrails?: A Lot Help needed moving to and from a bed to a chair (including a wheelchair)?: Total Help needed standing up from a chair using your arms (e.g., wheelchair or bedside chair)?: Total Help needed to walk in hospital room?: Total Help needed climbing 3-5 steps with a railing? : Total 6 Click Score: 8    End of Session   Activity Tolerance: Patient tolerated treatment well Patient left: in bed;with call bell/phone within reach;with family/visitor present Nurse Communication: Need for lift equipment;Mobility status PT Visit Diagnosis: Other abnormalities of gait and mobility (R26.89);Muscle weakness (generalized) (M62.81);History of falling (Z91.81);Repeated falls (R29.6);Pain Pain - Right/Left: Left Pain - part of body: Knee;Leg     Time: 1415-1445 PT Time Calculation (min) (ACUTE ONLY): 30 min  Charges:  $Therapeutic Activity: 23-37 mins                     Tresa Endo PT Acute Rehabilitation Services Pager [phone] Office [phone]    Claretha Cooper 06/26/2021, 4:48 PM

## 2021-06-26 NOTE — Progress Notes (Signed)
PROGRESS NOTE    Joseph [name].  YBO:[id number] DOB: 11/22/1955 DOA: 06/11/2021 PCP: Joseph Prose, MD   Brief Narrative: Joseph Jayne. is a 65 y.o. 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  Polyp patient presents secondary to fall at home resulting in left knee pain and found to have a left knee dislocation with associated left knee effusion with concern for possible hematoma in setting of supratherapeutic INR.  Orthopedic surgery was consulted and reduced patient's knee but recommended no surgical management secondary to patient's body habitus.  Patient required INR reversal with vitamin K and Coumadin was held secondary to concern for active bleeding.  PT/OT recommending rehab.  Coumadin restarted with heparin drip bridge.   Assessment & Plan:   Principal Problem:   Left knee dislocation, initial encounter Active Problems:   Pure hypercholesterolemia   Essential hypertension, benign   OSA (obstructive sleep apnea)   Hx pulmonary embolism   Esophageal reflux   Diabetes mellitus type 2, uncontrolled (HCC)   Diabetic neuropathy (HCC)   Acute renal failure superimposed on stage 3b chronic kidney disease (HCC)   Normocytic anemia   Class 3 obesity   Aortic atherosclerosis (HCC)   Coronary atherosclerosis   Effusion of left knee   Left knee dislocation   Left knee dislocation Secondary to fall. Associated fragments concerning for fracture seen on x-ray. Orthopedic surgery was consulted and reduced patient's knee with recommendations for nonweightbearing status of left leg in addition to strict use of an immobilizer and no ROM. No surgery recommended secondary to body habitus and leg wounds. PT/OT evaluating for rehab; SNF vs CIR. -Oxycodone prn -Left knee MRI today  Left knee effusion Possible  left knee hematoma In setting of fall and trauma to knee while on Coumadin with supratherapeutic INR. Improving. No surgical intervention.  Right knee pain No obvious abnormalities. Right knee x-ray with small effusion, otherwise no osseous abnormalities.  Normocytic anemia In setting of CKD in addition to likely hematoma. Patient received 2 units of PRBC and 1 unit of FFP this admission. Hemoglobin stable.  AKI on CKD stage IIIb Baseline creatinine of about 1.4-1.5. Creatinine of 1.50 on admission with peak creatinine of 2.87. Possible AKI from ATN related to hypoperfusion from blood loss. Improved with IV fluids.  Hyperkalemia Resolved.  History of pulmonary embolism Patient is on Coumadin as an outpatient. Requiring bridge secondary to cessation of Coumadin and vitamin K INR reversal on admission for acute hemorrhage -Continue Coumadin per pharmacy (Goal INR of 2-3) -Continue heparin IV for bridge  CAD Asymptomatic.  Diabetic neuropathy Diabetes mellitus, type 2 Hemoglobin A1C of 6.1%. Patient is on metformin, Actos and Lantus as an outpatient. -Continue Semglee 50 units qHS, Novolog 3 units TID, SSI, Actos  GERD -Continue Protonix  OSA -Continue CPAP qHS  Primary hypertension Patient is on quinapril and amlodipine as an outpatient which have been held this admission. Blood pressure uncontrolled. -amlodipine 10 mg daily  Hyperlipidemia Patient is on pravastatin as an outpatient which is held currently.  Constipation In setting of opiate use. -Continue Colace, Senokot, Miralax  Pressure injury Mid-nose, not present on admission. Secondary to CPAP mask  Obesity Body mass index is 63.41 kg/m.    DVT prophylaxis: Heparin IV/Coumadin Code Status:   Code Status: Full Code Family Communication: Wife at bedside Disposition Plan: Discharge to SNF when  bed is available   Consultants:  Orthopedic surgery  Procedures:  LEFT KNEE  REDUCTION  Antimicrobials: Keflex    Subjective: Able to tolerate PT yesterday with not much pain.  Objective: Vitals:   06/25/21 0900 06/25/21 1218 06/26/21 0522 06/26/21 1332  BP:  (!) 161/50 (!) 159/64 (!) 167/42  Pulse:  68 77 75  Resp:  19 20 18   Temp:  97.7 F (36.5 C) 98.1 F (36.7 C) 98.1 F (36.7 C)  TempSrc:  Oral  Oral  SpO2:  99% 95% 96%  Weight: (!) 218 kg     Height:        Intake/Output Summary (Last 24 hours) at 06/26/2021 1506 Last data filed at 06/26/2021 0500 Gross per 24 hour  Intake 1507.71 ml  Output 1100 ml  Net 407.71 ml    Filed Weights   06/11/21 1326 06/25/21 0900  Weight: (!) 208.7 kg (!) 218 kg    Examination:  General exam: Appears calm and comfortable Respiratory system: Clear to auscultation. Respiratory effort normal. Cardiovascular system: S1 & S2 heard, RRR. No murmurs, rubs, gallops or clicks. Gastrointestinal system: Abdomen is obese, soft and nontender. No organomegaly or masses felt. Normal bowel sounds heard. Central nervous system: Alert and oriented. No focal neurological deficits. Musculoskeletal: Venous stasis changes, left knee with immobilizer. Skin tears noted of RLE Skin: No cyanosis. No rashes Psychiatry: Judgement and insight appear normal. Mood & affect appropriate.     Data Reviewed: I have personally reviewed following labs and imaging studies  CBC Lab Results  Component Value Date   WBC 6.6 06/26/2021   RBC 3.60 (L) 06/26/2021   HGB 10.3 (L) 06/26/2021   HCT 34.2 (L) 06/26/2021   MCV 95.0 06/26/2021   MCH 28.6 06/26/2021   PLT 548 (H) 06/26/2021   MCHC 30.1 06/26/2021   RDW 16.4 (H) 06/26/2021   LYMPHSABS 1.7 06/17/2021   MONOABS 0.7 06/17/2021   EOSABS 0.4 06/17/2021   BASOSABS 0.1 87/86/7672     Last metabolic panel Lab Results  Component Value Date   NA 137 06/24/2021   K 4.7 06/24/2021   CL 105 06/24/2021   CO2 26 06/24/2021   BUN 37 (H) 06/24/2021   CREATININE 1.24 06/24/2021    GLUCOSE 235 (H) 06/24/2021   GFRNONAA >60 06/24/2021   GFRAA 51 (L) 04/05/2020   CALCIUM 8.5 (L) 06/24/2021   PHOS 2.8 04/05/2020   PROT 5.9 (L) 06/24/2021   ALBUMIN 2.4 (L) 06/24/2021   BILITOT 0.9 06/24/2021   ALKPHOS 89 06/24/2021   AST 16 06/24/2021   ALT 16 06/24/2021   ANIONGAP 6 06/24/2021    CBG (last 3)  Recent Labs    06/25/21 1953 06/26/21 0735 06/26/21 1158  GLUCAP 187* 221* 174*      GFR: Estimated Creatinine Clearance: 115 mL/min (by C-G formula based on SCr of 1.24 mg/dL).  Coagulation Profile: Recent Labs  Lab 06/22/21 0321 06/23/21 0411 06/24/21 0507 06/25/21 0425 06/26/21 0452  INR 1.4* 1.7* 1.9* 2.0* 2.3*     No results found for this or any previous visit (from the past 240 hour(s)).      Radiology Studies: MR KNEE LEFT WO CONTRAST  Result Date: 06/26/2021 CLINICAL DATA:  Tibial plateau fracture after fall. EXAM: MRI OF THE LEFT KNEE WITHOUT CONTRAST TECHNIQUE: Multiplanar, multisequence MR imaging of the knee was performed. No intravenous contrast was administered. COMPARISON:  Left knee x-rays dated June 15, 2021. CT left knee dated June 11, 2021. FINDINGS: MENISCI Medial  meniscus: Bucket-handle type tear with the posterior horn flipped and displaced anteriorly, lying superior to the anterior horn (series 14, image 14; series 11, image 17). Lateral meniscus: Free edge fraying of the posterior horn (series 14, image 22). LIGAMENTS Cruciates: The ACL is completely ruptured. The PCL appears intact but there is a displaced avulsion fracture of the posterior tibial spine at its attachment. Collaterals: The medial collateral ligament is completely ruptured. The fibular collateral ligament is avulsed from its femoral attachment. The biceps femoris tendon and iliotibial band are intact. CARTILAGE Patellofemoral:  No chondral defect. Medial: Small partial-thickness cartilage defect over the posterior weight-bearing medial femoral condyle. Lateral:   No chondral defect. Joint:  Small joint effusion. Popliteal Fossa:  No Baker cyst.  Completely torn popliteus tendon. Extensor Mechanism: Completely ruptured MPFL and medial patellar retinaculum. Intact quadriceps tendon and patellar tendon. Bones: External rotation and subluxation of proximal tibia with respect to the distal femur, with near dislocation of the lateral compartment. Lateral dislocation of the patella. As above, avulsion fracture of the posterior tibial spine. Other: Large ill-defined fluid collection along the medial aspect of the distal femur containing fat, fluid, and hemorrhage, consistent with degloving injury. Complete tears of the medial and lateral gastrocnemius tendons from the distal femur. IMPRESSION: 1. Unstable knee joint with external rotation and subluxation of the proximal tibia, with near dislocation of the lateral compartment. Lateral dislocation of the patella. 2. Bucket-handle type tear of the medial meniscus with the posterior horn flipped and displaced anteriorly, lying superior to the anterior horn. 3. Complete tears of the ACL, MCL, and MPFL. Complete avulsions of the fibular collateral ligament and popliteus tendon from the distal femur. 4. Avulsion fracture of the tibial spine at the PCL attachment. 5. Complete tears of the medial and lateral gastrocnemius tendons from the distal femur. 6. Large degloving injury along the medial aspect of the distal femur. Electronically Signed   By: Titus Dubin M.D.   On: 06/26/2021 14:44   DG Knee Right Port  Result Date: 06/25/2021 CLINICAL DATA:  Fall. EXAM: PORTABLE RIGHT KNEE - 1-2 VIEW COMPARISON:  None. FINDINGS: The bones are osteopenic. There is no acute fracture or dislocation identified. Joint spaces are maintained. Small joint effusion is present. There are vascular calcifications in the soft tissues. IMPRESSION: No acute bony abnormality. Joint effusion Electronically Signed   By: Ronney Asters M.D.   On: 06/25/2021  16:07        Scheduled Meds:  (feeding supplement) PROSource Plus  30 mL Oral BID BM   amLODipine  10 mg Oral Daily   cephALEXin  250 mg Oral QHS   docusate sodium  100 mg Oral BID   feeding supplement (GLUCERNA SHAKE)  237 mL Oral Q24H   FLUoxetine  40 mg Oral QPM   insulin aspart  0-20 Units Subcutaneous TID WC   insulin aspart  0-5 Units Subcutaneous QHS   insulin aspart  3 Units Subcutaneous TID WC   insulin glargine-yfgn  50 Units Subcutaneous QHS   methocarbamol  1,000 mg Oral BID   multivitamin with minerals  1 tablet Oral Daily   pantoprazole  40 mg Oral Daily   pioglitazone  45 mg Oral Daily   polyethylene glycol  17 g Oral BID   Ensure Max Protein  11 oz Oral Daily   senna  1 tablet Oral Daily   tamsulosin  0.4 mg Oral Daily   warfarin  5 mg Oral ONCE-1600   Warfarin - Pharmacist Dosing  Inpatient   Does not apply q1600   Continuous Infusions:     LOS: 14 days     Cordelia Poche, MD [facility] 06/26/2021, 3:06 PM  If 7PM-7AM, please contact night-coverage [email or web address]

## 2021-06-26 NOTE — TOC Progression Note (Addendum)
Transition of Care (TOC) - Progression Note    Patient Details  Name: Joseph [name]. MRN: [id number] Date of Birth: [birth year]  Transition of Care [facility]) CM/SW Contact  Ross Ludwig, [place] Phone Number: 06/25/21  Clinical Narrative:     CSW spoke to patient's wife, and she gave CSW permission to send updated clinicals to SNFs for placement.  CSW to present bed offers once patient closer to being medically ready for discharge.        Expected Discharge Plan and Services                                                 Social Determinants of Health (SDOH) Interventions    Readmission Risk Interventions No flowsheet data found.

## 2021-06-27 DIAGNOSIS — E1165 Type 2 diabetes mellitus with hyperglycemia: Secondary | ICD-10-CM | POA: Diagnosis not present

## 2021-06-27 DIAGNOSIS — I10 Essential (primary) hypertension: Secondary | ICD-10-CM | POA: Diagnosis not present

## 2021-06-27 DIAGNOSIS — S83105A Unspecified dislocation of left knee, initial encounter: Secondary | ICD-10-CM | POA: Diagnosis not present

## 2021-06-27 LAB — PROTIME-INR
INR: 2.5 — ABNORMAL HIGH (ref 0.8–1.2)
Prothrombin Time: 27.1 seconds — ABNORMAL HIGH (ref 11.4–15.2)

## 2021-06-27 LAB — GLUCOSE, CAPILLARY
Glucose-Capillary: 205 mg/dL — ABNORMAL HIGH (ref 70–99)
Glucose-Capillary: 179 mg/dL — ABNORMAL HIGH (ref 70–99)
Glucose-Capillary: 159 mg/dL — ABNORMAL HIGH (ref 70–99)
Glucose-Capillary: 114 mg/dL — ABNORMAL HIGH (ref 70–99)

## 2021-06-27 MED ORDER — LISINOPRIL 20 MG PO TABS
40.0000 mg | ORAL_TABLET | Freq: Every day | ORAL | Status: DC
  Administered 2021-06-27 – 2021-07-03 (×7): 40 mg via ORAL
  Filled 2021-06-27 (×8): qty 2

## 2021-06-27 MED ORDER — QUINAPRIL HCL 10 MG PO TABS: 40.0000 mg | ORAL_TABLET | Freq: Every day | ORAL | Status: DC

## 2021-06-27 MED ORDER — WARFARIN SODIUM 2.5 MG PO TABS
2.5000 mg | ORAL_TABLET | Freq: Once | ORAL | Status: AC
  Administered 2021-06-27: 2.5 mg via ORAL
  Filled 2021-06-27: qty 1

## 2021-06-27 NOTE — Progress Notes (Signed)
PROGRESS NOTE    Joseph [name].  ZOX:[id number] DOB: [birth year] DOA: 06/11/2021 PCP: Donald Prose, MD   Brief Narrative: Joseph [name]. is a 65 y.o. 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  Polyp patient presents secondary to fall at home resulting in left knee pain and found to have a left knee dislocation with associated left knee effusion with concern for possible hematoma in setting of supratherapeutic INR.  Orthopedic surgery was consulted and reduced patient's knee but recommended no surgical management secondary to patient's body habitus.  Patient required INR reversal with vitamin K and Coumadin was held secondary to concern for active bleeding.  PT/OT recommending rehab.  Coumadin restarted with heparin drip bridge.   Assessment & Plan:   Principal Problem:   Left knee dislocation, initial encounter Active Problems:   Pure hypercholesterolemia   Essential hypertension, benign   OSA (obstructive sleep apnea)   Hx pulmonary embolism   Esophageal reflux   Diabetes mellitus type 2, uncontrolled   Diabetic neuropathy (HCC)   Acute renal failure superimposed on stage 3b chronic kidney disease (HCC)   Normocytic anemia   Class 3 obesity (HCC)   Aortic atherosclerosis (HCC)   Coronary atherosclerosis   Effusion of left knee   Left knee dislocation   Left knee dislocation Secondary to fall. Associated fragments concerning for fracture seen on x-ray. Orthopedic surgery was consulted and reduced patient's knee with recommendations for nonweightbearing status of left leg in addition to strict use of an immobilizer and no ROM. No surgery recommended secondary to body habitus and leg wounds. PT/OT evaluating for rehab; SNF vs CIR. MRI obtained on 9/30 with evidence of significant ligamentous injury in  addition to lateral patellar dislocation. Discussed with Dr. Erlinda Hong on 9/30 who will discuss with Dr. Marlou Sa for recommendations -Oxycodone prn -Follow-up orthopedic surgery recommendations  Left knee effusion Possible left knee hematoma In setting of fall and trauma to knee while on Coumadin with supratherapeutic INR. Improving. No surgical intervention.  Right knee pain No obvious abnormalities. Right knee x-ray with small effusion, otherwise no osseous abnormalities.  Normocytic anemia In setting of CKD in addition to likely hematoma. Patient received 2 units of PRBC and 1 unit of FFP this admission. Hemoglobin stable.  AKI on CKD stage IIIb Baseline creatinine of about 1.4-1.5. Creatinine of 1.50 on admission with peak creatinine of 2.87. Possible AKI from ATN related to hypoperfusion from blood loss. Improved with IV fluids.  Hyperkalemia Resolved.  History of pulmonary embolism Patient is on Coumadin as an outpatient. Requiring bridge secondary to cessation of Coumadin and vitamin K INR reversal on admission for acute hemorrhage -Continue Coumadin per pharmacy (Goal INR of 2-3) -Continue heparin IV for bridge  CAD Asymptomatic.  Diabetic neuropathy Diabetes mellitus, type 2 Hemoglobin A1C of 6.1%. Patient is on metformin, Actos and Lantus as an outpatient. -Continue Semglee 50 units qHS, Novolog 3 units TID, SSI, Actos  GERD -Continue Protonix  OSA -Continue CPAP qHS  Primary hypertension Patient is on quinapril and amlodipine as an outpatient which were initially held this admission. Blood pressure uncontrolled. -Amlodipine 10 mg daily -Resume home quinapril  Hyperlipidemia Patient is on pravastatin as an outpatient which is held currently.  Constipation In setting of opiate use. -Continue Colace, Senokot, Miralax  Pressure injury Mid-nose, not present on admission. Secondary to CPAP mask  Obesity Body mass index is 63.41 kg/m.    DVT prophylaxis: Heparin  IV/Coumadin Code Status:   Code Status: Full Code Family Communication: None at bedside Disposition Plan: Discharge to SNF when bed is available   Consultants:  Orthopedic surgery  Procedures:  LEFT KNEE REDUCTION  Antimicrobials: Keflex    Subjective: No issues overnight.  Objective: Vitals:   06/26/21 2030 06/27/21 0509 06/27/21 0537 06/27/21 1322  BP: (!) 169/41 (!) 180/58 (!) 166/48 (!) 168/54  Pulse: 70 83  80  Resp: 18 18  16   Temp: 97.8 F (36.6 C) (!) 97.5 F (36.4 C)  98.3 F (36.8 C)  TempSrc:  Oral  Oral  SpO2: 98% 98%  97%  Weight:      Height:        Intake/Output Summary (Last 24 hours) at 06/27/2021 1429 Last data filed at 06/27/2021 1210 Gross per 24 hour  Intake 240 ml  Output 1300 ml  Net -1060 ml    Filed Weights   06/11/21 1326 06/25/21 0900  Weight: (!) 208.7 kg (!) 218 kg    Examination:  General exam: Appears calm and comfortable Respiratory system: Clear to auscultation. Respiratory effort normal. Cardiovascular system: S1 & S2 heard, RRR. No murmurs, rubs, gallops or clicks. Gastrointestinal system: Abdomen is obese, soft and nontender. No organomegaly or masses felt. Normal bowel sounds heard. Central nervous system: Alert and oriented. No focal neurological deficits. Musculoskeletal:  No calf tenderness. Left leg in immobilizer Skin: No cyanosis. Psychiatry: Judgement and insight appear normal. Mood & affect appropriate.    Data Reviewed: I have personally reviewed following labs and imaging studies  CBC Lab Results  Component Value Date   WBC 6.6 06/26/2021   RBC 3.60 (L) 06/26/2021   HGB 10.3 (L) 06/26/2021   HCT 34.2 (L) 06/26/2021   MCV 95.0 06/26/2021   MCH 28.6 06/26/2021   PLT 548 (H) 06/26/2021   MCHC 30.1 06/26/2021   RDW 16.4 (H) 06/26/2021   LYMPHSABS 1.7 06/17/2021   MONOABS 0.7 06/17/2021   EOSABS 0.4 06/17/2021   BASOSABS 0.1 67/34/1937     Last metabolic panel Lab Results  Component Value Date    NA 137 06/24/2021   K 4.7 06/24/2021   CL 105 06/24/2021   CO2 26 06/24/2021   BUN 37 (H) 06/24/2021   CREATININE 1.24 06/24/2021   GLUCOSE 235 (H) 06/24/2021   GFRNONAA >60 06/24/2021   GFRAA 51 (L) 04/05/2020   CALCIUM 8.5 (L) 06/24/2021   PHOS 2.8 04/05/2020   PROT 5.9 (L) 06/24/2021   ALBUMIN 2.4 (L) 06/24/2021   BILITOT 0.9 06/24/2021   ALKPHOS 89 06/24/2021   AST 16 06/24/2021   ALT 16 06/24/2021   ANIONGAP 6 06/24/2021    CBG (last 3)  Recent Labs    06/26/21 2059 06/27/21 0723 06/27/21 1151  GLUCAP 147* 205* 179*      GFR: Estimated Creatinine Clearance: 115 mL/min (by C-G formula based on SCr of 1.24 mg/dL).  Coagulation Profile: Recent Labs  Lab 06/23/21 0411 06/24/21 0507 06/25/21 0425 06/26/21 0452 06/27/21 0512  INR 1.7* 1.9* 2.0* 2.3* 2.5*     No results found for this or any previous visit (from the past 240 hour(s)).      Radiology Studies: MR KNEE LEFT WO CONTRAST  Result Date: 06/26/2021 CLINICAL DATA:  Tibial plateau fracture after fall. EXAM: MRI OF THE LEFT KNEE WITHOUT CONTRAST TECHNIQUE: Multiplanar, multisequence MR imaging of the knee was performed. No intravenous contrast was  administered. COMPARISON:  Left knee x-rays dated June 15, 2021. CT left knee dated June 11, 2021. FINDINGS: MENISCI Medial meniscus: Bucket-handle type tear with the posterior horn flipped and displaced anteriorly, lying superior to the anterior horn (series 14, image 14; series 11, image 17). Lateral meniscus: Free edge fraying of the posterior horn (series 14, image 22). LIGAMENTS Cruciates: The ACL is completely ruptured. The PCL appears intact but there is a displaced avulsion fracture of the posterior tibial spine at its attachment. Collaterals: The medial collateral ligament is completely ruptured. The fibular collateral ligament is avulsed from its femoral attachment. The biceps femoris tendon and iliotibial band are intact. CARTILAGE  Patellofemoral:  No chondral defect. Medial: Small partial-thickness cartilage defect over the posterior weight-bearing medial femoral condyle. Lateral:  No chondral defect. Joint:  Small joint effusion. Popliteal Fossa:  No Baker cyst.  Completely torn popliteus tendon. Extensor Mechanism: Completely ruptured MPFL and medial patellar retinaculum. Intact quadriceps tendon and patellar tendon. Bones: External rotation and subluxation of proximal tibia with respect to the distal femur, with near dislocation of the lateral compartment. Lateral dislocation of the patella. As above, avulsion fracture of the posterior tibial spine. Other: Large ill-defined fluid collection along the medial aspect of the distal femur containing fat, fluid, and hemorrhage, consistent with degloving injury. Complete tears of the medial and lateral gastrocnemius tendons from the distal femur. IMPRESSION: 1. Unstable knee joint with external rotation and subluxation of the proximal tibia, with near dislocation of the lateral compartment. Lateral dislocation of the patella. 2. Bucket-handle type tear of the medial meniscus with the posterior horn flipped and displaced anteriorly, lying superior to the anterior horn. 3. Complete tears of the ACL, MCL, and MPFL. Complete avulsions of the fibular collateral ligament and popliteus tendon from the distal femur. 4. Avulsion fracture of the tibial spine at the PCL attachment. 5. Complete tears of the medial and lateral gastrocnemius tendons from the distal femur. 6. Large degloving injury along the medial aspect of the distal femur. Electronically Signed   By: Titus Dubin M.D.   On: 06/26/2021 14:44        Scheduled Meds:  (feeding supplement) PROSource Plus  30 mL Oral BID BM   amLODipine  10 mg Oral Daily   cephALEXin  250 mg Oral QHS   docusate sodium  100 mg Oral BID   feeding supplement (GLUCERNA SHAKE)  237 mL Oral Q24H   FLUoxetine  40 mg Oral QPM   insulin aspart  0-20 Units  Subcutaneous TID WC   insulin aspart  0-5 Units Subcutaneous QHS   insulin aspart  3 Units Subcutaneous TID WC   insulin glargine-yfgn  50 Units Subcutaneous QHS   lisinopril  40 mg Oral Daily   methocarbamol  1,000 mg Oral BID   multivitamin with minerals  1 tablet Oral Daily   pantoprazole  40 mg Oral Daily   pioglitazone  45 mg Oral Daily   polyethylene glycol  17 g Oral BID   Ensure Max Protein  11 oz Oral Daily   senna  1 tablet Oral Daily   tamsulosin  0.4 mg Oral Daily   warfarin  2.5 mg Oral ONCE-1600   Warfarin - Pharmacist Dosing Inpatient   Does not apply q1600   Continuous Infusions:     LOS: 15 days     Cordelia Poche, MD [facility] 06/27/2021, 2:29 PM  If 7PM-7AM, please contact night-coverage [email or web address]

## 2021-06-27 NOTE — Progress Notes (Signed)
[place] for Warfarin Indication: h/o pulmonary embolus  Allergies  Allergen Reactions   Tape     Peels skin on legs   Chlorhexidine Rash    Wipes    Patient Measurements: Height: 6\' 1"  (185.4 cm) Weight: (!) 218 kg (480 lb 9.6 oz) IBW/kg (Calculated) : 79.9 Heparin Dosing Weight: 132.5 kg  Vital Signs: Temp: 97.5 F (36.4 C) (10/01 0509) Temp Source: Oral (10/01 0509) BP: 166/48 (10/01 0537) Pulse Rate: 83 (10/01 0509)  Labs: Recent Labs    06/25/21 0425 06/26/21 0452 06/27/21 0512  HGB 9.6* 10.3*  --   HCT 32.1* 34.2*  --   PLT 516* 548*  --   LABPROT 23.0* 25.1* 27.1*  INR 2.0* 2.3* 2.5*  HEPARINUNFRC 0.34 0.51  --      Estimated Creatinine Clearance: 115 mL/min (by C-G formula based on SCr of 1.24 mg/dL).  Medications:  PTA warfarin dosing:  Take 5 mg in the evening on Sun, Mon, Wed, and Fri. Take 7.5 mg in the evening on Tue, Thur, and Sat [facility] Must   INR was SUPRAtherapeutic (6.2) on admission  Assessment: 65 y/o M on lifelong anticoagulation with warfarin for h/o PE, body habitus, and sedentary lifestyle admitted with left knee dislocation and effusion concerning for hematoma. On admission - warfarin was held, vitamin K was given for supratherapeutic INR, and pt was transfused. Ortho following - no surgical intervention planned.   Pharmacy consulted to resume warfarin on 9/23 with heparin bridge while INR subtherapeutic.   Significant Events: -Warfarin held from 9/15 - 9/22 -Vitamin K given on 9/15, 9/16, and 9/17  Today, 06/27/21 Heparin stopped yesterday with therapeutic INR x 2 days > 5 days after Coumadin resumed INR = 2.5 CBC: Hgb improving; Plt elevated (9/30) Continues to have bruising, but no further bleeding reported from wounds or elsewhere   Goal of Therapy:  INR 2-3 Heparin level 0.3-0.7 units/ml Monitor platelets by anticoagulation protocol: Yes   Plan:  Warfarin 2.5 mg PO once  today Would recommend dosing less than or equal to warfarin 5 mg daily upon discharge d/t supratherapeutic INR on previous dose resulting in blood loss. Recommend f/u INR 1 week at longest INR daily Monitor for signs of bleeding  Napoleon Form  06/27/21 11:46 AM

## 2021-06-28 DIAGNOSIS — S83105A Unspecified dislocation of left knee, initial encounter: Secondary | ICD-10-CM | POA: Diagnosis not present

## 2021-06-28 DIAGNOSIS — E1165 Type 2 diabetes mellitus with hyperglycemia: Secondary | ICD-10-CM | POA: Diagnosis not present

## 2021-06-28 DIAGNOSIS — I10 Essential (primary) hypertension: Secondary | ICD-10-CM | POA: Diagnosis not present

## 2021-06-28 DIAGNOSIS — L8990 Pressure ulcer of unspecified site, unspecified stage: Secondary | ICD-10-CM | POA: Insufficient documentation

## 2021-06-28 LAB — GLUCOSE, CAPILLARY
Glucose-Capillary: 132 mg/dL — ABNORMAL HIGH (ref 70–99)
Glucose-Capillary: 124 mg/dL — ABNORMAL HIGH (ref 70–99)
Glucose-Capillary: 169 mg/dL — ABNORMAL HIGH (ref 70–99)
Glucose-Capillary: 190 mg/dL — ABNORMAL HIGH (ref 70–99)

## 2021-06-28 LAB — PROTIME-INR
INR: 2.3 — ABNORMAL HIGH (ref 0.8–1.2)
Prothrombin Time: 25 seconds — ABNORMAL HIGH (ref 11.4–15.2)

## 2021-06-28 MED ORDER — WARFARIN SODIUM 5 MG PO TABS
5.0000 mg | ORAL_TABLET | Freq: Once | ORAL | Status: AC
  Administered 2021-06-28: 5 mg via ORAL
  Filled 2021-06-28: qty 1

## 2021-06-28 NOTE — Progress Notes (Signed)
I have reviewed the recent MRI which shows severe multi ligamentous injuries. I have also discussed this case with my partner Dr. Marlou Sa who has agreed to lend his expertise on this matter. He will evaluate the patient and offer his opinion on and how best to treat this injury.

## 2021-06-28 NOTE — Progress Notes (Signed)
Patient seen and examined I was asked by Dr. Sherrian Divers to offer my opinion about this patient who has a low-energy left knee dislocation Patient was reduced on 06/11/2021 in the emergency department Radiographs 06/15/2021 show maintenance of reduction. Patient has since felt multiple pops in the left knee.  He is nonweightbearing.  He is morbidly obese with history of DVT affecting the left lower extremity with subsequent pulmonary embolism. He has been on Coumadin current INR 2.3 MRI scan shows persistent rotation of the tibia relative to the femur with dislocation of the patella and multiple ligament injuries Significant hematoma is also present On examination the patient has venous stasis changes in both lower extremities with edema in both feet.  Both feet are perfused and warm but pedal pulses are.difficult to palpate due to swelling in both feet.  Ankle dorsiflexion intact and compartments soft on the left.  Large hematoma/eschar present in the distal medial aspect of the femoral region.  This measures about 12 x 12 cm. Patient does not appear to be in too much pain Impression is recurrent subluxation and external rotation of the tibia relative to the femur.  Patella is currently dislocated. Relatively high chance of limb loss in this particular situation. Best outcome would be stiff knee the patient can ambulate on In a limited fashion. Recommendation at this time would be for discontinuation of Coumadin with subsequent Lovenox or heparin bridge and return to the OR for repeat reduction and placement of external fixator. The issue with the external fixator is that the tibial pins would necessarily go through the venous stasis region on the tibia and would likely be affected by the pannus and there proximal and lateral location to avoid the underlying hematoma and developing eschar. I did discuss with the patient and his wife the difficulty in managing this current situation.  Nonetheless the way it  stands now with persistent subluxation of the tibia relative to the femur ambulation will not be a possibility and limb loss more likely.  For that reason I have believe it is indicated to attempt repeat closed reduction with maintenance of the reduction with external fixator for approximately 6 weeks.  The risk and benefits of that are discussed with the patient and his wife and they include but not limited to pin site infection recurrent subluxation as well as inability to achieve a reduction.  We may need to address the hematoma at that time with wound VAC placement and decompression as well.  Anticipate surgery sometime in the near future once his INR gets below 1.5.

## 2021-06-28 NOTE — Progress Notes (Signed)
PROGRESS NOTE    [name].  SNK:[id number] DOB: 10/31/1955 DOA: 06/11/2021 PCP: Donald Prose, MD   Brief Narrative: Gaston Dase. is a 65 y.o. 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  Polyp patient presents secondary to fall at home resulting in left knee pain and found to have a left knee dislocation with associated left knee effusion with concern for possible hematoma in setting of supratherapeutic INR.  Orthopedic surgery was consulted and reduced patient's knee but recommended no surgical management secondary to patient's body habitus.  Patient required INR reversal with vitamin K and Coumadin was held secondary to concern for active bleeding.  PT/OT recommending rehab.  Coumadin restarted with heparin drip bridge.   Assessment & Plan:   Principal Problem:   Left knee dislocation, initial encounter Active Problems:   Pure hypercholesterolemia   Essential hypertension, benign   OSA (obstructive sleep apnea)   Hx pulmonary embolism   Esophageal reflux   Diabetes mellitus type 2, uncontrolled   Diabetic neuropathy (HCC)   Acute renal failure superimposed on stage 3b chronic kidney disease (HCC)   Normocytic anemia   Class 3 obesity (HCC)   Aortic atherosclerosis (HCC)   Coronary atherosclerosis   Effusion of left knee   Left knee dislocation   Pressure injury of skin   Left knee dislocation Secondary to fall. Associated fragments concerning for fracture seen on x-ray. Orthopedic surgery was consulted and reduced patient's knee with recommendations for nonweightbearing status of left leg in addition to strict use of an immobilizer and no ROM. No surgery recommended secondary to body habitus and leg wounds. PT/OT evaluating for rehab; SNF vs CIR. MRI obtained on 9/30 with evidence of  significant ligamentous injury in addition to lateral patellar dislocation. Discussed with Dr. Erlinda Hong on 9/30 who will discuss with Dr. Marlou Sa for recommendations -Oxycodone prn -Follow-up orthopedic surgery recommendations  Left knee effusion Possible left knee hematoma In setting of fall and trauma to knee while on Coumadin with supratherapeutic INR. Improving. No surgical intervention.  Right knee pain No obvious abnormalities. Right knee x-ray with small effusion, otherwise no osseous abnormalities.  Normocytic anemia In setting of CKD in addition to likely hematoma. Patient received 2 units of PRBC and 1 unit of FFP this admission. Hemoglobin stable.  AKI on CKD stage IIIb Baseline creatinine of about 1.4-1.5. Creatinine of 1.50 on admission with peak creatinine of 2.87. Possible AKI from ATN related to hypoperfusion from blood loss. Improved with IV fluids.  Hyperkalemia Resolved.  History of pulmonary embolism Patient is on Coumadin as an outpatient. Requiring bridge secondary to cessation of Coumadin and vitamin K INR reversal on admission for acute hemorrhage. INR therapeutic. -Continue Coumadin per pharmacy (Goal INR of 2-3)  CAD Asymptomatic.  Diabetic neuropathy Diabetes mellitus, type 2 Hemoglobin A1C of 6.1%. Patient is on metformin, Actos and Lantus as an outpatient. -Continue Semglee 50 units qHS, Novolog 3 units TID, SSI, Actos  GERD -Continue Protonix  OSA -Continue CPAP qHS  Primary hypertension Patient is on quinapril and amlodipine as an outpatient which were initially held this admission. Blood pressure uncontrolled. -Amlodipine 10 mg daily and lisinopril 40 mg daily (substituted for home quinapril while inpatient)  Hyperlipidemia Patient is on pravastatin as an outpatient which is held currently.  Constipation In setting of opiate use. -Continue Colace, Senokot, Miralax  Pressure  injury Mid-nose, not present on admission. Secondary to CPAP  mask  Obesity Body mass index is 63.41 kg/m.    DVT prophylaxis: Heparin IV/Coumadin Code Status:   Code Status: Full Code Family Communication: Wife at bedside Disposition Plan: Discharge to SNF when bed is available   Consultants:  Orthopedic surgery  Procedures:  LEFT KNEE REDUCTION  Antimicrobials: Keflex    Subjective: No issues today.  Objective: Vitals:   06/28/21 0419 06/28/21 0945 06/28/21 1130 06/28/21 1131  BP: (!) 191/44 (!) 149/46 (!) 156/56 (!) 156/56  Pulse: 73  75 77  Resp: 20   16  Temp: 98 F (36.7 C)  97.9 F (36.6 C)   TempSrc:   Oral   SpO2: 93%  92% 97%  Weight:      Height:        Intake/Output Summary (Last 24 hours) at 06/28/2021 1408 Last data filed at 06/28/2021 1151 Gross per 24 hour  Intake --  Output 1400 ml  Net -1400 ml    Filed Weights   06/11/21 1326 06/25/21 0900  Weight: (!) 208.7 kg (!) 218 kg    Examination:  General exam: Appears calm and comfortable Respiratory system: Clear to auscultation. Respiratory effort normal. Cardiovascular system: S1 & S2 heard, RRR. No murmurs, rubs, gallops or clicks. Gastrointestinal system: Abdomen is obese, soft and nontender. No organomegaly or masses felt. Normal bowel sounds heard. Central nervous system: Alert and oriented. No focal neurological deficits. Skin: No cyanosis. Psychiatry: Judgement and insight appear normal. Mood & affect appropriate.    Data Reviewed: I have personally reviewed following labs and imaging studies  CBC Lab Results  Component Value Date   WBC 6.6 06/26/2021   RBC 3.60 (L) 06/26/2021   HGB 10.3 (L) 06/26/2021   HCT 34.2 (L) 06/26/2021   MCV 95.0 06/26/2021   MCH 28.6 06/26/2021   PLT 548 (H) 06/26/2021   MCHC 30.1 06/26/2021   RDW 16.4 (H) 06/26/2021   LYMPHSABS 1.7 06/17/2021   MONOABS 0.7 06/17/2021   EOSABS 0.4 06/17/2021   BASOSABS 0.1 19/37/9024     Last metabolic panel Lab Results  Component Value Date   NA 137 06/24/2021    K 4.7 06/24/2021   CL 105 06/24/2021   CO2 26 06/24/2021   BUN 37 (H) 06/24/2021   CREATININE 1.24 06/24/2021   GLUCOSE 235 (H) 06/24/2021   GFRNONAA >60 06/24/2021   GFRAA 51 (L) 04/05/2020   CALCIUM 8.5 (L) 06/24/2021   PHOS 2.8 04/05/2020   PROT 5.9 (L) 06/24/2021   ALBUMIN 2.4 (L) 06/24/2021   BILITOT 0.9 06/24/2021   ALKPHOS 89 06/24/2021   AST 16 06/24/2021   ALT 16 06/24/2021   ANIONGAP 6 06/24/2021    CBG (last 3)  Recent Labs    06/27/21 2047 06/28/21 0742 06/28/21 1131  GLUCAP 114* 132* 124*      GFR: Estimated Creatinine Clearance: 115 mL/min (by C-G formula based on SCr of 1.24 mg/dL).  Coagulation Profile: Recent Labs  Lab 06/24/21 0507 06/25/21 0425 06/26/21 0452 06/27/21 0512 06/28/21 0456  INR 1.9* 2.0* 2.3* 2.5* 2.3*     No results found for this or any previous visit (from the past 240 hour(s)).      Radiology Studies: No results found.      Scheduled Meds:  (feeding supplement) PROSource Plus  30 mL Oral BID BM   amLODipine  10 mg Oral Daily   cephALEXin  250 mg Oral QHS   docusate sodium  100 mg Oral BID   feeding supplement (GLUCERNA SHAKE)  237 mL Oral Q24H   FLUoxetine  40 mg Oral QPM   insulin aspart  0-20 Units Subcutaneous TID WC   insulin aspart  0-5 Units Subcutaneous QHS   insulin aspart  3 Units Subcutaneous TID WC   insulin glargine-yfgn  50 Units Subcutaneous QHS   lisinopril  40 mg Oral Daily   methocarbamol  1,000 mg Oral BID   multivitamin with minerals  1 tablet Oral Daily   pantoprazole  40 mg Oral Daily   pioglitazone  45 mg Oral Daily   polyethylene glycol  17 g Oral BID   Ensure Max Protein  11 oz Oral Daily   senna  1 tablet Oral Daily   tamsulosin  0.4 mg Oral Daily   warfarin  5 mg Oral ONCE-1600   Warfarin - Pharmacist Dosing Inpatient   Does not apply q1600   Continuous Infusions:     LOS: 16 days     Cordelia Poche, MD [facility] 06/28/2021, 2:08 PM  If 7PM-7AM, please  contact night-coverage [email or web address]

## 2021-06-28 NOTE — Progress Notes (Addendum)
[place] for Warfarin Indication: h/o pulmonary embolus  Allergies  Allergen Reactions   Tape     Peels skin on legs   Chlorhexidine Rash    Wipes    Patient Measurements: Height: 6\' 1"  (185.4 cm) Weight: (!) 218 kg (480 lb 9.6 oz) IBW/kg (Calculated) : 79.9 Heparin Dosing Weight: 132.5 kg  Vital Signs: Temp: 97.9 F (36.6 C) (10/02 1130) Temp Source: Oral (10/02 1130) BP: 156/56 (10/02 1131) Pulse Rate: 77 (10/02 1131)  Labs: Recent Labs    06/26/21 0452 06/27/21 0512 06/28/21 0456  HGB 10.3*  --   --   HCT 34.2*  --   --   PLT 548*  --   --   LABPROT 25.1* 27.1* 25.0*  INR 2.3* 2.5* 2.3*  HEPARINUNFRC 0.51  --   --      Estimated Creatinine Clearance: 115 mL/min (by C-G formula based on SCr of 1.24 mg/dL).  Medications:  PTA warfarin dosing:  Take 5 mg in the evening on Sun, Mon, Wed, and Fri. Take 7.5 mg in the evening on Tue, Thur, and Sat Tennis Must   INR was SUPRAtherapeutic (6.2) on admission  Assessment: 65 y/o M on lifelong anticoagulation with warfarin for h/o PE, body habitus, and sedentary lifestyle admitted with left knee dislocation and effusion concerning for hematoma. On admission - warfarin was held, vitamin K was given for supratherapeutic INR, and pt was transfused. Ortho following - no surgical intervention planned.   Pharmacy consulted to resume warfarin on 9/23 with heparin bridge while INR subtherapeutic.   Significant Events: -Warfarin held from 9/15 - 9/22 -Vitamin K given on 9/15, 9/16, and 9/17  Today, 06/28/21 Heparin stopped yesterday with therapeutic INR x 2 days > 5 days after Coumadin resumed INR = 2.3 CBC: Hgb improving; Plt elevated (9/30) Continues to have bruising, but no further bleeding reported from wounds or elsewhere   Goal of Therapy:  INR 2-3 Heparin level 0.3-0.7 units/ml Monitor platelets by anticoagulation protocol: Yes   Plan:  Warfarin 5 mg PO once today Would  recommend dosing less than or equal to warfarin 5 mg daily upon discharge d/t supratherapeutic INR on previous dose resulting in blood loss. Recommend f/u INR 1 week at longest INR daily Monitor for signs of bleeding  Napoleon Form  06/28/21 12:04 PM  Addendum:   Surgery is now planning for procedure later this week. Plan is to d/c warfarin and bridge with either heparin or lovenox. With hematoma, will most likely bridge with heparin for ease of reversal. Will f/u INR and resume heparin infusion when INR < or = 2.   Ulice Dash D  06/28/21 5:28 PM

## 2021-06-29 DIAGNOSIS — I10 Essential (primary) hypertension: Secondary | ICD-10-CM | POA: Diagnosis not present

## 2021-06-29 DIAGNOSIS — E1165 Type 2 diabetes mellitus with hyperglycemia: Secondary | ICD-10-CM | POA: Diagnosis not present

## 2021-06-29 DIAGNOSIS — S83105A Unspecified dislocation of left knee, initial encounter: Secondary | ICD-10-CM | POA: Diagnosis not present

## 2021-06-29 LAB — CBC
HCT: 34.7 % — ABNORMAL LOW (ref 39.0–52.0)
Hemoglobin: 10.5 g/dL — ABNORMAL LOW (ref 13.0–17.0)
MCH: 28.6 pg (ref 26.0–34.0)
MCHC: 30.3 g/dL (ref 30.0–36.0)
MCV: 94.6 fL (ref 80.0–100.0)
Platelets: 430 10*3/uL — ABNORMAL HIGH (ref 150–400)
RBC: 3.67 MIL/uL — ABNORMAL LOW (ref 4.22–5.81)
RDW: 16.9 % — ABNORMAL HIGH (ref 11.5–15.5)
WBC: 5.7 10*3/uL (ref 4.0–10.5)
nRBC: 0 % (ref 0.0–0.2)

## 2021-06-29 LAB — GLUCOSE, CAPILLARY
Glucose-Capillary: 135 mg/dL — ABNORMAL HIGH (ref 70–99)
Glucose-Capillary: 125 mg/dL — ABNORMAL HIGH (ref 70–99)
Glucose-Capillary: 81 mg/dL (ref 70–99)
Glucose-Capillary: 201 mg/dL — ABNORMAL HIGH (ref 70–99)

## 2021-06-29 LAB — PROTIME-INR
INR: 2.2 — ABNORMAL HIGH (ref 0.8–1.2)
Prothrombin Time: 24.6 seconds — ABNORMAL HIGH (ref 11.4–15.2)

## 2021-06-29 NOTE — Progress Notes (Signed)
Inpatient Rehab Admissions Coordinator:   I do not have a CIR bed for this Pt. Today. CIR continues to follow for progress and participation with therapies as Pt. Currently is not able to demonstrate tolerance for 3 hours of therapy  in order to obtain insurance auth.   Clemens Catholic, [place], [place] Admissions Coordinator  [phone] ([place]) [phone] (office)

## 2021-06-29 NOTE — Progress Notes (Signed)
Nutrition Follow-up  DOCUMENTATION CODES:  Morbid obesity  INTERVENTION:  Continue ProSource Plus BID.  Continue Glucerna shake daily.  Continue Ensure Max daily.  Continue MVI with minerals daily.  NUTRITION DIAGNOSIS:  Inadequate oral intake related to decreased appetite as evidenced by per patient/family report. - ongoing  GOAL: Patient will meet greater than or equal to 90% of their needs - consistently meeting  MONITOR:  PO intake, Supplement acceptance, Labs, Weight trends, Skin  REASON FOR ASSESSMENT:  Consult Assessment of nutrition requirement/status  ASSESSMENT:  65 y.o. male with medical history of morbid obesity, anxiety, depression, hx L knee DVT, PE (on chronic Coumadin), GERD, HTN, IDA, cervical spine stenosis, non-specific myalgias/myositis, OSA on CPAP, PVD, polyneuropathy, type 2 DM, HLD, and multiple falls. He presented to the ED due to a fall on 9/15. He was admitted for pain control and to monitor L leg for development of compartment syndrome.  Orthopedic surgery recommendations: plan for external fixation of LLE knee dislocation this week once INR less than 1.5  Per Epic, pt eating between 75-100% of most meals. Pt's limited documentation recently.   Admit wt: 208.7 kg Current wt: 218 kg  Continue current nutrition plan.  Supplements: ProSource Plus BID, Glucerna shake daily (refused), Ensure Max daily  Medications: reviewed; colace BID, SSI, Semglee, MVI with minerals, Protonix, miralax, Senokot, Dulcolax suppository PRN (given once today), oxycodone PO PRN (given once daily)  Labs: reviewed; CBG 124-190 (H) HbA1c: 6.1%  Diet Order:   Diet Order             Diet heart healthy/carb modified Room service appropriate? Yes; Fluid consistency: Thin  Diet effective now                  EDUCATION NEEDS:  No education needs have been identified at this time  Skin:  Skin Assessment: Skin Integrity Issues: Skin Integrity Issues:: Other  (Comment) Other: venous stasis ulcer to L pre-tibial area  Last BM:  06/21/21  Height:  Ht Readings from Last 1 Encounters:  06/11/21 6\' 1"  (1.854 m)   Weight:  Wt Readings from Last 1 Encounters:  06/25/21 (!) 218 kg   Ideal Body Weight:  83.6 kg  BMI:  Body mass index is 63.41 kg/m.  Estimated Nutritional Needs:  Kcal:  2300-2500 kcal Protein:  115-135 grams Fluid:  >/= 2.4 L/day  Derrel Nip, RD, LDN (she/her/hers) Registered Dietitian I After-Hours/Weekend Pager # in [place]

## 2021-06-29 NOTE — Plan of Care (Signed)

## 2021-06-29 NOTE — Progress Notes (Signed)
[place] for Warfarin Indication: h/o pulmonary embolus  Allergies  Allergen Reactions   Tape     Peels skin on legs   Chlorhexidine Rash    Wipes    Patient Measurements: Height: 6\' 1"  (185.4 cm) Weight: (!) 218 kg (480 lb 9.6 oz) IBW/kg (Calculated) : 79.9 Heparin Dosing Weight: 132.5 kg  Vital Signs: Temp: 97.7 F (36.5 C) (10/03 0529) Temp Source: Oral (10/03 0529) BP: 158/53 (10/03 0529) Pulse Rate: 71 (10/03 0529)  Labs: Recent Labs    06/27/21 0512 06/28/21 0456 06/29/21 0435  HGB  --   --  10.5*  HCT  --   --  34.7*  PLT  --   --  430*  LABPROT 27.1* 25.0* 24.6*  INR 2.5* 2.3* 2.2*     Estimated Creatinine Clearance: 115 mL/min (by C-G formula based on SCr of 1.24 mg/dL).  Medications:  PTA warfarin dosing:  Take 5 mg in the evening on Sun, Mon, Wed, and Fri. Take 7.5 mg in the evening on Tue, Thur, and Sat Tennis Must   INR was SUPRAtherapeutic (6.2) on admission  Assessment: 65 y/o M on lifelong anticoagulation with warfarin for h/o PE, body habitus, and sedentary lifestyle admitted with left knee dislocation and effusion concerning for hematoma. On admission - warfarin was held, vitamin K was given for supratherapeutic INR, and pt was transfused. Ortho following - no surgical intervention planned.   Pharmacy was consulted to resume warfarin on 9/23 with heparin bridge while INR subtherapeutic. Surgery is now planning for procedure when INR < 1.5. Plan is to d/c warfarin and bridge with either heparin or lovenox. With hematoma, will most likely bridge with heparin for ease of reversal. Will f/u INR and resume heparin infusion when INR < or = 2.   Significant Events: -Warfarin held from 9/15 - 9/22 -Vitamin K given on 9/15, 9/16, and 9/17 - Heparin stopped 9/30  Today, 06/29/21 INR = 2.2 CBC: Hgb improving; Plt elevated Continues to have bruising, but no further bleeding reported from wounds or elsewhere    Goal of Therapy:  INR 2-3 Heparin level 0.3-0.7 units/ml Monitor platelets by anticoagulation protocol: Yes   Plan:  Hold Warfarin Initiate Heparin IV when INR < 2 INR daily Monitor for signs of bleeding  Gretta Arab PharmD, BCPS Clinical Pharmacist [facility] main pharmacy [phone] 06/29/2021 7:20 AM

## 2021-06-29 NOTE — Progress Notes (Signed)
PROGRESS NOTE    Joseph [name].  NWG:[id number] DOB: 1956-03-17 DOA: 06/11/2021 PCP: Donald Prose, MD   Brief Narrative: Joseph Kuhnle. is a 65 y.o. 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  [place] patient presents secondary to fall at home resulting in left knee pain and found to have a left knee dislocation with associated left knee effusion with concern for possible hematoma in setting of supratherapeutic INR.  Orthopedic surgery was consulted and reduced patient's knee but recommended no surgical management secondary to patient's body habitus.  Patient required INR reversal with vitamin K and Coumadin was held secondary to concern for active bleeding.  PT/OT recommending rehab.  Coumadin restarted with heparin drip bridge.   Assessment & Plan:   Principal Problem:   Left knee dislocation, initial encounter Active Problems:   Pure hypercholesterolemia   Essential hypertension, benign   OSA (obstructive sleep apnea)   Hx pulmonary embolism   Esophageal reflux   Diabetes mellitus type 2, uncontrolled   Diabetic neuropathy (HCC)   Acute renal failure superimposed on stage 3b chronic kidney disease (HCC)   Normocytic anemia   Class 3 obesity (HCC)   Aortic atherosclerosis (HCC)   Coronary atherosclerosis   Effusion of left knee   Left knee dislocation   Pressure injury of skin   Left knee dislocation Secondary to fall. Associated fragments concerning for fracture seen on x-ray. Orthopedic surgery was consulted and reduced patient's knee with recommendations for nonweightbearing status of left leg in addition to strict use of an immobilizer and no ROM. No surgery recommended secondary to body habitus and leg wounds. PT/OT evaluating for rehab; SNF vs CIR. MRI obtained on 9/30 with evidence of  significant ligamentous injury in addition to lateral patellar dislocation. Discussed with Dr. Erlinda Hong on 9/30 who will discuss with Dr. Marlou Sa for recommendations -Oxycodone prn -Orthopedic surgery recommendations: plan for external fixation of LLE knee dislocation this week once INR less than 1.5  Left knee effusion Possible left knee hematoma In setting of fall and trauma to knee while on Coumadin with supratherapeutic INR. Improving. No surgical intervention.  Right knee pain No obvious abnormalities. Right knee x-ray with small effusion, otherwise no osseous abnormalities.  Normocytic anemia In setting of CKD in addition to likely hematoma. Patient received 2 units of PRBC and 1 unit of FFP this admission. Hemoglobin stable.  AKI on CKD stage IIIb Baseline creatinine of about 1.4-1.5. Creatinine of 1.50 on admission with peak creatinine of 2.87. Possible AKI from ATN related to hypoperfusion from blood loss. Improved with IV fluids.  Hyperkalemia Resolved.  History of pulmonary embolism Patient is on Coumadin as an outpatient. Requiring bridge secondary to cessation of Coumadin and vitamin K INR reversal on admission for acute hemorrhage. Plan for surgery and INR goal less than 1.5. Coumadin discontinued 10/2 -Hold Coumadin -Heparin IV once INR less than 2; per pharmacy  Bradycardia Occurred overnight on 10/3. Not on telemetry. Asymptomatic. In setting of known sleep apnea. -Telemetry for 24-48 hours -EKG to check for heart block  CAD Asymptomatic.  Diabetic neuropathy Diabetes mellitus, type 2 Hemoglobin A1C of 6.1%. Patient is on metformin, Actos and Lantus as an outpatient. -Continue Semglee 50 units qHS, Novolog 3 units TID, SSI, Actos  GERD -Continue Protonix  OSA -Continue CPAP qHS  Primary hypertension Patient is on quinapril and amlodipine  as an outpatient which were initially held this admission. Blood pressure uncontrolled. -Amlodipine 10 mg daily and lisinopril  40 mg daily (substituted for home quinapril while inpatient)  Hyperlipidemia Patient is on pravastatin as an outpatient which is held currently.  Constipation In setting of opiate use. -Continue Colace, Senokot, Miralax -Dulcolax suppository prn  Pressure injury Mid-nose, not present on admission. Secondary to CPAP mask  Obesity Body mass index is 63.41 kg/m.    DVT prophylaxis: Heparin IV/Coumadin Code Status:   Code Status: Full Code Family Communication: Wife at bedside Disposition Plan: Discharge to SNF when bed is available   Consultants:  Orthopedic surgery  Procedures:  LEFT KNEE REDUCTION  Antimicrobials: Keflex    Subjective: Some low heart rate overnight to low 40s, high 30s. Asymptomatic  Objective: Vitals:   06/28/21 1730 06/28/21 2130 06/29/21 0131 06/29/21 0529  BP:  (!) 158/54 (!) 149/51 (!) 158/53  Pulse: (!) 41 79 70 71  Resp:  20 18 18   Temp:  98.2 F (36.8 C) (!) 97.5 F (36.4 C) 97.7 F (36.5 C)  TempSrc:  Oral Oral Oral  SpO2:  98% 95% 97%  Weight:      Height:        Intake/Output Summary (Last 24 hours) at 06/29/2021 1130 Last data filed at 06/29/2021 0533 Gross per 24 hour  Intake --  Output 950 ml  Net -950 ml    Filed Weights   06/11/21 1326 06/25/21 0900  Weight: (!) 208.7 kg (!) 218 kg    Examination:  General exam: Appears calm and comfortable Respiratory system: Clear to auscultation. Respiratory effort normal. Cardiovascular system: S1 & S2 heard, RRR. No murmurs, rubs, gallops or clicks. Gastrointestinal system: Abdomen is nondistended, soft and nontender. No organomegaly or masses felt. Normal bowel sounds heard. Central nervous system: Alert and oriented. No focal neurological deficits. Psychiatry: Judgement and insight appear normal. Mood & affect appropriate.    Data Reviewed: I have personally reviewed following labs and imaging studies  CBC Lab Results  Component Value Date   WBC 5.7 06/29/2021   RBC  3.67 (L) 06/29/2021   HGB 10.5 (L) 06/29/2021   HCT 34.7 (L) 06/29/2021   MCV 94.6 06/29/2021   MCH 28.6 06/29/2021   PLT 430 (H) 06/29/2021   MCHC 30.3 06/29/2021   RDW 16.9 (H) 06/29/2021   LYMPHSABS 1.7 06/17/2021   MONOABS 0.7 06/17/2021   EOSABS 0.4 06/17/2021   BASOSABS 0.1 62/37/6283     Last metabolic panel Lab Results  Component Value Date   NA 137 06/24/2021   K 4.7 06/24/2021   CL 105 06/24/2021   CO2 26 06/24/2021   BUN 37 (H) 06/24/2021   CREATININE 1.24 06/24/2021   GLUCOSE 235 (H) 06/24/2021   GFRNONAA >60 06/24/2021   GFRAA 51 (L) 04/05/2020   CALCIUM 8.5 (L) 06/24/2021   PHOS 2.8 04/05/2020   PROT 5.9 (L) 06/24/2021   ALBUMIN 2.4 (L) 06/24/2021   BILITOT 0.9 06/24/2021   ALKPHOS 89 06/24/2021   AST 16 06/24/2021   ALT 16 06/24/2021   ANIONGAP 6 06/24/2021    CBG (last 3)  Recent Labs    06/28/21 1751 06/28/21 2126 06/29/21 0747  GLUCAP 169* 190* 135*      GFR: Estimated Creatinine Clearance: 115 mL/min (by C-G formula based on SCr of 1.24 mg/dL).  Coagulation Profile: Recent Labs  Lab 06/25/21 0425 06/26/21 0452 06/27/21 0512 06/28/21 0456 06/29/21 0435  INR 2.0* 2.3* 2.5* 2.3* 2.2*  No results found for this or any previous visit (from the past 240 hour(s)).      Radiology Studies: No results found.      Scheduled Meds:  (feeding supplement) PROSource Plus  30 mL Oral BID BM   amLODipine  10 mg Oral Daily   cephALEXin  250 mg Oral QHS   docusate sodium  100 mg Oral BID   feeding supplement (GLUCERNA SHAKE)  237 mL Oral Q24H   FLUoxetine  40 mg Oral QPM   insulin aspart  0-20 Units Subcutaneous TID WC   insulin aspart  0-5 Units Subcutaneous QHS   insulin aspart  3 Units Subcutaneous TID WC   insulin glargine-yfgn  50 Units Subcutaneous QHS   lisinopril  40 mg Oral Daily   methocarbamol  1,000 mg Oral BID   multivitamin with minerals  1 tablet Oral Daily   pantoprazole  40 mg Oral Daily   pioglitazone  45 mg  Oral Daily   polyethylene glycol  17 g Oral BID   Ensure Max Protein  11 oz Oral Daily   senna  1 tablet Oral Daily   tamsulosin  0.4 mg Oral Daily   Continuous Infusions:     LOS: 17 days     Cordelia Poche, MD [facility] 06/29/2021, 11:30 AM  If 7PM-7AM, please contact night-coverage [email or web address]

## 2021-06-29 NOTE — Progress Notes (Signed)
PT Cancellation Note  Patient Details Name: Joseph [name]. MRN: [id number] DOB: Sep 29, 1955   Cancelled Treatment:    Reason Eval/Treat Not Completed: Medical issues which prohibited therapy, Orthopedic note reviewed. Given patient will require further reduction of L knee joint dislocation and external fixator, PT will hold on mobility due to significant risks for vascular compromise until L knee is more stable per ortho.    Tresa Endo [place] Pager [phone] Office [phone]  06/29/2021, 1:28 PM

## 2021-06-29 NOTE — Progress Notes (Signed)
Pt is wearing home cpap for the night

## 2021-06-30 DIAGNOSIS — E1165 Type 2 diabetes mellitus with hyperglycemia: Secondary | ICD-10-CM | POA: Diagnosis not present

## 2021-06-30 DIAGNOSIS — S83105A Unspecified dislocation of left knee, initial encounter: Secondary | ICD-10-CM | POA: Diagnosis not present

## 2021-06-30 DIAGNOSIS — I10 Essential (primary) hypertension: Secondary | ICD-10-CM | POA: Diagnosis not present

## 2021-06-30 LAB — GLUCOSE, CAPILLARY
Glucose-Capillary: 120 mg/dL — ABNORMAL HIGH (ref 70–99)
Glucose-Capillary: 188 mg/dL — ABNORMAL HIGH (ref 70–99)
Glucose-Capillary: 145 mg/dL — ABNORMAL HIGH (ref 70–99)
Glucose-Capillary: 171 mg/dL — ABNORMAL HIGH (ref 70–99)

## 2021-06-30 LAB — CBC
HCT: 35.8 % — ABNORMAL LOW (ref 39.0–52.0)
Hemoglobin: 10.9 g/dL — ABNORMAL LOW (ref 13.0–17.0)
MCH: 29 pg (ref 26.0–34.0)
MCHC: 30.4 g/dL (ref 30.0–36.0)
MCV: 95.2 fL (ref 80.0–100.0)
Platelets: 413 10*3/uL — ABNORMAL HIGH (ref 150–400)
RBC: 3.76 MIL/uL — ABNORMAL LOW (ref 4.22–5.81)
RDW: 17 % — ABNORMAL HIGH (ref 11.5–15.5)
WBC: 5.6 10*3/uL (ref 4.0–10.5)
nRBC: 0 % (ref 0.0–0.2)

## 2021-06-30 LAB — PROTIME-INR
INR: 2.2 — ABNORMAL HIGH (ref 0.8–1.2)
Prothrombin Time: 24.3 seconds — ABNORMAL HIGH (ref 11.4–15.2)

## 2021-06-30 NOTE — Plan of Care (Signed)

## 2021-06-30 NOTE — Progress Notes (Signed)
PROGRESS NOTE    Joseph [name].  UXL:[id number] DOB: 11/08/1955 DOA: 06/11/2021 PCP: Donald Prose, MD   Brief Narrative: Joseph Giangrande. is a 65 y.o. 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  [place] patient presents secondary to fall at home resulting in left knee pain and found to have a left knee dislocation with associated left knee effusion with concern for possible hematoma in setting of supratherapeutic INR.  Orthopedic surgery was consulted and reduced patient's knee but recommended no surgical management secondary to patient's body habitus.  Patient required INR reversal with vitamin K and Coumadin was held secondary to concern for active bleeding.  PT/OT recommending rehab.  Coumadin restarted with heparin drip bridge.   Assessment & Plan:   Principal Problem:   Left knee dislocation, initial encounter Active Problems:   Pure hypercholesterolemia   Essential hypertension, benign   OSA (obstructive sleep apnea)   Hx pulmonary embolism   Esophageal reflux   Diabetes mellitus type 2, uncontrolled   Diabetic neuropathy (HCC)   Acute renal failure superimposed on stage 3b chronic kidney disease (HCC)   Normocytic anemia   Class 3 obesity (HCC)   Aortic atherosclerosis (HCC)   Coronary atherosclerosis   Effusion of left knee   Left knee dislocation   Pressure injury of skin   Left knee dislocation Secondary to fall. Associated fragments concerning for fracture seen on x-ray. Orthopedic surgery was consulted and reduced patient's knee with recommendations for nonweightbearing status of left leg in addition to strict use of an immobilizer and no ROM. No surgery recommended secondary to body habitus and leg wounds. PT/OT evaluating for rehab; SNF vs CIR. MRI obtained on 9/30 with evidence of  significant ligamentous injury in addition to lateral patellar dislocation. Discussed with Dr. Erlinda Hong on 9/30 who will discuss with Dr. Marlou Sa for recommendations -Oxycodone prn -Orthopedic surgery recommendations: plan for external fixation of LLE knee dislocation this week once INR less than 1.5 -Will clarify with ortho with regard to timing and need to actively reverse INR  Left knee effusion Possible left knee hematoma In setting of fall and trauma to knee while on Coumadin with supratherapeutic INR. Improving. No surgical intervention.  Right knee pain No obvious abnormalities. Right knee x-ray with small effusion, otherwise no osseous abnormalities.  Normocytic anemia In setting of CKD in addition to likely hematoma. Patient received 2 units of PRBC and 1 unit of FFP this admission. Hemoglobin stable.  AKI on CKD stage IIIb Baseline creatinine of about 1.4-1.5. Creatinine of 1.50 on admission with peak creatinine of 2.87. Possible AKI from ATN related to hypoperfusion from blood loss. Improved with IV fluids.  Hyperkalemia Resolved.  History of pulmonary embolism Patient is on Coumadin as an outpatient. Requiring bridge secondary to cessation of Coumadin and vitamin K INR reversal on admission for acute hemorrhage. Plan for surgery and INR goal less than 1.5. Coumadin discontinued 10/2 -Hold Coumadin -Heparin IV once INR less than 2; per pharmacy  Bradycardia 1st degree heart block Recurrent. Asymptomatic. In setting of known sleep apnea but occurs while awake as well. EKG significant for 1st degree AV block which does not appear to be new. Telemetry with heart rate down to high 30s while awake -Continue telemetry -Cardiology consult  CAD Asymptomatic.  Diabetic neuropathy Diabetes mellitus, type 2 Hemoglobin A1C of 6.1%. Patient is on  metformin, Actos and Lantus as an outpatient. -Continue Semglee 50 units qHS, Novolog 3 units TID, SSI, Actos  GERD -Continue  Protonix  OSA -Continue CPAP qHS  Primary hypertension Patient is on quinapril and amlodipine as an outpatient which were initially held this admission. Blood pressure uncontrolled. -Amlodipine 10 mg daily and lisinopril 40 mg daily (substituted for home quinapril while inpatient)  Hyperlipidemia Patient is on pravastatin as an outpatient which is held currently.  Constipation In setting of opiate use. -Continue Colace, Senokot, Miralax -Dulcolax suppository prn  Pressure injury Mid-nose, not present on admission. Secondary to CPAP mask  Obesity Body mass index is 63.41 kg/m.    DVT prophylaxis: Heparin IV/Coumadin Code Status:   Code Status: Full Code Family Communication: Wife at bedside Disposition Plan: Discharge to SNF when bed is available   Consultants:  Orthopedic surgery  Procedures:  LEFT KNEE REDUCTION  Antimicrobials: Keflex    Subjective: Some bleeding from left leg wound this morning that has now resolved. Had a bowel movement after suppository.  Objective: Vitals:   06/30/21 0310 06/30/21 0311 06/30/21 0436 06/30/21 0835  BP:  (!) 150/62 (!) 141/45 (!) 165/51  Pulse:  70 75 75  Resp:  18 16 19   Temp: 97.7 F (36.5 C)  97.8 F (36.6 C) 98 F (36.7 C)  TempSrc: Oral   Oral  SpO2:  97% 98% 96%  Weight:      Height:        Intake/Output Summary (Last 24 hours) at 06/30/2021 1430 Last data filed at 06/30/2021 7782 Gross per 24 hour  Intake 240 ml  Output 2100 ml  Net -1860 ml    Filed Weights   06/11/21 1326 06/25/21 0900  Weight: (!) 208.7 kg (!) 218 kg    Examination:  General exam: Appears calm and comfortable Respiratory system: Clear to auscultation. Respiratory effort normal. Cardiovascular system: S1 & S2 heard, regular rhythm with slow rate. No murmurs, rubs, gallops or clicks. Gastrointestinal system: Abdomen is obese, but otherwise seems nondistended, soft and nontender. No organomegaly or masses felt. Normal bowel sounds  heard. Central nervous system: Alert and oriented. No focal neurological deficits. Musculoskeletal:  No calf tenderness Skin: No cyanosis. BLE venous stasis changes/wounds. Large left knee wound Psychiatry: Judgement and insight appear normal. Mood & affect appropriate.    Data Reviewed: I have personally reviewed following labs and imaging studies  CBC Lab Results  Component Value Date   WBC 5.6 06/30/2021   RBC 3.76 (L) 06/30/2021   HGB 10.9 (L) 06/30/2021   HCT 35.8 (L) 06/30/2021   MCV 95.2 06/30/2021   MCH 29.0 06/30/2021   PLT 413 (H) 06/30/2021   MCHC 30.4 06/30/2021   RDW 17.0 (H) 06/30/2021   LYMPHSABS 1.7 06/17/2021   MONOABS 0.7 06/17/2021   EOSABS 0.4 06/17/2021   BASOSABS 0.1 42/35/3614     Last metabolic panel Lab Results  Component Value Date   NA 137 06/24/2021   K 4.7 06/24/2021   CL 105 06/24/2021   CO2 26 06/24/2021   BUN 37 (H) 06/24/2021   CREATININE 1.24 06/24/2021   GLUCOSE 235 (H) 06/24/2021   GFRNONAA >60 06/24/2021   GFRAA 51 (L) 04/05/2020   CALCIUM 8.5 (L) 06/24/2021   PHOS 2.8 04/05/2020   PROT 5.9 (L) 06/24/2021   ALBUMIN 2.4 (L) 06/24/2021   BILITOT 0.9 06/24/2021   ALKPHOS 89 06/24/2021   AST 16 06/24/2021   ALT 16 06/24/2021   ANIONGAP 6 06/24/2021  CBG (last 3)  Recent Labs    06/29/21 2200 06/30/21 0742 06/30/21 1240  GLUCAP 201* 120* 188*      GFR: Estimated Creatinine Clearance: 115 mL/min (by C-G formula based on SCr of 1.24 mg/dL).  Coagulation Profile: Recent Labs  Lab 06/26/21 0452 06/27/21 0512 06/28/21 0456 06/29/21 0435 06/30/21 0451  INR 2.3* 2.5* 2.3* 2.2* 2.2*     No results found for this or any previous visit (from the past 240 hour(s)).      Radiology Studies: No results found.      Scheduled Meds:  (feeding supplement) PROSource Plus  30 mL Oral BID BM   amLODipine  10 mg Oral Daily   cephALEXin  250 mg Oral QHS   docusate sodium  100 mg Oral BID   feeding supplement  (GLUCERNA SHAKE)  237 mL Oral Q24H   FLUoxetine  40 mg Oral QPM   insulin aspart  0-20 Units Subcutaneous TID WC   insulin aspart  0-5 Units Subcutaneous QHS   insulin aspart  3 Units Subcutaneous TID WC   insulin glargine-yfgn  50 Units Subcutaneous QHS   lisinopril  40 mg Oral Daily   methocarbamol  1,000 mg Oral BID   multivitamin with minerals  1 tablet Oral Daily   pantoprazole  40 mg Oral Daily   pioglitazone  45 mg Oral Daily   polyethylene glycol  17 g Oral BID   Ensure Max Protein  11 oz Oral Daily   senna  1 tablet Oral Daily   tamsulosin  0.4 mg Oral Daily   Continuous Infusions:     LOS: 18 days     Cordelia Poche, MD [facility] 06/30/2021, 2:30 PM  If 7PM-7AM, please contact night-coverage [email or web address]

## 2021-06-30 NOTE — Progress Notes (Signed)
[facility] for Warfarin Indication: h/o pulmonary embolus  Allergies  Allergen Reactions   Tape     Peels skin on legs   Chlorhexidine Rash    Wipes    Patient Measurements: Height: 6\' 1"  (185.4 cm) Weight: (!) 218 kg (480 lb 9.6 oz) IBW/kg (Calculated) : 79.9 Heparin Dosing Weight: 132.5 kg  Vital Signs: Temp: 98 F (36.7 C) (10/04 0835) Temp Source: Oral (10/04 0835) BP: 165/51 (10/04 0835) Pulse Rate: 75 (10/04 0835)  Labs: Recent Labs    06/28/21 0456 06/29/21 0435 06/30/21 0451  HGB  --  10.5* 10.9*  HCT  --  34.7* 35.8*  PLT  --  430* 413*  LABPROT 25.0* 24.6* 24.3*  INR 2.3* 2.2* 2.2*     Estimated Creatinine Clearance: 115 mL/min (by C-G formula based on SCr of 1.24 mg/dL).  Medications:  PTA warfarin dosing:  Take 5 mg in the evening on Sun, Mon, Wed, and Fri. Take 7.5 mg in the evening on Tue, Thur, and Sat [place] Must   INR was SUPRAtherapeutic (6.2) on admission  Assessment: 65 y/o M on lifelong anticoagulation with warfarin for h/o PE, body habitus, and sedentary lifestyle admitted with left knee dislocation and effusion concerning for hematoma. On admission - warfarin was held, vitamin K was given for supratherapeutic INR, and pt was transfused. Ortho following - no surgical intervention planned.   Pharmacy was consulted to resume warfarin on 9/23 with heparin bridge while INR subtherapeutic. Surgery is now planning for procedure when INR < 1.5. Plan is to d/c warfarin and bridge with either heparin or lovenox. With hematoma, will most likely bridge with heparin for ease of reversal. Will f/u INR and resume heparin infusion when INR < or = 2.   Significant Events: -Warfarin held from 9/15 - 9/22 -Vitamin K given on 9/15, 9/16, and 9/17 - Heparin stopped 9/30  Today, 06/30/21 INR = 2.2 CBC: Hgb improving; Plt elevated Continues to have bruising, but no bleeding reported  Goal of Therapy:  INR 2-3 Heparin  level 0.3-0.7 units/ml Monitor platelets by anticoagulation protocol: Yes   Plan:  Hold Warfarin Initiate Heparin IV when INR < 2 INR daily Monitor for signs of bleeding  Gretta Arab PharmD, BCPS Clinical Pharmacist [facility] main pharmacy [phone] 06/30/2021 2:54 PM

## 2021-06-30 NOTE — TOC Progression Note (Signed)
Transition of Care (TOC) - Progression Note    Patient Details  Name: Joseph [name]. MRN: [id number] Date of Birth: [birth year]  Transition of Care [facility]) CM/SW Contact  Zilphia Kozinski, Juliann Pulse, RN Phone Number: 06/30/2021, 3:03 PM  Clinical Narrative: Await post surgery for recc. Currently has bed offers for SNF. Awaiting medical stability.      Expected Discharge Plan: Skilled Nursing Facility Barriers to Discharge: Continued Medical Work up  Expected Discharge Plan and Services Expected Discharge Plan: [place]                                               Social Determinants of Health (SDOH) Interventions    Readmission Risk Interventions No flowsheet data found.

## 2021-06-30 NOTE — Progress Notes (Signed)
Inpatient Rehab Admissions Coordinator:    PT is on hold due to plans for further surgery. Pt. Is not medically ready for CIR at this time. I will follow up for potential admission once cleared to resume PT/OT.  Clemens Catholic, [place], [place] Admissions Coordinator  [phone] ([place]) [phone] (office)

## 2021-07-01 ENCOUNTER — Inpatient Hospital Stay (HOSPITAL_COMMUNITY): Payer: HMO

## 2021-07-01 ENCOUNTER — Inpatient Hospital Stay (HOSPITAL_COMMUNITY): Payer: HMO | Admitting: Anesthesiology

## 2021-07-01 ENCOUNTER — Encounter (HOSPITAL_COMMUNITY)
Admission: EM | Disposition: A | Discharge status: Hospice | Payer: Self-pay | Source: Home / Self Care | Attending: Internal Medicine

## 2021-07-01 ENCOUNTER — Encounter (HOSPITAL_COMMUNITY): Payer: Self-pay | Admitting: Internal Medicine

## 2021-07-01 DIAGNOSIS — S83105A Unspecified dislocation of left knee, initial encounter: Secondary | ICD-10-CM | POA: Diagnosis not present

## 2021-07-01 DIAGNOSIS — I493 Ventricular premature depolarization: Secondary | ICD-10-CM

## 2021-07-01 DIAGNOSIS — R001 Bradycardia, unspecified: Secondary | ICD-10-CM

## 2021-07-01 HISTORY — PX: HEMATOMA EVACUATION: SHX5118

## 2021-07-01 LAB — BASIC METABOLIC PANEL
Anion gap: 6 (ref 5–15)
BUN: 42 mg/dL — ABNORMAL HIGH (ref 8–23)
CO2: 27 mmol/L (ref 22–32)
Calcium: 8.5 mg/dL — ABNORMAL LOW (ref 8.9–10.3)
Chloride: 102 mmol/L (ref 98–111)
Creatinine, Ser: 1.17 mg/dL (ref 0.61–1.24)
GFR, Estimated: >60 mL/min (ref >60)
Glucose, Bld: 157 mg/dL — ABNORMAL HIGH (ref 70–99)
Potassium: 5.1 mmol/L (ref 3.5–5.1)
Sodium: 135 mmol/L (ref 135–145)

## 2021-07-01 LAB — CBC
HCT: 36.4 % — ABNORMAL LOW (ref 39.0–52.0)
Hemoglobin: 11 g/dL — ABNORMAL LOW (ref 13.0–17.0)
MCH: 28.6 pg (ref 26.0–34.0)
MCHC: 30.2 g/dL (ref 30.0–36.0)
MCV: 94.8 fL (ref 80.0–100.0)
Platelets: 396 10*3/uL (ref 150–400)
RBC: 3.84 MIL/uL — ABNORMAL LOW (ref 4.22–5.81)
RDW: 17.2 % — ABNORMAL HIGH (ref 11.5–15.5)
WBC: 6.5 10*3/uL (ref 4.0–10.5)
nRBC: 0 % (ref 0.0–0.2)

## 2021-07-01 LAB — GLUCOSE, CAPILLARY
Glucose-Capillary: 108 mg/dL — ABNORMAL HIGH (ref 70–99)
Glucose-Capillary: 116 mg/dL — ABNORMAL HIGH (ref 70–99)
Glucose-Capillary: 138 mg/dL — ABNORMAL HIGH (ref 70–99)
Glucose-Capillary: 149 mg/dL — ABNORMAL HIGH (ref 70–99)
Glucose-Capillary: 179 mg/dL — ABNORMAL HIGH (ref 70–99)

## 2021-07-01 LAB — SURGICAL PCR SCREEN
MRSA, PCR: NEGATIVE
Staphylococcus aureus: NEGATIVE

## 2021-07-01 LAB — PROTIME-INR
INR: 1.7 — ABNORMAL HIGH (ref 0.8–1.2)
Prothrombin Time: 20.3 seconds — ABNORMAL HIGH (ref 11.4–15.2)

## 2021-07-01 SURGERY — EVACUATION HEMATOMA
Anesthesia: General | Site: Leg Lower | Laterality: Left

## 2021-07-01 MED ORDER — LIDOCAINE 2% (20 MG/ML) 5 ML SYRINGE
INTRAMUSCULAR | Status: DC | PRN
  Administered 2021-07-01: 100 mg via INTRAVENOUS

## 2021-07-01 MED ORDER — HEPARIN (PORCINE) 25000 UT/250ML-% IV SOLN
2100.0000 [IU]/h | INTRAVENOUS | Status: DC
  Administered 2021-07-01: 2100 [IU]/h via INTRAVENOUS
  Filled 2021-07-01: qty 250

## 2021-07-01 MED ORDER — ONDANSETRON HCL 4 MG/2ML IJ SOLN
INTRAMUSCULAR | Status: DC | PRN
  Administered 2021-07-01: 4 mg via INTRAVENOUS

## 2021-07-01 MED ORDER — SODIUM CHLORIDE (PF) 0.9 % IJ SOLN
INTRAMUSCULAR | Status: AC
  Filled 2021-07-01: qty 20

## 2021-07-01 MED ORDER — LACTATED RINGERS IV BOLUS
500.0000 mL | Freq: Once | INTRAVENOUS | Status: AC
  Administered 2021-07-02: 500 mL via INTRAVENOUS

## 2021-07-01 MED ORDER — FENTANYL CITRATE (PF) 100 MCG/2ML IJ SOLN
INTRAMUSCULAR | Status: DC | PRN
  Administered 2021-07-01: 50 ug via INTRAVENOUS
  Administered 2021-07-01: 100 ug via INTRAVENOUS
  Administered 2021-07-01 (×2): 50 ug via INTRAVENOUS

## 2021-07-01 MED ORDER — DOCUSATE SODIUM 100 MG PO CAPS
100.0000 mg | ORAL_CAPSULE | Freq: Two times a day (BID) | ORAL | Status: DC
  Filled 2021-07-01: qty 1

## 2021-07-01 MED ORDER — ONDANSETRON HCL 4 MG/2ML IJ SOLN: 4.0000 mg | Freq: Four times a day (QID) | INTRAMUSCULAR | Status: DC | PRN

## 2021-07-01 MED ORDER — DEXAMETHASONE SODIUM PHOSPHATE 10 MG/ML IJ SOLN
INTRAMUSCULAR | Status: AC
  Filled 2021-07-01: qty 1

## 2021-07-01 MED ORDER — OXYCODONE HCL 5 MG PO TABS: 5.0000 mg | ORAL_TABLET | Freq: Once | ORAL | Status: DC | PRN

## 2021-07-01 MED ORDER — ACETAMINOPHEN 10 MG/ML IV SOLN
1000.0000 mg | Freq: Once | INTRAVENOUS | Status: AC
  Administered 2021-07-01: 1000 mg via INTRAVENOUS

## 2021-07-01 MED ORDER — OXYCODONE HCL 5 MG/5ML PO SOLN: 5.0000 mg | Freq: Once | ORAL | Status: DC | PRN

## 2021-07-01 MED ORDER — PHENYLEPHRINE HCL-NACL 20-0.9 MG/250ML-% IV SOLN
INTRAVENOUS | Status: DC | PRN
  Administered 2021-07-01: 25 ug/min via INTRAVENOUS
  Administered 2021-07-01: 75 ug/min via INTRAVENOUS

## 2021-07-01 MED ORDER — CEFAZOLIN IN SODIUM CHLORIDE 3-0.9 GM/100ML-% IV SOLN
3.0000 g | INTRAVENOUS | Status: AC
  Administered 2021-07-01: 3 g via INTRAVENOUS
  Filled 2021-07-01 (×2): qty 100

## 2021-07-01 MED ORDER — OXYCODONE HCL 5 MG PO TABS
10.0000 mg | ORAL_TABLET | Freq: Every day | ORAL | Status: DC | PRN
  Administered 2021-07-05 – 2021-07-16 (×3): 10 mg via ORAL
  Filled 2021-07-01 (×10): qty 2

## 2021-07-01 MED ORDER — HYDROMORPHONE HCL 1 MG/ML IJ SOLN
0.2500 mg | INTRAMUSCULAR | Status: DC | PRN
  Administered 2021-07-01 (×3): 0.5 mg via INTRAVENOUS

## 2021-07-01 MED ORDER — FENTANYL CITRATE (PF) 250 MCG/5ML IJ SOLN
INTRAMUSCULAR | Status: AC
  Filled 2021-07-01: qty 5

## 2021-07-01 MED ORDER — MORPHINE SULFATE (PF) 4 MG/ML IV SOLN
INTRAVENOUS | Status: AC
  Filled 2021-07-01: qty 1

## 2021-07-01 MED ORDER — ONDANSETRON HCL 4 MG/2ML IJ SOLN
INTRAMUSCULAR | Status: AC
  Filled 2021-07-01: qty 2

## 2021-07-01 MED ORDER — PROPOFOL 10 MG/ML IV BOLUS
INTRAVENOUS | Status: AC
  Filled 2021-07-01: qty 20

## 2021-07-01 MED ORDER — CHLORHEXIDINE GLUCONATE 4 % EX LIQD: 60.0000 mL | Freq: Once | CUTANEOUS | Status: DC

## 2021-07-01 MED ORDER — POVIDONE-IODINE 10 % EX SWAB
2.0000 "application " | Freq: Once | CUTANEOUS | Status: AC
  Administered 2021-07-01: 2 via TOPICAL

## 2021-07-01 MED ORDER — METHOCARBAMOL 500 MG IVPB - SIMPLE MED
INTRAVENOUS | Status: AC
  Filled 2021-07-01: qty 50

## 2021-07-01 MED ORDER — HYDROMORPHONE HCL 1 MG/ML IJ SOLN
0.5000 mg | INTRAMUSCULAR | Status: DC | PRN
  Administered 2021-07-01 – 2021-07-02 (×2): 0.5 mg via INTRAVENOUS
  Filled 2021-07-01 (×2): qty 0.5

## 2021-07-01 MED ORDER — BACITRACIN ZINC 500 UNIT/GM EX OINT
TOPICAL_OINTMENT | CUTANEOUS | Status: AC
  Filled 2021-07-01: qty 28.35

## 2021-07-01 MED ORDER — PROMETHAZINE HCL 25 MG/ML IJ SOLN: 6.2500 mg | INTRAMUSCULAR | Status: DC | PRN

## 2021-07-01 MED ORDER — LACTATED RINGERS IV SOLN: INTRAVENOUS | Status: DC

## 2021-07-01 MED ORDER — METOCLOPRAMIDE HCL 5 MG/ML IJ SOLN: 5.0000 mg | Freq: Three times a day (TID) | INTRAMUSCULAR | Status: DC | PRN

## 2021-07-01 MED ORDER — METHOCARBAMOL 500 MG IVPB - SIMPLE MED
500.0000 mg | Freq: Four times a day (QID) | INTRAVENOUS | Status: DC | PRN
Start: 2021-07-01 — End: 2021-07-08
  Administered 2021-07-01: 500 mg via INTRAVENOUS
  Filled 2021-07-01: qty 50

## 2021-07-01 MED ORDER — PROPOFOL 1000 MG/100ML IV EMUL
INTRAVENOUS | Status: AC
  Filled 2021-07-01: qty 100

## 2021-07-01 MED ORDER — ONDANSETRON HCL 4 MG PO TABS
4.0000 mg | ORAL_TABLET | Freq: Four times a day (QID) | ORAL | Status: DC | PRN
  Filled 2021-07-01: qty 1

## 2021-07-01 MED ORDER — METHOCARBAMOL 500 MG PO TABS
500.0000 mg | ORAL_TABLET | Freq: Four times a day (QID) | ORAL | Status: DC | PRN
  Administered 2021-07-02: 500 mg via ORAL
  Filled 2021-07-01: qty 1

## 2021-07-01 MED ORDER — PROPOFOL 10 MG/ML IV BOLUS
INTRAVENOUS | Status: DC | PRN
Start: 2021-07-01 — End: 2021-07-01
  Administered 2021-07-01: 270 mg via INTRAVENOUS

## 2021-07-01 MED ORDER — AMISULPRIDE (ANTIEMETIC) 5 MG/2ML IV SOLN: 10.0000 mg | Freq: Once | INTRAVENOUS | Status: DC | PRN

## 2021-07-01 MED ORDER — SODIUM CHLORIDE 0.9 % IR SOLN
Status: DC | PRN
  Administered 2021-07-01: 6000 mL

## 2021-07-01 MED ORDER — ACETAMINOPHEN 10 MG/ML IV SOLN
INTRAVENOUS | Status: AC
  Filled 2021-07-01: qty 100

## 2021-07-01 MED ORDER — OXYCODONE HCL 5 MG PO TABS
10.0000 mg | ORAL_TABLET | ORAL | Status: DC | PRN
  Administered 2021-07-01 – 2021-07-17 (×49): 10 mg via ORAL
  Filled 2021-07-01 (×44): qty 2

## 2021-07-01 MED ORDER — MIDAZOLAM HCL 5 MG/5ML IJ SOLN
INTRAMUSCULAR | Status: DC | PRN
  Administered 2021-07-01 (×2): 1 mg via INTRAVENOUS

## 2021-07-01 MED ORDER — BACITRACIN ZINC 500 UNIT/GM EX OINT
TOPICAL_OINTMENT | CUTANEOUS | Status: DC | PRN
  Administered 2021-07-01: 1 via TOPICAL

## 2021-07-01 MED ORDER — MORPHINE SULFATE (PF) 2 MG/ML IV SOLN
2.0000 mg | INTRAVENOUS | Status: DC | PRN
  Administered 2021-07-01: 2 mg via INTRAVENOUS
  Filled 2021-07-01: qty 1

## 2021-07-01 MED ORDER — PHENYLEPHRINE HCL (PRESSORS) 10 MG/ML IV SOLN
INTRAVENOUS | Status: AC
  Filled 2021-07-01: qty 2

## 2021-07-01 MED ORDER — MEPERIDINE HCL 50 MG/ML IJ SOLN: 6.2500 mg | INTRAMUSCULAR | Status: DC | PRN

## 2021-07-01 MED ORDER — PROPOFOL 500 MG/50ML IV EMUL
INTRAVENOUS | Status: DC | PRN
Start: 2021-07-01 — End: 2021-07-01
  Administered 2021-07-01: 25 ug/kg/min via INTRAVENOUS

## 2021-07-01 MED ORDER — ACETAMINOPHEN 500 MG PO TABS
1000.0000 mg | ORAL_TABLET | Freq: Four times a day (QID) | ORAL | Status: AC
  Administered 2021-07-02 (×3): 1000 mg via ORAL
  Filled 2021-07-01 (×4): qty 2

## 2021-07-01 MED ORDER — HEPARIN (PORCINE) 25000 UT/250ML-% IV SOLN
2100.0000 [IU]/h | INTRAVENOUS | Status: DC
  Administered 2021-07-02: 2100 [IU]/h via INTRAVENOUS
  Filled 2021-07-01: qty 250

## 2021-07-01 MED ORDER — MIDAZOLAM HCL 2 MG/2ML IJ SOLN
INTRAMUSCULAR | Status: AC
  Filled 2021-07-01: qty 2

## 2021-07-01 MED ORDER — CEFAZOLIN SODIUM-DEXTROSE 2-4 GM/100ML-% IV SOLN
2.0000 g | Freq: Three times a day (TID) | INTRAVENOUS | Status: AC
  Administered 2021-07-02 – 2021-07-05 (×12): 2 g via INTRAVENOUS
  Filled 2021-07-01 (×13): qty 100

## 2021-07-01 MED ORDER — HYDROMORPHONE HCL 1 MG/ML IJ SOLN
INTRAMUSCULAR | Status: AC
  Administered 2021-07-01: 0.5 mg via INTRAVENOUS
  Filled 2021-07-01: qty 2

## 2021-07-01 MED ORDER — CEFAZOLIN IN SODIUM CHLORIDE 3-0.9 GM/100ML-% IV SOLN: 3.0000 g | INTRAVENOUS | Status: DC

## 2021-07-01 MED ORDER — METOCLOPRAMIDE HCL 5 MG PO TABS
5.0000 mg | ORAL_TABLET | Freq: Three times a day (TID) | ORAL | Status: DC | PRN
  Filled 2021-07-01: qty 2

## 2021-07-01 SURGICAL SUPPLY — 61 items
BAG COUNTER SPONGE SURGICOUNT (BAG) IMPLANT
BAG ZIPLOCK 12X15 (MISCELLANEOUS) ×4 IMPLANT
BANDAGE ESMARK 6X9 LF (GAUZE/BANDAGES/DRESSINGS) ×1 IMPLANT
BAR GLASS FIBER EXFX 11X500 (EXFIX) ×2 IMPLANT
BNDG COHESIVE 6X5 TAN ST LF (GAUZE/BANDAGES/DRESSINGS) ×2 IMPLANT
BNDG ELASTIC 4X5.8 VLCR STR LF (GAUZE/BANDAGES/DRESSINGS) ×2 IMPLANT
BNDG ELASTIC 6X15 VLCR STRL LF (GAUZE/BANDAGES/DRESSINGS) ×1 IMPLANT
BNDG ELASTIC 6X5.8 VLCR STR LF (GAUZE/BANDAGES/DRESSINGS) ×2 IMPLANT
BNDG ESMARK 6X9 LF (GAUZE/BANDAGES/DRESSINGS) ×2
BNDG GAUZE ELAST 4 BULKY (GAUZE/BANDAGES/DRESSINGS) ×2 IMPLANT
CLEANER TIP ELECTROSURG 2X2 (MISCELLANEOUS) ×2 IMPLANT
CUFF TOURN SGL QUICK 34 (TOURNIQUET CUFF) ×2
CUFF TRNQT CYL 34X4.125X (TOURNIQUET CUFF) ×1 IMPLANT
DERMABOND ADVANCED (GAUZE/BANDAGES/DRESSINGS) ×3
DERMABOND ADVANCED .7 DNX12 (GAUZE/BANDAGES/DRESSINGS) IMPLANT
DRAPE C-ARM 42X120 X-RAY (DRAPES) ×2 IMPLANT
DRAPE DERMATAC (DRAPES) ×2 IMPLANT
DRAPE U-SHAPE 47X51 STRL (DRAPES) ×2 IMPLANT
DRESSING VERAFLO CLEANSE CC (GAUZE/BANDAGES/DRESSINGS) IMPLANT
DRSG ADAPTIC 3X8 NADH LF (GAUZE/BANDAGES/DRESSINGS) ×2 IMPLANT
DRSG VERAFLO CLEANSE CC (GAUZE/BANDAGES/DRESSINGS) ×2
DRSG VERAFLO VAC MED (GAUZE/BANDAGES/DRESSINGS) ×1 IMPLANT
ELECT REM PT RETURN 15FT ADLT (MISCELLANEOUS) ×2 IMPLANT
EVACUATOR 1/8 PVC DRAIN (DRAIN) ×1 IMPLANT
EVACUATOR SILICONE 100CC (DRAIN) ×1 IMPLANT
GAUZE SPONGE 4X4 12PLY STRL (GAUZE/BANDAGES/DRESSINGS) ×2 IMPLANT
GAUZE XEROFORM 1X8 LF (GAUZE/BANDAGES/DRESSINGS) ×1 IMPLANT
GLOVE SURG ENC MOIS LTX SZ6.5 (GLOVE) ×1 IMPLANT
GLOVE SURG ORTHO LTX SZ8 (GLOVE) ×2 IMPLANT
GLOVE SURG POLY ORTHO LF SZ7.5 (GLOVE) ×1 IMPLANT
GLOVE SURG UNDER POLY LF SZ6.5 (GLOVE) ×1 IMPLANT
GOWN STRL REUS W/TWL LRG LVL3 (GOWN DISPOSABLE) ×3 IMPLANT
KIT BASIN OR (CUSTOM PROCEDURE TRAY) ×1 IMPLANT
KIT TURNOVER KIT A (KITS) ×2 IMPLANT
NEEDLE HYPO 22GX1.5 SAFETY (NEEDLE) ×2 IMPLANT
NS IRRIG 1000ML POUR BTL (IV SOLUTION) ×2 IMPLANT
PACK ORTHO EXTREMITY (CUSTOM PROCEDURE TRAY) ×2 IMPLANT
PACK TOTAL JOINT (CUSTOM PROCEDURE TRAY) ×1 IMPLANT
PACK TOTAL KNEE CUSTOM (KITS) ×1 IMPLANT
PADDING CAST COTTON 6X4 STRL (CAST SUPPLIES) ×2 IMPLANT
PENCIL SMOKE EVACUATOR (MISCELLANEOUS) ×1 IMPLANT
PIN CLAMP 2BAR 75MM BLUE (EXFIX) ×2 IMPLANT
PIN HALF ORANGE 5X200X45MM (EXFIX) ×2 IMPLANT
PIN HALF YELLOW 5X160X35 (EXFIX) ×2 IMPLANT
PROTECTOR NERVE ULNAR (MISCELLANEOUS) ×2 IMPLANT
SET INTERPULSE LAVAGE W/TIP (ORTHOPEDIC DISPOSABLE SUPPLIES) ×1 IMPLANT
SOL PREP POV-IOD 4OZ 10% (MISCELLANEOUS) ×1 IMPLANT
SOL PREP PROV IODINE SCRUB 4OZ (MISCELLANEOUS) ×3 IMPLANT
SPONGE T-LAP 18X18 ~~LOC~~+RFID (SPONGE) ×5 IMPLANT
STAPLER VISISTAT 35W (STAPLE) ×2 IMPLANT
STOCKINETTE 8 INCH (MISCELLANEOUS) ×1 IMPLANT
STRIP CLOSURE SKIN 1/2X4 (GAUZE/BANDAGES/DRESSINGS) ×2 IMPLANT
SUCTION FRAZIER HANDLE 10FR (MISCELLANEOUS)
SUCTION TUBE FRAZIER 10FR DISP (MISCELLANEOUS) ×1 IMPLANT
SUT ETHILON 3 0 PS 1 (SUTURE) ×2 IMPLANT
SUT VIC AB 2-0 CT1 27 (SUTURE) ×6
SUT VIC AB 2-0 CT1 27XBRD (SUTURE) IMPLANT
SUT VIC AB 2-0 CT1 TAPERPNT 27 (SUTURE) ×2 IMPLANT
SYR CONTROL 10ML LL (SYRINGE) ×2 IMPLANT
TOWEL OR 17X26 10 PK STRL BLUE (TOWEL DISPOSABLE) ×5 IMPLANT
WATER STERILE IRR 1000ML POUR (IV SOLUTION) ×2 IMPLANT

## 2021-07-01 NOTE — Anesthesia Preprocedure Evaluation (Signed)
Anesthesia Evaluation  Patient identified by MRN, date of birth, ID band Patient awake    Reviewed: Allergy & Precautions, H&P , NPO status , Patient's Chart, lab work & pertinent test results  Airway Mallampati: II  TM Distance: >3 FB Neck ROM: full    Dental no notable dental hx.    Pulmonary shortness of breath, sleep apnea ,    Pulmonary exam normal breath sounds clear to auscultation       Cardiovascular hypertension, + Peripheral Vascular Disease  Normal cardiovascular exam Rhythm:regular Rate:Normal     Neuro/Psych PSYCHIATRIC DISORDERS Anxiety Depression  Neuromuscular disease    GI/Hepatic GERD  ,  Endo/Other  diabetes, Type 2Morbid obesity  Renal/GU Renal InsufficiencyRenal disease     Musculoskeletal   Abdominal (+) + obese,   Peds  Hematology   Anesthesia Other Findings   Reproductive/Obstetrics                             Anesthesia Physical  Anesthesia Plan  ASA: 4 and emergent  Anesthesia Plan: General   Post-op Pain Management:    Induction: Intravenous  PONV Risk Score and Plan: 2 and Treatment may vary due to age or medical condition, Ondansetron and Midazolam  Airway Management Planned: LMA  Additional Equipment:   Intra-op Plan:   Post-operative Plan: Extubation in OR  Informed Consent: I have reviewed the patients History and Physical, chart, labs and discussed the procedure including the risks, benefits and alternatives for the proposed anesthesia with the patient or authorized representative who has indicated his/her understanding and acceptance.       Plan Discussed with: CRNA, Anesthesiologist and Surgeon  Anesthesia Plan Comments:         Anesthesia Quick Evaluation

## 2021-07-01 NOTE — Progress Notes (Signed)
    OVERNIGHT PROGRESS REPORT  Notified by RN for patient/family concern over area surrounding injury area. Please see Dr Randel Pigg note on extensive injury description.  Visited patient and reviewed notation and gathered story from patient and family member. Examined area and extremity for bleeding, deformity, etc.  There is a reddened area medially located to the open/healing wound area. The area has been covered with Xeroform dressings and abd pads with immobilization in place. Which is replaced after exam.  Patient has movement of the foot. Foot/ankle strength is present in all directions.  A pulse is palpable. Capillary refill is attainable and less than 3 seconds and corresponds to the same as the unaffected limb.  There is no pain reproducible anywhere other than the immediate open/healing area. The upper leg is soft to areas other than the immediately affected area and there is sensation equal to the other extremity.  The Hospitalist was called to see the patient also and contributed recommendation.  Dr Durward Fortes (Ortho) was called for recommendations and provided the assessment findings. Dr Durward Fortes has messaged Dr Marlou Sa (ortho) for notification of need to see patient this AM as he has seen the patient prior. No further was recommended at this time  Ortho will see in AM    Nursing is instructed to reassess the extremity at intervals and record findings.           Gershon Cull MSNA MSN ACNPC-AG Acute Care Nurse Practitioner [place]

## 2021-07-01 NOTE — Progress Notes (Signed)
OT Cancellation Note  Patient Details Name: Joseph [name]. MRN: [id number] DOB: [birth year]   Cancelled Treatment:    Reason Eval/Treat Not Completed: Medical issues which prohibited therapy. Medical issues which prohibited therapy, Orthopedic note reviewed. Given patient will require further reduction of L knee joint dislocation and external fixator. Will hold therapy due to significant risks for vascular compromise until L knee is more stable per ortho.   Rajveer Handler L Ashanti Ratti 07/01/2021, 7:42 AM

## 2021-07-01 NOTE — Consult Note (Addendum)
Cardiology Consultation:   Patient ID: Joseph [name]. MRN: [id number]; DOB: 07-21-1956  Admit date: 06/11/2021 Date of Consult: 07/01/2021  PCP:  Joseph Prose, MD   [facility] Providers Cardiologist:  New - remotely seen by Dr. Wynonia [name]       Patient Profile:   Joseph [name]. is a 65 y.o. 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 Lonny [name].  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 He lost his L toe due to lack of pain sensation and it became infected.  He was seen remotely by Dr. Wynonia [name] for shortness of breath.  VQ scan obtained on 01/02/2003 revealed multiple segmental wedge-shaped VQ mismatch is bilaterally compatible with PE.  Echocardiogram obtained on 01/03/2003 showed EF 55 to 65%, mildly dilated RV, mildly dilated left atrium, otherwise no significant wall motion abnormality or valve disease.  At the time of DVT and PE diagnosis, he was living in [place] and commute daily to [place] to work.  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  Per patient, Dr. Wynonia [name] recommended lifelong anticoagulation therapy as he is deemed high probability for recurrence.  His father is currently 25 years old and has been diagnosed with multiple PEs in the past 2 years and is currently taking Eliquis.  Otherwise, patient denies any other cardiac issue.  He never had a heart attack or stroke.  He has not seen a cardiologist in many years.  Patient was in his usual state of health until he fell at home resulting in significant left knee pain.  He was diagnosed  with left knee dislocation.  INR was supratherapeutic.  He was later found to have left knee effusion concerning for hematoma in the setting of hot elevated INR.  Orthopedic surgery was consulted with plan for surgery later today once his INR gets below 1.5.  According to primary team, patient has been having bradycardia in the past few days.  He uses his CPAP via nasal mask.  Talking with the patient, he denies any recent chest pain, worsening dyspnea, significant fatigue, dizziness, blurred vision or feeling of passing out.  Looking to telemetry, his average heart rate has been in the 60s, although there a few documented bradycardia in the past 24 hours, they were the result of telemetry failing to capture the QRS and mistakenly thinking it is missed beat.  However, family medicine service he likely had bradycardia prior to last night, although I am unable to review the earlier record on the telemetry.   Past Medical History:  Diagnosis Date   Anxiety state, unspecified    Aortic atherosclerosis ([place]) 06/12/2021   Depression    DVT (deep venous thrombosis) ([place]) 2004   Left knee   Edema    Esophageal reflux    occ   Essential hypertension, benign    Essential hypertension, benign    Falls frequently    History of iron deficiency anemia    Morbid obesity (HCC)    Myalgia and myositis, unspecified    BACK  AND LEGS   OSA on CPAP    Peripheral vascular disease, unspecified ([place])    Personal history of PE (pulmonary embolism) 2004   Polyneuropathy in diabetes(357.2)    Poor venous access    PT STATES DIFFICULT TO DRAW BLOOD FROM HIS VEINS   Pure hypercholesterolemia    Shortness of breath    DUE TO WEIGHT AND LOSS OF MOBILITY   Type II or unspecified type diabetes mellitus with neurological manifestations, not stated as uncontrolled(250.60)    Type II or unspecified type diabetes mellitus without mention of complication, not stated as uncontrolled    Type II or unspecified type diabetes  mellitus without mention of complication, uncontrolled    Varicose veins of lower extremities with inflammation    Wears glasses    Wound infection after surgery    required hospitalization    Past Surgical History:  Procedure Laterality Date   AMPUTATION TOE Bilateral 04/03/2020   Procedure: BILATERAL HALLUX AMPUTATION;  Surgeon: Felipa Furnace, DPM;  Location: [facility];  Service: Podiatry;  Laterality: Bilateral;   COLONOSCOPY WITH PROPOFOL N/A 07/20/2019   Procedure: COLONOSCOPY WITH PROPOFOL;  Surgeon: Wonda Horner, MD;  Location: [facility];  Service: Endoscopy;  Laterality: N/A;   I & D EXTREMITY Bilateral 04/05/2020   Procedure: IRRIGATION AND DEBRIDEMENT EXTREMITY WITH CLOSURE - BILATERALLY;  Surgeon: Felipa Furnace, DPM;  Location: [facility];  Service: Podiatry;  Laterality: Bilateral;   NO PAST SURGERIES     PANNICULECTOMY N/A 07/15/2014   Procedure: PANNICULECTOMY;  Surgeon: Pedro Earls, MD;  Location: [facility];  Service: General;  Laterality: N/A;   POLYPECTOMY  07/20/2019   Procedure: POLYPECTOMY;  Surgeon: Wonda Horner, MD;  Location: [facility];  Service: Endoscopy;;     Home Medications:  Prior to Admission medications   Medication Sig Start Date End Date Taking? Authorizing Provider  acetaminophen (TYLENOL) 500 MG tablet Take 1,000 mg by mouth daily as needed for moderate pain or headache.    Yes [provider]  amLODipine (NORVASC) 10 MG tablet Take 10 mg by mouth every evening.    Yes [provider]  cephALEXin (KEFLEX) 250 MG capsule Take 250 mg by mouth at bedtime. 06/05/21  Yes [provider]  FLUoxetine (PROZAC) 40 MG capsule Take 40 mg by mouth every evening.    Yes [provider]  furosemide (LASIX) 40 MG tablet Take 40 mg by mouth daily.    Yes [provider]  indapamide (LOZOL) 2.5 MG tablet Take 5 mg by mouth daily.   Yes [provider]  insulin aspart (NOVOLOG) 100 UNIT/ML injection Inject 0-25  Units into the skin as needed for high blood sugar. Sliding Scale 06/08/21  Yes [provider]  LANTUS 100 UNIT/ML injection Inject 50 Units into the skin at bedtime. 06/08/21  Yes [provider]  metFORMIN (GLUCOPHAGE) 1000 MG tablet Take 1,000 mg by mouth 2 (two) times daily with a meal.   Yes [provider]  omeprazole (PRILOSEC) 40 MG capsule Take 40 mg by mouth daily as needed (acid reflux).    Yes [provider]  pioglitazone (ACTOS) 45 MG tablet Take 45 mg by mouth daily.   Yes [provider]  pravastatin (PRAVACHOL) 40 MG tablet Take 40 mg by mouth every evening.    Yes [provider]  Prenatal Vit-Fe Fumarate-FA (PRENATAL FA PO) Take 1 tablet by mouth daily.   Yes [provider]  quinapril (ACCUPRIL) 40  MG tablet Take 40 mg by mouth daily.    Yes [provider]  silver sulfADIAZINE (SILVADENE) 1 % cream Apply 1 application topically daily. Patient taking differently: Apply 1 application topically daily as needed (wounds on toes and legs). 02/26/20  Yes Early, Arvilla Meres, MD  tamsulosin (FLOMAX) 0.4 MG CAPS capsule Take 0.4 mg by mouth daily. 04/02/21  Yes [provider]  warfarin (COUMADIN) 5 MG tablet Take 5-7.5 mg by mouth See admin instructions. Take 5 mg in the evening on Sun, Mon, Wed, and Fri. Take 7.5 mg in the evening on Tue, Thur, and Sat   Yes [provider]  Wheat Dextrin (BENEFIBER) POWD Take 15 mLs by mouth daily as needed (constipation). Mix in coffee and drink   Yes [provider]  acetaminophen (TYLENOL) 325 MG tablet Take 2 tablets (650 mg total) by mouth every 6 (six) hours as needed. Patient not taking: Reported on 06/11/2021 06/08/21   Wynona Dove A, DO  Continuous Blood Gluc Sensor (FREESTYLE LIBRE 2 SENSOR) MISC APPLY EVERY 14 DAYS AS DIRECTED 03/28/20   [provider]  FREESTYLE LITE test strip TEST BLOOD SUGAR FOUR TIMES DAILY 02/11/20   [provider]     Inpatient Medications: Scheduled Meds:  (feeding supplement) PROSource Plus  30 mL Oral BID BM   amLODipine  10 mg Oral Daily   cephALEXin  250 mg Oral QHS   docusate sodium  100 mg Oral BID   feeding supplement (GLUCERNA SHAKE)  237 mL Oral Q24H   FLUoxetine  40 mg Oral QPM   insulin aspart  0-20 Units Subcutaneous TID WC   insulin aspart  0-5 Units Subcutaneous QHS   insulin aspart  3 Units Subcutaneous TID WC   insulin glargine-yfgn  50 Units Subcutaneous QHS   lisinopril  40 mg Oral Daily   methocarbamol  1,000 mg Oral BID   multivitamin with minerals  1 tablet Oral Daily   pantoprazole  40 mg Oral Daily   pioglitazone  45 mg Oral Daily   polyethylene glycol  17 g Oral BID   Ensure Max Protein  11 oz Oral Daily   senna  1 tablet Oral Daily   tamsulosin  0.4 mg Oral Daily   Continuous Infusions:  heparin     PRN Meds: acetaminophen **OR** acetaminophen, bisacodyl, ondansetron **OR** ondansetron (ZOFRAN) IV, oxyCODONE **AND** oxyCODONE, silver sulfADIAZINE  Allergies:    Allergies  Allergen Reactions   Tape     Peels skin on legs   Chlorhexidine Rash    Wipes    Social History:   Social History   Socioeconomic History   Marital status: Married    Spouse name: Not on file   Number of children: Not on file   Years of education: Not on file   Highest education level: Not on file  Occupational History   Not on file  Tobacco Use   Smoking status: Never   Smokeless tobacco: Never  Vaping Use   Vaping Use: Never used  Substance and Sexual Activity   Alcohol use: Not Currently   Drug use: No   Sexual activity: Not on file  Other Topics Concern   Not on file  Social History Narrative   Not on file   Social Determinants of Health   Financial Resource Strain: Not on file  Food Insecurity: Not on file  Transportation Needs: Not on file  Physical Activity: Not on file  Stress: Not on file  Social  Connections: Not on file  Intimate Partner Violence: Not  on file    Family History:    Family History  Problem Relation Age of Onset   Hypertension Father    Pulmonary embolism Father    Hypertension Other      ROS:  Please see the history of present illness.   All other ROS reviewed and negative.     Physical Exam/Data:   Vitals:   06/30/21 1521 06/30/21 1927 06/30/21 2144 07/01/21 0553  BP: (!) 186/42  (!) 157/43 (!) 154/43  Pulse: 77 78 83 71  Resp: 14 (!) 23  18  Temp: 98.5 F (36.9 C)  98.9 F (37.2 C) 97.9 F (36.6 C)  TempSrc: Oral  Oral Oral  SpO2: 98% 93% 95%   Weight:      Height:        Intake/Output Summary (Last 24 hours) at 07/01/2021 0905 Last data filed at 07/01/2021 0500 Gross per 24 hour  Intake --  Output 1600 ml  Net -1600 ml   Last 3 Weights 06/25/2021 06/11/2021 04/03/2020  Weight (lbs) 480 lb 9.6 oz 460 lb 470 lb  Weight (kg) 218 kg 208.655 kg 213.191 kg     Body mass index is 63.41 kg/m.  General:  Well nourished, well developed, in no acute distress HEENT: normal Neck: no JVD Vascular: No carotid bruits; Distal pulses 2+ bilaterally Cardiac:  normal S1, S2; RRR; no murmur  Lungs:  clear to auscultation bilaterally, no wheezing, rhonchi or rales  Abd: soft, nontender, no hepatomegaly  Ext: no edema Musculoskeletal:  No deformities, BUE and BLE strength normal and equal Skin: warm and dry  Neuro:  CNs 2-12 intact, no focal abnormalities noted Psych:  Normal affect   EKG:  The EKG was personally reviewed and demonstrates: Normal sinus rhythm, first-degree AV block, no significant ST-T wave changes Telemetry:  Telemetry was personally reviewed and demonstrates: Normal sinus rhythm, heart rate 60s overnight.  No significant true bradycardia.  Few bradycardia and asystole recorded was due to telemetry not capturing the QRS, mistakenly believe it is missed beat.  Relevant CV Studies:  Echo 01/03/2003 SUMMARY   -  Overall left ventricular systolic function was normal. Left         ventricular  ejection fraction was estimated , range being 55         % to 65 %. There was no diagnostic evidence of left         ventricular regional wall motion abnormalities.   -  The left atrium was mildly dilated.   -  The right ventricle was mildly dilated.  Laboratory Data:  High Sensitivity Troponin:  No results for input(s): TROPONINIHS in the last 720 hours.   Chemistry Recent Labs  Lab 06/24/21 1027 07/01/21 0446  NA  --  135  K 4.7 5.1  CL  --  102  CO2  --  27  GLUCOSE  --  157*  BUN  --  42*  CREATININE  --  1.17  CALCIUM  --  8.5*  GFRNONAA  --  >60  ANIONGAP  --  6    No results for input(s): PROT, ALBUMIN, AST, ALT, ALKPHOS, BILITOT in the last 168 hours. Lipids No results for input(s): CHOL, TRIG, HDL, LABVLDL, LDLCALC, CHOLHDL in the last 168 hours.  Hematology Recent Labs  Lab 06/29/21 0435 06/30/21 0451 07/01/21 0446  WBC 5.7 5.6 6.5  RBC 3.67* 3.76* 3.84*  HGB 10.5* 10.9* 11.0*  HCT  34.7* 35.8* 36.4*  MCV 94.6 95.2 94.8  MCH 28.6 29.0 28.6  MCHC 30.3 30.4 30.2  RDW 16.9* 17.0* 17.2*  PLT 430* 413* 396   Thyroid No results for input(s): TSH, FREET4 in the last 168 hours.  BNPNo results for input(s): BNP, PROBNP in the last 168 hours.  DDimer No results for input(s): DDIMER in the last 168 hours.   Radiology/Studies:  No results found.   Assessment and Plan:   Possible bradycardia with first-degree AV block:  -No sign of bradycardia in the past 24 hours based on telemetry.  A few bradycardic episode was caused by telemetry failing to capture the QRS, mistakenly thinking it is a missed beat.  Unclear if he had bradycardia prior to that.   -Talking with the patient, he is completely asymptomatic and the denies any recent dizziness, blurred vision or feeling of passing out. -He is not on any AV nodal blocking agent.  First-degree heart block is considered benign.  He had borderline first-degree AV block on EKG in July 2021 -We will continue to monitor his  telemetry for the next few days, if no further significant bradycardia, then no intervention is needed.  Left knee injury: Pending surgery tonight by Dr. Marlou Sa.  Patient does not have a history of CAD or stroke.  Hypertension: On quinapril and amlodipine at home.  Quinapril switched to lisinopril in the hospital based on the hospital formulary conversion.  Hyperlipidemia: On pravastatin  DM2 with significant neuropathy  History of left lower extremity DVT/PE in April 2004 on chronic Coumadin therapy  Super morbid obesity    For questions or updates, please contact [facility] Please consult [email or web address] for contact info under    Signed, Almyra Deforest, [place]  07/01/2021 9:05 AM  Pt seen and examined   Agree with findings of H Meng as noted above   Pt is a 65 yo with morbid obesity, Hx LE DVT and PE, HTN, HL, DM, OSA and PAD    Asked to see re bradcyardia    On review of tele pt hs no significant bradycardia, no pauses. It does show frequent PVCs The pt denies dizziness,     On exam,  Morbidly obese   Wearing CPAP Neck is full    Lungs aer CTA     Cardiac RRR  No S3   No murmrs  Ext with Tr edema    Tele shows SB, SR   Frequent PVCs   No pauses of significant bradycardia   Note motion artifact  Recomm:   1   Bradycardia  I do not see a concerning spells on tele  There eis artifact that may have been ininterpretted    Follow on tele  2  PVCs   Frequent on tele  Cant quantify      Would set up for 48 hour Zio patch afger D/C  3  Hx DVT/PE   I would recomm echo to eval LV and RV systolic function , esp with PVCs Keep on coumadin    4  Hx atherosclerosis   Seen on CT exam      5  HL  On pravastatin  6  OSA  Uses CPAP    6  DM

## 2021-07-01 NOTE — Transfer of Care (Signed)
Immediate Anesthesia Transfer of Care Note  Patient: Joseph [name].  Procedure(s) Performed: LEFT KNEE REDUCTION EXTERNAL FIXATION WITH EVACUATION HEMATOMA (Left: Leg Lower)  Patient Location: PACU  Anesthesia Type:General  Level of Consciousness: awake and alert   Airway & Oxygen Therapy: Patient Spontanous Breathing and Patient connected to face mask oxygen  Post-op Assessment: Report given to RN and Post -op Vital signs reviewed and stable  Post vital signs: Reviewed and stable  Last Vitals:  Vitals Value Taken Time  BP    Temp    Pulse 66 07/01/21 2115  Resp    SpO2 99 % 07/01/21 2115  Vitals shown include unvalidated device data.  Last Pain:  Vitals:   07/01/21 1518  TempSrc:   PainSc: 0-No pain      Patients Stated Pain Goal: 2 (01/08/63 3837)  Complications: No notable events documented.

## 2021-07-01 NOTE — Progress Notes (Signed)
Inpatient Rehab Admissions Coordinator:   Therapy is currently on hold due to need for further reduction of L knee joint dislocation and risk for vascular compromise while knee is unstable. Not appropriate for CIR at this time.   Clemens Catholic, [place], [place] Admissions Coordinator  [phone] ([place]) [phone] (office)

## 2021-07-01 NOTE — Progress Notes (Signed)
PROGRESS NOTE    Joseph [name].  ONG:[id number] DOB: [birth year] DOA: 06/11/2021 PCP: Donald Prose, MD   Brief Narrative: Joseph Stolz. is a 65 y.o. 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  [place] patient presents secondary to fall at home resulting in left knee pain and found to have a left knee dislocation with associated left knee effusion with concern for possible hematoma in setting of supratherapeutic INR.  Orthopedic surgery was consulted and reduced patient's knee but recommended no surgical management secondary to patient's body habitus.  Patient required INR reversal with vitamin K and Coumadin was held secondary to concern for active bleeding.  PT/OT recommending rehab.  Coumadin restarted with heparin drip bridge.   Assessment & Plan:   Principal Problem:   Left knee dislocation, initial encounter Active Problems:   Pure hypercholesterolemia   Essential hypertension, benign   OSA (obstructive sleep apnea)   Hx pulmonary embolism   Esophageal reflux   Diabetes mellitus type 2, uncontrolled   Diabetic neuropathy (HCC)   Acute renal failure superimposed on stage 3b chronic kidney disease (HCC)   Normocytic anemia   Class 3 obesity (HCC)   Aortic atherosclerosis (HCC)   Coronary atherosclerosis   Effusion of left knee   Left knee dislocation   Pressure injury of skin   Left knee dislocation Secondary to fall. Associated fragments concerning for fracture seen on x-ray. Orthopedic surgery was consulted and reduced patient's knee with recommendations for nonweightbearing status of left leg in addition to strict use of an immobilizer and no ROM. No surgery recommended secondary to body habitus and leg wounds. PT/OT evaluating for rehab; SNF vs CIR. MRI obtained on 9/30 with evidence of  significant ligamentous injury in addition to lateral patellar dislocation. Discussed with Dr. Erlinda Hong on 9/30 who discussed with Dr. Marlou Sa with new plan for surgical fixation. -Oxycodone prn -Orthopedic surgery recommendations: plan for external fixation of LLE knee dislocation today (10/5)  Left knee effusion Possible left knee hematoma In setting of fall and trauma to knee while on Coumadin with supratherapeutic INR. Improving. No surgical intervention.  Right knee pain No obvious abnormalities. Right knee x-ray with small effusion, otherwise no osseous abnormalities.  Normocytic anemia In setting of CKD in addition to likely hematoma. Patient received 2 units of PRBC and 1 unit of FFP this admission. Hemoglobin stable.  AKI on CKD stage IIIb Baseline creatinine of about 1.4-1.5. Creatinine of 1.50 on admission with peak creatinine of 2.87. Possible AKI from ATN related to hypoperfusion from blood loss. Improved with IV fluids.  Hyperkalemia Resolved.  History of pulmonary embolism Patient is on Coumadin as an outpatient. Requiring bridge secondary to cessation of Coumadin and vitamin K INR reversal on admission for acute hemorrhage. Coumadin discontinued 10/2. INR of 1.7 today -Holding Coumadin -Heparin IV per pharmacy  Bradycardia 1st degree heart block Recurrent. Asymptomatic. In setting of known sleep apnea but occurs while awake as well. EKG significant for 1st degree AV block which does not appear to be new. Telemetry with heart rate down to high 30s while awake -Continue telemetry -Cardiology recommendations: continue telemetry; if no recurrent bradycardia, no further intervention  CAD Asymptomatic.  Diabetic neuropathy Diabetes mellitus, type 2 Hemoglobin A1C of 6.1%. Patient is on metformin, Actos and Lantus as an outpatient. -Continue Semglee 50 units qHS, Novolog 3 units TID, SSI,  Actos  GERD -Continue Protonix  OSA -Continue CPAP qHS  Primary  hypertension Patient is on quinapril and amlodipine as an outpatient which were initially held this admission. Blood pressure uncontrolled. -Amlodipine 10 mg daily and lisinopril 40 mg daily (substituted for home quinapril while inpatient)  Hyperlipidemia Patient is on pravastatin as an outpatient which is held currently.  Constipation In setting of opiate use. -Continue Colace, Senokot, Miralax -Dulcolax suppository prn  Pressure injury Mid-nose, not present on admission. Secondary to CPAP mask  Obesity Body mass index is 63.41 kg/m.    DVT prophylaxis: Heparin IV/Coumadin Code Status:   Code Status: Full Code Family Communication: Wife in the hallway Disposition Plan: Discharge to SNF when bed is available and pending orthopedic recommendations/management   Consultants:  Orthopedic surgery Cardiology  Procedures:  LEFT KNEE REDUCTION  Antimicrobials: Keflex    Subjective: Continues to have some bleeding from leg wound. No other issues.  Objective: Vitals:   06/30/21 1521 06/30/21 1927 06/30/21 2144 07/01/21 0553  BP: (!) 186/42  (!) 157/43 (!) 154/43  Pulse: 77 78 83 71  Resp: 14 (!) 23  18  Temp: 98.5 F (36.9 C)  98.9 F (37.2 C) 97.9 F (36.6 C)  TempSrc: Oral  Oral Oral  SpO2: 98% 93% 95%   Weight:      Height:        Intake/Output Summary (Last 24 hours) at 07/01/2021 1035 Last data filed at 07/01/2021 0949 Gross per 24 hour  Intake 240 ml  Output 1600 ml  Net -1360 ml    Filed Weights   06/11/21 1326 06/25/21 0900  Weight: (!) 208.7 kg (!) 218 kg    Examination:  General: Well appearing, no distress   Data Reviewed: I have personally reviewed following labs and imaging studies  CBC Lab Results  Component Value Date   WBC 6.5 07/01/2021   RBC 3.84 (L) 07/01/2021   HGB 11.0 (L) 07/01/2021   HCT 36.4 (L) 07/01/2021   MCV 94.8 07/01/2021   MCH 28.6 07/01/2021   PLT 396 07/01/2021   MCHC 30.2 07/01/2021   RDW 17.2 (H) 07/01/2021    LYMPHSABS 1.7 06/17/2021   MONOABS 0.7 06/17/2021   EOSABS 0.4 06/17/2021   BASOSABS 0.1 16/06/9603     Last metabolic panel Lab Results  Component Value Date   NA 135 07/01/2021   K 5.1 07/01/2021   CL 102 07/01/2021   CO2 27 07/01/2021   BUN 42 (H) 07/01/2021   CREATININE 1.17 07/01/2021   GLUCOSE 157 (H) 07/01/2021   GFRNONAA >60 07/01/2021   GFRAA 51 (L) 04/05/2020   CALCIUM 8.5 (L) 07/01/2021   PHOS 2.8 04/05/2020   PROT 5.9 (L) 06/24/2021   ALBUMIN 2.4 (L) 06/24/2021   BILITOT 0.9 06/24/2021   ALKPHOS 89 06/24/2021   AST 16 06/24/2021   ALT 16 06/24/2021   ANIONGAP 6 07/01/2021    CBG (last 3)  Recent Labs    06/30/21 1729 06/30/21 2148 07/01/21 0757  GLUCAP 145* 171* 108*      GFR: Estimated Creatinine Clearance: 121.9 mL/min (by C-G formula based on SCr of 1.17 mg/dL).  Coagulation Profile: Recent Labs  Lab 06/27/21 0512 06/28/21 0456 06/29/21 0435 06/30/21 0451 07/01/21 0446  INR 2.5* 2.3* 2.2* 2.2* 1.7*     No results found for this or any previous visit (from the past 240 hour(s)).      Radiology Studies: No results found.      Scheduled Meds:  (feeding supplement)  PROSource Plus  30 mL Oral BID BM   amLODipine  10 mg Oral Daily   cephALEXin  250 mg Oral QHS   docusate sodium  100 mg Oral BID   feeding supplement (GLUCERNA SHAKE)  237 mL Oral Q24H   FLUoxetine  40 mg Oral QPM   insulin aspart  0-20 Units Subcutaneous TID WC   insulin aspart  0-5 Units Subcutaneous QHS   insulin aspart  3 Units Subcutaneous TID WC   insulin glargine-yfgn  50 Units Subcutaneous QHS   lisinopril  40 mg Oral Daily   methocarbamol  1,000 mg Oral BID   multivitamin with minerals  1 tablet Oral Daily   pantoprazole  40 mg Oral Daily   pioglitazone  45 mg Oral Daily   polyethylene glycol  17 g Oral BID   Ensure Max Protein  11 oz Oral Daily   senna  1 tablet Oral Daily   tamsulosin  0.4 mg Oral Daily   Continuous Infusions:  heparin 2,100  Units/hr (07/01/21 0949)      LOS: 19 days     Cordelia Poche, MD [facility] 07/01/2021, 10:35 AM  If 7PM-7AM, please contact night-coverage [email or web address]

## 2021-07-01 NOTE — Progress Notes (Signed)
PT Cancellation Note  Patient Details Name: Joseph [name]. MRN: [id number] DOB: 09-10-1956   Cancelled Treatment:    Reason Eval/Treat Not Completed: Medical issues which prohibited therapy, will hold PT until after surgery and new MD orders are written for mobility.   Claretha Cooper 07/01/2021, 2:15 PM Tresa Endo PT Acute Rehabilitation Services Pager [phone] Office [phone]

## 2021-07-01 NOTE — Progress Notes (Signed)
Joseph [name] is tentatively scheduled today for knee reduction and external fixator placement and hematoma evacuation with wound VAC placement.  INR 1.7.  That was drawn at 4:00 this morning.  Anticipate it would likely be closer to 1.5 by the time we actually do surgery.  Discussed the risk and benefits with him on Sunday.  These include but not limited to need for more surgery and potential limb loss.  All questions answered.

## 2021-07-01 NOTE — Anesthesia Procedure Notes (Signed)
Procedure Name: LMA Insertion Date/Time: 07/01/2021 6:58 PM Performed by: Lissa Morales, CRNA Pre-anesthesia Checklist: Patient identified, Emergency Drugs available, Suction available and Patient being monitored Patient Re-evaluated:Patient Re-evaluated prior to induction Oxygen Delivery Method: Circle system utilized Preoxygenation: Pre-oxygenation with 100% oxygen Induction Type: IV induction Ventilation: Mask ventilation without difficulty LMA: LMA with gastric port inserted LMA Size: 5.0 Tube type: Oral Number of attempts: 1 Placement Confirmation: ETT inserted through vocal cords under direct vision, positive ETCO2 and breath sounds checked- equal and bilateral Tube secured with: Tape Dental Injury: Teeth and Oropharynx as per pre-operative assessment  Comments: Small mouth

## 2021-07-01 NOTE — Brief Op Note (Signed)
   07/01/2021  9:18 PM  PATIENT:  [name].  65 y.o. male  PRE-OPERATIVE DIAGNOSIS:  l left knee dislocation with hematoma and possible infection  POST-OPERATIVE DIAGNOSIS: Left knee dislocation reduction with external fixation placement and extensive debridement of hematoma and devitalized skin subtenons tissue muscle fascia.  Placement of wound VAC over an area that was 23 cm x 15 cm  PROCEDURE:  Procedure(s): LEFT KNEE REDUCTION EXTERNAL FIXATION WITH EVACUATION HEMATOMA  SURGEON:  Surgeon(s): Marlou Sa, Tonna Corner, MD  ASSISTANT: Annie Main, PA Alena Bills, MS 3  ANESTHESIA:   general  EBL: 100 ml    Total I/O In: 1600 [I.V.:1600] Out: 350 [Blood:350]  BLOOD ADMINISTERED: none  DRAINS:  Hemovac x1    LOCAL MEDICATIONS USED:  none  SPECIMEN: Cultures x2c OUNTS:  YES  TOURNIQUET:  * Missing tourniquet times found for documented tourniquets in log: [id number] *  DICTATION: .Other Dictation: Dictation Number done  PLAN OF CARE: Admit to inpatient   PATIENT DISPOSITION:  PACU - hemodynamically stable

## 2021-07-01 NOTE — Anesthesia Procedure Notes (Signed)
Date/Time: 07/01/2021 9:02 PM Performed by: Cynda Familia, CRNA Oxygen Delivery Method: Simple face mask Placement Confirmation: positive ETCO2 and breath sounds checked- equal and bilateral Dental Injury: Teeth and Oropharynx as per pre-operative assessment

## 2021-07-01 NOTE — Progress Notes (Signed)
ANTICOAGULATION CONSULT NOTE - Initial Consult  Pharmacy Consult for Heparin Indication: h/o pulmonary embolus  Allergies  Allergen Reactions   Tape     Peels skin on legs   Chlorhexidine Rash    Wipes    Patient Measurements: Height: 6\' 1"  (185.4 cm) Weight: (!) 218 kg (480 lb 9.6 oz) IBW/kg (Calculated) : 79.9 Heparin Dosing Weight: 132.5 kg  Vital Signs Temp: 97.9 F (36.6 C) (10/05 0553) Temp Source: Oral (10/05 0553) BP: 154/43 (10/05 0553) Pulse Rate: 71 (10/05 0553)  Labs: Recent Labs    06/29/21 0435 06/30/21 0451 07/01/21 0446  HGB 10.5* 10.9* 11.0*  HCT 34.7* 35.8* 36.4*  PLT 430* 413* 396  LABPROT 24.6* 24.3* 20.3*  INR 2.2* 2.2* 1.7*  CREATININE  --   --  1.17    Estimated Creatinine Clearance: 121.9 mL/min (by C-G formula based on SCr of 1.17 mg/dL).   Medical History: Past Medical History:  Diagnosis Date   Anxiety state, unspecified    Aortic atherosclerosis ([place]) 06/12/2021   Depression    DVT (deep venous thrombosis) ([place]) 2004   Left knee   Edema    Esophageal reflux    occ   Essential hypertension, benign    Essential hypertension, benign    Falls frequently    History of iron deficiency anemia    Morbid obesity (HCC)    Myalgia and myositis, unspecified    BACK AND LEGS   OSA on CPAP    Peripheral vascular disease, unspecified (HCC)    Personal history of PE (pulmonary embolism) 2004   Polyneuropathy in diabetes(357.2)    Poor venous access    PT STATES DIFFICULT TO DRAW BLOOD FROM HIS VEINS   Pure hypercholesterolemia    Shortness of breath    DUE TO WEIGHT AND LOSS OF MOBILITY   Type II or unspecified type diabetes mellitus with neurological manifestations, not stated as uncontrolled(250.60)    Type II or unspecified type diabetes mellitus without mention of complication, not stated as uncontrolled    Type II or unspecified type diabetes mellitus without mention of complication, uncontrolled    Varicose veins of lower  extremities with inflammation    Wears glasses    Wound infection after surgery    required hospitalization    Medications:  PTA Warfarin Dosing: Take 5 mg on Sun/Mon/Wed/Fri evening  Take 7.5 mg on Tue/Thur/Sat evening INR was supratherapeutic (6.2) at admission  Assessment: 65 YOM on lifelong warfarin therapy for hx of PE, body habitus, and sedentary lifestyle admitted on 9/15 with left knee dislocation and effusion concerning for hematoma. At admission, warfarin was held, vitamin K was given for supratherapeutic INR, and pt was transfused.   Pharmacy consulted to resume warfarin on 9/23 with heparin bridge while INR subtherapeutic. Heparin IV will begin today since INR is <2. Will not give bolus since no acute clot. Surgery is anticipated for today once INR <1.5.  Significant Events: Warfarin held 9/15-9/22 Vitamin K given 9/15-9/17 Heparin stopped 9/30  Today, 07/01/21 INR: 1.7 CBC: Hgb improving, plt wnl Continues to have bruising, but no bleeding reported  Goal of Therapy:  Heparin level 0.3-0.7 units/ml Monitor platelets by anticoagulation protocol: Yes   Plan:  Start heparin infusion at 2100 units/hr  6-hr heparin level  Monitor for signs of bleeding  Monitor [place], Class [employer] 2023

## 2021-07-02 ENCOUNTER — Encounter (HOSPITAL_COMMUNITY): Payer: Self-pay | Admitting: Orthopedic Surgery

## 2021-07-02 ENCOUNTER — Other Ambulatory Visit (HOSPITAL_COMMUNITY): Payer: HMO

## 2021-07-02 DIAGNOSIS — S83105A Unspecified dislocation of left knee, initial encounter: Secondary | ICD-10-CM | POA: Diagnosis not present

## 2021-07-02 DIAGNOSIS — L89150 Pressure ulcer of sacral region, unstageable: Secondary | ICD-10-CM

## 2021-07-02 LAB — CBC
HCT: 35.8 % — ABNORMAL LOW (ref 39.0–52.0)
Hemoglobin: 10.6 g/dL — ABNORMAL LOW (ref 13.0–17.0)
MCH: 28.6 pg (ref 26.0–34.0)
MCHC: 29.6 g/dL — ABNORMAL LOW (ref 30.0–36.0)
MCV: 96.8 fL (ref 80.0–100.0)
Platelets: 344 10*3/uL (ref 150–400)
RBC: 3.7 MIL/uL — ABNORMAL LOW (ref 4.22–5.81)
RDW: 17 % — ABNORMAL HIGH (ref 11.5–15.5)
WBC: 8.9 10*3/uL (ref 4.0–10.5)
nRBC: 0 % (ref 0.0–0.2)

## 2021-07-02 LAB — GLUCOSE, CAPILLARY
Glucose-Capillary: 234 mg/dL — ABNORMAL HIGH (ref 70–99)
Glucose-Capillary: 171 mg/dL — ABNORMAL HIGH (ref 70–99)
Glucose-Capillary: 199 mg/dL — ABNORMAL HIGH (ref 70–99)
Glucose-Capillary: 202 mg/dL — ABNORMAL HIGH (ref 70–99)

## 2021-07-02 LAB — PROTIME-INR
INR: 1.7 — ABNORMAL HIGH (ref 0.8–1.2)
Prothrombin Time: 19.8 seconds — ABNORMAL HIGH (ref 11.4–15.2)

## 2021-07-02 LAB — HEPARIN LEVEL (UNFRACTIONATED): Heparin Unfractionated: 0.28 IU/mL — ABNORMAL LOW (ref 0.30–0.70)

## 2021-07-02 MED ORDER — CEPHALEXIN 250 MG PO CAPS
250.0000 mg | ORAL_CAPSULE | Freq: Every day | ORAL | Status: DC
  Administered 2021-07-06 – 2021-07-09 (×4): 250 mg via ORAL
  Filled 2021-07-02 (×5): qty 1

## 2021-07-02 MED ORDER — HEPARIN (PORCINE) 25000 UT/250ML-% IV SOLN
2600.0000 [IU]/h | INTRAVENOUS | Status: DC
  Administered 2021-07-02 – 2021-07-03 (×3): 2500 [IU]/h via INTRAVENOUS
  Administered 2021-07-04: 2550 [IU]/h via INTRAVENOUS
  Administered 2021-07-04: 2500 [IU]/h via INTRAVENOUS
  Administered 2021-07-04: 2550 [IU]/h via INTRAVENOUS
  Administered 2021-07-05 – 2021-07-07 (×6): 2600 [IU]/h via INTRAVENOUS
  Filled 2021-07-02 (×16): qty 250

## 2021-07-02 MED ORDER — HYDROMORPHONE HCL 1 MG/ML IJ SOLN
0.5000 mg | INTRAMUSCULAR | Status: DC | PRN
  Administered 2021-07-02 – 2021-07-17 (×57): 0.5 mg via INTRAVENOUS
  Filled 2021-07-02 (×56): qty 0.5

## 2021-07-02 MED ORDER — INSULIN GLARGINE-YFGN 100 UNIT/ML ~~LOC~~ SOLN
55.0000 [IU] | Freq: Every day | SUBCUTANEOUS | Status: DC
Start: 2021-07-02 — End: 2021-07-29
  Administered 2021-07-02 – 2021-07-28 (×27): 55 [IU] via SUBCUTANEOUS
  Filled 2021-07-02 (×31): qty 0.55

## 2021-07-02 NOTE — Progress Notes (Signed)
PROGRESS NOTE  Joseph [name].  DOB: Dec 06, 1955  PCP: Donald Prose, MD NGE:[id number]  DOA: 06/11/2021  LOS: 20 days  Hospital Day: 77   Chief Complaint  Patient presents with   Leg Swelling   Leg Pain    left    Brief narrative: Joseph Hanton. is a 65 y.o. male with PMH significant for morbid obesity, OSA on CPAP, DM2, HTN, HLD, PAD, DVT/PE on Coumadin, GERD, anxiety/depression, polyneuropathy, multiple falls who was living at home. Patient presented to the ED on 9/15 after fall at home resulting in left knee pain.  He was found to have left knee dislocation and fracture fragments with associated left knee effusion with concern for possible hematoma in setting of supratherapeutic INR.  Admitted to hospitalist service Orthopedic surgery was consulted. Patient underwent closed reduction of left knee dislocation but no surgical management was planned because of patient's body habitus. See below for details.  Subjective: Patient was seen and examined this morning.  Elderly morbidly obese Caucasian male.  Lying down in bed.  Not in distress.  Family at bedside.  Assessment/Plan: Left knee dislocation status post reduction with recurrent subluxation Left knee hematoma -Presented with fall.  X-ray showed left knee dislocation with several fracture fragments and hematoma. -Orthopedic surgery was consulted. Patient underwent and reduced patient's knee with recommendations for nonweightbearing status of left leg in addition to strict use of an immobilizer and no ROM.  -Because of patient's heavy body habitus, surgical fixation was not recommended at the time. -However, MRI obtained on 9/30 showed evidence of significant ligamentous injury in addition to lateral patellar dislocation.  Orthopedics team revisited the decision and made a plan of surgical fixation.  -On 10/5, patient underwent left knee extensive debridement, reduction of knee dislocation, external fixation spanning of knee  dislocation and a wound VAC application. -Per orthopedics Dr. Randel Pigg recommendation we will transfer the patient to [facility] to be seen by Dr. Sharol Given for further evaluation management  History of pulm embolism -Patient was on Coumadin as an outpatient.  Currently on heparin drip.  AKI on CKD stage IIIb -Creatinine peaked at 2.87 probably related to hypoperfusion from blood loss, improved down to normal subsequently. Recent Labs    06/14/21 0330 06/15/21 0256 06/16/21 0339 06/17/21 0325 06/18/21 0340 06/19/21 0334 06/20/21 0014 06/23/21 0411 06/24/21 0507 07/01/21 0446  BUN 66* 74* 70* 70* 69* 60* 50* 40* 37* 42*  CREATININE 2.87* 2.57* 1.93* 1.65* 1.52* 1.35* 1.32* 1.26* 1.24 1.17   Type 2 diabetes mellitus Diabetic neuropathy -A1c 6.1 on 9/22 -Currently on Actos, Semglee 50 units nightly with NovoLog 3 units 3 times daily and sliding cell insulin.  Metformin on hold. -Blood sugar level was elevated to over 200 this morning.  We will increase the dose of Semglee. Recent Labs  Lab 07/01/21 2118 07/01/21 2339 07/02/21 0742 07/02/21 1147 07/02/21 1654  GLUCAP 138* 179* 234* 171* 199*   Essential hypertension -Currently controlled on amlodipine and lisinopril.  Hyperlipidemia -Pravastatin on hold.  Chronic anemia -Stable hemoglobin of 10.  In this admission, patient received a total of 2 units of PRBC and 1 unit of FFP. 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 Asymptomatic. In setting of known sleep apnea but occurs while awake as well. EKG significant for 1st degree AV block which  does not appear to be new. Telemetry with heart rate down to high 30s while awake -Continue telemetry -Cardiology recommendations: continue telemetry; if no recurrent bradycardia, no further intervention  Morbid obesity  -Body  mass index is 63.41 kg/m. Patient has been advised to make an attempt to improve diet and exercise patterns to aid in weight loss.  GERD -Continue Protonix   OSA -Continue CPAP qHS   Constipation -in setting of opiate use. -Continue Colace, Senokot, Miralax -Dulcolax suppository prn   Pressure injury Mid-nose, not present on admission. Secondary to CPAP mask   Mobility: PT eval postprocedure Code Status:   Code Status: Full Code  Nutritional status: Body mass index is 63.41 kg/m. Nutrition Problem: Inadequate oral intake Etiology: decreased appetite Signs/Symptoms: per patient/family report Diet:  Diet Order             Diet Carb Modified Fluid consistency: Thin; Room service appropriate? Yes  Diet effective now                  DVT prophylaxis:  SCDs Start: 07/01/21 2219 SCDs Start: 06/11/21 2153   Antimicrobials: Not on scheduled antibiotics Fluid: None Consultants: Orthopedics Family Communication: Family at bedside  Status is: Inpatient  Remains inpatient appropriate because: Plan for revision of surgery next week  Dispo: The patient is from: Home              Anticipated d/c is to: SNF likely              Patient currently is not medically stable to d/c.   Difficult to place patient No     Infusions:    ceFAZolin (ANCEF) IV 2 g (07/02/21 1507)   heparin 2,500 Units/hr (07/02/21 1728)   methocarbamol (ROBAXIN) IV 500 mg (07/01/21 2138)    Scheduled Meds:  (feeding supplement) PROSource Plus  30 mL Oral BID BM   acetaminophen  1,000 mg Oral Q6H   amLODipine  10 mg Oral Daily   [START ON 07/06/2021] cephALEXin  250 mg Oral QHS   docusate sodium  100 mg Oral BID   feeding supplement (GLUCERNA SHAKE)  237 mL Oral Q24H   FLUoxetine  40 mg Oral QPM   insulin aspart  0-20 Units Subcutaneous TID WC   insulin aspart  0-5 Units Subcutaneous QHS   insulin aspart  3 Units Subcutaneous TID WC   insulin glargine-yfgn  55 Units Subcutaneous QHS    lisinopril  40 mg Oral Daily   methocarbamol  1,000 mg Oral BID   multivitamin with minerals  1 tablet Oral Daily   pantoprazole  40 mg Oral Daily   pioglitazone  45 mg Oral Daily   polyethylene glycol  17 g Oral BID   Ensure Max Protein  11 oz Oral Daily   senna  1 tablet Oral Daily   tamsulosin  0.4 mg Oral Daily    Antimicrobials: Anti-infectives (From admission, onward)    Start     Dose/Rate Route Frequency Ordered Stop   07/06/21 2200  cephALEXin (KEFLEX) capsule 250 mg        250 mg Oral Daily at bedtime 07/02/21 1232     07/02/21 0600  ceFAZolin (ANCEF) IVPB 3g/100 mL premix  Status:  Discontinued        3 g 200 mL/hr over 30 Minutes Intravenous On call to O.R. 07/01/21 1458 07/01/21 1502   07/02/21 0300  ceFAZolin (ANCEF) IVPB 2g/100 mL premix        2 g  200 mL/hr over 30 Minutes Intravenous Every 8 hours 07/01/21 2218 07/06/21 0559   07/01/21 1600  ceFAZolin (ANCEF) IVPB 3g/100 mL premix        3 g 200 mL/hr over 30 Minutes Intravenous On call to O.R. 07/01/21 1502 07/01/21 1850   06/11/21 2230  cephALEXin (KEFLEX) capsule 250 mg  Status:  Discontinued        250 mg Oral Daily at bedtime 06/11/21 2221 07/02/21 1232       PRN meds: acetaminophen **OR** acetaminophen, bisacodyl, HYDROmorphone (DILAUDID) injection, methocarbamol **OR** methocarbamol (ROBAXIN) IV, metoCLOPramide **OR** metoCLOPramide (REGLAN) injection, ondansetron **OR** ondansetron (ZOFRAN) IV, oxyCODONE **AND** oxyCODONE, silver sulfADIAZINE   Objective: Vitals:   07/02/21 0746 07/02/21 1658  BP: (!) 138/39 (!) 149/41  Pulse: 72 79  Resp: 16 16  Temp: 97.6 F (36.4 C) 97.9 F (36.6 C)  SpO2: 99% 95%    Intake/Output Summary (Last 24 hours) at 07/02/2021 1729 Last data filed at 07/02/2021 0600 Gross per 24 hour  Intake 1850 ml  Output 550 ml  Net 1300 ml   Filed Weights   06/11/21 1326 06/25/21 0900 07/01/21 1518  Weight: (!) 208.7 kg (!) 218 kg (!) 218 kg   Weight change:  Body mass  index is 63.41 kg/m.   Physical Exam: General exam: Pleasant elderly morbidly obese Caucasian male. Skin: No rashes, lesions or ulcers. HEENT: Atraumatic, normocephalic, no obvious bleeding Lungs: Clear to auscultation bilaterally CVS: Regular rate and rhythm, no murmur GI/Abd soft, distended from obesity, nontender, bowel sound present CNS: Alert, awake, oriented x3 Psychiatry: Mood appropriate Extremities: Left leg in external fixator  Data Review: I have personally reviewed the laboratory data and studies available.  Recent Labs  Lab 06/26/21 0452 06/29/21 0435 06/30/21 0451 07/01/21 0446 07/02/21 0504  WBC 6.6 5.7 5.6 6.5 8.9  HGB 10.3* 10.5* 10.9* 11.0* 10.6*  HCT 34.2* 34.7* 35.8* 36.4* 35.8*  MCV 95.0 94.6 95.2 94.8 96.8  PLT 548* 430* 413* 396 344   Recent Labs  Lab 07/01/21 0446  NA 135  K 5.1  CL 102  CO2 27  GLUCOSE 157*  BUN 42*  CREATININE 1.17  CALCIUM 8.5*    F/u labs ordered Unresulted Labs (From admission, onward)     Start     Ordered   07/03/21 0000  Heparin level (unfractionated)  Once,   R       Question:  Specimen collection method  Answer:  Lab=Lab collect   07/02/21 1707   06/30/21 0500  CBC  Daily,   R     Question:  Specimen collection method  Answer:  Lab=Lab collect   06/28/21 1723   06/21/21 0700  Protime-INR  Daily,   R     Question:  Specimen collection method  Answer:  Lab=Lab collect   06/20/21 0139            Signed, Terrilee Croak, MD [facility] 07/02/2021

## 2021-07-02 NOTE — Progress Notes (Signed)
IP rehab admissions - Noted patient has not made much progress in the last several sessions.  Feel he will need SNF placement.  Per case manager, patient has SNF bed offers.  Patient cannot tolerate 3 hours of therapy a day at this time.  I will sign off for CIR with recommendations for SNF placement.  Call for questions.  [phone]

## 2021-07-02 NOTE — Progress Notes (Signed)
Patient stable.  Pain controlled. Left lower extremity external fixator in place.  Wound VAC in place. Plan at this time is return to the OR next Wednesday with Dr. Sharol Given.  He will manage the complex wound around the left knee.  Patient will need long-term IV antibiotics due to infection penetrating into and around the knee joint.  Heparin has been restarted. Once the bed situation improves patient will need to be transferred sometime next week to [facility] for further wound management and possible limb management with Dr. Sharol Given.

## 2021-07-02 NOTE — Anesthesia Postprocedure Evaluation (Signed)
Anesthesia Post Note  Patient: Nohlan Burdin.  Procedure(s) Performed: LEFT KNEE REDUCTION EXTERNAL FIXATION WITH EVACUATION HEMATOMA (Left: Leg Lower)     Patient location during evaluation: PACU Anesthesia Type: General Level of consciousness: awake and alert Pain management: pain level controlled Vital Signs Assessment: post-procedure vital signs reviewed and stable Respiratory status: spontaneous breathing, nonlabored ventilation and respiratory function stable Cardiovascular status: blood pressure returned to baseline and stable Postop Assessment: no apparent nausea or vomiting Anesthetic complications: no   No notable events documented.  Last Vitals:  Vitals:   07/02/21 0318 07/02/21 0746  BP: (!) 133/29 (!) 138/39  Pulse: 67 72  Resp: 16 16  Temp: (!) 36.4 C 36.4 C  SpO2: 96% 99%    Last Pain:  Vitals:   07/02/21 0518  TempSrc:   PainSc: Asleep                 Lynda Rainwater

## 2021-07-02 NOTE — Plan of Care (Signed)
  Problem: Education: Goal: Knowledge of General Education information will improve Description: Including pain rating scale, medication(s)/side effects and non-pharmacologic comfort measures Outcome: Completed/Met

## 2021-07-02 NOTE — TOC Progression Note (Signed)
Transition of Care (TOC) - Progression Note    Patient Details  Name: Joseph [name]. MRN: [id number] Date of Birth: 08-20-1956  Transition of [facility]) CM/SW Contact  Amity Roes, Juliann Pulse, RN Phone Number: 07/02/2021, 2:14 PM  Clinical Narrative: Noted for ortho surgery @ [facility]-await transfer to [facility] likely weekend or Monday transfer.      Expected Discharge Plan: Skilled Nursing Facility Barriers to Discharge: Continued Medical Work up  Expected Discharge Plan and Services Expected Discharge Plan: [place]                                               Social Determinants of Health (SDOH) Interventions    Readmission Risk Interventions No flowsheet data found.

## 2021-07-02 NOTE — Progress Notes (Addendum)
ANTICOAGULATION CONSULT NOTE - Follow Up Consult  Pharmacy Consult for Heparin Indication: h/o pulmonary embolus  Allergies  Allergen Reactions   Tape     Peels skin on legs   Chlorhexidine Rash    Wipes    Patient Measurements: Height: 6\' 1"  (185.4 cm) Weight: (!) 218 kg (480 lb 9.6 oz) IBW/kg (Calculated) : 79.9 Heparin Dosing Weight: 132.5 kg  Vital Signs: Temp: 97.6 F (36.4 C) (10/06 0746) BP: 138/39 (10/06 0746) Pulse Rate: 72 (10/06 0746)  Labs: Recent Labs    06/30/21 0451 07/01/21 0446 07/02/21 0504  HGB 10.9* 11.0* 10.6*  HCT 35.8* 36.4* 35.8*  PLT 413* 396 344  LABPROT 24.3* 20.3* 19.8*  INR 2.2* 1.7* 1.7*  CREATININE  --  1.17  --     Estimated Creatinine Clearance: 121.9 mL/min (by C-G formula based on SCr of 1.17 mg/dL).   Medications:  PTA Warfarin:  5 mg on Sun/Mon/Wed/Fri evening, and 7.5 mg on Tue/Thur/Sat evening.   INR was supratherapeutic (6.2) at admission  Assessment: 89 YOM on lifelong warfarin therapy for hx of PE, body habitus, and sedentary lifestyle admitted on 9/15 with left knee dislocation and effusion concerning for hematoma. At admission, warfarin was held, vitamin K was given for supratherapeutic INR, and pt was transfused.  Most recent pharmacy consult to begin heparin when INR decreased to < 2 while held for surgery.  Heparin IV began on 10/5 with an INR of 1.7.   Significant Events: Warfarin held 9/15-9/22 Vitamin K given 9/15-9/17 Warfarin resumed 9/23 with heparin bridge (completed 9/30) Warfarin held 10/3 Heparin started 10/5 for INR < 2, held 10/5 PM for surgery, restarted 10/6 AM  Today, 07/02/21 INR remains subtherapeutic at 1.7 Heparin ordered to be resumed at 0700, started at 0845 CBC: Hgb low and decreased to 10.6, Plt decreased to WNL No postop bleeding reported.  Wound VAC in place.  No complications per RN.   Goal of Therapy:  Heparin level 0.3-0.7 units/ml Monitor platelets by anticoagulation protocol:  Yes   Plan:  Continue Heparin infusion at 2100 units/hr 6-hr heparin level Daily heparin level and CBC. Monitor for s/sx bleeding Follow up plans for further surgery next week with Dr. Alita Chyle PharmD/MBA Candidate [facility], Class of 2023   I have reviewed and agree with the assessment and plan as stated. Gretta Arab PharmD, BCPS Clinical Pharmacist [facility] main pharmacy [phone] 07/02/2021 1:20 PM   Addendum: Heparin level 0.28, slightly subtherapeutic on heparin 2100 units/hr. No new bleeding or complications reported Plan: Increase heparin IV infusion to 2500 units/hr Heparin level in 6 hours  Daily heparin level and CBC  Gretta Arab PharmD, BCPS Clinical Pharmacist [facility] main pharmacy [phone] 07/02/2021 4:58 PM

## 2021-07-02 NOTE — Op Note (Signed)
NAME: DOMENICO, ACHORD MEDICAL RECORD NO: [id number] ACCOUNT NO: [id number] DATE OF BIRTH: 06/11/1956 FACILITY: [facility] LOCATION: [id number] PHYSICIAN: Yetta Barre. Marlou Sa, MD  Operative Report   DATE OF PROCEDURE: 07/01/2021  PREOPERATIVE DIAGNOSES:  Left knee hematoma, status post knee dislocation with recurrent subluxation of the knee joint.  POSTOPERATIVE DIAGNOSES:  Left knee hematoma, status post knee dislocation with recurrent subluxation of the knee joint.  PROCEDURES:  1.  Left knee extensive debridement using scalpel, curette, and rongeur of devitalized skin, subcutaneous tissue, muscle and fascia. 2.  Reduction of knee dislocation. 3.  External fixation spanning of knee dislocation. 4.  Application of wound VAC over an area 23 x 18 cm.  SURGEON ATTENDING:  Meredith Pel, MD  ASSISTANT:  Annie Main.  INDICATIONS:  The patient is a 65 year old patient who dislocated his knee about 2 weeks ago.  He has developed significant hematoma below the skin and has recurrent subluxation of the knee based on MRI scan.  DESCRIPTION OF PROCEDURE:  The patient was brought to the operating room where general anesthetic was induced.  Preoperative antibiotics administered.  Timeout was called.  Left leg was scrubbed with Betadine, which was allowed to dry and then prepped  with alcohol.  The patient's pannus was taped out of the way proximally.  Next, the left leg was examined.  There was a hematoma with devitalized skin overlying large hematoma.  This measured approximately 23 x 18 cm.  This skin was debrided.  The skin  was not viable.  There was infection below the skin, which was sent for culture.  Had an anaerobic component to it.  At this time, using a curette and rongeur, debridement was performed of devitalized tissue.  The hematoma tracked down to the knee joint,  which was open.  This was thoroughly irrigated with 6 liters of irrigating solution.  Next, a wound VAC was placed, which was a  Cleanse wound VAC.  Good seal was obtained.  Next, 2 pins were placed in the tibia, 2 pins placed in the femur under  fluoroscopic guidance.  A spanning ex-fix was then placed.  We had to place the pins more lateral on the femur than usual because of pannus formation and the extent of the hematoma and cellulitis.  There was no extension along fascial planes of this  cellulitis and infection.  Under fluoroscopic guidance, the knee was reduced as best possible with centering of the patella and reduction of the joint.  The ex-fix was tightened.  Fluoroscopic examination demonstrated good reduction of the knee joint.   Patella was relocated on both fluoroscopic exam and manual exam.  At this time, pin sites were irrigated and Bactroban cream and bulky dressing placed around that with Xeroform.  Ace wrap applied around the leg.  Foot remained perfused during the case.   At this time, due to the infection and hematoma and venous stasis distally, I think there is a reasonably high likelihood that limb salvage may not be possible.  Plan is for transfer to Dr. Sharol Given for further management of this complex wound around the knee next Wednesday.  Luke's assistance was required at all times during the case for retraction, mobilization of tissue, opening, closing.  His assistance was a  medical necessity.   SHW D: 07/01/2021 9:26:16 pm T: 07/02/2021 4:01:00 am  JOB: [id number]/ [id number]

## 2021-07-03 ENCOUNTER — Inpatient Hospital Stay (HOSPITAL_COMMUNITY): Payer: HMO

## 2021-07-03 ENCOUNTER — Inpatient Hospital Stay: Payer: HMO

## 2021-07-03 ENCOUNTER — Other Ambulatory Visit: Payer: Self-pay | Admitting: Physician Assistant

## 2021-07-03 ENCOUNTER — Telehealth: Payer: Self-pay

## 2021-07-03 DIAGNOSIS — S83105A Unspecified dislocation of left knee, initial encounter: Secondary | ICD-10-CM | POA: Diagnosis not present

## 2021-07-03 DIAGNOSIS — I493 Ventricular premature depolarization: Secondary | ICD-10-CM

## 2021-07-03 DIAGNOSIS — I440 Atrioventricular block, first degree: Secondary | ICD-10-CM

## 2021-07-03 DIAGNOSIS — R001 Bradycardia, unspecified: Secondary | ICD-10-CM

## 2021-07-03 LAB — ECHOCARDIOGRAM COMPLETE
AR max vel: 2.89 cm2
AV Area VTI: 2.83 cm2
AV Area mean vel: 2.85 cm2
AV Mean grad: 2 mmHg
AV Peak grad: 3.8 mmHg
Ao pk vel: 0.97 m/s
Height: 73 in
MV M vel: 1.51 m/s
MV Peak grad: 9.1 mmHg
Weight: 7689.64 oz

## 2021-07-03 LAB — HEPARIN LEVEL (UNFRACTIONATED)
Heparin Unfractionated: 0.49 IU/mL (ref 0.30–0.70)
Heparin Unfractionated: 0.59 IU/mL (ref 0.30–0.70)

## 2021-07-03 LAB — BASIC METABOLIC PANEL
Anion gap: 8 (ref 5–15)
BUN: 63 mg/dL — ABNORMAL HIGH (ref 8–23)
CO2: 23 mmol/L (ref 22–32)
Calcium: 8.5 mg/dL — ABNORMAL LOW (ref 8.9–10.3)
Chloride: 104 mmol/L (ref 98–111)
Creatinine, Ser: 2.23 mg/dL — ABNORMAL HIGH (ref 0.61–1.24)
GFR, Estimated: 32 mL/min — ABNORMAL LOW (ref >60)
Glucose, Bld: 205 mg/dL — ABNORMAL HIGH (ref 70–99)
Potassium: 5.6 mmol/L — ABNORMAL HIGH (ref 3.5–5.1)
Sodium: 135 mmol/L (ref 135–145)

## 2021-07-03 LAB — CBC
HCT: 30.3 % — ABNORMAL LOW (ref 39.0–52.0)
Hemoglobin: 9 g/dL — ABNORMAL LOW (ref 13.0–17.0)
MCH: 28.4 pg (ref 26.0–34.0)
MCHC: 29.7 g/dL — ABNORMAL LOW (ref 30.0–36.0)
MCV: 95.6 fL (ref 80.0–100.0)
Platelets: 305 10*3/uL (ref 150–400)
RBC: 3.17 MIL/uL — ABNORMAL LOW (ref 4.22–5.81)
RDW: 16.8 % — ABNORMAL HIGH (ref 11.5–15.5)
WBC: 7 10*3/uL (ref 4.0–10.5)
nRBC: 0 % (ref 0.0–0.2)

## 2021-07-03 LAB — PROTIME-INR
INR: 2.3 — ABNORMAL HIGH (ref 0.8–1.2)
Prothrombin Time: 25 seconds — ABNORMAL HIGH (ref 11.4–15.2)

## 2021-07-03 LAB — GLUCOSE, CAPILLARY
Glucose-Capillary: 208 mg/dL — ABNORMAL HIGH (ref 70–99)
Glucose-Capillary: 231 mg/dL — ABNORMAL HIGH (ref 70–99)
Glucose-Capillary: 181 mg/dL — ABNORMAL HIGH (ref 70–99)
Glucose-Capillary: 187 mg/dL — ABNORMAL HIGH (ref 70–99)

## 2021-07-03 MED ORDER — PERFLUTREN LIPID MICROSPHERE
1.0000 mL | INTRAVENOUS | Status: AC | PRN
  Administered 2021-07-03: 2 mL via INTRAVENOUS
  Filled 2021-07-03: qty 10

## 2021-07-03 MED ORDER — SODIUM CHLORIDE 0.9 % IV SOLN: INTRAVENOUS | Status: DC

## 2021-07-03 NOTE — Progress Notes (Signed)
2D echocardiogram with Definity completed.  07/03/2021 9:56 AM Kelby Aline., MHA, RVT, RDCS, RDMS

## 2021-07-03 NOTE — Progress Notes (Signed)
OT Cancellation Note  Patient Details Name: Joseph [name]. MRN: [id number] DOB: 1956-02-11   Cancelled Treatment:    Reason Eval/Treat Not Completed: Active bedrest order. Continues to have a bedrest order with no activity order or activity recommendations from ortho s/p surgery. Will continue to follow.  Alyiah Ulloa L Deeana Atwater 07/03/2021, 6:45 AM

## 2021-07-03 NOTE — Telephone Encounter (Signed)
-----   Message from Ledora Bottcher, [place] sent at 07/03/2021  1:10 PM EDT ----- Please mail zio in 3 weeks.  Dr. Harrington Challenger to read  Please schedule him and appt in 8 weeks with Dr. Harrington Challenger.  Thanks Angie

## 2021-07-03 NOTE — Progress Notes (Signed)
15ml output in woundvac my shift 10/7 (7a-7p)

## 2021-07-03 NOTE — Progress Notes (Signed)
Progress Note  Patient Name: Joseph [name]. Date of Encounter: 07/03/2021  [facility] Cardiologist  New    Subjective   Pt laying comfortable in bed   Wearing CPAP when sleep s    Inpatient Medications    Scheduled Meds:  (feeding supplement) PROSource Plus  30 mL Oral BID BM   amLODipine  10 mg Oral Daily   [START ON 07/06/2021] cephALEXin  250 mg Oral QHS   docusate sodium  100 mg Oral BID   feeding supplement (GLUCERNA SHAKE)  237 mL Oral Q24H   FLUoxetine  40 mg Oral QPM   insulin aspart  0-20 Units Subcutaneous TID WC   insulin aspart  0-5 Units Subcutaneous QHS   insulin aspart  3 Units Subcutaneous TID WC   insulin glargine-yfgn  55 Units Subcutaneous QHS   lisinopril  40 mg Oral Daily   methocarbamol  1,000 mg Oral BID   multivitamin with minerals  1 tablet Oral Daily   pantoprazole  40 mg Oral Daily   pioglitazone  45 mg Oral Daily   polyethylene glycol  17 g Oral BID   Ensure Max Protein  11 oz Oral Daily   senna  1 tablet Oral Daily   tamsulosin  0.4 mg Oral Daily   Continuous Infusions:   ceFAZolin (ANCEF) IV 2 g (07/03/21 0456)   heparin 2,500 Units/hr (07/03/21 0656)   methocarbamol (ROBAXIN) IV 500 mg (07/01/21 2138)   PRN Meds: acetaminophen **OR** acetaminophen, bisacodyl, HYDROmorphone (DILAUDID) injection, methocarbamol **OR** methocarbamol (ROBAXIN) IV, metoCLOPramide **OR** metoCLOPramide (REGLAN) injection, ondansetron **OR** ondansetron (ZOFRAN) IV, oxyCODONE **AND** oxyCODONE, perflutren lipid microspheres (DEFINITY) IV suspension, silver sulfADIAZINE   Vital Signs    Vitals:   07/02/21 1658 07/02/21 2028 07/03/21 0012 07/03/21 0457  BP: (!) 149/41 (!) 132/38 (!) 112/39 (!) 130/51  Pulse: 79 79 77 83  Resp: 16 16 20 20   Temp: 97.9 F (36.6 C) 98.3 F (36.8 C) 98.6 F (37 C) 98.7 F (37.1 C)  TempSrc:   Oral Oral  SpO2: 95% 98% 97% 96%  Weight:      Height:        Intake/Output Summary (Last 24 hours) at 07/03/2021 1141 Last  data filed at 07/03/2021 0656 Gross per 24 hour  Intake 1033.96 ml  Output 525 ml  Net 508.96 ml   Last 3 Weights 07/01/2021 06/25/2021 06/11/2021  Weight (lbs) 480 lb 9.6 oz 480 lb 9.6 oz 460 lb  Weight (kg) 218 kg 218 kg 208.655 kg      Telemetry    SR   PVCs  - Personally Reviewed  ECG     - Personally Reviewed  Physical Exam   GEN:   Morbidly obese   No acute distress.   Neck: Neck is full   Cardiac: RRR, no murmurs, Respiratory: Clear to auscultation bilaterally. GI: Soft, nontender, non-distended  MS: No edema; No deformity. Neuro:  Nonfocal  Psych: Normal affect   Labs    High Sensitivity Troponin:  No results for input(s): TROPONINIHS in the last 720 hours.   Chemistry Recent Labs  Lab 07/01/21 0446  NA 135  K 5.1  CL 102  CO2 27  GLUCOSE 157*  BUN 42*  CREATININE 1.17  CALCIUM 8.5*  GFRNONAA >60  ANIONGAP 6    Lipids No results for input(s): CHOL, TRIG, HDL, LABVLDL, LDLCALC, CHOLHDL in the last 168 hours.  Hematology Recent Labs  Lab 07/01/21 0446 07/02/21 0504 07/03/21 0007  WBC  6.5 8.9 7.0  RBC 3.84* 3.70* 3.17*  HGB 11.0* 10.6* 9.0*  HCT 36.4* 35.8* 30.3*  MCV 94.8 96.8 95.6  MCH 28.6 28.6 28.4  MCHC 30.2 29.6* 29.7*  RDW 17.2* 17.0* 16.8*  PLT 396 344 305   Thyroid No results for input(s): TSH, FREET4 in the last 168 hours.  BNPNo results for input(s): BNP, PROBNP in the last 168 hours.  DDimer No results for input(s): DDIMER in the last 168 hours.   Radiology    DG Knee 1-2 Views Left  Result Date: 07/01/2021 CLINICAL DATA:  Intraoperative repair EXAM: LEFT KNEE - 1-2 VIEW COMPARISON:  06/15/2021 FINDINGS: Ten low resolution intraoperative spot views of the left knee. Total fluoroscopy time was 26 seconds. Images were obtained during operative placement of external fixation device. IMPRESSION: Intraoperative fluoroscopic assistance provided during knee surgery Electronically Signed   By: Donavan Foil M.D.   On: 07/01/2021 21:32    DG C-Arm 1-60 Min-No Report  Result Date: 07/01/2021 Fluoroscopy was utilized by the requesting physician.  No radiographic interpretation.   ECHOCARDIOGRAM COMPLETE  Result Date: 07/03/2021    ECHOCARDIOGRAM REPORT   Patient Name:   Joseph [name]. Date of Exam: 07/03/2021 Medical Rec #:  [id number]        Height:       73.0 in Accession #:    [id number]       Weight:       480.6 lb Date of Birth:  [birth year]        BSA:          3.123 m Patient Age:    65 years         BP:           130/51 mmHg Patient Gender: M                HR:           39 bpm. Exam Location:  Inpatient Procedure: 2D Echo, Color Doppler, Cardiac Doppler and Intracardiac            Opacification Agent Indications:    Bradycardia  History:        Patient has prior history of Echocardiogram examinations, most                 recent 01/03/2003. Risk Factors:Hypertension, Diabetes and                 Dyslipidemia.  Sonographer:    Maudry Mayhew MHA, RDMS, RVT, RDCS Referring Phys: [id number] Jissell Trafton V Rachana Malesky  Sonographer Comments: Technically challenging study due to limited acoustic windows, suboptimal parasternal window, suboptimal apical window, suboptimal subcostal window and patient is morbidly obese. IMPRESSIONS  1. Left ventricular ejection fraction, by estimation, is 60 to 65%. The left ventricle has normal function. Left ventricular endocardial border not optimally defined to evaluate regional wall motion. Left ventricular diastolic parameters are indeterminate.  2. Right ventricular systolic function is normal. The right ventricular size is normal.  3. The mitral valve was not well visualized. No evidence of mitral valve regurgitation. No evidence of mitral stenosis.  4. The aortic valve was not well visualized. Aortic valve regurgitation is not visualized. No aortic stenosis is present.  5. The inferior vena cava is normal in size with greater than 50% respiratory variability, suggesting right atrial pressure of 3 mmHg. FINDINGS  Left  Ventricle: Left ventricular ejection fraction, by estimation, is 60 to 65%. The left ventricle has normal function. Left ventricular  endocardial border not optimally defined to evaluate regional wall motion. The left ventricular internal cavity size was normal in size. There is no left ventricular hypertrophy. Left ventricular diastolic parameters are indeterminate. Right Ventricle: The right ventricular size is normal. No increase in right ventricular wall thickness. Right ventricular systolic function is normal. Left Atrium: Left atrial size was normal in size. Right Atrium: Right atrial size was normal in size. Pericardium: There is no evidence of pericardial effusion. Presence of pericardial fat pad. Mitral Valve: The mitral valve was not well visualized. Mild mitral annular calcification. No evidence of mitral valve regurgitation. No evidence of mitral valve stenosis. Tricuspid Valve: The tricuspid valve is not well visualized. Tricuspid valve regurgitation is not demonstrated. No evidence of tricuspid stenosis. Aortic Valve: The aortic valve was not well visualized. Aortic valve regurgitation is not visualized. No aortic stenosis is present. Aortic valve mean gradient measures 2.0 mmHg. Aortic valve peak gradient measures 3.8 mmHg. Aortic valve area, by VTI measures 2.83 cm. Pulmonic Valve: The pulmonic valve was normal in structure. Pulmonic valve regurgitation is not visualized. No evidence of pulmonic stenosis. Aorta: The aortic root is normal in size and structure. Venous: The inferior vena cava is normal in size with greater than 50% respiratory variability, suggesting right atrial pressure of 3 mmHg. IAS/Shunts: No atrial level shunt detected by color flow Doppler.  LEFT VENTRICLE PLAX 2D LVOT diam:     1.90 cm LV SV:         55 LV SV Index:   18 LVOT Area:     2.84 cm  RIGHT VENTRICLE RV S prime:     12.80 cm/s LEFT ATRIUM           Index LA Vol (A4C): 22.7 ml 7.27 ml/m  AORTIC VALVE AV Area (Vmax):     2.89 cm AV Area (Vmean):   2.85 cm AV Area (VTI):     2.83 cm AV Vmax:           97.40 cm/s AV Vmean:          71.400 cm/s AV VTI:            0.195 m AV Peak Grad:      3.8 mmHg AV Mean Grad:      2.0 mmHg LVOT Vmax:         99.35 cm/s LVOT Vmean:        71.800 cm/s LVOT VTI:          0.194 m LVOT/AV VTI ratio: 1.00  AORTA Ao Root diam: 2.70 cm MR Peak grad: 9.1 mmHg MR Vmax:      151.00 cm/s SHUNTS                           Systemic VTI:  0.19 m                           Systemic Diam: 1.90 cm Candee Furbish MD Electronically signed by Candee Furbish MD Signature Date/Time: 07/03/2021/11:03:35 AM    Final     Cardiac Studies   Echo 10/7 eft ventricular ejection fraction, by estimation, is 60 to 65%. The left ventricle has normal function. Left ventricular endocardial border not optimally defined to evaluate regional wall motion. Left ventricular diastolic parameters are indeterminate. 1. 2. Right ventricular systolic function is normal. The right ventricular size is normal. The mitral valve was not well visualized. No evidence  of mitral valve regurgitation. No evidence of mitral stenosis. 3. The aortic valve was not well visualized. Aortic valve regurgitation is not visualized. No aortic stenosis is present. 4. The inferior vena cava is normal in size with greater than 50% respiratory variability, suggesting right atrial pressure of 3 mmHg.  Patient Profile     65 y.o. 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

## 2021-07-03 NOTE — TOC Progression Note (Signed)
Transition of Care (TOC) - Progression Note    Patient Details  Name: Joseph [name]. MRN: [id number] Date of Birth: 15-Jul-1956  Transition of Care [facility]) CM/SW Contact  Morton Simson, Juliann Pulse, RN Phone Number: 07/03/2021, 11:18 AM  Clinical Narrative: For transfer to [facility] today.      Expected Discharge Plan: Skilled Nursing Facility Barriers to Discharge: Continued Medical Work up  Expected Discharge Plan and Services Expected Discharge Plan: [place]                                               Social Determinants of Health (SDOH) Interventions    Readmission Risk Interventions No flowsheet data found.

## 2021-07-03 NOTE — Progress Notes (Addendum)
  ANTICOAGULATION CONSULT NOTE - Follow Up Consult  Pharmacy Consult for Heparin Indication: h/o pulmonary embolus  Allergies  Allergen Reactions   Tape     Peels skin on legs   Chlorhexidine Rash    Wipes    Patient Measurements: Height: 6\' 1"  (185.4 cm) Weight: (!) 218 kg (480 lb 9.6 oz) IBW/kg (Calculated) : 79.9 Heparin Dosing Weight: 132.5 kg  Vital Signs: Temp: 98.7 F (37.1 C) (10/07 0457) Temp Source: Oral (10/07 0457) BP: 130/51 (10/07 0457) Pulse Rate: 83 (10/07 0457)  Labs: Recent Labs    07/01/21 0446 07/02/21 0504 07/02/21 1546 07/03/21 0007 07/03/21 0546  HGB 11.0* 10.6*  --  9.0*  --   HCT 36.4* 35.8*  --  30.3*  --   PLT 396 344  --  305  --   LABPROT 20.3* 19.8*  --  25.0*  --   INR 1.7* 1.7*  --  2.3*  --   HEPARINUNFRC  --   --  0.28* 0.49 0.59  CREATININE 1.17  --   --   --   --      Estimated Creatinine Clearance: 121.9 mL/min (by C-G formula based on SCr of 1.17 mg/dL).   Medications:  PTA Warfarin:  5 mg on Sun/Mon/Wed/Fri evening, and 7.5 mg on Tue/Thur/Sat evening.   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 At admission, warfarin was held, vitamin K was given for supratherapeutic INR, and pt was transfused.  Most recent pharmacy consult to begin heparin when INR decreased to < 2 while held for surgery. Heparin IV began on 10/5 with an INR of 1.7.   Significant Events: Warfarin held 9/15-9/22 Vitamin K given 9/15-9/17 Warfarin resumed 9/23 with heparin bridge (completed 9/30) Warfarin held 10/3 Heparin started 10/5 for INR < 2, held 10/5 PM for surgery, restarted 10/6 AM  Today, 07/03/21 INR increased to 2.3, warfarin held since 10/3 HL therapeutic at 0.59 on 2500 units/hr CBC: Hgb low and decreased to 9.0, Plt wnl  No postop bleeding reported.  Wound VAC in place. No complications per RN.   Goal of Therapy:  Heparin level 0.3-0.7 units/ml Monitor platelets by anticoagulation protocol: Yes   Plan:  Continue Heparin infusion at 2500 units/hr Daily heparin level and CBC Monitor for s/sx bleeding Follow up plans for further surgery next week with Dr. Alita Chyle PharmD/MBA Candidate [facility], Class of 2023

## 2021-07-03 NOTE — Progress Notes (Unsigned)
Enrolled patient for a 14 day Zio XT monitor to be mailed to patients home on 10/27   Dr Harrington Challenger to read

## 2021-07-03 NOTE — Telephone Encounter (Signed)
Zio patch has been ordered. Called patient and left message for patient to call back so  we can schedule him an office visit with Dr. Harrington Challenger around July 29, 2021.

## 2021-07-03 NOTE — Significant Event (Signed)
Pt c/o pain in the left foot/leg that RN notices is different from when RN last had patient on 9/28.  Prior to Pt surgery pain was in knee only.  Pt no describes his pain as being in the mid calf to ankle area and its like a stabbing pain.  However during bedside report RN touched outer part of patients left foot and he didn't even know I was touching his foot.  Per patient wife he has neuropathy but pt is not on lyrica or gabapentin, may benefit from those medications?

## 2021-07-03 NOTE — Progress Notes (Signed)
PROGRESS NOTE  Joseph [name].  DOB: 03/21/1956  PCP: Donald Prose, MD OZD:[id number]  DOA: 06/11/2021  LOS: 21 days  Hospital Day: 23   Chief Complaint  Patient presents with   Leg Swelling   Leg Pain    left    Brief narrative: Joseph Senn. is a 65 y.o. male with PMH significant for morbid obesity, OSA on CPAP, DM2, HTN, HLD, PAD, DVT/PE on Coumadin, GERD, anxiety/depression, polyneuropathy, multiple falls who was living at home. Patient presented to the ED on 9/15 after fall at home resulting in left knee pain.  He was found to have left knee dislocation and fracture fragments with associated left knee effusion with concern for possible hematoma in setting of supratherapeutic INR.  Admitted to hospitalist service Orthopedic surgery was consulted. Patient underwent closed reduction of left knee dislocation but no surgical management was planned because of patient's body habitus. See below for details.  Subjective: Patient was seen and examined this morning.   Morbidly obese.  Lying on bed.  Not in distress.  No neurodeficits.  Family at bedside. Pending transfer to [facility].  Assessment/Plan: Left knee dislocation status post reduction with recurrent subluxation Left knee hematoma -Presented with fall.  X-ray showed left knee dislocation with several fracture fragments and hematoma. -Orthopedic surgery was consulted. Patient underwent and reduced patient's knee with recommendations for nonweightbearing status of left leg in addition to strict use of an immobilizer and no ROM.  -Because of patient's heavy body habitus, surgical fixation was not recommended at the time. -However, MRI obtained on 9/30 showed evidence of significant ligamentous injury in addition to lateral patellar dislocation.  Orthopedics team revisited the decision and made a plan of surgical fixation.  -On 10/5, patient underwent left knee extensive debridement, reduction of knee dislocation, external  fixation spanning of knee dislocation and a wound VAC application. -Per orthopedics Dr. Randel Pigg recommendation we will transfer the patient to [facility] to be seen by Dr. Sharol Given for further evaluation management  History of pulm embolism -Patient was on Coumadin as an outpatient.  Currently on heparin drip.  AKI on CKD stage IIIb -Creatinine peaked at 2.87 probably related to hypoperfusion from blood loss, improved down to normal subsequently. Recent Labs    06/14/21 0330 06/15/21 0256 06/16/21 0339 06/17/21 0325 06/18/21 0340 06/19/21 0334 06/20/21 0014 06/23/21 0411 06/24/21 0507 07/01/21 0446  BUN 66* 74* 70* 70* 69* 60* 50* 40* 37* 42*  CREATININE 2.87* 2.57* 1.93* 1.65* 1.52* 1.35* 1.32* 1.26* 1.24 1.17   Type 2 diabetes mellitus Diabetic neuropathy -A1c 6.1 on 9/22 -Currently on Actos, Semglee 50 units nightly with NovoLog 3 units 3 times daily and sliding cell insulin.  Metformin on hold. -Blood sugar level continues to remain elevated over 200.  I increased the dose of his Semglee from 15 units to 60 units last night.  We will continue the same for now.  If continues to remain elevated again, need further escalation in dose. Recent Labs  Lab 07/02/21 1147 07/02/21 1654 07/02/21 2024 07/03/21 0733 07/03/21 1131  GLUCAP 171* 199* 202* 208* 231*   Essential hypertension -Currently controlled on amlodipine and lisinopril.  Hyperlipidemia -Pravastatin on hold.  Chronic anemia -Baseline hemoglobin over 10.  In this admission, patient received a total of 2 units of PRBC and 1 unit of FFP.  Hemoglobin this morning 9.  Probably postop drop. Recent Labs    06/29/21 0435 06/30/21 0451 07/01/21 0446 07/02/21 0504 07/03/21 0007  HGB  10.5* 10.9* 11.0* 10.6* 9.0*  MCV 94.6 95.2 94.8 96.8 95.6   Bradycardia 1st degree heart block -Recurrent. Asymptomatic. In setting of known sleep apnea but occurs while awake as well. EKG significant for 1st degree AV block which does  not appear to be new. Telemetry with heart rate down to high 30s while awake -Continue telemetry -Per cardiology, patient will get Zio patch at discharge.  Morbid obesity  -Body mass index is 63.41 kg/m. Patient has been advised to make an attempt to improve diet and exercise patterns to aid in weight loss.  GERD -Continue Protonix   OSA -Continue CPAP qHS   Constipation -in setting of opiate use. -Continue Colace, Senokot, Miralax -Dulcolax suppository prn   Pressure injury -Mid-nose, not present on admission. Secondary to CPAP mask   Mobility: PT eval postprocedure Code Status:   Code Status: Full Code  Nutritional status: Body mass index is 63.41 kg/m. Nutrition Problem: Inadequate oral intake Etiology: decreased appetite Signs/Symptoms: per patient/family report Diet:  Diet Order             Diet Carb Modified Fluid consistency: Thin; Room service appropriate? Yes  Diet effective now                  DVT prophylaxis:  SCDs Start: 07/01/21 2219 SCDs Start: 06/11/21 2153   Antimicrobials: Not on scheduled antibiotics Fluid: None Consultants: Orthopedics Family Communication: Family at bedside  Status is: Inpatient  Remains inpatient appropriate because: Plan for revision of surgery next week  Dispo: The patient is from: Home              Anticipated d/c is to: SNF likely              Patient currently is not medically stable to d/c.   Difficult to place patient No     Infusions:    ceFAZolin (ANCEF) IV 2 g (07/03/21 0456)   heparin 2,500 Units/hr (07/03/21 0656)   methocarbamol (ROBAXIN) IV 500 mg (07/01/21 2138)    Scheduled Meds:  (feeding supplement) PROSource Plus  30 mL Oral BID BM   amLODipine  10 mg Oral Daily   [START ON 07/06/2021] cephALEXin  250 mg Oral QHS   docusate sodium  100 mg Oral BID   feeding supplement (GLUCERNA SHAKE)  237 mL Oral Q24H   FLUoxetine  40 mg Oral QPM   insulin aspart  0-20 Units Subcutaneous TID WC    insulin aspart  0-5 Units Subcutaneous QHS   insulin aspart  3 Units Subcutaneous TID WC   insulin glargine-yfgn  55 Units Subcutaneous QHS   lisinopril  40 mg Oral Daily   methocarbamol  1,000 mg Oral BID   multivitamin with minerals  1 tablet Oral Daily   pantoprazole  40 mg Oral Daily   pioglitazone  45 mg Oral Daily   polyethylene glycol  17 g Oral BID   Ensure Max Protein  11 oz Oral Daily   senna  1 tablet Oral Daily   tamsulosin  0.4 mg Oral Daily    Antimicrobials: Anti-infectives (From admission, onward)    Start     Dose/Rate Route Frequency Ordered Stop   07/06/21 2200  cephALEXin (KEFLEX) capsule 250 mg        250 mg Oral Daily at bedtime 07/02/21 1232     07/02/21 0600  ceFAZolin (ANCEF) IVPB 3g/100 mL premix  Status:  Discontinued        3 g 200 mL/hr over  30 Minutes Intravenous On call to O.R. 07/01/21 1458 07/01/21 1502   07/02/21 0300  ceFAZolin (ANCEF) IVPB 2g/100 mL premix        2 g 200 mL/hr over 30 Minutes Intravenous Every 8 hours 07/01/21 2218 07/06/21 0559   07/01/21 1600  ceFAZolin (ANCEF) IVPB 3g/100 mL premix        3 g 200 mL/hr over 30 Minutes Intravenous On call to O.R. 07/01/21 1502 07/01/21 1850   06/11/21 2230  cephALEXin (KEFLEX) capsule 250 mg  Status:  Discontinued        250 mg Oral Daily at bedtime 06/11/21 2221 07/02/21 1232       PRN meds: acetaminophen **OR** acetaminophen, bisacodyl, HYDROmorphone (DILAUDID) injection, methocarbamol **OR** methocarbamol (ROBAXIN) IV, metoCLOPramide **OR** metoCLOPramide (REGLAN) injection, ondansetron **OR** ondansetron (ZOFRAN) IV, oxyCODONE **AND** oxyCODONE, silver sulfADIAZINE   Objective: Vitals:   07/03/21 0012 07/03/21 0457  BP: (!) 112/39 (!) 130/51  Pulse: 77 83  Resp: 20 20  Temp: 98.6 F (37 C) 98.7 F (37.1 C)  SpO2: 97% 96%    Intake/Output Summary (Last 24 hours) at 07/03/2021 1331 Last data filed at 07/03/2021 0656 Gross per 24 hour  Intake 1033.96 ml  Output 525 ml  Net  508.96 ml   Filed Weights   06/11/21 1326 06/25/21 0900 07/01/21 1518  Weight: (!) 208.7 kg (!) 218 kg (!) 218 kg   Weight change:  Body mass index is 63.41 kg/m.   Physical Exam: General exam: Pleasant elderly, morbidly obese Caucasian male. Skin: No rashes, lesions or ulcers. HEENT: Atraumatic, normocephalic, no obvious bleeding Lungs: Clear to auscultation bilaterally CVS: Regular rate and rhythm, no murmur GI/Abd soft, distended from obesity, nontender, bowel sound present CNS: Alert, awake, oriented x3 Psychiatry: Mood appropriate Extremities: Left leg in external fixator  Data Review: I have personally reviewed the laboratory data and studies available.  Recent Labs  Lab 06/29/21 0435 06/30/21 0451 07/01/21 0446 07/02/21 0504 07/03/21 0007  WBC 5.7 5.6 6.5 8.9 7.0  HGB 10.5* 10.9* 11.0* 10.6* 9.0*  HCT 34.7* 35.8* 36.4* 35.8* 30.3*  MCV 94.6 95.2 94.8 96.8 95.6  PLT 430* 413* 396 344 305   Recent Labs  Lab 07/01/21 0446  NA 135  K 5.1  CL 102  CO2 27  GLUCOSE 157*  BUN 42*  CREATININE 1.17  CALCIUM 8.5*    F/u labs ordered Unresulted Labs (From admission, onward)     Start     Ordered   06/30/21 0500  CBC  Daily,   R     Question:  Specimen collection method  Answer:  Lab=Lab collect   06/28/21 1723   06/21/21 0700  Protime-INR  Daily,   R     Question:  Specimen collection method  Answer:  Lab=Lab collect   06/20/21 0139            Signed, Joseph Croak, MD [facility] 07/03/2021

## 2021-07-03 NOTE — Progress Notes (Signed)
ANTICOAGULATION CONSULT NOTE - Follow Up Consult  Pharmacy Consult for Heparin Indication: h/o pulmonary embolus  Allergies  Allergen Reactions   Tape     Peels skin on legs   Chlorhexidine Rash    Wipes    Patient Measurements: Height: 6\' 1"  (185.4 cm) Weight: (!) 218 kg (480 lb 9.6 oz) IBW/kg (Calculated) : 79.9 Heparin Dosing Weight: 132.5 kg  Vital Signs: Temp: 98.6 F (37 C) (10/07 0012) Temp Source: Oral (10/07 0012) BP: 112/39 (10/07 0012) Pulse Rate: 77 (10/07 0012)  Labs: Recent Labs    06/30/21 0451 07/01/21 0446 07/02/21 0504 07/02/21 1546 07/03/21 0007  HGB 10.9* 11.0* 10.6*  --  9.0*  HCT 35.8* 36.4* 35.8*  --  30.3*  PLT 413* 396 344  --  305  LABPROT 24.3* 20.3* 19.8*  --   --   INR 2.2* 1.7* 1.7*  --   --   HEPARINUNFRC  --   --   --  0.28* 0.49  CREATININE  --  1.17  --   --   --      Estimated Creatinine Clearance: 121.9 mL/min (by C-G formula based on SCr of 1.17 mg/dL).   Medications:  PTA Warfarin:  5 mg on Sun/Mon/Wed/Fri evening, and 7.5 mg on Tue/Thur/Sat evening.   INR was supratherapeutic (6.2) at admission  Assessment: 28 YOM on lifelong warfarin therapy for hx of PE, body habitus, and sedentary lifestyle admitted on 9/15 with left knee dislocation and effusion concerning for hematoma. At admission, warfarin was held, vitamin K was given for supratherapeutic INR, and pt was transfused.  Most recent pharmacy consult to begin heparin when INR decreased to < 2 while held for surgery.  Heparin IV began on 10/5 with an INR of 1.7.   Significant Events: Warfarin held 9/15-9/22 Vitamin K given 9/15-9/17 Warfarin resumed 9/23 with heparin bridge (completed 9/30) Warfarin held 10/3 Heparin started 10/5 for INR < 2, held 10/5 PM for surgery, restarted 10/6 AM  HL 0.49 therapeutic on 2500 units/hr No bleeding noted   Goal of Therapy:  Heparin level 0.3-0.7 units/ml Monitor platelets by anticoagulation protocol: Yes   Plan:   Continue heparin drip at 2500 units/hr Confirmatory heparin level in 6 hours Daily CBC   Dolly Rias RPh 07/03/2021, 12:51 AM

## 2021-07-03 NOTE — Plan of Care (Signed)
°  Problem: Nutrition: °Goal: Adequate nutrition will be maintained °Outcome: Progressing °  °Problem: Coping: °Goal: Level of anxiety will decrease °Outcome: Progressing °  °Problem: Safety: °Goal: Ability to remain free from injury will improve °Outcome: Progressing °  °

## 2021-07-04 DIAGNOSIS — S83105A Unspecified dislocation of left knee, initial encounter: Secondary | ICD-10-CM | POA: Diagnosis not present

## 2021-07-04 LAB — CBC
HCT: 27.1 % — ABNORMAL LOW (ref 39.0–52.0)
Hemoglobin: 8.1 g/dL — ABNORMAL LOW (ref 13.0–17.0)
MCH: 28.1 pg (ref 26.0–34.0)
MCHC: 29.9 g/dL — ABNORMAL LOW (ref 30.0–36.0)
MCV: 94.1 fL (ref 80.0–100.0)
Platelets: 294 10*3/uL (ref 150–400)
RBC: 2.88 MIL/uL — ABNORMAL LOW (ref 4.22–5.81)
RDW: 16.6 % — ABNORMAL HIGH (ref 11.5–15.5)
WBC: 6.8 10*3/uL (ref 4.0–10.5)
nRBC: 0 % (ref 0.0–0.2)

## 2021-07-04 LAB — HEPARIN LEVEL (UNFRACTIONATED)
Heparin Unfractionated: 0.29 IU/mL — ABNORMAL LOW (ref 0.30–0.70)
Heparin Unfractionated: 0.42 IU/mL (ref 0.30–0.70)

## 2021-07-04 LAB — BASIC METABOLIC PANEL
Anion gap: 5 (ref 5–15)
BUN: 63 mg/dL — ABNORMAL HIGH (ref 8–23)
CO2: 24 mmol/L (ref 22–32)
Calcium: 8 mg/dL — ABNORMAL LOW (ref 8.9–10.3)
Chloride: 102 mmol/L (ref 98–111)
Creatinine, Ser: 2.37 mg/dL — ABNORMAL HIGH (ref 0.61–1.24)
GFR, Estimated: 30 mL/min — ABNORMAL LOW (ref >60)
Glucose, Bld: 224 mg/dL — ABNORMAL HIGH (ref 70–99)
Potassium: 5.1 mmol/L (ref 3.5–5.1)
Sodium: 131 mmol/L — ABNORMAL LOW (ref 135–145)

## 2021-07-04 LAB — GLUCOSE, CAPILLARY
Glucose-Capillary: 204 mg/dL — ABNORMAL HIGH (ref 70–99)
Glucose-Capillary: 205 mg/dL — ABNORMAL HIGH (ref 70–99)
Glucose-Capillary: 193 mg/dL — ABNORMAL HIGH (ref 70–99)
Glucose-Capillary: 176 mg/dL — ABNORMAL HIGH (ref 70–99)
Glucose-Capillary: 165 mg/dL — ABNORMAL HIGH (ref 70–99)

## 2021-07-04 LAB — PROTIME-INR
INR: 1.8 — ABNORMAL HIGH (ref 0.8–1.2)
Prothrombin Time: 21.2 seconds — ABNORMAL HIGH (ref 11.4–15.2)

## 2021-07-04 NOTE — Progress Notes (Addendum)
Patient resting Wound VAC functional Plan for return to the OR with Dr. Sharol Given on Wednesday for management of complex distal femoral wound which connects to the joint Reason for nonoperative management of the knee dislocation relates more to the current infection below the hematoma as well as body habitus Gram-positive cocci on culture reintubated for better growth

## 2021-07-04 NOTE — Progress Notes (Signed)
PROGRESS NOTE  Joseph [name]. HUD:[id number] DOB: 1955/12/20 DOA: 06/11/2021 PCP: Donald Prose, MD  HPI/Recap of past 24 hours:  Joseph [name]. is a 65 y.o. male with PMH significant for morbid obesity, OSA on CPAP, DM2, HTN, HLD, PAD, DVT on Coumadin, GERD, anxiety/depression, polyneuropathy, multiple falls who was living at home. Patient presented to the ED on 9/15 after fall at home resulting in left knee pain.  He was found to have left knee dislocation and fracture fragments with associated left knee effusion with concern for possible hematoma in setting of supratherapeutic INR.  Admitted to hospitalist service Orthopedic surgery was consulted. Patient underwent closed reduction of left knee dislocation but no surgical management was planned because of patient's body habitus.  07/04/2021: Patient was seen and examined at his bedside.  He was using his CPAP.  He has no new complaints.  Assessment/Plan: Principal Problem:   Left knee dislocation, initial encounter Active Problems:   Pure hypercholesterolemia   Essential hypertension, benign   OSA (obstructive sleep apnea)   Hx pulmonary embolism   Esophageal reflux   Diabetes mellitus type 2, uncontrolled   Diabetic neuropathy (HCC)   Acute renal failure superimposed on stage 3b chronic kidney disease (HCC)   Normocytic anemia   Class 3 obesity (HCC)   Aortic atherosclerosis (HCC)   Coronary atherosclerosis   Effusion of left knee   Left knee dislocation   Pressure injury of skin   Unstageable pressure ulcer of sacral region ([place])  Left knee dislocation status post reduction with recurrent subluxation Left knee hematoma -Presented with fall.  X-ray showed left knee dislocation with several fracture fragments and hematoma. -Seen by orthopedic surgery, patient's knee reduced with recommendations for nonweightbearing status of left lower extremity, strict use of an immobilizer and no range of motion..  -Because of patient's  heavy body habitus, surgical fixation was not recommended at the time. -However, MRI obtained on 9/30 showed evidence of significant ligamentous injury in addition to lateral patellar dislocation.  Orthopedics team revisited the decision and made a plan of surgical fixation.  -On 10/5, patient underwent left knee extensive debridement, reduction of knee dislocation, external fixation spanning of knee dislocation and a wound VAC application. -Orthopedic surgery Dr. Marlou Sa recommended transfer to [facility] to be seen by Dr. Sharol Given for further evaluation and management.     Worsening AKI Baseline creatinine appears to be 1.2 with GFR greater than 60. Creatinine uptrending 2.37 with GFR of 30 Avoid nephrotoxic agents, dehydration and hypotension Monitor urine output Repeat BMP in the morning  History of pulm embolism on Coumadin INR 1.8 Coumadin is on hold, he is currently on heparin drip. Restart Coumadin when no further procedures are planned.  Type 2 diabetes mellitus Diabetic neuropathy -A1c 6.1 on 9/22 -Currently on Actos, Semglee 50 units nightly with NovoLog 3 units 3 times daily and sliding cell insulin.  Metformin on hold due to renal insufficiency. -Blood sugar level continues to remain elevated over 200.  Dose of Semglee increased from 15 units to 60 units.  We will continue the same for now.  If continues to remain elevated again, need further escalation in dose. Diabetes coordinator   Essential hypertension BP is currently at goal Continue on amlodipine and lisinopril.   Hyperlipidemia -Pravastatin on hold.   Chronic normocytic anemia -Baseline hemoglobin over 10.  In this admission, patient received a total of 2 units of PRBC and 1 unit of FFP.  Hemoglobin this morning 8.1.  Contributed by surgical blood loss. Continue to monitor H&H, transfuse when indicated.   Resolved bradycardia  1st degree heart block Asymptomatic. In setting of known sleep apnea but occurs while  awake as well. EKG significant for 1st degree AV block which does not appear to be new. Telemetry with heart rate down to high 30s while awake -Continue telemetry -Per cardiology, patient will get Zio patch at discharge.  Resolving hyperkalemia likely secondary to renal insufficiency. Serum potassium 5.1 from 5.6. Continue to closely monitor and treat as indicated.   Severe morbid obesity  -Body mass index is 63.41 kg/m. Patient has been advised to make an attempt to improve diet and exercise patterns to aid in weight loss.   GERD -Continue Protonix   OSA, compliant with CPAP Has been compliant with CPAP, encouraged to continue. -Continue CPAP qHS   Constipation, opiate induced. -in setting of opiate use. -Continue Colace, Senokot, Miralax -Dulcolax suppository prn   Pressure injury -Mid-nose, not present on admission. Secondary to CPAP mask   Mobility: PT eval postprocedure with orthopedic surgery's guidance. Code Status:   Code Status: Full Code  Nutritional status: Body mass index is 63.41 kg/m. Nutrition Problem: Inadequate oral intake Etiology: decreased appetite Signs/Symptoms: per patient/family report Diet:  Diet Order                  Diet Carb Modified Fluid consistency: Thin; Room service appropriate? Yes  Diet effective now                       DVT prophylaxis:  Heparin drip.   Antimicrobials: Not on scheduled antibiotics Fluid: None Consultants: Orthopedics Family Communication: None at bedside.   Status is: Inpatient   Remains inpatient appropriate because: Plan for revision of surgery next week   Dispo: The patient is from: Home              Anticipated d/c is to: SNF likely              Patient currently is not medically stable to d/c.              Difficult to place patient No     Objective: Vitals:   07/03/21 1958 07/04/21 0053 07/04/21 0743 07/04/21 0830  BP: (!) 121/36 (!) 104/34 (!) 134/41 126/84  Pulse: 81 81 75 (!) 102   Resp: 15 16 17 15   Temp: 99.8 F (37.7 C) 98.7 F (37.1 C) 97.8 F (36.6 C) 98.6 F (37 C)  TempSrc: Oral Oral Oral Oral  SpO2: 96% 97% 97% 98%  Weight:      Height:        Intake/Output Summary (Last 24 hours) at 07/04/2021 1004 Last data filed at 07/04/2021 9892 Gross per 24 hour  Intake 2350.43 ml  Output 450 ml  Net 1900.43 ml   Filed Weights   06/11/21 1326 06/25/21 0900 07/01/21 1518  Weight: (!) 208.7 kg (!) 218 kg (!) 218 kg    Exam:  General: 65 y.o. year-old male severely morbidly obese in no acute stress.  He is alert and with x3.. Cardiovascular: Regular rate and rhythm no rubs or gallops.  Respiratory: Clear to rotation with no wheezes or rales.  Abdomen: Morbidly obese soft nontender bowel sounds present.  Musculoskeletal: Trace lower extremity edema bilaterally.   Skin: No ulcerative lesions noted.   Psychiatry: Mood is appropriate for condition and setting.   Data Reviewed: CBC: Recent Labs  Lab 06/30/21 0451 07/01/21  2706 07/02/21 0504 07/03/21 0007 07/04/21 0118  WBC 5.6 6.5 8.9 7.0 6.8  HGB 10.9* 11.0* 10.6* 9.0* 8.1*  HCT 35.8* 36.4* 35.8* 30.3* 27.1*  MCV 95.2 94.8 96.8 95.6 94.1  PLT 413* 396 344 305 237   Basic Metabolic Panel: Recent Labs  Lab 07/01/21 0446 07/03/21 1827 07/04/21 0118  NA 135 135 131*  K 5.1 5.6* 5.1  CL 102 104 102  CO2 27 23 24   GLUCOSE 157* 205* 224*  BUN 42* 63* 63*  CREATININE 1.17 2.23* 2.37*  CALCIUM 8.5* 8.5* 8.0*   GFR: Estimated Creatinine Clearance: 60.2 mL/min (A) (by C-G formula based on SCr of 2.37 mg/dL (H)). Liver Function Tests: No results for input(s): AST, ALT, ALKPHOS, BILITOT, PROT, ALBUMIN in the last 168 hours. No results for input(s): LIPASE, AMYLASE in the last 168 hours. No results for input(s): AMMONIA in the last 168 hours. Coagulation Profile: Recent Labs  Lab 06/30/21 0451 07/01/21 0446 07/02/21 0504 07/03/21 0007 07/04/21 0118  INR 2.2* 1.7* 1.7* 2.3* 1.8*   Cardiac  Enzymes: No results for input(s): CKTOTAL, CKMB, CKMBINDEX, TROPONINI in the last 168 hours. BNP (last 3 results) No results for input(s): PROBNP in the last 8760 hours. HbA1C: No results for input(s): HGBA1C in the last 72 hours. CBG: Recent Labs  Lab 07/03/21 1131 07/03/21 1728 07/03/21 1956 07/04/21 0115 07/04/21 0829  GLUCAP 231* 181* 187* 204* 205*   Lipid Profile: No results for input(s): CHOL, HDL, LDLCALC, TRIG, CHOLHDL, LDLDIRECT in the last 72 hours. Thyroid Function Tests: No results for input(s): TSH, T4TOTAL, FREET4, T3FREE, THYROIDAB in the last 72 hours. Anemia Panel: No results for input(s): VITAMINB12, FOLATE, FERRITIN, TIBC, IRON, RETICCTPCT in the last 72 hours. Urine analysis:    Component Value Date/Time   COLORURINE YELLOW 04/03/2020 0228   APPEARANCEUR CLEAR 04/03/2020 0228   LABSPEC 1.018 04/03/2020 0228   PHURINE 5.0 04/03/2020 0228   GLUCOSEU NEGATIVE 04/03/2020 0228   HGBUR NEGATIVE 04/03/2020 0228   BILIRUBINUR NEGATIVE 04/03/2020 0228   KETONESUR 5 (A) 04/03/2020 0228   PROTEINUR NEGATIVE 04/03/2020 0228   UROBILINOGEN 0.2 12/30/2014 2032   NITRITE NEGATIVE 04/03/2020 0228   LEUKOCYTESUR NEGATIVE 04/03/2020 0228   Sepsis Labs: @LABRCNTIP (procalcitonin:4,lacticidven:4)  ) Recent Results (from the past 240 hour(s))  Surgical pcr screen     Status: None   Collection Time: 07/01/21  6:22 PM   Specimen: Nasal Mucosa; Nasal Swab  Result Value Ref Range Status   MRSA, PCR NEGATIVE NEGATIVE Final   Staphylococcus aureus NEGATIVE NEGATIVE Final    Comment: (NOTE) The Xpert SA Assay (FDA approved for NASAL specimens in patients 55 years of age and older), is one component of a comprehensive surveillance program. It is not intended to diagnose infection nor to guide or monitor treatment. Performed at [facility], [place] [address], [place], [place] [address]   Aerobic/Anaerobic Culture w Gram Stain (surgical/deep wound)      Status: None (Preliminary result)   Collection Time: 07/01/21  7:39 PM   Specimen: Wound  Result Value Ref Range Status   Specimen Description   Final    WOUND KNEE LEFT Performed at [facility] [address], [place], [place] [address]    Special Requests   Final    NONE Performed at [facility], [place] [address], [place], [place] [address]    Gram Stain   Final    NO ORGANISMS SEEN SQUAMOUS EPITHELIAL CELLS PRESENT MODERATE WBC PRESENT,BOTH  PMN AND MONONUCLEAR MODERATE GRAM NEGATIVE RODS FEW GRAM POSITIVE COCCI    Culture   Final    CULTURE REINCUBATED FOR BETTER GROWTH Performed at [facility], [place] [address], [place], [place] [address]    Report Status PENDING  Incomplete      Studies: No results found.  Scheduled Meds:  (feeding supplement) PROSource Plus  30 mL Oral BID BM   amLODipine  10 mg Oral Daily   [START ON 07/06/2021] cephALEXin  250 mg Oral QHS   docusate sodium  100 mg Oral BID   feeding supplement (GLUCERNA SHAKE)  237 mL Oral Q24H   FLUoxetine  40 mg Oral QPM   insulin aspart  0-20 Units Subcutaneous TID WC   insulin aspart  0-5 Units Subcutaneous QHS   insulin aspart  3 Units Subcutaneous TID WC   insulin glargine-yfgn  55 Units Subcutaneous QHS   methocarbamol  1,000 mg Oral BID   multivitamin with minerals  1 tablet Oral Daily   pantoprazole  40 mg Oral Daily   pioglitazone  45 mg Oral Daily   polyethylene glycol  17 g Oral BID   Ensure Max Protein  11 oz Oral Daily   senna  1 tablet Oral Daily   tamsulosin  0.4 mg Oral Daily    Continuous Infusions:  sodium chloride 75 mL/hr at 07/03/21 2115    ceFAZolin (ANCEF) IV 2 g (07/04/21 6812)   heparin 2,550 Units/hr (07/04/21 0755)   methocarbamol (ROBAXIN) IV 500 mg (07/01/21 2138)     LOS: 22 days     Kayleen Memos, MD [facility] Pager [phone]  If 7PM-7AM, please contact night-coverage [email or web address] Password  [email or web address] 07/04/2021, 10:04 AM

## 2021-07-04 NOTE — Progress Notes (Signed)
  ANTICOAGULATION CONSULT NOTE - Follow Up Consult  Pharmacy Consult for Heparin Indication: h/o pulmonary embolus  Allergies  Allergen Reactions   Tape     Peels skin on legs   Chlorhexidine Rash    Wipes    Patient Measurements: Height: 6\' 1"  (185.4 cm) Weight: (!) 218 kg (480 lb 9.6 oz) IBW/kg (Calculated) : 79.9 Heparin Dosing Weight: 132.5 kg  Vital Signs: Temp: 98.6 F (37 C) (10/08 1422) Temp Source: Oral (10/08 1422) BP: 114/37 (10/08 1422) Pulse Rate: 72 (10/08 1422)  Labs: Recent Labs    07/02/21 0504 07/02/21 1546 07/03/21 0007 07/03/21 0546 07/03/21 1827 07/04/21 0118 07/04/21 1435  HGB 10.6*  --  9.0*  --   --  8.1*  --   HCT 35.8*  --  30.3*  --   --  27.1*  --   PLT 344  --  305  --   --  294  --   LABPROT 19.8*  --  25.0*  --   --  21.2*  --   INR 1.7*  --  2.3*  --   --  1.8*  --   HEPARINUNFRC  --    < > 0.49 0.59  --  0.29* 0.42  CREATININE  --   --   --   --  2.23* 2.37*  --    < > = values in this interval not displayed.     Estimated Creatinine Clearance: 60.2 mL/min (A) (by C-G formula based on SCr of 2.37 mg/dL (H)).   Medications:  PTA Warfarin:  5 mg on Sun/Mon/Wed/Fri evening, and 7.5 mg on Tue/Thur/Sat evening.   INR was supratherapeutic (6.2) at admission  Assessment: 71 YOM on lifelong warfarin therapy for hx of PE, body habitus, and sedentary lifestyle admitted on 9/15 with left knee dislocation and effusion concerning for hematoma. At admission, warfarin was held, vitamin K was given for supratherapeutic INR, and pt was transfused.  Most recent pharmacy consult to begin heparin when INR decreased to < 2 while held for surgery. Heparin IV began on 10/5 with an INR of 1.7.   Significant Events: Warfarin held 9/15-9/22 Vitamin K given 9/15-9/17 Warfarin resumed 9/23 with heparin bridge (completed 9/30) Warfarin held 10/3 Heparin started 10/5 for INR < 2, held 10/5 PM for surgery, restarted 10/6 AM  Today, 07/04/21 INR down  1.8, warfarin held since 10/3 HL therapeutic at 0.42 on 2550 units/hr CBC: Hgb trending down to 8.1, Plt wnl  No postop bleeding reported. Wound VAC in place. No complications per RN.   Goal of Therapy:  Heparin level 0.3-0.7 units/ml Monitor platelets by anticoagulation protocol: Yes   Plan:  Continue heparin infusion at 2550 units/hr  Confirmatory 6-hour heparin level  Daily heparin level and CBC Monitor for s/sx bleeding Follow up plans for further surgery next Wednesday with Dr. Sharol Given  Thank you for allowing pharmacy to part of this patient's care!  Marzella Schlein Rogers, Stage manager

## 2021-07-04 NOTE — Progress Notes (Signed)
PT placed self on home cpap

## 2021-07-04 NOTE — Plan of Care (Signed)
  Problem: Health Behavior/Discharge Planning: Goal: Ability to manage health-related needs will improve Outcome: Progressing   Problem: Clinical Measurements: Goal: Ability to maintain clinical measurements within normal limits will improve Outcome: Progressing   Problem: Activity: Goal: Risk for activity intolerance will decrease Outcome: Progressing   Problem: Nutrition: Goal: Adequate nutrition will be maintained Outcome: Progressing   Problem: Coping: Goal: Level of anxiety will decrease Outcome: Progressing   Problem: Elimination: Goal: Will not experience complications related to bowel motility Outcome: Progressing   Problem: Pain Managment: Goal: General experience of comfort will improve Outcome: Progressing   Problem: Safety: Goal: Ability to remain free from injury will improve Outcome: Progressing   Problem: Skin Integrity: Goal: Risk for impaired skin integrity will decrease Outcome: Progressing   Report received from Sharyn Lull. Patient received to floor in stable condition via stretcher/EMS. Transferred to bed via slide and maximum assist. Unable to obtain bariatric bed, per [facility] there are no bariatric beds in house. VS obtained, shift assessments completed - see flowsheets. PRN pain medications given as needed - see MAR; patient states adequate relief and denies need for further intervention. Patient needs encouragement to turn and reposition q2hr. LLE external fixator in place, dressing CDI, NPWT to site. RLE dressing changed - vaseline gauze to site and secured with kerlex. CPAP in use while sleeping. Patient currently resting in bed, bed in lowest position. Denies needs. Call bell within reach.

## 2021-07-04 NOTE — Progress Notes (Signed)
  ANTICOAGULATION CONSULT NOTE - Follow Up Consult  Pharmacy Consult for Heparin Indication: h/o pulmonary embolus  Allergies  Allergen Reactions   Tape     Peels skin on legs   Chlorhexidine Rash    Wipes    Patient Measurements: Height: 6\' 1"  (185.4 cm) Weight: (!) 218 kg (480 lb 9.6 oz) IBW/kg (Calculated) : 79.9 Heparin Dosing Weight: 132.5 kg  Vital Signs: Temp: 97.8 F (36.6 C) (10/08 0743) Temp Source: Oral (10/08 0743) BP: 134/41 (10/08 0743) Pulse Rate: 75 (10/08 0743)  Labs: Recent Labs    07/02/21 0504 07/02/21 1546 07/03/21 0007 07/03/21 0546 07/03/21 1827 07/04/21 0118  HGB 10.6*  --  9.0*  --   --  8.1*  HCT 35.8*  --  30.3*  --   --  27.1*  PLT 344  --  305  --   --  294  LABPROT 19.8*  --  25.0*  --   --  21.2*  INR 1.7*  --  2.3*  --   --  1.8*  HEPARINUNFRC  --    < > 0.49 0.59  --  0.29*  CREATININE  --   --   --   --  2.23* 2.37*   < > = values in this interval not displayed.     Estimated Creatinine Clearance: 60.2 mL/min (A) (by C-G formula based on SCr of 2.37 mg/dL (H)).   Medications:  PTA Warfarin:  5 mg on Sun/Mon/Wed/Fri evening, and 7.5 mg on Tue/Thur/Sat evening.   INR was supratherapeutic (6.2) at admission  Assessment: 23 YOM on lifelong warfarin therapy for hx of PE, body habitus, and sedentary lifestyle admitted on 9/15 with left knee dislocation and effusion concerning for hematoma. At admission, warfarin was held, vitamin K was given for supratherapeutic INR, and pt was transfused.  Most recent pharmacy consult to begin heparin when INR decreased to < 2 while held for surgery. Heparin IV began on 10/5 with an INR of 1.7.   Significant Events: Warfarin held 9/15-9/22 Vitamin K given 9/15-9/17 Warfarin resumed 9/23 with heparin bridge (completed 9/30) Warfarin held 10/3 Heparin started 10/5 for INR < 2, held 10/5 PM for surgery, restarted 10/6 AM  Today, 07/04/21 INR down 1.8, warfarin held since 10/3 HL slightly  subtherapeutic at 0.29 on 2500 units/hr CBC: Hgb trending down to 8.1, Plt wnl  No postop bleeding reported. Wound VAC in place. No complications per RN.   Goal of Therapy:  Heparin level 0.3-0.7 units/ml Monitor platelets by anticoagulation protocol: Yes   Plan:  Increase heparin infusion slightly to 2550 units/hr 6-hour heparin level  Daily heparin level and CBC Monitor for s/sx bleeding Follow up plans for further surgery next Wednesday with Dr. Sharol Given

## 2021-07-04 NOTE — Progress Notes (Signed)
[id number] attempted to call report to [facility] 5N, no answer.  [facility] report given and they are enroute to get patient. Had Aletha Halim charge nurse get Tamera Stands of [facility] to get RN number to receiving RN at [facility]. Olin Hauser made [facility] aware that pt is on a Bariatric Tilt bed here at [facility] RN called to get report on pt.  Informed receiving RN that pt was on a bariatric til bed and will require at a minimum a bariatric bed.

## 2021-07-05 DIAGNOSIS — S83105A Unspecified dislocation of left knee, initial encounter: Secondary | ICD-10-CM | POA: Diagnosis not present

## 2021-07-05 LAB — CBC
HCT: 27.5 % — ABNORMAL LOW (ref 39.0–52.0)
Hemoglobin: 8.5 g/dL — ABNORMAL LOW (ref 13.0–17.0)
MCH: 28.7 pg (ref 26.0–34.0)
MCHC: 30.9 g/dL (ref 30.0–36.0)
MCV: 92.9 fL (ref 80.0–100.0)
Platelets: 269 10*3/uL (ref 150–400)
RBC: 2.96 MIL/uL — ABNORMAL LOW (ref 4.22–5.81)
RDW: 16.4 % — ABNORMAL HIGH (ref 11.5–15.5)
WBC: 6.4 10*3/uL (ref 4.0–10.5)
nRBC: 0 % (ref 0.0–0.2)

## 2021-07-05 LAB — BASIC METABOLIC PANEL
Anion gap: 8 (ref 5–15)
BUN: 68 mg/dL — ABNORMAL HIGH (ref 8–23)
CO2: 23 mmol/L (ref 22–32)
Calcium: 8.2 mg/dL — ABNORMAL LOW (ref 8.9–10.3)
Chloride: 101 mmol/L (ref 98–111)
Creatinine, Ser: 2.09 mg/dL — ABNORMAL HIGH (ref 0.61–1.24)
GFR, Estimated: 35 mL/min — ABNORMAL LOW (ref >60)
Glucose, Bld: 181 mg/dL — ABNORMAL HIGH (ref 70–99)
Potassium: 5.2 mmol/L — ABNORMAL HIGH (ref 3.5–5.1)
Sodium: 132 mmol/L — ABNORMAL LOW (ref 135–145)

## 2021-07-05 LAB — GLUCOSE, CAPILLARY
Glucose-Capillary: 181 mg/dL — ABNORMAL HIGH (ref 70–99)
Glucose-Capillary: 177 mg/dL — ABNORMAL HIGH (ref 70–99)
Glucose-Capillary: 162 mg/dL — ABNORMAL HIGH (ref 70–99)
Glucose-Capillary: 130 mg/dL — ABNORMAL HIGH (ref 70–99)

## 2021-07-05 LAB — HEPARIN LEVEL (UNFRACTIONATED)
Heparin Unfractionated: 0.26 IU/mL — ABNORMAL LOW (ref 0.30–0.70)
Heparin Unfractionated: 0.6 IU/mL (ref 0.30–0.70)

## 2021-07-05 LAB — PROTIME-INR
INR: 1.6 — ABNORMAL HIGH (ref 0.8–1.2)
Prothrombin Time: 19 seconds — ABNORMAL HIGH (ref 11.4–15.2)

## 2021-07-05 MED ORDER — SODIUM ZIRCONIUM CYCLOSILICATE 10 G PO PACK
10.0000 g | PACK | Freq: Once | ORAL | Status: AC
  Administered 2021-07-05: 10 g via ORAL
  Filled 2021-07-05: qty 1

## 2021-07-05 NOTE — Progress Notes (Signed)
  ANTICOAGULATION CONSULT NOTE - Follow Up Consult  Pharmacy Consult for Heparin Indication: h/o pulmonary embolus  Allergies  Allergen Reactions   Tape     Peels skin on legs   Chlorhexidine Rash    Wipes    Patient Measurements: Height: 6\' 1"  (185.4 cm) Weight: (!) 218 kg (480 lb 9.6 oz) IBW/kg (Calculated) : 79.9 Heparin Dosing Weight: 132.5 kg  Vital Signs: Temp: 98.1 F (36.7 C) (10/08 2026) Temp Source: Oral (10/08 2026) BP: 119/54 (10/08 2026) Pulse Rate: 42 (10/08 2026)  Labs: Recent Labs    07/03/21 0007 07/03/21 0546 07/03/21 1827 07/04/21 0118 07/04/21 1435 07/05/21 0259  HGB 9.0*  --   --  8.1*  --  8.5*  HCT 30.3*  --   --  27.1*  --  27.5*  PLT 305  --   --  294  --  269  LABPROT 25.0*  --   --  21.2*  --  19.0*  INR 2.3*  --   --  1.8*  --  1.6*  HEPARINUNFRC 0.49   < >  --  0.29* 0.42 0.26*  CREATININE  --   --  2.23* 2.37*  --  2.09*   < > = values in this interval not displayed.     Estimated Creatinine Clearance: 68.2 mL/min (A) (by C-G formula based on SCr of 2.09 mg/dL (H)).   Medications:  PTA Warfarin:  5 mg on Sun/Mon/Wed/Fri evening, and 7.5 mg on Tue/Thur/Sat evening.   INR was supratherapeutic (6.2) at admission  Assessment: 11 YOM on lifelong warfarin therapy for hx of PE, body habitus, and sedentary lifestyle admitted on 9/15 with left knee dislocation and effusion concerning for hematoma. At admission, warfarin was held, vitamin K was given for supratherapeutic INR, and pt was transfused.  Most recent pharmacy consult to begin heparin when INR decreased to < 2 while held for surgery. Heparin IV began on 10/5 with an INR of 1.7.   Significant Events: Warfarin held 9/15-9/22 Vitamin K given 9/15-9/17 Warfarin resumed 9/23 with heparin bridge (completed 9/30) Warfarin held 10/3 Heparin started 10/5 for INR < 2, held 10/5 PM for surgery, restarted 10/6 AM  Today, 07/05/21 INR down 1.6, warfarin held since 10/3 HL  subtherapeutic at 0.26 on 2550 units/hr CBC: Hgb 8.5, Plt wnl  No postop bleeding reported. Wound VAC in place. No complications per RN.   Goal of Therapy:  Heparin level 0.3-0.7 units/ml Monitor platelets by anticoagulation protocol: Yes   Plan:  Increase heparin infusion to 2600 units/hr  Recheck 6-hour heparin level  Daily heparin level and CBC Monitor for s/sx bleeding Follow up plans for further surgery next Wednesday with Dr. Sharol Given  Thank you for allowing pharmacy to part of this patient's care!  Marzella Schlein [place], Stage manager

## 2021-07-05 NOTE — Plan of Care (Signed)
  Problem: Health Behavior/Discharge Planning: Goal: Ability to manage health-related needs will improve Outcome: Progressing   Problem: Clinical Measurements: Goal: Ability to maintain clinical measurements within normal limits will improve Outcome: Progressing   Problem: Activity: Goal: Risk for activity intolerance will decrease Outcome: Progressing   Problem: Nutrition: Goal: Adequate nutrition will be maintained Outcome: Progressing   Problem: Coping: Goal: Level of anxiety will decrease Outcome: Progressing   Problem: Elimination: Goal: Will not experience complications related to bowel motility Outcome: Progressing   Problem: Pain Managment: Goal: General experience of comfort will improve Outcome: Progressing   Problem: Safety: Goal: Ability to remain free from injury will improve Outcome: Progressing   Problem: Skin Integrity: Goal: Risk for impaired skin integrity will decrease Outcome: Not Progressing   Report received and care assumed from previous shift RN. Progressing towards goals as outlined above. VS obtained, shift assessments completed - see flowsheets. PRN pain medications given as needed for c/o L leg pain and chronic R knee pain - see MAR; patient states adequate relief and denies need for further intervention. Patient noncompliant with turning and repositioning - patient educated on risk for skin breakdown and encouraged to reposition q2hr or more, but declines. Also encouraged patient to apply foam dressing to bridge of nose to help prevent skin breakdown from CPAP machine, declines intervention. L knee dressing CDI, external fixator in place. CPAP in use when sleeping. Patient currently resting in bed, bed in lowest position. Denies needs. Call bell within reach.

## 2021-07-05 NOTE — Progress Notes (Signed)
PROGRESS NOTE  Joseph [name]. ZOX:[id number] DOB: [birth year] DOA: 06/11/2021 PCP: Donald Prose, MD  HPI/Recap of past 24 hours:  Joseph [name]. is a 65 y.o. male with PMH significant for morbid obesity, OSA on CPAP, DM2, HTN, HLD, PAD, DVT on Coumadin, GERD, anxiety/depression, polyneuropathy, multiple falls who was living at home. Patient presented to the ED on 9/15 after fall at home resulting in left knee pain.  He was found to have left knee dislocation and fracture fragments with associated left knee effusion with concern for possible hematoma in setting of supratherapeutic INR.  Admitted to hospitalist service Orthopedic surgery was consulted. Patient underwent closed reduction of left knee dislocation but no surgical management was planned because of patient's body habitus.  07/05/2021: Patient was seen and examined at his bedside.  No new complaints this morning.  Pain is well controlled on current pain management.  Assessment/Plan: Principal Problem:   Left knee dislocation, initial encounter Active Problems:   Pure hypercholesterolemia   Essential hypertension, benign   OSA (obstructive sleep apnea)   Hx pulmonary embolism   Esophageal reflux   Diabetes mellitus type 2, uncontrolled   Diabetic neuropathy (HCC)   Acute renal failure superimposed on stage 3b chronic kidney disease (HCC)   Normocytic anemia   Class 3 obesity (HCC)   Aortic atherosclerosis (HCC)   Coronary atherosclerosis   Effusion of left knee   Left knee dislocation   Pressure injury of skin   Unstageable pressure ulcer of sacral region ([place])  Left knee dislocation status post left knee reduction external fixation with evacuation hematoma  Left knee hematoma -Presented with fall.  X-ray showed left knee dislocation with several fracture fragments and hematoma. -Seen by orthopedic surgery, patient's knee reduced with recommendations for nonweightbearing status of left lower extremity, strict use of an  immobilizer and no range of motion..  -Because of patient's heavy body habitus, surgical fixation was not recommended at the time. -However, MRI obtained on 9/30 showed evidence of significant ligamentous injury in addition to lateral patellar dislocation.  Orthopedics team revisited the decision and made a plan of surgical fixation.  -On 10/5, patient underwent left knee extensive debridement, reduction of knee dislocation, external fixation spanning of knee dislocation and a wound VAC application. -Orthopedic surgery Dr. Marlou Sa recommended transfer to [facility] to be seen by Dr. Sharol Given for further evaluation and management.   Plan to return to the OR on Wednesday, 07/08/2021 with Dr. Sharol Given.   Improving AKI Baseline creatinine appears to be 1.2 with GFR greater than 60. Creatinine downtrending 2.09 from 2.37 with GFR of 30 Continue to avoid nephrotoxic agents, dehydration and hypotension Continue to monitor urine output Repeat BMP in the morning  Hyperkalemia suspect contributed by Ensure, oral supplement Serum potassium 5.2 Treated with Lokelma 10 g x 1 dose. May substitute Ensure for Nepro Repeat BMP in the morning  History of pulm embolism on Coumadin INR 1.8>> 1.6, on heparin drip. Coumadin is on hold due to planned procedure on 07/08/2021 by orthopedic surgery. Restart Coumadin when no further procedures are planned.  Type 2 diabetes mellitus Diabetic neuropathy -A1c 6.1 on 9/22 -Currently on Actos, Semglee 50 units nightly with NovoLog 3 units 3 times daily and sliding cell insulin.  Metformin on hold due to renal insufficiency. -Blood sugar level continues to remain elevated over 200.  Dose of Semglee increased from 15 units to 60 units.  We will continue the same for now.  If continues to remain  elevated again, need further escalation in dose. Diabetes coordinator   Essential hypertension BP is currently at goal Continue on amlodipine and lisinopril.    Hyperlipidemia -Pravastatin on hold.   Chronic normocytic anemia -Baseline hemoglobin over 10.  In this admission, patient received a total of 2 units of PRBC and 1 unit of FFP.  Hemoglobin this morning 8.1.  Contributed by surgical blood loss. Continue to monitor H&H, transfuse when indicated.   Resolved bradycardia  1st degree heart block Asymptomatic. In setting of known sleep apnea but occurs while awake as well. EKG significant for 1st degree AV block which does not appear to be new. Telemetry with heart rate down to high 30s while awake -Continue telemetry -Per cardiology, patient will get Zio patch at discharge.  Resolving hyperkalemia likely secondary to renal insufficiency. Serum potassium 5.1 from 5.6. Continue to closely monitor and treat as indicated.   Severe morbid obesity  -Body mass index is 63.41 kg/m. Patient has been advised to make an attempt to improve diet and exercise patterns to aid in weight loss. Bariatric bed in place.   GERD -Continue Protonix   OSA, compliant with CPAP Has been compliant with CPAP, encouraged to continue. -Continue CPAP qHS   Constipation, opiate induced. -in setting of opiate use. -Continue Colace, Senokot, Miralax -Dulcolax suppository prn   Pressure injury -Mid-nose, not present on admission. Secondary to CPAP mask   Mobility: PT eval postprocedure with orthopedic surgery's guidance. Code Status:   Code Status: Full Code  Nutritional status: Body mass index is 63.41 kg/m. Nutrition Problem: Inadequate oral intake Etiology: decreased appetite Signs/Symptoms: per patient/family report Diet:  Diet Order                  Diet Carb Modified Fluid consistency: Thin; Room service appropriate? Yes  Diet effective now                       DVT prophylaxis:  Heparin drip.   Antimicrobials: Not on scheduled antibiotics Fluid: None Consultants: Orthopedics Family Communication: None at bedside.   Status is:  Inpatient   Remains inpatient appropriate because: Plan for revision of surgery next week   Dispo: The patient is from: Home              Anticipated d/c is to: SNF likely              Patient currently is not medically stable to d/c.              Difficult to place patient No     Objective: Vitals:   07/04/21 0830 07/04/21 1422 07/04/21 2026 07/05/21 0753  BP: 126/84 (!) 114/37 (!) 119/54 (!) 123/43  Pulse: (!) 102 72 (!) 42 76  Resp: 15 17 18 16   Temp: 98.6 F (37 C) 98.6 F (37 C) 98.1 F (36.7 C) (!) 97.4 F (36.3 C)  TempSrc: Oral Oral Oral Oral  SpO2: 98% 100% 97% 96%  Weight:      Height:        Intake/Output Summary (Last 24 hours) at 07/05/2021 1333 Last data filed at 07/05/2021 0558 Gross per 24 hour  Intake 1153.29 ml  Output 400 ml  Net 753.29 ml   Filed Weights   06/11/21 1326 06/25/21 0900 07/01/21 1518  Weight: (!) 208.7 kg (!) 218 kg (!) 218 kg    Exam:  General: 65 y.o. year-old male severely morbidly obese in no acute distress.  He is  alert oriented x3. Cardiovascular: Regular rate and rhythm with no rubs or gallops. Respiratory: Clear to auscultation with no wheezes or rales.  Abdomen: Severely morbidly obese soft nontender bowel sounds present. Musculoskeletal: Trace lower extremity edema bilaterally. Skin: Nonspecific lesions noted. Psychiatry: Mood is appropriate for condition and setting.   Data Reviewed: CBC: Recent Labs  Lab 07/01/21 0446 07/02/21 0504 07/03/21 0007 07/04/21 0118 07/05/21 0259  WBC 6.5 8.9 7.0 6.8 6.4  HGB 11.0* 10.6* 9.0* 8.1* 8.5*  HCT 36.4* 35.8* 30.3* 27.1* 27.5*  MCV 94.8 96.8 95.6 94.1 92.9  PLT 396 344 305 294 272   Basic Metabolic Panel: Recent Labs  Lab 07/01/21 0446 07/03/21 1827 07/04/21 0118 07/05/21 0259  NA 135 135 131* 132*  K 5.1 5.6* 5.1 5.2*  CL 102 104 102 101  CO2 27 23 24 23   GLUCOSE 157* 205* 224* 181*  BUN 42* 63* 63* 68*  CREATININE 1.17 2.23* 2.37* 2.09*  CALCIUM 8.5* 8.5*  8.0* 8.2*   GFR: Estimated Creatinine Clearance: 68.2 mL/min (A) (by C-G formula based on SCr of 2.09 mg/dL (H)). Liver Function Tests: No results for input(s): AST, ALT, ALKPHOS, BILITOT, PROT, ALBUMIN in the last 168 hours. No results for input(s): LIPASE, AMYLASE in the last 168 hours. No results for input(s): AMMONIA in the last 168 hours. Coagulation Profile: Recent Labs  Lab 07/01/21 0446 07/02/21 0504 07/03/21 0007 07/04/21 0118 07/05/21 0259  INR 1.7* 1.7* 2.3* 1.8* 1.6*   Cardiac Enzymes: No results for input(s): CKTOTAL, CKMB, CKMBINDEX, TROPONINI in the last 168 hours. BNP (last 3 results) No results for input(s): PROBNP in the last 8760 hours. HbA1C: No results for input(s): HGBA1C in the last 72 hours. CBG: Recent Labs  Lab 07/04/21 1137 07/04/21 1612 07/04/21 2319 07/05/21 0654 07/05/21 1116  GLUCAP 193* 176* 165* 181* 177*   Lipid Profile: No results for input(s): CHOL, HDL, LDLCALC, TRIG, CHOLHDL, LDLDIRECT in the last 72 hours. Thyroid Function Tests: No results for input(s): TSH, T4TOTAL, FREET4, T3FREE, THYROIDAB in the last 72 hours. Anemia Panel: No results for input(s): VITAMINB12, FOLATE, FERRITIN, TIBC, IRON, RETICCTPCT in the last 72 hours. Urine analysis:    Component Value Date/Time   COLORURINE YELLOW 04/03/2020 0228   APPEARANCEUR CLEAR 04/03/2020 0228   LABSPEC 1.018 04/03/2020 0228   PHURINE 5.0 04/03/2020 0228   GLUCOSEU NEGATIVE 04/03/2020 0228   HGBUR NEGATIVE 04/03/2020 0228   BILIRUBINUR NEGATIVE 04/03/2020 0228   KETONESUR 5 (A) 04/03/2020 0228   PROTEINUR NEGATIVE 04/03/2020 0228   UROBILINOGEN 0.2 12/30/2014 2032   NITRITE NEGATIVE 04/03/2020 0228   LEUKOCYTESUR NEGATIVE 04/03/2020 0228   Sepsis Labs: @LABRCNTIP (procalcitonin:4,lacticidven:4)  ) Recent Results (from the past 240 hour(s))  Surgical pcr screen     Status: None   Collection Time: 07/01/21  6:22 PM   Specimen: Nasal Mucosa; Nasal Swab  Result Value Ref  Range Status   MRSA, PCR NEGATIVE NEGATIVE Final   Staphylococcus aureus NEGATIVE NEGATIVE Final    Comment: (NOTE) The Xpert SA Assay (FDA approved for NASAL specimens in patients 23 years of age and older), is one component of a comprehensive surveillance program. It is not intended to diagnose infection nor to guide or monitor treatment. Performed at [facility], [place] [address] [place], [place] [address]   Aerobic/Anaerobic Culture w Gram Stain (surgical/deep wound)     Status: Abnormal (Preliminary result)   Collection Time: 07/01/21  7:39 PM   Specimen: Wound  Result Value Ref Range Status  Specimen Description   Final    WOUND KNEE LEFT Performed at [place] [address] [name], [place] [address]    Special Requests   Final    NONE Performed at [facility], [place] [address], [place], [place] [address]    Gram Stain   Final    NO ORGANISMS SEEN SQUAMOUS EPITHELIAL CELLS PRESENT MODERATE WBC PRESENT,BOTH PMN AND MONONUCLEAR MODERATE GRAM NEGATIVE RODS FEW GRAM POSITIVE COCCI    Culture (A)  Final    MULTIPLE ORGANISMS PRESENT, NONE PREDOMINANT NO STAPHYLOCOCCUS AUREUS ISOLATED NO GROUP A STREP (S.PYOGENES) ISOLATED NO ANAEROBES ISOLATED Performed at [facility], [place] [address] [place], [place] [address]    Report Status PENDING  Incomplete      Studies: No results found.  Scheduled Meds:  (feeding supplement) PROSource Plus  30 mL Oral BID BM   amLODipine  10 mg Oral Daily   [START ON 07/06/2021] cephALEXin  250 mg Oral QHS   docusate sodium  100 mg Oral BID   feeding supplement (GLUCERNA SHAKE)  237 mL Oral Q24H   FLUoxetine  40 mg Oral QPM   insulin aspart  0-20 Units Subcutaneous TID WC   insulin aspart  0-5 Units Subcutaneous QHS   insulin aspart  3 Units Subcutaneous TID WC   insulin glargine-yfgn  55 Units Subcutaneous QHS   methocarbamol  1,000 mg Oral BID   multivitamin with  minerals  1 tablet Oral Daily   pantoprazole  40 mg Oral Daily   pioglitazone  45 mg Oral Daily   polyethylene glycol  17 g Oral BID   Ensure Max Protein  11 oz Oral Daily   senna  1 tablet Oral Daily   tamsulosin  0.4 mg Oral Daily    Continuous Infusions:  sodium chloride 75 mL/hr at 07/05/21 0412    ceFAZolin (ANCEF) IV 2 g (07/05/21 0557)   heparin 2,600 Units/hr (07/05/21 0834)   methocarbamol (ROBAXIN) IV 500 mg (07/01/21 2138)     LOS: 23 days     Kayleen Memos, MD [facility] Pager [phone]  If 7PM-7AM, please contact night-coverage [email or web address] Password [email or web address] 07/05/2021, 1:33 PM

## 2021-07-05 NOTE — Progress Notes (Signed)
Pt transferred from 5N25 to 5N17 due to pt's requirements of hoist/lift for transferring from bed to chair. Charge nurse aware and attending MD made aware of above. Pt remains alert/oriented in no apparent distress. No complaints. Hoping if could have a Kreg's bed for better mobility, still awaiting a PT consult. Cardiac telemonitoring informed of transfer.

## 2021-07-05 NOTE — Progress Notes (Signed)
  ANTICOAGULATION CONSULT NOTE - Follow Up Consult  Pharmacy Consult for Heparin Indication: h/o pulmonary embolus  Allergies  Allergen Reactions   Tape     Peels skin on legs   Chlorhexidine Rash    Wipes    Patient Measurements: Height: 6\' 1"  (185.4 cm) Weight: (!) 218 kg (480 lb 9.6 oz) IBW/kg (Calculated) : 79.9 Heparin Dosing Weight: 132.5 kg  Vital Signs: Temp: 97.4 F (36.3 C) (10/09 0753) Temp Source: Oral (10/09 0753) BP: 123/43 (10/09 0753) Pulse Rate: 76 (10/09 0753)  Labs: Recent Labs    07/03/21 0007 07/03/21 0546 07/03/21 1827 07/04/21 0118 07/04/21 1435 07/05/21 0259 07/05/21 1424  HGB 9.0*  --   --  8.1*  --  8.5*  --   HCT 30.3*  --   --  27.1*  --  27.5*  --   PLT 305  --   --  294  --  269  --   LABPROT 25.0*  --   --  21.2*  --  19.0*  --   INR 2.3*  --   --  1.8*  --  1.6*  --   HEPARINUNFRC 0.49   < >  --  0.29* 0.42 0.26* 0.60  CREATININE  --   --  2.23* 2.37*  --  2.09*  --    < > = values in this interval not displayed.     Estimated Creatinine Clearance: 68.2 mL/min (A) (by C-G formula based on SCr of 2.09 mg/dL (H)).   Medications:  PTA Warfarin:  5 mg on Sun/Mon/Wed/Fri evening, and 7.5 mg on Tue/Thur/Sat evening.   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 At admission, warfarin was held, vitamin K was given for supratherapeutic INR, and pt was transfused.  Most recent pharmacy consult to begin heparin when INR decreased to < 2 while held for surgery. Heparin IV began on 10/5 with an INR of 1.7.   Significant Events: Warfarin held 9/15-9/22 Vitamin K given 9/15-9/17 Warfarin resumed 9/23 with heparin bridge (completed 9/30) Warfarin held 10/3 Heparin started 10/5 for INR < 2, held 10/5 PM for surgery, restarted 10/6 AM  Today, 07/05/21 INR  down 1.6, warfarin held since 10/3 HL therapeutic at 0.60 on 2600 units/hr CBC: Hgb 8.5, Plt wnl  No postop bleeding reported. Wound VAC in place. No complications per RN.   Goal of Therapy:  Heparin level 0.3-0.7 units/ml Monitor platelets by anticoagulation protocol: Yes   Plan:   Continue heparin infusion to 2600 units/hr Daily heparin level and CBC Monitor for s/sx bleeding Follow up plans for further surgery next Wednesday with Dr. Sharol Given  Thank you for allowing pharmacy to part of this patient's care!  Marzella Schlein [place], Stage manager

## 2021-07-05 NOTE — Plan of Care (Signed)
  Problem: Health Behavior/Discharge Planning: Goal: Ability to manage health-related needs will improve Outcome: Progressing   Problem: Clinical Measurements: Goal: Ability to maintain clinical measurements within normal limits will improve Outcome: Progressing   Problem: Activity: Goal: Risk for activity intolerance will decrease Outcome: Progressing   Problem: Nutrition: Goal: Adequate nutrition will be maintained Outcome: Progressing   Problem: Coping: Goal: Level of anxiety will decrease Outcome: Progressing   Problem: Elimination: Goal: Will not experience complications related to bowel motility Outcome: Progressing   Problem: Pain Managment: Goal: General experience of comfort will improve Outcome: Progressing   Problem: Safety: Goal: Ability to remain free from injury will improve Outcome: Progressing   Problem: Skin Integrity: Goal: Risk for impaired skin integrity will decrease Outcome: Progressing   Report received from previous shift and care assumed. Progressing towards goals as outlined above. VS obtained, shift assessments completed - see flowsheets. PRN pain medications given as needed - see MAR; patient denies need for further intervention. L LE dressing CDI, NPWT in place/intact, external fixator intact. External catheter in place for skin breakdown prevention. Patient with large BM this shift, incontinent/peri care provided. Skin protectant applied to b/l buttocks, abdominal folds; mepelex foam dressing placed over R buttock skin tear and R buttock closed blister. Nystatin powder applied to abdominal folds. Patient currently resting in bed, bed in lowest position. Denies needs. Call bell within reach.

## 2021-07-05 NOTE — Progress Notes (Signed)
Patient on home CPAP.  RT assistance not needed at this time.

## 2021-07-05 NOTE — Progress Notes (Signed)
   Subjective: 4 Days Post-Op Procedure(s) (LRB): LEFT KNEE REDUCTION EXTERNAL FIXATION WITH EVACUATION HEMATOMA (Left) Patient reports pain as mild.    Objective: Vital signs in last 24 hours: Temp:  [97.4 F (36.3 C)-98.6 F (37 C)] 97.4 F (36.3 C) (10/09 0753) Pulse Rate:  [42-76] 76 (10/09 0753) Resp:  [16-18] 16 (10/09 0753) BP: (114-123)/(37-54) 123/43 (10/09 0753) SpO2:  [96 %-100 %] 96 % (10/09 0753)  Intake/Output from previous day: 10/08 0701 - 10/09 0700 In: 1153.3 [P.O.:200; I.V.:738.3; IV Piggyback:215] Out: 400 [Urine:300; Drains:100] Intake/Output this shift: No intake/output data recorded.  Recent Labs    07/03/21 0007 07/04/21 0118 07/05/21 0259  HGB 9.0* 8.1* 8.5*   Recent Labs    07/04/21 0118 07/05/21 0259  WBC 6.8 6.4  RBC 2.88* 2.96*  HCT 27.1* 27.5*  PLT 294 269   Recent Labs    07/04/21 0118 07/05/21 0259  NA 131* 132*  K 5.1 5.2*  CL 102 101  CO2 24 23  BUN 63* 68*  CREATININE 2.37* 2.09*  GLUCOSE 224* 181*  CALCIUM 8.0* 8.2*   Recent Labs    07/04/21 0118 07/05/21 0259  INR 1.8* 1.6*    Fixator intact.  No results found.  Assessment/Plan: 4 Days Post-Op Procedure(s) (LRB): LEFT KNEE REDUCTION EXTERNAL FIXATION WITH EVACUATION HEMATOMA (Left) Plan: continue external fixator to keep knee reduced.   Joseph [name] 07/05/2021, 9:32 AM

## 2021-07-06 DIAGNOSIS — S83105A Unspecified dislocation of left knee, initial encounter: Secondary | ICD-10-CM | POA: Diagnosis not present

## 2021-07-06 LAB — BASIC METABOLIC PANEL WITH GFR
Anion gap: 7 (ref 5–15)
BUN: 67 mg/dL — ABNORMAL HIGH (ref 8–23)
CO2: 22 mmol/L (ref 22–32)
Calcium: 8.3 mg/dL — ABNORMAL LOW (ref 8.9–10.3)
Chloride: 104 mmol/L (ref 98–111)
Creatinine, Ser: 1.73 mg/dL — ABNORMAL HIGH (ref 0.61–1.24)
GFR, Estimated: 44 mL/min — ABNORMAL LOW (ref >60)
Glucose, Bld: 132 mg/dL — ABNORMAL HIGH (ref 70–99)
Potassium: 5 mmol/L (ref 3.5–5.1)
Sodium: 133 mmol/L — ABNORMAL LOW (ref 135–145)

## 2021-07-06 LAB — PROTIME-INR
INR: 1.4 — ABNORMAL HIGH (ref 0.8–1.2)
Prothrombin Time: 16.9 seconds — ABNORMAL HIGH (ref 11.4–15.2)

## 2021-07-06 LAB — CBC
HCT: 26.6 % — ABNORMAL LOW (ref 39.0–52.0)
Hemoglobin: 8.2 g/dL — ABNORMAL LOW (ref 13.0–17.0)
MCH: 28.9 pg (ref 26.0–34.0)
MCHC: 30.8 g/dL (ref 30.0–36.0)
MCV: 93.7 fL (ref 80.0–100.0)
Platelets: 260 K/uL (ref 150–400)
RBC: 2.84 MIL/uL — ABNORMAL LOW (ref 4.22–5.81)
RDW: 16.3 % — ABNORMAL HIGH (ref 11.5–15.5)
WBC: 5.6 K/uL (ref 4.0–10.5)
nRBC: 0 % (ref 0.0–0.2)

## 2021-07-06 LAB — AEROBIC/ANAEROBIC CULTURE W GRAM STAIN (SURGICAL/DEEP WOUND)

## 2021-07-06 LAB — GLUCOSE, CAPILLARY
Glucose-Capillary: 97 mg/dL (ref 70–99)
Glucose-Capillary: 145 mg/dL — ABNORMAL HIGH (ref 70–99)
Glucose-Capillary: 122 mg/dL — ABNORMAL HIGH (ref 70–99)
Glucose-Capillary: 127 mg/dL — ABNORMAL HIGH (ref 70–99)

## 2021-07-06 LAB — HEPARIN LEVEL (UNFRACTIONATED): Heparin Unfractionated: 0.49 [IU]/mL (ref 0.30–0.70)

## 2021-07-06 NOTE — Progress Notes (Signed)
Occupational Therapy Treatment Patient Details Name: Joseph [name]. MRN: [id number] DOB: December 23, 1955 Today's Date: 07/06/2021   History of present illness Avontae Burkhead is a 65 yo male who presnets with increased LLE swelling after recent fall at home. 9/15 pt fell when getting off x-ray table at hospital. X-rays showed a knee dislocation which was confirmed on CT scan as a posterior lateral dislocation; L knee reduced and L KI placed. Lumbar and pelvic xrays negative. 10/5: left knee dislocation reduction with external fixation placement and extensive debridement of hematoma and devitalized skin subtenons tissue muscle fascia.  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   OT comments  This 65 yo male admitted and underwent above presents to acute OT with getting OOB today via maxi sky to see how he tolerated it with new left leg long external fixator. He tolerated relatively well with plan only to sit up a short time. The weight and make of the external fixator makes it so much more difficult for him to [facility] himself. He is motivated to do as much for himself as he can. Kreg tilt bed has been ordered. He will continue to benefit from acute OT with follow up at SNF. Goals updated.   Recommendations for follow up therapy are one component of a multi-disciplinary discharge planning process, led by the attending physician.  Recommendations may be updated based on patient status, additional functional criteria and insurance authorization.    Follow Up Recommendations  CIR;Supervision/Assistance - 24 hour    Equipment Recommendations  Other (comment) (TBD next venue)       Precautions / Restrictions Precautions Precautions: Fall Restrictions Weight Bearing Restrictions: Yes LLE Weight Bearing: Non weight bearing       Mobility Bed Mobility Overal bed mobility: Needs Assistance Bed Mobility: Rolling Rolling: Total assist  (+3 with use of bed pads--on air matteress bed)         General bed mobility comments: Pt in air mattress bed that gives him little room to [facility] Pt now also with external fixator. Pt can use UB to help intiate turn to left and right and RLE to help turn to right--needs A for trunk/panus, and LLE.              Vision Baseline Vision/History: 1 Wears glasses Patient Visual Report: No change from baseline            Cognition Arousal/Alertness: Awake/alert Behavior During Therapy: WFL for tasks assessed/performed Overall Cognitive Status: Within Functional Limits for tasks assessed                                                     Pertinent Vitals/ Pain       Pain Assessment: 0-10 Pain Score: 7  Pain Location: Left knee with movement in bed and from bed>recliner Pain Descriptors / Indicators: Grimacing;Aching;Sore;Discomfort;Guarding Pain Intervention(s): Limited activity within patient's tolerance;Monitored during session;Repositioned;Premedicated before session;Patient requesting pain meds-RN notified         Frequency  Min 2X/week        Progress Toward Goals  OT Goals(current goals can now be found in the care plan section)  Progress towards OT goals: Not progressing toward goals - comment (new long leg external fixator as of 10/5 making it even more difficult  for him to try to sit on EOB and very uncomfortable for bed>recliner with Maxi Sky.)  Acute Rehab OT Goals Patient Stated Goal: to help move as much as possible OT Goal Formulation: With patient/family Time For Goal Achievement: 07/20/21 Potential to Achieve Goals: Good  Plan Discharge plan needs to be updated    Co-evaluation    PT/OT/SLP Co-Evaluation/Treatment: Yes Reason for Co-Treatment: For patient/therapist safety PT goals addressed during session: Mobility/safety with mobility;Strengthening/ROM OT goals addressed during session: Strengthening/ROM      AM-PAC  OT "6 Clicks" Daily Activity     Outcome Measure   Help from another person eating meals?: None Help from another person taking care of personal grooming?: A Little Help from another person toileting, which includes using toliet, bedpan, or urinal?: Total Help from another person bathing (including washing, rinsing, drying)?: A Lot Help from another person to put on and taking off regular upper body clothing?: A Lot Help from another person to put on and taking off regular lower body clothing?: Total 6 Click Score: 13    End of Session    OT Visit Diagnosis: Other abnormalities of gait and mobility (R26.89);Muscle weakness (generalized) (M62.81);Pain Pain - Right/Left: Left Pain - part of body: Leg   Activity Tolerance Patient tolerated treatment well   Patient Left in chair;with call bell/phone within reach;with family/visitor present (in standard bariatric recliner on unit)   Nurse Communication Patient requests pain meds        Time: [id number] OT Time Calculation (min): 61 min  Charges: OT General Charges $OT Visit: 1 Visit OT Treatments $Therapeutic Activity: 23-37 mins  [place] Circle, OTR/L Acute [employer] Pager [phone] Office [phone]    Almon Register 07/06/2021, 12:21 PM

## 2021-07-06 NOTE — Progress Notes (Signed)
Physical Therapy Treatment Patient Details Name: Joseph [name]. MRN: [id number] DOB: [birth year] Today's Date: 07/06/2021   History of Present Illness Joseph [name] is a 65 yo male who presnets with increased LLE swelling after recent fall at home. 9/15 pt fell when getting off x-ray table at hospital. X-rays showed a knee dislocation which was confirmed on CT scan as a posterior lateral dislocation; L knee reduced and L KI placed. Lumbar and pelvic xrays negative. 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    PT Comments    Continuing work on functional mobility and activity tolerance;  session focused on assisting pt back to bed via [place], and while this particular lift is definitely uncomfortable for pt, the second use of it went more smoothly;   Changed out beds for a bari bed that will go wider in the hopes of more comfortable positioning for pt; still await Kreg bed; Alma Friendly, RN has [employer], for when we get the OK to order the bed  Recommendations for follow up therapy are one component of a multi-disciplinary discharge planning process, led by the attending physician.  Recommendations may be updated based on patient status, additional functional criteria and insurance authorization.  Follow Up Recommendations  SNF     Equipment Recommendations  Wheelchair (measurements PT);Wheelchair cushion (measurements PT);Hospital bed (Bariatric Wheelchair, perhaps drop-arm bariatric commode; will depende on progress)    Recommendations for Other Services       Precautions / Restrictions Precautions Precautions: Fall Precaution Comments: NO knee ROM on L-STRICT Restrictions LLE Weight Bearing: Non weight bearing      Mobility  Bed Mobility Overal bed mobility: Needs Assistance Bed Mobility: Rolling Rolling: Total assist (+3 with use of bed pads--on air matteress bed)         General bed mobility comments: Pt can use UB to help intiate turn to left and right and RLE to help turn to right--needs A for trunk/panus, and LLE.    Transfers Overall transfer level: Needs assistance               General transfer comment: Performed dependent mechanical lift transfer with 3 person assist; One person's job to to fully support LLE during transition, other 2 for lift and equiment  Ambulation/Gait                 Stairs             Wheelchair Mobility    Modified Rankin (Stroke Patients Only)       Balance                                            Cognition Arousal/Alertness: Awake/alert Behavior During Therapy: WFL for tasks assessed/performed Overall Cognitive Status: Within Functional Limits for tasks assessed                                        Exercises      General Comments General comments (skin integrity, edema, etc.): RN and NT in to assist with mechanical  lift transfer      Pertinent Vitals/Pain Pain Assessment: 0-10 Pain Score: 8  Pain Location: Left knee with movement in bed and from bed>recliner Pain Descriptors / Indicators: Grimacing;Aching;Sore;Discomfort;Guarding Pain Intervention(s): Premedicated before session;Monitored during session    Home Living                      Prior Function            PT Goals (current goals can now be found in the care plan section) Acute Rehab PT Goals Patient Stated Goal: to help move as much as possible PT Goal Formulation: With patient/family Time For Goal Achievement: 07/20/21 Potential to Achieve Goals: Good Progress towards PT goals: Progressing toward goals    Frequency    Min 3X/week      PT Plan Current plan remains appropriate     Co-evaluation              AM-PAC PT "6 Clicks" Mobility   Outcome Measure  Help needed turning from your back to your side while in a flat bed without using bedrails?: A Lot Help needed moving from lying on your back to sitting on the side of a flat bed without using bedrails?: A Lot Help needed moving to and from a bed to a chair (including a wheelchair)?: Total Help needed standing up from a chair using your arms (e.g., wheelchair or bedside chair)?: Total Help needed to walk in hospital room?: Total Help needed climbing 3-5 steps with a railing? : Total 6 Click Score: 8    End of Session   Activity Tolerance: Patient tolerated treatment well;Other (comment) (Still painful, but better than lift OOB to recliner) Patient left: in bed;with call bell/phone within reach Nurse Communication: Need for lift equipment PT Visit Diagnosis: Other abnormalities of gait and mobility (R26.89);Muscle weakness (generalized) (M62.81);History of falling (Z91.81);Repeated falls (R29.6);Pain Pain - Right/Left: Left Pain - part of body: Knee;Leg     Time: 1400-1430 PT Time Calculation (min) (ACUTE ONLY): 30 min  Charges:  $Therapeutic Activity: 23-37 mins                     Roney Marion, PT  Acute Rehabilitation Services Pager [phone] Office [phone]    Colletta [place] 07/06/2021, 4:32 PM

## 2021-07-06 NOTE — Progress Notes (Addendum)
PROGRESS NOTE  Joseph [name]. ELF:[id number] DOB: 1955/10/09 DOA: 06/11/2021 PCP: Donald Prose, MD  HPI/Recap of past 24 hours: Joseph [name]. is a 65 y.o. male with PMH significant for severe morbid obesity, OSA on CPAP, DM2, HTN, HLD, PAD, DVT on Coumadin, GERD, anxiety/depression, polyneuropathy, multiple falls who presented to the ED on 06/11/21 after fall at home resulting in left knee pain.  He was found to have left knee dislocation and fracture fragments with associated left knee effusion.  There were concern for possible left knee hematoma in setting of supratherapeutic INR.  Seen by orthopedic surgery, patient underwent closed reduction of left knee dislocation but no surgical management was planned because of patient's body habitus.  Patient is scheduled for a surgical procedure on 07/08/2021 by Dr. Sharol Given.  07/06/2021: Patient was seen at his bedside.  There were no acute events overnight.  PT OT with orthopedic surgery's guidance.  Assessment/Plan: Principal Problem:   Left knee dislocation, initial encounter Active Problems:   Pure hypercholesterolemia   Essential hypertension, benign   OSA (obstructive sleep apnea)   Hx pulmonary embolism   Esophageal reflux   Diabetes mellitus type 2, uncontrolled   Diabetic neuropathy (HCC)   Acute renal failure superimposed on stage 3b chronic kidney disease (HCC)   Normocytic anemia   Class 3 obesity (HCC)   Aortic atherosclerosis (HCC)   Coronary atherosclerosis   Effusion of left knee   Left knee dislocation   Pressure injury of skin   Unstageable pressure ulcer of sacral region ([place])  Left knee dislocation status post left knee reduction external fixation with evacuation hematoma  Left knee hematoma -Presented with fall.  X-ray showed left knee dislocation with several fracture fragments and hematoma. -Seen by orthopedic surgery, patient's knee reduced with recommendations for nonweightbearing status of left lower extremity,  strict use of an immobilizer and no range of motion..  -Because of patient's heavy body habitus, surgical fixation was not recommended at the time. -However, MRI obtained on 9/30 showed evidence of significant ligamentous injury in addition to lateral patellar dislocation.  Orthopedics team revisited the decision and made a plan of surgical fixation.  -On 10/5, patient underwent left knee extensive debridement, reduction of knee dislocation, external fixation spanning of knee dislocation and a wound VAC application. -Orthopedic surgery Dr. Marlou Sa recommended transfer to [facility] to be seen by Dr. Sharol Given for further evaluation and management.   Plan to return to the OR on Wednesday, 07/08/2021 with Dr. Sharol Given.   Improving AKI Baseline creatinine appears to be 1.2 with GFR greater than 60. Creatinine downtrending 1.6 from 2.09 from 2.37 with GFR of 30 Continue to avoid nephrotoxic agents, dehydration and hypotension Continue to monitor urine output Repeat BMP in the morning  Resolving posttreatment: Hyperkalemia suspect contributed by Ensure, oral supplement Serum potassium 5.2>> 5.0. Treated with Lokelma 10 g x 1 dose. May substitute Ensure for Nepro Repeat BMP in the morning  History of pulm embolism on Coumadin INR 1.8>> 1.6, on heparin drip. Coumadin is on hold due to planned procedure on 07/08/2021 by orthopedic surgery. Restart Coumadin when no further procedures are planned.  Type 2 diabetes mellitus Diabetic neuropathy -A1c 6.1 on 9/22 -Currently on Actos, Semglee 50 units nightly with NovoLog 3 units 3 times daily and sliding cell insulin.  Metformin on hold due to renal insufficiency. -Blood sugar level continues to remain elevated over 200.  Dose of Semglee increased from 15 units to 60 units.  We will  continue the same for now.  If continues to remain elevated again, need further escalation in dose.   Essential hypertension BP is currently at goal Continue on amlodipine and  lisinopril.   Hyperlipidemia -Pravastatin on hold.   Chronic normocytic anemia -Baseline hemoglobin over 10.  In this admission, patient received a total of 2 units of PRBC and 1 unit of FFP.  Hemoglobin this morning 8.2 from 8.5.  Contributed by surgical blood loss. Continue to monitor H&H, transfuse when indicated.   Transient bradycardia  1st degree heart block Asymptomatic. In setting of known sleep apnea but occurs while awake as well. EKG significant for 1st degree AV block which does not appear to be new. Telemetry with heart rate down to high 30s while awake -Continue telemetry -Per cardiology, patient will get Zio patch at discharge.   Severe morbid obesity  -Body mass index is 63.41 kg/m. Patient has been advised to make an attempt to improve diet and exercise patterns to aid in weight loss. Bariatric bed in place.   GERD -Continue Protonix   OSA, compliant with CPAP Has been compliant with CPAP, encouraged to continue. -Continue CPAP qHS   Constipation, opiate induced. -in setting of opiate use. -Continue Colace, Senokot, Miralax -Dulcolax suppository prn   Pressure injury -Mid-nose, not present on admission. Secondary to CPAP mask  Generalized weakness PT OT assessments with orthopedic surgery's guidance. Continue fall precautions   Mobility: PT eval postprocedure with orthopedic surgery's guidance. Code Status:   Code Status: Full Code  Nutritional status: Body mass index is 63.41 kg/m. Nutrition Problem: Inadequate oral intake Etiology: decreased appetite Signs/Symptoms: per patient/family report Diet:  Diet Order                  Diet Carb Modified Fluid consistency: Thin; Room service appropriate? Yes  Diet effective now                       DVT prophylaxis:  Heparin drip.   Antimicrobials: Not on scheduled antibiotics Fluid: None Consultants: Orthopedics Family Communication: None at bedside.   Status is: Inpatient   Remains  inpatient appropriate because: Plan for revision of surgery next week   Dispo: The patient is from: Home              Anticipated d/c is to: SNF versus CIR.              Patient currently is not medically stable to d/c.              Difficult to place patient No     Objective: Vitals:   07/05/21 1923 07/06/21 0256 07/06/21 0812 07/06/21 1135  BP: (!) 133/41 (!) 151/45 (!) 146/42 (!) 129/44  Pulse: 79 80 80 73  Resp: 16 16 17 15   Temp: 99 F (37.2 C) 99 F (37.2 C) 98.7 F (37.1 C) 98.9 F (37.2 C)  TempSrc: Oral Oral Oral Oral  SpO2: 95% 95% 96% 95%  Weight:      Height:        Intake/Output Summary (Last 24 hours) at 07/06/2021 1536 Last data filed at 07/06/2021 1233 Gross per 24 hour  Intake 1731 ml  Output 1600 ml  Net 131 ml   Filed Weights   06/11/21 1326 06/25/21 0900 07/01/21 1518  Weight: (!) 208.7 kg (!) 218 kg (!) 218 kg    Exam:  General: 65 y.o. year-old male severely morbidly obese in no acute distress.  He  is alert and oriented x3.   Cardiovascular: Regular rate and rhythm no rubs or gallops. Respiratory: Clear to auscultation with no wheezes or rales. Abdomen: Severely morbidly obese nontender with normal bowel sounds present.   Musculoskeletal: Trace lower extremity edema bilaterally. Skin: No rashes noted. Psychiatry: Mood is appropriate for condition and setting.   Data Reviewed: CBC: Recent Labs  Lab 07/02/21 0504 07/03/21 0007 07/04/21 0118 07/05/21 0259 07/06/21 0157  WBC 8.9 7.0 6.8 6.4 5.6  HGB 10.6* 9.0* 8.1* 8.5* 8.2*  HCT 35.8* 30.3* 27.1* 27.5* 26.6*  MCV 96.8 95.6 94.1 92.9 93.7  PLT 344 305 294 269 809   Basic Metabolic Panel: Recent Labs  Lab 07/01/21 0446 07/03/21 1827 07/04/21 0118 07/05/21 0259 07/06/21 0157  NA 135 135 131* 132* 133*  K 5.1 5.6* 5.1 5.2* 5.0  CL 102 104 102 101 104  CO2 27 23 24 23 22   GLUCOSE 157* 205* 224* 181* 132*  BUN 42* 63* 63* 68* 67*  CREATININE 1.17 2.23* 2.37* 2.09* 1.73*   CALCIUM 8.5* 8.5* 8.0* 8.2* 8.3*   GFR: Estimated Creatinine Clearance: 82.4 mL/min (A) (by C-G formula based on SCr of 1.73 mg/dL (H)). Liver Function Tests: No results for input(s): AST, ALT, ALKPHOS, BILITOT, PROT, ALBUMIN in the last 168 hours. No results for input(s): LIPASE, AMYLASE in the last 168 hours. No results for input(s): AMMONIA in the last 168 hours. Coagulation Profile: Recent Labs  Lab 07/02/21 0504 07/03/21 0007 07/04/21 0118 07/05/21 0259 07/06/21 0157  INR 1.7* 2.3* 1.8* 1.6* 1.4*   Cardiac Enzymes: No results for input(s): CKTOTAL, CKMB, CKMBINDEX, TROPONINI in the last 168 hours. BNP (last 3 results) No results for input(s): PROBNP in the last 8760 hours. HbA1C: No results for input(s): HGBA1C in the last 72 hours. CBG: Recent Labs  Lab 07/05/21 1116 07/05/21 1615 07/05/21 2108 07/06/21 0812 07/06/21 1125  GLUCAP 177* 162* 130* 97 145*   Lipid Profile: No results for input(s): CHOL, HDL, LDLCALC, TRIG, CHOLHDL, LDLDIRECT in the last 72 hours. Thyroid Function Tests: No results for input(s): TSH, T4TOTAL, FREET4, T3FREE, THYROIDAB in the last 72 hours. Anemia Panel: No results for input(s): VITAMINB12, FOLATE, FERRITIN, TIBC, IRON, RETICCTPCT in the last 72 hours. Urine analysis:    Component Value Date/Time   COLORURINE YELLOW 04/03/2020 0228   APPEARANCEUR CLEAR 04/03/2020 0228   LABSPEC 1.018 04/03/2020 0228   PHURINE 5.0 04/03/2020 0228   GLUCOSEU NEGATIVE 04/03/2020 0228   HGBUR NEGATIVE 04/03/2020 0228   BILIRUBINUR NEGATIVE 04/03/2020 0228   KETONESUR 5 (A) 04/03/2020 0228   PROTEINUR NEGATIVE 04/03/2020 0228   UROBILINOGEN 0.2 12/30/2014 2032   NITRITE NEGATIVE 04/03/2020 0228   LEUKOCYTESUR NEGATIVE 04/03/2020 0228   Sepsis Labs: @LABRCNTIP (procalcitonin:4,lacticidven:4)  ) Recent Results (from the past 240 hour(s))  Surgical pcr screen     Status: None   Collection Time: 07/01/21  6:22 PM   Specimen: Nasal Mucosa; Nasal  Swab  Result Value Ref Range Status   MRSA, PCR NEGATIVE NEGATIVE Final   Staphylococcus aureus NEGATIVE NEGATIVE Final    Comment: (NOTE) The Xpert SA Assay (FDA approved for NASAL specimens in patients 15 years of age and older), is one component of a comprehensive surveillance program. It is not intended to diagnose infection nor to guide or monitor treatment. Performed at [facility], [place] [address], [place], [place] [address]   Aerobic/Anaerobic Culture w Gram Stain (surgical/deep wound)     Status: Abnormal (Preliminary result)   Collection  Time: 07/01/21  7:39 PM   Specimen: Wound  Result Value Ref Range Status   Specimen Description   Final    WOUND KNEE LEFT Performed at [facility] [address] [place], [place] [address]    Special Requests   Final    NONE Performed at [facility], [place] [address], [place], [place] [address]    Gram Stain   Final    NO ORGANISMS SEEN SQUAMOUS EPITHELIAL CELLS PRESENT MODERATE WBC PRESENT,BOTH PMN AND MONONUCLEAR MODERATE GRAM NEGATIVE RODS FEW GRAM POSITIVE COCCI    Culture (A)  Final    MULTIPLE ORGANISMS PRESENT, NONE PREDOMINANT NO STAPHYLOCOCCUS AUREUS ISOLATED NO GROUP A STREP (S.PYOGENES) ISOLATED NO ANAEROBES ISOLATED Performed at [facility], [place] [address], [place], [place] [address]    Report Status PENDING  Incomplete      Studies: No results found.  Scheduled Meds:  (feeding supplement) PROSource Plus  30 mL Oral BID BM   amLODipine  10 mg Oral Daily   cephALEXin  250 mg Oral QHS   docusate sodium  100 mg Oral BID   feeding supplement (GLUCERNA SHAKE)  237 mL Oral Q24H   FLUoxetine  40 mg Oral QPM   insulin aspart  0-20 Units Subcutaneous TID WC   insulin aspart  0-5 Units Subcutaneous QHS   insulin aspart  3 Units Subcutaneous TID WC   insulin glargine-yfgn  55 Units Subcutaneous QHS   methocarbamol  1,000 mg Oral BID   multivitamin  with minerals  1 tablet Oral Daily   pantoprazole  40 mg Oral Daily   pioglitazone  45 mg Oral Daily   polyethylene glycol  17 g Oral BID   Ensure Max Protein  11 oz Oral Daily   senna  1 tablet Oral Daily   tamsulosin  0.4 mg Oral Daily    Continuous Infusions:  sodium chloride 75 mL/hr at 07/06/21 0841   heparin 2,600 Units/hr (07/06/21 1433)   methocarbamol (ROBAXIN) IV 500 mg (07/01/21 2138)     LOS: 24 days     Kayleen Memos, MD [facility] Pager [phone]  If 7PM-7AM, please contact night-coverage [email or web address] Password [email or web address] 07/06/2021, 3:36 PM

## 2021-07-06 NOTE — Progress Notes (Signed)
  ANTICOAGULATION CONSULT NOTE - Follow Up Consult  Pharmacy Consult for Heparin Indication: h/o pulmonary embolus  Allergies  Allergen Reactions   Tape     Peels skin on legs   Chlorhexidine Rash    Wipes    Patient Measurements: Height: 6\' 1"  (185.4 cm) Weight: (!) 218 kg (480 lb 9.6 oz) IBW/kg (Calculated) : 79.9 Heparin Dosing Weight: 132.5 kg  Vital Signs: Temp: 98.7 F (37.1 C) (10/10 0812) Temp Source: Oral (10/10 0812) BP: 146/42 (10/10 0812) Pulse Rate: 80 (10/10 0812)  Labs: Recent Labs    07/04/21 0118 07/04/21 1435 07/05/21 0259 07/05/21 1424 07/06/21 0157  HGB 8.1*  --  8.5*  --  8.2*  HCT 27.1*  --  27.5*  --  26.6*  PLT 294  --  269  --  260  LABPROT 21.2*  --  19.0*  --  16.9*  INR 1.8*  --  1.6*  --  1.4*  HEPARINUNFRC 0.29*   < > 0.26* 0.60 0.49  CREATININE 2.37*  --  2.09*  --  1.73*   < > = values in this interval not displayed.     Estimated Creatinine Clearance: 82.4 mL/min (A) (by C-G formula based on SCr of 1.73 mg/dL (H)).   Medications:  PTA Warfarin:  5 mg on Sun/Mon/Wed/Fri evening, and 7.5 mg on Tue/Thur/Sat evening.   INR was supratherapeutic (6.2) at admission  Assessment: 9 YOM on lifelong warfarin therapy for hx of PE, body habitus, and sedentary lifestyle admitted on 9/15 with left knee dislocation and effusion concerning for hematoma. At admission, warfarin was held, vitamin K was given for supratherapeutic INR, and pt was transfused.  Most recent pharmacy consult to begin heparin when INR decreased to < 2 while held for surgery. Heparin IV began on 10/5 with an INR of 1.7.   Significant Events: Warfarin held 9/15-9/22 Vitamin K given 9/15-9/17 Warfarin resumed 9/23 with heparin bridge (completed 9/30) Warfarin held 10/3 Heparin started 10/5 for INR < 2, held 10/5 PM for surgery, restarted 10/6 AM  HL 0.49 is therapeutic. INR down to 1.4. Of note, patient's HL seems to fluctuate on the same rate. H/H, plt stable. Will  continue heparin until surgery 10/12.   PTA warfarin dose: 5mg  MWFSu, 7.5mg  TuThSa   Goal of Therapy:  Heparin level 0.3-0.7 units/ml Monitor platelets by anticoagulation protocol: Yes   Plan:  Continue heparin infusion to 2600 units/hr Daily heparin level and CBC Monitor for s/sx bleeding Follow up plan after 10/12 surgery   Benetta Spar, PharmD, BCPS, BCCP Clinical Pharmacist  Please check AMION for all [place] phone numbers After 10:00 PM, call [place] [phone]

## 2021-07-06 NOTE — TOC Progression Note (Signed)
Transition of Care (TOC) - Initial/Assessment Note    Patient Details  Name: Joseph [name]. MRN: [id number] Date of Birth: 12/14/1955  [facility]) CM/SW Contact:    Milinda Antis, LCSWA Phone Number: 07/06/2021, 12:00 PM  Clinical Narrative:                 TOC following patient for any d/c planning needs once medically stable.  Patient will go to OR on 10/12.  Pending PT/OT recommendation after surgery.  Lind Covert, MSW, LCSWA   Expected Discharge Plan: Skilled Nursing Facility Barriers to Discharge: Continued Medical Work up   Patient Goals and CMS Choice        Expected Discharge Plan and Services Expected Discharge Plan: [place]                                              Prior Living Arrangements/Services                       Activities of Daily Living Home Assistive Devices/Equipment: CPAP, Eyeglasses ADL Screening (condition at time of admission) Patient's cognitive ability adequate to safely complete daily activities?: Yes Is the patient deaf or have difficulty hearing?: No Does the patient have difficulty seeing, even when wearing glasses/contacts?: No Does the patient have difficulty concentrating, remembering, or making decisions?: No Patient able to express need for assistance with ADLs?: Yes Does the patient have difficulty dressing or bathing?: Yes Independently performs ADLs?: No Communication: Independent Dressing (OT): Needs assistance Is this a change from baseline?: Pre-admission baseline Grooming: Independent Feeding: Independent Bathing: Needs assistance Is this a change from baseline?: Pre-admission baseline Toileting: Needs assistance Is this a change from baseline?: Pre-admission baseline In/Out Bed: Needs assistance Is this a change from baseline?: Pre-admission baseline Walks in Home: Needs assistance Is this a change from baseline?: Pre-admission baseline Does the patient  have difficulty walking or climbing stairs?: Yes Weakness of Legs: Both Weakness of Arms/Hands: Both  Permission Sought/Granted                  Emotional Assessment              Admission diagnosis:  Trauma [T14.90XA] Fall [W19.XXXA] Supratherapeutic INR [R79.1] Left knee dislocation [S83.105A] Left knee dislocation, initial encounter [S83.105A] Knee dislocation, left, initial encounter [S83.105A] Patient Active Problem List   Diagnosis Date Noted   Unstageable pressure ulcer of sacral region ([place]) 07/02/2021   Pressure injury of skin 06/28/2021   Aortic atherosclerosis ([place]) 06/12/2021   Coronary atherosclerosis 06/12/2021   Effusion of left knee 06/12/2021   Left knee dislocation 06/12/2021   Left knee dislocation, initial encounter 06/11/2021   Normocytic anemia 06/11/2021   Class 3 obesity ([place]) 06/11/2021   Osteomyelitis of great toe of right foot ([place]) 04/03/2020   Osteomyelitis of great toe of left foot ([place]) 04/03/2020   Acute renal failure superimposed on stage 3b chronic kidney disease ([place]) 04/03/2020   Monitoring for anticoagulant use 02/19/2020   Accumulation of fluid in tissues 02/19/2020   Anxiety state 02/19/2020   Diabetes mellitus type 2, uncontrolled 02/19/2020   Diabetes mellitus with polyneuropathy ([place]) 02/19/2020   Fibrositis 02/19/2020   Peripheral blood vessel disorder ([place]) 02/19/2020   Varicose veins of lower extremities with inflammation 02/19/2020   Acute renal insufficiency 01/03/2015   Esophageal  reflux 01/02/2015   Polyneuropathy in diabetes(357.2) 01/02/2015   Abscess of postoperative wound of abdominal wall 01/02/2015   Cellulitis and abscess of trunk 12/31/2014   S/P IVC filter-temporary REMOVED    Status post panniculectomy Oct 2015 07/17/2014   Morbid obesity BMI 65 03/22/2014   Pure hypercholesterolemia 03/22/2014   Essential hypertension, benign 03/22/2014   Diabetes ([place]) 03/22/2014   Panniculitis 03/22/2014   OSA  (obstructive sleep apnea) 03/22/2014   Hx pulmonary embolism 03/22/2014   Diabetic neuropathy ([place]) 02/20/2014   PCP:  Donald Prose, MD Pharmacy:   [facility] [place], [place] [address] Phone: [phone] Fax: [phone]     Social Determinants of Health (SDOH) Interventions    Readmission Risk Interventions No flowsheet data found.

## 2021-07-06 NOTE — Progress Notes (Signed)
  Subjective: Patient is a 65 year old male who is POD 5 s/p left knee reduction with external fixator placement and hematoma evacuation.  Reports he is doing well overall though he is uncomfortable with his bed which is in the process of being changed.  Main complaint is left thigh pain.   Objective: Vital signs in last 24 hours: Temp:  [97.9 F (36.6 C)-99 F (37.2 C)] 97.9 F (36.6 C) (10/10 2120) Pulse Rate:  [73-80] 78 (10/10 2120) Resp:  [15-17] 17 (10/10 2120) BP: (129-163)/(41-45) 163/41 (10/10 2120) SpO2:  [95 %-97 %] 97 % (10/10 2120)  Intake/Output from previous day: 10/09 0701 - 10/10 0700 In: 1611 [P.O.:360; I.V.:1151; IV Piggyback:100] Out: 1700 [Urine:1600; Drains:100] Intake/Output this shift: Total I/O In: -  Out: 600 [Urine:600]  Exam:  Pin sites are without significant erythema or expressible drainage.  No compromise of the hardware.  There is no evidence of pressure wound where the pannus lays upon the proximal end of the external fixator.  Intact dorsiflexion and plantarflexion to the operative extremity.  Labs: Recent Labs    07/04/21 0118 07/05/21 0259 07/06/21 0157  HGB 8.1* 8.5* 8.2*   Recent Labs    07/05/21 0259 07/06/21 0157  WBC 6.4 5.6  RBC 2.96* 2.84*  HCT 27.5* 26.6*  PLT 269 260   Recent Labs    07/05/21 0259 07/06/21 0157  NA 132* 133*  K 5.2* 5.0  CL 101 104  CO2 23 22  BUN 68* 67*  CREATININE 2.09* 1.73*  GLUCOSE 181* 132*  CALCIUM 8.2* 8.3*   Recent Labs    07/05/21 0259 07/06/21 0157  INR 1.6* 1.4*    Assessment/Plan: Patient is POD 5 s/p left knee external fixator placement with reduction and hematoma evacuation.  Pin sites look good today.  Plan for continuation of the external fixator and Dr. Sharol Given to take him back to the OR on Wednesday for repeat I&D.   Benedicta Sultan L Viraj Liby 07/06/2021, 9:30 PM

## 2021-07-06 NOTE — Progress Notes (Signed)
Physical Therapy Treatment Patient Details Name: Joseph [name]. MRN: [id number] DOB: Nov 12, 1955 Today's Date: 07/06/2021   History of Present Illness Cristen Murcia is a 65 yo male who presnets with increased LLE swelling after recent fall at home. 9/15 pt fell when getting off x-ray table at hospital. X-rays showed a knee dislocation which was confirmed on CT scan as a posterior lateral dislocation; L knee reduced and L KI placed. Lumbar and pelvic xrays negative. 10/5: left knee dislocation reduction with external fixation placement and extensive debridement of hematoma and devitalized skin subtenons tissue muscle fascia.  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    PT Comments    Continuing work on functional mobility and activity tolerance;  Now post L knee external fixation, strict NWB continues; Session focused on  getting OOB today via maxi sky to see how he tolerated it with new left leg long external fixator. He tolerated relatively well with plan only to sit up a short time. The weight and make of the external fixator makes it so much more difficult for him to [facility] himself. He is motivated to do as much for himself as he can. Kreg tilt bed has been ordered  Recommendations for follow up therapy are one component of a multi-disciplinary discharge planning process, led by the attending physician.  Recommendations may be updated based on patient status, additional functional criteria and insurance authorization.  Follow Up Recommendations  SNF;CIR     Equipment Recommendations  Wheelchair (measurements PT);Wheelchair cushion (measurements PT);Hospital bed (Bariatric Wheelchair, perhaps drop-arm bariatric commode; will depende on progress)    Recommendations for Other Services       Precautions / Restrictions Precautions Precautions: Fall Precaution Comments: NO knee ROM on L-STRICT Restrictions Weight Bearing  Restrictions: Yes LLE Weight Bearing: Non weight bearing     Mobility  Bed Mobility Overal bed mobility: Needs Assistance Bed Mobility: Rolling Rolling: Total assist (+3 with use of bed pads--on air matteress bed)         General bed mobility comments: Pt in air mattress bed that gives him little room to [facility]. Pt now also with external fixator. Pt can use UB to help intiate turn to left and right and RLE to help turn to right--needs A for trunk/panus, and LLE.    Transfers Overall transfer level: Needs assistance               General transfer comment: Performed dependent mechanical lift transfer with 3 person assist; One person's job to to fully support LLE during transition, other 2 for lift and equiment  Ambulation/Gait                 Stairs             Wheelchair Mobility    Modified Rankin (Stroke Patients Only)       Balance     Sitting balance-Leahy Scale: Fair       Standing balance-Leahy Scale: Fair                              Cognition Arousal/Alertness: Awake/alert Behavior During Therapy: WFL for tasks assessed/performed Overall Cognitive Status: Within Functional Limits for tasks assessed  General Comments: wife was present during session      Exercises      General Comments General comments (skin integrity, edema, etc.): Extra time for positioning, and to assess tolerance with inital use of Maxisky 600      Pertinent Vitals/Pain Pain Assessment: 0-10 Pain Score: 10-Worst pain ever Pain Location: Left knee with movement in bed and from bed>recliner Pain Descriptors / Indicators: Grimacing;Aching;Sore;Discomfort;Guarding Pain Intervention(s): Monitored during session    Home Living                      Prior Function            PT Goals (current goals can now be found in the care plan section) Acute Rehab PT Goals Patient Stated Goal: to help  move as much as possible PT Goal Formulation: With patient/family Time For Goal Achievement: 07/20/21 Potential to Achieve Goals: Good Progress towards PT goals: Progressing toward goals (Goals updated postop)    Frequency    Min 3X/week      PT Plan Current plan remains appropriate    Co-evaluation PT/OT/SLP Co-Evaluation/Treatment: Yes Reason for Co-Treatment: For patient/therapist safety PT goals addressed during session: Mobility/safety with mobility OT goals addressed during session: Strengthening/ROM      AM-PAC PT "6 Clicks" Mobility   Outcome Measure  Help needed turning from your back to your side while in a flat bed without using bedrails?: A Lot Help needed moving from lying on your back to sitting on the side of a flat bed without using bedrails?: A Lot Help needed moving to and from a bed to a chair (including a wheelchair)?: Total Help needed standing up from a chair using your arms (e.g., wheelchair or bedside chair)?: Total Help needed to walk in hospital room?: Total Help needed climbing 3-5 steps with a railing? : Total 6 Click Score: 8    End of Session   Activity Tolerance: Patient tolerated treatment well;Other (comment) (Excellent particiaption and tolerance of transfer despite pain) Patient left: in chair;with call bell/phone within reach;with family/visitor present Nurse Communication: Need for lift equipment PT Visit Diagnosis: Other abnormalities of gait and mobility (R26.89);Muscle weakness (generalized) (M62.81);History of falling (Z91.81);Repeated falls (R29.6);Pain Pain - Right/Left: Left Pain - part of body: Knee;Leg     Time: 1030-1130 PT Time Calculation (min) (ACUTE ONLY): 60 min  Charges:  $Therapeutic Activity: 23-37 mins                     Roney Marion, PT  Acute Rehabilitation Services Pager [phone] Office [phone]    Colletta [place] 07/06/2021, 2:47 PM

## 2021-07-06 NOTE — Progress Notes (Signed)
Nutrition Follow-up  DOCUMENTATION CODES:   Morbid obesity  INTERVENTION:   Continue ProSource Plus BID.   Continue Glucerna shake daily.   Continue Ensure Max daily.   Continue MVI with minerals daily.  NUTRITION DIAGNOSIS:   Inadequate oral intake related to decreased appetite as evidenced by per patient/family report.  Ongoing  GOAL:   Patient will meet greater than or equal to 90% of their needs  Progressing   MONITOR:   PO intake, Supplement acceptance, Labs, Weight trends, Skin  REASON FOR ASSESSMENT:   Consult Assessment of nutrition requirement/status  ASSESSMENT:   65 y.o. male with medical history of morbid obesity, anxiety, depression, hx L knee DVT, PE (on chronic Coumadin), GERD, HTN, IDA, cervical spine stenosis, non-specific myalgias/myositis, OSA on CPAP, PVD, polyneuropathy, type 2 DM, HLD, and multiple falls. He presented to the ED due to a fall on 9/15. He was admitted for pain control and to monitor L leg for development of compartment syndrome.  10/6- s/p PROCEDURES:  1.  Left knee extensive debridement using scalpel, curette, and rongeur of devitalized skin, subcutaneous tissue, muscle and fascia. 2.  Reduction of knee dislocation. 3.  External fixation spanning of knee dislocation. 4.  Application of wound VAC over an area 23 x 18 cm.  Reviewed I/O's: -89 ml x x 24 hours and -2.4 L since 06/22/21  UOP: 1.6 L x 24 hours  Drain output: 100 ml x 24 hours  Pt sleeping soundly at time of visit. RD did not disturb.   Per orthopedics notes, plan to return to the OR on Wednesday, 07/08/2021.   Pt remains with good appetite. Noted meal completions 50-100%. He has been taking his supplements.   Medications reviewed and include colace, miralax, senokot, and 0.9% sodium chloride infusion @ 75 ml/hr.   Labs reviewed: Na: 133, CBGS: 97-162 (inpatient orders for glycemic control are 45 mg pioglitazone daily, 0-20 units insulin aspart TID with meals,  0-5 units insulin aspart daily at bedtime, 3 units insulin aspart TID with meals, and 55 units insulin glargine-yfgn daily at bedtime).    Diet Order:   Diet Order             Diet Carb Modified Fluid consistency: Thin; Room service appropriate? Yes  Diet effective now                   EDUCATION NEEDS:   No education needs have been identified at this time  Skin:  Skin Assessment: Skin Integrity Issues: Skin Integrity Issues:: Other (Comment), Stage II, Wound VAC Stage II: nose Wound Vac: lt leg Other: venous stasis ulcers to rt and lt pretibial areas; open blister to lt anterior thigh; skin tear to rt buttock  Last BM:  07/04/21  Height:   Ht Readings from Last 1 Encounters:  07/01/21 6\' 1"  (1.854 m)    Weight:   Wt Readings from Last 1 Encounters:  07/01/21 (!) 218 kg    Ideal Body Weight:  83.6 kg  BMI:  Body mass index is 63.41 kg/m.  Estimated Nutritional Needs:   Kcal:  2300-2500 kcal  Protein:  115-135 grams  Fluid:  >/= 2.4 L/day    Loistine Chance, RD, LDN, CDCES Registered Dietitian II Certified Diabetes Care and Education Specialist Please refer to [facility] for RD and/or RD on-call/weekend/after hours pager

## 2021-07-06 NOTE — Progress Notes (Signed)
Director Manuela Schwartz made aware of patient's request for Kreg bed. Phone call placed, waiting to hear back if that bed is allowed to be used on a Med-Surg floor.

## 2021-07-07 ENCOUNTER — Other Ambulatory Visit: Payer: Self-pay | Admitting: Family

## 2021-07-07 ENCOUNTER — Other Ambulatory Visit: Payer: Self-pay | Admitting: Physician Assistant

## 2021-07-07 DIAGNOSIS — L97824 Non-pressure chronic ulcer of other part of left lower leg with necrosis of bone: Secondary | ICD-10-CM

## 2021-07-07 DIAGNOSIS — S83105A Unspecified dislocation of left knee, initial encounter: Secondary | ICD-10-CM | POA: Diagnosis not present

## 2021-07-07 DIAGNOSIS — Z9189 Other specified personal risk factors, not elsewhere classified: Secondary | ICD-10-CM

## 2021-07-07 LAB — PROTIME-INR
INR: 1.2 (ref 0.8–1.2)
Prothrombin Time: 15.6 seconds — ABNORMAL HIGH (ref 11.4–15.2)

## 2021-07-07 LAB — CBC
HCT: 28.9 % — ABNORMAL LOW (ref 39.0–52.0)
Hemoglobin: 8.6 g/dL — ABNORMAL LOW (ref 13.0–17.0)
MCH: 28.4 pg (ref 26.0–34.0)
MCHC: 29.8 g/dL — ABNORMAL LOW (ref 30.0–36.0)
MCV: 95.4 fL (ref 80.0–100.0)
Platelets: 263 10*3/uL (ref 150–400)
RBC: 3.03 MIL/uL — ABNORMAL LOW (ref 4.22–5.81)
RDW: 16.4 % — ABNORMAL HIGH (ref 11.5–15.5)
WBC: 5.3 10*3/uL (ref 4.0–10.5)
nRBC: 0 % (ref 0.0–0.2)

## 2021-07-07 LAB — BASIC METABOLIC PANEL
Anion gap: 7 (ref 5–15)
BUN: 61 mg/dL — ABNORMAL HIGH (ref 8–23)
CO2: 24 mmol/L (ref 22–32)
Calcium: 8.8 mg/dL — ABNORMAL LOW (ref 8.9–10.3)
Chloride: 106 mmol/L (ref 98–111)
Creatinine, Ser: 1.35 mg/dL — ABNORMAL HIGH (ref 0.61–1.24)
GFR, Estimated: 59 mL/min — ABNORMAL LOW (ref >60)
Glucose, Bld: 107 mg/dL — ABNORMAL HIGH (ref 70–99)
Potassium: 4.9 mmol/L (ref 3.5–5.1)
Sodium: 137 mmol/L (ref 135–145)

## 2021-07-07 LAB — GLUCOSE, CAPILLARY
Glucose-Capillary: 105 mg/dL — ABNORMAL HIGH (ref 70–99)
Glucose-Capillary: 125 mg/dL — ABNORMAL HIGH (ref 70–99)
Glucose-Capillary: 122 mg/dL — ABNORMAL HIGH (ref 70–99)
Glucose-Capillary: 164 mg/dL — ABNORMAL HIGH (ref 70–99)

## 2021-07-07 LAB — HEPARIN LEVEL (UNFRACTIONATED): Heparin Unfractionated: 0.49 IU/mL (ref 0.30–0.70)

## 2021-07-07 LAB — PHOSPHORUS: Phosphorus: 3.2 mg/dL (ref 2.5–4.6)

## 2021-07-07 LAB — MAGNESIUM: Magnesium: 2.4 mg/dL (ref 1.7–2.4)

## 2021-07-07 MED ORDER — SODIUM CHLORIDE 0.9 % IV SOLN: INTRAVENOUS | Status: AC

## 2021-07-07 MED ORDER — JUVEN PO PACK
1.0000 | PACK | Freq: Two times a day (BID) | ORAL | Status: DC
  Administered 2021-07-07: 1 via ORAL
  Filled 2021-07-07: qty 1

## 2021-07-07 NOTE — H&P (View-Only) (Signed)
ORTHOPAEDIC CONSULTATION  REQUESTING PHYSICIAN: Kayleen Memos, DO  Chief Complaint: Traumatic dislocation left knee with associated soft tissue necrosis.  HPI: Joseph [name]. is a 65 y.o. male who presents with ground-level fall sustaining a left knee dislocation.  This was initially reduced subsequently dislocated again and underwent external fixation for reduction.  Due to the dislocation patient has developed soft tissue trauma.  Past Medical History:  Diagnosis Date   Anxiety state, unspecified    Aortic atherosclerosis ([place]) 06/12/2021   Depression    DVT (deep venous thrombosis) ([place]) 2004   Left knee   Edema    Esophageal reflux    occ   Essential hypertension, benign    Essential hypertension, benign    Falls frequently    History of iron deficiency anemia    Morbid obesity (HCC)    Myalgia and myositis, unspecified    BACK AND LEGS   OSA on CPAP    Peripheral vascular disease, unspecified ([place])    Personal history of PE (pulmonary embolism) 2004   Polyneuropathy in diabetes(357.2)    Poor venous access    PT STATES DIFFICULT TO DRAW BLOOD FROM HIS VEINS   Pure hypercholesterolemia    Shortness of breath    DUE TO WEIGHT AND LOSS OF MOBILITY   Type II or unspecified type diabetes mellitus with neurological manifestations, not stated as uncontrolled(250.60)    Type II or unspecified type diabetes mellitus without mention of complication, not stated as uncontrolled    Type II or unspecified type diabetes mellitus without mention of complication, uncontrolled    Varicose veins of lower extremities with inflammation    Wears glasses    Wound infection after surgery    required hospitalization   Past Surgical History:  Procedure Laterality Date   AMPUTATION TOE Bilateral 04/03/2020   Procedure: BILATERAL HALLUX AMPUTATION;  Surgeon: Felipa Furnace, DPM;  Location: [facility];  Service: Podiatry;  Laterality: Bilateral;   COLONOSCOPY WITH PROPOFOL N/A 07/20/2019    Procedure: COLONOSCOPY WITH PROPOFOL;  Surgeon: Wonda Horner, MD;  Location: [facility];  Service: Endoscopy;  Laterality: N/A;   HEMATOMA EVACUATION Left 07/01/2021   Procedure: LEFT KNEE REDUCTION EXTERNAL FIXATION WITH EVACUATION HEMATOMA;  Surgeon: Meredith Pel, MD;  Location: [facility];  Service: Orthopedics;  Laterality: Left;   I & D EXTREMITY Bilateral 04/05/2020   Procedure: IRRIGATION AND DEBRIDEMENT EXTREMITY WITH CLOSURE - BILATERALLY;  Surgeon: Felipa Furnace, DPM;  Location: [facility];  Service: Podiatry;  Laterality: Bilateral;   NO PAST SURGERIES     PANNICULECTOMY N/A 07/15/2014   Procedure: PANNICULECTOMY;  Surgeon: Pedro Earls, MD;  Location: [facility];  Service: General;  Laterality: N/A;   POLYPECTOMY  07/20/2019   Procedure: POLYPECTOMY;  Surgeon: Wonda Horner, MD;  Location: [facility];  Service: Endoscopy;;   Social History   Socioeconomic History   Marital status: Married    Spouse name: Not on file   Number of children: Not on file   Years of education: Not on file   Highest education level: Not on file  Occupational History   Not on file  Tobacco Use   Smoking status: Never   Smokeless tobacco: Never  Vaping Use   Vaping Use: Never used  Substance and Sexual Activity   Alcohol use: Not Currently   Drug use: No   Sexual activity: Not on file  Other Topics Concern   Not on file  Social History Narrative  Not on file   Social Determinants of Health   Financial Resource Strain: Not on file  Food Insecurity: Not on file  Transportation Needs: Not on file  Physical Activity: Not on file  Stress: Not on file  Social Connections: Not on file   Family History  Problem Relation Age of Onset   Hypertension Father    Pulmonary embolism Father    Hypertension Other    - negative except otherwise stated in the family history section Allergies  Allergen Reactions   Tape     Peels skin on legs   Chlorhexidine Rash    Wipes   Prior to  Admission medications   Medication Sig Start Date End Date Taking? Authorizing Provider  acetaminophen (TYLENOL) 500 MG tablet Take 1,000 mg by mouth daily as needed for moderate pain or headache.    Yes [provider]  amLODipine (NORVASC) 10 MG tablet Take 10 mg by mouth every evening.    Yes [provider]  cephALEXin (KEFLEX) 250 MG capsule Take 250 mg by mouth at bedtime. 06/05/21  Yes [provider]  FLUoxetine (PROZAC) 40 MG capsule Take 40 mg by mouth every evening.    Yes [provider]  furosemide (LASIX) 40 MG tablet Take 40 mg by mouth daily.    Yes [provider]  indapamide (LOZOL) 2.5 MG tablet Take 5 mg by mouth daily.   Yes [provider]  insulin aspart (NOVOLOG) 100 UNIT/ML injection Inject 0-25 Units into the skin as needed for high blood sugar. Sliding Scale 06/08/21  Yes [provider]  LANTUS 100 UNIT/ML injection Inject 50 Units into the skin at bedtime. 06/08/21  Yes [provider]  metFORMIN (GLUCOPHAGE) 1000 MG tablet Take 1,000 mg by mouth 2 (two) times daily with a meal.   Yes [provider]  omeprazole (PRILOSEC) 40 MG capsule Take 40 mg by mouth daily as needed (acid reflux).    Yes [provider]  pioglitazone (ACTOS) 45 MG tablet Take 45 mg by mouth daily.   Yes [provider]  pravastatin (PRAVACHOL) 40 MG tablet Take 40 mg by mouth every evening.    Yes [provider]  Prenatal Vit-Fe Fumarate-FA (PRENATAL FA PO) Take 1 tablet by mouth daily.   Yes [provider]  quinapril (ACCUPRIL) 40 MG tablet Take 40 mg by mouth daily.    Yes [provider]  silver sulfADIAZINE (SILVADENE) 1 % cream Apply 1 application topically daily. Patient taking differently: Apply 1 application topically daily as needed (wounds on toes and legs). 02/26/20  Yes Early, Arvilla Meres, MD  tamsulosin (FLOMAX) 0.4 MG CAPS capsule Take 0.4 mg by mouth daily. 04/02/21   Yes [provider]  warfarin (COUMADIN) 5 MG tablet Take 5-7.5 mg by mouth See admin instructions. Take 5 mg in the evening on Sun, Mon, Wed, and Fri. Take 7.5 mg in the evening on Tue, Thur, and Sat   Yes [provider]  Wheat Dextrin (BENEFIBER) POWD Take 15 mLs by mouth daily as needed (constipation). Mix in coffee and drink   Yes [provider]  Continuous Blood Gluc Sensor (FREESTYLE LIBRE 2 SENSOR) MISC APPLY EVERY 14 DAYS AS DIRECTED 03/28/20   [provider]  FREESTYLE LITE test strip TEST BLOOD SUGAR FOUR TIMES DAILY 02/11/20   [provider]   No results found. - pertinent xrays, CT, MRI studies were reviewed and independently interpreted  Positive ROS: All other systems have  been reviewed and were otherwise negative with the exception of those mentioned in the HPI and as above.  Physical Exam: General: Alert, no acute distress Psychiatric: Patient is competent for consent with normal mood and affect Lymphatic: No axillary or cervical lymphadenopathy Cardiovascular: No pedal edema Respiratory: No cyanosis, no use of accessory musculature GI: No organomegaly, abdomen is soft and non-tender    Images:  @ENCIMAGES @  Labs:  Lab Results  Component Value Date   HGBA1C 6.1 (H) 06/18/2021   HGBA1C 7.5 (H) 04/03/2020   HGBA1C 7.4 (H) 12/31/2014   REPTSTATUS 07/06/2021 FINAL 07/01/2021   GRAMSTAIN  07/01/2021    NO ORGANISMS SEEN SQUAMOUS EPITHELIAL CELLS PRESENT MODERATE WBC PRESENT,BOTH PMN AND MONONUCLEAR MODERATE GRAM NEGATIVE RODS FEW GRAM POSITIVE COCCI    CULT (A) 07/01/2021    MULTIPLE ORGANISMS PRESENT, NONE PREDOMINANT NO STAPHYLOCOCCUS AUREUS ISOLATED NO GROUP A STREP (S.PYOGENES) ISOLATED NO ANAEROBES ISOLATED Performed at [facility], [place] [address] [place], [place] [address]    LABORGA PROTEUS MIRABILIS 03/24/2020   LABORGA PSEUDOMONAS AERUGINOSA 03/24/2020    Lab Results  Component Value Date    ALBUMIN 2.4 (L) 06/24/2021   ALBUMIN 2.8 (L) 06/11/2021   ALBUMIN 3.1 (L) 04/05/2020   PREALBUMIN 16.9 (L) 04/03/2020     CBC EXTENDED Latest Ref Rng & Units 07/07/2021 07/06/2021 07/05/2021  WBC 4.0 - 10.5 K/uL 5.3 5.6 6.4  RBC 4.22 - 5.81 MIL/uL 3.03(L) 2.84(L) 2.96(L)  HGB 13.0 - 17.0 g/dL 8.6(L) 8.2(L) 8.5(L)  HCT 39.0 - 52.0 % 28.9(L) 26.6(L) 27.5(L)  PLT 150 - 400 K/uL 263 260 269  NEUTROABS 1.7 - 7.7 K/uL - - -  LYMPHSABS 0.7 - 4.0 K/uL - - -    Neurologic: Patient does not have protective sensation bilateral lower extremities.   MUSCULOSKELETAL:   Skin: Patient has necrotic blistering over the medial left knee and venous ulceration to the left leg.  Patient's white blood cell count is 5.3 hemoglobin 8.6.  Albumin 2.4 with a prealbumin of 16.9.  Hemoglobin A1c 6.1.  Patient has palpable pulses bilaterally.  Patient is status post a right great toe amputation at [facility].  Patient was previously treated for wound care for the right great toe at the wound center.  Patient has venous stasis swelling of the right lower extremity with ulceration.  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  Plan: Plan: We will plan for serial debridement for the ulcerative skin on the left knee.  Patient will need compression stockings for the right lower extremity and I will place orders for this.  Anticipate return to the operating room on Friday.    Thank you for the consult and the opportunity to see Mr. Idelia Salm, MD [facility] [phone] 9:40 AM

## 2021-07-07 NOTE — TOC Progression Note (Signed)
Transition of Care (TOC) - Initial/Assessment Note    Patient Details  Name: Joseph [name]. MRN: [id number] Date of Birth: August 27, 1956  Transition of Care [facility]) CM/SW Contact:    Milinda Antis, [place] Phone Number: 07/07/2021, 5:20 PM  Clinical Narrative:                 CSW met with the patient at bedside. The patient was oriented x4 and pleasant.  The patient reported that he is aware that a SNF recommendation was made while he was at [facility] and that his weight is a barrier.  The patient reported being able to walk with the assistance of a walker prior to admission.  The patient is from home with his wife and is hopeful that he can attend the inpatient rehab at [facility].  If not, the patient reports wanting to receive rehab in the hospital on the floor or a facility and return home.    The patient has surgery scheduled for 10/12 and 10/14.  Expected Discharge Plan: Skilled Nursing Facility Barriers to Discharge: Continued Medical Work up   Patient Goals and CMS Choice        Expected Discharge Plan and Services Expected Discharge Plan: [place]                                              Prior Living Arrangements/Services                       Activities of Daily Living Home Assistive Devices/Equipment: CPAP, Eyeglasses ADL Screening (condition at time of admission) Patient's cognitive ability adequate to safely complete daily activities?: Yes Is the patient deaf or have difficulty hearing?: No Does the patient have difficulty seeing, even when wearing glasses/contacts?: No Does the patient have difficulty concentrating, remembering, or making decisions?: No Patient able to express need for assistance with ADLs?: Yes Does the patient have difficulty dressing or bathing?: Yes Independently performs ADLs?: No Communication: Independent Dressing (OT): Needs assistance Is this a change from baseline?: Pre-admission  baseline Grooming: Independent Feeding: Independent Bathing: Needs assistance Is this a change from baseline?: Pre-admission baseline Toileting: Needs assistance Is this a change from baseline?: Pre-admission baseline In/Out Bed: Needs assistance Is this a change from baseline?: Pre-admission baseline Walks in Home: Needs assistance Is this a change from baseline?: Pre-admission baseline Does the patient have difficulty walking or climbing stairs?: Yes Weakness of Legs: Both Weakness of Arms/Hands: Both  Permission Sought/Granted                  Emotional Assessment              Admission diagnosis:  Trauma [T14.90XA] Fall [W19.XXXA] Supratherapeutic INR [R79.1] Left knee dislocation [S83.105A] Left knee dislocation, initial encounter [S83.105A] Knee dislocation, left, initial encounter [S83.105A] Patient Active Problem List   Diagnosis Date Noted   At risk for hemorrhage associated with anticoagulation therapy    Ulcer of left knee, with necrosis of bone ([place])    Unstageable pressure ulcer of sacral region ([place]) 07/02/2021   Pressure injury of skin 06/28/2021   Aortic atherosclerosis ([place]) 06/12/2021   Coronary atherosclerosis 06/12/2021   Effusion of left knee 06/12/2021   Left knee dislocation 06/12/2021   Left knee dislocation, initial encounter 06/11/2021   Normocytic anemia 06/11/2021   Class 3  obesity ([place]) 06/11/2021   Osteomyelitis of great toe of right foot ([place]) 04/03/2020   Osteomyelitis of great toe of left foot ([place]) 04/03/2020   Acute renal failure superimposed on stage 3b chronic kidney disease ([place]) 04/03/2020   Monitoring for anticoagulant use 02/19/2020   Accumulation of fluid in tissues 02/19/2020   Anxiety state 02/19/2020   Diabetes mellitus type 2, uncontrolled 02/19/2020   Diabetes mellitus with polyneuropathy ([place]) 02/19/2020   Fibrositis 02/19/2020   Peripheral blood vessel disorder ([place]) 02/19/2020   Varicose veins of lower  extremities with inflammation 02/19/2020   Acute renal insufficiency 01/03/2015   Esophageal reflux 01/02/2015   Polyneuropathy in diabetes(357.2) 01/02/2015   Abscess of postoperative wound of abdominal wall 01/02/2015   Cellulitis and abscess of trunk 12/31/2014   S/P IVC filter-temporary REMOVED    Status post panniculectomy Oct 2015 07/17/2014   Morbid obesity BMI 65 03/22/2014   Pure hypercholesterolemia 03/22/2014   Essential hypertension, benign 03/22/2014   Diabetes ([place]) 03/22/2014   Panniculitis 03/22/2014   OSA (obstructive sleep apnea) 03/22/2014   Hx pulmonary embolism 03/22/2014   Diabetic neuropathy ([place]) 02/20/2014   PCP:  Donald Prose, MD Pharmacy:   [facility] [place], [address] [place] [address] Phone: [phone] Fax: [phone]     Social Determinants of Health (SDOH) Interventions    Readmission Risk Interventions No flowsheet data found.

## 2021-07-07 NOTE — Progress Notes (Signed)
  ANTICOAGULATION CONSULT NOTE - Follow Up Consult  Pharmacy Consult for Heparin Indication: h/o pulmonary embolus  Allergies  Allergen Reactions   Tape     Peels skin on legs   Chlorhexidine Rash    Wipes    Patient Measurements: Height: 6\' 1"  (185.4 cm) Weight: (!) 218 kg (480 lb 9.6 oz) IBW/kg (Calculated) : 79.9 Heparin Dosing Weight: 132.5 kg  Vital Signs: Temp: 98.3 F (36.8 C) (10/11 0809) Temp Source: Oral (10/11 0809) BP: 141/42 (10/11 0809) Pulse Rate: 73 (10/11 0809)  Labs: Recent Labs    07/05/21 0259 07/05/21 1424 07/06/21 0157 07/07/21 0237 07/07/21 0744  HGB 8.5*  --  8.2*  --  8.6*  HCT 27.5*  --  26.6*  --  28.9*  PLT 269  --  260  --  263  LABPROT 19.0*  --  16.9* 15.6*  --   INR 1.6*  --  1.4* 1.2  --   HEPARINUNFRC 0.26* 0.60 0.49 0.49  --   CREATININE 2.09*  --  1.73*  --  1.35*     Estimated Creatinine Clearance: 105.6 mL/min (A) (by C-G formula based on SCr of 1.35 mg/dL (H)).   Medications:  PTA Warfarin:  5 mg on Sun/Mon/Wed/Fri evening, and 7.5 mg on Tue/Thur/Sat evening.   INR was supratherapeutic (6.2) at admission  Assessment: 60 YOM on lifelong warfarin therapy for hx of PE, body habitus, and sedentary lifestyle admitted on 9/15 with left knee dislocation and effusion concerning for hematoma. At admission, warfarin was held, vitamin K was given for supratherapeutic INR, and pt was transfused.  Most recent pharmacy consult to begin heparin when INR decreased to < 2 while held for surgery. Heparin IV began on 10/5 with an INR of 1.7.   Significant Events: Warfarin held 9/15-9/22 Vitamin K given 9/15-9/17 Warfarin resumed 9/23 with heparin bridge (completed 9/30) Warfarin held 10/3 Heparin started 10/5 for INR < 2, held 10/5 PM for surgery, restarted 10/6 AM  HL 0.49 is therapeutic. INR down to 1.2. H/H, plt stable. Will continue heparin until surgery 10/14.   PTA warfarin dose: 5mg  MWFSu, 7.5mg  TuThSa   Goal of Therapy:   Heparin level 0.3-0.7 units/ml Monitor platelets by anticoagulation protocol: Yes   Plan:  Continue heparin infusion to 2600 units/hr Daily heparin level and CBC Monitor for s/sx bleeding Follow up restart warfarin after 10/14 surgery   Benetta Spar, PharmD, BCPS, BCCP Clinical Pharmacist  Please check AMION for all [facility] phone numbers After 10:00 PM, call [place]

## 2021-07-07 NOTE — Progress Notes (Signed)
Orthopedic Tech Progress Note Patient Details:  Joseph [name] 19-Sep-1956 [id number]  Patient ID: [name]., male   DOB: 12-Nov-1955, 65 y.o.   MRN: [id number] Called in order to [facility].  [place] A Dahir Ayer 07/07/2021, 11:04 AM

## 2021-07-07 NOTE — Consult Note (Addendum)
ORTHOPAEDIC CONSULTATION  REQUESTING PHYSICIAN: Kayleen Memos, DO  Chief Complaint: Traumatic dislocation left knee with associated soft tissue necrosis.  HPI: Joseph Rosamond. is a 65 y.o. male who presents with ground-level fall sustaining a left knee dislocation.  This was initially reduced subsequently dislocated again and underwent external fixation for reduction.  Due to the dislocation patient has developed soft tissue trauma.  Past Medical History:  Diagnosis Date   Anxiety state, unspecified    Aortic atherosclerosis ([place]) 06/12/2021   Depression    DVT (deep venous thrombosis) ([place]) 2004   Left knee   Edema    Esophageal reflux    occ   Essential hypertension, benign    Essential hypertension, benign    Falls frequently    History of iron deficiency anemia    Morbid obesity ([facility])    Myalgia and myositis, unspecified    BACK AND LEGS   OSA on CPAP    Peripheral vascular disease, unspecified ([place])    Personal history of PE (pulmonary embolism) 2004   Polyneuropathy in diabetes(357.2)    Poor venous access    PT STATES DIFFICULT TO DRAW BLOOD FROM HIS VEINS   Pure hypercholesterolemia    Shortness of breath    DUE TO WEIGHT AND LOSS OF MOBILITY   Type II or unspecified type diabetes mellitus with neurological manifestations, not stated as uncontrolled(250.60)    Type II or unspecified type diabetes mellitus without mention of complication, not stated as uncontrolled    Type II or unspecified type diabetes mellitus without mention of complication, uncontrolled    Varicose veins of lower extremities with inflammation    Wears glasses    Wound infection after surgery    required hospitalization   Past Surgical History:  Procedure Laterality Date   AMPUTATION TOE Bilateral 04/03/2020   Procedure: BILATERAL HALLUX AMPUTATION;  Surgeon: Felipa Furnace, DPM;  Location: [facility];  Service: Podiatry;  Laterality: Bilateral;   COLONOSCOPY WITH PROPOFOL N/A 07/20/2019    Procedure: COLONOSCOPY WITH PROPOFOL;  Surgeon: Wonda Horner, MD;  Location: [facility];  Service: Endoscopy;  Laterality: N/A;   HEMATOMA EVACUATION Left 07/01/2021   Procedure: LEFT KNEE REDUCTION EXTERNAL FIXATION WITH EVACUATION HEMATOMA;  Surgeon: Meredith Pel, MD;  Location: [facility];  Service: Orthopedics;  Laterality: Left;   I & D EXTREMITY Bilateral 04/05/2020   Procedure: IRRIGATION AND DEBRIDEMENT EXTREMITY WITH CLOSURE - BILATERALLY;  Surgeon: Felipa Furnace, DPM;  Location: [facility];  Service: Podiatry;  Laterality: Bilateral;   NO PAST SURGERIES     PANNICULECTOMY N/A 07/15/2014   Procedure: PANNICULECTOMY;  Surgeon: Pedro Earls, MD;  Location: [facility];  Service: General;  Laterality: N/A;   POLYPECTOMY  07/20/2019   Procedure: POLYPECTOMY;  Surgeon: Wonda Horner, MD;  Location: [facility];  Service: Endoscopy;;   Social History   Socioeconomic History   Marital status: Married    Spouse name: Not on file   Number of children: Not on file   Years of education: Not on file   Highest education level: Not on file  Occupational History   Not on file  Tobacco Use   Smoking status: Never   Smokeless tobacco: Never  Vaping Use   Vaping Use: Never used  Substance and Sexual Activity   Alcohol use: Not Currently   Drug use: No   Sexual activity: Not on file  Other Topics Concern   Not on file  Social History Narrative  Not on file   Social Determinants of Health   Financial Resource Strain: Not on file  Food Insecurity: Not on file  Transportation Needs: Not on file  Physical Activity: Not on file  Stress: Not on file  Social Connections: Not on file   Family History  Problem Relation Age of Onset   Hypertension Father    Pulmonary embolism Father    Hypertension Other    - negative except otherwise stated in the family history section Allergies  Allergen Reactions   Tape     Peels skin on legs   Chlorhexidine Rash    Wipes   Prior to  Admission medications   Medication Sig Start Date End Date Taking? Authorizing Provider  acetaminophen (TYLENOL) 500 MG tablet Take 1,000 mg by mouth daily as needed for moderate pain or headache.    Yes [provider]  amLODipine (NORVASC) 10 MG tablet Take 10 mg by mouth every evening.    Yes [provider]  cephALEXin (KEFLEX) 250 MG capsule Take 250 mg by mouth at bedtime. 06/05/21  Yes [provider]  FLUoxetine (PROZAC) 40 MG capsule Take 40 mg by mouth every evening.    Yes [provider]  furosemide (LASIX) 40 MG tablet Take 40 mg by mouth daily.    Yes [provider]  indapamide (LOZOL) 2.5 MG tablet Take 5 mg by mouth daily.   Yes [provider]  insulin aspart (NOVOLOG) 100 UNIT/ML injection Inject 0-25 Units into the skin as needed for high blood sugar. Sliding Scale 06/08/21  Yes [provider]  LANTUS 100 UNIT/ML injection Inject 50 Units into the skin at bedtime. 06/08/21  Yes [provider]  metFORMIN (GLUCOPHAGE) 1000 MG tablet Take 1,000 mg by mouth 2 (two) times daily with a meal.   Yes [provider]  omeprazole (PRILOSEC) 40 MG capsule Take 40 mg by mouth daily as needed (acid reflux).    Yes [provider]  pioglitazone (ACTOS) 45 MG tablet Take 45 mg by mouth daily.   Yes [provider]  pravastatin (PRAVACHOL) 40 MG tablet Take 40 mg by mouth every evening.    Yes [provider]  Prenatal Vit-Fe Fumarate-FA (PRENATAL FA PO) Take 1 tablet by mouth daily.   Yes [provider]  quinapril (ACCUPRIL) 40 MG tablet Take 40 mg by mouth daily.    Yes [provider]  silver sulfADIAZINE (SILVADENE) 1 % cream Apply 1 application topically daily. Patient taking differently: Apply 1 application topically daily as needed (wounds on toes and legs). 02/26/20  Yes Early, Arvilla Meres, MD  tamsulosin (FLOMAX) 0.4 MG CAPS capsule Take 0.4 mg by mouth daily. 04/02/21   Yes [provider]  warfarin (COUMADIN) 5 MG tablet Take 5-7.5 mg by mouth See admin instructions. Take 5 mg in the evening on Sun, Mon, Wed, and Fri. Take 7.5 mg in the evening on Tue, Thur, and Sat   Yes [provider]  Wheat Dextrin (BENEFIBER) POWD Take 15 mLs by mouth daily as needed (constipation). Mix in coffee and drink   Yes [provider]  Continuous Blood Gluc Sensor (FREESTYLE LIBRE 2 SENSOR) MISC APPLY EVERY 14 DAYS AS DIRECTED 03/28/20   [provider]  FREESTYLE LITE test strip TEST BLOOD SUGAR FOUR TIMES DAILY 02/11/20   [provider]   No results found. - pertinent xrays, CT, MRI studies were reviewed and independently interpreted  Positive ROS: All other systems have  been reviewed and were otherwise negative with the exception of those mentioned in the HPI and as above.  Physical Exam: General: Alert, no acute distress Psychiatric: Patient is competent for consent with normal mood and affect Lymphatic: No axillary or cervical lymphadenopathy Cardiovascular: No pedal edema Respiratory: No cyanosis, no use of accessory musculature GI: No organomegaly, abdomen is soft and non-tender    Images:  @ENCIMAGES @  Labs:  Lab Results  Component Value Date   HGBA1C 6.1 (H) 06/18/2021   HGBA1C 7.5 (H) 04/03/2020   HGBA1C 7.4 (H) 12/31/2014   REPTSTATUS 07/06/2021 FINAL 07/01/2021   GRAMSTAIN  07/01/2021    NO ORGANISMS SEEN SQUAMOUS EPITHELIAL CELLS PRESENT MODERATE WBC PRESENT,BOTH PMN AND MONONUCLEAR MODERATE GRAM NEGATIVE RODS FEW GRAM POSITIVE COCCI    CULT (A) 07/01/2021    MULTIPLE ORGANISMS PRESENT, NONE PREDOMINANT NO STAPHYLOCOCCUS AUREUS ISOLATED NO GROUP A STREP (S.PYOGENES) ISOLATED NO ANAEROBES ISOLATED Performed at [facility], [place] [address], [place], [place] [address]    LABORGA PROTEUS MIRABILIS 03/24/2020   LABORGA PSEUDOMONAS AERUGINOSA 03/24/2020    Lab Results  Component Value Date    ALBUMIN 2.4 (L) 06/24/2021   ALBUMIN 2.8 (L) 06/11/2021   ALBUMIN 3.1 (L) 04/05/2020   PREALBUMIN 16.9 (L) 04/03/2020     CBC EXTENDED Latest Ref Rng & Units 07/07/2021 07/06/2021 07/05/2021  WBC 4.0 - 10.5 K/uL 5.3 5.6 6.4  RBC 4.22 - 5.81 MIL/uL 3.03(L) 2.84(L) 2.96(L)  HGB 13.0 - 17.0 g/dL 8.6(L) 8.2(L) 8.5(L)  HCT 39.0 - 52.0 % 28.9(L) 26.6(L) 27.5(L)  PLT 150 - 400 K/uL 263 260 269  NEUTROABS 1.7 - 7.7 K/uL - - -  LYMPHSABS 0.7 - 4.0 K/uL - - -    Neurologic: Patient does not have protective sensation bilateral lower extremities.   MUSCULOSKELETAL:   Skin: Patient has necrotic blistering over the medial left knee and venous ulceration to the left leg.  Patient's white blood cell count is 5.3 hemoglobin 8.6.  Albumin 2.4 with a prealbumin of 16.9.  Hemoglobin A1c 6.1.  Patient has palpable pulses bilaterally.  Patient is status post a right great toe amputation at [facility].  Patient was previously treated for wound care for the right great toe at the wound center.  Patient has venous stasis swelling of the right lower extremity with ulceration.  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  Plan: Plan: We will plan for serial debridement for the ulcerative skin on the left knee.  Patient will need compression stockings for the right lower extremity and I will place orders for this.  Anticipate return to the operating room on Friday.    Thank you for the consult and the opportunity to see Mr. Idelia Salm, MD [facility] [phone] 9:40 AM

## 2021-07-07 NOTE — Progress Notes (Signed)
RT note. Patient has home unit in place for CPAP. Patient can place self on CPAP when ready for bed, RT will continue to monitor.

## 2021-07-07 NOTE — Progress Notes (Signed)
PROGRESS NOTE  Joseph [name]. TGG:[id number] DOB: 1955-10-29 DOA: 06/11/2021 PCP: Donald Prose, MD  HPI/Recap of past 24 hours: Joseph [name]. is a 65 y.o. male with PMH significant for severe morbid obesity, OSA on CPAP, DM2, HTN, HLD, PAD, DVT on Coumadin, GERD, anxiety/depression, polyneuropathy, multiple falls who presented to the ED on 06/11/21 after fall at home resulting in left knee pain.  He was found to have left knee dislocation and fracture fragments with associated left knee effusion.  There were concern for possible left knee hematoma in setting of supratherapeutic INR.  Seen by orthopedic surgery, patient underwent closed reduction of left knee dislocation but no surgical management was planned because of patient's body habitus.  Patient is scheduled for a I&D on 07/08/2021 by Dr. Sharol Given.  07/07/2021: Patient was seen at his bedside.  Complains of the bed and being very uncomfortable.  Seen by orthopedic surgery, plan for I&D on Wednesday and Friday  Assessment/Plan: Principal Problem:   Left knee dislocation, initial encounter Active Problems:   Pure hypercholesterolemia   Essential hypertension, benign   OSA (obstructive sleep apnea)   Hx pulmonary embolism   Esophageal reflux   Diabetes mellitus type 2, uncontrolled   Diabetic neuropathy (HCC)   Acute renal failure superimposed on stage 3b chronic kidney disease (HCC)   Normocytic anemia   Class 3 obesity (HCC)   Aortic atherosclerosis (HCC)   Coronary atherosclerosis   Effusion of left knee   Left knee dislocation   Pressure injury of skin   Unstageable pressure ulcer of sacral region ([place])   At risk for hemorrhage associated with anticoagulation therapy   Ulcer of left knee, with necrosis of bone ([place])  Left knee dislocation status post left knee reduction external fixation with evacuation hematoma  Left knee hematoma -Presented with fall.  X-ray showed left knee dislocation with several fracture fragments and  hematoma. -Seen by orthopedic surgery, patient's knee reduced with recommendations for nonweightbearing status of left lower extremity, strict use of an immobilizer and no range of motion..  -Because of patient's heavy body habitus, surgical fixation was not recommended at the time. -However, MRI obtained on 9/30 showed evidence of significant ligamentous injury in addition to lateral patellar dislocation.  Orthopedics team revisited the decision and made a plan of surgical fixation.  -On 10/5, patient underwent left knee extensive debridement, reduction of knee dislocation, external fixation spanning of knee dislocation and a wound VAC application. -Orthopedic surgery Dr. Marlou Sa recommended transfer to [facility] to be seen by Dr. Sharol Given for further evaluation and management.   Plan to return to the OR for I&D on Wednesday, 07/08/2021 and Friday, 07/10/21 with Dr. Sharol Given.  History of pulmonary embolism, home Coumadin on hold On heparin drip, renally dosed by pharmacy. Coumadin is on hold due to planned procedure on 07/08/2021 and 07/10/2021 by orthopedic surgery. Restart Coumadin when no further procedures are planned.   Resolving AKI Baseline creatinine appears to be 1.2 with GFR greater than 60. Creatinine downtrending 1.5 with GFR 59 from 2.37 with GFR of 30 Continue to avoid nephrotoxic agents, dehydration and hypotension Continue to monitor urine output Repeat BMP in the morning  Resolving posttreatment: Hyperkalemia suspect contributed by Ensure, oral supplement Serum potassium 5.2>> 5.0>> 4.9. Treated with Lokelma 10 g x 1 dose. May substitute Ensure for Nepro Repeat BMP in the morning  Type 2 diabetes mellitus Diabetic neuropathy -A1c 6.1 on 9/22 -Currently on Actos, Semglee 50 units nightly with NovoLog 3  units 3 times daily and sliding cell insulin.   Hold off home oral hypoglycemics.   Essential hypertension BP is currently at goal Continue on amlodipine and lisinopril.    Hyperlipidemia -Pravastatin on hold.   Chronic normocytic anemia -Baseline hemoglobin over 10.  In this admission, patient received a total of 2 units of PRBC and 1 unit of FFP.   Hemoglobin 8.6 from 8.2.   Continue to monitor H&H, transfuse when indicated. Continue to monitor H&H   Transient bradycardia  1st degree heart block Asymptomatic. In setting of known sleep apnea but occurs while awake as well. EKG significant for 1st degree AV block which does not appear to be new. Telemetry with heart rate down to high 30s while awake -Per cardiology, patient will get Zio patch at discharge. Continue close monitoring.   Severe morbid obesity  -Body mass index is 63.41 kg/m. Patient has been advised to make an attempt to improve diet and exercise patterns to aid in weight loss. PT recommended Kreg bed.   GERD -Continue Protonix   OSA, compliant with CPAP Has been compliant with CPAP, encouraged to continue. -Continue CPAP qHS   Constipation, opiate induced. -in setting of opiate use. -Continue Colace, Senokot, Miralax -Dulcolax suppository prn   Pressure injury -Mid-nose, not present on admission. Secondary to CPAP mask  Generalized weakness PT OT assessments with orthopedic surgery's guidance. Appreciate PT OT's assistance Continue fall precautions   Mobility: PT eval postprocedure with orthopedic surgery's guidance. Code Status:   Code Status: Full Code  Nutritional status: Body mass index is 63.41 kg/m. Nutrition Problem: Inadequate oral intake Etiology: decreased appetite Signs/Symptoms: per patient/family report Diet:  Diet Order                  Diet Carb Modified Fluid consistency: Thin; Room service appropriate? Yes  Diet effective now                       DVT prophylaxis:  Heparin drip.   Antimicrobials: Not on scheduled antibiotics Fluid: None Consultants: Orthopedics Family Communication: Updated his wife at bedside on 07/05/2021.   Status  is: Inpatient   Remains inpatient appropriate because: Plan for revision of surgery next week   Dispo: The patient is from: Home              Anticipated d/c is to: SNF versus CIR.              Patient currently is not medically stable to d/c.              Difficult to place patient No     Objective: Vitals:   07/06/21 0812 07/06/21 1135 07/06/21 2120 07/07/21 0809  BP: (!) 146/42 (!) 129/44 (!) 163/41 (!) 141/42  Pulse: 80 73 78 73  Resp: 17 15 17 17   Temp: 98.7 F (37.1 C) 98.9 F (37.2 C) 97.9 F (36.6 C) 98.3 F (36.8 C)  TempSrc: Oral Oral Oral Oral  SpO2: 96% 95% 97% 97%  Weight:      Height:        Intake/Output Summary (Last 24 hours) at 07/07/2021 1509 Last data filed at 07/07/2021 0400 Gross per 24 hour  Intake 2228 ml  Output 1100 ml  Net 1128 ml   Filed Weights   06/11/21 1326 06/25/21 0900 07/01/21 1518  Weight: (!) 208.7 kg (!) 218 kg (!) 218 kg    Exam:  General: 65 y.o. year-old male severely morbidly obese  in no acute distress, appears uncomfortable laying in bed.  He is alert and oriented x3. Cardiovascular: Regular rate and rhythm no rubs or gallops.  Respiratory: Clear to auscultation with no wheezes or rales.  Abdomen: Severely morbidly obese nontender with bowel sounds present.   Musculoskeletal: Trace lower extremity edema bilaterally.   Skin: Right lower extremity hyperpigmented.  Left lower extremity in surgical dressing with external fixation. Psychiatry: Mood is appropriate for condition and setting.   Data Reviewed: CBC: Recent Labs  Lab 07/03/21 0007 07/04/21 0118 07/05/21 0259 07/06/21 0157 07/07/21 0744  WBC 7.0 6.8 6.4 5.6 5.3  HGB 9.0* 8.1* 8.5* 8.2* 8.6*  HCT 30.3* 27.1* 27.5* 26.6* 28.9*  MCV 95.6 94.1 92.9 93.7 95.4  PLT 305 294 269 260 253   Basic Metabolic Panel: Recent Labs  Lab 07/03/21 1827 07/04/21 0118 07/05/21 0259 07/06/21 0157 07/07/21 0744  NA 135 131* 132* 133* 137  K 5.6* 5.1 5.2* 5.0 4.9  CL  104 102 101 104 106  CO2 23 24 23 22 24   GLUCOSE 205* 224* 181* 132* 107*  BUN 63* 63* 68* 67* 61*  CREATININE 2.23* 2.37* 2.09* 1.73* 1.35*  CALCIUM 8.5* 8.0* 8.2* 8.3* 8.8*  MG  --   --   --   --  2.4  PHOS  --   --   --   --  3.2   GFR: Estimated Creatinine Clearance: 105.6 mL/min (A) (by C-G formula based on SCr of 1.35 mg/dL (H)). Liver Function Tests: No results for input(s): AST, ALT, ALKPHOS, BILITOT, PROT, ALBUMIN in the last 168 hours. No results for input(s): LIPASE, AMYLASE in the last 168 hours. No results for input(s): AMMONIA in the last 168 hours. Coagulation Profile: Recent Labs  Lab 07/03/21 0007 07/04/21 0118 07/05/21 0259 07/06/21 0157 07/07/21 0237  INR 2.3* 1.8* 1.6* 1.4* 1.2   Cardiac Enzymes: No results for input(s): CKTOTAL, CKMB, CKMBINDEX, TROPONINI in the last 168 hours. BNP (last 3 results) No results for input(s): PROBNP in the last 8760 hours. HbA1C: No results for input(s): HGBA1C in the last 72 hours. CBG: Recent Labs  Lab 07/06/21 1125 07/06/21 1610 07/06/21 2058 07/07/21 0645 07/07/21 1109  GLUCAP 145* 122* 127* 105* 125*   Lipid Profile: No results for input(s): CHOL, HDL, LDLCALC, TRIG, CHOLHDL, LDLDIRECT in the last 72 hours. Thyroid Function Tests: No results for input(s): TSH, T4TOTAL, FREET4, T3FREE, THYROIDAB in the last 72 hours. Anemia Panel: No results for input(s): VITAMINB12, FOLATE, FERRITIN, TIBC, IRON, RETICCTPCT in the last 72 hours. Urine analysis:    Component Value Date/Time   COLORURINE YELLOW 04/03/2020 0228   APPEARANCEUR CLEAR 04/03/2020 0228   LABSPEC 1.018 04/03/2020 0228   PHURINE 5.0 04/03/2020 0228   GLUCOSEU NEGATIVE 04/03/2020 0228   HGBUR NEGATIVE 04/03/2020 0228   BILIRUBINUR NEGATIVE 04/03/2020 0228   KETONESUR 5 (A) 04/03/2020 0228   PROTEINUR NEGATIVE 04/03/2020 0228   UROBILINOGEN 0.2 12/30/2014 2032   NITRITE NEGATIVE 04/03/2020 0228   LEUKOCYTESUR NEGATIVE 04/03/2020 0228   Sepsis  Labs: @LABRCNTIP (procalcitonin:4,lacticidven:4)  ) Recent Results (from the past 240 hour(s))  Surgical pcr screen     Status: None   Collection Time: 07/01/21  6:22 PM   Specimen: Nasal Mucosa; Nasal Swab  Result Value Ref Range Status   MRSA, PCR NEGATIVE NEGATIVE Final   Staphylococcus aureus NEGATIVE NEGATIVE Final    Comment: (NOTE) The Xpert SA Assay (FDA approved for NASAL specimens in patients 49 years of age and older), is  one component of a comprehensive surveillance program. It is not intended to diagnose infection nor to guide or monitor treatment. Performed at [facility], [place] [address] [place], [place] [address]   Aerobic/Anaerobic Culture w Gram Stain (surgical/deep wound)     Status: Abnormal   Collection Time: 07/01/21  7:39 PM   Specimen: Wound  Result Value Ref Range Status   Specimen Description   Final    WOUND KNEE LEFT Performed at [place] [address] [place], [place] [address]    Special Requests   Final    NONE Performed at [facility], [place] [address], [place], [place] [address]    Gram Stain   Final    NO ORGANISMS SEEN SQUAMOUS EPITHELIAL CELLS PRESENT MODERATE WBC PRESENT,BOTH PMN AND MONONUCLEAR MODERATE GRAM NEGATIVE RODS FEW GRAM POSITIVE COCCI    Culture (A)  Final    MULTIPLE ORGANISMS PRESENT, NONE PREDOMINANT NO STAPHYLOCOCCUS AUREUS ISOLATED NO GROUP A STREP (S.PYOGENES) ISOLATED NO ANAEROBES ISOLATED Performed at [facility], [place] [address] [place], [place] [address]    Report Status 07/06/2021 FINAL  Final      Studies: No results found.  Scheduled Meds:  (feeding supplement) PROSource Plus  30 mL Oral BID BM   amLODipine  10 mg Oral Daily   cephALEXin  250 mg Oral QHS   docusate sodium  100 mg Oral BID   feeding supplement (GLUCERNA SHAKE)  237 mL Oral Q24H   FLUoxetine  40 mg Oral QPM   insulin aspart  0-20 Units Subcutaneous TID WC    insulin aspart  0-5 Units Subcutaneous QHS   insulin aspart  3 Units Subcutaneous TID WC   insulin glargine-yfgn  55 Units Subcutaneous QHS   methocarbamol  1,000 mg Oral BID   multivitamin with minerals  1 tablet Oral Daily   nutrition supplement (JUVEN)  1 packet Oral BID BM   pantoprazole  40 mg Oral Daily   pioglitazone  45 mg Oral Daily   polyethylene glycol  17 g Oral BID   Ensure Max Protein  11 oz Oral Daily   senna  1 tablet Oral Daily   tamsulosin  0.4 mg Oral Daily    Continuous Infusions:  sodium chloride 75 mL/hr at 07/07/21 0318   heparin 2,600 Units/hr (07/07/21 1048)   methocarbamol (ROBAXIN) IV 500 mg (07/01/21 2138)     LOS: 25 days     Kayleen Memos, MD [facility] Pager [phone]  If 7PM-7AM, please contact night-coverage [email or web address] Password [email or web address] 07/07/2021, 3:09 PM

## 2021-07-08 ENCOUNTER — Encounter (HOSPITAL_COMMUNITY)
Admission: EM | Disposition: A | Discharge status: Hospice | Payer: Self-pay | Source: Home / Self Care | Attending: Internal Medicine

## 2021-07-08 ENCOUNTER — Other Ambulatory Visit: Payer: Self-pay | Admitting: Family

## 2021-07-08 ENCOUNTER — Inpatient Hospital Stay (HOSPITAL_COMMUNITY): Payer: HMO | Admitting: Certified Registered"

## 2021-07-08 ENCOUNTER — Encounter (HOSPITAL_COMMUNITY): Payer: Self-pay | Admitting: Internal Medicine

## 2021-07-08 DIAGNOSIS — S81002D Unspecified open wound, left knee, subsequent encounter: Secondary | ICD-10-CM

## 2021-07-08 DIAGNOSIS — S83105A Unspecified dislocation of left knee, initial encounter: Secondary | ICD-10-CM | POA: Diagnosis not present

## 2021-07-08 HISTORY — PX: I & D EXTREMITY: SHX5045

## 2021-07-08 LAB — GLUCOSE, CAPILLARY
Glucose-Capillary: 186 mg/dL — ABNORMAL HIGH (ref 70–99)
Glucose-Capillary: 136 mg/dL — ABNORMAL HIGH (ref 70–99)
Glucose-Capillary: 130 mg/dL — ABNORMAL HIGH (ref 70–99)
Glucose-Capillary: 134 mg/dL — ABNORMAL HIGH (ref 70–99)
Glucose-Capillary: 151 mg/dL — ABNORMAL HIGH (ref 70–99)

## 2021-07-08 LAB — PHOSPHORUS: Phosphorus: 3.1 mg/dL (ref 2.5–4.6)

## 2021-07-08 LAB — CBC
HCT: 27 % — ABNORMAL LOW (ref 39.0–52.0)
Hemoglobin: 8.2 g/dL — ABNORMAL LOW (ref 13.0–17.0)
MCH: 28.8 pg (ref 26.0–34.0)
MCHC: 30.4 g/dL (ref 30.0–36.0)
MCV: 94.7 fL (ref 80.0–100.0)
Platelets: 250 10*3/uL (ref 150–400)
RBC: 2.85 MIL/uL — ABNORMAL LOW (ref 4.22–5.81)
RDW: 16.2 % — ABNORMAL HIGH (ref 11.5–15.5)
WBC: 4.8 10*3/uL (ref 4.0–10.5)
nRBC: 0 % (ref 0.0–0.2)

## 2021-07-08 LAB — TYPE AND SCREEN
ABO/RH(D): A POS
Antibody Screen: NEGATIVE

## 2021-07-08 LAB — PROTIME-INR
INR: 1.3 — ABNORMAL HIGH (ref 0.8–1.2)
Prothrombin Time: 16.1 seconds — ABNORMAL HIGH (ref 11.4–15.2)

## 2021-07-08 LAB — BASIC METABOLIC PANEL
Anion gap: 4 — ABNORMAL LOW (ref 5–15)
BUN: 57 mg/dL — ABNORMAL HIGH (ref 8–23)
CO2: 23 mmol/L (ref 22–32)
Calcium: 8.5 mg/dL — ABNORMAL LOW (ref 8.9–10.3)
Chloride: 107 mmol/L (ref 98–111)
Creatinine, Ser: 1.21 mg/dL (ref 0.61–1.24)
GFR, Estimated: >60 mL/min (ref >60)
Glucose, Bld: 245 mg/dL — ABNORMAL HIGH (ref 70–99)
Potassium: 5.2 mmol/L — ABNORMAL HIGH (ref 3.5–5.1)
Sodium: 134 mmol/L — ABNORMAL LOW (ref 135–145)

## 2021-07-08 LAB — MAGNESIUM: Magnesium: 2.3 mg/dL (ref 1.7–2.4)

## 2021-07-08 LAB — HEPARIN LEVEL (UNFRACTIONATED): Heparin Unfractionated: 0.35 IU/mL (ref 0.30–0.70)

## 2021-07-08 SURGERY — IRRIGATION AND DEBRIDEMENT EXTREMITY
Anesthesia: General | Laterality: Left

## 2021-07-08 MED ORDER — TRANEXAMIC ACID 1000 MG/10ML IV SOLN: 2000.0000 mg | INTRAVENOUS | Status: DC

## 2021-07-08 MED ORDER — LACTATED RINGERS IV SOLN: INTRAVENOUS | Status: DC | PRN

## 2021-07-08 MED ORDER — POVIDONE-IODINE 7.5 % EX SOLN: Freq: Once | CUTANEOUS | Status: DC

## 2021-07-08 MED ORDER — CEFAZOLIN SODIUM-DEXTROSE 2-4 GM/100ML-% IV SOLN
2.0000 g | Freq: Four times a day (QID) | INTRAVENOUS | Status: AC
  Administered 2021-07-08 (×2): 2 g via INTRAVENOUS
  Filled 2021-07-08 (×3): qty 100

## 2021-07-08 MED ORDER — CEFAZOLIN SODIUM-DEXTROSE 2-4 GM/100ML-% IV SOLN
2.0000 g | INTRAVENOUS | Status: AC
  Administered 2021-07-08: 3 g via INTRAVENOUS
  Filled 2021-07-08: qty 100

## 2021-07-08 MED ORDER — FENTANYL CITRATE (PF) 250 MCG/5ML IJ SOLN
INTRAMUSCULAR | Status: DC | PRN
  Administered 2021-07-08: 50 ug via INTRAVENOUS

## 2021-07-08 MED ORDER — METOCLOPRAMIDE HCL 10 MG PO TABS: 5.0000 mg | ORAL_TABLET | Freq: Three times a day (TID) | ORAL | Status: DC | PRN

## 2021-07-08 MED ORDER — METHOCARBAMOL 1000 MG/10ML IJ SOLN
500.0000 mg | Freq: Four times a day (QID) | INTRAVENOUS | Status: DC | PRN
  Filled 2021-07-08: qty 5

## 2021-07-08 MED ORDER — ONDANSETRON HCL 4 MG/2ML IJ SOLN
INTRAMUSCULAR | Status: DC | PRN
  Administered 2021-07-08: 4 mg via INTRAVENOUS

## 2021-07-08 MED ORDER — MEPERIDINE HCL 25 MG/ML IJ SOLN: 6.2500 mg | INTRAMUSCULAR | Status: DC | PRN

## 2021-07-08 MED ORDER — POLYETHYLENE GLYCOL 3350 17 G PO PACK
17.0000 g | PACK | Freq: Every day | ORAL | Status: DC | PRN
Start: 2021-07-08 — End: 2021-07-10

## 2021-07-08 MED ORDER — METHOCARBAMOL 500 MG PO TABS
500.0000 mg | ORAL_TABLET | Freq: Four times a day (QID) | ORAL | Status: DC | PRN
  Administered 2021-07-08 – 2021-07-26 (×10): 500 mg via ORAL
  Filled 2021-07-08 (×12): qty 1

## 2021-07-08 MED ORDER — ONDANSETRON HCL 4 MG PO TABS: 4.0000 mg | ORAL_TABLET | Freq: Four times a day (QID) | ORAL | Status: DC | PRN

## 2021-07-08 MED ORDER — MIDAZOLAM HCL 2 MG/2ML IJ SOLN: 0.5000 mg | Freq: Once | INTRAMUSCULAR | Status: DC | PRN

## 2021-07-08 MED ORDER — PROMETHAZINE HCL 25 MG/ML IJ SOLN: 6.2500 mg | INTRAMUSCULAR | Status: DC | PRN

## 2021-07-08 MED ORDER — METOCLOPRAMIDE HCL 5 MG/ML IJ SOLN
5.0000 mg | Freq: Three times a day (TID) | INTRAMUSCULAR | Status: DC | PRN
  Administered 2021-07-11: 10 mg via INTRAVENOUS
  Filled 2021-07-08: qty 2

## 2021-07-08 MED ORDER — BISACODYL 10 MG RE SUPP: 10.0000 mg | Freq: Every day | RECTAL | Status: DC | PRN

## 2021-07-08 MED ORDER — LIDOCAINE 2% (20 MG/ML) 5 ML SYRINGE
INTRAMUSCULAR | Status: DC | PRN
  Administered 2021-07-08: 20 mg via INTRAVENOUS

## 2021-07-08 MED ORDER — ONDANSETRON HCL 4 MG/2ML IJ SOLN
4.0000 mg | Freq: Four times a day (QID) | INTRAMUSCULAR | Status: DC | PRN
Start: 2021-07-08 — End: 2021-07-10

## 2021-07-08 MED ORDER — LACTATED RINGERS IV SOLN: INTRAVENOUS | Status: DC

## 2021-07-08 MED ORDER — PROPOFOL 10 MG/ML IV BOLUS
INTRAVENOUS | Status: DC | PRN
  Administered 2021-07-08: 200 mg via INTRAVENOUS

## 2021-07-08 MED ORDER — SUGAMMADEX SODIUM 200 MG/2ML IV SOLN
INTRAVENOUS | Status: DC | PRN
  Administered 2021-07-08: 400 mg via INTRAVENOUS

## 2021-07-08 MED ORDER — DOCUSATE SODIUM 100 MG PO CAPS
100.0000 mg | ORAL_CAPSULE | Freq: Two times a day (BID) | ORAL | Status: DC
  Administered 2021-07-08 – 2021-07-10 (×4): 100 mg via ORAL
  Filled 2021-07-08 (×4): qty 1

## 2021-07-08 MED ORDER — OXYCODONE HCL 5 MG PO TABS: 5.0000 mg | ORAL_TABLET | Freq: Once | ORAL | Status: DC | PRN

## 2021-07-08 MED ORDER — SODIUM CHLORIDE 0.9 % IV SOLN
INTRAVENOUS | Status: DC
  Administered 2021-07-12: 100 mL via INTRAVENOUS

## 2021-07-08 MED ORDER — FENTANYL CITRATE (PF) 250 MCG/5ML IJ SOLN
INTRAMUSCULAR | Status: AC
  Filled 2021-07-08: qty 5

## 2021-07-08 MED ORDER — TRANEXAMIC ACID-NACL 1000-0.7 MG/100ML-% IV SOLN
1000.0000 mg | INTRAVENOUS | Status: AC
  Administered 2021-07-08: 1000 mg via INTRAVENOUS
  Filled 2021-07-08: qty 100

## 2021-07-08 MED ORDER — OXYCODONE HCL 5 MG/5ML PO SOLN: 5.0000 mg | Freq: Once | ORAL | Status: DC | PRN

## 2021-07-08 MED ORDER — POVIDONE-IODINE 10 % EX SWAB
2.0000 "application " | Freq: Once | CUTANEOUS | Status: AC
  Administered 2021-07-08: 2 via TOPICAL

## 2021-07-08 MED ORDER — 0.9 % SODIUM CHLORIDE (POUR BTL) OPTIME
TOPICAL | Status: DC | PRN
  Administered 2021-07-08: 1000 mL

## 2021-07-08 MED ORDER — PROPOFOL 10 MG/ML IV BOLUS
INTRAVENOUS | Status: AC
  Filled 2021-07-08: qty 20

## 2021-07-08 MED ORDER — SUGAMMADEX SODIUM 500 MG/5ML IV SOLN
INTRAVENOUS | Status: AC
  Filled 2021-07-08: qty 5

## 2021-07-08 MED ORDER — SODIUM CHLORIDE 0.9 % IV SOLN
INTRAVENOUS | Status: DC | PRN
  Administered 2021-07-08: 30 ug/min via INTRAVENOUS

## 2021-07-08 MED ORDER — HYDROMORPHONE HCL 1 MG/ML IJ SOLN
0.2500 mg | INTRAMUSCULAR | Status: DC | PRN
  Administered 2021-07-08: 0.25 mg via INTRAVENOUS

## 2021-07-08 MED ORDER — CHLORHEXIDINE GLUCONATE 0.12 % MT SOLN
OROMUCOSAL | Status: AC
  Filled 2021-07-08: qty 15

## 2021-07-08 MED ORDER — SUCCINYLCHOLINE CHLORIDE 200 MG/10ML IV SOSY
PREFILLED_SYRINGE | INTRAVENOUS | Status: DC | PRN
  Administered 2021-07-08: 160 mg via INTRAVENOUS

## 2021-07-08 MED ORDER — ROCURONIUM BROMIDE 10 MG/ML (PF) SYRINGE
PREFILLED_SYRINGE | INTRAVENOUS | Status: DC | PRN
  Administered 2021-07-08: 50 mg via INTRAVENOUS

## 2021-07-08 MED ORDER — HYDROMORPHONE HCL 1 MG/ML IJ SOLN
INTRAMUSCULAR | Status: AC
  Filled 2021-07-08: qty 1

## 2021-07-08 SURGICAL SUPPLY — 33 items
BAG COUNTER SPONGE SURGICOUNT (BAG) IMPLANT
BLADE SURG 21 STRL SS (BLADE) ×2 IMPLANT
BNDG COHESIVE 6X5 TAN STRL LF (GAUZE/BANDAGES/DRESSINGS) IMPLANT
BNDG GAUZE ELAST 4 BULKY (GAUZE/BANDAGES/DRESSINGS) ×4 IMPLANT
COVER SURGICAL LIGHT HANDLE (MISCELLANEOUS) ×4 IMPLANT
DRAPE ORTHO SPLIT 77X108 STRL (DRAPES) ×2
DRAPE SURG ORHT 6 SPLT 77X108 (DRAPES) IMPLANT
DRAPE U-SHAPE 47X51 STRL (DRAPES) ×2 IMPLANT
DRSG ADAPTIC 3X8 NADH LF (GAUZE/BANDAGES/DRESSINGS) ×2 IMPLANT
DURAPREP 26ML APPLICATOR (WOUND CARE) ×2 IMPLANT
ELECT REM PT RETURN 9FT ADLT (ELECTROSURGICAL)
ELECTRODE REM PT RTRN 9FT ADLT (ELECTROSURGICAL) IMPLANT
GAUZE SPONGE 4X4 12PLY STRL (GAUZE/BANDAGES/DRESSINGS) ×2 IMPLANT
GLOVE SURG ORTHO LTX SZ9 (GLOVE) ×2 IMPLANT
GLOVE SURG UNDER POLY LF SZ9 (GLOVE) ×2 IMPLANT
GOWN STRL REUS W/ TWL XL LVL3 (GOWN DISPOSABLE) ×2 IMPLANT
GOWN STRL REUS W/TWL XL LVL3 (GOWN DISPOSABLE) ×4
HANDPIECE INTERPULSE COAX TIP (DISPOSABLE)
KIT BASIN OR (CUSTOM PROCEDURE TRAY) ×2 IMPLANT
KIT TURNOVER KIT B (KITS) ×2 IMPLANT
MANIFOLD NEPTUNE II (INSTRUMENTS) ×2 IMPLANT
NS IRRIG 1000ML POUR BTL (IV SOLUTION) ×2 IMPLANT
PACK ORTHO EXTREMITY (CUSTOM PROCEDURE TRAY) ×2 IMPLANT
PAD ARMBOARD 7.5X6 YLW CONV (MISCELLANEOUS) ×4 IMPLANT
SET HNDPC FAN SPRY TIP SCT (DISPOSABLE) IMPLANT
SPONGE T-LAP 18X18 ~~LOC~~+RFID (SPONGE) ×1 IMPLANT
STOCKINETTE IMPERVIOUS 9X36 MD (GAUZE/BANDAGES/DRESSINGS) IMPLANT
SUT ETHILON 2 0 PSLX (SUTURE) ×2 IMPLANT
SWAB COLLECTION DEVICE MRSA (MISCELLANEOUS) ×2 IMPLANT
SWAB CULTURE ESWAB REG 1ML (MISCELLANEOUS) IMPLANT
TOWEL GREEN STERILE (TOWEL DISPOSABLE) ×2 IMPLANT
TUBE CONNECTING 12X1/4 (SUCTIONS) ×2 IMPLANT
YANKAUER SUCT BULB TIP NO VENT (SUCTIONS) ×2 IMPLANT

## 2021-07-08 NOTE — Progress Notes (Signed)
RT note. Patient called to bedside per RN patient keeps desating. Patient currently on home cpap with 6L bled in line, patient currently sating 95% with  no labored breathing noted. RT will continue to monitor.

## 2021-07-08 NOTE — Anesthesia Postprocedure Evaluation (Signed)
Anesthesia Post Note  Patient: Joseph [name].  Procedure(s) Performed: LEFT THIGH DEBRIDEMENT (Left)     Patient location during evaluation: PACU Anesthesia Type: General Level of consciousness: awake and alert, patient cooperative and oriented Pain management: pain level controlled (pain improving) Vital Signs Assessment: post-procedure vital signs reviewed and stable Respiratory status: spontaneous breathing, nonlabored ventilation, respiratory function stable and patient connected to nasal cannula oxygen Cardiovascular status: blood pressure returned to baseline and stable Postop Assessment: no apparent nausea or vomiting Anesthetic complications: no   No notable events documented.  Last Vitals:  Vitals:   07/08/21 1245 07/08/21 1300  BP: (!) 174/46 (!) 178/46  Pulse: 74 75  Resp: (!) 21 (!) 22  Temp:  36.5 C  SpO2: 96% 96%    Last Pain:  Vitals:   07/08/21 1300  TempSrc:   PainSc: 8                  Alford Gamero,E. Jamariya Davidoff

## 2021-07-08 NOTE — Op Note (Addendum)
07/08/2021  12:23 PM  PATIENT:  Joseph [name].    PRE-OPERATIVE DIAGNOSIS:  Left Thigh Ulcer  POST-OPERATIVE DIAGNOSIS:  Same  PROCEDURE:  LEFT THIGH DEBRIDEMENT Application of cleanse choice wound VAC sponge. Adjustment external fixator.  SURGEON:  Newt Minion, MD  PHYSICIAN ASSISTANT:None ANESTHESIA:   General  PREOPERATIVE INDICATIONS:  Joseph [name]. is a  65 y.o. male with a diagnosis of Left Thigh Ulcer who failed conservative measures and elected for surgical management.    The risks benefits and alternatives were discussed with the patient preoperatively including but not limited to the risks of infection, bleeding, nerve injury, cardiopulmonary complications, the need for revision surgery, among others, and the patient was willing to proceed.  OPERATIVE IMPLANTS: Praveena cleanse choice blue sponge x1  @ENCIMAGES @  OPERATIVE FINDINGS: Necrotic skin and soft tissue and fascia was excised sharply.  Margins were healthy viable  OPERATIVE PROCEDURE: Patient was brought the operating room and underwent a general anesthetic.  After adequate levels anesthesia were obtained patient's left lower extremity was prepped using Betadine paint and draped into a sterile field a timeout was called.  A 21 blade knife was used to surgically excise around the margins approximately a centimeter back to get to healthy viable margins.  There was some fibrinous exudative tissue that was removed with a rondure remaining nonviable tissue was excised with a 21 blade knife there was no purulence.  The wound was irrigated with saline.  Electrocardio was used for hemostasis.  The wound was then packed with the cleanse choice sponge this was sealed with derma tack Ioban and Dermabond.  This had a good suction fit patient was extubated taken the PACU in stable condition  Patient's abdomen was sustaining pressure from the external fixator.  This required adjustment of the external fixator.  The external  fixator was loosened 1 bar at a time adjusted to relieve pressure from the abdomen.  This was tightened there is no change in the alignment.  Debridement type: Excisional Debridement  Side: left  Body Location: knee   Tools used for debridement: scalpel and rongeur  Pre-debridement Wound size (cm):   Length: 10        Width: 10     Depth: 2   Post-debridement Wound size (cm):   Length: 15        Width: 15     Depth: 3   Debridement depth beyond dead/damaged tissue down to healthy viable tissue: yes  Tissue layer involved: skin, subcutaneous tissue, muscle / fascia  Nature of tissue removed: Non-viable tissue  Irrigation volume: 1 liter     Irrigation fluid type: Normal Saline    DISCHARGE PLANNING:  Antibiotic duration: Continue IV antibiotics  Weightbearing: Nonweightbearing on the left  Pain medication: Opioid pathway  Dressing care/ Wound VAC: Continue wound VAC  Ambulatory devices: Transfers per therapy with a tilt table if available  Discharge to: Anticipate return to the operating room on Friday  Follow-up: In the office 1 week post operative.

## 2021-07-08 NOTE — Transfer of Care (Signed)
Immediate Anesthesia Transfer of Care Note  Patient: Joseph [name].  Procedure(s) Performed: LEFT THIGH DEBRIDEMENT (Left)  Patient Location: PACU  Anesthesia Type:General  Level of Consciousness: awake and alert   Airway & Oxygen Therapy: Patient Spontanous Breathing and Patient connected to nasal cannula oxygen  Post-op Assessment: Report given to RN, Post -op Vital signs reviewed and stable and Patient moving all extremities X 4  Post vital signs: Reviewed and stable  Last Vitals:  Vitals Value Taken Time  BP 171/45 07/08/21 1231  Temp 36.6 C 07/08/21 1230  Pulse 73 07/08/21 1234  Resp 26 07/08/21 1234  SpO2 95 % 07/08/21 1234  Vitals shown include unvalidated device data.  Last Pain:  Vitals:   07/08/21 1008  TempSrc:   PainSc: 0-No pain      Patients Stated Pain Goal: 2 (63/33/54 5625)  Complications: No notable events documented.

## 2021-07-08 NOTE — Progress Notes (Addendum)
  ANTICOAGULATION CONSULT NOTE - Follow Up Consult  Pharmacy Consult for Heparin Indication: h/o pulmonary embolus  Allergies  Allergen Reactions   Tape     Peels skin on legs   Chlorhexidine Rash    Wipes    Patient Measurements: Height: 6\' 1"  (185.4 cm) Weight: (!) 218 kg (480 lb 9.6 oz) IBW/kg (Calculated) : 79.9 Heparin Dosing Weight: 132.5 kg  Vital Signs: Temp: 98.4 F (36.9 C) (10/12 0713) Temp Source: Oral (10/12 0713) BP: 142/50 (10/12 0713) Pulse Rate: 98 (10/12 0713)  Labs: Recent Labs    07/06/21 0157 07/07/21 0237 07/07/21 0744 07/08/21 0137 07/08/21 0146  HGB 8.2*  --  8.6* 8.2*  --   HCT 26.6*  --  28.9* 27.0*  --   PLT 260  --  263 250  --   LABPROT 16.9* 15.6*  --  16.1*  --   INR 1.4* 1.2  --  1.3*  --   HEPARINUNFRC 0.49 0.49  --   --  0.35  CREATININE 1.73*  --  1.35* 1.21  --      Estimated Creatinine Clearance: 117.9 mL/min (by C-G formula based on SCr of 1.21 mg/dL).   Medications:  PTA Warfarin:  5 mg on Sun/Mon/Wed/Fri evening, and 7.5 mg on Tue/Thur/Sat evening.   INR was supratherapeutic (6.2) at admission  Assessment: 69 YOM on lifelong warfarin therapy for hx of PE, body habitus, and sedentary lifestyle admitted on 9/15 with left knee dislocation and effusion concerning for hematoma. At admission, warfarin was held, vitamin K was given for supratherapeutic INR, and pt was transfused.  Most recent pharmacy consult to begin heparin when INR decreased to < 2 while held for surgery. Heparin IV began on 10/5 with an INR of 1.7.   Significant Events: Warfarin held 9/15-9/22 Vitamin K given 9/15-9/17 Warfarin resumed 9/23 with heparin bridge (completed 9/30) Warfarin held 10/3 Heparin started 10/5 for INR < 2, held 10/5 PM for surgery, restarted 10/6 AM  HL 0.35 is therapeutic but down trended on same rate 2600 units/hr. Per RN, heparin bag was empty when arrived for shift, unknown for how long. HL was drawn at 1:30 am so cause for  drop is unclear. Of note, patient had labile HLs on stable rates all week.  INR 1.3. H/H, plt stable. Going for surgery today 10/12, hold heparin per surgeon so new heparin bag was not hung.   PTA warfarin dose: 5mg  MWFSu, 7.5mg  TuThSa   Goal of Therapy:  Heparin level 0.3-0.7 units/ml Monitor platelets by anticoagulation protocol: Yes   Plan:  F/u plan to restart heparin infusion after surgery   Daily heparin level and CBC Monitor for s/sx bleeding Follow up restart warfarin     Benetta Spar, PharmD, BCPS, [place] Pharmacist  Please check AMION for all [place] phone numbers After 10:00 PM, call [place]

## 2021-07-08 NOTE — Progress Notes (Signed)
PROGRESS NOTE   Joseph [name].  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 Bedelia Person based course  Left knee dislocation + left knee reduction external fixation with hematoma evacuation 10/5 s/p knee debridement reduction of location external fixation with VAC 10/12 revision of I&D of thigh ulcer Defer planning to orthopedics including wound VAC pain control etc. Current pain meds include Oxy IR 5 every 4 as needed pain 1 to 410 mg every 4 as needed severe pain as well as Dilaudid every 3 as needed 7-10 pain May also use Robaxin as needed for pain Pulmonary embolism on Coumadin at home Resume heparin drip and Coumadin as per orthopedics AKI on admit Peak creatinine 2.37, currently trending downwards Continue saline 50 cc/h, periodic labs Hyperkalemia Possibly secondary to AKI with superimposed use of Ensure and oral supplementation Rx Lokelma 10 g Follow labs a.m. DM TY 2 with diabetic neuropathy CBGs ranging 130 through 200 Continue sliding scale with 55 units long-acting nightly 3 times daily coverage  Actos 45 mg Resume metformin in the a.m. HTN Continue Norvasc 10 mg Home meds indapamide, Lasix, quinapril all on hold Bradycardia first-degree AV block Seen in setting of sleep apnea Supposed to get a Zio patch on  discharge OSA compliant on CPAP Superobesity BMI 63 Mandatory CPAP when resting Get Kreg bed  DVT prophylaxis: Resume heparin as per orthopedic Code status: Full code Family Communication: None at bedside seen in PACU Disposition:  Status is: Inpatient  Remains inpatient appropriate because:Hemodynamically unstable, Ongoing active pain requiring inpatient pain management, and Altered mental status  Dispo:  Patient From:  Home  Planned Disposition:  Inpatient Rehab  Medically stable for discharge:  No          Consultants:  Orthopedics  Procedures:   As above  Antimicrobials: Perioperative   Subjective: Awake alert complains of not being able to breathe well despite satting in the 96 range in PACU Does not feel pain Asks about further surgeries  Objective: Vitals:   07/07/21 0809 07/07/21 1629 07/07/21 2048 07/08/21 0713  BP: (!) 141/42 (!) 139/44 (!) 150/42 (!) 142/50  Pulse: 73 79 77 98  Resp: 17 17 17 17   Temp: 98.3 F (36.8 C) 98.1 F (36.7 C) 98.8 F (37.1 C) 98.4 F (36.9 C)  TempSrc: Oral Oral Oral Oral  SpO2: 97% 98% 95% 96%  Weight:      Height:        Intake/Output Summary (Last 24 hours) at 07/08/2021 0745 Last data filed at 07/07/2021 2100 Gross per 24 hour  Intake --  Output 1250 ml  Net -1250 ml   Filed Weights   06/11/21 1326 06/25/21 0900 07/01/21 1518  Weight: (!) 208.7 kg (!) 218 kg (!) 218 kg    Examination:  EOMI NCAT no focal deficit CTA B no added sound no rales no rhonchi S1-S2 no murmur no rub no gallop Abdomen obese External fixator still in place  on left lower extremity wound VAC in place neurologically grossly intact  Data Reviewed: personally reviewed   CBC    Component Value Date/Time   WBC 4.8 07/08/2021 0137   RBC 2.85 (L) 07/08/2021 0137   HGB 8.2 (L) 07/08/2021 0137   HCT 27.0 (L) 07/08/2021 0137   PLT 250 07/08/2021 0137   MCV 94.7 07/08/2021 0137   MCH 28.8 07/08/2021 0137   MCHC 30.4  07/08/2021 0137   RDW 16.2 (H) 07/08/2021 0137   LYMPHSABS 1.7 06/17/2021 0325   MONOABS 0.7 06/17/2021 0325   EOSABS 0.4 06/17/2021 0325   BASOSABS 0.1 06/17/2021 0325   CMP Latest Ref Rng & Units 07/08/2021 07/07/2021 07/06/2021  Glucose 70 - 99 mg/dL 245(H) 107(H) 132(H)  BUN 8 - 23 mg/dL 57(H) 61(H) 67(H)  Creatinine 0.61 - 1.24 mg/dL 1.21 1.35(H) 1.73(H)  Sodium 135 - 145 mmol/L 134(L) 137 133(L)  Potassium 3.5 - 5.1 mmol/L 5.2(H) 4.9 5.0  Chloride 98 - 111 mmol/L 107 106 104  CO2 22 - 32 mmol/L 23 24 22   Calcium 8.9 - 10.3 mg/dL 8.5(L) 8.8(L) 8.3(L)  Total Protein 6.5 - 8.1 g/dL - - -  Total Bilirubin 0.3 - 1.2 mg/dL - - -  Alkaline Phos 38 - 126 U/L - - -  AST 15 - 41 U/L - - -  ALT 0 - 44 U/L - - -     Radiology Studies: No results found.   Scheduled Meds:  (feeding supplement) PROSource Plus  30 mL Oral BID BM   amLODipine  10 mg Oral Daily   cephALEXin  250 mg Oral QHS   docusate sodium  100 mg Oral BID   feeding supplement (GLUCERNA SHAKE)  237 mL Oral Q24H   FLUoxetine  40 mg Oral QPM   insulin aspart  0-20 Units Subcutaneous TID WC   insulin aspart  0-5 Units Subcutaneous QHS   insulin aspart  3 Units Subcutaneous TID WC   insulin glargine-yfgn  55 Units Subcutaneous QHS   methocarbamol  1,000 mg Oral BID   multivitamin with minerals  1 tablet Oral Daily   nutrition supplement (JUVEN)  1 packet Oral BID BM   pantoprazole  40 mg Oral Daily   pioglitazone  45 mg Oral Daily   polyethylene glycol  17 g Oral BID   povidone-iodine   Topical Once   povidone-iodine  2 application Topical Once   Ensure Max Protein  11 oz Oral Daily   senna  1 tablet Oral Daily   tamsulosin  0.4 mg Oral Daily   tranexamic acid (CYKLOKAPRON) topical - INTRAOP  2,000 mg Topical To OR   Continuous Infusions:  sodium chloride      ceFAZolin (ANCEF) IV     heparin 2,600 Units/hr (07/07/21 1048)   methocarbamol (ROBAXIN) IV 500 mg (07/01/21 2138)   tranexamic acid       LOS:  26 days   Time spent: [place], MD [facility] To contact the attending provider between 7A-7P or the covering provider during after hours 7P-7A, please log into the web site [email or web address] and access using universal [place] password for that web site. If you do not have the password, please call the hospital operator.  07/08/2021, 7:45 AM

## 2021-07-08 NOTE — Anesthesia Preprocedure Evaluation (Addendum)
Anesthesia Evaluation  Patient identified by MRN, date of birth, ID band Patient awake    Reviewed: Allergy & Precautions, NPO status , Patient's Chart, lab work & pertinent test results  History of Anesthesia Complications Negative for: history of anesthetic complications  Airway Mallampati: II  TM Distance: >3 FB Neck ROM: Full    Dental  (+) Chipped, Missing, Dental Advisory Given, Poor Dentition   Pulmonary sleep apnea and Continuous Positive Airway Pressure Ventilation ,    breath sounds clear to auscultation       Cardiovascular hypertension, Pt. on medications (-) angina+ DVT   Rhythm:Regular Rate:Normal  !0/03/2021 ECHO: EF 60-65%, normal LVF, no significant valvular abnormalities   Neuro/Psych Anxiety Depression negative neurological ROS     GI/Hepatic Neg liver ROS, GERD  Medicated and Controlled,  Endo/Other  diabetes (glu 136), Insulin Dependent, Oral Hypoglycemic AgentsMorbid obesity  Renal/GU Renal InsufficiencyRenal disease     Musculoskeletal   Abdominal (+) + obese,   Peds  Hematology  (+) Blood dyscrasia (Hb 8.2), anemia , coumadin   Anesthesia Other Findings   Reproductive/Obstetrics                           Anesthesia Physical Anesthesia Plan  ASA: 4  Anesthesia Plan: General   Post-op Pain Management:    Induction: Intravenous  PONV Risk Score and Plan: 2 and Ondansetron and Dexamethasone  Airway Management Planned: Oral ETT  Additional Equipment: None  Intra-op Plan:   Post-operative Plan: Extubation in OR  Informed Consent: I have reviewed the patients History and Physical, chart, labs and discussed the procedure including the risks, benefits and alternatives for the proposed anesthesia with the patient or authorized representative who has indicated his/her understanding and acceptance.     Dental advisory given  Plan Discussed with: CRNA and  Surgeon  Anesthesia Plan Comments:        Anesthesia Quick Evaluation

## 2021-07-08 NOTE — Interval H&P Note (Signed)
History and Physical Interval Note:  07/08/2021 6:34 AM  Joseph [name].  has presented today for surgery, with the diagnosis of Left Thigh Ulcer.  The various methods of treatment have been discussed with the patient and family. After consideration of risks, benefits and other options for treatment, the patient has consented to  Procedure(s): LEFT THIGH DEBRIDEMENT (Left) as a surgical intervention.  The patient's history has been reviewed, patient examined, no change in status, stable for surgery.  I have reviewed the patient's chart and labs.  Questions were answered to the patient's satisfaction.     Newt Minion

## 2021-07-08 NOTE — Progress Notes (Signed)
Nutrition Follow-up  DOCUMENTATION CODES:   Morbid obesity  INTERVENTION:   -Once diet is advanced, resume:  -30 ml ProSource Plus BID, each supplement provides 100 kcals and 15 grams protein -Glucerna Shake daily, each supplement provides 220 kcal and 10 grams of protein  -Ensure Max daily, each supplement provides 150 kcal and 30 grams of protein.   -MVI with minerals daily.   NUTRITION DIAGNOSIS:   Inadequate oral intake related to decreased appetite as evidenced by per patient/family report.  Ongoing  GOAL:   Patient will meet greater than or equal to 90% of their needs  Progressing   MONITOR:   PO intake, Supplement acceptance, Labs, Weight trends, Skin  REASON FOR ASSESSMENT:   Consult Assessment of nutrition requirement/status  ASSESSMENT:   65 y.o. male with medical history of morbid obesity, anxiety, depression, hx L knee DVT, PE (on chronic Coumadin), GERD, HTN, IDA, cervical spine stenosis, non-specific myalgias/myositis, OSA on CPAP, PVD, polyneuropathy, type 2 DM, HLD, and multiple falls. He presented to the ED due to a fall on 9/15. He was admitted for pain control and to monitor L leg for development of compartment syndrome.  10/6- s/p PROCEDURES:  1.  Left knee extensive debridement using scalpel, curette, and rongeur of devitalized skin, subcutaneous tissue, muscle and fascia. 2.  Reduction of knee dislocation. 3.  External fixation spanning of knee dislocation. 4.  Application of wound VAC over an area 23 x 18 cm.  Reviewed I/O's: -1.3 L x 24 hours and -1.1 L since 06/24/21  UOP: 1.3 L x 24 hours  Per orthopedics notes, plan for debridement of lt thigh today. Pt is currently NPO for procedure.   Pt remains with good oral intake. Noted meal completions 75-100%. Pt continues to take supplements well.   Noted Juven was ordered yesterday; RD will d/c secondary to national shortage.  Reviewed wt hx; wt has been stable over the past week.    Medications reviewed and include colace, miralax, and senokot.   Labs reviewed: Na: 134, K: 5.2, CBGS: 164-186 (inpatient orders for glycemic control are 45 mg pioglitazone daily, 0-20 units insulin aspart TID with meals, 0-5 units insulin aspart daily at bedtime, 3 units insulin aspart TID with meals, and 55 units insulin glargine-yfgn daily at bedtime).    Diet Order:   Diet Order             Diet NPO time specified  Diet effective midnight                   EDUCATION NEEDS:   No education needs have been identified at this time  Skin:  Skin Assessment: Skin Integrity Issues: Skin Integrity Issues:: Other (Comment), Stage II, Wound VAC Stage II: nose Wound Vac: lt leg Other: venous stasis ulcers to rt and lt pretibial areas; open blister to lt anterior thigh; skin tear to rt buttock  Last BM:  07/04/21  Height:   Ht Readings from Last 1 Encounters:  07/01/21 6\' 1"  (1.854 m)    Weight:   Wt Readings from Last 1 Encounters:  07/01/21 (!) 218 kg    Ideal Body Weight:  83.6 kg  BMI:  Body mass index is 63.41 kg/m.  Estimated Nutritional Needs:   Kcal:  2300-2500 kcal  Protein:  115-135 grams  Fluid:  >/= 2.4 L/day    Loistine Chance, RD, LDN, CDCES Registered Dietitian II Certified Diabetes Care and Education Specialist Please refer to [facility] for RD and/or RD on-call/weekend/after hours  pager

## 2021-07-08 NOTE — Progress Notes (Signed)
OT Cancellation Note  Patient Details Name: Joseph [name]. MRN: [id number] DOB: 03/26/1956   Cancelled Treatment:    Reason Eval/Treat Not Completed: Patient at procedure or test/ unavailable. Pt presenting for surgery today. Will follow up as able.  Helane Gunther, OT/L  Acute Rehab [place] 07/08/2021, 9:51 AM

## 2021-07-08 NOTE — Progress Notes (Incomplete)
Respiratory called again a

## 2021-07-08 NOTE — Anesthesia Procedure Notes (Signed)
Procedure Name: Intubation Date/Time: 07/08/2021 11:34 AM Performed by: Annamary Carolin, CRNA Pre-anesthesia Checklist: Patient identified, Emergency Drugs available, Suction available and Patient being monitored Patient Re-evaluated:Patient Re-evaluated prior to induction Oxygen Delivery Method: Circle System Utilized Preoxygenation: Pre-oxygenation with 100% oxygen Induction Type: IV induction Ventilation: Two handed mask ventilation required Laryngoscope Size: Glidescope and 4 Grade View: Grade I Tube type: Oral Tube size: 8.0 mm Number of attempts: 1 Airway Equipment and Method: Stylet Placement Confirmation: ETT inserted through vocal cords under direct vision, positive ETCO2 and breath sounds checked- equal and bilateral Secured at: 22 cm Tube secured with: Tape Dental Injury: Teeth and Oropharynx as per pre-operative assessment  Comments: With ease

## 2021-07-08 NOTE — Progress Notes (Signed)
PT Cancellation Note  Patient Details Name: Joseph [name]. MRN: [id number] DOB: 03-10-1956   Cancelled Treatment:    Reason Eval/Treat Not Completed: Patient at procedure or test/unavailable  OR today;  Will follow postop,   Roney Marion, PT  Acute Rehabilitation Services Pager [phone] Office [phone]    [place] 07/08/2021, 12:58 PM

## 2021-07-08 NOTE — Progress Notes (Signed)
Spoke w/ Dr Sharol Given & he ordered to stop heparin drip; pt for surgery this morning.

## 2021-07-08 NOTE — Progress Notes (Signed)
RT note. Patient placed on hospital CPAP machine. Patient placed on auto titrate 25/5 with 6L bled in line due to desat on patient home cpap. Patient currently sat 95%, RT will continue to monitor.

## 2021-07-09 ENCOUNTER — Telehealth: Payer: Self-pay | Admitting: Internal Medicine

## 2021-07-09 ENCOUNTER — Encounter (HOSPITAL_COMMUNITY): Payer: Self-pay | Admitting: Orthopedic Surgery

## 2021-07-09 DIAGNOSIS — S83105A Unspecified dislocation of left knee, initial encounter: Secondary | ICD-10-CM | POA: Diagnosis not present

## 2021-07-09 LAB — CBC
HCT: 26.3 % — ABNORMAL LOW (ref 39.0–52.0)
Hemoglobin: 7.6 g/dL — ABNORMAL LOW (ref 13.0–17.0)
MCH: 28.1 pg (ref 26.0–34.0)
MCHC: 28.9 g/dL — ABNORMAL LOW (ref 30.0–36.0)
MCV: 97.4 fL (ref 80.0–100.0)
Platelets: 253 10*3/uL (ref 150–400)
RBC: 2.7 MIL/uL — ABNORMAL LOW (ref 4.22–5.81)
RDW: 16.3 % — ABNORMAL HIGH (ref 11.5–15.5)
WBC: 5.2 10*3/uL (ref 4.0–10.5)
nRBC: 0.4 % — ABNORMAL HIGH (ref 0.0–0.2)

## 2021-07-09 LAB — BASIC METABOLIC PANEL
Anion gap: 6 (ref 5–15)
BUN: 53 mg/dL — ABNORMAL HIGH (ref 8–23)
CO2: 21 mmol/L — ABNORMAL LOW (ref 22–32)
Calcium: 8.7 mg/dL — ABNORMAL LOW (ref 8.9–10.3)
Chloride: 108 mmol/L (ref 98–111)
Creatinine, Ser: 1.13 mg/dL (ref 0.61–1.24)
GFR, Estimated: >60 mL/min (ref >60)
Glucose, Bld: 221 mg/dL — ABNORMAL HIGH (ref 70–99)
Potassium: 5.3 mmol/L — ABNORMAL HIGH (ref 3.5–5.1)
Sodium: 135 mmol/L (ref 135–145)

## 2021-07-09 LAB — GLUCOSE, CAPILLARY
Glucose-Capillary: 199 mg/dL — ABNORMAL HIGH (ref 70–99)
Glucose-Capillary: 237 mg/dL — ABNORMAL HIGH (ref 70–99)
Glucose-Capillary: 236 mg/dL — ABNORMAL HIGH (ref 70–99)
Glucose-Capillary: 222 mg/dL — ABNORMAL HIGH (ref 70–99)

## 2021-07-09 LAB — PROTIME-INR
INR: 1.3 — ABNORMAL HIGH (ref 0.8–1.2)
Prothrombin Time: 15.8 seconds — ABNORMAL HIGH (ref 11.4–15.2)

## 2021-07-09 LAB — HEPARIN LEVEL (UNFRACTIONATED): Heparin Unfractionated: <0.1 IU/mL — ABNORMAL LOW (ref 0.30–0.70)

## 2021-07-09 MED ORDER — TRANEXAMIC ACID 1000 MG/10ML IV SOLN
2000.0000 mg | INTRAVENOUS | Status: DC
  Filled 2021-07-09 (×2): qty 20

## 2021-07-09 MED ORDER — HEPARIN (PORCINE) 25000 UT/250ML-% IV SOLN
2600.0000 [IU]/h | INTRAVENOUS | Status: DC
  Administered 2021-07-09: 2350 [IU]/h via INTRAVENOUS
  Administered 2021-07-10: 2600 [IU]/h via INTRAVENOUS
  Filled 2021-07-09 (×2): qty 250

## 2021-07-09 NOTE — Progress Notes (Signed)
Patient ID: Joseph [name]., male   DOB: 01-11-1956, 65 y.o.   MRN: [id number] Patient is postoperative day 1 debridement left knee wound.  The wound bed was healthy with good granulation tissue no signs of infection.  The external fixator was also adjusted to take pressure off the pannus.  There is approximately 200 cc in the wound VAC canister.  The wound VAC was off not plugged then I have plugged in the [facility] but the dressing does not have a good suction fit this should be good until repeat surgery tomorrow Friday.

## 2021-07-09 NOTE — H&P (View-Only) (Signed)
Patient ID: Joseph Gill., male   DOB: 06/28/1956, 65 y.o.   MRN: [id number] Patient is postoperative day 1 debridement left knee wound.  The wound bed was healthy with good granulation tissue no signs of infection.  The external fixator was also adjusted to take pressure off the pannus.  There is approximately 200 cc in the wound VAC canister.  The wound VAC was off not plugged then I have plugged in the [facility] but the dressing does not have a good suction fit this should be good until repeat surgery tomorrow Friday.

## 2021-07-09 NOTE — Progress Notes (Signed)
RT note. Patient placed on bipap at this time. Per RN patient desat to 80s while wearing cpap/dreamstation while on auto titrate & 6L bled in line. Patient currently on 12/8 60% R8 sat 97% and resting comfortable. RT will continue to monitor.

## 2021-07-09 NOTE — Progress Notes (Signed)
Patient wife asked this nurse if patient can be put under local anesthesia during procedure and also said to this nurse if the CPAP can not be brought down to surgery then the patient does not want to go forth with the surgery  Shelbie Proctor, RN

## 2021-07-09 NOTE — Progress Notes (Signed)
Occupational Therapy Treatment Patient Details Name: Joseph [name]. MRN: [id number] DOB: [birth year] Today's Date: 07/09/2021   History of present illness Tayvin Preslar is a 65 yo male who presnets with increased LLE swelling after recent fall at home. 9/15 pt fell when getting off x-ray table at hospital. X-rays showed a knee dislocation which was confirmed on CT scan as a posterior lateral dislocation; L knee reduced and L KI placed. Lumbar and pelvic xrays negative. 10/5: left knee dislocation reduction with external fixation placement and extensive debridement of hematoma and devitalized skin subtenons tissue muscle fascia Placement of wound VAC; Wound debridement on 10/12. Plan to return to OR on 10/14.  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   OT comments  Pt seen with PT for attempts at mobility however pt declined OOB or moving to EOB due to pain associated with LLE wound vac change. Focus of session on bed level activity/exercise. Pt issued theraband to work on Barrister's clerk. Trendelenburg feature used to increase HOB in modified seated position to improve "sitting" tolerance and respiratory status. Sheet positioned under pannus to wick away moisture to reduce risk of MASD. Pt on CPAP with 8L during session. Without CPAP SpO2 @ 87 at times.  Recommend rehab at SNF. We reach out to [facility] bed company regarding tilt bed. Will continue to follow acutely.   Recommendations for follow up therapy are one component of a multi-disciplinary discharge planning process, led by the attending physician.  Recommendations may be updated based on patient status, additional functional criteria and insurance authorization.    Follow Up Recommendations  SNF    Equipment Recommendations  3 in 1 bedside commode    Recommendations for Other Services      Precautions / Restrictions Precautions Precautions: Fall Precaution Comments: NO knee ROM on L-STRICT,  ex fix Restrictions Weight Bearing Restrictions: Yes LLE Weight Bearing: Non weight bearing       Mobility Bed Mobility Overal bed mobility: Needs Assistance Bed Mobility: Rolling Rolling: +2 for physical assistance;+2 for safety/equipment;Max assist         General bed mobility comments: patient able to initiate roll L/R but requires maxA+2 to complete. TotalA+2 to reposition hips in center of bed. Heavy use of rail; Pannus offset toward L side    Transfers                 General transfer comment: Pt declined OOB moblity due to pain    Balance                                           ADL either performed or assessed with clinical judgement   ADL Overall ADL's : Needs assistance/impaired     Grooming: Set up;Bed level   Upper Body Bathing: Minimal assistance;Sitting                           Functional mobility during ADLs: +2 for physical assistance;Moderate assistance (bed level only; limited by external fixator) General ADL Comments: encouraged pt to complete as much of his ADL tasks as possible; pt assisted with lifting pannus for skin care     Vision       Perception     Praxis      Cognition Arousal/Alertness: Awake/alert Behavior During Therapy: [facility] for tasks  assessed/performed;Anxious Overall Cognitive Status: Within Functional Limits for tasks assessed                                          Exercises Exercises: General Upper Extremity  Shoulder Exercises Shoulder Flexion: Strengthening;Both;15 reps;Supine;Theraband Theraband Level (Shoulder Flexion): Level 1 (Yellow) Elbow Flexion: Strengthening;Both;15 reps;Supine Elbow Extension: Strengthening;Both;15 reps;Supine;Theraband Theraband Level (Elbow Extension): Level 1 (Yellow) TRunk rotation exercises for core strengthening   Shoulder Instructions       General Comments On person Cpap    Pertinent Vitals/ Pain       Pain  Assessment: Faces Faces Pain Scale: Hurts whole lot Pain Location: L knee Pain Descriptors / Indicators: Grimacing;Aching;Sore;Discomfort;Guarding Pain Intervention(s): Limited activity within patient's tolerance;Monitored during session  Home Living                                          Prior Functioning/Environment              Frequency  Min 2X/week        Progress Toward Goals  OT Goals(current goals can now be found in the care plan section)  Progress towards OT goals:  (Goals updated)  Acute Rehab OT Goals Patient Stated Goal: to help move as much as possible OT Goal Formulation: With patient/family Time For Goal Achievement: 07/16/21 Potential to Achieve Goals: Good ADL Goals Pt Will Perform Upper Body Bathing: with set-up;sitting Pt/caregiver will Perform Home Exercise Program: Both right and left upper extremity;With theraband;Independently;With written HEP provided Additional ADL Goal #1: Pt will roll R/L with 1 person min A to help with ADL tasks Additional ADL Goal #2: PT will tolerate standing in Kreg tilt bed for 10 minutes to build up strength in RLE adn improve standing tolerance Additional ADL Goal #3: Pt will verbalize 2 strategies to reduce risk of MASD under pannus  Plan Discharge plan needs to be updated    Co-evaluation    PT/OT/SLP Co-Evaluation/Treatment: Yes Reason for Co-Treatment: Complexity of the patient's impairments (multi-system involvement);For patient/therapist safety;To address functional/ADL transfers PT goals addressed during session: Mobility/safety with mobility;Strengthening/ROM        AM-PAC OT "6 Clicks" Daily Activity     Outcome Measure   Help from another person eating meals?: None Help from another person taking care of personal grooming?: A Little Help from another person toileting, which includes using toliet, bedpan, or urinal?: Total Help from another person bathing (including washing,  rinsing, drying)?: A Lot Help from another person to put on and taking off regular upper body clothing?: A Lot Help from another person to put on and taking off regular lower body clothing?: Total 6 Click Score: 13    End of Session    OT Visit Diagnosis: Unsteadiness on feet (R26.81);Other abnormalities of gait and mobility (R26.89);Muscle weakness (generalized) (M62.81);History of falling (Z91.81);Pain Pain - Right/Left: Left Pain - part of body: Leg   Activity Tolerance Patient limited by pain   Patient Left in bed;with call bell/phone within reach;with family/visitor present   Nurse Communication Mobility status        Time:  -     Charges: OT Treatments $Therapeutic Activity: 8-22 mins  Maurie Boettcher, OT/L   Acute OT Clinical Specialist [place] Pager [phone] Office [phone]   [facility]  07/09/2021, 4:44 PM

## 2021-07-09 NOTE — Telephone Encounter (Addendum)
Spoke to [place] patient's wife.   ----- Message from Ledora Bottcher, [place] sent at 07/03/2021  1:10 PM EDT ----- Please mail zio in 3 weeks.   Dr. Harrington Challenger to read   Please schedule him and appt in 8 weeks with Dr. Harrington Challenger.   Thanks Angie  Triage nurse called last week and this is the phone call she is returning. Benjamine Mola stated that they have not received monitor yet. Advised it may still be too early with the mail. Also patient is currently in the hospital since (9/15) and having a procedure with his knee tomorrow (10/14), not sure how long he will be there and is wearing a monitor there. Wife then notes that he will be going to rehab from there. She is asking if he still needs to wear the heart monitor or just mail it back? Wondering if they can use the information from the hospital stay/monitor. If he does need to wear the monitor still, when should they put it on, should it be worn during or after rehab? Wishes to hold off for now on scheduling follow up, due to not knowing when he will be done with rehab.

## 2021-07-09 NOTE — Progress Notes (Signed)
Physical Therapy Treatment Patient Details Name: Joseph [name]. MRN: [id number] DOB: 17-Mar-1956 Today's Date: 07/09/2021   History of Present Illness Joseph [name] is a 65 yo male who presnets with increased LLE swelling after recent fall at home. 9/15 pt fell when getting off x-ray table at hospital. X-rays showed a knee dislocation which was confirmed on CT scan as a posterior lateral dislocation; L knee reduced and L KI placed. Lumbar and pelvic xrays negative. 10/5: left knee dislocation reduction with external fixation placement and extensive debridement of hematoma and devitalized skin subtenons tissue muscle fascia Placement of wound VAC; Wound debridement on 10/12. Plan to return to OR on 10/14.  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    PT Comments    Patient reporting increased pain in L LE since surgery. Patient declined OOB mobility attempts this session. Worked on rolling L/R with patient initiating and using bed rails to assist. Performed LE exercises in supine. Patient reports difficulty breathing when laying flat. Placed patient in trendelenburg with HOB elevated to relieve pressure off chest. Placed sheet under pannus to prevent skin breakdown due to moisture. Encourage patient to be an active participant in his care during bathing and rolling to change linens. Continue to recommend SNF for ongoing Physical Therapy.       Recommendations for follow up therapy are one component of a multi-disciplinary discharge planning process, led by the attending physician.  Recommendations may be updated based on patient status, additional functional criteria and insurance authorization.  Follow Up Recommendations  SNF     Equipment Recommendations  Other (comment) (TBD)    Recommendations for Other Services       Precautions / Restrictions Precautions Precautions: Fall Precaution Comments: NO knee ROM on L-STRICT, ex  fix Restrictions Weight Bearing Restrictions: Yes LLE Weight Bearing: Non weight bearing     Mobility  Bed Mobility Overal bed mobility: Needs Assistance Bed Mobility: Rolling Rolling: +2 for physical assistance;+2 for safety/equipment;Max assist         General bed mobility comments: patient able to initiate roll L/R but requires maxA+2 to complete. TotalA+2 to reposition hips in center of bed.    Transfers                    Ambulation/Gait                 Stairs             Wheelchair Mobility    Modified Rankin (Stroke Patients Only)       Balance                                            Cognition Arousal/Alertness: Awake/alert Behavior During Therapy: WFL for tasks assessed/performed Overall Cognitive Status: Within Functional Limits for tasks assessed                                        Exercises General Exercises - Lower Extremity Ankle Circles/Pumps: Right;Left;5 reps;Supine Heel Slides: Right;10 reps;Supine    General Comments General comments (skin integrity, edema, etc.): On personal CPAP with 8L bled in. VSS      Pertinent Vitals/Pain Pain Assessment: Faces Faces Pain Scale: Hurts whole lot Pain Location: L  knee Pain Descriptors / Indicators: Grimacing;Aching;Sore;Discomfort;Guarding Pain Intervention(s): Limited activity within patient's tolerance;Monitored during session    Home Living                      Prior Function            PT Goals (current goals can now be found in the care plan section) Acute Rehab PT Goals Patient Stated Goal: to help move as much as possible PT Goal Formulation: With patient/family Time For Goal Achievement: 07/20/21 Potential to Achieve Goals: Fair Progress towards PT goals: Progressing toward goals    Frequency    Min 3X/week      PT Plan Current plan remains appropriate    Co-evaluation PT/OT/SLP  Co-Evaluation/Treatment: Yes Reason for Co-Treatment: For patient/therapist safety;To address functional/ADL transfers PT goals addressed during session: Mobility/safety with mobility;Strengthening/ROM        AM-PAC PT "6 Clicks" Mobility   Outcome Measure  Help needed turning from your back to your side while in a flat bed without using bedrails?: Total Help needed moving from lying on your back to sitting on the side of a flat bed without using bedrails?: Total Help needed moving to and from a bed to a chair (including a wheelchair)?: Total Help needed standing up from a chair using your arms (e.g., wheelchair or bedside chair)?: Total Help needed to walk in hospital room?: Total Help needed climbing 3-5 steps with a railing? : Total 6 Click Score: 6    End of Session Equipment Utilized During Treatment: Oxygen Activity Tolerance: Patient tolerated treatment well;Patient limited by pain Patient left: in bed;with call bell/phone within reach Nurse Communication: Need for lift equipment PT Visit Diagnosis: Other abnormalities of gait and mobility (R26.89);Muscle weakness (generalized) (M62.81);History of falling (Z91.81);Repeated falls (R29.6);Pain Pain - Right/Left: Left Pain - part of body: Knee;Leg     Time: 1132-1202 PT Time Calculation (min) (ACUTE ONLY): 30 min  Charges:  $Therapeutic Exercise: 8-22 mins                     [name] PT, DPT Acute Rehabilitation Services Pager [phone] Office [phone]    Linna Hoff 07/09/2021, 1:34 PM

## 2021-07-09 NOTE — Telephone Encounter (Signed)
Patient's wife called and mentioned that patient is still in the hospital and she was returning a phone call from a nurse. Please call back

## 2021-07-09 NOTE — Progress Notes (Signed)
PROGRESS NOTE   Joseph [name].  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 Bedelia Person based course  Left knee dislocation + left knee reduction external fixation with hematoma evacuation 10/5 s/p knee debridement reduction of location external fixation with VAC 10/12 revision of I&D of thigh ulcer Will need further surgery under Dr. Sharol Given 10/14 Current pain meds include Oxy IR 5 every 4 as needed pain 1 to 410 mg every 4 as needed severe pain as well as Dilaudid every 3 as needed 7-10 pain May also use Robaxin as needed for pain Pain seems mod controlled Pulmonary embolism on Coumadin at home Resume heparin drip and Coumadin as per orthopedics INR 1.3 AKI on admit Peak creatinine 2.37, continues to improve Continue saline 50 cc/h, periodic labs Hyperkalemia Possibly secondary to AKI with superimposed use of Ensure and oral supplementation Rx Lokelma 10 g Potassium remains low 5 range--monitor DM TY 2 with diabetic neuropathy CBGs ranging 199-240 Continue sliding scale with 55 units long-acting nightly 3 times daily coverage  Actos 45 mg Resume metformin when creatinine is at baselione HTN Continue Norvasc 10 mg Home meds indapamide, Lasix, quinapril all on hold Bradycardia first-degree AV block Seen in setting of sleep  apnea Supposed to get a Zio patch on discharge OSA compliant on CPAP Superobesity BMI 63 Mandatory CPAP when resting Get Kreg bed  DVT prophylaxis: Resume heparin as per orthopedic Code status: Full code Family Communication: None at bedside seen in PACU Disposition:  Status is: Inpatient  Remains inpatient appropriate because:Hemodynamically unstable, Ongoing active pain requiring inpatient pain management, and Altered mental status  Dispo:  Patient From:  Home  Planned Disposition:  Inpatient Rehab  Medically stable for discharge:  No          Consultants:  Orthopedics  Procedures:   As above  Antimicrobials: Perioperative   Subjective:  Coherent no distress Eating some Pain manageable No cp Objective: Vitals:   07/09/21 0424 07/09/21 0747 07/09/21 1254 07/09/21 1539  BP: (!) 134/59 (!) 140/38 (!) 161/38 (!) 146/41  Pulse: 80 78 80 77  Resp: 17 14 20 19   Temp: 98 F (36.7 C) 98.2 F (36.8 C) 97.9 F (36.6 C) 97.6 F (36.4 C)  TempSrc: Axillary Oral Oral Oral  SpO2: 100% 94% (!) 86% 95%  Weight:      Height:        Intake/Output Summary (Last 24 hours) at 07/09/2021 1604 Last data filed at 07/09/2021 1018 Gross per 24 hour  Intake 400 ml  Output 625 ml  Net -225 ml    Filed Weights   06/25/21 0900 07/01/21 1518 07/08/21 0958  Weight: (!) 218 kg (!) 218 kg (!) 218 kg    Examination:  EOMI NCAT no focal deficit CTA B no added sound no rales no rhonchi S1-S2 no murmur no rub no gallop Abdomen obese External fixator still in place on left lower extremity wound VAC in place  neurologically grossly intact  Data Reviewed: personally reviewed   CBC    Component Value Date/Time   WBC 5.2 07/09/2021 0135   RBC 2.70 (L) 07/09/2021 0135   HGB 7.6 (L) 07/09/2021 0135   HCT 26.3 (L) 07/09/2021 0135   PLT 253 07/09/2021 0135   MCV 97.4 07/09/2021 0135   MCH 28.1 07/09/2021 0135   MCHC 28.9 (L) 07/09/2021 0135   RDW 16.3 (H) 07/09/2021  0135   LYMPHSABS 1.7 06/17/2021 0325   MONOABS 0.7 06/17/2021 0325   EOSABS 0.4 06/17/2021 0325   BASOSABS 0.1 06/17/2021 0325   CMP Latest Ref Rng & Units 07/09/2021 07/08/2021 07/07/2021  Glucose 70 - 99 mg/dL 221(H) 245(H) 107(H)  BUN 8 - 23 mg/dL 53(H) 57(H) 61(H)  Creatinine 0.61 - 1.24 mg/dL 1.13 1.21 1.35(H)  Sodium 135 - 145 mmol/L 135 134(L) 137  Potassium 3.5 - 5.1 mmol/L 5.3(H) 5.2(H) 4.9  Chloride 98 - 111 mmol/L 108 107 106  CO2 22 - 32 mmol/L 21(L) 23 24  Calcium 8.9 - 10.3 mg/dL 8.7(L) 8.5(L) 8.8(L)  Total Protein 6.5 - 8.1 g/dL - - -  Total Bilirubin 0.3 - 1.2 mg/dL - - -  Alkaline Phos 38 - 126 U/L - - -  AST 15 - 41 U/L - - -  ALT 0 - 44 U/L - - -     Radiology Studies: No results found.   Scheduled Meds:  (feeding supplement) PROSource Plus  30 mL Oral BID BM   amLODipine  10 mg Oral Daily   cephALEXin  250 mg Oral QHS   docusate sodium  100 mg Oral BID   feeding supplement (GLUCERNA SHAKE)  237 mL Oral Q24H   FLUoxetine  40 mg Oral QPM   insulin aspart  0-20 Units Subcutaneous TID WC   insulin aspart  0-5 Units Subcutaneous QHS   insulin aspart  3 Units Subcutaneous TID WC   insulin glargine-yfgn  55 Units Subcutaneous QHS   methocarbamol  1,000 mg Oral BID   multivitamin with minerals  1 tablet Oral Daily   pantoprazole  40 mg Oral Daily   pioglitazone  45 mg Oral Daily   polyethylene glycol  17 g Oral BID   Ensure Max Protein  11 oz Oral Daily   senna  1 tablet Oral Daily   tamsulosin  0.4 mg Oral Daily   [START ON 07/10/2021] tranexamic acid (CYKLOKAPRON) topical - INTRAOP  2,000 mg Topical To OR   Continuous Infusions:  sodium chloride     sodium chloride     methocarbamol (ROBAXIN) IV       LOS: 27 days   Time spent: [place], MD [facility] To contact the attending provider between 7A-7P or the covering provider during after hours 7P-7A, please log into the web site [email or web address] and access using universal  [place] password for that web site. If you do not have the password, please call the hospital operator.  07/09/2021, 4:04 PM

## 2021-07-09 NOTE — Progress Notes (Signed)
  ANTICOAGULATION CONSULT NOTE - Follow Up Consult  Pharmacy Consult for Heparin Indication: h/o pulmonary embolus  Allergies  Allergen Reactions   Tape     Peels skin on legs   Chlorhexidine Rash    Wipes    Patient Measurements: Height: 6\' 1"  (185.4 cm) Weight: (!) 218 kg (480 lb 9.6 oz) IBW/kg (Calculated) : 79.9 Heparin Dosing Weight: 132.5 kg  Vital Signs: Temp: 97.6 F (36.4 C) (10/13 1539) Temp Source: Oral (10/13 1539) BP: 146/41 (10/13 1539) Pulse Rate: 77 (10/13 1539)  Labs: Recent Labs    07/07/21 0237 07/07/21 0744 07/07/21 0744 07/08/21 0137 07/08/21 0146 07/09/21 0135  HGB  --  8.6*   < > 8.2*  --  7.6*  HCT  --  28.9*  --  27.0*  --  26.3*  PLT  --  263  --  250  --  253  LABPROT 15.6*  --   --  16.1*  --  15.8*  INR 1.2  --   --  1.3*  --  1.3*  HEPARINUNFRC 0.49  --   --   --  0.35 <0.10*  CREATININE  --  1.35*  --  1.21  --  1.13   < > = values in this interval not displayed.     Estimated Creatinine Clearance: 126.2 mL/min (by C-G formula based on SCr of 1.13 mg/dL).   Medications:  PTA Warfarin:  5 mg on Sun/Mon/Wed/Fri evening, and 7.5 mg on Tue/Thur/Sat evening.   INR was supratherapeutic (6.2) at admission  Assessment: 80 YOM on lifelong warfarin therapy for hx of PE, body habitus, and sedentary lifestyle admitted on 9/15 with left knee dislocation and effusion concerning for hematoma. At admission, warfarin was held, vitamin K was given for supratherapeutic INR, and pt was transfused.   Heparin had been held post OR on 10/12. Pharmacy consulted to resume heparin today and hold heparin at 0800 on 10/14 in anticipation of return to OR with ortho. Patient previously therapeutic on heparin 2600 units/hr. Per RN hematoma around knee appears stable. No other signs of bleeding.    Goal of Therapy:  Heparin level 0.3-0.7 units/ml Monitor platelets by anticoagulation protocol: Yes   Plan:  Resume heparin per Dr. Verlon Au and hold at 0800  on 10/14  Start heparin at 2350 units/hr  Check 6 hr heparin level   Cristela Felt, PharmD, BCPS Clinical Pharmacist 07/09/2021 5:54 PM

## 2021-07-09 NOTE — Progress Notes (Signed)
  ANTICOAGULATION CONSULT NOTE - Follow Up Consult  Pharmacy Consult for Heparin Indication: h/o pulmonary embolus  Allergies  Allergen Reactions   Tape     Peels skin on legs   Chlorhexidine Rash    Wipes    Patient Measurements: Height: 6\' 1"  (185.4 cm) Weight: (!) 218 kg (480 lb 9.6 oz) IBW/kg (Calculated) : 79.9 Heparin Dosing Weight: 132.5 kg  Vital Signs: Temp: 98.2 F (36.8 C) (10/13 0747) Temp Source: Oral (10/13 0747) BP: 140/38 (10/13 0747) Pulse Rate: 78 (10/13 0747)  Labs: Recent Labs    07/07/21 0237 07/07/21 0744 07/07/21 0744 07/08/21 0137 07/08/21 0146 07/09/21 0135  HGB  --  8.6*   < > 8.2*  --  7.6*  HCT  --  28.9*  --  27.0*  --  26.3*  PLT  --  263  --  250  --  253  LABPROT 15.6*  --   --  16.1*  --  15.8*  INR 1.2  --   --  1.3*  --  1.3*  HEPARINUNFRC 0.49  --   --   --  0.35 <0.10*  CREATININE  --  1.35*  --  1.21  --  1.13   < > = values in this interval not displayed.     Estimated Creatinine Clearance: 126.2 mL/min (by C-G formula based on SCr of 1.13 mg/dL).   Medications:  PTA Warfarin:  5 mg on Sun/Mon/Wed/Fri evening, and 7.5 mg on Tue/Thur/Sat evening.   INR was supratherapeutic (6.2) at admission  Assessment: 56 YOM on lifelong warfarin therapy for hx of PE, body habitus, and sedentary lifestyle admitted on 9/15 with left knee dislocation and effusion concerning for hematoma. At admission, warfarin was held, vitamin K was given for supratherapeutic INR, and pt was transfused.  Most recent pharmacy consult to begin heparin when INR decreased to < 2 while held for surgery. Heparin IV began on 10/5 with an INR of 1.7.   Significant Events: Warfarin held 9/15-9/22 Vitamin K given 9/15-9/17 Warfarin resumed 9/23 with heparin bridge (completed 9/30) Warfarin held 10/3 Heparin started 10/5 for INR < 2, held 10/5 PM for surgery, restarted 10/6 AM Heparin held on 10/12 for surgery   Heparin drip is currently on hold since  yesterday. Going for repeat surgery tomorrow.   PTA warfarin dose: 5mg  MWFSu, 7.5mg  TuThSa   Goal of Therapy:  Heparin level 0.3-0.7 units/ml Monitor platelets by anticoagulation protocol: Yes   Plan:  Per ortho, will continue to hold heparin until after surgery tomorrow  Daily heparin level and CBC Monitor for s/sx bleeding Follow up restart warfarin     Albertina Parr, PharmD., BCPS, BCCCP Clinical Pharmacist Please refer to [facility] for unit-specific pharmacist

## 2021-07-10 ENCOUNTER — Inpatient Hospital Stay (HOSPITAL_COMMUNITY): Payer: HMO | Admitting: Anesthesiology

## 2021-07-10 ENCOUNTER — Encounter (HOSPITAL_COMMUNITY): Payer: Self-pay | Admitting: Internal Medicine

## 2021-07-10 ENCOUNTER — Encounter (HOSPITAL_COMMUNITY)
Admission: EM | Disposition: A | Discharge status: Hospice | Payer: Self-pay | Source: Home / Self Care | Attending: Internal Medicine

## 2021-07-10 DIAGNOSIS — L97824 Non-pressure chronic ulcer of other part of left lower leg with necrosis of bone: Secondary | ICD-10-CM | POA: Diagnosis not present

## 2021-07-10 DIAGNOSIS — S83105A Unspecified dislocation of left knee, initial encounter: Secondary | ICD-10-CM | POA: Diagnosis not present

## 2021-07-10 HISTORY — PX: I & D EXTREMITY: SHX5045

## 2021-07-10 HISTORY — PX: SKIN SPLIT GRAFT: SHX444

## 2021-07-10 LAB — GLUCOSE, CAPILLARY
Glucose-Capillary: 158 mg/dL — ABNORMAL HIGH (ref 70–99)
Glucose-Capillary: 155 mg/dL — ABNORMAL HIGH (ref 70–99)
Glucose-Capillary: 147 mg/dL — ABNORMAL HIGH (ref 70–99)
Glucose-Capillary: 129 mg/dL — ABNORMAL HIGH (ref 70–99)
Glucose-Capillary: 198 mg/dL — ABNORMAL HIGH (ref 70–99)

## 2021-07-10 LAB — COMPREHENSIVE METABOLIC PANEL
ALT: 5 U/L (ref 0–44)
AST: 12 U/L — ABNORMAL LOW (ref 15–41)
Albumin: 1.8 g/dL — ABNORMAL LOW (ref 3.5–5.0)
Alkaline Phosphatase: 78 U/L (ref 38–126)
Anion gap: 4 — ABNORMAL LOW (ref 5–15)
BUN: 57 mg/dL — ABNORMAL HIGH (ref 8–23)
CO2: 25 mmol/L (ref 22–32)
Calcium: 8.9 mg/dL (ref 8.9–10.3)
Chloride: 106 mmol/L (ref 98–111)
Creatinine, Ser: 1.47 mg/dL — ABNORMAL HIGH (ref 0.61–1.24)
GFR, Estimated: 53 mL/min — ABNORMAL LOW (ref >60)
Glucose, Bld: 217 mg/dL — ABNORMAL HIGH (ref 70–99)
Potassium: 5.7 mmol/L — ABNORMAL HIGH (ref 3.5–5.1)
Sodium: 135 mmol/L (ref 135–145)
Total Bilirubin: 0.6 mg/dL (ref 0.3–1.2)
Total Protein: 5.2 g/dL — ABNORMAL LOW (ref 6.5–8.1)

## 2021-07-10 LAB — PROTIME-INR
INR: 1.4 — ABNORMAL HIGH (ref 0.8–1.2)
Prothrombin Time: 16.7 seconds — ABNORMAL HIGH (ref 11.4–15.2)

## 2021-07-10 LAB — CBC WITH DIFFERENTIAL/PLATELET
Abs Immature Granulocytes: 0.04 10*3/uL (ref 0.00–0.07)
Basophils Absolute: 0.1 10*3/uL (ref 0.0–0.1)
Basophils Relative: 1 %
Eosinophils Absolute: 0.3 10*3/uL (ref 0.0–0.5)
Eosinophils Relative: 4 %
HCT: 25.4 % — ABNORMAL LOW (ref 39.0–52.0)
Hemoglobin: 7.6 g/dL — ABNORMAL LOW (ref 13.0–17.0)
Immature Granulocytes: 1 %
Lymphocytes Relative: 21 %
Lymphs Abs: 1.5 10*3/uL (ref 0.7–4.0)
MCH: 29.1 pg (ref 26.0–34.0)
MCHC: 29.9 g/dL — ABNORMAL LOW (ref 30.0–36.0)
MCV: 97.3 fL (ref 80.0–100.0)
Monocytes Absolute: 0.8 10*3/uL (ref 0.1–1.0)
Monocytes Relative: 11 %
Neutro Abs: 4.3 10*3/uL (ref 1.7–7.7)
Neutrophils Relative %: 62 %
Platelets: 267 10*3/uL (ref 150–400)
RBC: 2.61 MIL/uL — ABNORMAL LOW (ref 4.22–5.81)
RDW: 16.2 % — ABNORMAL HIGH (ref 11.5–15.5)
WBC: 6.9 10*3/uL (ref 4.0–10.5)
nRBC: 0.4 % — ABNORMAL HIGH (ref 0.0–0.2)

## 2021-07-10 LAB — HEPARIN LEVEL (UNFRACTIONATED): Heparin Unfractionated: <0.1 IU/mL — ABNORMAL LOW (ref 0.30–0.70)

## 2021-07-10 SURGERY — IRRIGATION AND DEBRIDEMENT EXTREMITY
Anesthesia: General | Laterality: Left

## 2021-07-10 MED ORDER — PROPOFOL 10 MG/ML IV BOLUS
INTRAVENOUS | Status: AC
  Filled 2021-07-10: qty 20

## 2021-07-10 MED ORDER — MIDAZOLAM HCL 2 MG/2ML IJ SOLN
INTRAMUSCULAR | Status: AC
  Filled 2021-07-10: qty 2

## 2021-07-10 MED ORDER — 0.9 % SODIUM CHLORIDE (POUR BTL) OPTIME
TOPICAL | Status: DC | PRN
  Administered 2021-07-10 (×2): 1000 mL

## 2021-07-10 MED ORDER — AMISULPRIDE (ANTIEMETIC) 5 MG/2ML IV SOLN: 10.0000 mg | Freq: Once | INTRAVENOUS | Status: DC | PRN

## 2021-07-10 MED ORDER — FENTANYL CITRATE (PF) 250 MCG/5ML IJ SOLN
INTRAMUSCULAR | Status: AC
  Filled 2021-07-10: qty 5

## 2021-07-10 MED ORDER — ONDANSETRON HCL 4 MG/2ML IJ SOLN: 4.0000 mg | Freq: Once | INTRAMUSCULAR | Status: DC | PRN

## 2021-07-10 MED ORDER — BISACODYL 5 MG PO TBEC: 5.0000 mg | DELAYED_RELEASE_TABLET | Freq: Every day | ORAL | Status: DC | PRN

## 2021-07-10 MED ORDER — GUAIFENESIN-DM 100-10 MG/5ML PO SYRP
15.0000 mL | ORAL_SOLUTION | ORAL | Status: DC | PRN
  Filled 2021-07-10: qty 15

## 2021-07-10 MED ORDER — ALUM & MAG HYDROXIDE-SIMETH 200-200-20 MG/5ML PO SUSP: 15.0000 mL | ORAL | Status: DC | PRN

## 2021-07-10 MED ORDER — FENTANYL CITRATE (PF) 100 MCG/2ML IJ SOLN
INTRAMUSCULAR | Status: DC | PRN
  Administered 2021-07-10: 50 ug via INTRAVENOUS

## 2021-07-10 MED ORDER — POLYETHYLENE GLYCOL 3350 17 G PO PACK
17.0000 g | PACK | Freq: Every day | ORAL | Status: DC | PRN
  Administered 2021-07-27: 17 g via ORAL

## 2021-07-10 MED ORDER — CHLORHEXIDINE GLUCONATE 0.12 % MT SOLN
OROMUCOSAL | Status: AC
  Administered 2021-07-10: 15 mL
  Filled 2021-07-10: qty 15

## 2021-07-10 MED ORDER — LIDOCAINE 2% (20 MG/ML) 5 ML SYRINGE
INTRAMUSCULAR | Status: DC | PRN
  Administered 2021-07-10: 60 mg via INTRAVENOUS

## 2021-07-10 MED ORDER — CEFAZOLIN SODIUM-DEXTROSE 2-4 GM/100ML-% IV SOLN
2.0000 g | Freq: Three times a day (TID) | INTRAVENOUS | Status: AC
  Administered 2021-07-10 – 2021-07-15 (×15): 2 g via INTRAVENOUS
  Filled 2021-07-10 (×15): qty 100

## 2021-07-10 MED ORDER — TRANEXAMIC ACID-NACL 1000-0.7 MG/100ML-% IV SOLN
INTRAVENOUS | Status: DC | PRN
  Administered 2021-07-10: 1000 mg via INTRAVENOUS

## 2021-07-10 MED ORDER — JUVEN PO PACK
1.0000 | PACK | Freq: Two times a day (BID) | ORAL | Status: DC
Start: 2021-07-11 — End: 2021-07-28
  Administered 2021-07-11 – 2021-07-28 (×29): 1 via ORAL
  Filled 2021-07-10 (×32): qty 1

## 2021-07-10 MED ORDER — HYDRALAZINE HCL 20 MG/ML IJ SOLN: 5.0000 mg | INTRAMUSCULAR | Status: DC | PRN

## 2021-07-10 MED ORDER — PANTOPRAZOLE SODIUM 40 MG PO TBEC
40.0000 mg | DELAYED_RELEASE_TABLET | Freq: Every day | ORAL | Status: DC
  Administered 2021-07-10 – 2021-07-27 (×18): 40 mg via ORAL
  Filled 2021-07-10 (×18): qty 1

## 2021-07-10 MED ORDER — TRANEXAMIC ACID 1000 MG/10ML IV SOLN
2000.0000 mg | Freq: Once | INTRAVENOUS | Status: DC
  Filled 2021-07-10: qty 20

## 2021-07-10 MED ORDER — DOCUSATE SODIUM 100 MG PO CAPS
100.0000 mg | ORAL_CAPSULE | Freq: Every day | ORAL | Status: DC
  Administered 2021-07-11 – 2021-07-28 (×10): 100 mg via ORAL
  Filled 2021-07-10 (×14): qty 1

## 2021-07-10 MED ORDER — LACTATED RINGERS IV SOLN: INTRAVENOUS | Status: DC

## 2021-07-10 MED ORDER — CEFAZOLIN IN SODIUM CHLORIDE 3-0.9 GM/100ML-% IV SOLN
INTRAVENOUS | Status: AC
  Filled 2021-07-10: qty 100

## 2021-07-10 MED ORDER — PROPOFOL 10 MG/ML IV BOLUS
INTRAVENOUS | Status: DC | PRN
  Administered 2021-07-10: 200 mg via INTRAVENOUS

## 2021-07-10 MED ORDER — ASCORBIC ACID 500 MG PO TABS
1000.0000 mg | ORAL_TABLET | Freq: Every day | ORAL | Status: DC
  Administered 2021-07-10 – 2021-07-29 (×20): 1000 mg via ORAL
  Filled 2021-07-10 (×20): qty 2

## 2021-07-10 MED ORDER — CEPHALEXIN 250 MG PO CAPS
250.0000 mg | ORAL_CAPSULE | Freq: Every day | ORAL | Status: DC
  Administered 2021-07-15 – 2021-07-26 (×12): 250 mg via ORAL
  Filled 2021-07-10 (×13): qty 1

## 2021-07-10 MED ORDER — ONDANSETRON HCL 4 MG/2ML IJ SOLN
INTRAMUSCULAR | Status: DC | PRN
  Administered 2021-07-10: 4 mg via INTRAVENOUS

## 2021-07-10 MED ORDER — OXYCODONE HCL 5 MG PO TABS: 5.0000 mg | ORAL_TABLET | Freq: Once | ORAL | Status: DC | PRN

## 2021-07-10 MED ORDER — LABETALOL HCL 5 MG/ML IV SOLN: 10.0000 mg | INTRAVENOUS | Status: DC | PRN

## 2021-07-10 MED ORDER — SODIUM CHLORIDE 0.9 % IV SOLN: INTRAVENOUS | Status: DC

## 2021-07-10 MED ORDER — CEFAZOLIN IN SODIUM CHLORIDE 3-0.9 GM/100ML-% IV SOLN
3.0000 g | Freq: Once | INTRAVENOUS | Status: AC
  Administered 2021-07-10: 3 g via INTRAVENOUS

## 2021-07-10 MED ORDER — ZINC SULFATE 220 (50 ZN) MG PO CAPS
220.0000 mg | ORAL_CAPSULE | Freq: Every day | ORAL | Status: AC
  Administered 2021-07-10 – 2021-07-23 (×14): 220 mg via ORAL
  Filled 2021-07-10 (×14): qty 1

## 2021-07-10 MED ORDER — FENTANYL CITRATE (PF) 100 MCG/2ML IJ SOLN: 25.0000 ug | INTRAMUSCULAR | Status: DC | PRN

## 2021-07-10 MED ORDER — ONDANSETRON HCL 4 MG/2ML IJ SOLN: 4.0000 mg | Freq: Four times a day (QID) | INTRAMUSCULAR | Status: DC | PRN

## 2021-07-10 MED ORDER — METOPROLOL TARTRATE 5 MG/5ML IV SOLN: 2.0000 mg | INTRAVENOUS | Status: DC | PRN

## 2021-07-10 MED ORDER — MAGNESIUM SULFATE 2 GM/50ML IV SOLN
2.0000 g | Freq: Every day | INTRAVENOUS | Status: DC | PRN
Start: 2021-07-10 — End: 2021-07-19

## 2021-07-10 MED ORDER — PHENOL 1.4 % MT LIQD
1.0000 | OROMUCOSAL | Status: DC | PRN
  Administered 2021-07-17: 1 via OROMUCOSAL
  Filled 2021-07-10: qty 177

## 2021-07-10 MED ORDER — POTASSIUM CHLORIDE CRYS ER 20 MEQ PO TBCR: 20.0000 meq | EXTENDED_RELEASE_TABLET | Freq: Every day | ORAL | Status: DC | PRN

## 2021-07-10 MED ORDER — MAGNESIUM CITRATE PO SOLN
1.0000 | Freq: Once | ORAL | Status: DC | PRN
  Filled 2021-07-10: qty 296

## 2021-07-10 MED ORDER — HEPARIN (PORCINE) 25000 UT/250ML-% IV SOLN
2900.0000 [IU]/h | INTRAVENOUS | Status: DC
  Administered 2021-07-10: 2600 [IU]/h via INTRAVENOUS
  Administered 2021-07-11 (×2): 2750 [IU]/h via INTRAVENOUS
  Administered 2021-07-11: 2600 [IU]/h via INTRAVENOUS
  Administered 2021-07-12 – 2021-07-16 (×9): 2900 [IU]/h via INTRAVENOUS
  Filled 2021-07-10 (×16): qty 250

## 2021-07-10 MED ORDER — OXYCODONE HCL 5 MG/5ML PO SOLN: 5.0000 mg | Freq: Once | ORAL | Status: DC | PRN

## 2021-07-10 SURGICAL SUPPLY — 56 items
ALLOGRAFT SKIN MESHD 387 SQ CM (Graft) ×1 IMPLANT
BAG COUNTER SPONGE SURGICOUNT (BAG) ×2 IMPLANT
BLADE SURG 21 STRL SS (BLADE) ×2 IMPLANT
BNDG COHESIVE 6X5 TAN STRL LF (GAUZE/BANDAGES/DRESSINGS) IMPLANT
BNDG ESMARK 4X9 LF (GAUZE/BANDAGES/DRESSINGS) ×1 IMPLANT
BNDG GAUZE ELAST 4 BULKY (GAUZE/BANDAGES/DRESSINGS) ×2 IMPLANT
CANISTER WOUND CARE 500ML ATS (WOUND CARE) ×1 IMPLANT
COVER SURGICAL LIGHT HANDLE (MISCELLANEOUS) ×4 IMPLANT
CUFF TOURN SGL QUICK 18X4 (TOURNIQUET CUFF) IMPLANT
CUFF TOURN SGL QUICK 24 (TOURNIQUET CUFF)
CUFF TRNQT CYL 24X4X16.5-23 (TOURNIQUET CUFF) IMPLANT
DERMACARRIERS GRAFT 1 TO 1.5 (DISPOSABLE)
DRAPE DERMATAC (DRAPES) ×2 IMPLANT
DRAPE U-SHAPE 47X51 STRL (DRAPES) ×2 IMPLANT
DRESSING VERAFLO CLEANS CC MED (GAUZE/BANDAGES/DRESSINGS) IMPLANT
DRSG ADAPTIC 3X8 NADH LF (GAUZE/BANDAGES/DRESSINGS) ×1 IMPLANT
DRSG MEPITEL 4X7.2 (GAUZE/BANDAGES/DRESSINGS) ×1 IMPLANT
DRSG VERAFLO CLEANSE CC MED (GAUZE/BANDAGES/DRESSINGS) ×4
DURAPREP 26ML APPLICATOR (WOUND CARE) ×2 IMPLANT
ELECT REM PT RETURN 9FT ADLT (ELECTROSURGICAL) ×2
ELECTRODE REM PT RTRN 9FT ADLT (ELECTROSURGICAL) ×1 IMPLANT
GAUZE SPONGE 4X4 12PLY STRL (GAUZE/BANDAGES/DRESSINGS) ×1 IMPLANT
GLOVE SURG ORTHO LTX SZ9 (GLOVE) ×2 IMPLANT
GLOVE SURG UNDER POLY LF SZ9 (GLOVE) ×2 IMPLANT
GOWN STRL REUS W/ TWL XL LVL3 (GOWN DISPOSABLE) ×2 IMPLANT
GOWN STRL REUS W/TWL XL LVL3 (GOWN DISPOSABLE) ×4
GRAFT DERMACARRIERS 1 TO 1.5 (DISPOSABLE) IMPLANT
GRAFT TISS MESH 387 BURN (Graft) IMPLANT
HANDPIECE INTERPULSE COAX TIP (DISPOSABLE)
KIT BASIN OR (CUSTOM PROCEDURE TRAY) ×2 IMPLANT
KIT TURNOVER KIT B (KITS) ×2 IMPLANT
MANIFOLD NEPTUNE II (INSTRUMENTS) ×2 IMPLANT
MICROMATRIX 1000MG (Tissue) ×2 IMPLANT
NDL HYPO 25GX1X1/2 BEV (NEEDLE) IMPLANT
NEEDLE HYPO 25GX1X1/2 BEV (NEEDLE) IMPLANT
NS IRRIG 1000ML POUR BTL (IV SOLUTION) ×2 IMPLANT
PACK ORTHO EXTREMITY (CUSTOM PROCEDURE TRAY) ×2 IMPLANT
PAD ARMBOARD 7.5X6 YLW CONV (MISCELLANEOUS) ×4 IMPLANT
PAD CAST 4YDX4 CTTN HI CHSV (CAST SUPPLIES) IMPLANT
PADDING CAST COTTON 4X4 STRL (CAST SUPPLIES)
SET HNDPC FAN SPRY TIP SCT (DISPOSABLE) IMPLANT
SKIN MESHED 387 SQ CM (Graft) ×2 IMPLANT
SOLUTION PARTIC MCRMTRX 1000MG (Tissue) IMPLANT
STOCKINETTE IMPERVIOUS 9X36 MD (GAUZE/BANDAGES/DRESSINGS) IMPLANT
SUCTION FRAZIER HANDLE 10FR (MISCELLANEOUS)
SUCTION TUBE FRAZIER 10FR DISP (MISCELLANEOUS) IMPLANT
SUT ETHILON 2 0 PSLX (SUTURE) ×1 IMPLANT
SUT ETHILON 4 0 PS 2 18 (SUTURE) IMPLANT
SWAB COLLECTION DEVICE MRSA (MISCELLANEOUS) ×1 IMPLANT
SWAB CULTURE ESWAB REG 1ML (MISCELLANEOUS) IMPLANT
SYR CONTROL 10ML LL (SYRINGE) IMPLANT
TOWEL GREEN STERILE (TOWEL DISPOSABLE) ×2 IMPLANT
TOWEL GREEN STERILE FF (TOWEL DISPOSABLE) ×2 IMPLANT
TUBE CONNECTING 12X1/4 (SUCTIONS) ×2 IMPLANT
WATER STERILE IRR 1000ML POUR (IV SOLUTION) ×2 IMPLANT
YANKAUER SUCT BULB TIP NO VENT (SUCTIONS) ×2 IMPLANT

## 2021-07-10 NOTE — Progress Notes (Addendum)
[facility] PROGRESS NOTE  Joseph [name]. DGL:[id number] DOB: 10-Sep-1956 DOA: 06/11/2021 PCP: Donald Prose, MD  Status: Remains inpatient appropriate because: Continued need for treatment of left knee wound, continues to have significant difficulty mobilizing secondary to morbid obesity and knee pain/wound; electrolyte abnormalities secondary to recurrent AKI; external fixator in place to LLE Anticipated disposition: Nursing facility for rehab From: Home Medically stable for discharge: No Difficult to place: No Barriers to discharge: Continued need for surgical treatment-has multiple bed offers as of today  Level of care: Progressive   Code Status: Full Family Communication:  DVT prophylaxis: Coumadin/IV heparin currently on hold for surgical procedure COVID vaccination status: Unknown    HPI: 65 y.o. male with PMH significant for severe morbid obesity, OSA on CPAP, DM2, HTN, HLD, PAD, DVT on Coumadin, GERD, anxiety/depression, polyneuropathy, multiple falls who presented to the ED on 06/11/21 after fall at home resulting in left knee pain.  He was found to have left knee dislocation and fracture fragments with associated left knee effusion.  There were concern for possible left knee hematoma in setting of supratherapeutic INR.  Seen by orthopedic surgery, patient underwent closed reduction of left knee dislocation but no surgical management was planned because of patient's body habitus.  Patient is scheduled for a I&D on 07/08/2021 by Dr. Sharol Given.   Subjective: Patient has just returned from surgery and complaining of significant pain.  States has not been having much of an appetite secondary to pain.  Objective: Vitals:   07/10/21 0325 07/10/21 0735  BP: (!) 144/36 (!) 174/46  Pulse: 74 77  Resp: 16 16  Temp: 97.6 F (36.4 C) 98.4 F (36.9 C)  SpO2: 97% 94%    Intake/Output Summary (Last 24 hours) at 07/10/2021 5188 Last data filed at 07/10/2021 0500 Gross per 24 hour   Intake 621.37 ml  Output --  Net 621.37 ml   Filed Weights   06/25/21 0900 07/01/21 1518 07/08/21 0958  Weight: (!) 218 kg (!) 218 kg (!) 218 kg    Exam:  Constitutional: NAD, calm, uncomfortable secondary to sensation of twisted knee. Respiratory: clear to auscultation bilaterally, no wheezing, no crackles. Normal respiratory effort. No accessory muscle use.  BiPAP in place Cardiovascular: Regular rate and rhythm, no murmurs / rubs / gallops.  Appearing bilateral lower extremity edema. 2+ pedal pulses.  Abdomen: no tenderness, no masses palpated.  Bowel sounds positive. LBM 10/13 Musculoskeletal: External fixation device on left lower extremity, wound VAC in place prior surgical wound.  Neurologic: CN 2-12 grossly intact. Sensation intact, DTR normal. Strength 5/5 x all 4 extremities.  Psychiatric: Normal judgment and insight. Alert and oriented x 3. Normal mood.    Assessment/Plan: Acute problems: Left knee dislocation status post left knee reduction external fixation with evacuation hematoma  Left knee hematoma -Presented with fall.  X-ray showed left knee dislocation with several fracture fragments and hematoma.  Orthopedic team recommended conservative treatment with none weightbearing and immobilization given the fact surgical intervention not recommended due to patient's large body habitus -MRI obtained 9/30: significant ligamentous injury/lateral patellar dislocation.  Orthopedics team revisited the decision and made a plan of surgical fixation.  -10/5: left knee extensive debridement, reduction of knee dislocation, external fixation spanning of knee dislocation w/ wound VAC application. -Orthopedic surgery Dr. Marlou Sa recommended transfer to [facility] to be seen by Dr. Sharol Given for further evaluation and management.   -OR for I&D on Wednesday, 07/08/2021 and Friday, 07/10/21 with Dr. Sharol Given.    06/11/2021  06/18/2021    History of pulmonary embolism/chronic  anticoagulation with warfarin Coumadin on hold secondary to surgical procedures on 10/12 and 10/14. Restart Coumadin when no further procedures are planned/after approval from orthopedic postoperative   Recurrent AKI with recurrent hyperkalemia Baseline creatinine  1.2 w/ GFR >60. Over the past several days BUN, creatinine and slowly increasing; also persistent hyperglycemia in the 200s that is likely led to auto diuresis. Intake and output reviewed and patient is -2900 cc so suspect volume depletion 10/14 begin normal saline at 100 cc/h x 3 days and reevaluate Daily labs Previously received Lokelma for hyperkalemia   Type 2 diabetes mellitus Diabetic neuropathy -A1c 6.1 on 9/22 -Currently on Actos, Semglee 50 units nightly with NovoLog 3 units 3 times daily and sliding cell insulin.   Hold off home oral hypoglycemics.   Essential hypertension BP is currently at goal Continue on amlodipine and lisinopril.   Hyperlipidemia -Pravastatin on hold.   Chronic normocytic anemia Baseline hemoglobin ~10.   S/p of 2 units of PRBCs and 1 unit of FFP.   Preoperatively hemoglobin 7.6-follow CBC daily   Transient bradycardia  1st degree heart block Asymptomatic in context of known sleep apnea but occurs while awake.  EKG significant for 1st degree AV block which is chronic.  Telemetry with heart rate down to high 30s while awake Cardiology plans Zio patch at discharge.   Severe morbid obesity  -Body mass index is 63.41 kg/m. Patient has been advised to make an attempt to improve diet and exercise patterns to aid in weight loss. PT recommended Kreg bed.   GERD -Continue Protonix-if renal function does not rapidly improve will need to hold until renal function normal   OSA, compliant with CPAP Has been compliant with CPAP, encouraged to continue. -Continue BiPAP at HS as needed for naps   Constipation, opiate induced. -in setting of opiate use. -Continue Colace, Senokot,  Miralax -Dulcolax suppository prn   Pressure injury -Mid-nose, not present on admission. Secondary to CPAP mask   Generalized weakness PT OT assessments with orthopedic surgery's guidance. Appreciate PT OT's assistance Continue fall precautions      Data Reviewed: Basic Metabolic Panel: Recent Labs  Lab 07/06/21 0157 07/07/21 0744 07/08/21 0137 07/09/21 0135 07/10/21 0115  NA 133* 137 134* 135 135  K 5.0 4.9 5.2* 5.3* 5.7*  CL 104 106 107 108 106  CO2 22 24 23  21* 25  GLUCOSE 132* 107* 245* 221* 217*  BUN 67* 61* 57* 53* 57*  CREATININE 1.73* 1.35* 1.21 1.13 1.47*  CALCIUM 8.3* 8.8* 8.5* 8.7* 8.9  MG  --  2.4 2.3  --   --   PHOS  --  3.2 3.1  --   --    Liver Function Tests: Recent Labs  Lab 07/10/21 0115  AST 12*  ALT 5  ALKPHOS 78  BILITOT 0.6  PROT 5.2*  ALBUMIN 1.8*   No results for input(s): LIPASE, AMYLASE in the last 168 hours. No results for input(s): AMMONIA in the last 168 hours. CBC: Recent Labs  Lab 07/06/21 0157 07/07/21 0744 07/08/21 0137 07/09/21 0135 07/10/21 0115  WBC 5.6 5.3 4.8 5.2 6.9  NEUTROABS  --   --   --   --  4.3  HGB 8.2* 8.6* 8.2* 7.6* 7.6*  HCT 26.6* 28.9* 27.0* 26.3* 25.4*  MCV 93.7 95.4 94.7 97.4 97.3  PLT 260 263 250 253 267   CBG: Recent Labs  Lab 07/09/21 0744 07/09/21 1323 07/09/21 1531 07/09/21 2208  07/10/21 0734  GLUCAP 199* 237* 236* 222* 158*    Recent Results (from the past 240 hour(s))  Surgical pcr screen     Status: None   Collection Time: 07/01/21  6:22 PM   Specimen: Nasal Mucosa; Nasal Swab  Result Value Ref Range Status   MRSA, PCR NEGATIVE NEGATIVE Final   Staphylococcus aureus NEGATIVE NEGATIVE Final    Comment: (NOTE) The Xpert SA Assay (FDA approved for NASAL specimens in patients 74 years of age and older), is one component of a comprehensive surveillance program. It is not intended to diagnose infection nor to guide or monitor treatment. Performed at [facility], [place] [address] [place], [place] [address]   Aerobic/Anaerobic Culture w Gram Stain (surgical/deep wound)     Status: Abnormal   Collection Time: 07/01/21  7:39 PM   Specimen: Wound  Result Value Ref Range Status   Specimen Description   Final    WOUND KNEE LEFT Performed at [place] [address], [place], [place] [address]    Special Requests   Final    NONE Performed at [facility], [place] [address], [place], [place] [address]    Gram Stain   Final    NO ORGANISMS SEEN SQUAMOUS EPITHELIAL CELLS PRESENT MODERATE WBC PRESENT,BOTH PMN AND MONONUCLEAR MODERATE GRAM NEGATIVE RODS FEW GRAM POSITIVE COCCI    Culture (A)  Final    MULTIPLE ORGANISMS PRESENT, NONE PREDOMINANT NO STAPHYLOCOCCUS AUREUS ISOLATED NO GROUP A STREP (S.PYOGENES) ISOLATED NO ANAEROBES ISOLATED Performed at [facility], [place] [address], [place], [place] [address]    Report Status 07/06/2021 FINAL  Final     Scheduled Meds:  (feeding supplement) PROSource Plus  30 mL Oral BID BM   amLODipine  10 mg Oral Daily   cephALEXin  250 mg Oral QHS   docusate sodium  100 mg Oral BID   feeding supplement (GLUCERNA SHAKE)  237 mL Oral Q24H   FLUoxetine  40 mg Oral QPM   insulin aspart  0-20 Units Subcutaneous TID WC   insulin aspart  0-5 Units Subcutaneous QHS   insulin aspart  3 Units Subcutaneous TID WC   insulin glargine-yfgn  55 Units Subcutaneous QHS   methocarbamol  1,000 mg Oral BID   multivitamin with minerals  1 tablet Oral Daily   pantoprazole  40 mg Oral Daily   pioglitazone  45 mg Oral Daily   polyethylene glycol  17 g Oral BID   Ensure Max Protein  11 oz Oral Daily   senna  1 tablet Oral Daily   tamsulosin  0.4 mg Oral Daily   tranexamic acid (CYKLOKAPRON) topical - INTRAOP  2,000 mg Topical To OR   Continuous Infusions:  sodium chloride     methocarbamol (ROBAXIN) IV      Principal Problem:   Left knee dislocation, initial  encounter Active Problems:   Pure hypercholesterolemia   Essential hypertension, benign   OSA (obstructive sleep apnea)   Hx pulmonary embolism   Esophageal reflux   Diabetes mellitus type 2, uncontrolled   Diabetic neuropathy (HCC)   Acute renal failure superimposed on stage 3b chronic kidney disease (HCC)   Normocytic anemia   Class 3 obesity (HCC)   Aortic atherosclerosis (HCC)   Coronary atherosclerosis   Effusion of left knee   Left knee dislocation   Pressure injury of skin   Unstageable pressure ulcer of sacral region ([place])   At risk for hemorrhage  associated with anticoagulation therapy   Ulcer of left knee, with necrosis of bone (HCC)   Open knee wound, left, subsequent encounter   Consultants: Orthopedics Cardiology  Procedures: Echocardiogram Left knee reduction with external fixation and evacuation of hematoma Left thigh debridement on 10/12 and again on 10/14  Antibiotics:    Time spent: 35 minutes    Erin Hearing ANP  [facility] 7 am - 330 pm/M-F for direct patient care and secure chat Please refer to Amion for contact info 28  days

## 2021-07-10 NOTE — Anesthesia Procedure Notes (Signed)
Procedure Name: LMA Insertion Date/Time: 07/10/2021 10:09 AM Performed by: Georgia Duff, CRNA Pre-anesthesia Checklist: Patient identified, Emergency Drugs available, Suction available and Patient being monitored Patient Re-evaluated:Patient Re-evaluated prior to induction Oxygen Delivery Method: Circle System Utilized Preoxygenation: Pre-oxygenation with 100% oxygen Induction Type: IV induction Ventilation: Mask ventilation without difficulty LMA: LMA with gastric port inserted LMA Size: 5.0 Number of attempts: 1 Airway Equipment and Method: Bite block Placement Confirmation: positive ETCO2 Tube secured with: Tape Dental Injury: Teeth and Oropharynx as per pre-operative assessment

## 2021-07-10 NOTE — Transfer of Care (Signed)
Immediate Anesthesia Transfer of Care Note  Patient: Joseph [name].  Procedure(s) Performed: LEFT THIGH DEBRIDEMENT (Left) APPLY SKIN GRAFT (Left)  Patient Location: PACU  Anesthesia Type:General  Level of Consciousness: awake and alert   Airway & Oxygen Therapy: Patient Spontanous Breathing and Patient connected to face mask oxygen  Post-op Assessment: Report given to RN and Post -op Vital signs reviewed and stable  Post vital signs: Reviewed and stable  Last Vitals:  Vitals Value Taken Time  BP    Temp    Pulse    Resp    SpO2      Last Pain:  Vitals:   07/10/21 0851  TempSrc:   PainSc: 0-No pain      Patients Stated Pain Goal: 2 (21/94/71 2527)  Complications: No notable events documented.

## 2021-07-10 NOTE — Anesthesia Postprocedure Evaluation (Signed)
Anesthesia Post Note  Patient: Joseph [name].  Procedure(s) Performed: LEFT THIGH DEBRIDEMENT (Left) APPLY SKIN GRAFT (Left)     Patient location during evaluation: PACU Anesthesia Type: General Level of consciousness: awake and alert Pain management: pain level controlled Vital Signs Assessment: post-procedure vital signs reviewed and stable Respiratory status: spontaneous breathing, nonlabored ventilation and respiratory function stable Cardiovascular status: blood pressure returned to baseline and stable Postop Assessment: no apparent nausea or vomiting Anesthetic complications: no   No notable events documented.  Last Vitals:  Vitals:   07/10/21 1135 07/10/21 1218  BP: (!) 158/37 (!) 148/34  Pulse: 71 73  Resp: 15 17  Temp:  36.7 C  SpO2: 93% 91%    Last Pain:  Vitals:   07/10/21 1218  TempSrc: Oral  PainSc:                  Lidia Collum

## 2021-07-10 NOTE — Progress Notes (Signed)
ANTICOAGULATION CONSULT NOTE - Follow Up Consult  Pharmacy Consult for Heparin Indication: h/o pulmonary embolus  Allergies  Allergen Reactions   Tape     Peels skin on legs   Chlorhexidine Rash    Wipes    Patient Measurements: Height: 6\' 1"  (185.4 cm) Weight: (!) 218 kg (480 lb 9.6 oz) IBW/kg (Calculated) : 79.9 Heparin Dosing Weight: 132.5 kg  Vital Signs: Temp: 98.1 F (36.7 C) (10/14 1218) Temp Source: Oral (10/14 1218) BP: 148/34 (10/14 1218) Pulse Rate: 73 (10/14 1218)  Labs: Recent Labs    07/08/21 0137 07/08/21 0146 07/09/21 0135 07/10/21 0115  HGB 8.2*  --  7.6* 7.6*  HCT 27.0*  --  26.3* 25.4*  PLT 250  --  253 267  LABPROT 16.1*  --  15.8* 16.7*  INR 1.3*  --  1.3* 1.4*  HEPARINUNFRC  --  0.35 <0.10* <0.10*  CREATININE 1.21  --  1.13 1.47*     Estimated Creatinine Clearance: 97 mL/min (A) (by C-G formula based on SCr of 1.47 mg/dL (H)).   Medications:  PTA Warfarin:  5 mg on Sun/Mon/Wed/Fri evening, and 7.5 mg on Tue/Thur/Sat evening.   INR was supratherapeutic (6.2) at admission  Assessment: 68 YOM on lifelong warfarin therapy for hx of PE, body habitus, and sedentary lifestyle admitted on 9/15 with left knee dislocation and effusion concerning for hematoma. At admission, warfarin was held, vitamin K was given for supratherapeutic INR, and pt was transfused.   Heparin had been held post OR on 10/12 and resumed on 10/13. Held again for I&D on 10/14. Pharmacy consulted to resume IV heparin today. Discussed case with Ortho who is okay with resuming heparin post procedure.   Goal of Therapy:  Heparin level 0.3-0.7 units/ml Monitor platelets by anticoagulation protocol: Yes   Plan:  -Restart heparin at 2600 units/hr to start at 5 PM today  -F/u 6 hr anti-Xa level  -Monitor closely for s/s of bleeding -Monitor daily anti-Xa level and CBC  -F/u resuming warfarin   Albertina Parr, PharmD., BCPS, BCCCP Clinical Pharmacist Please refer to  [facility] for unit-specific pharmacist

## 2021-07-10 NOTE — Progress Notes (Signed)
  ANTICOAGULATION CONSULT NOTE - Follow Up Consult  Pharmacy Consult for Heparin Indication: h/o pulmonary embolus  Allergies  Allergen Reactions   Tape     Peels skin on legs   Chlorhexidine Rash    Wipes    Patient Measurements: Height: 6\' 1"  (185.4 cm) Weight: (!) 218 kg (480 lb 9.6 oz) IBW/kg (Calculated) : 79.9 Heparin Dosing Weight: 132.5 kg  Vital Signs: Temp: 97.6 F (36.4 C) (10/14 0022) Temp Source: Oral (10/14 0022) BP: 155/49 (10/14 0022) Pulse Rate: 77 (10/14 0022)  Labs: Recent Labs    07/08/21 0137 07/08/21 0146 07/09/21 0135 07/10/21 0115  HGB 8.2*  --  7.6* 7.6*  HCT 27.0*  --  26.3* 25.4*  PLT 250  --  253 267  LABPROT 16.1*  --  15.8* 16.7*  INR 1.3*  --  1.3* 1.4*  HEPARINUNFRC  --  0.35 <0.10* <0.10*  CREATININE 1.21  --  1.13 1.47*     Estimated Creatinine Clearance: 97 mL/min (A) (by C-G formula based on SCr of 1.47 mg/dL (H)).   Medications:  PTA Warfarin:  5 mg on Sun/Mon/Wed/Fri evening, and 7.5 mg on Tue/Thur/Sat evening.   INR was supratherapeutic (6.2) at admission  Assessment: 84 YOM on lifelong warfarin therapy for hx of PE, body habitus, and sedentary lifestyle admitted on 9/15 with left knee dislocation and effusion concerning for hematoma. At admission, warfarin was held, vitamin K was given for supratherapeutic INR, and pt was transfused.   Heparin had been held post OR on 10/12. Pharmacy consulted to resume heparin today and hold heparin at 0800 on 10/14 in anticipation of return to OR with ortho. Patient previously therapeutic on heparin 2600 units/hr. Per RN hematoma around knee appears stable. No other signs of bleeding.   Heparin level undetectable on gtt at 2350 units/hr. No issues with line or bleeding reported per RN.  Goal of Therapy:  Heparin level 0.3-0.7 units/ml Monitor platelets by anticoagulation protocol: Yes   Plan:  Increase heparin to 2600 units/hr. Plan to hold at 0800 on 10/14  F/u post [place], PharmD, BCPS Please see amion for complete clinical pharmacist phone list 07/10/2021 2:59 AM

## 2021-07-10 NOTE — Progress Notes (Signed)
OT Note  Pt in surgery today. Will plan to order Kreg Tilt bed for pt on Monday. Maurie Boettcher, OT/L   Acute OT Clinical Specialist Acute Rehabilitation Services Pager [phone] Office [phone]

## 2021-07-10 NOTE — Interval H&P Note (Signed)
History and Physical Interval Note:  07/10/2021 6:36 AM  [name].  has presented today for surgery, with the diagnosis of LEFT KNEE WOUND.  The various methods of treatment have been discussed with the patient and family. After consideration of risks, benefits and other options for treatment, the patient has consented to  Procedure(s): LEFT KNEE DEBRIDEMENT (Left) APPLY SKIN GRAFT (Left) as a surgical intervention.  The patient's history has been reviewed, patient examined, no change in status, stable for surgery.  I have reviewed the patient's chart and labs.  Questions were answered to the patient's satisfaction.     Newt Minion

## 2021-07-10 NOTE — Anesthesia Preprocedure Evaluation (Addendum)
Anesthesia Evaluation  Patient identified by MRN, date of birth, ID band Patient awake    Reviewed: Allergy & Precautions, NPO status , Patient's Chart, lab work & pertinent test results  History of Anesthesia Complications Negative for: history of anesthetic complications  Airway Mallampati: II  TM Distance: >3 FB Neck ROM: Full    Dental  (+) Chipped, Missing, Dental Advisory Given, Poor Dentition   Pulmonary sleep apnea and Continuous Positive Airway Pressure Ventilation , PE   Pulmonary exam normal        Cardiovascular hypertension, Pt. on medications (-) angina+ Peripheral Vascular Disease and + DVT  Normal cardiovascular exam  !0/03/2021 ECHO: EF 60-65%, normal LVF, no significant valvular abnormalities   Neuro/Psych Anxiety Depression negative neurological ROS     GI/Hepatic Neg liver ROS, GERD  Medicated and Controlled,  Endo/Other  diabetes, Insulin Dependent, Oral Hypoglycemic AgentsMorbid obesity  Renal/GU Renal InsufficiencyRenal disease (AKI, Cr 1.47)     Musculoskeletal   Abdominal (+) + obese,   Peds  Hematology  (+) Blood dyscrasia, anemia , Coumadin Hgb 7.6, INR 1.4   Anesthesia Other Findings   Reproductive/Obstetrics                             Anesthesia Physical  Anesthesia Plan  ASA: 4  Anesthesia Plan: General   Post-op Pain Management:    Induction: Intravenous  PONV Risk Score and Plan: 2 and Ondansetron and Dexamethasone  Airway Management Planned: Oral ETT and Video Laryngoscope Planned  Additional Equipment: None  Intra-op Plan:   Post-operative Plan: Extubation in OR  Informed Consent: I have reviewed the patients History and Physical, chart, labs and discussed the procedure including the risks, benefits and alternatives for the proposed anesthesia with the patient or authorized representative who has indicated his/her understanding and  acceptance.     Dental advisory given  Plan Discussed with:   Anesthesia Plan Comments:         Anesthesia Quick Evaluation

## 2021-07-10 NOTE — TOC Progression Note (Addendum)
Transition of Care (TOC) - Progression Note    Patient Details  Name: Joseph [name]. MRN: [id number] Date of Birth: 1956-09-11  Transition of Care [facility]) CM/SW Contact  Curlene Labrum, RN Phone Number: 07/10/2021, 10:31 AM  Clinical Narrative:    Patient is presently in the OR suite with Dr. Sharol Given this morning for surgery.  CM and MSW with DTP Team will continue to follow the patient for exploration of SNF/LTAC options for rehabilitation placement.  07/10/2021 1342 - Met with the patient at the bedside - I did not disturb the patient resting after surgery with CPAP mask on after surgery since he recently received pain medication and was sleeping.  I called and left a message on the wife's voicemail to introduce myself and my role to assist with transitions of care to home versus SNF for rehabilitation.  CM and MSW with DTP Team will continue to follow the patient for TOC needs.  07/10/2021 1430-  CM met with the patient's wife at the bedside to discuss transitions of care.  The patient's wife states that she would like the patient to go home with home health once the patient is medically / surgically clear for discharge at a later date.  The patient's wife states that prior to his Left knee hematoma and infection, the patient was ambulating at home and using CPAP at night without oxygen.  I explained to the patient's wife that the DTP Team would continue to follow the patient for discharge needs to home versus SNF/LTAC if needed.  The patient's wife was given a list of the accepting SNF facilities at this time as a second option to home considering the patient's care needs at this time.  [employer] had recently discussed with the wife that they were willing to assist the patient by reimbursing for home health needs to include 2 staff members to visit in the home due to patient's BMI.    CM and MSW will continue to follow the patient for discharge needs.  At this time, the patient  has medical needs including oxygen requirements with CPAP and oxygen, external fixator to left knee, wound vac, IV antibiotics, and extensive PT needs since the patient is currently non-weight bearing.  [facility] CM is following the patient at this time for screening and the wife is aware, just in case the patient is unable to safely return home due to care needs.  I called Arley Phenix, CM with [facility] and asked that she follow the patient if needed.  CM and MSW with DTP Team will continue to follow the patient for TOC needs.    Expected Discharge Plan: IP Rehab Facility Barriers to Discharge: Continued Medical Work up (Plan for return to OR with Dr. Sharol Given on 10/14- I&D)  Expected Discharge Plan and Services Expected Discharge Plan: [place] In-house Referral: Clinical Social Work Discharge Planning Services: CM Consult   Living arrangements for the past 2 months: Single Family Home                                       Social Determinants of Health (SDOH) Interventions    Readmission Risk Interventions Readmission Risk Prevention Plan 07/09/2021  Transportation Screening Complete  Medication Review [employer]) Complete  PCP or Specialist appointment within 3-5 days of discharge Complete  HRI or Home Care Consult Complete  SW Recovery  Care/Counseling Consult Complete  Skilled Nursing Facility Complete  Some recent data might be hidden

## 2021-07-10 NOTE — Op Note (Addendum)
07/10/2021  11:34 AM  PATIENT:  Joseph [name].    PRE-OPERATIVE DIAGNOSIS:  LEFT KNEE WOUND  POST-OPERATIVE DIAGNOSIS:  Same  PROCEDURE:  LEFT KNEE AND THIGH DEBRIDEMENT,  APPLY SKIN GRAFT, allograft split-thickness skin graft 387 cm. Application of ACell powder 1000 mg. Application of cleanse choice wound VAC sponges x2.  SURGEON:  Newt Minion, MD  PHYSICIAN ASSISTANT:None ANESTHESIA:   General  PREOPERATIVE INDICATIONS:  Joseph [name]. is a  65 y.o. male with a diagnosis of LEFT KNEE WOUND who failed conservative measures and elected for surgical management.    The risks benefits and alternatives were discussed with the patient preoperatively including but not limited to the risks of infection, bleeding, nerve injury, cardiopulmonary complications, the need for revision surgery, among others, and the patient was willing to proceed.  OPERATIVE IMPLANTS: Allograft split-thickness skin graft 387 cm.  @ENCIMAGES @  OPERATIVE FINDINGS: Operative margins were clear no signs of infection good granulation tissue.  OPERATIVE PROCEDURE: Patient was brought the operating room and underwent a general anesthetic.  After adequate levels anesthesia obtained patient's left lower extremity was prepped using DuraPrep draped into a sterile field a timeout was called.  A 21 blade knife was used to ellipse out the ischemic edges around the traumatic wound.  Skin and soft tissue and fascia was excised back to bleeding viable tissue this was irrigated with normal saline the ACell powder was applied to the wound followed by split-thickness skin graft, allograft, 387 cm.  This was then covered with 2 of the blue cleanse choice wound VAC sponges covered with derma tack Dermabond and Ioban.  This had a good suction fit patient was extubated taken the PACU in stable condition  Debridement type: Excisional Debridement  Side: left  Body Location: knee   Tools used for debridement: scalpel and  rongeur  Pre-debridement Wound size (cm):   Length: 15        Width: 15     Depth: 3   Post-debridement Wound size (cm):   Length: 20        Width: 20     Depth: 3   Debridement depth beyond dead/damaged tissue down to healthy viable tissue: yes  Tissue layer involved: skin, subcutaneous tissue, muscle / fascia  Nature of tissue removed: Non-viable tissue  Irrigation volume: 1 liter     Irrigation fluid type: Normal Saline     DISCHARGE PLANNING:  Antibiotic duration: Continue antibiotics for 5 days.  Weightbearing: Weightbearing as tolerated on the left.  Pain medication: Continue opioid pathway  Dressing care/ Wound VAC: Continue wound VAC for at least 1 week  Ambulatory devices: Walker  Discharge to: Possible discharge to home patient was a minimal ambulator at home patient may require discharge to skilled nursing.  Follow-up: In the office 1 week post operative.

## 2021-07-11 DIAGNOSIS — S83105A Unspecified dislocation of left knee, initial encounter: Secondary | ICD-10-CM | POA: Diagnosis not present

## 2021-07-11 LAB — CBC WITH DIFFERENTIAL/PLATELET
Abs Immature Granulocytes: 0.06 10*3/uL (ref 0.00–0.07)
Basophils Absolute: 0.1 10*3/uL (ref 0.0–0.1)
Basophils Relative: 1 %
Eosinophils Absolute: 0.3 10*3/uL (ref 0.0–0.5)
Eosinophils Relative: 5 %
HCT: 23.9 % — ABNORMAL LOW (ref 39.0–52.0)
Hemoglobin: 7.2 g/dL — ABNORMAL LOW (ref 13.0–17.0)
Immature Granulocytes: 1 %
Lymphocytes Relative: 22 %
Lymphs Abs: 1.5 10*3/uL (ref 0.7–4.0)
MCH: 28.8 pg (ref 26.0–34.0)
MCHC: 30.1 g/dL (ref 30.0–36.0)
MCV: 95.6 fL (ref 80.0–100.0)
Monocytes Absolute: 0.8 10*3/uL (ref 0.1–1.0)
Monocytes Relative: 11 %
Neutro Abs: 4.2 10*3/uL (ref 1.7–7.7)
Neutrophils Relative %: 60 %
Platelets: 295 10*3/uL (ref 150–400)
RBC: 2.5 MIL/uL — ABNORMAL LOW (ref 4.22–5.81)
RDW: 16.1 % — ABNORMAL HIGH (ref 11.5–15.5)
WBC: 6.9 10*3/uL (ref 4.0–10.5)
nRBC: 0.3 % — ABNORMAL HIGH (ref 0.0–0.2)

## 2021-07-11 LAB — BASIC METABOLIC PANEL
Anion gap: 6 (ref 5–15)
BUN: 61 mg/dL — ABNORMAL HIGH (ref 8–23)
CO2: 22 mmol/L (ref 22–32)
Calcium: 8.6 mg/dL — ABNORMAL LOW (ref 8.9–10.3)
Chloride: 105 mmol/L (ref 98–111)
Creatinine, Ser: 1.55 mg/dL — ABNORMAL HIGH (ref 0.61–1.24)
GFR, Estimated: 50 mL/min — ABNORMAL LOW (ref >60)
Glucose, Bld: 196 mg/dL — ABNORMAL HIGH (ref 70–99)
Potassium: 5.6 mmol/L — ABNORMAL HIGH (ref 3.5–5.1)
Sodium: 133 mmol/L — ABNORMAL LOW (ref 135–145)

## 2021-07-11 LAB — GLUCOSE, CAPILLARY
Glucose-Capillary: 209 mg/dL — ABNORMAL HIGH (ref 70–99)
Glucose-Capillary: 179 mg/dL — ABNORMAL HIGH (ref 70–99)
Glucose-Capillary: 186 mg/dL — ABNORMAL HIGH (ref 70–99)
Glucose-Capillary: 242 mg/dL — ABNORMAL HIGH (ref 70–99)

## 2021-07-11 LAB — HEPARIN LEVEL (UNFRACTIONATED)
Heparin Unfractionated: 0.28 IU/mL — ABNORMAL LOW (ref 0.30–0.70)
Heparin Unfractionated: 0.62 IU/mL (ref 0.30–0.70)

## 2021-07-11 MED ORDER — VITAMINS A & D EX OINT
TOPICAL_OINTMENT | CUTANEOUS | Status: DC | PRN
  Administered 2021-07-11: 1 via TOPICAL
  Filled 2021-07-11: qty 56.7

## 2021-07-11 NOTE — Progress Notes (Signed)
ANTICOAGULATION CONSULT NOTE - Follow Up Consult  Pharmacy Consult for Heparin Indication: h/o pulmonary embolus  Allergies  Allergen Reactions   Tape     Peels skin on legs   Chlorhexidine Rash    Wipes    Patient Measurements: Height: 6\' 1"  (185.4 cm) Weight: (!) 218 kg (480 lb 9.6 oz) IBW/kg (Calculated) : 79.9 Heparin Dosing Weight: 132.5 kg  Vital Signs: Temp: 98.7 F (37.1 C) (10/15 0747) Temp Source: Oral (10/15 0747) BP: 158/32 (10/15 0747) Pulse Rate: 79 (10/15 0747)  Labs: Recent Labs    07/09/21 0135 07/10/21 0115 07/11/21 0250 07/11/21 1031  HGB 7.6* 7.6* 7.2*  --   HCT 26.3* 25.4* 23.9*  --   PLT 253 267 295  --   LABPROT 15.8* 16.7*  --   --   INR 1.3* 1.4*  --   --   HEPARINUNFRC <0.10* <0.10* 0.28* 0.62  CREATININE 1.13 1.47* 1.55*  --      Estimated Creatinine Clearance: 92 mL/min (A) (by C-G formula based on SCr of 1.55 mg/dL (H)).   Medications:  PTA Warfarin:  5 mg on Sun/Mon/Wed/Fri evening, and 7.5 mg on Tue/Thur/Sat evening.   INR was supratherapeutic (6.2) at admission  Assessment: 89 YOM on lifelong warfarin therapy for hx of PE, body habitus, and sedentary lifestyle admitted on 9/15 with left knee dislocation and effusion concerning for hematoma. At admission, warfarin was held, vitamin K was given for supratherapeutic INR, and pt was transfused.   Heparin held post OR on 10/12 and resumed on 10/13. Held again for I&D on 10/14, resumed post-op with plans for no bolus x 24 hr from restart.  Heparin level therapeutic with significant increase in level to 0.62 after rate increase to 2800 units/hr. No bleeding or issues with infusion per discussion with RN.  Goal of Therapy:  Heparin level 0.3-0.7 units/ml Monitor platelets by anticoagulation protocol: Yes   Plan:  Reduce heparin slightly to 2750 units/hr to ensure stays in range Will f/u confirmatory heparin level with AM labs Monitor daily CBC, s/sx bleeding   Arturo Morton, PharmD, BCPS Please check AMION for all [place] contact numbers Clinical Pharmacist 07/11/2021 11:35 AM

## 2021-07-11 NOTE — Progress Notes (Addendum)
PROGRESS NOTE   Joseph [name].  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 Bedelia Person based course  Left knee dislocation + left knee reduction external fixation with hematoma evacuation 10/5 s/p knee debridement reduction of location external fixation with VAC 10/12 revision of I&D of thigh ulcer S/p further surgery under Dr. Sharol Given 10/14 Current pain meds include Oxy IR 5 every 4 as needed pain 1 to 410 mg every 4 as needed severe pain as well as Dilaudid every 3 as needed 7-10 pain May also use Robaxin as needed for pain Pain  mod controlled Pulmonary embolism on Coumadin at home Resume heparin drip and Coumadin as per orthopedics--likely  will need further procedures, so keep heparin gtt at this time  AKI on admit Peak creatinine 2.37, some worsening azotemia Continue saline 100 cc/h, periodic labs Hyperkalemia Possibly secondary to AKI with superimposed use of Ensure and oral supplementation Rx Lokelma 10 g Potassium remains low 5 range Do not replace magnesium unless if low Likely blood loss anaemia from surgery expected Monitor trends--transfusion threshold below 7 DM TY 2 with diabetic neuropathy CBGs ranging 196-209 sSI 55 units long-acting nightly 3 times daily coverage  Actos 45 mg Resume metformin when creatinine is at  baseline HTN Continue Norvasc 10 mg Home meds indapamide, Lasix, quinapril all on hold Bradycardia first-degree AV block Seen in setting of sleep apnea Supposed to get a Zio patch on discharge OSA compliant on CPAP Superobesity BMI 63 Mandatory CPAP when resting Get Kreg bed Has a area of breakdown over nasal bridge--place A & d oitment and bandage and manage as best possible  DVT prophylaxis: Resume heparin as per orthopedic Code status: Full code Family Communication: d/w daughter at bedside 10/14 Disposition:  Status is: Inpatient  Remains inpatient appropriate because:Hemodynamically unstable, Ongoing active pain requiring inpatient pain management, and Altered mental status  Dispo:  Patient From:  Home  Planned Disposition:  Inpatient Rehab  Medically stable for discharge:  No          Consultants:  Orthopedics  Procedures:   As above  Antimicrobials: Perioperative   Subjective:  Main c/o in pain in nasal bridge No cp fever LE pain remains controlled No fever Eating fair  Objective: Vitals:   07/10/21 2129 07/10/21 2255 07/11/21 0311 07/11/21 0747  BP: (!) 147/50 (!) 141/46 (!) 142/38 (!) 158/32  Pulse: 88 78 77 79  Resp: 17 17 19 20   Temp:  98.5 F (36.9 C) 98.5 F (36.9 C) 98.7 F (37.1 C)  TempSrc:  Oral Axillary Oral  SpO2: 99% 98% 98% 91%  Weight:      Height:        Intake/Output Summary (Last 24 hours) at 07/11/2021 1112 Last data filed at 07/11/2021 0300 Gross per 24 hour  Intake --  Output 1200 ml  Net -1200 ml    Filed Weights   06/25/21 0900 07/01/21 1518 07/08/21 0958  Weight: Marland Kitchen)  218 kg (!) 218 kg (!) 218 kg    Examination:  EOMI NCAT no focal deficit--nasal bridge appears to have some breakdown  CTA B no added sound no rales no rhonchi S1-S2 no M/R/G Abdomen obese non tender External fixator still in place on left lower extremity wound VAC in place neurologically grossly intact  Data Reviewed: personally  reviewed   CBC    Component Value Date/Time   WBC 6.9 07/11/2021 0250   RBC 2.50 (L) 07/11/2021 0250   HGB 7.2 (L) 07/11/2021 0250   HCT 23.9 (L) 07/11/2021 0250   PLT 295 07/11/2021 0250   MCV 95.6 07/11/2021 0250   MCH 28.8 07/11/2021 0250   MCHC 30.1 07/11/2021 0250   RDW 16.1 (H) 07/11/2021 0250   LYMPHSABS 1.5 07/11/2021 0250   MONOABS 0.8 07/11/2021 0250   EOSABS 0.3 07/11/2021 0250   BASOSABS 0.1 07/11/2021 0250   CMP Latest Ref Rng & Units 07/11/2021 07/10/2021 07/09/2021  Glucose 70 - 99 mg/dL 196(H) 217(H) 221(H)  BUN 8 - 23 mg/dL 61(H) 57(H) 53(H)  Creatinine 0.61 - 1.24 mg/dL 1.55(H) 1.47(H) 1.13  Sodium 135 - 145 mmol/L 133(L) 135 135  Potassium 3.5 - 5.1 mmol/L 5.6(H) 5.7(H) 5.3(H)  Chloride 98 - 111 mmol/L 105 106 108  CO2 22 - 32 mmol/L 22 25 21(L)  Calcium 8.9 - 10.3 mg/dL 8.6(L) 8.9 8.7(L)  Total Protein 6.5 - 8.1 g/dL - 5.2(L) -  Total Bilirubin 0.3 - 1.2 mg/dL - 0.6 -  Alkaline Phos 38 - 126 U/L - 78 -  AST 15 - 41 U/L - 12(L) -  ALT 0 - 44 U/L - 5 -     Radiology Studies: No results found.   Scheduled Meds:  (feeding supplement) PROSource Plus  30 mL Oral BID BM   amLODipine  10 mg Oral Daily   vitamin C  1,000 mg Oral Daily   [START ON 07/15/2021] cephALEXin  250 mg Oral QHS   docusate sodium  100 mg Oral Daily   feeding supplement (GLUCERNA SHAKE)  237 mL Oral Q24H   FLUoxetine  40 mg Oral QPM   insulin aspart  0-20 Units Subcutaneous TID WC   insulin aspart  0-5 Units Subcutaneous QHS   insulin aspart  3 Units Subcutaneous TID WC   insulin glargine-yfgn  55 Units Subcutaneous QHS   methocarbamol  1,000 mg Oral BID   multivitamin with minerals  1 tablet Oral Daily   nutrition supplement (JUVEN)  1 packet Oral BID BM   pantoprazole  40 mg Oral Daily   pioglitazone  45 mg Oral Daily   polyethylene glycol  17 g Oral BID   Ensure Max Protein  11 oz Oral Daily   senna  1 tablet Oral Daily   tamsulosin  0.4 mg Oral Daily   zinc sulfate   220 mg Oral Daily   Continuous Infusions:  sodium chloride     sodium chloride 75 mL/hr at 07/10/21 1827   sodium chloride 100 mL/hr at 07/10/21 1454    ceFAZolin (ANCEF) IV 2 g (07/11/21 0504)   heparin 2,800 Units/hr (07/11/21 0501)   lactated ringers 10 mL/hr at 07/10/21 0854   magnesium sulfate bolus IVPB     methocarbamol (ROBAXIN) IV       LOS: 29 days   Time spent: Fletcher, MD [facility] To contact the attending provider between 7A-7P or the covering provider during after hours 7P-7A, please log into the web site  [email or web address] and access using [facility] [place] password for that web site. If you do not have the password, please call the hospital operator.  07/11/2021, 11:12 AM

## 2021-07-11 NOTE — Progress Notes (Signed)
Inpatient Rehab Admissions Coordinator:   I spoke with pt.'s wife and OT who was in room completing session with Pt. And explained that at this time, Pt. Is not doing enough to be considered for CIR admit. He would need to be tolerating OOB for at least an hour at a time, standing/weight bearing, and attempting transfers. I agree that right now, SNF appears to be the most appropriate rehab venue for this pt. Due to limited tolerance. [facility] team will follow and monitor for tolerance and progress with therapies and consider admission if he becomes better able to tolerate therapy.   Clemens Catholic, [place], [place] Admissions Coordinator  [phone] ([place]) [phone] (office)

## 2021-07-11 NOTE — Progress Notes (Signed)
ANTICOAGULATION CONSULT NOTE - Follow Up Consult  Pharmacy Consult for Heparin Indication: h/o pulmonary embolus  Allergies  Allergen Reactions   Tape     Peels skin on legs   Chlorhexidine Rash    Wipes    Patient Measurements: Height: 6\' 1"  (185.4 cm) Weight: (!) 218 kg (480 lb 9.6 oz) IBW/kg (Calculated) : 79.9 Heparin Dosing Weight: 132.5 kg  Vital Signs: Temp: 98.5 F (36.9 C) (10/15 0311) Temp Source: Axillary (10/15 0311) BP: 142/38 (10/15 0311) Pulse Rate: 77 (10/15 0311)  Labs: Recent Labs    07/09/21 0135 07/10/21 0115 07/11/21 0250  HGB 7.6* 7.6* 7.2*  HCT 26.3* 25.4* 23.9*  PLT 253 267 295  LABPROT 15.8* 16.7*  --   INR 1.3* 1.4*  --   HEPARINUNFRC <0.10* <0.10* 0.28*  CREATININE 1.13 1.47* 1.55*     Estimated Creatinine Clearance: 92 mL/min (A) (by C-G formula based on SCr of 1.55 mg/dL (H)).   Medications:  PTA Warfarin:  5 mg on Sun/Mon/Wed/Fri evening, and 7.5 mg on Tue/Thur/Sat evening.   INR was supratherapeutic (6.2) at admission  Assessment: 55 YOM on lifelong warfarin therapy for hx of PE, body habitus, and sedentary lifestyle admitted on 9/15 with left knee dislocation and effusion concerning for hematoma. At admission, warfarin was held, vitamin K was given for supratherapeutic INR, and pt was transfused.   Heparin held post OR on 10/12 and resumed on 10/13. Held again for I&D on 10/14, resumed post-op.  Heparin level slightly subtherapeutic (0.28) on gtt at 2600 units/hr. No bleeding reported per RN. Pt lost line from 1830-2130 but running ok since then.  Goal of Therapy:  Heparin level 0.3-0.7 units/ml Monitor platelets by anticoagulation protocol: Yes   Plan:  Increase heparin to 2800 units/hr. (No bolus x 24 hr from restart post-op) Will f/u 6 hr heparin level  Sherlon Handing, PharmD, BCPS Please see amion for complete clinical pharmacist phone list 07/11/2021 4:51 AM

## 2021-07-11 NOTE — Plan of Care (Signed)
  Problem: Health Behavior/Discharge Planning: Goal: Ability to manage health-related needs will improve Outcome: Progressing   Problem: Clinical Measurements: Goal: Ability to maintain clinical measurements within normal limits will improve Outcome: Progressing   Problem: Nutrition: Goal: Adequate nutrition will be maintained Outcome: Progressing   Problem: Coping: Goal: Level of anxiety will decrease Outcome: Progressing   Problem: Pain Managment: Goal: General experience of comfort will improve Outcome: Progressing   Problem: Safety: Goal: Ability to remain free from injury will improve Outcome: Progressing   Problem: Skin Integrity: Goal: Risk for impaired skin integrity will decrease Outcome: Progressing   

## 2021-07-11 NOTE — Progress Notes (Signed)
Physical Therapy Treatment Patient Details Name: Joseph [name]. MRN: [id number] DOB: 04/17/1956 Today's Date: 07/11/2021   History of Present Illness Joseph [name] is a 65 yo male who presnets with increased LLE swelling after recent fall at home. 9/15 pt fell when getting off x-ray table at hospital. X-rays showed a knee dislocation which was confirmed on CT scan as a posterior lateral dislocation; L knee reduced and L KI placed. Lumbar and pelvic xrays negative. 10/5: left knee dislocation reduction with external fixation placement and extensive debridement of hematoma and devitalized skin subtenons tissue muscle fascia Placement of wound VAC; Wound debridement on 10/12. 10/14 L knee and thigh debridement with skin graft, now WBAT L LE.  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    PT Comments    Patient now WBAT on LLE with ex fix, however severely limited by pain, body habitus and weakness. Will benefit from tilt bed for progression. Patient requires maxA+2-3 for rolling L/R. Applied manual resistance to L foot with patient able to tolerate heavy resistance 4 times for 10 seconds each. Shifted hips to place sheet under pannus and pillow under to L hip to relieve pressure from pannus on ex fix. On CPAP throughout session. Continue to recommend SNF for ongoing Physical Therapy.      Recommendations for follow up therapy are one component of a multi-disciplinary discharge planning process, led by the attending physician.  Recommendations may be updated based on patient status, additional functional criteria and insurance authorization.  Follow Up Recommendations  SNF     Equipment Recommendations  Other (comment) (TBD)    Recommendations for Other Services       Precautions / Restrictions Precautions Precautions: Fall Precaution Comments: NO knee ROM on L-STRICT, ex fix Restrictions Weight Bearing Restrictions: Yes LLE Weight Bearing: Weight  bearing as tolerated Other Position/Activity Restrictions: clarified order with Dr. Sharol Given, WBAT in exfix to L LE     Mobility  Bed Mobility Overal bed mobility: Needs Assistance Bed Mobility: Rolling Rolling: Max assist;+2 for physical assistance;+2 for safety/equipment         General bed mobility comments: pt able to initate rolling and used rails to pull with UEs but overall requires max assist to roll in bed to L and R    Transfers                 General transfer comment: deferred due to pain and fatigue  Ambulation/Gait                 Stairs             Wheelchair Mobility    Modified Rankin (Stroke Patients Only)       Balance                                            Cognition Arousal/Alertness: Awake/alert Behavior During Therapy: WFL for tasks assessed/performed;Anxious Overall Cognitive Status: Within Functional Limits for tasks assessed                                        Exercises Other Exercises Other Exercises: ankle DF/PF, AAROM hip abduction/adduction    General Comments General comments (skin integrity, edema, etc.): cpap, RN present during session  to assist with toileting hygiene needs (pt unaware of bowel movement in bed), RN aware of upper pins irritation and drainage on sheets      Pertinent Vitals/Pain Pain Assessment: Faces Faces Pain Scale: Hurts whole lot Pain Location: L knee Pain Descriptors / Indicators: Grimacing;Aching;Sore;Discomfort;Guarding Pain Intervention(s): Monitored during session;Limited activity within patient's tolerance;Repositioned    Home Living                      Prior Function            PT Goals (current goals can now be found in the care plan section) Acute Rehab PT Goals Patient Stated Goal: to help move as much as possible PT Goal Formulation: With patient/family Time For Goal Achievement: 07/20/21 Potential to Achieve Goals:  Fair Progress towards PT goals: Progressing toward goals    Frequency    Min 3X/week      PT Plan Current plan remains appropriate    Co-evaluation PT/OT/SLP Co-Evaluation/Treatment: Yes Reason for Co-Treatment: Complexity of the patient's impairments (multi-system involvement);For patient/therapist safety;To address functional/ADL transfers PT goals addressed during session: Mobility/safety with mobility OT goals addressed during session: ADL's and self-care;Strengthening/ROM      AM-PAC PT "6 Clicks" Mobility   Outcome Measure  Help needed turning from your back to your side while in a flat bed without using bedrails?: Total Help needed moving from lying on your back to sitting on the side of a flat bed without using bedrails?: Total Help needed moving to and from a bed to a chair (including a wheelchair)?: Total Help needed standing up from a chair using your arms (e.g., wheelchair or bedside chair)?: Total Help needed to walk in hospital room?: Total Help needed climbing 3-5 steps with a railing? : Total 6 Click Score: 6    End of Session Equipment Utilized During Treatment: Oxygen Activity Tolerance: Patient tolerated treatment well;Patient limited by pain Patient left: in bed;with call bell/phone within reach Nurse Communication: Need for lift equipment PT Visit Diagnosis: Other abnormalities of gait and mobility (R26.89);Muscle weakness (generalized) (M62.81);History of falling (Z91.81);Repeated falls (R29.6);Pain Pain - Right/Left: Left Pain - part of body: Knee;Leg     Time: 0383-3383 PT Time Calculation (min) (ACUTE ONLY): 49 min  Charges:  $Therapeutic Exercise: 8-22 mins                     Elieser Tetrick A. Gilford Rile PT, DPT Acute Rehabilitation Services Pager [phone] Office [phone]    Linna Hoff 07/11/2021, 1:45 PM

## 2021-07-11 NOTE — Progress Notes (Signed)
Patient ID: Joseph Hedglin., male   DOB: 07-Jul-1956, 65 y.o.   MRN: [id number] Patient is a 65 year old gentleman who is postoperative day 1 split-thickness skin graft left knee ischemic wound.  There is 150 cc in the wound VAC canister.  Anticipate initiating therapy today weightbearing as tolerated left lower extremity.

## 2021-07-11 NOTE — Progress Notes (Signed)
Occupational Therapy Treatment Patient Details Name: Joseph [name]. MRN: [id number] DOB: 05/10/1956 Today's Date: 07/11/2021   History of present illness Joseph [name] is a 65 yo male who presnets with increased LLE swelling after recent fall at home. 9/15 pt fell when getting off x-ray table at hospital. X-rays showed a knee dislocation which was confirmed on CT scan as a posterior lateral dislocation; L knee reduced and L KI placed. Lumbar and pelvic xrays negative. 10/5: left knee dislocation reduction with external fixation placement and extensive debridement of hematoma and devitalized skin subtenons tissue muscle fascia Placement of wound VAC; Wound debridement on 10/12. 10/14 K knee and thigh debridement with skin graft, now WBAT L LE.  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   OT comments  Pt supine in bed, agreeable to OT/PT session.  Remains limited by pain, body habitus and weakness for OOB engagement.  He is now WBAT to L LE with ex fix in place, but pt continues to require max assist +2 to roll in bed and is unable to progress safely towards EOB today.  Continue to recommend KREG tilt bed to progress mobility. Requires total assist +2 for toileting hygiene (incontinent of bowels), and shifting hips to promote improved position of pannus and ex fix.  Placed sheet under pannus to wick away moisture. On CPAP during session.  Continue to recommend SNF at this time.    Recommendations for follow up therapy are one component of a multi-disciplinary discharge planning process, led by the attending physician.  Recommendations may be updated based on patient status, additional functional criteria and insurance authorization.    Follow Up Recommendations  SNF    Equipment Recommendations  3 in 1 bedside commode    Recommendations for Other Services      Precautions / Restrictions Precautions Precautions: Fall Precaution Comments: NO knee ROM on  L-STRICT, ex fix Restrictions Weight Bearing Restrictions: Yes LLE Weight Bearing: Weight bearing as tolerated Other Position/Activity Restrictions: clarified order with Dr. Sharol Given, WBAT in exfix to L LE       Mobility Bed Mobility Overal bed mobility: Needs Assistance Bed Mobility: Rolling Rolling: Max assist;+2 for physical assistance;+2 for safety/equipment         General bed mobility comments: pt able to initate rolling and used rails to pull with UEs but overall requires max assist to roll in bed to L and R    Transfers                 General transfer comment: deferred due to pain and fatigue    Balance                                           ADL either performed or assessed with clinical judgement   ADL Overall ADL's : Needs assistance/impaired     Grooming: Set up;Bed level               Lower Body Dressing: Total assistance;Bed level;+2 for physical assistance       Toileting- Clothing Manipulation and Hygiene: Total assistance;Bed level Toileting - Clothing Manipulation Details (indicate cue type and reason): + 3 assist     Functional mobility during ADLs: Maximal assistance;+2 for physical assistance;+2 for safety/equipment General ADL Comments: pt remains limited by L LE external fixator, body habitus and weakness  Vision       Perception     Praxis      Cognition Arousal/Alertness: Awake/alert Behavior During Therapy: WFL for tasks assessed/performed;Anxious Overall Cognitive Status: Within Functional Limits for tasks assessed                                          Exercises     Shoulder Instructions       General Comments cpap, RN present during session to assist with toileting hygiene needs (pt unaware of bowel movement in bed), RN aware of upper pins irritation and drainage on sheets    Pertinent Vitals/ Pain       Pain Assessment: Faces Faces Pain Scale: Hurts whole lot Pain  Location: L knee Pain Descriptors / Indicators: Grimacing;Aching;Sore;Discomfort;Guarding Pain Intervention(s): Limited activity within patient's tolerance;Monitored during session;Repositioned  Home Living                                          Prior Functioning/Environment              Frequency  Min 2X/week        Progress Toward Goals  OT Goals(current goals can now be found in the care plan section)  Progress towards OT goals: Progressing toward goals  Acute Rehab OT Goals Patient Stated Goal: to help move as much as possible OT Goal Formulation: With patient/family  Plan Discharge plan needs to be updated    Co-evaluation    PT/OT/SLP Co-Evaluation/Treatment: Yes Reason for Co-Treatment: Complexity of the patient's impairments (multi-system involvement);For patient/therapist safety;To address functional/ADL transfers   OT goals addressed during session: ADL's and self-care;Strengthening/ROM      AM-PAC OT "6 Clicks" Daily Activity     Outcome Measure   Help from another person eating meals?: None Help from another person taking care of personal grooming?: A Little Help from another person toileting, which includes using toliet, bedpan, or urinal?: Total Help from another person bathing (including washing, rinsing, drying)?: A Lot Help from another person to put on and taking off regular upper body clothing?: A Lot Help from another person to put on and taking off regular lower body clothing?: Total 6 Click Score: 13    End of Session    OT Visit Diagnosis: Unsteadiness on feet (R26.81);Other abnormalities of gait and mobility (R26.89);Muscle weakness (generalized) (M62.81);History of falling (Z91.81);Pain Pain - Right/Left: Left Pain - part of body: Leg   Activity Tolerance Patient limited by pain   Patient Left in bed;with call bell/phone within reach;with family/visitor present   Nurse Communication Mobility status         Time: 5701-7793 OT Time Calculation (min): 49 min  Charges: OT General Charges $OT Visit: 1 Visit OT Treatments $Self Care/Home Management : 8-22 mins $Therapeutic Activity: 8-22 mins  Jolaine Artist, OT Acute Rehabilitation Services Pager [phone] Office [phone]   Delight Stare 07/11/2021, 1:08 PM

## 2021-07-12 DIAGNOSIS — S83105A Unspecified dislocation of left knee, initial encounter: Secondary | ICD-10-CM | POA: Diagnosis not present

## 2021-07-12 LAB — BASIC METABOLIC PANEL
Anion gap: 5 (ref 5–15)
BUN: 73 mg/dL — ABNORMAL HIGH (ref 8–23)
CO2: 23 mmol/L (ref 22–32)
Calcium: 8.6 mg/dL — ABNORMAL LOW (ref 8.9–10.3)
Chloride: 106 mmol/L (ref 98–111)
Creatinine, Ser: 1.83 mg/dL — ABNORMAL HIGH (ref 0.61–1.24)
GFR, Estimated: 41 mL/min — ABNORMAL LOW (ref >60)
Glucose, Bld: 201 mg/dL — ABNORMAL HIGH (ref 70–99)
Potassium: 5.9 mmol/L — ABNORMAL HIGH (ref 3.5–5.1)
Sodium: 134 mmol/L — ABNORMAL LOW (ref 135–145)

## 2021-07-12 LAB — CBC WITH DIFFERENTIAL/PLATELET
Abs Immature Granulocytes: 0.08 10*3/uL — ABNORMAL HIGH (ref 0.00–0.07)
Basophils Absolute: 0.1 10*3/uL (ref 0.0–0.1)
Basophils Relative: 1 %
Eosinophils Absolute: 0.3 10*3/uL (ref 0.0–0.5)
Eosinophils Relative: 5 %
HCT: 23.8 % — ABNORMAL LOW (ref 39.0–52.0)
Hemoglobin: 7 g/dL — ABNORMAL LOW (ref 13.0–17.0)
Immature Granulocytes: 1 %
Lymphocytes Relative: 24 %
Lymphs Abs: 1.8 10*3/uL (ref 0.7–4.0)
MCH: 28.1 pg (ref 26.0–34.0)
MCHC: 29.4 g/dL — ABNORMAL LOW (ref 30.0–36.0)
MCV: 95.6 fL (ref 80.0–100.0)
Monocytes Absolute: 0.7 10*3/uL (ref 0.1–1.0)
Monocytes Relative: 10 %
Neutro Abs: 4.3 10*3/uL (ref 1.7–7.7)
Neutrophils Relative %: 59 %
Platelets: 315 10*3/uL (ref 150–400)
RBC: 2.49 MIL/uL — ABNORMAL LOW (ref 4.22–5.81)
RDW: 16.5 % — ABNORMAL HIGH (ref 11.5–15.5)
WBC: 7.3 10*3/uL (ref 4.0–10.5)
nRBC: 0.4 % — ABNORMAL HIGH (ref 0.0–0.2)

## 2021-07-12 LAB — GLUCOSE, CAPILLARY
Glucose-Capillary: 163 mg/dL — ABNORMAL HIGH (ref 70–99)
Glucose-Capillary: 161 mg/dL — ABNORMAL HIGH (ref 70–99)
Glucose-Capillary: 113 mg/dL — ABNORMAL HIGH (ref 70–99)
Glucose-Capillary: 117 mg/dL — ABNORMAL HIGH (ref 70–99)

## 2021-07-12 LAB — HEPARIN LEVEL (UNFRACTIONATED)
Heparin Unfractionated: 0.18 IU/mL — ABNORMAL LOW (ref 0.30–0.70)
Heparin Unfractionated: 0.38 IU/mL (ref 0.30–0.70)

## 2021-07-12 LAB — PREPARE RBC (CROSSMATCH)

## 2021-07-12 MED ORDER — FUROSEMIDE 10 MG/ML IJ SOLN
20.0000 mg | Freq: Once | INTRAMUSCULAR | Status: AC
  Administered 2021-07-12: 20 mg via INTRAVENOUS
  Filled 2021-07-12: qty 2

## 2021-07-12 MED ORDER — ACETAMINOPHEN 325 MG PO TABS
650.0000 mg | ORAL_TABLET | Freq: Once | ORAL | Status: AC
  Administered 2021-07-12: 650 mg via ORAL
  Filled 2021-07-12: qty 2

## 2021-07-12 MED ORDER — SODIUM ZIRCONIUM CYCLOSILICATE 5 G PO PACK
5.0000 g | PACK | Freq: Two times a day (BID) | ORAL | Status: AC
  Administered 2021-07-12 (×2): 5 g via ORAL
  Filled 2021-07-12 (×2): qty 1

## 2021-07-12 MED ORDER — DIPHENHYDRAMINE HCL 25 MG PO CAPS
25.0000 mg | ORAL_CAPSULE | Freq: Once | ORAL | Status: AC
  Administered 2021-07-12: 25 mg via ORAL
  Filled 2021-07-12: qty 1

## 2021-07-12 MED ORDER — SODIUM CHLORIDE 0.9% IV SOLUTION
Freq: Once | INTRAVENOUS | Status: AC
  Administered 2021-07-12: 100 mL via INTRAVENOUS

## 2021-07-12 NOTE — Progress Notes (Signed)
PROGRESS NOTE   Joseph [name].  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 Bedelia Person based course  Left knee dislocation + left knee reduction external fixation with hematoma evacuation 10/5 s/p knee debridement reduction of location external fixation with VAC 10/12 revision of I&D of thigh ulcer S/p further surgery under Dr. Sharol Given 10/14 Current pain meds include Oxy IR 5 every 4 as needed pain 1 to 410 mg every 4 as needed severe pain as well as Dilaudid every 3 as needed 7-10 pain May also use Robaxin as needed for pain Pain  mod controlled Patient has an area of the wound where the external fixator was placed that has been oozing some-this should be reviewed by orthopedics when they review again AKI on admit Peak creatinine 2.37, some worsening azotemia Continue saline 100 cc/h, periodic labs ? related to blood loss anemia with poor prep poor perfusion-transfusing as below Recheck labs daily Pulmonary embolism on Coumadin at home Resume heparin drip and Coumadin as per orthopedics--likely  will need further procedures, so keep heparin gtt at this time  Hyperkalemia Possibly secondary to AKI with superimposed use of Ensure and oral supplementation Start Lokelma scheduled 10 twice daily today and consider reducing in a.m. Potassium  remains low 5 range Do not replace magnesium unless if low Likely blood loss anaemia from surgery expected Monitor trends--giving 1 unit of packed red blood cells 10/16 additionally Check labs in the morning DM TY 2 with diabetic neuropathy CBGs ranging 1 13-1 60 sSI 55 units long-acting nightly 3 times daily coverage  Actos 45 mg Resume metformin when creatinine is at baseline HTN Continue Norvasc 10 mg Home meds indapamide, Lasix, quinapril all on hold Bradycardia first-degree AV block Seen in setting of sleep apnea Supposed to get a Zio patch on discharge OSA compliant on CPAP Superobesity BMI 63 Mandatory CPAP when resting Get Kreg bed Has a area of breakdown over nasal bridge--place A & d oitment and bandage and manage as best possible  DVT prophylaxis: Resume heparin as per orthopedic Code status: Full code Family Communication: d/w daughter at bedside 10/14 and 10/50 overall fair some Disposition:  Status is: Inpatient  Remains inpatient appropriate because:Hemodynamically unstable, Ongoing active pain requiring inpatient pain management, and Altered mental status  Dispo:  Patient From:  Home  Planned Disposition:  Inpatient Rehab  Medically stable for discharge:  No          Consultants:  Orthopedics  Procedures:   As above  Antimicrobials: Perioperative   Subjective:  Oozing from one of the fixators no chest pain no fever No nausea no vomiting States his nose feels about the same no new changes today   Objective: Vitals:   07/12/21 0805 07/12/21 1109 07/12/21 1131 07/12/21 1430  BP: (!) 148/43 (!) 134/42 (!) 132/47 (!) 146/47  Pulse: 87 79 78 71  Resp: 20 16 16 13   Temp: 98.7 F (37.1 C) 98 F (36.7  C) 97.9 F (36.6 C) 97.7 F (36.5 C)  TempSrc: Oral Oral Oral Oral  SpO2: 96% 94% 95% 93%  Weight:      Height:        Intake/Output Summary (Last 24 hours) at 07/12/2021 1748 Last data filed at 07/12/2021 1430 Gross per 24 hour   Intake 4694.94 ml  Output 975 ml  Net 3719.94 ml    Filed Weights   06/25/21 0900 07/01/21 1518 07/08/21 0958  Weight: (!) 218 kg (!) 218 kg (!) 218 kg    Examination:  EOMI NCAT no focal deficit--nasal bridge appears to have some breakdown but is covered with reinforce dressing  CTA B no added sound no wheeze S1-S2 no M/R/G Abdomen obese and hard to appreciate organomegaly External fixator still in place on left lower extremity --gaping area on upper fixator on the side that he lies on  wound VAC in place neurologically grossly intact  Data Reviewed: personally reviewed   CBC    Component Value Date/Time   WBC 7.3 07/12/2021 0245   RBC 2.49 (L) 07/12/2021 0245   HGB 7.0 (L) 07/12/2021 0245   HCT 23.8 (L) 07/12/2021 0245   PLT 315 07/12/2021 0245   MCV 95.6 07/12/2021 0245   MCH 28.1 07/12/2021 0245   MCHC 29.4 (L) 07/12/2021 0245   RDW 16.5 (H) 07/12/2021 0245   LYMPHSABS 1.8 07/12/2021 0245   MONOABS 0.7 07/12/2021 0245   EOSABS 0.3 07/12/2021 0245   BASOSABS 0.1 07/12/2021 0245   CMP Latest Ref Rng & Units 07/12/2021 07/11/2021 07/10/2021  Glucose 70 - 99 mg/dL 201(H) 196(H) 217(H)  BUN 8 - 23 mg/dL 73(H) 61(H) 57(H)  Creatinine 0.61 - 1.24 mg/dL 1.83(H) 1.55(H) 1.47(H)  Sodium 135 - 145 mmol/L 134(L) 133(L) 135  Potassium 3.5 - 5.1 mmol/L 5.9(H) 5.6(H) 5.7(H)  Chloride 98 - 111 mmol/L 106 105 106  CO2 22 - 32 mmol/L 23 22 25   Calcium 8.9 - 10.3 mg/dL 8.6(L) 8.6(L) 8.9  Total Protein 6.5 - 8.1 g/dL - - 5.2(L)  Total Bilirubin 0.3 - 1.2 mg/dL - - 0.6  Alkaline Phos 38 - 126 U/L - - 78  AST 15 - 41 U/L - - 12(L)  ALT 0 - 44 U/L - - 5     Radiology Studies: No results found.   Scheduled Meds:  (feeding supplement) PROSource Plus  30 mL Oral BID BM   amLODipine  10 mg Oral Daily   vitamin C  1,000 mg Oral Daily   [START ON 07/15/2021] cephALEXin  250 mg Oral QHS   docusate sodium  100 mg Oral Daily   feeding supplement (GLUCERNA SHAKE)  237 mL Oral  Q24H   FLUoxetine  40 mg Oral QPM   insulin aspart  0-20 Units Subcutaneous TID WC   insulin aspart  0-5 Units Subcutaneous QHS   insulin aspart  3 Units Subcutaneous TID WC   insulin glargine-yfgn  55 Units Subcutaneous QHS   methocarbamol  1,000 mg Oral BID   multivitamin with minerals  1 tablet Oral Daily   nutrition supplement (JUVEN)  1 packet Oral BID BM   pantoprazole  40 mg Oral Daily   pioglitazone  45 mg Oral Daily   polyethylene glycol  17 g Oral BID   Ensure Max Protein  11 oz Oral Daily   senna  1 tablet Oral Daily   sodium zirconium cyclosilicate  5 g Oral BID   tamsulosin  0.4 mg Oral Daily   zinc  sulfate  220 mg Oral Daily   Continuous Infusions:  sodium chloride 75 mL/hr at 07/12/21 1501    ceFAZolin (ANCEF) IV 2 g (07/12/21 1458)   heparin 2,900 Units/hr (07/12/21 0855)   magnesium sulfate bolus IVPB     methocarbamol (ROBAXIN) IV       LOS: 30 days   Time spent: 82  Nita Sells, MD [facility] To contact the attending provider between 7A-7P or the covering provider during after hours 7P-7A, please log into the web site [email or web address] and access using universal [place] password for that web site. If you do not have the password, please call the hospital operator.  07/12/2021, 5:48 PM

## 2021-07-12 NOTE — Progress Notes (Signed)
ANTICOAGULATION CONSULT NOTE - Follow Up Consult  Pharmacy Consult for Heparin Indication: h/o pulmonary embolus  Allergies  Allergen Reactions   Tape     Peels skin on legs   Chlorhexidine Rash    Wipes    Patient Measurements: Height: 6\' 1"  (185.4 cm) Weight: (!) 218 kg (480 lb 9.6 oz) IBW/kg (Calculated) : 79.9 Heparin Dosing Weight: 132.5 kg  Vital Signs: Temp: 97.9 F (36.6 C) (10/16 1131) Temp Source: Oral (10/16 1131) BP: 132/47 (10/16 1131) Pulse Rate: 78 (10/16 1131)  Labs: Recent Labs    07/10/21 0115 07/11/21 0250 07/11/21 1031 07/12/21 0245 07/12/21 0954  HGB 7.6* 7.2*  --  7.0*  --   HCT 25.4* 23.9*  --  23.8*  --   PLT 267 295  --  315  --   LABPROT 16.7*  --   --   --   --   INR 1.4*  --   --   --   --   HEPARINUNFRC <0.10* 0.28* 0.62 0.18* 0.38  CREATININE 1.47* 1.55*  --  1.83*  --      Estimated Creatinine Clearance: 77.9 mL/min (A) (by C-G formula based on SCr of 1.83 mg/dL (H)).   Medications:  PTA Warfarin:  5 mg on Sun/Mon/Wed/Fri evening, and 7.5 mg on Tue/Thur/Sat evening.   INR was supratherapeutic (6.2) at admission  Assessment: 46 YOM on lifelong warfarin therapy for hx of PE, body habitus, and sedentary lifestyle admitted on 9/15 with left knee dislocation and effusion concerning for hematoma. At admission, warfarin was held, vitamin K was given for supratherapeutic INR, and pt was transfused.   Heparin level now therapeutic at 0.38 on gtt at 2900 units/hr. Hgb down to 7 - transfusing, plt stable. No bleeding issues reported.   Goal of Therapy:  Heparin level 0.3-0.7 units/ml Monitor platelets by anticoagulation protocol: Yes   Plan:  Continue heparin at 2900 units/hr F/u confirmatory heparin level with AM labs Monitor daily CBC, s/sx bleeding   Arturo Morton, PharmD, BCPS Please check AMION for all [place] contact numbers Clinical Pharmacist 07/12/2021 2:24 PM

## 2021-07-12 NOTE — Plan of Care (Signed)
  Problem: Health Behavior/Discharge Planning: Goal: Ability to manage health-related needs will improve 07/12/2021 1922 by Emmaline Life, RN Outcome: Progressing 07/12/2021 1922 by Emmaline Life, RN Outcome: Progressing

## 2021-07-12 NOTE — Progress Notes (Signed)
ANTICOAGULATION CONSULT NOTE - Follow Up Consult  Pharmacy Consult for Heparin Indication: h/o pulmonary embolus  Allergies  Allergen Reactions   Tape     Peels skin on legs   Chlorhexidine Rash    Wipes    Patient Measurements: Height: 6\' 1"  (185.4 cm) Weight: (!) 218 kg (480 lb 9.6 oz) IBW/kg (Calculated) : 79.9 Heparin Dosing Weight: 132.5 kg  Vital Signs: Temp: 98.9 F (37.2 C) (10/15 2308) Temp Source: Axillary (10/15 2308) BP: 148/38 (10/15 2308) Pulse Rate: 81 (10/15 2308)  Labs: Recent Labs    07/10/21 0115 07/11/21 0250 07/11/21 1031 07/12/21 0245  HGB 7.6* 7.2*  --  7.0*  HCT 25.4* 23.9*  --  23.8*  PLT 267 295  --  315  LABPROT 16.7*  --   --   --   INR 1.4*  --   --   --   HEPARINUNFRC <0.10* 0.28* 0.62 0.18*  CREATININE 1.47* 1.55*  --  1.83*     Estimated Creatinine Clearance: 77.9 mL/min (A) (by C-G formula based on SCr of 1.83 mg/dL (H)).   Medications:  PTA Warfarin:  5 mg on Sun/Mon/Wed/Fri evening, and 7.5 mg on Tue/Thur/Sat evening.   INR was supratherapeutic (6.2) at admission  Assessment: 78 YOM on lifelong warfarin therapy for hx of PE, body habitus, and sedentary lifestyle admitted on 9/15 with left knee dislocation and effusion concerning for hematoma. At admission, warfarin was held, vitamin K was given for supratherapeutic INR, and pt was transfused.   Heparin level down to 0.18 (subtherapeutic) on gtt at 2750 units/hr. No issues with line or bleeding reported per RN. Hgb down to 7.   Goal of Therapy:  Heparin level 0.3-0.7 units/ml Monitor platelets by anticoagulation protocol: Yes   Plan:  Increase heparin to 2900 F/u 6 hr heparin level  Sherlon Handing, PharmD, BCPS Please see amion for complete clinical pharmacist phone list 07/12/2021 3:47 AM

## 2021-07-12 NOTE — Plan of Care (Signed)
  Problem: Health Behavior/Discharge Planning: Goal: Ability to manage health-related needs will improve Outcome: Progressing   

## 2021-07-13 ENCOUNTER — Encounter (HOSPITAL_COMMUNITY): Payer: Self-pay | Admitting: Orthopedic Surgery

## 2021-07-13 DIAGNOSIS — S83105A Unspecified dislocation of left knee, initial encounter: Secondary | ICD-10-CM | POA: Diagnosis not present

## 2021-07-13 LAB — TYPE AND SCREEN
ABO/RH(D): A POS
Antibody Screen: NEGATIVE
Unit division: 0

## 2021-07-13 LAB — CK: Total CK: 17 U/L — ABNORMAL LOW (ref 49–397)

## 2021-07-13 LAB — COMPREHENSIVE METABOLIC PANEL
ALT: <5 U/L (ref 0–44)
AST: 11 U/L — ABNORMAL LOW (ref 15–41)
Albumin: 1.9 g/dL — ABNORMAL LOW (ref 3.5–5.0)
Alkaline Phosphatase: 73 U/L (ref 38–126)
Anion gap: 7 (ref 5–15)
BUN: 77 mg/dL — ABNORMAL HIGH (ref 8–23)
CO2: 20 mmol/L — ABNORMAL LOW (ref 22–32)
Calcium: 8.6 mg/dL — ABNORMAL LOW (ref 8.9–10.3)
Chloride: 107 mmol/L (ref 98–111)
Creatinine, Ser: 1.59 mg/dL — ABNORMAL HIGH (ref 0.61–1.24)
GFR, Estimated: 48 mL/min — ABNORMAL LOW (ref >60)
Glucose, Bld: 135 mg/dL — ABNORMAL HIGH (ref 70–99)
Potassium: 5.6 mmol/L — ABNORMAL HIGH (ref 3.5–5.1)
Sodium: 134 mmol/L — ABNORMAL LOW (ref 135–145)
Total Bilirubin: 0.6 mg/dL (ref 0.3–1.2)
Total Protein: 5.4 g/dL — ABNORMAL LOW (ref 6.5–8.1)

## 2021-07-13 LAB — CBC WITH DIFFERENTIAL/PLATELET
Abs Immature Granulocytes: 0.07 10*3/uL (ref 0.00–0.07)
Basophils Absolute: 0.1 10*3/uL (ref 0.0–0.1)
Basophils Relative: 1 %
Eosinophils Absolute: 0.4 10*3/uL (ref 0.0–0.5)
Eosinophils Relative: 6 %
HCT: 27.5 % — ABNORMAL LOW (ref 39.0–52.0)
Hemoglobin: 8 g/dL — ABNORMAL LOW (ref 13.0–17.0)
Immature Granulocytes: 1 %
Lymphocytes Relative: 23 %
Lymphs Abs: 1.5 10*3/uL (ref 0.7–4.0)
MCH: 27.8 pg (ref 26.0–34.0)
MCHC: 29.1 g/dL — ABNORMAL LOW (ref 30.0–36.0)
MCV: 95.5 fL (ref 80.0–100.0)
Monocytes Absolute: 0.6 10*3/uL (ref 0.1–1.0)
Monocytes Relative: 10 %
Neutro Abs: 3.8 10*3/uL (ref 1.7–7.7)
Neutrophils Relative %: 59 %
Platelets: 317 10*3/uL (ref 150–400)
RBC: 2.88 MIL/uL — ABNORMAL LOW (ref 4.22–5.81)
RDW: 17 % — ABNORMAL HIGH (ref 11.5–15.5)
WBC: 6.5 10*3/uL (ref 4.0–10.5)
nRBC: 0.5 % — ABNORMAL HIGH (ref 0.0–0.2)

## 2021-07-13 LAB — CORTISOL: Cortisol, Plasma: 16.2 ug/dL

## 2021-07-13 LAB — GLUCOSE, CAPILLARY
Glucose-Capillary: 128 mg/dL — ABNORMAL HIGH (ref 70–99)
Glucose-Capillary: 114 mg/dL — ABNORMAL HIGH (ref 70–99)
Glucose-Capillary: 128 mg/dL — ABNORMAL HIGH (ref 70–99)
Glucose-Capillary: 180 mg/dL — ABNORMAL HIGH (ref 70–99)

## 2021-07-13 LAB — BPAM RBC
Blood Product Expiration Date: 202211022359
ISSUE DATE / TIME: 202210161050
Unit Type and Rh: 6200

## 2021-07-13 LAB — HEPARIN LEVEL (UNFRACTIONATED): Heparin Unfractionated: 0.4 IU/mL (ref 0.30–0.70)

## 2021-07-13 MED ORDER — WARFARIN SODIUM 5 MG PO TABS
10.0000 mg | ORAL_TABLET | Freq: Once | ORAL | Status: AC
  Administered 2021-07-13: 10 mg via ORAL
  Filled 2021-07-13: qty 2

## 2021-07-13 MED ORDER — WARFARIN - PHARMACIST DOSING INPATIENT: Freq: Every day | Status: DC

## 2021-07-13 NOTE — Progress Notes (Signed)
Physical Therapy Treatment Patient Details Name: Joseph [name]. MRN: [id number] DOB: 1956-02-11 Today's Date: 07/13/2021   History of Present Illness Joseph [name] is a 65 yo male who presnets with increased LLE swelling after recent fall at home. 06/11/21 pt fell when getting off x-ray table at hospital. X-rays showed a knee dislocation which was confirmed on CT scan as a posterior lateral dislocation; L knee reduced and L KI placed. Lumbar and pelvic xrays negative. 10/5: left knee dislocation reduction with external fixation placement and extensive debridement of hematoma and devitalized skin subtenons tissue muscle fascia Placement of wound VAC; Wound debridement on 10/12. 10/14 L knee and thigh debridement with skin graft, now WBAT L LE.  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    PT Comments    Goal of session was get pt moved to bariatric tilt bed from his sizewise bari bed to be able to start tilting this week.  Spoke with OT who is aware he is now on the bed.  Kreg Bed rep, Octavia Bruckner is also aware and going to stop by on Wed.  Pt continues to be very anxious, needed frequent rest breaks throughout bed level mobility to catch his breath.  Despite 3/4 DOE O2 sats tended to stay >94% on CPAP.  PT will continue to follow acutely for safe mobility progression.   Recommendations for follow up therapy are one component of a multi-disciplinary discharge planning process, led by the attending physician.  Recommendations may be updated based on patient status, additional functional criteria and insurance authorization.  Follow Up Recommendations  SNF     Equipment Recommendations  Hospital bed;Other (comment) (>500 lb capacity lift equipment, bariatric bed, bariatric WC >22x22" (may need 24" or larger))    Recommendations for Other Services       Precautions / Restrictions Precautions Precautions: Fall Precaution Comments: external fixation (pad the  top near pannus) Restrictions LLE Weight Bearing: Weight bearing as tolerated     Mobility  Bed Mobility Overal bed mobility: Needs Assistance Bed Mobility: Rolling Rolling: Max assist;+2 for physical assistance (max assist +3 or more)         General bed mobility comments: Multiple rolls for pericare, maxi slide placement and bed change.    Transfers Overall transfer level: Needs assistance Equipment used:  (maxi slide)             General transfer comment: Maxi slide used to move pt from one bed to Kreg tilt bed.  6 people used for lateral slide transfer in supine.  Ambulation/Gait                 Stairs             Wheelchair Mobility    Modified Rankin (Stroke Patients Only)       Balance                                            Cognition Arousal/Alertness: Awake/alert Behavior During Therapy: Anxious Overall Cognitive Status: Within Functional Limits for tasks assessed                                 General Comments: wife was present during session and he does better when she is there with him.  Exercises      General Comments General comments (skin integrity, edema, etc.): Did not tilt today as significant amount of time spent doing peri care, letting pt recover his breathing and then getting his bed changed and getting him comfortable.  Despite 3/4 DOE at thimes on CPAP, pt's O2 sats remained 94%  (except when rolling, they did drop when rolling to low 80s)      Pertinent Vitals/Pain Pain Assessment: Faces Faces Pain Scale: Hurts whole lot Pain Location: L knee Pain Descriptors / Indicators: Grimacing;Aching;Sore;Discomfort;Guarding Pain Intervention(s): Limited activity within patient's tolerance;Monitored during session;Repositioned;RN gave pain meds during session    Home Living                      Prior Function            PT Goals (current goals can now be found in the  care plan section) Acute Rehab PT Goals Patient Stated Goal: to help move as much as possible Progress towards PT goals: Progressing toward goals    Frequency    Min 3X/week      PT Plan Current plan remains appropriate    Co-evaluation              AM-PAC PT "6 Clicks" Mobility   Outcome Measure  Help needed turning from your back to your side while in a flat bed without using bedrails?: Total Help needed moving from lying on your back to sitting on the side of a flat bed without using bedrails?: Total Help needed moving to and from a bed to a chair (including a wheelchair)?: Total Help needed standing up from a chair using your arms (e.g., wheelchair or bedside chair)?: Total Help needed to walk in hospital room?: Total Help needed climbing 3-5 steps with a railing? : Total 6 Click Score: 6    End of Session Equipment Utilized During Treatment: Oxygen Activity Tolerance: Patient limited by fatigue;Other (comment) (limited by DOE) Patient left: in bed;with call bell/phone within reach Nurse Communication: Need for lift equipment PT Visit Diagnosis: Other abnormalities of gait and mobility (R26.89);Muscle weakness (generalized) (M62.81);History of falling (Z91.81);Repeated falls (R29.6);Pain Pain - Right/Left: Left Pain - part of body: Knee;Leg     Time: 3790-2409 PT Time Calculation (min) (ACUTE ONLY): 73 min  Charges:  $Therapeutic Activity: 68-82 mins                     Verdene Lennert, PT, DPT  Acute Rehabilitation Ortho Tech Supervisor [phone] pager [phone]) [phone] office

## 2021-07-13 NOTE — Telephone Encounter (Signed)
See about appt in November

## 2021-07-13 NOTE — Progress Notes (Addendum)
Patients family is concerned about him being on heparin and taking warfarin. I called the pharmacy and they said it takes a few days to start working and his INR is not above 2 yet. The family still wanted me to contact the doctor and I did but I have not received a call back. Will continue to monitor.

## 2021-07-13 NOTE — Progress Notes (Signed)
07/13/2021 Called to order Kreg bariatric tilt bed.  Pt would like to do his mobility (bath, PT, etc) at 4pm when his wife returns.  I asked RN to pre medicate him so we can coordinate all his care at that time.  [place] # is [phone] and Orlene Och rep is Kathrynn Ducking and his number is [phone].  Thanks,  Verdene Lennert, PT, DPT  Acute Rehabilitation Ortho Tech Supervisor [phone] pager [phone] office

## 2021-07-13 NOTE — Progress Notes (Signed)
Patient ID: Joseph [name]., male   DOB: 1956/05/25, 65 y.o.   MRN: [id number] Patient is postop day 3 split-thickness skin graft ulceration left knee.  There is 350 cc in the wound VAC canister.  Plan for physical therapy today.  Discharge planning based on therapy recommendations.  Would plan on discharging patient with a portable Praveena wound VAC pump.

## 2021-07-13 NOTE — Progress Notes (Signed)
CSW spoke with Anderson Malta at [facility]	[place] who confirms she is following the patient to possibly offer a bed.  Madilyn Fireman, MSW, LCSW Transitions of Care  Clinical Social Worker II [phone]

## 2021-07-13 NOTE — Progress Notes (Signed)
Inpatient Rehab Admissions Coordinator:   PT/OT continue to recommend SNF due to limited tolerance. It appears TOC is seeking SNF for Pt. In order to consider for CIR, Pt. Needs to be tolerating OOB for at least an hour a day and attempting to stand and bear weight.  CIR will sign off at this time, but MD may re-consult of therapy recommendations change.   Clemens Catholic, [place], [place] Admissions Coordinator  [phone] ([place]) [phone] (office)

## 2021-07-13 NOTE — TOC Progression Note (Addendum)
Transition of Care (TOC) - Progression Note    Patient Details  Name: Timohty Renbarger. MRN: [id number] Date of Birth: March 31, 1956  Transition of Care [facility]) CM/SW Contact  Curlene Labrum, RN Phone Number: 07/13/2021, 12:06 PM  Clinical Narrative:    Case management met with the patient and wife at the bedside to discuss transitions of care to skilled nursing facility.  The patient and wife, Eustaquio Maize, were unsure about choice regarding SNF placement for the patient.  I advised that the patient's wife visit the facilities for a tour to help assist with the selection.  CM called and spoke with [facility] and they do not have any open beds this week.  [facility] is checking on bed availability and will follow up.  [place] CM was made aware that the patient's wife would like to visit for tour.  [facility] does not have any available beds at this time and [facility] is unable to offer as well.  CM and MSW with [facility] will continue to work with the patient and wife regarding SNf placement.  07/13/2021 1252 - I called and spoke with the patient's wife on the phone and ask that she take a tour of [place] today to assist with SNF selection.  I also updated her that [facility] and [facility] did not have available beds for admission.  [place] and spoke with administrator at [facility] and she is unable to offer the patient a bed this week due to staffing issues - but she will continue to follow him for possibility of admission next week if staffing and bed availability improve.   Expected Discharge Plan: IP Rehab Facility Barriers to Discharge: Continued Medical Work up (Plan for return to OR with Dr. Sharol Given on 10/14- I&D)  Expected Discharge Plan and Services Expected Discharge Plan: [place] In-house Referral: Clinical Social Work Discharge Planning Services: CM Consult   Living arrangements for the past 2 months: Single Family Home                                        Social Determinants of Health (SDOH) Interventions    Readmission Risk Interventions Readmission Risk Prevention Plan 07/09/2021  Transportation Screening Complete  Medication Review Press photographer) Complete  PCP or Specialist appointment within 3-5 days of discharge Complete  HRI or Home Care Consult Complete  SW Recovery Care/Counseling Consult Complete  Skilled [place] Complete  Some recent data might be hidden

## 2021-07-13 NOTE — Progress Notes (Signed)
ANTICOAGULATION CONSULT NOTE - Follow Up Consult  Pharmacy Consult for Heparin Indication: h/o pulmonary embolus  Allergies  Allergen Reactions   Tape     Peels skin on legs   Chlorhexidine Rash    Wipes    Patient Measurements: Height: 6\' 1"  (185.4 cm) Weight: (!) 218 kg (480 lb 9.6 oz) IBW/kg (Calculated) : 79.9 Heparin Dosing Weight: 132.5 kg  Vital Signs: Temp: 97.7 F (36.5 C) (10/17 0700) Temp Source: Oral (10/17 0700) BP: 146/43 (10/17 0700) Pulse Rate: 72 (10/17 0523)  Labs: Recent Labs    07/11/21 0250 07/11/21 1031 07/12/21 0245 07/12/21 0954 07/13/21 0432  HGB 7.2*  --  7.0*  --  8.0*  HCT 23.9*  --  23.8*  --  27.5*  PLT 295  --  315  --  317  HEPARINUNFRC 0.28*   < > 0.18* 0.38 0.40  CREATININE 1.55*  --  1.83*  --  1.59*   < > = values in this interval not displayed.     Estimated Creatinine Clearance: 89.7 mL/min (A) (by C-G formula based on SCr of 1.59 mg/dL (H)).   Medications:  PTA Warfarin:  5 mg on Sun/Mon/Wed/Fri evening, and 7.5 mg on Tue/Thur/Sat evening.   INR was supratherapeutic (6.2) at admission  Assessment: 80 YOM on lifelong warfarin therapy for hx of PE, body habitus, and sedentary lifestyle admitted on 9/15 with left knee dislocation and effusion concerning for hematoma. At admission, warfarin was held, vitamin K was given for supratherapeutic INR, and pt was transfused.   Heparin level now therapeutic at 0.40 on gtt at 2900 units/hr. Hgb up to 8.0 s/p transfusion. plt stable at 317,000. No bleeding issues reported.   Goal of Therapy:  Heparin level 0.3-0.7 units/ml Monitor platelets by anticoagulation protocol: Yes   Plan:  Continue heparin at 2900 units/hr F/u confirmatory heparin level with AM labs Monitor daily CBC, s/sx bleeding   Kyllian Clingerman BS, PharmD, BCPS Please check AMION for all [place] contact numbers Clinical Pharmacist 07/13/2021 11:15 AM

## 2021-07-13 NOTE — Progress Notes (Addendum)
[facility] PROGRESS NOTE  Joseph [name]. NWG:[id number] DOB: September 20, 1956 DOA: 06/11/2021 PCP: Donald Prose, MD  Status: Remains inpatient appropriate because: Continued need for treatment of left knee wound, continues to have significant difficulty mobilizing secondary to morbid obesity and knee pain/wound; electrolyte abnormalities secondary to recurrent AKI; external fixator in place to LLE Anticipated disposition: Nursing facility for rehab From: Home Medically stable for discharge: Yes Difficult to place: No Barriers to discharge: Morbid obesity  Level of care: Progressive   Code Status: Full Family Communication:  DVT prophylaxis: Coumadin/IV heparin bridging COVID vaccination status: Unknown    HPI: 65 y.o. male with PMH significant for severe morbid obesity, OSA on CPAP, DM2, HTN, HLD, PAD, DVT on Coumadin, GERD, anxiety/depression, polyneuropathy, multiple falls who presented to the ED on 06/11/21 after fall at home resulting in left knee pain.  He was found to have left knee dislocation and fracture fragments with associated left knee effusion.  There were concern for possible left knee hematoma in setting of supratherapeutic INR.  Seen by orthopedic surgery, patient underwent closed reduction of left knee dislocation but no surgical management was planned because of patient's body habitus.  Patient is scheduled for a I&D on 07/08/2021 by Dr. Sharol Given.   Subjective: Alert.  Reports an adequate fit of BiPAP mask (new this admission) with both he and his wife stating air blows by and he has associated oxygen desaturations and has had a few episodes of NSVT.  Objective: Vitals:   07/12/21 2329 07/13/21 0523  BP: (!) 150/52 (!) 151/49  Pulse: 87 72  Resp: 19 18  Temp: 97.9 F (36.6 C) (!) 97.4 F (36.3 C)  SpO2: 98% 100%    Intake/Output Summary (Last 24 hours) at 07/13/2021 0808 Last data filed at 07/13/2021 0526 Gross per 24 hour  Intake 1347.73 ml  Output 2400  ml  Net -1052.27 ml   Filed Weights   06/25/21 0900 07/01/21 1518 07/08/21 0958  Weight: (!) 218 kg (!) 218 kg (!) 218 kg    Exam:  Constitutional: NAD, calm, frustrated over fit of BiPAP mask Respiratory: clear to auscultation bilaterally, no wheezing, no crackles. Normal respiratory effort. No accessory muscle use.  BiPAP in place Cardiovascular: Regular rate and rhythm, no murmurs / rubs / gallops.  Chronic appearing bilateral lower extremity edema.  Normotensive. Abdomen: no tenderness, no masses palpated.  Bowel sounds positive. LBM 10/16 Musculoskeletal: External fixation device on left lower extremity, wound VAC in place prior surgical wound.  Neurologic: CN 2-12 grossly intact. Sensation intact, DTR normal. Strength 5/5 x all 4 extremities.  Psychiatric: Normal judgment and insight. Alert and oriented x 3. Normal mood.    Assessment/Plan: Acute problems: Left knee dislocation status post left knee reduction external fixation with evacuation Left knee hematoma -Presented after fall.  X-ray w/ L knee dislocation and several fracture fragments/hematoma.  Orthopedic team initially recommended nonweightbearing but after most recent surgery he has transitioned to WBAT -MRI obtained 9/30: significant ligamentous injury/lateral patellar dislocation.  Orthopedics team revisited the decision and made a plan of surgical fixation.  -s/p L knee extensive debridement, reduction of knee dislocation, external fixation spanning of knee dislocation w/ wound VAC application. -s/p I&D on Wednesday, 07/08/2021 and Friday, 07/10/21 -/17 per orthopedic team ordered patient is now [place]    06/11/2021                  06/18/2021    History of pulmonary embolism/chronic anticoagulation with warfarin 10/17 there are  no immediate surgical procedures planned so restarting warfarin with pharmacy dosing with IV heparin for bridging   Recurrent AKI with recurrent hyperkalemia Baseline creatinine  1.2 w/ GFR  >60.  Creatinine as of 10/17 is 1.59 with a BUN of 77 and potassium 5.6 Was given low rate IV fluids over the weekend due to concerns over volume depletion-10/17 will decrease to [facility] Previously received Lokelma for hyperkalemia Repeat serum CK Literature review in Up-To-Date states that the most common causes of persistent hyperkalemia is impaired urinary potassium excretion due to reduced secretion of renal response to aldosterone in addition acute on chronic kidney disease and/or effective arterial blood volume depletion can also cause persistent hyperkalemia. Initiate hypoaldosteronism evaluation: Check random serum cortisol and aldosterone levels  Profound physical deconditioning Patient was able to ambulate into the hospital but due to severity of injury requiring surgery as well as orthopedic recommendation for nonweightbearing his ability to participate in therapies has been limited.  His significant elevation in BMI is also contributing to his ability to recover. As of 10/17 he is now allowed to bear weight on affected extremity. Agree with PT/OT recommendation for bariatric Kreg tilt bed-this will be most helpful in assisting this gentleman regain mobility.  He is at high risk for Orthostasis with position changes including changing from supine to seated. Mobilize only with assistance   Type 2 diabetes mellitus Diabetic neuropathy -A1c 6.1 on 9/22 -Currently on Actos, Semglee 50 units nightly with NovoLog 3 units 3 times daily and sliding cell insulin.   Hold off home oral hypoglycemics.   Essential hypertension BP is currently at goal Continue on amlodipine and lisinopril.   Hyperlipidemia -Pravastatin on hold.   Chronic normocytic anemia Baseline hemoglobin ~10.   S/p of 2 units of PRBCs and 1 unit of FFP.   Preoperatively hemoglobin 7.6-follow CBC daily   Transient bradycardia  1st degree heart block Asymptomatic in context of known sleep apnea but occurs while awake.   Patient has had several runs of nonsustained V. tach lasting 5 beats in context of ill fitting BiPAP mask EKG significant for 1st degree AV block which is chronic.  Telemetry with heart rate down to high 30s while awake Cardiology plans Zio patch at discharge.       Severe morbid obesity  -Body mass index is 63.41 kg/m. Patient has been advised to make an attempt to improve diet and exercise patterns to aid in weight loss. PT recommended Kreg bed.   GERD -Continue Protonix-if renal function does not rapidly improve will need to hold until renal function normal   OSA, compliant with CPAP Has been compliant with CPAP, encouraged to continue. -Continue BiPAP at HS as needed for naps Patient has reported inappropriate fit of BiPAP noting he was on continuous CPAP at home.  Have asked RT to assist with fitting of BiPAP mask and we will also perform a trial of CPAP overnight to see if he can transition back to home level of treatment   Constipation, opiate induced. -in setting of opiate use. -Continue Colace, Senokot, Miralax -Dulcolax suppository prn   Pressure injury -Mid-nose, not present on admission. Secondary to CPAP mask -10/17 begin foam dressing over bridge of nose     Data Reviewed: Basic Metabolic Panel: Recent Labs  Lab 07/07/21 0744 07/08/21 0137 07/09/21 0135 07/10/21 0115 07/11/21 0250 07/12/21 0245 07/13/21 0432  NA 137 134* 135 135 133* 134* 134*  K 4.9 5.2* 5.3* 5.7* 5.6* 5.9* 5.6*  CL 106 107 108  106 105 106 107  CO2 24 23 21* 25 22 23  20*  GLUCOSE 107* 245* 221* 217* 196* 201* 135*  BUN 61* 57* 53* 57* 61* 73* 77*  CREATININE 1.35* 1.21 1.13 1.47* 1.55* 1.83* 1.59*  CALCIUM 8.8* 8.5* 8.7* 8.9 8.6* 8.6* 8.6*  MG 2.4 2.3  --   --   --   --   --   PHOS 3.2 3.1  --   --   --   --   --    Liver Function Tests: Recent Labs  Lab 07/10/21 0115 07/13/21 0432  AST 12* 11*  ALT 5 <5  ALKPHOS 78 73  BILITOT 0.6 0.6  PROT 5.2* 5.4*  ALBUMIN 1.8* 1.9*     CBC: Recent Labs  Lab 07/09/21 0135 07/10/21 0115 07/11/21 0250 07/12/21 0245 07/13/21 0432  WBC 5.2 6.9 6.9 7.3 6.5  NEUTROABS  --  4.3 4.2 4.3 3.8  HGB 7.6* 7.6* 7.2* 7.0* 8.0*  HCT 26.3* 25.4* 23.9* 23.8* 27.5*  MCV 97.4 97.3 95.6 95.6 95.5  PLT 253 267 295 315 317   CBG: Recent Labs  Lab 07/12/21 0804 07/12/21 1152 07/12/21 1656 07/12/21 2139 07/13/21 0719  GLUCAP 163* 161* 113* 117* 128*      Scheduled Meds:  (feeding supplement) PROSource Plus  30 mL Oral BID BM   amLODipine  10 mg Oral Daily   vitamin C  1,000 mg Oral Daily   [START ON 07/15/2021] cephALEXin  250 mg Oral QHS   docusate sodium  100 mg Oral Daily   feeding supplement (GLUCERNA SHAKE)  237 mL Oral Q24H   FLUoxetine  40 mg Oral QPM   insulin aspart  0-20 Units Subcutaneous TID WC   insulin aspart  0-5 Units Subcutaneous QHS   insulin aspart  3 Units Subcutaneous TID WC   insulin glargine-yfgn  55 Units Subcutaneous QHS   methocarbamol  1,000 mg Oral BID   multivitamin with minerals  1 tablet Oral Daily   nutrition supplement (JUVEN)  1 packet Oral BID BM   pantoprazole  40 mg Oral Daily   pioglitazone  45 mg Oral Daily   polyethylene glycol  17 g Oral BID   Ensure Max Protein  11 oz Oral Daily   senna  1 tablet Oral Daily   tamsulosin  0.4 mg Oral Daily   zinc sulfate  220 mg Oral Daily   Continuous Infusions:  sodium chloride 75 mL/hr at 07/13/21 0236    ceFAZolin (ANCEF) IV 2 g (07/13/21 0506)   heparin 2,900 Units/hr (07/13/21 0459)   magnesium sulfate bolus IVPB     methocarbamol (ROBAXIN) IV      Principal Problem:   Left knee dislocation, initial encounter Active Problems:   Pure hypercholesterolemia   Essential hypertension, benign   OSA (obstructive sleep apnea)   Hx pulmonary embolism   Esophageal reflux   Diabetes mellitus type 2, uncontrolled   Diabetic neuropathy (HCC)   Acute renal failure superimposed on stage 3b chronic kidney disease (HCC)   Normocytic  anemia   Class 3 obesity (HCC)   Aortic atherosclerosis (HCC)   Coronary atherosclerosis   Effusion of left knee   Left knee dislocation   Pressure injury of skin   Unstageable pressure ulcer of sacral region ([place])   At risk for hemorrhage associated with anticoagulation therapy   Ulcer of left knee, with necrosis of bone ([place])   Open knee wound, left, subsequent encounter   Consultants: Orthopedics Cardiology  Procedures: Echocardiogram Left knee reduction with external fixation and evacuation of hematoma Left thigh debridement on 10/12 and again on 10/14  Antibiotics:    Time spent: 35 minutes    Erin Hearing ANP  [facility] 7 am - 330 pm/M-F for direct patient care and secure chat Please refer to Amion for contact info 31  days

## 2021-07-13 NOTE — Telephone Encounter (Signed)
Since he has had a prolonged hospital stay, we can hold off on the monitor for now and re-evaluate in OP clinic visit. Can you please help with getting him and appt after discharge?   Thanks  [employer] with the pts wife, Joseph [name] on [place], and she has not received the Zio patch yet at home and the pt is still in the hospital.. she is not sure of any D/C plans at this point.. I advised her to let us know when he gets home and we will set him up for an office visit... she does express worry that he may not mobile and may not be ale to come to the office.. I advised her not to worry that they will makes his plans for his outpatient visits at discharge.

## 2021-07-14 DIAGNOSIS — S83105A Unspecified dislocation of left knee, initial encounter: Secondary | ICD-10-CM | POA: Diagnosis not present

## 2021-07-14 LAB — RENAL FUNCTION PANEL
Albumin: 2.1 g/dL — ABNORMAL LOW (ref 3.5–5.0)
Anion gap: 6 (ref 5–15)
BUN: 73 mg/dL — ABNORMAL HIGH (ref 8–23)
CO2: 24 mmol/L (ref 22–32)
Calcium: 9.1 mg/dL (ref 8.9–10.3)
Chloride: 105 mmol/L (ref 98–111)
Creatinine, Ser: 1.31 mg/dL — ABNORMAL HIGH (ref 0.61–1.24)
GFR, Estimated: >60 mL/min (ref >60)
Glucose, Bld: 164 mg/dL — ABNORMAL HIGH (ref 70–99)
Phosphorus: 3.7 mg/dL (ref 2.5–4.6)
Potassium: 5.5 mmol/L — ABNORMAL HIGH (ref 3.5–5.1)
Sodium: 135 mmol/L (ref 135–145)

## 2021-07-14 LAB — CBC
HCT: 28.9 % — ABNORMAL LOW (ref 39.0–52.0)
Hemoglobin: 8.6 g/dL — ABNORMAL LOW (ref 13.0–17.0)
MCH: 28 pg (ref 26.0–34.0)
MCHC: 29.8 g/dL — ABNORMAL LOW (ref 30.0–36.0)
MCV: 94.1 fL (ref 80.0–100.0)
Platelets: 358 10*3/uL (ref 150–400)
RBC: 3.07 MIL/uL — ABNORMAL LOW (ref 4.22–5.81)
RDW: 16.5 % — ABNORMAL HIGH (ref 11.5–15.5)
WBC: 6.2 10*3/uL (ref 4.0–10.5)
nRBC: 0 % (ref 0.0–0.2)

## 2021-07-14 LAB — HEPARIN LEVEL (UNFRACTIONATED): Heparin Unfractionated: 0.31 IU/mL (ref 0.30–0.70)

## 2021-07-14 LAB — GLUCOSE, CAPILLARY
Glucose-Capillary: 129 mg/dL — ABNORMAL HIGH (ref 70–99)
Glucose-Capillary: 115 mg/dL — ABNORMAL HIGH (ref 70–99)
Glucose-Capillary: 122 mg/dL — ABNORMAL HIGH (ref 70–99)
Glucose-Capillary: 172 mg/dL — ABNORMAL HIGH (ref 70–99)

## 2021-07-14 LAB — PROTIME-INR
INR: 1.4 — ABNORMAL HIGH (ref 0.8–1.2)
Prothrombin Time: 16.9 seconds — ABNORMAL HIGH (ref 11.4–15.2)

## 2021-07-14 MED ORDER — LORAZEPAM 0.5 MG PO TABS
0.5000 mg | ORAL_TABLET | Freq: Four times a day (QID) | ORAL | Status: DC | PRN
  Administered 2021-07-15 – 2021-07-17 (×6): 0.5 mg via ORAL
  Filled 2021-07-14 (×6): qty 1

## 2021-07-14 MED ORDER — WARFARIN SODIUM 7.5 MG PO TABS
7.5000 mg | ORAL_TABLET | Freq: Once | ORAL | Status: AC
  Administered 2021-07-14: 7.5 mg via ORAL
  Filled 2021-07-14: qty 1

## 2021-07-14 NOTE — Telephone Encounter (Signed)
Patient has not received the monitor yet because according to the monitor order it was suppose to be mailed out in 3 weeks. We enrolled to be mailed to patient on 10/27. It has not shipped yet so we can cancel it if this is what you want.

## 2021-07-14 NOTE — Plan of Care (Signed)

## 2021-07-14 NOTE — Progress Notes (Addendum)
Nutrition Follow-up  DOCUMENTATION CODES:   Morbid obesity  INTERVENTION:   - ProSource Plus 30 ml po BID, each supplement provides 100 kcal and 15 grams of protein  - Ensure Max po daily, each supplement provides 150 kcal and 30 grams of protein  - Glucerna Shake po daily, each supplement provides 220 kcal and 10 grams of protein  - 1 packet Juven BID, each packet provides 95 calories, 2.5 grams of protein, and 9.8 grams of carbohydrate; also contains L-arginine and L-glutamine, vitamin C, vitamin E, vitamin B-12, zinc, calcium, and calcium Beta-hydroxy-Beta-methylbutyrate to support wound healing  - MVI with minerals daily  NUTRITION DIAGNOSIS:   Inadequate oral intake related to decreased appetite as evidenced by per patient/family report.  Progressing, being addressed via oral nutrition supplements  GOAL:   Patient will meet greater than or equal to 90% of their needs  Progressing  MONITOR:   PO intake, Supplement acceptance, Labs, Weight trends, Skin  REASON FOR ASSESSMENT:   Consult Assessment of nutrition requirement/status  ASSESSMENT:   65 y.o. male with medical history of morbid obesity, anxiety, depression, hx L knee DVT, PE (on chronic Coumadin), GERD, HTN, IDA, cervical spine stenosis, non-specific myalgias/myositis, OSA on CPAP, PVD, polyneuropathy, type 2 DM, HLD, and multiple falls. He presented to the ED due to a fall on 9/15. He was admitted for pain control and to monitor L leg for development of compartment syndrome.  10/06 - s/p L knee extensive debridement, reduction of knee dislocation, external fixation spanning of knee dislocation, and application of wound VAC 10/12 - s/p L thigh debridement, application of wound VAC 10/14 - s/p L knee and thigh debridement, application of skin graft, application of wound VAC  Attempted to speak with pt x 2 but pt unavailable working with nursing staff or therapies at time of attempted visits. Pt consuming oral  nutrition supplements per [facility] documentation. PO intakes have been variable but improved overall. No new weights since 07/08/21.  Meal Completion: 0-100% x last 8 documented meals  Medications reviewed and include: ProSource Plus 30 ml BID, vitamin C 1000 mg daily, colace, Glucerna daily, SSI, novolog 3 units TID with meals, semglee 55 units daily, MVI with minerals, Juven BID, protonix, actos, miralax, Ensure Max daily, senna, warfarin, zinc sulfate 220 mg daily, IV abx, heparin drip  Labs reviewed: potassium 5.5, BUN 73, creatinine 1.31, hemoglobin 8.6 CBG's: 114-180 x 24 hours  UOP: 1750 ml x 24 hours  Diet Order:   Diet Order             Diet Carb Modified Fluid consistency: Thin; Room service appropriate? Yes  Diet effective now                   EDUCATION NEEDS:   No education needs have been identified at this time  Skin:  Skin Assessment:  Skin Integrity Issues: Stage II: nose Wound VAC: L leg Incisions: L leg Other: venous stasis ulcers to R and L pretibial areas; open blister to L anterior thigh; skin tear to R buttock  Last BM:  07/12/21  Height:   Ht Readings from Last 1 Encounters:  07/08/21 6\' 1"  (1.854 m)    Weight:   Wt Readings from Last 1 Encounters:  07/08/21 (!) 218 kg    Ideal Body Weight:  83.6 kg  BMI:  Body mass index is 63.41 kg/m.  Estimated Nutritional Needs:   Kcal:  2300-2500 kcal  Protein:  115-135 grams  Fluid:  >/=  2.4 L/day    Gustavus Bryant, MS, RD, LDN Inpatient Clinical Dietitian Please see AMiON for contact information.

## 2021-07-14 NOTE — Progress Notes (Signed)
ANTICOAGULATION CONSULT NOTE - Follow Up Consult  Pharmacy Consult for Heparin/Warfarin Indication: h/o pulmonary embolus  Allergies  Allergen Reactions   Tape     Peels skin on legs   Chlorhexidine Rash    Wipes    Patient Measurements: Height: 6\' 1"  (185.4 cm) Weight: (!) 218 kg (480 lb 9.6 oz) IBW/kg (Calculated) : 79.9 Heparin Dosing Weight: 132.5 kg  Vital Signs: Temp: 98.1 F (36.7 C) (10/18 0751) Temp Source: Oral (10/18 0751) BP: 155/53 (10/18 0751) Pulse Rate: 77 (10/18 0402)  Labs: Recent Labs    07/12/21 0245 07/12/21 0954 07/13/21 0432 07/13/21 1304 07/14/21 0336  HGB 7.0*  --  8.0*  --  8.6*  HCT 23.8*  --  27.5*  --  28.9*  PLT 315  --  317  --  358  LABPROT  --   --   --   --  16.9*  INR  --   --   --   --  1.4*  HEPARINUNFRC 0.18* 0.38 0.40  --  0.31  CREATININE 1.83*  --  1.59*  --  1.31*  CKTOTAL  --   --   --  17*  --      Estimated Creatinine Clearance: 108.9 mL/min (A) (by C-G formula based on SCr of 1.31 mg/dL (H)).   Medications:  PTA Warfarin:  5 mg on Sun/Mon/Wed/Fri evening, and 7.5 mg on Tue/Thur/Sat evening.   INR was supratherapeutic (6.2) at admission  Assessment: 52 YOM on lifelong warfarin therapy for hx of PE, body habitus, and sedentary lifestyle admitted on 9/15 with left knee dislocation and effusion concerning for hematoma. At admission, warfarin was held, vitamin K was given for supratherapeutic INR (6.2), and pt was transfused.   Heparin level now therapeutic at 0.31 on gtt at 2900 units/hr. Hgb is 8.6.  Plt stable at 315,000. No bleeding issues reported.   Goal of Therapy:  Heparin level 0.3-0.7 units/ml Monitor platelets by anticoagulation protocol: Yes   Plan:  Continue heparin at 2900 units/hr Heparin level with AM labs Coumadin 7.5 mg po X1 today. INR remains subtherapeutic at this time warranting overlap of heparin given history of PE Monitor daily CBC, INR, and s/sx bleeding   Lamar Naef BS, PharmD,  BCPS Please check AMION for all [place] contact numbers Clinical Pharmacist 07/14/2021 10:25 AM

## 2021-07-14 NOTE — Telephone Encounter (Signed)
Okay to cancel per APP request.  Need for monitor is to be reevaluated at post hospital follow up.  Pt still admitted.

## 2021-07-14 NOTE — Progress Notes (Signed)
Occupational Therapy Treatment Patient Details Name: Joseph [name]. MRN: [id number] DOB: 06-01-1956 Today's Date: 07/14/2021   History of present illness Joseph [name] is a 65 yo male who presnets with increased LLE swelling after recent fall at home. 06/11/21 pt fell when getting off x-ray table at hospital. X-rays showed a knee dislocation which was confirmed on CT scan as a posterior lateral dislocation; L knee reduced and L KI placed. Lumbar and pelvic xrays negative. 10/5: left knee dislocation reduction with external fixation placement and extensive debridement of hematoma and devitalized skin subtenons tissue muscle fascia Placement of wound VAC; Wound debridement on 10/12. 10/14 L knee and thigh debridement with skin graft, now WBAT L LE.  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   OT comments  Initiated tilt this am as cotreat with PT to determine best positioning and problem solving due to external fixator. PT educated on process of tilting and pt states "my anxiety is through the roof". Able to tolerate 26 degrees (BP 134/65; HR82) with VSS. Attempted to increase to 34 degrees however pt complaining of dizziness and anxiety increased. Pt would not remove CPAP and trial Davy at this time. Educated pt previously on importance of working on bed level exercises and helping to complete his ADL tasks as able. Messaged NP/MD about managing anxiety to help progress mobility. Will continue to follow acutely.    Recommendations for follow up therapy are one component of a multi-disciplinary discharge planning process, led by the attending physician.  Recommendations may be updated based on patient status, additional functional criteria and insurance authorization.    Follow Up Recommendations  SNF (Would benefit from increased frequency to work toward appropriateness for CIR)    Equipment Recommendations  3 in 1 bedside commode    Recommendations for  Other Services      Precautions / Restrictions Precautions Precautions: Fall Precaution Comments: external fixation; wound vac; anxiety Restrictions LLE Weight Bearing: Weight bearing as tolerated       Mobility Bed Mobility Overal bed mobility: Needs Assistance             General bed mobility comments: With Trendelenburg feature adn use of upper handle bars, pt able to assist with pulling up in bed; requires encouragement;  able to use hand rails to assist with  lateral leans    Transfers                      Balance             Standing balance-Leahy Scale: Poor Standing balance comment: using tilt; pulling/pushing on rails to offload LLE                           ADL either performed or assessed with clinical judgement   ADL Overall ADL's : Needs assistance/impaired                                       General ADL Comments: focus of session on mobility; encouraged pt previously to participate adn complete UB ADL @ bed level; pt assited with lifting pannus     Vision       Perception     Praxis      Cognition Arousal/Alertness: Awake/alert Behavior During Therapy: Anxious Overall Cognitive Status: Within Functional Limits for  tasks assessed                                          Exercises Exercises: Other exercises Other Exercises Other Exercises: Pt given theraband to complete with BUE; has not worked with theraband. Discussed importance of completing strengthening exercises on his own with goal of completing exercises 3x/day Educated on cross body exercise to work on core strengthening   Shoulder Instructions       General Comments Tilt feature engarged. VSS    Pertinent Vitals/ Pain       Pain Assessment: 0-10 Pain Score: 8  Pain Location: L knee (with weight bearing) Pain Descriptors / Indicators: Grimacing;Aching;Sore;Discomfort;Guarding Pain Intervention(s): Premedicated before  session;Limited activity within patient's tolerance  Home Living                                          Prior Functioning/Environment              Frequency  Min 2X/week        Progress Toward Goals  OT Goals(current goals can now be found in the care plan section)  Progress towards OT goals: Progressing toward goals  Acute Rehab OT Goals Patient Stated Goal: to help move as much as possible OT Goal Formulation: With patient/family Time For Goal Achievement: 07/16/21 Potential to Achieve Goals: Good ADL Goals Pt Will Perform Upper Body Bathing: with set-up;sitting Pt/caregiver will Perform Home Exercise Program: Both right and left upper extremity;With theraband;Independently;With written HEP provided Additional ADL Goal #1: Pt will roll R/L with 1 person min A to help with ADL tasks Additional ADL Goal #2: PT will tolerate standing in Kreg tilt bed for 10 minutes to build up strength in RLE adn improve standing tolerance Additional ADL Goal #3: Pt will verbalize 2 strategies to reduce risk of MASD under pannus  Plan Discharge plan needs to be updated    Co-evaluation    PT/OT/SLP Co-Evaluation/Treatment: Yes Reason for Co-Treatment: Complexity of the patient's impairments (multi-system involvement);For patient/therapist safety;To address functional/ADL transfers   OT goals addressed during session: ADL's and self-care;Strengthening/ROM      AM-PAC OT "6 Clicks" Daily Activity     Outcome Measure   Help from another person eating meals?: None Help from another person taking care of personal grooming?: A Little Help from another person toileting, which includes using toliet, bedpan, or urinal?: Total Help from another person bathing (including washing, rinsing, drying)?: A Lot Help from another person to put on and taking off regular upper body clothing?: A Lot Help from another person to put on and taking off regular lower body clothing?:  Total 6 Click Score: 13    End of Session Equipment Utilized During Treatment: Oxygen (CPAP)  OT Visit Diagnosis: Unsteadiness on feet (R26.81);Other abnormalities of gait and mobility (R26.89);Muscle weakness (generalized) (M62.81);History of falling (Z91.81);Pain Pain - Right/Left: Left Pain - part of body: Leg   Activity Tolerance Patient limited by pain;Other (comment) (limited by anxiety)   Patient Left in bed;with call bell/phone within reach;with family/visitor present (unable to put into chair position due to external fixator)   Nurse Communication          Time: 2202-5427 OT Time Calculation (min): 35 min  Charges: OT General Charges $OT Visit: 1 Visit  OT Treatments $Self Care/Home Management : 8-22 mins  Maurie Boettcher, OT/L   Acute OT Clinical Specialist Acute Rehabilitation Services Pager [phone] Office [phone]   [facility] 07/14/2021, 11:50 AM

## 2021-07-14 NOTE — Progress Notes (Signed)
Physical Therapy Treatment Patient Details Name: Joseph [name]. MRN: [id number] DOB: March 30, 1956 Today's Date: 07/14/2021   History of Present Illness Joseph [name] is a 65 yo male who presnets with increased LLE swelling after recent fall at home. 06/11/21 pt fell when getting off x-ray table at hospital. X-rays showed a knee dislocation which was confirmed on CT scan as a posterior lateral dislocation; L knee reduced and L KI placed. Lumbar and pelvic xrays negative. 10/5: left knee dislocation reduction with external fixation placement and extensive debridement of hematoma and devitalized skin subtenons tissue muscle fascia Placement of wound VAC; Wound debridement on 10/12. 10/14 L knee and thigh debridement with skin graft, now WBAT L LE.  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    PT Comments    PT and OT saw pt together to initiate tilt training, facilitate functional semi-standing, and problem solve correct technique for pt. Pt very limited by anxiety this session, states his anxiety is "through the roof" when discussing tilting procedures. Pt tolerated 26 degree tilt well, with facilitating knee extension on RLE and shifting trunk as needed with Ues to offweight LLE.  PT instructed pt in LE exercises to perform independently or with assist from wife, strap provided. PT to progress mobility as tolerated.     Recommendations for follow up therapy are one component of a multi-disciplinary discharge planning process, led by the attending physician.  Recommendations may be updated based on patient status, additional functional criteria and insurance authorization.  Follow Up Recommendations  SNF     Equipment Recommendations  Hospital bed;Other (comment) (>500 lb capacity lift equipment, bariatric bed, bariatric WC >22x22" (may need 24" or larger))    Recommendations for Other Services       Precautions / Restrictions  Precautions Precautions: Fall Precaution Comments: RLE external fixation; wound vac; anxiety Restrictions LLE Weight Bearing: Weight bearing as tolerated     Mobility  Bed Mobility Overal bed mobility: Needs Assistance             General bed mobility comments: With Trendelenburg feature adn use of upper handle bars, pt able to assist with pulling up in bed with PT providing mod boost assist with bed pad; requires encouragement;  able to use hand rails to assist with  lateral leans    Transfers                 General transfer comment: utilized tilt bed  Ambulation/Gait                 Stairs             Wheelchair Mobility    Modified Rankin (Stroke Patients Only)       Balance Overall balance assessment: Needs assistance           Standing balance-Leahy Scale: Poor Standing balance comment: using tilt; pulling/pushing on rails to offload LLE                            Cognition Arousal/Alertness: Awake/alert Behavior During Therapy: Anxious Overall Cognitive Status: Within Functional Limits for tasks assessed                                 General Comments: pt states he must keep his CPAP on at all times or he gets short of breath, states "  when they take it off, I get so short of breath but they say my numbers are fine". Pt expresses anxiety this session, states it has been bad since yesterday .      Exercises General Exercises - Lower Extremity Quad Sets: AAROM;Both;10 reps;Standing;Other (comment) (semi-standing in tilt bed, facilitating knee extension) Heel Raises: AAROM;Both;5 reps;Standing Other Exercises Other Exercises: Pt given theraband to complete with BUE; has not worked with theraband. Discussed importance of completing strengthening exercises on his own with goal of completing exercises 3x/day    General Comments General comments (skin integrity, edema, etc.): Tilted to 26 deg tolerating x10  minutes there total, lasted <1 minute tilted to 35 deg. VSS      Pertinent Vitals/Pain Pain Assessment: 0-10 Pain Score: 8  Pain Location: L knee, during tilting Pain Descriptors / Indicators: Grimacing;Aching;Sore;Discomfort;Guarding Pain Intervention(s): Limited activity within patient's tolerance;Monitored during session;Repositioned    Home Living                      Prior Function            PT Goals (current goals can now be found in the care plan section) Acute Rehab PT Goals Patient Stated Goal: to help move as much as possible PT Goal Formulation: With patient/family Time For Goal Achievement: 07/20/21 Potential to Achieve Goals: Fair Progress towards PT goals: Progressing toward goals    Frequency    Min 3X/week      PT Plan Current plan remains appropriate    Co-evaluation   Reason for Co-Treatment: Complexity of the patient's impairments (multi-system involvement);To address functional/ADL transfers;For patient/therapist safety PT goals addressed during session: Mobility/safety with mobility;Balance;Strengthening/ROM OT goals addressed during session: ADL's and self-care;Strengthening/ROM      AM-PAC PT "6 Clicks" Mobility   Outcome Measure  Help needed turning from your back to your side while in a flat bed without using bedrails?: Total Help needed moving from lying on your back to sitting on the side of a flat bed without using bedrails?: Total Help needed moving to and from a bed to a chair (including a wheelchair)?: Total Help needed standing up from a chair using your arms (e.g., wheelchair or bedside chair)?: Total Help needed to walk in hospital room?: Total Help needed climbing 3-5 steps with a railing? : Total 6 Click Score: 6    End of Session Equipment Utilized During Treatment: Oxygen (CPAP) Activity Tolerance: Patient limited by fatigue;Other (comment) (limited by DOE) Patient left: in bed;with call bell/phone within  reach;with family/visitor present Nurse Communication: Need for lift equipment;Mobility status PT Visit Diagnosis: Other abnormalities of gait and mobility (R26.89);Muscle weakness (generalized) (M62.81);History of falling (Z91.81);Repeated falls (R29.6);Pain Pain - Right/Left: Left Pain - part of body: Knee;Leg     Time: 9323-5573 PT Time Calculation (min) (ACUTE ONLY): 38 min  Charges:  $Therapeutic Activity: 8-22 mins                     Stacie Glaze, PT DPT Acute Rehabilitation Services Pager [phone]  Office [phone]    Roxine Caddy E Ruffin Pyo 07/14/2021, 12:29 PM

## 2021-07-14 NOTE — Progress Notes (Signed)
[facility] PROGRESS NOTE  Joseph [name]. PNT:[id number] DOB: 12-Jul-1956 DOA: 06/11/2021 PCP: Donald Prose, MD  Status: Remains inpatient appropriate because: Continued need for treatment of left knee wound, continues to have significant difficulty mobilizing secondary to morbid obesity and knee pain/wound; electrolyte abnormalities secondary to recurrent AKI; external fixator in place to LLE Anticipated disposition: Nursing facility for rehab: LTAC vs Inpt Rehab vs SNF From: Home Medically stable for discharge: No-although patient has no further planned operative procedures his degree of physical deconditioning is such that he is not yet appropriate for outpatient level of therapy such as would be provided at inpatient rehab or at a nursing facility.  Discussed with OT/PT and recommendation is that now he is able to bear weight on affected extremity we will begin a more aggressive therapy regimen utilizing our STAR program in hopes that patient will improve enough to transition to inpatient rehab although this remains to be seen.  Also considering LTAC Difficult to place: No Barriers to discharge: Morbid obesity  Level of care: Progressive   Code Status: Full Family Communication:  DVT prophylaxis: Coumadin/IV heparin bridging COVID vaccination status: Unknown    HPI: 65 y.o. male with PMH significant for severe morbid obesity, OSA on CPAP, DM2, HTN, HLD, PAD, DVT on Coumadin, GERD, anxiety/depression, polyneuropathy, multiple falls who presented to the ED on 06/11/21 after fall at home resulting in left knee pain.  He was found to have left knee dislocation and fracture fragments with associated left knee effusion.  There were concern for possible left knee hematoma in setting of supratherapeutic INR.  Seen by orthopedic surgery, patient underwent closed reduction of left knee dislocation but no surgical management was planned because of patient's body habitus.  Patient is scheduled  for a I&D on 07/08/2021 by Dr. Sharol Given.   Subjective: Patient alert and oriented.  Wife at bedside.  Questions answered regarding rationale for giving heparin with Coumadin.  Also informed patient and wife about STAR program and need to reset or PT and OT evaluations and he is now able to place weight on affected extremity.  Objective: Vitals:   07/13/21 2338 07/14/21 0402  BP: (!) 151/57 (!) 152/42  Pulse: 83 77  Resp: 20 18  Temp: 97.9 F (36.6 C) 98.5 F (36.9 C)  SpO2: 92% 96%    Intake/Output Summary (Last 24 hours) at 07/14/2021 0807 Last data filed at 07/14/2021 0540 Gross per 24 hour  Intake 120 ml  Output 1750 ml  Net -1630 ml   Filed Weights   06/25/21 0900 07/01/21 1518 07/08/21 0958  Weight: (!) 218 kg (!) 218 kg (!) 218 kg    Exam:  Constitutional: NAD, calm, tolerated CPAP overnight Respiratory: clear to auscultation bilaterally, Normal respiratory effort. No accessory muscle use.  CPAP in place Cardiovascular: S1-S2, regular pulse with frequent PVCs occasionally in a bigeminy pattern, chronic appearing bilateral lower extremity edema.  Normotensive. Abdomen: no tenderness, no masses palpated.  Bowel sounds positive. LBM 10/17-patient reports was incontinent and had no awareness that he had passed a BM.  He states BM occurred when he thought he was passing gas. Musculoskeletal: External fixation device on left lower extremity, wound VAC in place prior surgical wound.  Neurologic: CN 2-12 grossly intact. Sensation intact, DTR normal. Strength 5/5 x all 4 extremities.  Psychiatric: Normal judgment and insight. Alert and oriented x 3. Normal mood.    Assessment/Plan: Acute problems: Left knee dislocation status post left knee reduction external fixation with evacuation Left  knee hematoma -Presented after fall.  X-ray w/ L knee dislocation and several fracture fragments/hematoma.  Orthopedic team initially recommended nonweightbearing but after most recent surgery  he has transitioned to WBAT -MRI obtained 9/30: significant ligamentous injury/lateral patellar dislocation.  Orthopedics team revisited the decision and made a plan of surgical fixation.  -s/p L knee extensive debridement, reduction of knee dislocation, external fixation spanning of knee dislocation w/ wound VAC application. -s/p I&D on Wednesday, 07/08/2021 and Friday, 07/10/21 -/17 per orthopedic team ordered patient is now WBAT    06/11/2021                  06/18/2021     07/14/2021                 07/14/2021     History of pulmonary embolism/chronic anticoagulation with warfarin 10/17 there are no immediate surgical procedures planned so restarting warfarin with pharmacy dosing with IV heparin for bridging   Recurrent AKI with recurrent hyperkalemia Baseline creatinine  1.2 w/ GFR >60.  Creatinine as of 10/18 is 1.31 with a BUN of 73 and potassium 5.5 Previously received Lokelma for hyperkalemia-once hypo aldosterone labs obtained we will give one-time dose of Lasix to see if improves renal function and potassium levels.  No significant improvement after hydration Literature review in Up-To-Date states that the most common causes of persistent hyperkalemia is impaired urinary potassium excretion due to reduced secretion of renal response to aldosterone in addition acute on chronic kidney disease and/or effective arterial blood volume depletion can also cause persistent hyperkalemia. Initiate hypoaldosteronism evaluation: Check random serum cortisol and aldosterone levels Random cortisol normal, CK low at 17  Profound physical deconditioning Patient was able to ambulate into the hospital but due to severity of injury requiring surgery as well as orthopedic recommendation for nonweightbearing his ability to participate in therapies has been limited.  His significant elevation in BMI is also contributing to his ability to recover. 10/17 now allowed to bear weight on affected  extremity. Continue bariatric Kreg tilt bed-this will be most helpful in assisting this gentleman regain mobility.  He is at high risk for Orthostasis with position changes including changing from supine to seated therefore orthostatic vital signs ordered. Mobilize only with assistance Having significant anxiety which is contributing to inability to tolerate tilt bed maneuvers to sit up.  We will begin Ativan 0.5 mg 30 minutes before therapy sessions   Type 2 diabetes mellitus Diabetic neuropathy -A1c 6.1 on 9/22 -Currently on Actos, Semglee 50 units nightly with NovoLog 3 units 3 times daily and SSI   Essential hypertension BP is currently at goal Continue on amlodipine and lisinopril.   Hyperlipidemia Pravastatin on hold.   Chronic normocytic anemia Baseline hemoglobin ~10.   S/p of 2 units of PRBCs and 1 unit of FFP.   Preoperatively hemoglobin 7.6-follow CBC daily-current Hgb 8.6   Transient bradycardia  1st degree heart block Asymptomatic in context of known sleep apnea but occurs while awake.  Patient has had several runs of nonsustained V. tach lasting 5 beats in context of ill fitting BiPAP mask-no longer on BiPAP and tolerating CPAP at bedtime Still having frequent runs of unifocal PVCs and at times bigeminy EKG significant for 1st degree AV block which is chronic.  Telemetry with heart rate down to high 30s while awake Cardiology plans Zio patch at discharge.       Severe morbid obesity  -Body mass index is 63.41 kg/m. Patient has been advised to  make an attempt to improve diet and exercise patterns to aid in weight loss. PT recommended Kreg bed.   GERD -Continue Protonix-if renal function does not rapidly improve will need to hold until renal function normal   OSA, compliant with CPAP Has been compliant with CPAP, encouraged to continue. Tolerated CPAP overnight so have discontinued BiPAP At home was on nocturnal CPAP but requiring more often during current  hospitalization likely related to physical deconditioning and spending more time in bed as opposed to up walking around   Constipation, opiate induced. -in setting of opiate use. -Continue Colace, Senokot, Miralax -Dulcolax suppository prn   Pressure injury -Mid-nose, not present on admission. Secondary to CPAP mask -10/17 begin foam dressing over bridge of nose     Data Reviewed: Basic Metabolic Panel: Recent Labs  Lab 07/08/21 0137 07/09/21 0135 07/10/21 0115 07/11/21 0250 07/12/21 0245 07/13/21 0432 07/14/21 0336  NA 134*   < > 135 133* 134* 134* 135  K 5.2*   < > 5.7* 5.6* 5.9* 5.6* 5.5*  CL 107   < > 106 105 106 107 105  CO2 23   < > 25 22 23  20* 24  GLUCOSE 245*   < > 217* 196* 201* 135* 164*  BUN 57*   < > 57* 61* 73* 77* 73*  CREATININE 1.21   < > 1.47* 1.55* 1.83* 1.59* 1.31*  CALCIUM 8.5*   < > 8.9 8.6* 8.6* 8.6* 9.1  MG 2.3  --   --   --   --   --   --   PHOS 3.1  --   --   --   --   --  3.7   < > = values in this interval not displayed.   Liver Function Tests: Recent Labs  Lab 07/10/21 0115 07/13/21 0432 07/14/21 0336  AST 12* 11*  --   ALT 5 <5  --   ALKPHOS 78 73  --   BILITOT 0.6 0.6  --   PROT 5.2* 5.4*  --   ALBUMIN 1.8* 1.9* 2.1*    CBC: Recent Labs  Lab 07/10/21 0115 07/11/21 0250 07/12/21 0245 07/13/21 0432 07/14/21 0336  WBC 6.9 6.9 7.3 6.5 6.2  NEUTROABS 4.3 4.2 4.3 3.8  --   HGB 7.6* 7.2* 7.0* 8.0* 8.6*  HCT 25.4* 23.9* 23.8* 27.5* 28.9*  MCV 97.3 95.6 95.6 95.5 94.1  PLT 267 295 315 317 358   CBG: Recent Labs  Lab 07/12/21 2139 07/13/21 0719 07/13/21 1243 07/13/21 1706 07/13/21 2201  GLUCAP 117* 128* 114* 128* 180*      Scheduled Meds:  (feeding supplement) PROSource Plus  30 mL Oral BID BM   amLODipine  10 mg Oral Daily   vitamin C  1,000 mg Oral Daily   [START ON 07/15/2021] cephALEXin  250 mg Oral QHS   docusate sodium  100 mg Oral Daily   feeding supplement (GLUCERNA SHAKE)  237 mL Oral Q24H   FLUoxetine   40 mg Oral QPM   insulin aspart  0-20 Units Subcutaneous TID WC   insulin aspart  0-5 Units Subcutaneous QHS   insulin aspart  3 Units Subcutaneous TID WC   insulin glargine-yfgn  55 Units Subcutaneous QHS   methocarbamol  1,000 mg Oral BID   multivitamin with minerals  1 tablet Oral Daily   nutrition supplement (JUVEN)  1 packet Oral BID BM   pantoprazole  40 mg Oral Daily   pioglitazone  45 mg Oral Daily  polyethylene glycol  17 g Oral BID   Ensure Max Protein  11 oz Oral Daily   senna  1 tablet Oral Daily   tamsulosin  0.4 mg Oral Daily   Warfarin - Pharmacist Dosing Inpatient   Does not apply q1600   zinc sulfate  220 mg Oral Daily   Continuous Infusions:  sodium chloride 10 mL/hr at 07/13/21 1107    ceFAZolin (ANCEF) IV 2 g (07/14/21 0545)   heparin 2,900 Units/hr (07/14/21 0144)   magnesium sulfate bolus IVPB     methocarbamol (ROBAXIN) IV      Principal Problem:   Left knee dislocation, initial encounter Active Problems:   Pure hypercholesterolemia   Essential hypertension, benign   OSA (obstructive sleep apnea)   Hx pulmonary embolism   Esophageal reflux   Diabetes mellitus type 2, uncontrolled   Diabetic neuropathy (HCC)   Acute renal failure superimposed on stage 3b chronic kidney disease (HCC)   Normocytic anemia   Class 3 obesity (HCC)   Aortic atherosclerosis (HCC)   Coronary atherosclerosis   Effusion of left knee   Left knee dislocation   Pressure injury of skin   Unstageable pressure ulcer of sacral region ([place])   At risk for hemorrhage associated with anticoagulation therapy   Ulcer of left knee, with necrosis of bone ([place])   Open knee wound, left, subsequent encounter   Consultants: Orthopedics Cardiology  Procedures: Echocardiogram Left knee reduction with external fixation and evacuation of hematoma Left thigh debridement on 10/12 and again on 10/14  Antibiotics: Cephalexin 9/15 through 10/4 Cefazolin 10/5 >>   Time spent: 35  minutes    Erin Hearing ANP  [facility] 7 am - 330 pm/M-F for direct patient care and secure chat Please refer to Amion for contact info 32  days

## 2021-07-15 DIAGNOSIS — S83105A Unspecified dislocation of left knee, initial encounter: Secondary | ICD-10-CM | POA: Diagnosis not present

## 2021-07-15 LAB — PROTIME-INR
INR: 1.8 — ABNORMAL HIGH (ref 0.8–1.2)
Prothrombin Time: 20.5 seconds — ABNORMAL HIGH (ref 11.4–15.2)

## 2021-07-15 LAB — RENAL FUNCTION PANEL
Albumin: 2 g/dL — ABNORMAL LOW (ref 3.5–5.0)
Anion gap: 7 (ref 5–15)
BUN: 71 mg/dL — ABNORMAL HIGH (ref 8–23)
CO2: 22 mmol/L (ref 22–32)
Calcium: 8.7 mg/dL — ABNORMAL LOW (ref 8.9–10.3)
Chloride: 100 mmol/L (ref 98–111)
Creatinine, Ser: 1.36 mg/dL — ABNORMAL HIGH (ref 0.61–1.24)
GFR, Estimated: 58 mL/min — ABNORMAL LOW (ref >60)
Glucose, Bld: 199 mg/dL — ABNORMAL HIGH (ref 70–99)
Phosphorus: 3.6 mg/dL (ref 2.5–4.6)
Potassium: 5 mmol/L (ref 3.5–5.1)
Sodium: 129 mmol/L — ABNORMAL LOW (ref 135–145)

## 2021-07-15 LAB — CBC
HCT: 27.3 % — ABNORMAL LOW (ref 39.0–52.0)
Hemoglobin: 8.1 g/dL — ABNORMAL LOW (ref 13.0–17.0)
MCH: 28 pg (ref 26.0–34.0)
MCHC: 29.7 g/dL — ABNORMAL LOW (ref 30.0–36.0)
MCV: 94.5 fL (ref 80.0–100.0)
Platelets: 383 10*3/uL (ref 150–400)
RBC: 2.89 MIL/uL — ABNORMAL LOW (ref 4.22–5.81)
RDW: 16.6 % — ABNORMAL HIGH (ref 11.5–15.5)
WBC: 6.5 10*3/uL (ref 4.0–10.5)
nRBC: 0.3 % — ABNORMAL HIGH (ref 0.0–0.2)

## 2021-07-15 LAB — GLUCOSE, CAPILLARY
Glucose-Capillary: 164 mg/dL — ABNORMAL HIGH (ref 70–99)
Glucose-Capillary: 157 mg/dL — ABNORMAL HIGH (ref 70–99)
Glucose-Capillary: 176 mg/dL — ABNORMAL HIGH (ref 70–99)
Glucose-Capillary: 186 mg/dL — ABNORMAL HIGH (ref 70–99)

## 2021-07-15 LAB — HEPARIN LEVEL (UNFRACTIONATED): Heparin Unfractionated: 0.34 IU/mL (ref 0.30–0.70)

## 2021-07-15 MED ORDER — FLUOXETINE HCL 20 MG PO CAPS
60.0000 mg | ORAL_CAPSULE | Freq: Every evening | ORAL | Status: DC
  Administered 2021-07-15 – 2021-07-29 (×15): 60 mg via ORAL
  Filled 2021-07-15 (×16): qty 3

## 2021-07-15 MED ORDER — WARFARIN SODIUM 7.5 MG PO TABS
7.5000 mg | ORAL_TABLET | Freq: Once | ORAL | Status: AC
  Administered 2021-07-15: 7.5 mg via ORAL
  Filled 2021-07-15: qty 1

## 2021-07-15 NOTE — TOC Progression Note (Addendum)
Transition of Care (TOC) - Progression Note    Patient Details  Name: Joseph C Swanton Jr. MRN: [id number] Date of Birth: 05/06/1956  Transition of Care (TOC) CM/SW Contact  Michelle R Stubbldfield, RN Phone Number: 07/15/2021, 11:42 AM  Clinical Narrative:    Case management met with the patient and his wife at the bedside to discuss Transitions of Care to a skilled nursing facility for physical rehabilitation.  The patient states that he expressed anxiety during therapy yesterday and fear relating to pain after surgery and weightbearing.  I discussed with the patient and his wife that the goal is go get the patient to an acceptable rehabilitation facility so that he can safely transition to home with his wife.  The patient's wife was given a list of accepting SNF facilities in the hub and given Medicare.gov list to assist with comparing facilities for admission.  [facility] and [facility] do not have available beds today for placement and wife is aware.  I asked that the wife visit both of the [facility] facilities to tour the facilities so that she could make an informed choice.  CM spoke with the patient's wife concerning the SNF facilities offered in the hub and states that she would like to find an acceptable facility with good ratings if possible.  I encouraged her to visit the facilities.  The patient's wife would like to keep him in the [place] area if possible.  I answered her questions regarding SNF facilities.  CM also reach out to various facilities:  [facility] - left message to check on bed availability [facility] - left message to check on bed availability [facility] - No beds available - full capacity [facility] - No beds available [facility] - No bed available  CM and MSW with DTP Team will continue to follow the patient for discharge planning to SNF versus CIR placement.  07/15/2021 1230 - CM spoke with CM at [facility] and the facility does not have availability at this time but  will check to see if bed is opening this week for the patient.  [facility] - No beds available [facility] - left message with admissions   Expected Discharge Plan: Skilled Nursing Facility Barriers to Discharge: Continued Medical Work up (Plan for return to OR with Dr. Duda on 10/14- I&D)  Expected Discharge Plan and Services Expected Discharge Plan: Skilled Nursing Facility In-house Referral: Clinical Social Work Discharge Planning Services: CM Consult Post Acute Care Choice: Skilled Nursing Facility Living arrangements for the past 2 months: Single Family Home                                       Social Determinants of Health (SDOH) Interventions    Readmission Risk Interventions Readmission Risk Prevention Plan 07/15/2021 07/09/2021  Transportation Screening Complete Complete  Medication Review (RN Care Manager) Complete Complete  PCP or Specialist appointment within 3-5 days of discharge Complete Complete  HRI or Home Care Consult Complete Complete  SW Recovery Care/Counseling Consult Complete Complete  Palliative Care Screening Complete -  Skilled Nursing Facility Complete Complete  Some recent data might be hidden    

## 2021-07-15 NOTE — Progress Notes (Signed)
Physical Therapy Treatment Patient Details Name: Joseph [name]. MRN: [id number] DOB: 06-04-1956 Today's Date: 07/15/2021   History of Present Illness Joseph [name] is a 65 yo male who presnets with increased LLE swelling after recent fall at home. 06/11/21 pt fell when getting off x-ray table at hospital. X-rays showed a knee dislocation which was confirmed on CT scan as a posterior lateral dislocation; L knee reduced and L KI placed. Lumbar and pelvic xrays negative. 10/5: left knee dislocation reduction with external fixation placement and extensive debridement of hematoma and devitalized skin subtenons tissue muscle fascia Placement of wound VAC; Wound debridement on 10/12. 10/14 L knee and thigh debridement with skin graft, now WBAT L LE.  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    PT Comments    Pt anxious about mobility, states he had a bad night and his RLE is sore from WB during tilting yesterday. Pt wet with urine, required mod encouragement for bed mobility to clean up. Max +3 for rolling required. Pt adamantly refused tilting, worked in chair position for truncal rotations and returned to supine for RLE exercise. PT encouraged daily tilting with therapies, as pt has a long way to go with mobility before he will be appropriate for EOB/OOB without lift. PT to continue to follow.     Recommendations for follow up therapy are one component of a multi-disciplinary discharge planning process, led by the attending physician.  Recommendations may be updated based on patient status, additional functional criteria and insurance authorization.  Follow Up Recommendations  SNF     Equipment Recommendations  Hospital bed;Other (comment) (>500 lb capacity lift equipment, bariatric bed, bariatric WC >22x22" (may need 24" or larger))    Recommendations for Other Services       Precautions / Restrictions Precautions Precautions: Fall Precaution  Comments: RLE external fixation; wound vac; anxiety Restrictions LLE Weight Bearing: Weight bearing as tolerated     Mobility  Bed Mobility Overal bed mobility: Needs Assistance Bed Mobility: Rolling Rolling: Max assist;+2 for physical assistance         General bed mobility comments: max +3 for rolling bilat for changing wet linens under pt; cues for hand placement on bedrails, additional assist for moving LLE with exfix    Transfers                 General transfer comment: unable  Ambulation/Gait                 Stairs             Wheelchair Mobility    Modified Rankin (Stroke Patients Only)       Balance                                            Cognition Arousal/Alertness: Awake/alert Behavior During Therapy: Anxious Overall Cognitive Status: Within Functional Limits for tasks assessed                                 General Comments: requires mod encouragement to participate, limited by anxiety      Exercises General Exercises - Lower Extremity Heel Slides: Right;10 reps;Supine;AAROM (extension against mod PT resistance) Hip ABduction/ADduction: AAROM;Right;10 reps;Supine Other Exercises Other Exercises: semi-chair position: CL reaching x5 for truncal  rotation and strengthening    General Comments        Pertinent Vitals/Pain Pain Assessment: Faces Faces Pain Scale: Hurts even more Pain Location: LLE Pain Descriptors / Indicators: Grimacing;Sore;Discomfort Pain Intervention(s): Limited activity within patient's tolerance;Monitored during session;Repositioned    Home Living                      Prior Function            PT Goals (current goals can now be found in the care plan section) Acute Rehab PT Goals Patient Stated Goal: to help move as much as possible PT Goal Formulation: With patient/family Time For Goal Achievement: 07/20/21 Potential to Achieve Goals: Fair Progress  towards PT goals: Progressing toward goals    Frequency    Min 3X/week      PT Plan Current plan remains appropriate    Co-evaluation              AM-PAC PT "6 Clicks" Mobility   Outcome Measure  Help needed turning from your back to your side while in a flat bed without using bedrails?: Total Help needed moving from lying on your back to sitting on the side of a flat bed without using bedrails?: Total Help needed moving to and from a bed to a chair (including a wheelchair)?: Total Help needed standing up from a chair using your arms (e.g., wheelchair or bedside chair)?: Total Help needed to walk in hospital room?: Total Help needed climbing 3-5 steps with a railing? : Total 6 Click Score: 6    End of Session Equipment Utilized During Treatment: Oxygen (CPAP) Activity Tolerance: Patient limited by fatigue;Other (comment) (anxiety) Patient left: in bed;with call bell/phone within reach;with family/visitor present Nurse Communication: Need for lift equipment;Mobility status PT Visit Diagnosis: Other abnormalities of gait and mobility (R26.89);Muscle weakness (generalized) (M62.81);History of falling (Z91.81);Repeated falls (R29.6);Pain Pain - Right/Left: Left Pain - part of body: Knee;Leg     Time: 3329-5188 PT Time Calculation (min) (ACUTE ONLY): 29 min  Charges:  $Therapeutic Exercise: 8-22 mins $Therapeutic Activity: 8-22 mins                     Stacie Glaze, PT DPT Acute Rehabilitation Services Pager [phone]  Office [phone]    Roxine Caddy E Ruffin Pyo 07/15/2021, 5:36 PM

## 2021-07-15 NOTE — Plan of Care (Signed)

## 2021-07-15 NOTE — Progress Notes (Addendum)
[facility] PROGRESS NOTE  [name]. [id number]:[id number] DOB: 1956/01/27 DOA: 06/11/2021 PCP: Donald Prose, MD  Status: Remains inpatient appropriate because: Continued need for treatment of left knee wound, continues to have significant difficulty mobilizing secondary to morbid obesity and knee pain/wound; electrolyte abnormalities secondary to recurrent AKI; external fixator in place to LLE Anticipated disposition: Nursing facility for rehab: LTAC vs Inpt Rehab vs SNF From: Home Medically stable for discharge: No-although patient has no further planned operative procedures his degree of physical deconditioning is such that he is not yet appropriate for outpatient level of therapy such as would be provided at inpatient rehab or at a nursing facility.  Discussed with OT/PT and recommendation is that now he is able to bear weight on affected extremity we will begin a more aggressive therapy regimen utilizing our STAR program in hopes that patient will improve enough to transition to inpatient rehab although this remains to be seen.  Also considering LTAC Difficult to place: No Barriers to discharge: Morbid obesity  Level of care: Progressive   Code Status: Full Family Communication: Wife at bedside 10/18 DVT prophylaxis: Coumadin/IV heparin bridging COVID vaccination status: Unknown    HPI: 65 y.o. male with PMH significant for severe morbid obesity, OSA on CPAP, DM2, HTN, HLD, PAD, DVT on Coumadin, GERD, anxiety/depression, polyneuropathy, multiple falls who presented to the ED on 06/11/21 after fall at home resulting in left knee pain.  He was found to have left knee dislocation and fracture fragments with associated left knee effusion.  There were concern for possible left knee hematoma in setting of supratherapeutic INR.  Seen by orthopedic surgery, patient underwent closed reduction of left knee dislocation but no surgical management was planned because of patient's body habitus.   Patient is scheduled for a I&D on 07/08/2021 by Dr. Sharol Given.   Subjective: Awake.  Reporting fatigue related to yesterday's therapy sessions.  Patient encouraged that this is not unusual but that to get him to the point he can walk again he will need to push through the fatigue and do the best he can.  He agreed.  Objective: Vitals:   07/15/21 0504 07/15/21 0727  BP: (!) 171/53 (!) 152/54  Pulse: 79 81  Resp: 13 19  Temp: 98.3 F (36.8 C) 98.1 F (36.7 C)  SpO2: 94% 100%    Intake/Output Summary (Last 24 hours) at 07/15/2021 0824 Last data filed at 07/15/2021 0506 Gross per 24 hour  Intake 1473.54 ml  Output 700 ml  Net 773.54 ml   Filed Weights   06/25/21 0900 07/01/21 1518 07/08/21 0958  Weight: (!) 218 kg (!) 218 kg (!) 218 kg    Exam:  Constitutional: NAD, calm, but fatigued Respiratory: CTA bilaterally, Normal respiratory effort. No accessory muscle use.  CPAP in place, pulse oximetry 94 to 100% Cardiovascular: S1-S2, regular pulse with frequent PVCs and/18, chronic appearing bilateral lower extremity edema.  Normotensive. Abdomen: no tenderness,  Bowel sounds positive. LBM 10/18-some fecal incontinence  Musculoskeletal: External fixation device on left lower extremity, wound VAC in place to surgical wound.  Neurologic: CN 2-12 grossly intact. Sensation intact, DTR normal. Strength 5/5 x all 4 extremities.  Psychiatric: Normal judgment and insight. Alert and oriented x 3. Normal mood.    Assessment/Plan: Acute problems: Left knee dislocation status post left knee reduction external fixation with evacuation Left knee hematoma -Presented after fall.  X-ray w/ L knee dislocation and several fracture fragments/hematoma.  Orthopedic team initially recommendeded conservative therapy with nonweightbearing -MRI  obtained 9/30: significant ligamentous injury/lateral patellar dislocation.  Orthopedics team revisited surgical option and decided they would proceed with surgical  repair -s/p L knee extensive debridement, reduction of knee dislocation, external fixation spanning of knee dislocation w/ wound VAC application. -s/p I&D on Wednesday, 07/08/2021 and Friday, 07/10/21 - status but after most recent surgery he has transitioned to [facility] as of 10/17    06/11/2021                  06/18/2021     07/14/2021                 07/14/2021     History of pulmonary embolism/chronic anticoagulation with warfarin 10/17 there are no immediate surgical procedures planned so restarting warfarin with pharmacy dosing with IV heparin for bridging   Recurrent AKI with recurrent hyperkalemia Baseline creatinine  1.2 w/ GFR >60.  Creatinine as of 10/18 is 1.31 with a BUN of 73 and potassium 5.5 Previously received Lokelma for hyperkalemia Literature review in Up-To-Date states that the most common causes of persistent hyperkalemia is impaired urinary potassium excretion due to reduced secretion of renal response to aldosterone in addition acute on chronic kidney disease and/or effective arterial blood volume depletion can also cause persistent hyperkalemia. Initiate hypoaldosteronism evaluation: Check random serum cortisol and aldosterone levels Random cortisol normal, CK low at 17; aldosterone level and aldosterone renin activity have been obtained and are pending  Depression /anxiety -Patient was on Prozac prior to admission -Ativan PRN started in regards to therapy related anxiety -10/19 expressed feelings of hopelessness therefore will increase Prozac to 60 mg and have asked for chaplain consult  Profound physical deconditioning Patient was able to ambulate into the hospital but due to severity of injury requiring surgery as well as orthopedic recommendation for nonweightbearing during the initial portion of his hospitalization his ability to participate in therapies has been limited.  His significant elevation in BMI is also contributing to his ability to recover. 10/17  WBAT Continue bariatric Kreg tilt bed-this will be most helpful in assisting this gentleman regain mobility.  Increased risk of orthostasis with position changes including changing from supine to seated  Mobilize only with assistance Continue as needed Ativan for anxiety noting this is impacting his ability to effectively participate with therapy   Type 2 diabetes mellitus Diabetic neuropathy -A1c 6.1 on 9/22 -Currently on Actos, Semglee 50 units nightly with NovoLog 3 units 3 times daily and SSI   Essential hypertension BP is currently at goal Continue on amlodipine and lisinopril.   Hyperlipidemia Pravastatin on hold.   Chronic normocytic anemia Baseline hemoglobin ~10.   S/p of 2 units of PRBCs and 1 unit of FFP.   Preoperatively hemoglobin 7.6-follow CBC daily-current Hgb 8.6   Transient bradycardia/ 1st degree heart block/frequent PVCs Asymptomatic in context of known sleep apnea but occurs while awake.  Patient has had several runs of nonsustained V. tach lasting 5 beats in context of ill fitting BiPAP mask-tolerating CPAP at HS EKG significant for 1st degree AV block which is chronic.  Telemetry with heart rate down to high 30s while awake Cardiology plans Zio patch at discharge.       Severe morbid obesity  -Body mass index is 63.41 kg/m. Patient has been advised to make an attempt to improve diet and exercise patterns to aid in weight loss. PT recommended Kreg bed.   GERD -Continue Protonix-if renal function does not rapidly improve will need to hold until renal function normal  OSA, compliant with CPAP Has been compliant with CPAP, encouraged to continue. At home was on nocturnal CPAP but wearing more often during current hospitalization likely related to physical deconditioning and spending more time in bed as opposed to up walking around -also a component of anxiety contributing   Constipation, opiate induced. -in setting of opiate use. -Continue Colace,  Senokot, Miralax -Dulcolax suppository prn   Pressure injury -Mid-nose, not present on admission. Secondary to CPAP mask -10/17 begin foam dressing over bridge of nose     Data Reviewed: Basic Metabolic Panel: Recent Labs  Lab 07/11/21 0250 07/12/21 0245 07/13/21 0432 07/14/21 0336 07/15/21 0406  NA 133* 134* 134* 135 129*  K 5.6* 5.9* 5.6* 5.5* 5.0  CL 105 106 107 105 100  CO2 22 23 20* 24 22  GLUCOSE 196* 201* 135* 164* 199*  BUN 61* 73* 77* 73* 71*  CREATININE 1.55* 1.83* 1.59* 1.31* 1.36*  CALCIUM 8.6* 8.6* 8.6* 9.1 8.7*  PHOS  --   --   --  3.7 3.6   Liver Function Tests: Recent Labs  Lab 07/10/21 0115 07/13/21 0432 07/14/21 0336 07/15/21 0406  AST 12* 11*  --   --   ALT 5 <5  --   --   ALKPHOS 78 73  --   --   BILITOT 0.6 0.6  --   --   PROT 5.2* 5.4*  --   --   ALBUMIN 1.8* 1.9* 2.1* 2.0*    CBC: Recent Labs  Lab 07/10/21 0115 07/11/21 0250 07/12/21 0245 07/13/21 0432 07/14/21 0336 07/15/21 0406  WBC 6.9 6.9 7.3 6.5 6.2 6.5  NEUTROABS 4.3 4.2 4.3 3.8  --   --   HGB 7.6* 7.2* 7.0* 8.0* 8.6* 8.1*  HCT 25.4* 23.9* 23.8* 27.5* 28.9* 27.3*  MCV 97.3 95.6 95.6 95.5 94.1 94.5  PLT 267 295 315 317 358 383   CBG: Recent Labs  Lab 07/14/21 0755 07/14/21 1304 07/14/21 1551 07/14/21 2117 07/15/21 0725  GLUCAP 129* 115* 122* 172* 164*      Scheduled Meds:  (feeding supplement) PROSource Plus  30 mL Oral BID BM   amLODipine  10 mg Oral Daily   vitamin C  1,000 mg Oral Daily   cephALEXin  250 mg Oral QHS   docusate sodium  100 mg Oral Daily   feeding supplement (GLUCERNA SHAKE)  237 mL Oral Q24H   FLUoxetine  40 mg Oral QPM   insulin aspart  0-20 Units Subcutaneous TID WC   insulin aspart  0-5 Units Subcutaneous QHS   insulin aspart  3 Units Subcutaneous TID WC   insulin glargine-yfgn  55 Units Subcutaneous QHS   methocarbamol  1,000 mg Oral BID   multivitamin with minerals  1 tablet Oral Daily   nutrition supplement (JUVEN)  1 packet Oral  BID BM   pantoprazole  40 mg Oral Daily   pioglitazone  45 mg Oral Daily   polyethylene glycol  17 g Oral BID   Ensure Max Protein  11 oz Oral Daily   senna  1 tablet Oral Daily   tamsulosin  0.4 mg Oral Daily   Warfarin - Pharmacist Dosing Inpatient   Does not apply q1600   zinc sulfate  220 mg Oral Daily   Continuous Infusions:  sodium chloride 10 mL/hr at 07/13/21 1107    ceFAZolin (ANCEF) IV 2 g (07/15/21 0517)   heparin 2,900 Units/hr (07/15/21 0713)   magnesium sulfate bolus IVPB     methocarbamol (  ROBAXIN) IV      Principal Problem:   Left knee dislocation, initial encounter Active Problems:   Pure hypercholesterolemia   Essential hypertension, benign   OSA (obstructive sleep apnea)   Hx pulmonary embolism   Esophageal reflux   Diabetes mellitus type 2, uncontrolled   Diabetic neuropathy (HCC)   Acute renal failure superimposed on stage 3b chronic kidney disease (HCC)   Normocytic anemia   Class 3 obesity (HCC)   Aortic atherosclerosis (HCC)   Coronary atherosclerosis   Effusion of left knee   Left knee dislocation   Pressure injury of skin   Unstageable pressure ulcer of sacral region ([place])   At risk for hemorrhage associated with anticoagulation therapy   Ulcer of left knee, with necrosis of bone ([place])   Open knee wound, left, subsequent encounter   Consultants: Orthopedics Cardiology  Procedures: Echocardiogram Left knee reduction with external fixation and evacuation of hematoma Left thigh debridement on 10/12 and again on 10/14  Antibiotics: Cephalexin 9/15 through 10/4 Cefazolin 10/5 >>   Time spent: 35 minutes    Erin Hearing ANP  [facility] 7 am - 330 pm/M-F for direct patient care and secure chat Please refer to Amion for contact info 33  days

## 2021-07-15 NOTE — Progress Notes (Signed)
ANTICOAGULATION CONSULT NOTE - Follow Up Consult  Pharmacy Consult for Heparin/Warfarin Indication: h/o pulmonary embolus  Allergies  Allergen Reactions   Tape     Peels skin on legs   Chlorhexidine Rash    Wipes    Patient Measurements: Height: 6\' 1"  (185.4 cm) Weight: (!) 218 kg (480 lb 9.6 oz) IBW/kg (Calculated) : 79.9 Heparin Dosing Weight: 132.5 kg  Vital Signs: Temp: 98 F (36.7 C) (10/19 1146) Temp Source: Oral (10/19 1146) BP: 150/50 (10/19 1203) Pulse Rate: 79 (10/19 1203)  Labs: Recent Labs    07/13/21 0432 07/13/21 1304 07/14/21 0336 07/15/21 0406  HGB 8.0*  --  8.6* 8.1*  HCT 27.5*  --  28.9* 27.3*  PLT 317  --  358 383  LABPROT  --   --  16.9* 20.5*  INR  --   --  1.4* 1.8*  HEPARINUNFRC 0.40  --  0.31 0.34  CREATININE 1.59*  --  1.31* 1.36*  CKTOTAL  --  17*  --   --      Estimated Creatinine Clearance: 104.9 mL/min (A) (by C-G formula based on SCr of 1.36 mg/dL (H)).   Medications:  PTA Warfarin:  5 mg on Sun/Mon/Wed/Fri evening, and 7.5 mg on Tue/Thur/Sat evening.   INR was supratherapeutic (6.2) at admission  Assessment: 70 YOM on lifelong warfarin therapy for hx of PE, body habitus, and sedentary lifestyle admitted on 9/15 with left knee dislocation and effusion concerning for hematoma. At admission, warfarin was held, vitamin K was given for supratherapeutic INR (6.2), and pt was transfused.   Heparin level now therapeutic at 0.34 on gtt at 2900 units/hr. Hgb is 8.1.  Plt stable at 383k. No bleeding issues reported. INR up to 1.8 today.    Goal of Therapy:  Heparin level 0.3-0.7 units/ml Monitor platelets by anticoagulation protocol: Yes   Plan:  Continue heparin at 2900 units/hr Heparin level with AM labs Coumadin 7.5 mg po X1 today. Monitor daily CBC, INR, and s/sx bleeding   Albertina Parr, PharmD., BCPS, BCCCP Clinical Pharmacist Please refer to [facility] for unit-specific pharmacist

## 2021-07-16 DIAGNOSIS — S83105A Unspecified dislocation of left knee, initial encounter: Secondary | ICD-10-CM | POA: Diagnosis not present

## 2021-07-16 LAB — CBC
HCT: 25.8 % — ABNORMAL LOW (ref 39.0–52.0)
Hemoglobin: 7.6 g/dL — ABNORMAL LOW (ref 13.0–17.0)
MCH: 27.8 pg (ref 26.0–34.0)
MCHC: 29.5 g/dL — ABNORMAL LOW (ref 30.0–36.0)
MCV: 94.5 fL (ref 80.0–100.0)
Platelets: 396 10*3/uL (ref 150–400)
RBC: 2.73 MIL/uL — ABNORMAL LOW (ref 4.22–5.81)
RDW: 16.8 % — ABNORMAL HIGH (ref 11.5–15.5)
WBC: 7 10*3/uL (ref 4.0–10.5)
nRBC: 0 % (ref 0.0–0.2)

## 2021-07-16 LAB — RENAL FUNCTION PANEL
Albumin: 2 g/dL — ABNORMAL LOW (ref 3.5–5.0)
Anion gap: 9 (ref 5–15)
BUN: 77 mg/dL — ABNORMAL HIGH (ref 8–23)
CO2: 22 mmol/L (ref 22–32)
Calcium: 8.9 mg/dL (ref 8.9–10.3)
Chloride: 105 mmol/L (ref 98–111)
Creatinine, Ser: 1.25 mg/dL — ABNORMAL HIGH (ref 0.61–1.24)
GFR, Estimated: >60 mL/min (ref >60)
Glucose, Bld: 149 mg/dL — ABNORMAL HIGH (ref 70–99)
Phosphorus: 3.7 mg/dL (ref 2.5–4.6)
Potassium: 5.2 mmol/L — ABNORMAL HIGH (ref 3.5–5.1)
Sodium: 136 mmol/L (ref 135–145)

## 2021-07-16 LAB — GLUCOSE, CAPILLARY
Glucose-Capillary: 88 mg/dL (ref 70–99)
Glucose-Capillary: 88 mg/dL (ref 70–99)
Glucose-Capillary: 150 mg/dL — ABNORMAL HIGH (ref 70–99)
Glucose-Capillary: 187 mg/dL — ABNORMAL HIGH (ref 70–99)

## 2021-07-16 LAB — HEPARIN LEVEL (UNFRACTIONATED): Heparin Unfractionated: 0.29 IU/mL — ABNORMAL LOW (ref 0.30–0.70)

## 2021-07-16 LAB — PROTIME-INR
INR: 2.4 — ABNORMAL HIGH (ref 0.8–1.2)
Prothrombin Time: 26.5 seconds — ABNORMAL HIGH (ref 11.4–15.2)

## 2021-07-16 MED ORDER — WARFARIN SODIUM 3 MG PO TABS
3.0000 mg | ORAL_TABLET | Freq: Once | ORAL | Status: AC
  Administered 2021-07-16: 3 mg via ORAL
  Filled 2021-07-16: qty 1

## 2021-07-16 MED ORDER — FUROSEMIDE 10 MG/ML IJ SOLN
80.0000 mg | Freq: Once | INTRAMUSCULAR | Status: AC
  Administered 2021-07-16: 80 mg via INTRAVENOUS
  Filled 2021-07-16: qty 8

## 2021-07-16 MED ORDER — ALBUMIN HUMAN 25 % IV SOLN
12.5000 g | Freq: Four times a day (QID) | INTRAVENOUS | Status: AC
  Administered 2021-07-16 (×2): 12.5 g via INTRAVENOUS
  Filled 2021-07-16 (×2): qty 50

## 2021-07-16 NOTE — Progress Notes (Addendum)
[facility] PROGRESS NOTE  Joseph [name]. [id number] DOB: [birth year] DOA: 06/11/2021 PCP: Donald Prose, MD  Status: Remains inpatient appropriate because:  Continued need for treatment of left knee wound, continues to have significant difficulty mobilizing secondary to morbid obesity and knee pain/wound; electrolyte abnormalities secondary to recurrent AKI; external fixator in place to LLE  Medically stable for discharge:  No-although patient has no further planned operative procedures his degree of physical deconditioning is such that he is not yet appropriate for outpatient level of therapy such as would be provided at inpatient rehab or at a nursing facility.  Discussed with OT/PT and recommendation is that now he is able to bear weight on affected extremity we will begin a more aggressive therapy regimen utilizing our STAR program in hopes that patient will improve enough to transition to inpatient rehab although this remains to be seen.  Also considering LTAC  Barriers to discharge:  Morbid obesity  Level of care:  Progressive   Code Status: Full Family Communication: Wife at bedside 10/20 DVT prophylaxis: Coumadin/IV heparin bridging COVID vaccination status: Unknown    HPI: 65 y.o. male with PMH significant for severe morbid obesity, OSA on CPAP, DM2, HTN, HLD, PAD, DVT on Coumadin, GERD, anxiety/depression, polyneuropathy, multiple falls who presented to the ED on 06/11/21 after fall at home resulting in left knee pain.  He was found to have left knee dislocation and fracture fragments with associated left knee effusion.  There were concern for possible left knee hematoma in setting of supratherapeutic INR.  Seen by orthopedic surgery, patient underwent closed reduction of left knee dislocation but no surgical management was planned because of patient's body habitus.  Patient is scheduled for a I&D on 07/08/2021 by Dr. Sharol Given.   Subjective: Patient awake and alert.   Having discomfort in scrotum secondary to increased swelling as well as irritation from urinary incontinence due to device not fitting adequately.  Objective: Vitals:   07/16/21 0005 07/16/21 0319  BP: (!) 154/41 (!) 156/52  Pulse: 83 82  Resp: 20 20  Temp: 98.7 F (37.1 C) 98 F (36.7 C)  SpO2: 96% 95%    Intake/Output Summary (Last 24 hours) at 07/16/2021 0821 Last data filed at 07/16/2021 0321 Gross per 24 hour  Intake 1405.39 ml  Output 900 ml  Net 505.39 ml   Filed Weights   06/25/21 0900 07/01/21 1518 07/08/21 0958  Weight: (!) 218 kg (!) 218 kg (!) 218 kg    Exam:  Constitutional: NAD, calm Respiratory: CTA bilaterally, Normal respiratory effort. No accessory muscle use.  CPAP in place, pulse oximetry 94 to 100% Cardiovascular: S1-S2, regular pulse with frequent PVCs; chronic appearing bilateral lower extremity edema.  Normotensive.  Also now with scrotal edema. Abdomen: no tenderness,  Bowel sounds positive. LBM 10/18-some fecal incontinence  Musculoskeletal: External fixation device on left lower extremity, wound VAC in place to surgical wound.  Neurologic: CN 2-12 grossly intact. Sensation intact, DTR normal. Strength 5/5 x all 4 extremities.  Psychiatric: Normal judgment and insight. Alert and oriented x 3. Normal mood.    Assessment/Plan: Acute problems: Left knee dislocation status post left knee reduction external fixation with evacuation Left knee hematoma -Presented after fall.  X-ray w/ L knee dislocation and several fracture fragments/hematoma.  Orthopedic team initially recommendeded conservative therapy with nonweightbearing -MRI obtained 9/30: significant ligamentous injury/lateral patellar dislocation.  Orthopedics team revisited surgical option and decided they would proceed with surgical repair -s/p L knee extensive debridement, reduction of knee  dislocation, external fixation spanning of knee dislocation w/ wound VAC application. -s/p I&D on  Wednesday, 07/08/2021 and Friday, 07/10/21 - status but after most recent surgery he has transitioned to [facility] as of 10/17    06/11/2021                  06/18/2021     07/14/2021                 07/14/2021     History of pulmonary embolism/chronic anticoagulation with warfarin Continue warfarin-INR 2.4-pharmacist has discontinued heparin   Recurrent AKI with recurrent hyperkalemia Baseline creatinine  1.2 w/ GFR >60.  Creatinine as of 10/18 is 1.31 with a BUN of 73 and potassium 5.5 Previously received Lokelma for hyperkalemia Random cortisol normal, CK low at 17; aldosterone level and aldosterone renin activity have been obtained and are pending  Chronic peripheral edema Given past issues with worsening renal function after Lasix plan is to give albumin followed by Lasix 80 mg IV followed by another bolus of albumin 5 liters positive  Depression /anxiety -Patient was on Prozac prior to admission-dose increased to 60 mg on 10/19 -Continue Ativan prn for ongoing anxiety especially anxiety with therapy -Patient encouraged to overcome fears regarding mobility and ambulation in order to progress to inpatient rehabilitation setting  Profound physical deconditioning Patient was able to ambulate into the hospital but due to severity of injury requiring surgery as well as orthopedic recommendation for nonweightbearing during the initial portion of his hospitalization his ability to participate in therapies has been limited.  His significant elevation in BMI is also contributing to his ability to recover. 10/17 WBAT Continue bariatric Kreg tilt bed-this will be most helpful in assisting this gentleman regain mobility.  Increased risk of orthostasis with position changes including changing from supine to seated  Continue prn Ativan for anxiety noting this is impacting his ability to effectively participate with therapy   Type 2 diabetes mellitus Diabetic neuropathy -A1c 6.1 on 9/22 -Currently  on Actos, Semglee 50 units nightly with NovoLog 3 units  TID AC and SSI   Essential hypertension BP is currently at goal Continue on amlodipine and lisinopril.   Hyperlipidemia Pravastatin on hold.   Chronic normocytic anemia Baseline hemoglobin ~10.   S/p of 2 units of PRBCs and 1 unit of FFP.   Preoperatively hemoglobin 7.6-follow CBC daily-current Hgb 7.6   Transient bradycardia/ 1st degree heart block/frequent PVCs Asymptomatic in context of known sleep apnea but occurs while awake.  EKG significant for 1st degree AV block which is chronic.  Telemetry with heart rate down to high 30s while awake Cardiology plans Zio patch at discharge.       Severe morbid obesity  -Body mass index is 63.41 kg/m. Patient has been advised to make an attempt to improve diet and exercise patterns to aid in weight loss. PT recommended Kreg bed.   GERD -Continue Protonix-if renal function does not rapidly improve will need to hold until renal function normal   OSA, compliant with CPAP Has been compliant with CPAP, encouraged to continue. Continue CPAP HS and during the day until physical deconditioning improved   Constipation, opiate induced -Continue Colace, Senokot, Miralax -Dulcolax suppository prn   Pressure injury -Mid-nose, not present on admission. Secondary to CPAP mask -10/17 begin foam dressing over bridge of nose     Data Reviewed: Basic Metabolic Panel: Recent Labs  Lab 07/12/21 0245 07/13/21 0432 07/14/21 0336 07/15/21 0406 07/16/21 0401  NA 134* 134*  135 129* 136  K 5.9* 5.6* 5.5* 5.0 5.2*  CL 106 107 105 100 105  CO2 23 20* 24 22 22   GLUCOSE 201* 135* 164* 199* 149*  BUN 73* 77* 73* 71* 77*  CREATININE 1.83* 1.59* 1.31* 1.36* 1.25*  CALCIUM 8.6* 8.6* 9.1 8.7* 8.9  PHOS  --   --  3.7 3.6 3.7   Liver Function Tests: Recent Labs  Lab 07/10/21 0115 07/13/21 0432 07/14/21 0336 07/15/21 0406 07/16/21 0401  AST 12* 11*  --   --   --   ALT 5 <5  --   --   --    ALKPHOS 78 73  --   --   --   BILITOT 0.6 0.6  --   --   --   PROT 5.2* 5.4*  --   --   --   ALBUMIN 1.8* 1.9* 2.1* 2.0* 2.0*    CBC: Recent Labs  Lab 07/10/21 0115 07/11/21 0250 07/12/21 0245 07/13/21 0432 07/14/21 0336 07/15/21 0406 07/16/21 0401  WBC 6.9 6.9 7.3 6.5 6.2 6.5 7.0  NEUTROABS 4.3 4.2 4.3 3.8  --   --   --   HGB 7.6* 7.2* 7.0* 8.0* 8.6* 8.1* 7.6*  HCT 25.4* 23.9* 23.8* 27.5* 28.9* 27.3* 25.8*  MCV 97.3 95.6 95.6 95.5 94.1 94.5 94.5  PLT 267 295 315 317 358 383 396   CBG: Recent Labs  Lab 07/15/21 0725 07/15/21 1143 07/15/21 1539 07/15/21 2112 07/16/21 0745  GLUCAP 164* 157* 176* 186* 88      Scheduled Meds:  (feeding supplement) PROSource Plus  30 mL Oral BID BM   amLODipine  10 mg Oral Daily   vitamin C  1,000 mg Oral Daily   cephALEXin  250 mg Oral QHS   docusate sodium  100 mg Oral Daily   feeding supplement (GLUCERNA SHAKE)  237 mL Oral Q24H   FLUoxetine  60 mg Oral QPM   insulin aspart  0-20 Units Subcutaneous TID WC   insulin aspart  0-5 Units Subcutaneous QHS   insulin aspart  3 Units Subcutaneous TID WC   insulin glargine-yfgn  55 Units Subcutaneous QHS   methocarbamol  1,000 mg Oral BID   multivitamin with minerals  1 tablet Oral Daily   nutrition supplement (JUVEN)  1 packet Oral BID BM   pantoprazole  40 mg Oral Daily   pioglitazone  45 mg Oral Daily   polyethylene glycol  17 g Oral BID   Ensure Max Protein  11 oz Oral Daily   senna  1 tablet Oral Daily   tamsulosin  0.4 mg Oral Daily   Warfarin - Pharmacist Dosing Inpatient   Does not apply q1600   zinc sulfate  220 mg Oral Daily   Continuous Infusions:  sodium chloride 10 mL/hr at 07/13/21 1107   magnesium sulfate bolus IVPB     methocarbamol (ROBAXIN) IV      Principal Problem:   Left knee dislocation, initial encounter Active Problems:   Pure hypercholesterolemia   Essential hypertension, benign   OSA (obstructive sleep apnea)   Hx pulmonary embolism   Esophageal  reflux   Diabetes mellitus type 2, uncontrolled   Diabetic neuropathy (HCC)   Acute renal failure superimposed on stage 3b chronic kidney disease (HCC)   Normocytic anemia   Class 3 obesity (HCC)   Aortic atherosclerosis (HCC)   Coronary atherosclerosis   Effusion of left knee   Left knee dislocation   Pressure injury of skin  Unstageable pressure ulcer of sacral region ([place])   At risk for hemorrhage associated with anticoagulation therapy   Ulcer of left knee, with necrosis of bone ([place])   Open knee wound, left, subsequent encounter   Consultants: Orthopedics Cardiology  Procedures: Echocardiogram Left knee reduction with external fixation and evacuation of hematoma Left thigh debridement on 10/12 and again on 10/14  Antibiotics: Cephalexin 9/15 through 10/4 Cefazolin 10/5 >>   Time spent: 35 minutes    Erin Hearing ANP  [facility] 7 am - 330 pm/M-F for direct patient care and secure chat Please refer to Amion for contact info 34  days

## 2021-07-16 NOTE — Progress Notes (Signed)
ANTICOAGULATION CONSULT NOTE - Follow Up Consult  Pharmacy Consult for Warfarin Indication: h/o pulmonary embolus  Allergies  Allergen Reactions   Tape     Peels skin on legs   Chlorhexidine Rash    Wipes    Patient Measurements: Height: 6\' 1"  (185.4 cm) Weight: (!) 218 kg (480 lb 9.6 oz) IBW/kg (Calculated) : 79.9 Heparin Dosing Weight: 132.5 kg  Vital Signs: Temp: 98 F (36.7 C) (10/20 0319) Temp Source: Oral (10/20 0319) BP: 156/52 (10/20 0319) Pulse Rate: 82 (10/20 0319)  Labs: Recent Labs    07/13/21 1304 07/14/21 0336 07/14/21 0336 07/15/21 0406 07/16/21 0401  HGB  --  8.6*   < > 8.1* 7.6*  HCT  --  28.9*  --  27.3* 25.8*  PLT  --  358  --  383 396  LABPROT  --  16.9*  --  20.5* 26.5*  INR  --  1.4*  --  1.8* 2.4*  HEPARINUNFRC  --  0.31  --  0.34 0.29*  CREATININE  --  1.31*  --  1.36* 1.25*  CKTOTAL 17*  --   --   --   --    < > = values in this interval not displayed.     Estimated Creatinine Clearance: 114.1 mL/min (A) (by C-G formula based on SCr of 1.25 mg/dL (H)).   Medications:  PTA Warfarin:  5 mg on Sun/Mon/Wed/Fri evening, and 7.5 mg on Tue/Thur/Sat evening.   INR was supratherapeutic (6.2) at admission  Assessment: 37 YOM on lifelong warfarin therapy for hx of PE, body habitus, and sedentary lifestyle admitted on 9/15 with left knee dislocation and effusion concerning for hematoma. At admission, warfarin was held, vitamin K was given for supratherapeutic INR (6.2), and pt was transfused.   INR is therapeutic at 2.4 today and bridged with heparin infusion at 2900 units/hr. H/H down, Plt wnl. SCr improved.    Goal of Therapy:  INR 2-3 Monitor platelets by anticoagulation protocol: Yes   Plan:  Stop IV heparin bridge since INR > 2  Coumadin 3 mg po X 1 today. Monitor daily INR   Albertina Parr, PharmD., BCPS, BCCCP Clinical Pharmacist Please refer to [facility] for unit-specific pharmacist

## 2021-07-16 NOTE — Progress Notes (Signed)
Patient has newly presented swelling in his genital area. Purewick was unable to catch urine due to swelling last night so area is now tender. This RN and NT assisted patient with new male Clayton. looks to be working now. MD notified. Will continue to modify.   Shelbie Proctor, RN

## 2021-07-16 NOTE — Progress Notes (Signed)
Patient ID: Joseph [name]., male   DOB: 1956-06-05, 65 y.o.   MRN: [id number] Patient is 6 days status post application of skin graft to the left knee as well as external fixation.  There is 150 cc in the wound VAC canister.  Anticipate discharge when patient is safe with mobilization.  At this point patient states he does not have enough strength in his right leg to even attempt weightbearing on the right lower extremity.  He states he has less strength in the left lower extremity.

## 2021-07-16 NOTE — Progress Notes (Signed)
Occupational Therapy Treatment Patient Details Name: Joseph [name]. MRN: [id number] DOB: 1955/10/28 Today's Date: 07/16/2021   History of present illness Joseph [name] is a 65 yo male who presnets with increased LLE swelling after recent fall at home. 06/11/21 pt fell when getting off x-ray table at hospital. X-rays showed a knee dislocation which was confirmed on CT scan as a posterior lateral dislocation; L knee reduced and L KI placed. Lumbar and pelvic xrays negative. 10/5: left knee dislocation reduction with external fixation placement and extensive debridement of hematoma and devitalized skin subtenons tissue muscle fascia Placement of wound VAC; Wound debridement on 10/12. 10/14 L knee and thigh debridement with skin graft, now WBAT L LE.  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   OT comments  Pt initially asking for "a day off". Honest discussion with pt/pt's wife regarding importance of participating with OT/PT when they are scheduled and how non-participation will negatively impact his outcome in addition to doing the exercises/activities as instructed by OT/PT at other times during the day, which the pt has not done. Also discussed importance of weaning himself off the CPAP except when he is sleeping and working on increasing his [facility] throughout the day. Wife/nsg made aware. Able to tolerate 20 degrees for 20 min; 30 degrees for 7 min, then began to complain of nausea. Difficulty attaining reliable BP. Pillow case placed under pannus to wick away moisture - note MASD. Will continue to follow acutely.    Recommendations for follow up therapy are one component of a multi-disciplinary discharge planning process, led by the attending physician.  Recommendations may be updated based on patient status, additional functional criteria and insurance authorization.    Follow Up Recommendations  SNF    Equipment Recommendations  3 in 1 bedside commode     Recommendations for Other Services      Precautions / Restrictions Precautions Precautions: Fall Precaution Comments: RLE external fixation; wound vac; anxiety       Mobility Bed Mobility               General bed mobility comments: Used Trendelenburg and pt pulling on top handrails to scoot up in bed. Pt initiating using RLE to help push    Transfers                 General transfer comment: tilt to 30 degrees    Balance                                           ADL either performed or assessed with clinical judgement   ADL Overall ADL's : Needs assistance/impaired     Grooming: Set up   Upper Body Bathing: Minimal assistance;Bed level     Lower Body Bathing Details (indicate cue type and reason): Pannus lifted and pillow case placed to wick away moisture; Max A to lift pannus                     Functional mobility during ADLs:  (tilt bed) General ADL Comments: encouraged pt to complete as much of his ADL as he can     Vision       Perception     Praxis      Cognition Arousal/Alertness: Awake/alert Behavior During Therapy: Anxious Overall Cognitive Status: Within Functional Limits for tasks assessed  Exercises General Exercises - Lower Extremity Gluteal Sets: 20 reps;Standing (at 24 degrees) Heel Slides: Right;15 reps;Supine Mini-Sqauts: 10 reps (at 24 degrees) Other Exercises Other Exercises: counterpressure exercises @ 24-30 degrees - pushing against B rails   Shoulder Instructions       General Comments      Pertinent Vitals/ Pain       Pain Assessment: Faces Faces Pain Scale: Hurts even more Pain Location: LLE Pain Descriptors / Indicators: Grimacing;Sore;Discomfort;Moaning;Sharp Pain Intervention(s): Limited activity within patient's tolerance;Repositioned;Premedicated before session;Relaxation  Home Living                                           Prior Functioning/Environment              Frequency  Min 2X/week        Progress Toward Goals  OT Goals(current goals can now be found in the care plan section)  Progress towards OT goals: Progressing toward goals  Acute Rehab OT Goals Patient Stated Goal: to help move as much as possible OT Goal Formulation: With patient/family Time For Goal Achievement: 07/16/21 ADL Goals Pt Will Perform Upper Body Bathing: bed level Pt/caregiver will Perform Home Exercise Program: Increased strength;Both right and left upper extremity;With theraband Additional ADL Goal #1: Ptwill roll R/L with 1 person min A in preparation for ADL tasks Additional ADL Goal #2: Pt will tolerate standing in Tilt bed x 15 min at 45 degrees to improve stadning tolerance for ADL andmoblity Additional ADL Goal #3: Pt will verbalize 2 strategies to reduce risk of MASD under pannus  Plan Discharge plan needs to be updated    Co-evaluation                 AM-PAC OT "6 Clicks" Daily Activity     Outcome Measure   Help from another person eating meals?: None Help from another person taking care of personal grooming?: A Little Help from another person toileting, which includes using toliet, bedpan, or urinal?: Total Help from another person bathing (including washing, rinsing, drying)?: A Lot Help from another person to put on and taking off regular upper body clothing?: A Lot Help from another person to put on and taking off regular lower body clothing?: Total 6 Click Score: 13    End of Session Equipment Utilized During Treatment: Oxygen (CPAP)  OT Visit Diagnosis: Unsteadiness on feet (R26.81);Other abnormalities of gait and mobility (R26.89);Muscle weakness (generalized) (M62.81);History of falling (Z91.81);Pain Pain - Right/Left: Left Pain - part of body: Leg   Activity Tolerance Patient limited by fatigue;Patient limited by pain;Other (comment) (anxiety)    Patient Left in bed;with call bell/phone within reach;with family/visitor present   Nurse Communication Mobility status        Time: 1005-1058 OT Time Calculation (min): 53 min  Charges: OT General Charges $OT Visit: 1 Visit OT Treatments $Self Care/Home Management : 8-22 mins $Therapeutic Activity: 23-37 mins $Therapeutic Exercise: 8-22 mins  Maurie Boettcher, OT/L   Acute OT Clinical Specialist [place] Pager [phone] Office [phone]   [facility] 07/16/2021, 11:13 AM

## 2021-07-16 NOTE — Progress Notes (Signed)
   07/16/21 1641  Clinical Encounter Type  Visited With Patient and family together  Visit Type Spiritual support  Referral From Nurse  Spiritual Encounters  Spiritual Needs Emotional;Prayer  Stress Factors  Patient Stress Factors Health changes   Pt stated feeling better after a medication assisting with his mood.  Pt shared about strong family ties with grandchildren.  Pt asked for prayer involving his body doing what is needed to heal.  Chaplain Marylene Land Page: [phone]

## 2021-07-17 DIAGNOSIS — F331 Major depressive disorder, recurrent, moderate: Secondary | ICD-10-CM

## 2021-07-17 DIAGNOSIS — S83105A Unspecified dislocation of left knee, initial encounter: Secondary | ICD-10-CM | POA: Diagnosis not present

## 2021-07-17 LAB — CBC
HCT: 27.5 % — ABNORMAL LOW (ref 39.0–52.0)
Hemoglobin: 8.2 g/dL — ABNORMAL LOW (ref 13.0–17.0)
MCH: 28.1 pg (ref 26.0–34.0)
MCHC: 29.8 g/dL — ABNORMAL LOW (ref 30.0–36.0)
MCV: 94.2 fL (ref 80.0–100.0)
Platelets: 423 10*3/uL — ABNORMAL HIGH (ref 150–400)
RBC: 2.92 MIL/uL — ABNORMAL LOW (ref 4.22–5.81)
RDW: 16.5 % — ABNORMAL HIGH (ref 11.5–15.5)
WBC: 7.4 10*3/uL (ref 4.0–10.5)
nRBC: 0 % (ref 0.0–0.2)

## 2021-07-17 LAB — BLOOD GAS, ARTERIAL
Acid-base deficit: 0.2 mmol/L (ref 0.0–2.0)
Bicarbonate: 24.3 mmol/L (ref 20.0–28.0)
Drawn by: 519031
FIO2: 52
O2 Saturation: 91.1 %
Patient temperature: 37.1
pCO2 arterial: 42.9 mmHg (ref 32.0–48.0)
pH, Arterial: 7.372 (ref 7.350–7.450)
pO2, Arterial: 60.2 mmHg — ABNORMAL LOW (ref 83.0–108.0)

## 2021-07-17 LAB — RENAL FUNCTION PANEL
Albumin: 2.3 g/dL — ABNORMAL LOW (ref 3.5–5.0)
Anion gap: 6 (ref 5–15)
BUN: 80 mg/dL — ABNORMAL HIGH (ref 8–23)
CO2: 24 mmol/L (ref 22–32)
Calcium: 9.1 mg/dL (ref 8.9–10.3)
Chloride: 106 mmol/L (ref 98–111)
Creatinine, Ser: 1.35 mg/dL — ABNORMAL HIGH (ref 0.61–1.24)
GFR, Estimated: 59 mL/min — ABNORMAL LOW (ref >60)
Glucose, Bld: 172 mg/dL — ABNORMAL HIGH (ref 70–99)
Phosphorus: 4.2 mg/dL (ref 2.5–4.6)
Potassium: 5.5 mmol/L — ABNORMAL HIGH (ref 3.5–5.1)
Sodium: 136 mmol/L (ref 135–145)

## 2021-07-17 LAB — URINALYSIS, ROUTINE W REFLEX MICROSCOPIC
Bilirubin Urine: NEGATIVE
Glucose, UA: NEGATIVE mg/dL
Hgb urine dipstick: NEGATIVE
Ketones, ur: NEGATIVE mg/dL
Leukocytes,Ua: NEGATIVE
Nitrite: NEGATIVE
Protein, ur: 30 mg/dL — AB
Specific Gravity, Urine: 1.012 (ref 1.005–1.030)
pH: 5 (ref 5.0–8.0)

## 2021-07-17 LAB — GLUCOSE, CAPILLARY
Glucose-Capillary: 149 mg/dL — ABNORMAL HIGH (ref 70–99)
Glucose-Capillary: 136 mg/dL — ABNORMAL HIGH (ref 70–99)
Glucose-Capillary: 89 mg/dL (ref 70–99)
Glucose-Capillary: 141 mg/dL — ABNORMAL HIGH (ref 70–99)

## 2021-07-17 LAB — CK: Total CK: 16 U/L — ABNORMAL LOW (ref 49–397)

## 2021-07-17 LAB — PROTIME-INR
INR: 2.6 — ABNORMAL HIGH (ref 0.8–1.2)
Prothrombin Time: 28.1 seconds — ABNORMAL HIGH (ref 11.4–15.2)

## 2021-07-17 MED ORDER — OXYCODONE HCL 5 MG PO TABS
5.0000 mg | ORAL_TABLET | Freq: Four times a day (QID) | ORAL | Status: DC | PRN
Start: 2021-07-17 — End: 2021-07-17

## 2021-07-17 MED ORDER — OXYCODONE HCL 5 MG PO TABS: 10.0000 mg | ORAL_TABLET | ORAL | Status: DC

## 2021-07-17 MED ORDER — ALBUMIN HUMAN 25 % IV SOLN
12.5000 g | Freq: Four times a day (QID) | INTRAVENOUS | Status: AC
  Administered 2021-07-17 (×2): 12.5 g via INTRAVENOUS
  Filled 2021-07-17 (×2): qty 50

## 2021-07-17 MED ORDER — FUROSEMIDE 10 MG/ML IJ SOLN
80.0000 mg | Freq: Once | INTRAMUSCULAR | Status: AC
  Administered 2021-07-17: 80 mg via INTRAVENOUS
  Filled 2021-07-17: qty 8

## 2021-07-17 MED ORDER — WARFARIN SODIUM 5 MG PO TABS
5.0000 mg | ORAL_TABLET | Freq: Once | ORAL | Status: AC
  Administered 2021-07-17: 5 mg via ORAL
  Filled 2021-07-17: qty 1

## 2021-07-17 MED ORDER — OXYCODONE HCL 5 MG PO TABS
5.0000 mg | ORAL_TABLET | ORAL | Status: DC | PRN
  Administered 2021-07-17 – 2021-07-29 (×26): 5 mg via ORAL
  Filled 2021-07-17 (×31): qty 1

## 2021-07-17 MED ORDER — LORAZEPAM 0.5 MG PO TABS: 0.2500 mg | ORAL_TABLET | Freq: Once | ORAL | Status: DC | PRN

## 2021-07-17 MED ORDER — SODIUM ZIRCONIUM CYCLOSILICATE 5 G PO PACK
5.0000 g | PACK | Freq: Every day | ORAL | Status: DC
  Filled 2021-07-17: qty 1

## 2021-07-17 MED ORDER — CHLORTHALIDONE 50 MG PO TABS
50.0000 mg | ORAL_TABLET | Freq: Every day | ORAL | Status: DC
  Administered 2021-07-18 – 2021-07-20 (×3): 50 mg via ORAL
  Filled 2021-07-17 (×3): qty 1

## 2021-07-17 MED ORDER — HYDROMORPHONE HCL 1 MG/ML IJ SOLN
0.5000 mg | INTRAMUSCULAR | Status: DC | PRN
  Administered 2021-07-17 – 2021-07-29 (×39): 0.5 mg via INTRAVENOUS
  Filled 2021-07-17 (×42): qty 0.5

## 2021-07-17 MED ORDER — OXYCODONE HCL 5 MG PO TABS
10.0000 mg | ORAL_TABLET | ORAL | Status: DC
  Administered 2021-07-19 – 2021-07-26 (×8): 10 mg via ORAL
  Filled 2021-07-17 (×9): qty 2

## 2021-07-17 NOTE — Consult Note (Signed)
[facility] Consult   Reason for Consult:  Depression Referring Physician:  Erin Hearing, NP Patient Identification: Joseph [name]. MRN:  [id number] Principal Diagnosis: Left knee dislocation, initial encounter Diagnosis:  Principal Problem:   Left knee dislocation, initial encounter Active Problems:   Pure hypercholesterolemia   Essential hypertension, benign   OSA (obstructive sleep apnea)   Hx pulmonary embolism   Esophageal reflux   Diabetes mellitus type 2, uncontrolled   Diabetic neuropathy (HCC)   Acute renal failure superimposed on stage 3b chronic kidney disease (HCC)   Normocytic anemia   Class 3 obesity (HCC)   Aortic atherosclerosis (HCC)   Coronary atherosclerosis   Effusion of left knee   Left knee dislocation   Pressure injury of skin   Unstageable pressure ulcer of sacral region ([place])   At risk for hemorrhage associated with anticoagulation therapy   Ulcer of left knee, with necrosis of bone ([place])   Open knee wound, left, subsequent encounter  Assessment  Joseph [name]. is a 65 y.o. male admitted medically f9/15/2022  1:10 PM for fall w/ knee dislocation w/ complicated subsequent medical treatment. 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   He meets criteria for depression vs adjustment disorder based on initial assessment.   Outpatient psychotropic medication include Prozac 40mg  and historically he has had a good response to these medications. He was compliant with medications prior to admission as evidenced by patient's wife who endorses compliance . On initial examination, patient is very drowsy and endorses that hsi recent fall has left him traumatized and with an  overall negative outlook and concerned about his future. We plan to decrease sedating medications.  Patient endorses that his recent fall and subsequent physical trauma has left him feeling very down and concerned. Patient endorses decreased appetite, trouble sleeping and feelings of overall hopelessness. Patient does endorse that he has been attempting to participate in his therapy, but has been having more trouble staying awake the past 2 days. Patient was witnessed having trouble staying awake for assessment today, despite this he did well on MMSE questions assessing concentration (world backwards, Days of the week backwards, similar items and identifying objects.). Patient was also appropriately concerned about his increasing drowsiness. At this time patient's immediate dysphoric mood and anxiety appear to be directly related to his recent medical problems.   Adjustment disorder w/ depression and anxiety Hx of MDD - Continue Prozac 60mg  daily, for mood - Decrease Ativan to 0.25mg  Once PRN daily for anxiety prior to PT, would like to avoid oversedation  Thank you for this consult at this time we will sign off.   Total Time spent with patient: 30 minutes  Subjective:   Joseph [name]. is a 65 y.o. male patient admitted with knee dislocation after fall.   HPI:  On assessment today patient reports that his mood has been very low since his fall. Patient endorses that he is not sleeping or eating well and is over all concerned about "the outcome" related to his fall. Patient endorses symptoms of feeling hopeless. Patient reports that he has also become more tired over the past 2 days and is struggling to stay awake. Patient reports that he has been attempting PT but is over all id identifying  this current health predicament as a trauma. Patient denies SI, HI and AVH.   Patient reports that prior to his fall he was dx with Depression many years ago and has been taking Prozac and feels that the  medication had been beneficial. Patient reports that he has not seen a psychiatrist since at least 2007 and his medication is currently prescribed by his PCP.   Patient wife was also in the room. Wife reports that patient had been compliant with his Prozac and that the medication seemed to benefit the patient.     Risk to Self:   Risk to Others:   Prior Inpatient Therapy:   Prior Outpatient Therapy:    Past Medical History:  Past Medical History:  Diagnosis Date   Anxiety state, unspecified    Aortic atherosclerosis ([place]) 06/12/2021   Depression    DVT (deep venous thrombosis) ([place]) 2004   Left knee   Edema    Esophageal reflux    occ   Essential hypertension, benign    Essential hypertension, benign    Falls frequently    History of iron deficiency anemia    Morbid obesity (HCC)    Myalgia and myositis, unspecified    BACK AND LEGS   OSA on CPAP    Peripheral vascular disease, unspecified ([place])    Personal history of PE (pulmonary embolism) 2004   Polyneuropathy in diabetes(357.2)    Poor venous access    PT STATES DIFFICULT TO DRAW BLOOD FROM HIS VEINS   Pure hypercholesterolemia    Shortness of breath    DUE TO WEIGHT AND LOSS OF MOBILITY   Type II or unspecified type diabetes mellitus with neurological manifestations, not stated as uncontrolled(250.60)    Type II or unspecified type diabetes mellitus without mention of complication, not stated as uncontrolled    Type II or unspecified type diabetes mellitus without mention of complication, uncontrolled    Varicose veins of lower extremities with inflammation    Wears glasses    Wound infection after surgery    required hospitalization    Past Surgical History:  Procedure Laterality Date   AMPUTATION TOE Bilateral 04/03/2020   Procedure: BILATERAL HALLUX AMPUTATION;  Surgeon: Felipa Furnace, DPM;  Location: [facility];  Service: Podiatry;  Laterality: Bilateral;   COLONOSCOPY WITH PROPOFOL N/A 07/20/2019   Procedure:  COLONOSCOPY WITH PROPOFOL;  Surgeon: Wonda Horner, MD;  [facility];  Service: Endoscopy;  Laterality: N/A;   HEMATOMA EVACUATION Left 07/01/2021   Procedure: LEFT KNEE REDUCTION EXTERNAL FIXATION WITH EVACUATION HEMATOMA;  Surgeon: Meredith Pel, MD;  [facility];  Service: Orthopedics;  Laterality: Left;   I & D EXTREMITY Bilateral 04/05/2020   Procedure: IRRIGATION AND DEBRIDEMENT EXTREMITY WITH CLOSURE - BILATERALLY;  Surgeon: Felipa Furnace, DPM;  [facility];  Service: Podiatry;  Laterality: Bilateral;   I & D EXTREMITY Left 07/08/2021   Procedure: LEFT THIGH DEBRIDEMENT;  Surgeon: Newt Minion, MD;  Location: [facility];  Service: Orthopedics;  Laterality: Left;   I & D EXTREMITY Left 07/10/2021   Procedure: LEFT THIGH DEBRIDEMENT;  Surgeon: Newt Minion, MD;  Location: [facility];  Service: Orthopedics;  Laterality: Left;   NO PAST SURGERIES     PANNICULECTOMY N/A 07/15/2014   Procedure: PANNICULECTOMY;  Surgeon: Pedro Earls, MD;  [facility];  Service: General;  Laterality: N/A;   POLYPECTOMY  07/20/2019   Procedure: POLYPECTOMY;  Surgeon: Wonda Horner, MD;  Location[facility];  Service: Endoscopy;;   SKIN SPLIT GRAFT Left 07/10/2021   Procedure: APPLY SKIN GRAFT;  Surgeon: Newt Minion, MD;  Location: [facility];  Service: Orthopedics;  Laterality: Left;   Family History:  Family History  Problem Relation Age of Onset   Hypertension Father    Pulmonary embolism Father    Hypertension Other    Social History:  Social History   Substance and Sexual Activity  Alcohol Use Not Currently     Social History   Substance and Sexual Activity  Drug Use No    Social History   Socioeconomic History   Marital status: Married    Spouse name: Not on file   Number of children: Not on file   Years of education: Not on file   Highest education level: Not on file  Occupational History   Not on file  Tobacco Use   Smoking status: Never   Smokeless  tobacco: Never  Vaping Use   Vaping Use: Never used  Substance and Sexual Activity   Alcohol use: Not Currently   Drug use: No   Sexual activity: Not on file  Other Topics Concern   Not on file  Social History Narrative   Not on file   Social Determinants of Health   Financial Resource Strain: Not on file  Food Insecurity: Not on file  Transportation Needs: Not on file  Physical Activity: Not on file  Stress: Not on file  Social Connections: Not on file   Additional Social History:    Allergies:   Allergies  Allergen Reactions   Tape     Peels skin on legs   Chlorhexidine Rash    Wipes    Labs:  Results for orders placed or performed during the hospital encounter of 06/11/21 (from the past 48 hour(s))  Glucose, capillary     Status: Abnormal   Collection Time: 07/15/21  3:39 PM  Result Value Ref Range   Glucose-Capillary 176 (H) 70 - 99 mg/dL    Comment: Glucose reference range applies only to samples taken after fasting for at least 8 hours.  Glucose, capillary     Status: Abnormal   Collection Time: 07/15/21  9:12 PM  Result Value Ref Range   Glucose-Capillary 186 (H) 70 - 99 mg/dL    Comment: Glucose reference range applies only to samples taken after fasting for at least 8 hours.  Protime-INR     Status: Abnormal   Collection Time: 07/16/21  4:01 AM  Result Value Ref Range   Prothrombin Time 26.5 (H) 11.4 - 15.2 seconds   INR 2.4 (H) 0.8 - 1.2    Comment: (NOTE) INR goal varies based on device and disease states. Performed at [facility], [place] [address] [place], [place] [address]   Renal function panel     Status: Abnormal   Collection Time: 07/16/21  4:01 AM  Result Value Ref Range   Sodium 136 135 - 145 mmol/L   Potassium 5.2 (H) 3.5 - 5.1 mmol/L   Chloride 105 98 - 111 mmol/L   CO2 22 22 - 32 mmol/L   Glucose, Bld 149 (H) 70 - 99 mg/dL    Comment: Glucose reference range applies only to samples taken after fasting for at least 8 hours.    BUN 77 (H) 8 - 23 mg/dL   Creatinine, Ser 1.25 (H) 0.61 - 1.24 mg/dL   Calcium 8.9 8.9 - 10.3 mg/dL   Phosphorus 3.7 2.5 - 4.6  mg/dL   Albumin 2.0 (L) 3.5 - 5.0 g/dL   GFR, Estimated >60 >60 mL/min    Comment: (NOTE) Calculated using the CKD-EPI Creatinine Equation (2021)    Anion gap 9 5 - 15    Comment: Performed at [facility] [address] [place], [place] [address]  CBC     Status: Abnormal   Collection Time: 07/16/21  4:01 AM  Result Value Ref Range   WBC 7.0 4.0 - 10.5 K/uL   RBC 2.73 (L) 4.22 - 5.81 MIL/uL   Hemoglobin 7.6 (L) 13.0 - 17.0 g/dL   HCT 25.8 (L) 39.0 - 52.0 %   MCV 94.5 80.0 - 100.0 fL   MCH 27.8 26.0 - 34.0 pg   MCHC 29.5 (L) 30.0 - 36.0 g/dL   RDW 16.8 (H) 11.5 - 15.5 %   Platelets 396 150 - 400 K/uL   nRBC 0.0 0.0 - 0.2 %    Comment: Performed at [facility], [place] [address], [place], [place] [address]  Heparin level (unfractionated)     Status: Abnormal   Collection Time: 07/16/21  4:01 AM  Result Value Ref Range   Heparin Unfractionated 0.29 (L) 0.30 - 0.70 IU/mL    Comment: (NOTE) The clinical reportable range upper limit is being lowered to >1.10 to align with the FDA approved guidance for the current laboratory assay.  If heparin results are below expected values, and patient dosage has  been confirmed, suggest follow up testing of antithrombin III levels. Performed at [facility], [place] [address], [place], [place] [address]   Glucose, capillary     Status: None   Collection Time: 07/16/21  7:45 AM  Result Value Ref Range   Glucose-Capillary 88 70 - 99 mg/dL    Comment: Glucose reference range applies only to samples taken after fasting for at least 8 hours.  Glucose, capillary     Status: None   Collection Time: 07/16/21 11:54 AM  Result Value Ref Range   Glucose-Capillary 88 70 - 99 mg/dL    Comment: Glucose reference range applies only to samples taken after fasting for at least 8 hours.  Glucose, capillary      Status: Abnormal   Collection Time: 07/16/21  4:55 PM  Result Value Ref Range   Glucose-Capillary 150 (H) 70 - 99 mg/dL    Comment: Glucose reference range applies only to samples taken after fasting for at least 8 hours.  Glucose, capillary     Status: Abnormal   Collection Time: 07/16/21  9:46 PM  Result Value Ref Range   Glucose-Capillary 187 (H) 70 - 99 mg/dL    Comment: Glucose reference range applies only to samples taken after fasting for at least 8 hours.  Protime-INR     Status: Abnormal   Collection Time: 07/17/21  3:43 AM  Result Value Ref Range   Prothrombin Time 28.1 (H) 11.4 - 15.2 seconds   INR 2.6 (H) 0.8 - 1.2    Comment: (NOTE) INR goal varies based on device and disease states. Performed at [facility], [place] [address] [place], [place] [address]   Renal function panel     Status: Abnormal   Collection Time: 07/17/21  3:43 AM  Result Value Ref Range   Sodium 136 135 - 145 mmol/L   Potassium 5.5 (H) 3.5 - 5.1 mmol/L   Chloride 106 98 - 111 mmol/L   CO2 24 22 - 32 mmol/L   Glucose,  Bld 172 (H) 70 - 99 mg/dL    Comment: Glucose reference range applies only to samples taken after fasting for at least 8 hours.   BUN 80 (H) 8 - 23 mg/dL   Creatinine, Ser 1.35 (H) 0.61 - 1.24 mg/dL   Calcium 9.1 8.9 - 10.3 mg/dL   Phosphorus 4.2 2.5 - 4.6 mg/dL   Albumin 2.3 (L) 3.5 - 5.0 g/dL   GFR, Estimated 59 (L) >60 mL/min    Comment: (NOTE) Calculated using the CKD-EPI Creatinine Equation (2021)    Anion gap 6 5 - 15    Comment: Performed at [facility] [address] [place], [place] [address]  CBC     Status: Abnormal   Collection Time: 07/17/21  3:43 AM  Result Value Ref Range   WBC 7.4 4.0 - 10.5 K/uL   RBC 2.92 (L) 4.22 - 5.81 MIL/uL   Hemoglobin 8.2 (L) 13.0 - 17.0 g/dL   HCT 27.5 (L) 39.0 - 52.0 %   MCV 94.2 80.0 - 100.0 fL   MCH 28.1 26.0 - 34.0 pg   MCHC 29.8 (L) 30.0 - 36.0 g/dL   RDW 16.5 (H) 11.5 - 15.5 %   Platelets 423 (H) 150 - 400  K/uL   nRBC 0.0 0.0 - 0.2 %    Comment: Performed at [facility] [address], [place], [place] [address]  Glucose, capillary     Status: Abnormal   Collection Time: 07/17/21  7:37 AM  Result Value Ref Range   Glucose-Capillary 149 (H) 70 - 99 mg/dL    Comment: Glucose reference range applies only to samples taken after fasting for at least 8 hours.  Glucose, capillary     Status: Abnormal   Collection Time: 07/17/21 11:12 AM  Result Value Ref Range   Glucose-Capillary 136 (H) 70 - 99 mg/dL    Comment: Glucose reference range applies only to samples taken after fasting for at least 8 hours.  Blood gas, arterial     Status: Abnormal   Collection Time: 07/17/21 11:25 AM  Result Value Ref Range   FIO2 52.00    pH, Arterial 7.372 7.350 - 7.450   pCO2 arterial 42.9 32.0 - 48.0 mmHg   pO2, Arterial 60.2 (L) 83.0 - 108.0 mmHg   Bicarbonate 24.3 20.0 - 28.0 mmol/L   Acid-base deficit 0.2 0.0 - 2.0 mmol/L   O2 Saturation 91.1 %   Patient temperature 37.1    Collection site RIGHT RADIAL    Drawn by [id number]    Sample type ARTERIAL DRAW    Allens test (pass/fail) PASS PASS    Comment: Performed at [facility], [place] [address], [place], [place] [address]    Current Facility-Administered Medications  Medication Dose Route Frequency Provider Last Rate Last Admin   (feeding supplement) PROSource Plus liquid 30 mL  30 mL Oral BID BM Newt Minion, MD   30 mL at 07/17/21 1205   0.9 %  sodium chloride infusion   Intravenous Continuous Samella Parr, NP 10 mL/hr at 07/13/21 1107 Rate Change at 07/13/21 1107   acetaminophen (TYLENOL) tablet 1,000 mg  1,000 mg Oral Q6H PRN Newt Minion, MD   1,000 mg at 07/14/21 1300   Or   acetaminophen (TYLENOL) suppository 650 mg  650 mg Rectal Q4H PRN Newt Minion, MD       albumin human 25 % solution 12.5 g  12.5 g Intravenous Q6H Samella Parr,  NP 60 mL/hr at 07/17/21 1115 12.5 g at 07/17/21 1115   alum & mag hydroxide-simeth  (MAALOX/MYLANTA) 200-200-20 MG/5ML suspension 15-30 mL  15-30 mL Oral Q2H PRN Newt Minion, MD       amLODipine (NORVASC) tablet 10 mg  10 mg Oral Daily Newt Minion, MD   10 mg at 07/17/21 7619   ascorbic acid (VITAMIN C) tablet 1,000 mg  1,000 mg Oral Daily Newt Minion, MD   1,000 mg at 07/17/21 5093   bisacodyl (DULCOLAX) EC tablet 5 mg  5 mg Oral Daily PRN Newt Minion, MD       bisacodyl (DULCOLAX) suppository 10 mg  10 mg Rectal Daily PRN Newt Minion, MD       cephALEXin (KEFLEX) capsule 250 mg  250 mg Oral QHS Reome, Earle J, RPH   250 mg at 07/16/21 2201   [START ON 07/18/2021] chlorthalidone (HYGROTON) tablet 50 mg  50 mg Oral Daily Samella Parr, NP       docusate sodium (COLACE) capsule 100 mg  100 mg Oral Daily Newt Minion, MD   100 mg at 07/17/21 0906   feeding supplement (GLUCERNA SHAKE) (GLUCERNA SHAKE) liquid 237 mL  237 mL Oral Q24H Newt Minion, MD   237 mL at 07/17/21 0915   FLUoxetine (PROZAC) capsule 60 mg  60 mg Oral QPM Samella Parr, NP   60 mg at 07/16/21 1621   guaiFENesin-dextromethorphan (ROBITUSSIN DM) 100-10 MG/5ML syrup 15 mL  15 mL Oral Q4H PRN Newt Minion, MD       hydrALAZINE (APRESOLINE) injection 5 mg  5 mg Intravenous Q20 Min PRN Newt Minion, MD       HYDROmorphone (DILAUDID) injection 0.5 mg  0.5 mg Intravenous Q4H PRN Samella Parr, NP       insulin aspart (novoLOG) injection 0-20 Units  0-20 Units Subcutaneous TID WC Newt Minion, MD   3 Units at 07/17/21 1205   insulin aspart (novoLOG) injection 0-5 Units  0-5 Units Subcutaneous QHS Newt Minion, MD   2 Units at 07/11/21 2157   insulin aspart (novoLOG) injection 3 Units  3 Units Subcutaneous TID WC Newt Minion, MD   3 Units at 07/17/21 1205   insulin glargine-yfgn (SEMGLEE) injection 55 Units  55 Units Subcutaneous QHS Newt Minion, MD   55 Units at 07/16/21 2200   labetalol (NORMODYNE) injection 10 mg  10 mg Intravenous Q10 min PRN Newt Minion, MD       LORazepam  (ATIVAN) tablet 0.25 mg  0.25 mg Oral Once PRN Damita Dunnings B, MD       magnesium sulfate IVPB 2 g 50 mL  2 g Intravenous Daily PRN Newt Minion, MD       methocarbamol (ROBAXIN) tablet 500 mg  500 mg Oral Q6H PRN Newt Minion, MD   500 mg at 07/14/21 1909   Or   methocarbamol (ROBAXIN) 500 mg in dextrose 5 % 50 mL IVPB  500 mg Intravenous Q6H PRN Newt Minion, MD       methocarbamol (ROBAXIN) tablet 1,000 mg  1,000 mg Oral BID Newt Minion, MD   1,000 mg at 07/17/21 0904   metoCLOPramide (REGLAN) tablet 5-10 mg  5-10 mg Oral Q8H PRN Newt Minion, MD       Or   metoCLOPramide (REGLAN) injection 5-10 mg  5-10 mg Intravenous Q8H PRN Newt Minion, MD  10 mg at 07/11/21 1228   metoprolol tartrate (LOPRESSOR) injection 2-5 mg  2-5 mg Intravenous Q2H PRN Newt Minion, MD       multivitamin with minerals tablet 1 tablet  1 tablet Oral Daily Newt Minion, MD   1 tablet at 07/17/21 8891   nutrition supplement (JUVEN) (JUVEN) powder packet 1 packet  1 packet Oral BID BM Newt Minion, MD   1 packet at 07/17/21 0908   ondansetron (ZOFRAN) injection 4 mg  4 mg Intravenous Q6H PRN Newt Minion, MD       ondansetron [facility]) tablet 4 mg  4 mg Oral Q6H PRN Newt Minion, MD       oxyCODONE (Oxy IR/ROXICODONE) immediate release tablet 5 mg  5 mg Oral Q6H PRN Samella Parr, NP       And   [START ON 07/18/2021] oxyCODONE (Oxy IR/ROXICODONE) immediate release tablet 10 mg  10 mg Oral Daily Samella Parr, NP       pantoprazole (PROTONIX) EC tablet 40 mg  40 mg Oral Daily Newt Minion, MD   40 mg at 07/17/21 0905   phenol (CHLORASEPTIC) mouth spray 1 spray  1 spray Mouth/Throat PRN Newt Minion, MD       pioglitazone (ACTOS) tablet 45 mg  45 mg Oral Daily Newt Minion, MD   45 mg at 07/17/21 6945   polyethylene glycol (MIRALAX / GLYCOLAX) packet 17 g  17 g Oral BID Newt Minion, MD   17 g at 07/16/21 2159   polyethylene glycol (MIRALAX / GLYCOLAX) packet 17 g  17 g Oral Daily PRN  Newt Minion, MD       protein supplement (ENSURE MAX) liquid  11 oz Oral Daily Newt Minion, MD   11 oz at 07/16/21 1622   senna (SENOKOT) tablet 8.6 mg  1 tablet Oral Daily Newt Minion, MD   8.6 mg at 07/17/21 0388   silver sulfADIAZINE (SILVADENE) 1 % cream 1 application  1 application Topical Daily PRN Newt Minion, MD   1 application at 82/80/03 0900   tamsulosin (FLOMAX) capsule 0.4 mg  0.4 mg Oral Daily Newt Minion, MD   0.4 mg at 07/17/21 4917   vitamin A & D ointment   Topical PRN Nita Sells, MD   1 application at 91/50/56 1500   warfarin (COUMADIN) tablet 5 mg  5 mg Oral ONCE-1600 [place], Darnell Level, [facility]       Warfarin - Pharmacist Dosing Inpatient   Does not apply [id number] Lavenia Atlas, [facility]   Given at 07/16/21 1624   zinc sulfate capsule 220 mg  220 mg Oral Daily Newt Minion, MD   220 mg at 07/17/21 0165     Psychiatric Specialty Exam:  Presentation  General Appearance: Appropriate for Environment  Eye Contact:Fair  Speech:Slow  Speech Volume:Decreased  Handedness: No data recorded  Mood and Affect  Mood:Dysphoric  Affect:Congruent   Thought Process  Thought Processes:Coherent  Descriptions of Associations:Intact  Orientation:Full (Time, Place and Person)  Thought Content:Logical  History of Schizophrenia/Schizoaffective disorder:No data recorded Duration of Psychotic Symptoms:No data recorded Hallucinations:Hallucinations: None  Ideas of Reference:None  Suicidal Thoughts:Suicidal Thoughts: No  Homicidal Thoughts:Homicidal Thoughts: No   Sensorium  Memory:Immediate Good; Recent Good; Remote Good  Judgment:Fair  Insight:Present   Executive Functions  Concentration:Fair  Attention Span:Poor  Recall: No data recorded Fund of Knowledge:Good  Language:Good   Psychomotor Activity  Psychomotor Activity:Psychomotor  Activity: Decreased   Assets  Assets:Desire for Improvement; Social Support; Resilience;  Housing   Sleep  Sleep:Sleep: Poor   Physical Exam: Physical Exam Constitutional:      Comments: Drowsy but easily arousable to verbal stimuli, requires arousing multiple times throughout assessment.   HENT:     Head: Normocephalic and atraumatic.  Pulmonary:     Comments: On CPAP Skin:    General: Skin is dry.  Neurological:     Mental Status: He is oriented to person, place, and time.   Review of Systems  Constitutional:  Positive for malaise/fatigue.       Decreased appetite  Psychiatric/Behavioral:  Positive for depression. Negative for suicidal ideas.   Blood pressure (!) 136/39, pulse 89, temperature 98.8 F (37.1 C), temperature source Oral, resp. rate 19, height 6\' 1"  (1.854 m), weight (!) 218 kg, SpO2 95 %. Body mass index is 63.41 kg/m.  PGY-2 Freida Busman, MD 07/17/2021 1:04 PM

## 2021-07-17 NOTE — Progress Notes (Signed)
ANTICOAGULATION CONSULT NOTE - Follow Up Consult  Pharmacy Consult for Warfarin Indication: h/o pulmonary embolus  Allergies  Allergen Reactions   Tape     Peels skin on legs   Chlorhexidine Rash    Wipes    Patient Measurements: Height: 6\' 1"  (185.4 cm) Weight: (!) 218 kg (480 lb 9.6 oz) IBW/kg (Calculated) : 79.9 Heparin Dosing Weight: 132.5 kg  Vital Signs: Temp: 97.9 F (36.6 C) (10/21 0739) Temp Source: Oral (10/21 0739) BP: 152/41 (10/21 0739) Pulse Rate: 92 (10/21 0739)  Labs: Recent Labs    07/15/21 0406 07/16/21 0401 07/17/21 0343  HGB 8.1* 7.6* 8.2*  HCT 27.3* 25.8* 27.5*  PLT 383 396 423*  LABPROT 20.5* 26.5* 28.1*  INR 1.8* 2.4* 2.6*  HEPARINUNFRC 0.34 0.29*  --   CREATININE 1.36* 1.25* 1.35*     Estimated Creatinine Clearance: 105.6 mL/min (A) (by C-G formula based on SCr of 1.35 mg/dL (H)).   Medications:  PTA Warfarin:  5 mg on Sun/Mon/Wed/Fri evening, and 7.5 mg on Tue/Thur/Sat evening.   INR was supratherapeutic (6.2) at admission  Assessment: 68 YOM on lifelong warfarin therapy for hx of PE, body habitus, and sedentary lifestyle admitted on 9/15 with left knee dislocation and effusion concerning for hematoma. At admission, warfarin was held, vitamin K was given for supratherapeutic INR (6.2), and pt was transfused.   Currently back on warfarin. INR today remains therapeutic at 2.6. H/H improved. Plt elevated    Goal of Therapy:  INR 2-3 Monitor platelets by anticoagulation protocol: Yes   Plan:  Warfarin 5 mg x 1 dose today  Monitor daily CBC, INR, and s/sx bleeding   Albertina Parr, PharmD., BCPS, BCCCP Clinical Pharmacist Please refer to [facility] for unit-specific pharmacist

## 2021-07-17 NOTE — Progress Notes (Addendum)
[facility] PROGRESS NOTE  Joseph [name]. SAY:[id number] DOB: [birth year] DOA: 06/11/2021 PCP: Donald Prose, MD  Status: Remains inpatient appropriate because:  Continued need for treatment of left knee wound, continues to have significant difficulty mobilizing secondary to morbid obesity and knee pain/wound; electrolyte abnormalities secondary to recurrent AKI; external fixator in place to LLE  Medically stable for discharge:  No-although patient has no further planned operative procedures his degree of physical deconditioning is such that he is not yet appropriate for outpatient level of therapy such as would be provided at inpatient rehab or at a nursing facility.  Discussed with OT/PT and recommendation is that now he is able to bear weight on affected extremity we will begin a more aggressive therapy regimen utilizing our [facility] program in hopes that patient will improve enough to transition to inpatient rehab although this remains to be seen.  Also considering LTAC  Barriers to discharge:  Morbid obesity  Level of care:  Progressive   Code Status: Full Family Communication: Wife at bedside 10/21 early a.m. then return to unit on same date to further update wife and daughter who is now at the bedside regarding current plan of care and results of recent ABG. DVT prophylaxis: Coumadin/IV heparin bridging COVID vaccination status: Unknown    HPI: 65 y.o. male with PMH significant for severe morbid obesity, OSA on CPAP, DM2, HTN, HLD, PAD, DVT on Coumadin, GERD, anxiety/depression, polyneuropathy, multiple falls who presented to the ED on 06/11/21 after fall at home resulting in left knee pain.  He was found to have left knee dislocation and fracture fragments with associated left knee effusion.  There were concern for possible left knee hematoma in setting of supratherapeutic INR.  Seen by orthopedic surgery, patient underwent closed reduction of left knee dislocation but no surgical  management was planned because of patient's body habitus.  Patient is scheduled for a I&D on 07/08/2021 by Dr. Sharol Given.   Subjective: Patient without any specific complaints.  More drowsy than usual and likely due to long-acting effects of Ativan noting psychiatry team has changed the dosing.  Family very concerned over his drowsiness and I reassured them that he is remaining hemodynamically stable, his O2 sats are stable And he did not have any elevation in CO2 readings when I checked the ABG today.  We also discussed the possibility of changing to low-dose OxyContin dose Oxy IR for breakthrough pain after his current excessive sedation symptoms have resolved.  I explained that typically people who are requiring frequent dosing of Oxy IR will have erratic levels of pain medications and that leads to inadequate pain control and he would likely do better on the longer acting medication.  Objective: Vitals:   07/17/21 0406 07/17/21 0739  BP: (!) 153/69 (!) 152/41  Pulse: 86 92  Resp: 20 20  Temp: 98.3 F (36.8 C) 97.9 F (36.6 C)  SpO2: 94% 93%    Intake/Output Summary (Last 24 hours) at 07/17/2021 0842 Last data filed at 07/17/2021 0407 Gross per 24 hour  Intake 50 ml  Output 1450 ml  Net -1400 ml   Filed Weights   06/25/21 0900 07/01/21 1518 07/08/21 0958  Weight: (!) 218 kg (!) 218 kg (!) 218 kg    Exam:  Constitutional: NAD, calm [name] today Respiratory: CTA bilaterally, Normal respiratory effort. No accessory muscle use.  CPAP in place, pulse oximetry 97 to 100% Cardiovascular.  S1-S2, irregular pulse with frequent PVCs normotensive.  Soft edema right lower extremity with  evidence of more chronic edema on right lower extremity and calf size has increased as well but is soft and remains tender.  Also now with scrotal edema. Abdomen: no tenderness,  Bowel sounds positive. LBM 10/20-some fecal incontinence  Musculoskeletal: External fixation device on left lower extremity, wound VAC  in place to surgical wound.  Neurologic: CN 2-12 grossly intact. Sensation intact, DTR normal. Strength 5/5 x all 4 extremities.  Psychiatric: Normal judgment and insight. Alert and oriented x 3. Normal mood.    Assessment/Plan: Acute problems: Left knee dislocation status post left knee reduction external fixation with evacuation Left knee hematoma -Presented after fall.  X-ray w/ L knee dislocation and several fracture fragments/hematoma.  Orthopedic team initially recommendeded conservative therapy with nonweightbearing -MRI obtained 9/30: significant ligamentous injury/lateral patellar dislocation.  Orthopedics team revisited surgical option and decided they would proceed with surgical repair -s/p L knee extensive debridement, reduction of knee dislocation, external fixation spanning of knee dislocation w/ wound VAC application. -s/p I&D on Wednesday, 07/08/2021 and Friday, 07/10/21 - status but after most recent surgery he has transitioned to [facility] as of 10/17 -Continues to have difficulty with pain management is contributing to his anxiety with physical therapy.  Discussed with family that once current episode of lethargy which is presumed related to Ativan resolves we will consider changing to longer acting OxyContin with Oxy IR for breakthrough pain and discontinuation of IV Dilaudid. -Given lethargy I have opted to decrease his Oxy IR from 20 mg every 4 hours to 15 mg every 4 hours and will reevaluate as above on Monday 10/24    06/11/2021                  06/18/2021     07/14/2021                 07/14/2021     History of pulmonary embolism/chronic anticoagulation with warfarin Continue warfarin-INR therapeutic   Recurrent AKI with recurrent hyperkalemia Baseline creatinine  1.2 w/ GFR >60.  Creatinine as of 10/18 is 1.31 with a BUN of 73 and potassium 5.5-potassium did decrease to as low as 5.0 stable renal function Previously received Lokelma for hyperkalemia-discussed with  nephrology Dr. Joelyn Oms.  Given patient's current clinical status that he considers current potassium level normobulimic and with only treat elevated potassium with Lokelma if greater than 6.0 nephrology recommendation is to utilize chlorthalidone to help with electrolyte stabilization and renal excretion of potassium Random cortisol normal, CK low at 17; aldosterone level and aldosterone renin activity have been obtained and are pending  Chronic peripheral edema Given past issues with worsening renal function after Lasix plan is to give albumin followed by Lasix 80 mg IV followed by another bolus of albumin 5 liters positive 10/21 no significant increase in urine output with combination of albumin and Lasix.  BUN increased slightly to 80 and creatinine increased slightly to 1.35.  Increased slightly to 5.5 from 5.2 Nephrology recommends utilizing chlorthalidone and not Lasix.  First dose will be on 10/22  Depression /anxiety -Patient was on Prozac prior to admission-dose increased to 60 mg on 10/19 -Currently started on as needed Ativan for significant anxiety especially with therapy sessions.  Asked for psych consultation and they suspect Ativan is contributing to his lethargy so they have changed to a much lower dose of 0.25 mg as opposed to 0.5 mg and this is only to be given prior to physical therapy sessions. -Patient encouraged to overcome fears regarding mobility and ambulation  in order to progress to inpatient rehabilitation setting  Profound physical deconditioning Patient was able to ambulate into the hospital but due to severity of injury requiring surgery as well as orthopedic recommendation for nonweightbearing during the initial portion of his hospitalization his ability to participate in therapies has been limited.  His significant elevation in BMI is also contributing to his ability to recover. 10/17 WBAT Continue bariatric Kreg tilt bed-this will be most helpful in assisting this  gentleman regain mobility.  Increased risk of orthostasis with position changes including changing from supine to seated  Continue prn Ativan for anxiety noting this is impacting his ability to effectively participate with therapy   Type 2 diabetes mellitus Diabetic neuropathy -A1c 6.1 on 9/22 -Currently on Actos, Semglee 50 units nightly with NovoLog 3 units  TID AC and SSI   Essential hypertension BP is currently at goal Continue on amlodipine and lisinopril.   Hyperlipidemia Pravastatin on hold.   Chronic normocytic anemia Baseline hemoglobin ~10.   S/p of 2 units of PRBCs and 1 unit of FFP.   Preoperatively hemoglobin 7.6-follow CBC daily-current Hgb 7.6   Transient bradycardia/ 1st degree heart block/frequent PVCs Asymptomatic in context of known sleep apnea but occurs while awake.  EKG significant for 1st degree AV block which is chronic.  Telemetry with heart rate down to high 30s while awake Cardiology plans Zio patch at discharge.       Severe morbid obesity  -Body mass index is 63.41 kg/m. Patient has been advised to make an attempt to improve diet and exercise patterns to aid in weight loss. PT recommended Kreg bed.   GERD -Continue Protonix-if renal function does not rapidly improve will need to hold until renal function normal   OSA, compliant with CPAP Has been compliant with CPAP, encouraged to continue. Continue CPAP HS and during the day until physical deconditioning improved 10/21 ABG without evidence of hypercarbia in context of increased lethargy   Constipation, opiate induced -Continue Colace, Senokot, Miralax -Dulcolax suppository prn   Pressure injury -Mid-nose, not present on admission. Secondary to CPAP mask -10/17 begin foam dressing over bridge of nose     Data Reviewed: Basic Metabolic Panel: Recent Labs  Lab 07/13/21 0432 07/14/21 0336 07/15/21 0406 07/16/21 0401 07/17/21 0343  NA 134* 135 129* 136 136  K 5.6* 5.5* 5.0 5.2* 5.5*   CL 107 105 100 105 106  CO2 20* 24 22 22 24   GLUCOSE 135* 164* 199* 149* 172*  BUN 77* 73* 71* 77* 80*  CREATININE 1.59* 1.31* 1.36* 1.25* 1.35*  CALCIUM 8.6* 9.1 8.7* 8.9 9.1  PHOS  --  3.7 3.6 3.7 4.2   Liver Function Tests: Recent Labs  Lab 07/13/21 0432 07/14/21 0336 07/15/21 0406 07/16/21 0401 07/17/21 0343  AST 11*  --   --   --   --   ALT <5  --   --   --   --   ALKPHOS 73  --   --   --   --   BILITOT 0.6  --   --   --   --   PROT 5.4*  --   --   --   --   ALBUMIN 1.9* 2.1* 2.0* 2.0* 2.3*    CBC: Recent Labs  Lab 07/11/21 0250 07/12/21 0245 07/13/21 0432 07/14/21 0336 07/15/21 0406 07/16/21 0401 07/17/21 0343  WBC 6.9 7.3 6.5 6.2 6.5 7.0 7.4  NEUTROABS 4.2 4.3 3.8  --   --   --   --  HGB 7.2* 7.0* 8.0* 8.6* 8.1* 7.6* 8.2*  HCT 23.9* 23.8* 27.5* 28.9* 27.3* 25.8* 27.5*  MCV 95.6 95.6 95.5 94.1 94.5 94.5 94.2  PLT 295 315 317 358 383 396 423*   CBG: Recent Labs  Lab 07/16/21 0745 07/16/21 1154 07/16/21 1655 07/16/21 2146 07/17/21 0737  GLUCAP 88 88 150* 187* 149*      Scheduled Meds:  (feeding supplement) PROSource Plus  30 mL Oral BID BM   amLODipine  10 mg Oral Daily   vitamin C  1,000 mg Oral Daily   cephALEXin  250 mg Oral QHS   docusate sodium  100 mg Oral Daily   feeding supplement (GLUCERNA SHAKE)  237 mL Oral Q24H   FLUoxetine  60 mg Oral QPM   insulin aspart  0-20 Units Subcutaneous TID WC   insulin aspart  0-5 Units Subcutaneous QHS   insulin aspart  3 Units Subcutaneous TID WC   insulin glargine-yfgn  55 Units Subcutaneous QHS   methocarbamol  1,000 mg Oral BID   multivitamin with minerals  1 tablet Oral Daily   nutrition supplement (JUVEN)  1 packet Oral BID BM   pantoprazole  40 mg Oral Daily   pioglitazone  45 mg Oral Daily   polyethylene glycol  17 g Oral BID   Ensure Max Protein  11 oz Oral Daily   senna  1 tablet Oral Daily   tamsulosin  0.4 mg Oral Daily   Warfarin - Pharmacist Dosing Inpatient   Does not apply q1600    zinc sulfate  220 mg Oral Daily   Continuous Infusions:  sodium chloride 10 mL/hr at 07/13/21 1107   magnesium sulfate bolus IVPB     methocarbamol (ROBAXIN) IV      Principal Problem:   Left knee dislocation, initial encounter Active Problems:   Pure hypercholesterolemia   Essential hypertension, benign   OSA (obstructive sleep apnea)   Hx pulmonary embolism   Esophageal reflux   Diabetes mellitus type 2, uncontrolled   Diabetic neuropathy (HCC)   Acute renal failure superimposed on stage 3b chronic kidney disease (HCC)   Normocytic anemia   Class 3 obesity (HCC)   Aortic atherosclerosis (HCC)   Coronary atherosclerosis   Effusion of left knee   Left knee dislocation   Pressure injury of skin   Unstageable pressure ulcer of sacral region ([place])   At risk for hemorrhage associated with anticoagulation therapy   Ulcer of left knee, with necrosis of bone ([place])   Open knee wound, left, subsequent encounter   Consultants: Orthopedics Cardiology  Procedures: Echocardiogram Left knee reduction with external fixation and evacuation of hematoma Left thigh debridement on 10/12 and again on 10/14  Antibiotics: Cephalexin 9/15 through 10/4 Cefazolin 10/5 >>   Time spent: 35 minutes    Erin Hearing ANP  [facility] 7 am - 330 pm/M-F for direct patient care and secure chat Please refer to Amion for contact info 35  days

## 2021-07-17 NOTE — Progress Notes (Signed)
RT NOTES: ABG obtained and sent to lab. Lab tech notified.  

## 2021-07-17 NOTE — Progress Notes (Signed)
PT Cancellation Note  Patient Details Name: Joseph [name]. MRN: [id number] DOB: May 31, 1956   Cancelled Treatment:    Reason Eval/Treat Not Completed: Medical issues which prohibited therapy;Fatigue/lethargy limiting ability to participate - pt states "I just can't do it today, I keep falling asleep" and proceeds to fall asleep x3 while PT is in room. PT spoke with pt's wife about concerns regarding pt's current level of mobility and need for mobility ASAP to avoid being bed-level for the forseeable future. Pt's wife agrees, but also declines therapy given pt's level of arousal. PT to check back next week, will not have time to come back today and pt/wife aware. Will continue to follow.  Stacie Glaze, PT DPT Acute Rehabilitation Services Pager [phone]  Office [phone]    Louis Matte 07/17/2021, 12:33 PM

## 2021-07-18 ENCOUNTER — Inpatient Hospital Stay (HOSPITAL_COMMUNITY): Payer: HMO

## 2021-07-18 DIAGNOSIS — S83105A Unspecified dislocation of left knee, initial encounter: Secondary | ICD-10-CM | POA: Diagnosis not present

## 2021-07-18 LAB — GLUCOSE, CAPILLARY
Glucose-Capillary: 198 mg/dL — ABNORMAL HIGH (ref 70–99)
Glucose-Capillary: 193 mg/dL — ABNORMAL HIGH (ref 70–99)
Glucose-Capillary: 208 mg/dL — ABNORMAL HIGH (ref 70–99)
Glucose-Capillary: 188 mg/dL — ABNORMAL HIGH (ref 70–99)

## 2021-07-18 LAB — BLOOD GAS, ARTERIAL
Acid-base deficit: 1.4 mmol/L (ref 0.0–2.0)
Bicarbonate: 23.7 mmol/L (ref 20.0–28.0)
FIO2: 76
O2 Saturation: 90.5 %
Patient temperature: 37
pCO2 arterial: 46.5 mmHg (ref 32.0–48.0)
pH, Arterial: 7.328 — ABNORMAL LOW (ref 7.350–7.450)
pO2, Arterial: 59.7 mmHg — ABNORMAL LOW (ref 83.0–108.0)

## 2021-07-18 LAB — CBC
HCT: 25.1 % — ABNORMAL LOW (ref 39.0–52.0)
HCT: 25.4 % — ABNORMAL LOW (ref 39.0–52.0)
Hemoglobin: 7.6 g/dL — ABNORMAL LOW (ref 13.0–17.0)
Hemoglobin: 7.7 g/dL — ABNORMAL LOW (ref 13.0–17.0)
MCH: 28.1 pg (ref 26.0–34.0)
MCH: 28.5 pg (ref 26.0–34.0)
MCHC: 30.3 g/dL (ref 30.0–36.0)
MCHC: 30.3 g/dL (ref 30.0–36.0)
MCV: 93 fL (ref 80.0–100.0)
MCV: 94.1 fL (ref 80.0–100.0)
Platelets: 405 10*3/uL — ABNORMAL HIGH (ref 150–400)
Platelets: 428 10*3/uL — ABNORMAL HIGH (ref 150–400)
RBC: 2.7 MIL/uL — ABNORMAL LOW (ref 4.22–5.81)
RBC: 2.7 MIL/uL — ABNORMAL LOW (ref 4.22–5.81)
RDW: 16.5 % — ABNORMAL HIGH (ref 11.5–15.5)
RDW: 16.6 % — ABNORMAL HIGH (ref 11.5–15.5)
WBC: 8.1 10*3/uL (ref 4.0–10.5)
WBC: 8.3 10*3/uL (ref 4.0–10.5)
nRBC: 0 % (ref 0.0–0.2)
nRBC: 0.2 % (ref 0.0–0.2)

## 2021-07-18 LAB — COMPREHENSIVE METABOLIC PANEL
ALT: 7 U/L (ref 0–44)
AST: 16 U/L (ref 15–41)
Albumin: 2.3 g/dL — ABNORMAL LOW (ref 3.5–5.0)
Alkaline Phosphatase: 86 U/L (ref 38–126)
Anion gap: 10 (ref 5–15)
BUN: 99 mg/dL — ABNORMAL HIGH (ref 8–23)
CO2: 20 mmol/L — ABNORMAL LOW (ref 22–32)
Calcium: 8.8 mg/dL — ABNORMAL LOW (ref 8.9–10.3)
Chloride: 104 mmol/L (ref 98–111)
Creatinine, Ser: 1.58 mg/dL — ABNORMAL HIGH (ref 0.61–1.24)
GFR, Estimated: 49 mL/min — ABNORMAL LOW (ref >60)
Glucose, Bld: 212 mg/dL — ABNORMAL HIGH (ref 70–99)
Potassium: 5.4 mmol/L — ABNORMAL HIGH (ref 3.5–5.1)
Sodium: 134 mmol/L — ABNORMAL LOW (ref 135–145)
Total Bilirubin: 1 mg/dL (ref 0.3–1.2)
Total Protein: 5.9 g/dL — ABNORMAL LOW (ref 6.5–8.1)

## 2021-07-18 LAB — RENAL FUNCTION PANEL
Albumin: 2.4 g/dL — ABNORMAL LOW (ref 3.5–5.0)
Anion gap: 9 (ref 5–15)
BUN: 91 mg/dL — ABNORMAL HIGH (ref 8–23)
CO2: 21 mmol/L — ABNORMAL LOW (ref 22–32)
Calcium: 9 mg/dL (ref 8.9–10.3)
Chloride: 105 mmol/L (ref 98–111)
Creatinine, Ser: 1.46 mg/dL — ABNORMAL HIGH (ref 0.61–1.24)
GFR, Estimated: 53 mL/min — ABNORMAL LOW (ref >60)
Glucose, Bld: 193 mg/dL — ABNORMAL HIGH (ref 70–99)
Phosphorus: 4.4 mg/dL (ref 2.5–4.6)
Potassium: 5.2 mmol/L — ABNORMAL HIGH (ref 3.5–5.1)
Sodium: 135 mmol/L (ref 135–145)

## 2021-07-18 LAB — BRAIN NATRIURETIC PEPTIDE: B Natriuretic Peptide: 196.5 pg/mL — ABNORMAL HIGH (ref 0.0–100.0)

## 2021-07-18 LAB — ALDOSTERONE: Aldosterone: <1 ng/dL (ref 0.0–30.0)

## 2021-07-18 LAB — PROTIME-INR
INR: 3.8 — ABNORMAL HIGH (ref 0.8–1.2)
Prothrombin Time: 37.6 seconds — ABNORMAL HIGH (ref 11.4–15.2)

## 2021-07-18 LAB — ALDOSTERONE + RENIN ACTIVITY W/ RATIO
ALDO / PRA Ratio: <0.2 (ref 0.0–30.0)
Aldosterone: <1 ng/dL (ref 0.0–30.0)
PRA LC/MS/MS: 5.551 ng/mL/hr — ABNORMAL HIGH (ref 0.167–5.380)

## 2021-07-18 LAB — MAGNESIUM: Magnesium: 2.2 mg/dL (ref 1.7–2.4)

## 2021-07-18 MED ORDER — ALBUMIN HUMAN 25 % IV SOLN
12.5000 g | Freq: Once | INTRAVENOUS | Status: AC
  Administered 2021-07-18: 12.5 g via INTRAVENOUS
  Filled 2021-07-18 (×2): qty 50

## 2021-07-18 MED ORDER — FUROSEMIDE 10 MG/ML IJ SOLN
60.0000 mg | Freq: Once | INTRAMUSCULAR | Status: AC
  Administered 2021-07-18: 60 mg via INTRAVENOUS
  Filled 2021-07-18: qty 6

## 2021-07-18 MED ORDER — SODIUM CHLORIDE 0.9 % IV BOLUS
250.0000 mL | Freq: Once | INTRAVENOUS | Status: DC
Start: 2021-07-19 — End: 2021-07-18

## 2021-07-18 MED ORDER — HYDROXYZINE HCL 25 MG PO TABS
25.0000 mg | ORAL_TABLET | Freq: Four times a day (QID) | ORAL | Status: DC | PRN
  Administered 2021-07-18 – 2021-07-26 (×5): 25 mg via ORAL
  Filled 2021-07-18 (×7): qty 1

## 2021-07-18 MED ORDER — SODIUM POLYSTYRENE SULFONATE 15 GM/60ML PO SUSP
15.0000 g | Freq: Once | ORAL | Status: AC
  Administered 2021-07-18: 15 g via ORAL
  Filled 2021-07-18: qty 60

## 2021-07-18 MED ORDER — ALBUMIN HUMAN 5 % IV SOLN: 12.5000 g | Freq: Once | INTRAVENOUS | Status: DC

## 2021-07-18 NOTE — Progress Notes (Signed)
PROGRESS NOTE    Joseph [name].  VOH:[id number] DOB: [birth year] DOA: 06/11/2021 PCP: Donald Prose, MD    Brief Narrative:  65 y.o. male with PMH significant for severe morbid obesity, OSA on CPAP, DM2, HTN, HLD, PAD, DVT on Coumadin, GERD, anxiety/depression, polyneuropathy, multiple falls who presented to the ED on 06/11/21 after fall at home resulting in left knee pain.  He was found to have left knee dislocation and fracture fragments with associated left knee effusion.  There were concern for possible left knee hematoma in setting of supratherapeutic INR.  Seen by orthopedic surgery, patient underwent closed reduction of left knee dislocation but no surgical management was planned because of patient's body habitus.  Patient is scheduled for a I&D on 07/08/2021 by Dr. Sharol Given  10/22 daughter at bedside states his lower extremity is more edematous and feels fathers little more short of breath.  .Patient using his CPAP.     Consultants:    Procedures: Echocardiogram Left knee reduction with external fixation and evacuation of hematoma Left thigh debridement on 10/12 and again on 10/14   Antibiotics: Cephalexin 9/15 through 10/4 Cefazolin 10/5 >>   Subjective: Feels sob with just "talking" . No cp.  Objective: Vitals:   07/18/21 0300 07/18/21 0400 07/18/21 0429 07/18/21 0810  BP:   (!) 143/40 (!) 154/40  Pulse:   80 80  Resp:   18 20  Temp:   98.4 F (36.9 C) 97.9 F (36.6 C)  TempSrc:   Oral Oral  SpO2: (!) 87% 95% 96% 97%  Weight:      Height:        Intake/Output Summary (Last 24 hours) at 07/18/2021 0949 Last data filed at 07/18/2021 0600 Gross per 24 hour  Intake 508.38 ml  Output 375 ml  Net 133.38 ml   Filed Weights   06/25/21 0900 07/01/21 1518 07/08/21 0958  Weight: (!) 218 kg (!) 218 kg (!) 218 kg    Examination:  General exam: Appears calm and comfortable, on CPAP Respiratory system: Clear to auscultation. Respiratory effort normal. Cardiovascular  system: S1 & S2 heard, RRR. No gallops  Gastrointestinal system: Abdomen is nondistended, soft and nontender. Normal bowel sounds heard. Central nervous system: Alert and oriented. No focal neurological deficits. Extremities: Lower extremity edema bilaterally Psychiatry: Mood & affect appropriate.     Data Reviewed: I have personally reviewed following labs and imaging studies  CBC: Recent Labs  Lab 07/12/21 0245 07/13/21 0432 07/14/21 0336 07/15/21 0406 07/16/21 0401 07/17/21 0343 07/18/21 0337  WBC 7.3 6.5 6.2 6.5 7.0 7.4 8.1  NEUTROABS 4.3 3.8  --   --   --   --   --   HGB 7.0* 8.0* 8.6* 8.1* 7.6* 8.2* 7.6*  HCT 23.8* 27.5* 28.9* 27.3* 25.8* 27.5* 25.1*  MCV 95.6 95.5 94.1 94.5 94.5 94.2 93.0  PLT 315 317 358 383 396 423* 694*   Basic Metabolic Panel: Recent Labs  Lab 07/14/21 0336 07/15/21 0406 07/16/21 0401 07/17/21 0343 07/18/21 0337  NA 135 129* 136 136 135  K 5.5* 5.0 5.2* 5.5* 5.2*  CL 105 100 105 106 105  CO2 24 22 22 24  21*  GLUCOSE 164* 199* 149* 172* 193*  BUN 73* 71* 77* 80* 91*  CREATININE 1.31* 1.36* 1.25* 1.35* 1.46*  CALCIUM 9.1 8.7* 8.9 9.1 9.0  PHOS 3.7 3.6 3.7 4.2 4.4   GFR: Estimated Creatinine Clearance: 97.7 mL/min (A) (by C-G formula based on SCr of 1.46 mg/dL (H)). Liver Function  Tests: Recent Labs  Lab 07/13/21 0432 07/14/21 0336 07/15/21 0406 07/16/21 0401 07/17/21 0343 07/18/21 0337  AST 11*  --   --   --   --   --   ALT <5  --   --   --   --   --   ALKPHOS 73  --   --   --   --   --   BILITOT 0.6  --   --   --   --   --   PROT 5.4*  --   --   --   --   --   ALBUMIN 1.9* 2.1* 2.0* 2.0* 2.3* 2.4*   No results for input(s): LIPASE, AMYLASE in the last 168 hours. No results for input(s): AMMONIA in the last 168 hours. Coagulation Profile: Recent Labs  Lab 07/14/21 0336 07/15/21 0406 07/16/21 0401 07/17/21 0343 07/18/21 0337  INR 1.4* 1.8* 2.4* 2.6* 3.8*   Cardiac Enzymes: Recent Labs  Lab 07/13/21 1304  07/17/21 1635  CKTOTAL 17* 16*   BNP (last 3 results) No results for input(s): PROBNP in the last 8760 hours. HbA1C: No results for input(s): HGBA1C in the last 72 hours. CBG: Recent Labs  Lab 07/17/21 0737 07/17/21 1112 07/17/21 1550 07/17/21 2057 07/18/21 0813  GLUCAP 149* 136* 89 141* 198*   Lipid Profile: No results for input(s): CHOL, HDL, LDLCALC, TRIG, CHOLHDL, LDLDIRECT in the last 72 hours. Thyroid Function Tests: No results for input(s): TSH, T4TOTAL, FREET4, T3FREE, THYROIDAB in the last 72 hours. Anemia Panel: No results for input(s): VITAMINB12, FOLATE, FERRITIN, TIBC, IRON, RETICCTPCT in the last 72 hours. Sepsis Labs: No results for input(s): PROCALCITON, LATICACIDVEN in the last 168 hours.  No results found for this or any previous visit (from the past 240 hour(s)).       Radiology Studies: No results found.      Scheduled Meds:  (feeding supplement) PROSource Plus  30 mL Oral BID BM   amLODipine  10 mg Oral Daily   vitamin C  1,000 mg Oral Daily   cephALEXin  250 mg Oral QHS   chlorthalidone  50 mg Oral Daily   docusate sodium  100 mg Oral Daily   feeding supplement (GLUCERNA SHAKE)  237 mL Oral Q24H   FLUoxetine  60 mg Oral QPM   insulin aspart  0-20 Units Subcutaneous TID WC   insulin aspart  0-5 Units Subcutaneous QHS   insulin aspart  3 Units Subcutaneous TID WC   insulin glargine-yfgn  55 Units Subcutaneous QHS   methocarbamol  1,000 mg Oral BID   multivitamin with minerals  1 tablet Oral Daily   nutrition supplement (JUVEN)  1 packet Oral BID BM   oxyCODONE  10 mg Oral Daily   pantoprazole  40 mg Oral Daily   pioglitazone  45 mg Oral Daily   polyethylene glycol  17 g Oral BID   Ensure Max Protein  11 oz Oral Daily   senna  1 tablet Oral Daily   tamsulosin  0.4 mg Oral Daily   Warfarin - Pharmacist Dosing Inpatient   Does not apply q1600   zinc sulfate  220 mg Oral Daily   Continuous Infusions:  sodium chloride 10 mL/hr at  07/13/21 1107   magnesium sulfate bolus IVPB     methocarbamol (ROBAXIN) IV      Assessment & Plan:   Principal Problem:   Left knee dislocation, initial encounter Active Problems:   Pure hypercholesterolemia   Essential  hypertension, benign   OSA (obstructive sleep apnea)   Hx pulmonary embolism   Esophageal reflux   Diabetes mellitus type 2, uncontrolled   Diabetic neuropathy (HCC)   Acute renal failure superimposed on stage 3b chronic kidney disease (HCC)   Normocytic anemia   Class 3 obesity (HCC)   Aortic atherosclerosis (HCC)   Coronary atherosclerosis   Effusion of left knee   Left knee dislocation   Pressure injury of skin   Unstageable pressure ulcer of sacral region ([place])   At risk for hemorrhage associated with anticoagulation therapy   Ulcer of left knee, with necrosis of bone (HCC)   Open knee wound, left, subsequent encounter   Moderate episode of recurrent major depressive disorder ([place])   Left knee dislocation status post left knee reduction external fixation with evacuation Left knee hematoma -Presented after fall.  X-ray w/ L knee dislocation and several fracture fragments/hematoma.  Orthopedic team initially recommendeded conservative therapy with nonweightbearing -MRI obtained 9/30: significant ligamentous injury/lateral patellar dislocation.  Orthopedics team revisited surgical option and decided they would proceed with surgical repair -s/p L knee extensive debridement, reduction of knee dislocation, external fixation spanning of knee dislocation w/ wound VAC application. -s/p I&D on Wednesday, 07/08/2021 and Friday, 07/10/21 - status but after most recent surgery he has transitioned to [facility] as of 10/17 -Continues to have difficulty with pain management is contributing to his anxiety with physical therapy.  Discussed with family that once current episode of lethargy which is presumed related to Ativan resolves we will consider changing to longer acting  OxyContin with Oxy IR for breakthrough pain and discontinuation of IV Dilaudid. 10/22 given lethargy yesterday his oxycodone IR was decreased from 20 mg every 4 hours to 15 mg every 4 hours. Family wants Ativan to be discontinued and use something else that is less sedated for antianxiety. More awake today and interactive.   History of pulmonary embolism/chronic anticoagulation with warfarin INR supratherapeutic mildly at 3.8.  Spoke to pharmacy they will hold dose today.   Recurrent AKI with recurrent hyperkalemia Baseline creatinine  1.2 w/ GFR >60.  Creatinine as of 10/18 is 1.31 with a BUN of 73 and potassium 5.5-potassium did decrease to as low as 5.0 stable renal function Previously received Lokelma for hyperkalemia-discussed with nephrology Dr. Joelyn Oms.  Given patient's current clinical status that he considers current potassium level normobulimic and with only treat elevated potassium with Lokelma if greater than 6.0 nephrology recommendation is to utilize chlorthalidone to help with electrolyte stabilization and renal excretion of potassium Random cortisol normal, CK low at 17; aldosterone level and aldosterone renin activity have been obtained and are pending 10/22 potassium 5.2.  Will give Kayexalate 15 g x 1   Chronic peripheral edema Given past issues with worsening renal function after Lasix plan is to give albumin followed by Lasix 80 mg IV followed by another bolus of albumin 5 liters positive 10/21 no significant increase in urine output with combination of albumin and Lasix.  BUN increased slightly to 80 and creatinine increased slightly to 1.35.  Increased slightly to 5.5 from 5.2 10/22 nephrology have recommended utilizing chlorthalidone and not Lasix.  First dose started today  Does c/o sob today, will ck bnp    Depression /anxiety -Patient was on Prozac prior to admission-dose increased to 60 mg on 10/19 -Currently started on as needed Ativan for significant anxiety  especially with therapy sessions.  Asked for psych consultation and they suspect Ativan is contributing to his lethargy so  they have changed to a much lower dose of 0.25 mg as opposed to 0.5 mg and this is only to be given prior to physical therapy sessions. -Patient encouraged to overcome fears regarding mobility and ambulation in order to progress to inpatient rehabilitation setting   Profound physical deconditioning Patient was able to ambulate into the hospital but due to severity of injury requiring surgery as well as orthopedic recommendation for nonweightbearing during the initial portion of his hospitalization his ability to participate in therapies has been limited.  His significant elevation in BMI is also contributing to his ability to recover. 10/17 WBAT Continue bariatric Kreg tilt bed-this will be most helpful in assisting this gentleman regain mobility.  Increased risk of orthostasis with position changes including changing from supine to seated  Continue prn Ativan for anxiety noting this is impacting his ability to effectively participate with therapy   Type 2 diabetes mellitus Diabetic neuropathy -A1c 6.1 on 9/22 -Currently on Actos, Semglee 50 units nightly with NovoLog 3 units  TID AC and SSI   Essential hypertension BP is currently at goal Continue on amlodipine and lisinopril.   Hyperlipidemia Pravastatin on hold.   Chronic normocytic anemia Baseline hemoglobin ~10.   S/p of 2 units of PRBCs and 1 unit of FFP.   Preoperatively hemoglobin 7.6-follow CBC daily-current Hgb 7.6   Transient bradycardia/ 1st degree heart block/frequent PVCs Asymptomatic in context of known sleep apnea but occurs while awake.  EKG significant for 1st degree AV block which is chronic.  Telemetry with heart rate down to high 30s while awake Cardiology plans Zio patch at discharge.       Severe morbid obesity  -Body mass index is 63.41 kg/m. Patient has been advised to make an attempt to  improve diet and exercise patterns to aid in weight loss. PT recommended Kreg bed.   GERD -Continue Protonix-if renal function does not rapidly improve will need to hold until renal function normal   OSA, compliant with CPAP Has been compliant with CPAP, encouraged to continue. Continue CPAP HS and during the day until physical deconditioning improved 10/21 ABG without evidence of hypercarbia in context of increased lethargy   Constipation, opiate induced -Continue Colace, Senokot, Miralax -Dulcolax suppository prn   Pressure injury -Mid-nose, not present on admission. Secondary to CPAP mask -10/17 begin foam dressing over bridge of nose     DVT prophylaxis: Coumadin Code Status: Full Family Communication: Daughter at bedside  Disposition Plan:  Status is: Inpatient  Remains inpatient appropriate because:Inpatient level of care appropriate due to severity of illness   Dispo: The patient is from: Home              Anticipated d/c is to: TBD              Patient currently is not medically stable to d/c.              Difficult to place patient No          LOS: 36 days   Time spent: 35 minutes with more than 50% on [place], MD [facility] Pager [phone]  If 7PM-7AM, please contact night-coverage 07/18/2021, 9:49 AM

## 2021-07-18 NOTE — Progress Notes (Signed)
Pt wound vac keeps on beeping and alerting blockage, then tubing connecting to the black foam was assessed to have sediments or some kind of blockage but wound vac has no dressing changes order. Attending MD was notified and wound care consult was received. Several attempts was made to reach [facility] but not response. Orthopedic MD on-call Dr. Ninfa Linden was notified and telephone order received to remove wound vac and apply xeroform with dry dressing. MD to assess in the morning. Pt assigned nurse notified. Delia Heady RN

## 2021-07-18 NOTE — Progress Notes (Signed)
ANTICOAGULATION CONSULT NOTE - Follow Up Consult  Pharmacy Consult for Warfarin Indication: h/o pulmonary embolus  Allergies  Allergen Reactions   Tape     Peels skin on legs   Chlorhexidine Rash    Wipes    Patient Measurements: Height: 6\' 1"  (185.4 cm) Weight: (!) 218 kg (480 lb 9.6 oz) IBW/kg (Calculated) : 79.9 Heparin Dosing Weight: 132.5 kg  Vital Signs: Temp: 97.9 F (36.6 C) (10/22 0810) Temp Source: Oral (10/22 0810) BP: 154/40 (10/22 0810) Pulse Rate: 80 (10/22 0810)  Labs: Recent Labs    07/16/21 0401 07/17/21 0343 07/17/21 1635 07/18/21 0337  HGB 7.6* 8.2*  --  7.6*  HCT 25.8* 27.5*  --  25.1*  PLT 396 423*  --  405*  LABPROT 26.5* 28.1*  --  37.6*  INR 2.4* 2.6*  --  3.8*  HEPARINUNFRC 0.29*  --   --   --   CREATININE 1.25* 1.35*  --  1.46*  CKTOTAL  --   --  16*  --      Estimated Creatinine Clearance: 97.7 mL/min (A) (by C-G formula based on SCr of 1.46 mg/dL (H)).   Medications:  PTA Warfarin:  5 mg on Sun/Mon/Wed/Fri evening, and 7.5 mg on Tue/Thur/Sat evening.   INR was supratherapeutic (6.2) at admission  Assessment: 45 YOM on lifelong warfarin therapy for hx of PE, body habitus, and sedentary lifestyle admitted on 9/15 with left knee dislocation and effusion concerning for hematoma. At admission, warfarin was held, vitamin K was given for supratherapeutic INR (6.2), and pt was transfused.   Currently back on warfarin. INR today is SUPRAtherapeutic (INR 3.8 << 2.6, goal of 2-3). Will hold dose today and monitor trends in INR.  Goal of Therapy:  INR 2-3 Monitor platelets by anticoagulation protocol: Yes   Plan:  - Hold Warfarin dose today - Daily PT/INR, CBC q72h - Will continue to monitor for any signs/symptoms of bleeding and will follow up with PT/INR in the a.m.    Thank you for allowing pharmacy to be a part of this patient's care.  Alycia Rossetti, PharmD, BCPS Clinical Pharmacist Clinical phone for 07/18/2021:  [phone] 07/18/2021 2:28 PM   **Pharmacist phone directory can now be found on [email or web address] ([email or web address]).  Listed under [place].

## 2021-07-19 DIAGNOSIS — N179 Acute kidney failure, unspecified: Secondary | ICD-10-CM | POA: Diagnosis not present

## 2021-07-19 DIAGNOSIS — E7800 Pure hypercholesterolemia, unspecified: Secondary | ICD-10-CM

## 2021-07-19 DIAGNOSIS — E1143 Type 2 diabetes mellitus with diabetic autonomic (poly)neuropathy: Secondary | ICD-10-CM | POA: Diagnosis not present

## 2021-07-19 DIAGNOSIS — D649 Anemia, unspecified: Secondary | ICD-10-CM

## 2021-07-19 DIAGNOSIS — Z9189 Other specified personal risk factors, not elsewhere classified: Secondary | ICD-10-CM | POA: Diagnosis not present

## 2021-07-19 DIAGNOSIS — K219 Gastro-esophageal reflux disease without esophagitis: Secondary | ICD-10-CM

## 2021-07-19 DIAGNOSIS — L89899 Pressure ulcer of other site, unspecified stage: Secondary | ICD-10-CM

## 2021-07-19 DIAGNOSIS — S83105A Unspecified dislocation of left knee, initial encounter: Secondary | ICD-10-CM | POA: Diagnosis not present

## 2021-07-19 LAB — CBC
HCT: 26.9 % — ABNORMAL LOW (ref 39.0–52.0)
Hemoglobin: 7.8 g/dL — ABNORMAL LOW (ref 13.0–17.0)
MCH: 27.7 pg (ref 26.0–34.0)
MCHC: 29 g/dL — ABNORMAL LOW (ref 30.0–36.0)
MCV: 95.4 fL (ref 80.0–100.0)
Platelets: 441 10*3/uL — ABNORMAL HIGH (ref 150–400)
RBC: 2.82 MIL/uL — ABNORMAL LOW (ref 4.22–5.81)
RDW: 16.6 % — ABNORMAL HIGH (ref 11.5–15.5)
WBC: 7.9 10*3/uL (ref 4.0–10.5)
nRBC: 0.3 % — ABNORMAL HIGH (ref 0.0–0.2)

## 2021-07-19 LAB — GLUCOSE, CAPILLARY
Glucose-Capillary: 234 mg/dL — ABNORMAL HIGH (ref 70–99)
Glucose-Capillary: 218 mg/dL — ABNORMAL HIGH (ref 70–99)
Glucose-Capillary: 211 mg/dL — ABNORMAL HIGH (ref 70–99)
Glucose-Capillary: 158 mg/dL — ABNORMAL HIGH (ref 70–99)

## 2021-07-19 LAB — PROTIME-INR
INR: 4.6 (ref 0.8–1.2)
Prothrombin Time: 43.2 seconds — ABNORMAL HIGH (ref 11.4–15.2)

## 2021-07-19 MED ORDER — PHYTONADIONE 5 MG PO TABS
5.0000 mg | ORAL_TABLET | Freq: Once | ORAL | Status: AC
  Administered 2021-07-19: 5 mg via ORAL
  Filled 2021-07-19: qty 1

## 2021-07-19 NOTE — Consult Note (Signed)
[place] Nurse Consult Note: [place] Nursing staff is not available on weekends; note to evaluate NPWT over weekend noted.  Dr. Sharol Given is sent a Secure Chat message this morning to advise on POC and NPWT.  Last notation is that the NPWT device was a Prevena disposable device. If [place] Nursing involvement is needed, please advise.  [place] nursing team will not follow, but will remain available to this patient, the nursing and medical teams.  Please re-consult if needed. Thanks, Maudie Flakes, MSN, RN, [place], Arther Abbott  Pager# [phone]

## 2021-07-19 NOTE — Progress Notes (Addendum)
PROGRESS NOTE    Joseph [name].  OEV:[id number]  DOB: June 11, 1956  PCP: Donald Prose, MD Chief compliant: fall, left knee pain 65 y.o. male with PMH significant for severe morbid obesity, OSA on CPAP, DM2, HTN, HLD, PAD, DVT on Coumadin, GERD, anxiety/depression, polyneuropathy, multiple falls who presented to the ED on 06/11/21 after fall at home resulting in left knee pain.  He was found to have left knee dislocation and fracture fragments with associated left knee effusion.  There were concern for possible left knee hematoma in setting of supratherapeutic INR.  ED Course: Afebrile.Seen by orthopedic surgery, patient underwent closed reduction of left knee dislocation but no surgical management was planned because of patient's body habitus. Hospital course: Patient admitted to [facility] for supportive mx, ortho following.MRI obtained 9/30: significant ligamentous injury/lateral patellar dislocation.  Orthopedics team revisited surgical option and now s/p L knee extensive debridement, reduction of knee dislocation, external fixation spanning of knee dislocation w/ wound VAC application.-s/p I&D on 10/12 &10/14--transitioned to WBAT as of 10/17--however, continues to have difficulty with pain management - contributing to anxiety with physical therapy--ativan prescribed for anxiety causing lethargy. On CPAP for sleep apnea, foam dressing over nose due to pressure injury. Preoperatively hemoglobin 7.6- S/p of 2 units of PRBCs and 1 unit of FFP for supratherapeutic INR during hosp course. Also noted to have transient bradycardia , 1st degree AV block-Cards rec ziopatch on d/c. 10/22 : Family concerned about dyspnea on talking,checking BNP. Per Dr Amery-> nephrology Dr Joelyn Oms recommended utilizing chlorthalidone and not Lasix for diuresis, treat hyperkalemia only if >6. Given lethargy yesterday his oxycodone IR was decreased from 20 mg Q4hrs to 15 mg Q4hrs. Family wants Ativan to be discontinued.   Hospitalization prolonged with severe physical deconditioning PT recommended Kreg bed.   Subjective:  Morbidly obese male sitting in bed with CPAP on.  Appears oriented x3 and defers most questions to wife who is at bedside.  Wife today concerned about persistent edema/anasarca and diuretics not helping.  Patient apparently uses CPAP only at night at baseline but during this hospitalization has been using continuously.  Wife reports noted during her visits here, occasional drop in O2 sats to 70s and 80s at night  Although patient's mental status improved, he is concerned about reduction in pain medication dosages.   Objective: Vitals:   07/18/21 1553 07/18/21 1915 07/18/21 2337 07/19/21 0447  BP: (!) 143/38 (!) 149/48 (!) 148/47 (!) 151/46  Pulse: 80 82 79 76  Resp: 20 20 17 18   Temp: 97.6 F (36.4 C) 98.5 F (36.9 C) 97.8 F (36.6 C) 97.9 F (36.6 C)  TempSrc: Oral Oral Oral Oral  SpO2: 91% 90% 91% 95%  Weight:      Height:        Intake/Output Summary (Last 24 hours) at 07/19/2021 0908 Last data filed at 07/18/2021 2325 Gross per 24 hour  Intake 400 ml  Output 110 ml  Net 290 ml   Filed Weights   06/25/21 0900 07/01/21 1518 07/08/21 0958  Weight: (!) 218 kg (!) 218 kg (!) 218 kg    Physical Examination:  General: Morbidly obese male laying in bed with continuous CPAP, no acute distress noted Head ENT: Pressure wounds/rash along nasal bridge from CPAP use.   PERRLA, obese neck Heart: S1-S2 heard, regular rate and rhythm, no murmurs.  2+ pitting leg edema noted Lungs: Equal air entry bilaterally, no rhonchi or rales on exam, no accessory muscle use.  CPAP + Abdomen: Obese,  bowel sounds heard, soft, nontender, nondistended. No organomegaly.  No CVA tenderness Extremities: 2+ pitting pedal edema.  External fixator/wound VAC along left lower extremity.  No cyanosis or clubbing. Neurological: Awake alert oriented x3, no focal weakness or numbness, strength and  sensations to crude touch intact Skin: Nasal rash/pressure wound as above  Data Reviewed: I have personally reviewed following labs and imaging studies  CBC: Recent Labs  Lab 07/13/21 0432 07/14/21 0336 07/16/21 0401 07/17/21 0343 07/18/21 0337 07/18/21 2128 07/19/21 0148  WBC 6.5   < > 7.0 7.4 8.1 8.3 7.9  NEUTROABS 3.8  --   --   --   --   --   --   HGB 8.0*   < > 7.6* 8.2* 7.6* 7.7* 7.8*  HCT 27.5*   < > 25.8* 27.5* 25.1* 25.4* 26.9*  MCV 95.5   < > 94.5 94.2 93.0 94.1 95.4  PLT 317   < > 396 423* 405* 428* 441*   < > = values in this interval not displayed.   Basic Metabolic Panel: Recent Labs  Lab 07/14/21 0336 07/15/21 0406 07/16/21 0401 07/17/21 0343 07/18/21 0337 07/18/21 2128  NA 135 129* 136 136 135 134*  K 5.5* 5.0 5.2* 5.5* 5.2* 5.4*  CL 105 100 105 106 105 104  CO2 24 22 22 24  21* 20*  GLUCOSE 164* 199* 149* 172* 193* 212*  BUN 73* 71* 77* 80* 91* 99*  CREATININE 1.31* 1.36* 1.25* 1.35* 1.46* 1.58*  CALCIUM 9.1 8.7* 8.9 9.1 9.0 8.8*  MG  --   --   --   --   --  2.2  PHOS 3.7 3.6 3.7 4.2 4.4  --    GFR: Estimated Creatinine Clearance: 90.3 mL/min (A) (by C-G formula based on SCr of 1.58 mg/dL (H)). Liver Function Tests: Recent Labs  Lab 07/13/21 0432 07/14/21 0336 07/15/21 0406 07/16/21 0401 07/17/21 0343 07/18/21 0337 07/18/21 2128  AST 11*  --   --   --   --   --  16  ALT <5  --   --   --   --   --  7  ALKPHOS 73  --   --   --   --   --  86  BILITOT 0.6  --   --   --   --   --  1.0  PROT 5.4*  --   --   --   --   --  5.9*  ALBUMIN 1.9*   < > 2.0* 2.0* 2.3* 2.4* 2.3*   < > = values in this interval not displayed.   No results for input(s): LIPASE, AMYLASE in the last 168 hours. No results for input(s): AMMONIA in the last 168 hours. Coagulation Profile: Recent Labs  Lab 07/15/21 0406 07/16/21 0401 07/17/21 0343 07/18/21 0337 07/19/21 0148  INR 1.8* 2.4* 2.6* 3.8* 4.6*   Cardiac Enzymes: Recent Labs  Lab 07/13/21 1304  07/17/21 1635  CKTOTAL 17* 16*   BNP (last 3 results) No results for input(s): PROBNP in the last 8760 hours. HbA1C: No results for input(s): HGBA1C in the last 72 hours. CBG: Recent Labs  Lab 07/17/21 2057 07/18/21 0813 07/18/21 1148 07/18/21 1715 07/18/21 2230  GLUCAP 141* 198* 193* 208* 188*   Lipid Profile: No results for input(s): CHOL, HDL, LDLCALC, TRIG, CHOLHDL, LDLDIRECT in the last 72 hours. Thyroid Function Tests: No results for input(s): TSH, T4TOTAL, FREET4, T3FREE, THYROIDAB in the last 72 hours. Anemia Panel: No  results for input(s): VITAMINB12, FOLATE, FERRITIN, TIBC, IRON, RETICCTPCT in the last 72 hours. Sepsis Labs: No results for input(s): PROCALCITON, LATICACIDVEN in the last 168 hours.  No results found for this or any previous visit (from the past 240 hour(s)).    Radiology Studies: DG CHEST PORT 1 VIEW  Result Date: 07/18/2021 CLINICAL DATA:  Shortness of breath. EXAM: PORTABLE CHEST 1 VIEW COMPARISON:  April 03, 2020 FINDINGS: Moderate to marked severity diffusely increased interstitial lung markings are seen, slightly more prominent within the perihilar regions, bilaterally. There is no evidence of a pleural effusion or pneumothorax. The heart size and mediastinal contours are within normal limits. The visualized skeletal structures are unremarkable. IMPRESSION: Moderate to marked severity pulmonary edema. Electronically Signed   By: Virgina Norfolk M.D.   On: 07/18/2021 21:09      Scheduled Meds:  (feeding supplement) PROSource Plus  30 mL Oral BID BM   amLODipine  10 mg Oral Daily   vitamin C  1,000 mg Oral Daily   cephALEXin  250 mg Oral QHS   chlorthalidone  50 mg Oral Daily   docusate sodium  100 mg Oral Daily   feeding supplement (GLUCERNA SHAKE)  237 mL Oral Q24H   FLUoxetine  60 mg Oral QPM   insulin aspart  0-20 Units Subcutaneous TID WC   insulin aspart  0-5 Units Subcutaneous QHS   insulin aspart  3 Units Subcutaneous TID WC    insulin glargine-yfgn  55 Units Subcutaneous QHS   methocarbamol  1,000 mg Oral BID   multivitamin with minerals  1 tablet Oral Daily   nutrition supplement (JUVEN)  1 packet Oral BID BM   oxyCODONE  10 mg Oral Daily   pantoprazole  40 mg Oral Daily   pioglitazone  45 mg Oral Daily   polyethylene glycol  17 g Oral BID   Ensure Max Protein  11 oz Oral Daily   senna  1 tablet Oral Daily   tamsulosin  0.4 mg Oral Daily   Warfarin - Pharmacist Dosing Inpatient   Does not apply q1600   zinc sulfate  220 mg Oral Daily   Continuous Infusions:  sodium chloride 10 mL/hr at 07/13/21 1107   magnesium sulfate bolus IVPB     methocarbamol (ROBAXIN) IV       Assessment/Plan:     1.Fall, left knee hematoma/dislocation: Seen by orthopedics and now s/p reduction with external fixation and hematoma evacuation as well as skin graft placement by Dr. Sharol Given.  Also has wound VAC along the left knee skin graft.  Patient has been cleared for weightbearing as tolerated since 10/17 but has been limited by pain and anxiety while working with PT.  Continue pain medications as needed while monitoring mental status and respiratory status.  Advised patient the risks of CO2 narcosis and delirium with opiate overuse.  Currently on oxycodone 10 mg daily, 5 mg every 4 as needed and IV Dilaudid as needed.  Also on methocarbamol twice daily.Patient noted to been on Keflex since 10/19-?  Unclear reason.?  Discontinue  2.  Anasarca, pulmonary edema: Likely secondary to severe hypoalbuminemia (ALB 1.9-2.2 in this hospitalization) and fluid overload.  Patient was initially receiving IV Lasix with no significant results-changed to chlorthalidone after discussion with nephrology Dr. Joelyn Oms per hospitalist note from yesterday.  Chest x-ray obtained 10/22 showed marked pulmonary edema and patient received additional 60 mg IV Lasix with albumin infusion overnight with no significant change..  Also being continued on p.o. chlorthalidone  daily.  Echo from 07/03/2021 showed normal LV EF and normal RV function.  At this point patient with severe cardiorenal syndrome and would benefit from formal nephrology evaluation, diuretic adjustments and consideration for other options like ultrafiltrate removal.  Will obtain ABG in a.m. Requested pulmonary evaluation as well for NIPV recommendations--been using continuous CPAP while here.  3.  AKI on CKD stage IIIb: Patient's creatinine slowly going up to 1.5.  Baseline creatinine appears to be around 1.1-1.2. Patient also noted to have significant uremia with BUN going up to 99 indicating prerenal component.  IV diuretics likely causing intravascular depletion with minimal effect on third spacing.  Called nephrology, Dr. Melvia Heaps, for formal evaluation-since its late tonight, recommended to contact nephrologist on-call in a.m.   4.  Acute on chronic hypoxic/hypercarbic respiratory failure,Obstructive sleep apnea: Patient uses CPAP only at night at home.  Currently using continuously and also has nasal pressure wound from this.  Wife reports noting hypoxic episodes while visiting him at night.  Currently saturating well.  Did receive additional IV Lasix overnight.  Ordered ABG for a.m. Will avoid overdiuresis due to concern for problem #3.  Can use as needed if worsens overnight.  5.  Acute metabolic encephalopathy: Now resolved.  Opiate dosages adjusted 10/22.  Ativan discontinued and psychiatry adjusted meds-currently on Prozac 60 mg every afternoon.  Uremia could be contributing to lethargy as well.  Delirium precautions.  6.  Diabetes mellitus: Continue Lantus insulin 55 units and Premeal insulin, sliding scale.  Also on Actos.  7.  Hypertension: Will discontinue Norvasc given concerns for low BP with diuresis and chronic venous stasis/leg edema  8.  History of DVT /PE: On Coumadin and presented with supratherapeutic INR.  Not sure if he had provoked versus recurrent DVT warranting long-term  anticoagulation.  INR supratherapeutic today again at 4.6.  Received FFP on admission.  Will give vitamin K 1 dose.  9.  Acute on chronic normocytic anemia: Baseline hemoglobin around 10.  Patient required 2 units of PRBC and 1 unit FFP during this hospitalization.  10. Anxiety disorder: Continue Prozac every morning and hydroxyzine as needed  11. Nasal pressure wound: Continue Silvadene cream, p.o. zinc and foam dressing.    12.  Transient bradycardia/ 1st degree heart block/frequent PVCs Asymptomatic in context of known sleep apnea but occurs while awake. EKG significant for 1st degree AV block which is chronic.  Telemetry with heart rate down to high 30s while awake. Cardiology evaluated in earlier hospital course and recommended Zio patch at discharge.       13. Severe morbid obesity -Body mass index is 63.41 kg/m. Patient has been advised to make an attempt to improve diet and exercise patterns to aid in weight loss.PT recommended Kreg bed.   14. Physical deconditioning: Anxiety and lethargy has been limiting his ability to participate in PT.   DVT prophylaxis: Supratherapeutic INR Code Status: Full code Family / Patient Communication: Discussed with patient and wife at bedside in detail.  Discussed with nephrologist on-call-Dr. Jonnie Finner.  Please call nephrology  in a.m. for formal consultation.  Called PCCM and discussed with NP Audria Nine who will evaluate patient tonight or in a.m. Disposition Plan:   Status is: Inpatient   Remains inpatient appropriate because: Hemodynamic instability    Time spent: 65 minutes     >50% time spent in discussions with care team and coordination of care.  Discussed with patient and wife in detail.Called nephrology, pulmonary services.   Guilford Shi, MD [facility] Pager in [place]  If 7PM-7AM, please contact night-coverage [email or web address] 07/19/2021, 9:08 AM

## 2021-07-19 NOTE — Progress Notes (Signed)
Patient ID: Joseph [name]., male   DOB: 07-14-1956, 65 y.o.   MRN: [id number] The patient is status post external fixation of his unstable left knee as well as skin graft placement by my partner Dr. Sharol Given.  The skin graft was placed I believe around 6 days or so ago.  The patient and his family have been quite concerned about the amount of edema he has in his body all over.  It looks like from medical standpoint they are trying to get him to diuresis with Lasix.  There was concern yesterday that the [facility] that is over the left knee skin graft is becoming clogged.  I believe it is likely been on long enough and I did instruct the nurses that they can remove it and place Xeroform over the skin graft site and dry dressing.  I will communicate this with Dr. Sharol Given so he can round on the patient tomorrow and have further plans for wound care.

## 2021-07-19 NOTE — Progress Notes (Signed)
ANTICOAGULATION CONSULT NOTE - Follow Up Consult  Pharmacy Consult for Coumadin Indication: Hx DVT/PE   Allergies  Allergen Reactions   Tape     Peels skin on legs   Chlorhexidine Rash    Wipes    Patient Measurements: Height: 6\' 1"  (185.4 cm) Weight: (!) 218 kg (480 lb 9.6 oz) IBW/kg (Calculated) : 79.9 Heparin Dosing Weight:    Vital Signs: Temp: 97.9 F (36.6 C) (10/23 1200) Temp Source: Oral (10/23 1200) BP: 133/50 (10/23 1200) Pulse Rate: 76 (10/23 1200)  Labs: Recent Labs    07/17/21 0343 07/17/21 1635 07/18/21 0337 07/18/21 2128 07/19/21 0148  HGB 8.2*  --  7.6* 7.7* 7.8*  HCT 27.5*  --  25.1* 25.4* 26.9*  PLT 423*  --  405* 428* 441*  LABPROT 28.1*  --  37.6*  --  43.2*  INR 2.6*  --  3.8*  --  4.6*  CREATININE 1.35*  --  1.46* 1.58*  --   CKTOTAL  --  16*  --   --   --     Estimated Creatinine Clearance: 90.3 mL/min (A) (by C-G formula based on SCr of 1.58 mg/dL (H)).   Assessment: Anticoag: hep gtt; PTA Hx DVT/PE on Warf 7.5mg  Tu,Th,Sa; 5mg  other days (admit INR 6.2 on 9/15) -Left knee effusion/hematoma; warfarin held 9/15 - 9/22. Vitamin K 9/15 - 9/17. Warfarin given 9/23-10/2 (slow to get INR up) then held for surg. INR 3.8 >4.6 today. Hgb 7.8 low but stable.  Goal of Therapy:  INR 2-3 Monitor platelets by anticoagulation protocol: Yes   Plan:  Con't to hold Coumadin today. DAily INR   Izola Teague S. Alford Highland, PharmD, BCPS Clinical Staff Pharmacist [email or web address]  Alford Highland, [employer] 07/19/2021,12:32 PM

## 2021-07-20 ENCOUNTER — Inpatient Hospital Stay (HOSPITAL_COMMUNITY): Payer: HMO

## 2021-07-20 DIAGNOSIS — S83105A Unspecified dislocation of left knee, initial encounter: Secondary | ICD-10-CM | POA: Diagnosis not present

## 2021-07-20 LAB — BASIC METABOLIC PANEL
Anion gap: 12 (ref 5–15)
BUN: 104 mg/dL — ABNORMAL HIGH (ref 8–23)
CO2: 21 mmol/L — ABNORMAL LOW (ref 22–32)
Calcium: 8.8 mg/dL — ABNORMAL LOW (ref 8.9–10.3)
Chloride: 102 mmol/L (ref 98–111)
Creatinine, Ser: 1.77 mg/dL — ABNORMAL HIGH (ref 0.61–1.24)
GFR, Estimated: 42 mL/min — ABNORMAL LOW (ref >60)
Glucose, Bld: 230 mg/dL — ABNORMAL HIGH (ref 70–99)
Potassium: 4.8 mmol/L (ref 3.5–5.1)
Sodium: 135 mmol/L (ref 135–145)

## 2021-07-20 LAB — CBC
HCT: 26.5 % — ABNORMAL LOW (ref 39.0–52.0)
Hemoglobin: 7.7 g/dL — ABNORMAL LOW (ref 13.0–17.0)
MCH: 27.3 pg (ref 26.0–34.0)
MCHC: 29.1 g/dL — ABNORMAL LOW (ref 30.0–36.0)
MCV: 94 fL (ref 80.0–100.0)
Platelets: 450 10*3/uL — ABNORMAL HIGH (ref 150–400)
RBC: 2.82 MIL/uL — ABNORMAL LOW (ref 4.22–5.81)
RDW: 16.4 % — ABNORMAL HIGH (ref 11.5–15.5)
WBC: 7.2 10*3/uL (ref 4.0–10.5)
nRBC: 0.6 % — ABNORMAL HIGH (ref 0.0–0.2)

## 2021-07-20 LAB — URINALYSIS, COMPLETE (UACMP) WITH MICROSCOPIC
Bilirubin Urine: NEGATIVE
Glucose, UA: NEGATIVE mg/dL
Hgb urine dipstick: NEGATIVE
Ketones, ur: NEGATIVE mg/dL
Leukocytes,Ua: NEGATIVE
Nitrite: NEGATIVE
Protein, ur: NEGATIVE mg/dL
Specific Gravity, Urine: 1.016 (ref 1.005–1.030)
pH: 5 (ref 5.0–8.0)

## 2021-07-20 LAB — GLUCOSE, CAPILLARY
Glucose-Capillary: 210 mg/dL — ABNORMAL HIGH (ref 70–99)
Glucose-Capillary: 190 mg/dL — ABNORMAL HIGH (ref 70–99)
Glucose-Capillary: 170 mg/dL — ABNORMAL HIGH (ref 70–99)
Glucose-Capillary: 186 mg/dL — ABNORMAL HIGH (ref 70–99)

## 2021-07-20 LAB — PROTIME-INR
INR: 5.6 (ref 0.8–1.2)
Prothrombin Time: 50.9 seconds — ABNORMAL HIGH (ref 11.4–15.2)

## 2021-07-20 LAB — BLOOD GAS, ARTERIAL
Acid-base deficit: 1.6 mmol/L (ref 0.0–2.0)
Bicarbonate: 24.2 mmol/L (ref 20.0–28.0)
Drawn by: 511851
FIO2: 80
O2 Saturation: 98.6 %
Patient temperature: 37
pCO2 arterial: 53.3 mmHg — ABNORMAL HIGH (ref 32.0–48.0)
pH, Arterial: 7.279 — ABNORMAL LOW (ref 7.350–7.450)
pO2, Arterial: 115 mmHg — ABNORMAL HIGH (ref 83.0–108.0)

## 2021-07-20 MED ORDER — DARBEPOETIN ALFA 200 MCG/0.4ML IJ SOSY
200.0000 ug | PREFILLED_SYRINGE | INTRAMUSCULAR | Status: DC
  Administered 2021-07-20: 200 ug via SUBCUTANEOUS
  Filled 2021-07-20 (×2): qty 0.4

## 2021-07-20 MED ORDER — ALBUMIN HUMAN 25 % IV SOLN
25.0000 g | Freq: Three times a day (TID) | INTRAVENOUS | Status: AC
  Administered 2021-07-20 – 2021-07-22 (×6): 25 g via INTRAVENOUS
  Filled 2021-07-20 (×6): qty 100

## 2021-07-20 MED ORDER — FUROSEMIDE 10 MG/ML IJ SOLN
80.0000 mg | Freq: Three times a day (TID) | INTRAMUSCULAR | Status: AC
  Administered 2021-07-20 – 2021-07-22 (×6): 80 mg via INTRAVENOUS
  Filled 2021-07-20 (×6): qty 8

## 2021-07-20 MED ORDER — PHYTONADIONE 5 MG PO TABS
5.0000 mg | ORAL_TABLET | Freq: Once | ORAL | Status: AC
  Administered 2021-07-20: 5 mg via ORAL
  Filled 2021-07-20: qty 1

## 2021-07-20 NOTE — Progress Notes (Signed)
Nutrition Follow-up  DOCUMENTATION CODES:   Morbid obesity  INTERVENTION:   - Continue ProSource Plus 30 ml po BID, each supplement provides 100 kcal and 15 grams of protein   - Continue Ensure Max po daily, each supplement provides 150 kcal and 30 grams of protein   - Continue Glucerna Shake po daily, each supplement provides 220 kcal and 10 grams of protein   - Continue 1 packet Juven BID, each packet provides 95 calories, 2.5 grams of protein, and 9.8 grams of carbohydrate; also contains L-arginine and L-glutamine, vitamin C, vitamin E, vitamin B-12, zinc, calcium, and calcium Beta-hydroxy-Beta-methylbutyrate to support wound healing   - Continue MVI with minerals daily  NUTRITION DIAGNOSIS:   Inadequate oral intake related to decreased appetite as evidenced by per patient/family report.  Progressing, being addressed via oral nutrition supplements  GOAL:   Patient will meet greater than or equal to 90% of their needs  Progressing  MONITOR:   PO intake, Supplement acceptance, Labs, Weight trends, Skin  REASON FOR ASSESSMENT:   Consult Assessment of nutrition requirement/status  ASSESSMENT:   65 y.o. male with medical history of morbid obesity, anxiety, depression, hx L knee DVT, PE (on chronic Coumadin), GERD, HTN, IDA, cervical spine stenosis, non-specific myalgias/myositis, OSA on CPAP, PVD, polyneuropathy, type 2 DM, HLD, and multiple falls. He presented to the ED due to a fall on 9/15. He was admitted for pain control and to monitor L leg for development of compartment syndrome.  10/06 - s/p L knee extensive debridement, reduction of knee dislocation, external fixation spanning of knee dislocation, and application of wound VAC 10/12 - s/p L thigh debridement, application of wound VAC 10/14 - s/p L knee and thigh debridement, application of skin graft, application of wound VAC  Attempted to speak with pt at bedside. No family present and pt sleeping soundly. Pt did  not awaken to RD voice.  No meal completions documented since 10/18. Pt accepting most oral nutrition supplements per [facility] documentation. No new weights since 10/12. Pt with mild pitting generalized edema and deep pitting edema to BLE.  Plan is for pt to d/c to SNF, pending placement.  Medications reviewed and include: ProSource Plus BID, vitamin C 1000 mg daily, keflex, aranesp weekly, colace, Glucerna Shake daily, IV lasix, SSI, novolog 3 units TID with meals, semglee 55 units daily, MVI with minerals, Juven BID, protonix, actos, miralax, Ensure max daily, senna, warfarin, zinc sulfate 220 mg daily, IV albumin  Labs reviewed: sodium 134, BUN 113, creatinine 1.82, iron 25, hemoglobin 7.0 CBG's: 155-190 x 24 hours  UOP: 750 ml x 12 hours  Diet Order:   Diet Order             Diet Carb Modified Fluid consistency: Thin; Room service appropriate? Yes  Diet effective now                   EDUCATION NEEDS:   No education needs have been identified at this time  Skin:  Skin Assessment:  Skin Integrity Issues: Stage II: nose Incisions: L leg Other: venous stasis ulcers to R and L pretibial areas; open blister to L anterior thigh; skin tear to R buttock  Last BM:  07/21/21 large type 5  Height:   Ht Readings from Last 1 Encounters:  07/08/21 6\' 1"  (1.854 m)    Weight:   Wt Readings from Last 1 Encounters:  07/08/21 (!) 218 kg    Ideal Body Weight:  83.6 kg  BMI:  Body mass index is 63.41 kg/m.  Estimated Nutritional Needs:   Kcal:  2300-2500 kcal  Protein:  115-135 grams  Fluid:  >/= 2.4 L/day    Gustavus Bryant, MS, RD, LDN Inpatient Clinical Dietitian Please see AMiON for contact information.

## 2021-07-20 NOTE — Progress Notes (Addendum)
Dear Doctor: This patient has been identified as a candidate for PICC/CVC for the following reason (s): Extremely poor vasculature. Assessed with [place] bilateral arms. If needing further access please consider PICC/CVC. If you agree, please write an order for the indicated device.   Thank you for supporting the early vascular access assessment program.

## 2021-07-20 NOTE — Progress Notes (Signed)
PROGRESS NOTE    Joseph [name].  DDU:[id number] DOB: 05-24-1956 DOA: 06/11/2021 PCP: Donald Prose, MD    Brief Narrative:  65 year old gentleman with history of super morbid obesity, history of sleep apnea on CPAP, type 2 diabetes, hypertension, hyperlipidemia, peripheral artery disease, DVT on Coumadin, GERD, anxiety and depression and multiple falls presented to the emergency room on 9/15 after a fall at home resulting in left knee pain.  He was found to have a left knee dislocation and fracture fragments with left knee effusion.  Underwent close reduction in the ER and admitted to the hospital. 9/15, admitted with close reduction but continues pain. 9/30, MRI with significant ligamentous injury and lateral patellar dislocation.  Left knee extensive debridement, reduction of dislocation, external fixation with wound VAC application for chronic left thigh wound. X-rays of both I&D on 10/12 and 10/14-weightbearing as tolerated/17. Continues to have difficulty with pain management and anxiety.  Currently on Ativan. On CPAP for sleep apnea Also received 2 units of PRBC and 1 unit of FFP for supratherapeutic INR during hospital course. 10/22, noted shortness of breath on talking.  Hospitalization prolonged with severe physical deconditioning and remains with very poor respiratory status.   Assessment & Plan:   Principal Problem:   Left knee dislocation, initial encounter Active Problems:   Pure hypercholesterolemia   Essential hypertension, benign   OSA (obstructive sleep apnea)   Hx pulmonary embolism   Esophageal reflux   Diabetes mellitus type 2, uncontrolled   Diabetic neuropathy (HCC)   Acute renal failure superimposed on stage 3b chronic kidney disease (HCC)   Normocytic anemia   Class 3 obesity (HCC)   Aortic atherosclerosis (HCC)   Coronary atherosclerosis   Effusion of left knee   Left knee dislocation   Pressure injury of skin   Unstageable pressure ulcer of sacral  region ([place])   At risk for hemorrhage associated with anticoagulation therapy   Ulcer of left knee, with necrosis of bone (HCC)   Open knee wound, left, subsequent encounter   Moderate episode of recurrent major depressive disorder ([place])  Fall, left knee hematoma and dislocation: Surgical reduction with external fixation, hematoma evacuation and skin grafting by Dr. Haskell Riling management as per surgery. Weightbearing as tolerated.  Adequate pain medications. Currently on oxycodone IR 10 mg daily, 5 mg every 4 hours as needed and IV Dilaudid.  Also on Robaxin.  Still using IV pain medications for intermittent pain control.  Anasarca and pulmonary edema: Likely secondary to severe hypoalbuminemia and fluid overload.  Intermittently treated with IV Lasix with no significant results. Chest x-ray 10/22 with marked pulmonary edema treated with Lasix and albumin.  On p.o. chlorthalidone. Echocardiogram 10/7 with normal left ventricular ejection fraction and right ventricular function. ABG with metabolic acidosis.  Respiratory acidosis corrected by around-the-clock BiPAP. Repeat renal functions today.  Nephrology consultation requested.  Acute kidney injury on CKD stage IIIb: Baseline creatinine 1.1-1.2.  Renal functions pending from today.  Acute on chronic hypoxemic and hypercarbic respiratory failure, obstructive sleep apnea: Using CPAP at night.  He has become BiPAP dependent now.  Unsure recovery.  Acute metabolic encephalopathy: Resolved.  Ativan discontinued and currently on Prozac 60 mg.  Delirium precautions.  Type 2 diabetes: Controlled.  On Lantus and Premeal insulin.  Also on Actos.  Essential hypertension: Blood pressure is stable.  History of DVT/PE: On Coumadin and presented with supratherapeutic INR.  Followed by pharmacy for adjustment every day.  Anxiety disorder: On Prozac every morning and hydroxyzine  as needed.  Transient bradycardia and frequent PVCs: Asymptomatic in the  context of sleep apnea.  Severe morbid obesity: BMI 64.  Physical deconditioning anxiety and lethargy limiting his ability to participate in PT. Poor recovery due to severe morbid obesity, nonhealing wounds, pain and unable to come off the BiPAP.   DVT prophylaxis:   Therapeutic on Coumadin.   Code Status: Full code. Family Communication: None at bedside.  Will meet with family. Disposition Plan: Status is: Inpatient  Remains inpatient appropriate because: Around-the-clock BiPAP.  Unsafe discharge because of dependent on BiPAP, pain management challenges and mobility challenges.       Consultants:  Orthopedics Pulmonary critical care Nephrology  Procedures:  Multiple procedures applicable  Antimicrobials:  Multiple antibiotics, currently on Keflex   Subjective: Patient seen and examined.  He was on BiPAP.  He tells me that he has some pain on his legs 7-8 out of 10.  Denies any other complaints.  Denies any shortness of breath or chest pain. Bedside nursing staff were concerned about his inability to come off the BiPAP, examined at the bedside urgently and patient is currently comfortable. Ordered chest x-ray and reviewed that shows chronic interstitial edema with no new findings. Patient tells me he was able to sleep few hours at night.  Objective: Vitals:   07/19/21 2014 07/19/21 2329 07/20/21 0452 07/20/21 0737  BP: (!) 140/42 (!) 142/41 (!) 130/43 123/69  Pulse: 71 78 74 78  Resp: 15 18 18 20   Temp: (!) 97.4 F (36.3 C) (!) 97.4 F (36.3 C) 98.5 F (36.9 C) 97.6 F (36.4 C)  TempSrc: Oral Oral Axillary Axillary  SpO2: 96% 95% 100% 93%  Weight:      Height:        Intake/Output Summary (Last 24 hours) at 07/20/2021 1275 Last data filed at 07/19/2021 2010 Gross per 24 hour  Intake 340 ml  Output 500 ml  Net -160 ml   Filed Weights   06/25/21 0900 07/01/21 1518 07/08/21 0958  Weight: (!) 218 kg (!) 218 kg (!) 218 kg    Examination:  General exam:  Morbidly obese gentleman laying in bed on continuous BiPAP, looks comfortable.  Not in any distress.  He has some pressure wounds on the nasal bridge. Respiratory system: Poor bilateral air entry but no added sounds. Can hear some conducted upper airway sounds of the BiPAP. Cardiovascular system: S1 & S2 heard, RRR.  Gastrointestinal system: Soft.  Obese and pendulous.  Bowel sounds present.  Nontender.  Central nervous system: Alert and oriented. No focal neurological deficits.  Generalized weakness. Extremities:  Right lower extremity with chronic venous stasis changes and pigmentation, 2+ edema mostly chronic. Left lower extremity with big open wound anterior thigh with serosanguineous discharge, external fixator in place.  2+ edema.  Tender all along.    Data Reviewed: I have personally reviewed following labs and imaging studies  CBC: Recent Labs  Lab 07/17/21 0343 07/18/21 0337 07/18/21 2128 07/19/21 0148 07/20/21 0420  WBC 7.4 8.1 8.3 7.9 7.2  HGB 8.2* 7.6* 7.7* 7.8* 7.7*  HCT 27.5* 25.1* 25.4* 26.9* 26.5*  MCV 94.2 93.0 94.1 95.4 94.0  PLT 423* 405* 428* 441* 170*   Basic Metabolic Panel: Recent Labs  Lab 07/14/21 0336 07/15/21 0406 07/16/21 0401 07/17/21 0343 07/18/21 0337 07/18/21 2128  NA 135 129* 136 136 135 134*  K 5.5* 5.0 5.2* 5.5* 5.2* 5.4*  CL 105 100 105 106 105 104  CO2 24 22 22 24  21*  20*  GLUCOSE 164* 199* 149* 172* 193* 212*  BUN 73* 71* 77* 80* 91* 99*  CREATININE 1.31* 1.36* 1.25* 1.35* 1.46* 1.58*  CALCIUM 9.1 8.7* 8.9 9.1 9.0 8.8*  MG  --   --   --   --   --  2.2  PHOS 3.7 3.6 3.7 4.2 4.4  --    GFR: Estimated Creatinine Clearance: 90.3 mL/min (A) (by C-G formula based on SCr of 1.58 mg/dL (H)). Liver Function Tests: Recent Labs  Lab 07/15/21 0406 07/16/21 0401 07/17/21 0343 07/18/21 0337 07/18/21 2128  AST  --   --   --   --  16  ALT  --   --   --   --  7  ALKPHOS  --   --   --   --  86  BILITOT  --   --   --   --  1.0  PROT  --    --   --   --  5.9*  ALBUMIN 2.0* 2.0* 2.3* 2.4* 2.3*   No results for input(s): LIPASE, AMYLASE in the last 168 hours. No results for input(s): AMMONIA in the last 168 hours. Coagulation Profile: Recent Labs  Lab 07/16/21 0401 07/17/21 0343 07/18/21 0337 07/19/21 0148 07/20/21 0420  INR 2.4* 2.6* 3.8* 4.6* 5.6*   Cardiac Enzymes: Recent Labs  Lab 07/13/21 1304 07/17/21 1635  CKTOTAL 17* 16*   BNP (last 3 results) No results for input(s): PROBNP in the last 8760 hours. HbA1C: No results for input(s): HGBA1C in the last 72 hours. CBG: Recent Labs  Lab 07/19/21 0919 07/19/21 1159 07/19/21 1539 07/19/21 2102 07/20/21 0754  GLUCAP 234* 218* 211* 158* 210*   Lipid Profile: No results for input(s): CHOL, HDL, LDLCALC, TRIG, CHOLHDL, LDLDIRECT in the last 72 hours. Thyroid Function Tests: No results for input(s): TSH, T4TOTAL, FREET4, T3FREE, THYROIDAB in the last 72 hours. Anemia Panel: No results for input(s): VITAMINB12, FOLATE, FERRITIN, TIBC, IRON, RETICCTPCT in the last 72 hours. Sepsis Labs: No results for input(s): PROCALCITON, LATICACIDVEN in the last 168 hours.  No results found for this or any previous visit (from the past 240 hour(s)).       Radiology Studies: DG CHEST PORT 1 VIEW  Result Date: 07/20/2021 CLINICAL DATA:  66 year old male with shortness of breath. EXAM: PORTABLE CHEST 1 VIEW COMPARISON:  Portable chest 07/18/2021 and earlier. FINDINGS: Portable AP semi upright view at 0900 hours. Rotated to the left similar to before. Mildly larger lung volumes. Mediastinal contours are stable and within normal limits. Abnormal course bilateral pulmonary interstitial opacity. Less confluence of bilateral perihilar opacity. No pneumothorax, pleural effusion or air bronchograms. Paucity of bowel gas in the visible upper abdomen. No acute osseous abnormality identified. IMPRESSION: Larger lung volumes with ongoing course bilateral pulmonary interstitial  opacity. Bilateral perihilar opacity is less confluent. Top differential considerations are acute viral/atypical respiratory infection versus pulmonary edema. No pleural effusion is evident. Electronically Signed   By: Genevie Ann M.D.   On: 07/20/2021 09:09   DG CHEST PORT 1 VIEW  Result Date: 07/18/2021 CLINICAL DATA:  Shortness of breath. EXAM: PORTABLE CHEST 1 VIEW COMPARISON:  April 03, 2020 FINDINGS: Moderate to marked severity diffusely increased interstitial lung markings are seen, slightly more prominent within the perihilar regions, bilaterally. There is no evidence of a pleural effusion or pneumothorax. The heart size and mediastinal contours are within normal limits. The visualized skeletal structures are unremarkable. IMPRESSION: Moderate to marked severity pulmonary edema. Electronically  Signed   By: Virgina Norfolk M.D.   On: 07/18/2021 21:09        Scheduled Meds:  (feeding supplement) PROSource Plus  30 mL Oral BID BM   vitamin C  1,000 mg Oral Daily   cephALEXin  250 mg Oral QHS   chlorthalidone  50 mg Oral Daily   docusate sodium  100 mg Oral Daily   feeding supplement (GLUCERNA SHAKE)  237 mL Oral Q24H   FLUoxetine  60 mg Oral QPM   insulin aspart  0-20 Units Subcutaneous TID WC   insulin aspart  0-5 Units Subcutaneous QHS   insulin aspart  3 Units Subcutaneous TID WC   insulin glargine-yfgn  55 Units Subcutaneous QHS   methocarbamol  1,000 mg Oral BID   multivitamin with minerals  1 tablet Oral Daily   nutrition supplement (JUVEN)  1 packet Oral BID BM   oxyCODONE  10 mg Oral Daily   pantoprazole  40 mg Oral Daily   pioglitazone  45 mg Oral Daily   polyethylene glycol  17 g Oral BID   Ensure Max Protein  11 oz Oral Daily   senna  1 tablet Oral Daily   tamsulosin  0.4 mg Oral Daily   Warfarin - Pharmacist Dosing Inpatient   Does not apply q1600   zinc sulfate  220 mg Oral Daily   Continuous Infusions:  sodium chloride 10 mL/hr at 07/13/21 1107   methocarbamol  (ROBAXIN) IV       LOS: 38 days    Time spent: 34 minutes    Barb Merino, MD [facility] Pager [phone]

## 2021-07-20 NOTE — TOC Progression Note (Addendum)
Transition of Care (TOC) - Progression Note    Patient Details  Name: Joseph [name]. MRN: [id number] Date of Birth: [birth year]  Transition of Care [facility]) CM/SW Contact  Curlene Labrum, RN Phone Number: 07/20/2021, 9:35 AM  Clinical Narrative:    Case management met with the patient at the bedside to discuss transitions of care.  The patient states that he is frustrated at this time about wearing the Bipap mask and is waiting on RT to see him this morning.  I called Lauren, RN at the bedside and she states that RT was called to assess the patient at the bedside.  The patient is aware of skilled nursing facility offers in the hub but he states that he has not decided about transfer to a rehabilitation facility for post-hospitalization care at this time.  CM and MSW with DTP Team will continue to explore SNf options versus home with home health options for the patient once the patient is medically stable to discharge at later date.  CM spoke with the patient and offered exploration of other facilities in the area  - including [facility] and the patient was currently undecided at this time. CM asked that [facility] review the patient's clinicals as SNF option for the patient / family.   Expected Discharge Plan: Skilled Nursing Facility Barriers to Discharge: Continued Medical Work up (Plan for return to OR with Dr. Sharol Given on 10/14- I&D)  Expected Discharge Plan and Services Expected Discharge Plan: [place] In-house Referral: Clinical Social Work Discharge Planning Services: CM Consult Post Acute Care Choice: [place] arrangements for the past 2 months: Single Family Home                                       Social Determinants of Health (SDOH) Interventions    Readmission Risk Interventions Readmission Risk Prevention Plan 07/15/2021 07/09/2021  Transportation Screening Complete Complete  Medication Review  Press photographer) Complete Complete  PCP or Specialist appointment within 3-5 days of discharge Complete Complete  HRI or Home Care Consult Complete Complete  SW Recovery Care/Counseling Consult Complete Complete  Palliative Care Screening Complete -  [place] Complete Complete  Some recent data might be hidden

## 2021-07-20 NOTE — Progress Notes (Signed)
Patient ID: Joseph Wert., male   DOB: 06-11-1956, 65 y.o.   MRN: [id number] Patient's wound was evaluated, left thigh.  Patient had significant amount of drainage secondary to the lymphatic insufficiency.  I will change the Silvadene dressing to 3 times daily dry dressing changes.

## 2021-07-20 NOTE — Progress Notes (Signed)
Physical Therapy Treatment Patient Details Name: Joseph [name]. MRN: [id number] DOB: 14-Aug-1956 Today's Date: 07/20/2021   History of Present Illness Joseph [name] is a 65 yo male who presnets with increased LLE swelling after recent fall at home. 06/11/21 pt fell when getting off x-ray table at hospital. X-rays showed a knee dislocation which was confirmed on CT scan as a posterior lateral dislocation; L knee reduced and L KI placed. Lumbar and pelvic xrays negative. 10/5: left knee dislocation reduction with external fixation placement and extensive debridement of hematoma and devitalized skin subtenons tissue muscle fascia Placement of wound VAC; Wound debridement on 10/12. 10/14 L knee and thigh debridement with skin graft, now WBAT L LE.  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    PT Comments    Pt tolerates treatment well, reporting a desire to sit in chair position upon PT arrival. Pt is able to assist in sliding up to head of bed and tolerates chair position well. Pt participates in LE exercise to improve LE strength as well as Row exercise to improve core and UE strength and promote improved posture. PT goals updated according to pt progress.  Recommendations for follow up therapy are one component of a multi-disciplinary discharge planning process, led by the attending physician.  Recommendations may be updated based on patient status, additional functional criteria and insurance authorization.  Follow Up Recommendations  Skilled nursing-short term rehab (<3 hours/day)     Assistance Recommended at Discharge [facility] bed;Other (comment) (bariatric bed, bariatric hoyer lift, bariatric wheelchair [phone]")    Recommendations for Other Services       Precautions / Restrictions Precautions Precautions: Fall Precaution Comments: RLE external fixation;  anxiety Restrictions Weight Bearing Restrictions: Yes LLE Weight Bearing: Weight bearing as tolerated     Mobility  Bed Mobility Overal bed mobility: Needs Assistance             General bed mobility comments: pt is able to pull himself to top of bed with handles and minA +2 from flat bed    Transfers                        Ambulation/Gait                 Stairs             Wheelchair Mobility    Modified Rankin (Stroke Patients Only)       Balance                                            Cognition Arousal/Alertness: Awake/alert Behavior During Therapy: WFL for tasks assessed/performed Overall Cognitive Status: Within Functional Limits for tasks assessed                                          Exercises General Exercises - Lower Extremity Ankle Circles/Pumps: AROM;Both;20 reps Gluteal Sets: AROM;Both;20 reps Short Arc Quad: AROM;Right;15 reps Heel Slides: AROM;Right;5 reps Other Exercises Other Exercises: Pt performs 5 rows from chair position in bed, holding one railing with LUE and hand hold of PT with RUE    General Comments General comments (skin integrity, edema, etc.): VSS on  RA      Pertinent Vitals/Pain Pain Assessment: 0-10 Pain Score: 7  Pain Location: L knee Pain Descriptors / Indicators: Grimacing Pain Intervention(s): Monitored during session    Home Living                          Prior Function            PT Goals (current goals can now be found in the care plan section) Acute Rehab PT Goals Patient Stated Goal: to help move as much as possible PT Goal Formulation: With patient Time For Goal Achievement: 08/03/21 Potential to Achieve Goals: Fair Progress towards PT goals: Progressing toward goals    Frequency    Min 3X/week      PT Plan Current plan remains appropriate    Co-evaluation              AM-PAC PT "6 Clicks" Mobility    Outcome Measure  Help needed turning from your back to your side while in a flat bed without using bedrails?: Total Help needed moving from lying on your back to sitting on the side of a flat bed without using bedrails?: Total Help needed moving to and from a bed to a chair (including a wheelchair)?: Total Help needed standing up from a chair using your arms (e.g., wheelchair or bedside chair)?: Total Help needed to walk in hospital room?: Total Help needed climbing 3-5 steps with a railing? : Total 6 Click Score: 6    End of Session Equipment Utilized During Treatment: Oxygen Activity Tolerance: Patient tolerated treatment well Patient left: in bed;with call bell/phone within reach Nurse Communication: Need for lift equipment;Mobility status PT Visit Diagnosis: Other abnormalities of gait and mobility (R26.89);Muscle weakness (generalized) (M62.81);History of falling (Z91.81);Repeated falls (R29.6);Pain Pain - Right/Left: Left Pain - part of body: Knee;Leg     Time: 9728-2060 PT Time Calculation (min) (ACUTE ONLY): 32 min  Charges:  $Therapeutic Exercise: 23-37 mins                     Zenaida Niece, PT, DPT Acute Rehabilitation Pager: [phone] Office [place] 07/20/2021, 12:45 PM

## 2021-07-20 NOTE — Progress Notes (Signed)
ANTICOAGULATION CONSULT NOTE - Follow Up Consult  Pharmacy Consult for Warfarin Indication: h/o pulmonary embolus  Allergies  Allergen Reactions   Tape     Peels skin on legs   Chlorhexidine Rash    Wipes    Patient Measurements: Height: 6\' 1"  (185.4 cm) Weight: (!) 218 kg (480 lb 9.6 oz) IBW/kg (Calculated) : 79.9  Vital Signs: Temp: 97.9 F (36.6 C) (10/24 1144) Temp Source: Oral (10/24 1144) BP: 133/52 (10/24 1144) Pulse Rate: 77 (10/24 1144)  Labs: Recent Labs    07/17/21 1635 07/18/21 0337 07/18/21 0337 07/18/21 2128 07/19/21 0148 07/20/21 0420 07/20/21 0946  HGB  --  7.6*   < > 7.7* 7.8* 7.7*  --   HCT  --  25.1*   < > 25.4* 26.9* 26.5*  --   PLT  --  405*   < > 428* 441* 450*  --   LABPROT  --  37.6*  --   --  43.2* 50.9*  --   INR  --  3.8*  --   --  4.6* 5.6*  --   CREATININE  --  1.46*  --  1.58*  --   --  1.77*  CKTOTAL 16*  --   --   --   --   --   --    < > = values in this interval not displayed.     Estimated Creatinine Clearance: 80.6 mL/min (A) (by C-G formula based on SCr of 1.77 mg/dL (H)).   Medications:  PTA Warfarin:  5 mg on Sun/Mon/Wed/Fri evening, and 7.5 mg on Tue/Thur/Sat evening.   INR was supratherapeutic (6.2) at admission  Assessment: 34 YOM on lifelong warfarin therapy for hx of PE, body habitus, and sedentary lifestyle admitted on 9/15 with left knee dislocation and effusion concerning for hematoma. At admission, warfarin was held, vitamin K was given for supratherapeutic INR (6.2), and pt was transfused.   Patient resumed on warfarin, but doses have been held since 10/21 due to SUPRAtherapeutic INR, up to 5.6 today. S/p vitamin K 5mg  IV x 1 on 10/23 and 10/24. CBC stable. No bleed issues reported.  Goal of Therapy:  INR 2-3 Monitor platelets by anticoagulation protocol: Yes   Plan:  Hold Warfarin dose again today Monitor daily INR, CBC, s/sx bleeding   Arturo Morton, PharmD, BCPS Please check AMION for all [place] contact numbers Clinical Pharmacist 07/20/2021 2:57 PM

## 2021-07-20 NOTE — Plan of Care (Signed)

## 2021-07-20 NOTE — Progress Notes (Addendum)
Inpatient Diabetes Program Recommendations  AACE/ADA: New Consensus Statement on Inpatient Glycemic Control (2015)  Target Ranges:  Prepandial:   less than 140 mg/dL      Peak postprandial:   less than 180 mg/dL (1-2 hours)      Critically ill patients:  140 - 180 mg/dL   Lab Results  Component Value Date   GLUCAP 190 (H) 07/20/2021   HGBA1C 6.1 (H) 06/18/2021    Review of Glycemic Control Results for Joseph [name], Joseph [name] (MRN [id number]) as of 07/20/2021 14:19  Ref. Range 07/19/2021 21:02 07/20/2021 07:54 07/20/2021 11:43  Glucose-Capillary Latest Ref Range: 70 - 99 mg/dL 158 (H) 210 (H) 190 (H)   Diabetes history: Type 2 DM Outpatient Diabetes medications: Novolog 0-25 units PRN, Actos 45 mg QD, Metformin 1000 mg BID Current orders for Inpatient glycemic control: Novolog 0-20 units TID, Novolog 0-5 units QHS, Novolog 3 units TID, Semglee 55 units QHS  Inpatient Diabetes Program Recommendations:    Consider increasing Semglee to 62 units QHS.  Would be cautious in the use of Actos in the setting of pulmonary edema.   Thanks, Bronson Curb, MSN, RNC-OB Diabetes Coordinator [phone] (8a-5p)

## 2021-07-20 NOTE — Consult Note (Signed)
[facility] Renal Consultation Note  Requesting MD: Joseph [name] Indication for Consultation: CKD-  volume overload  HPI:  Joseph [name]. is a 65 y.o. male with past medical history significant for type 2 DM, HTN, OSA, PAD, hx of PE on coumadin, possible lymphedema and very limited mobility it seems, morbid obesity.  He was admitted on 9/15 after a fall resulting in a knee dislocation and thigh wound that has been managed by Dr. Sharol Given in house with a few operations- has external fixator.  Disposition has been difficult-  not approp for inpatient rehab-  needing SNF.  He reports that he has struggled for years with excess fluid/lymphedema ?  But says since here, he feels his swelling is worse. Baseline crt seems to be around 1.2-  has worsened some over the last week-  crt 1.77 today but also BUN has increased now to 104 and that is the reason for consult.  Patient is alert and friendly-  does not seem to be uremic-  just very limited in his mobility.  His blood pressure has not been recorded as obviously low.  He was on ACE but that was stopped early on.  He has been treated intermittently with lasix-  UOP trend seems to be down.  He has had a contrasted CT but that was a while ago. Urinalysis on 10/21 really unremarkable- no recent renal imaging-  collecting urine via a purewick type cath. Last albumin 2.3.  has had intermittent high K's medically treated   Creatinine, Ser  Date/Time Value Ref Range Status  07/20/2021 09:46 AM 1.77 (H) 0.61 - 1.24 mg/dL Final  07/18/2021 09:28 PM 1.58 (H) 0.61 - 1.24 mg/dL Final  07/18/2021 03:37 AM 1.46 (H) 0.61 - 1.24 mg/dL Final  07/17/2021 03:43 AM 1.35 (H) 0.61 - 1.24 mg/dL Final  07/16/2021 04:01 AM 1.25 (H) 0.61 - 1.24 mg/dL Final  07/15/2021 04:06 AM 1.36 (H) 0.61 - 1.24 mg/dL Final  07/14/2021 03:36 AM 1.31 (H) 0.61 - 1.24 mg/dL Final  07/13/2021 04:32 AM 1.59 (H) 0.61 - 1.24 mg/dL Final  07/12/2021 02:45 AM 1.83 (H) 0.61 - 1.24 mg/dL Final   07/11/2021 02:50 AM 1.55 (H) 0.61 - 1.24 mg/dL Final  07/10/2021 01:15 AM 1.47 (H) 0.61 - 1.24 mg/dL Final  07/09/2021 01:35 AM 1.13 0.61 - 1.24 mg/dL Final  07/08/2021 01:37 AM 1.21 0.61 - 1.24 mg/dL Final  07/07/2021 07:44 AM 1.35 (H) 0.61 - 1.24 mg/dL Final  07/06/2021 01:57 AM 1.73 (H) 0.61 - 1.24 mg/dL Final  07/05/2021 02:59 AM 2.09 (H) 0.61 - 1.24 mg/dL Final  07/04/2021 01:18 AM 2.37 (H) 0.61 - 1.24 mg/dL Final  07/03/2021 06:27 PM 2.23 (H) 0.61 - 1.24 mg/dL Final  07/01/2021 04:46 AM 1.17 0.61 - 1.24 mg/dL Final  06/24/2021 05:07 AM 1.24 0.61 - 1.24 mg/dL Final  06/23/2021 04:11 AM 1.26 (H) 0.61 - 1.24 mg/dL Final  06/20/2021 12:14 AM 1.32 (H) 0.61 - 1.24 mg/dL Final  06/19/2021 03:34 AM 1.35 (H) 0.61 - 1.24 mg/dL Final  06/18/2021 03:40 AM 1.52 (H) 0.61 - 1.24 mg/dL Final  06/17/2021 03:25 AM 1.65 (H) 0.61 - 1.24 mg/dL Final  06/16/2021 03:39 AM 1.93 (H) 0.61 - 1.24 mg/dL Final  06/15/2021 02:56 AM 2.57 (H) 0.61 - 1.24 mg/dL Final  06/14/2021 03:30 AM 2.87 (H) 0.61 - 1.24 mg/dL Final  06/13/2021 03:25 AM 2.19 (H) 0.61 - 1.24 mg/dL Final  06/12/2021 05:58 AM 1.43 (H) 0.61 - 1.24 mg/dL Final  06/11/2021 05:08 PM  1.50 (H) 0.61 - 1.24 mg/dL Final  06/11/2021 05:00 PM 1.52 (H) 0.61 - 1.24 mg/dL Final  06/08/2021 11:12 AM 1.40 (H) 0.61 - 1.24 mg/dL Final  04/05/2020 03:38 AM 1.64 (H) 0.61 - 1.24 mg/dL Final  04/04/2020 06:45 AM 1.58 (H) 0.61 - 1.24 mg/dL Final  04/03/2020 02:28 AM 1.65 (H) 0.61 - 1.24 mg/dL Final  02/15/2020 05:32 PM 1.62 (H) 0.61 - 1.24 mg/dL Final  12/07/2015 05:55 PM 1.19 0.61 - 1.24 mg/dL Final  12/07/2015 04:41 PM 1.10 0.61 - 1.24 mg/dL Final  01/07/2015 05:28 AM 1.14 0.50 - 1.35 mg/dL Final  01/05/2015 05:00 AM 1.62 (H) 0.50 - 1.35 mg/dL Final  01/04/2015 05:14 PM 1.87 (H) 0.50 - 1.35 mg/dL Final  01/03/2015 04:15 AM 2.16 (H) 0.50 - 1.35 mg/dL Final  01/02/2015 05:02 AM 1.76 (H) 0.50 - 1.35 mg/dL Final  01/01/2015 05:15 AM 1.52 (H) 0.50 - 1.35 mg/dL  Final  12/30/2014 08:33 PM 1.38 (H) 0.50 - 1.35 mg/dL Final  12/05/2014 11:05 AM 1.10 0.50 - 1.35 mg/dL Final  07/17/2014 04:43 AM 1.00 0.50 - 1.35 mg/dL Final  07/16/2014 05:50 AM 1.20 0.50 - 1.35 mg/dL Final  07/15/2014 01:10 PM 1.50 (H) 0.50 - 1.35 mg/dL Final  07/08/2014 11:50 AM 1.09 0.50 - 1.35 mg/dL Final  07/03/2014 12:50 PM 0.99 0.50 - 1.35 mg/dL Final     PMHx:   Past Medical History:  Diagnosis Date   Anxiety state, unspecified    Aortic atherosclerosis ([place]) 06/12/2021   Depression    DVT (deep venous thrombosis) ([place]) 2004   Left knee   Edema    Esophageal reflux    occ   Essential hypertension, benign    Essential hypertension, benign    Falls frequently    History of iron deficiency anemia    Morbid obesity (HCC)    Myalgia and myositis, unspecified    BACK AND LEGS   OSA on CPAP    Peripheral vascular disease, unspecified ([place])    Personal history of PE (pulmonary embolism) 2004   Polyneuropathy in diabetes(357.2)    Poor venous access    PT STATES DIFFICULT TO DRAW BLOOD FROM HIS VEINS   Pure hypercholesterolemia    Shortness of breath    DUE TO WEIGHT AND LOSS OF MOBILITY   Type II or unspecified type diabetes mellitus with neurological manifestations, not stated as uncontrolled(250.60)    Type II or unspecified type diabetes mellitus without mention of complication, not stated as uncontrolled    Type II or unspecified type diabetes mellitus without mention of complication, uncontrolled    Varicose veins of lower extremities with inflammation    Wears glasses    Wound infection after surgery    required hospitalization    Past Surgical History:  Procedure Laterality Date   AMPUTATION TOE Bilateral 04/03/2020   Procedure: BILATERAL HALLUX AMPUTATION;  Surgeon: Felipa Furnace, DPM;  Location: [facility];  Service: Podiatry;  Laterality: Bilateral;   COLONOSCOPY WITH PROPOFOL N/A 07/20/2019   Procedure: COLONOSCOPY WITH PROPOFOL;  Surgeon: Wonda Horner,  MD;  [facility];  Service: Endoscopy;  Laterality: N/A;   HEMATOMA EVACUATION Left 07/01/2021   Procedure: LEFT KNEE REDUCTION EXTERNAL FIXATION WITH EVACUATION HEMATOMA;  Surgeon: Meredith Pel, MD;  [facility];  Service: Orthopedics;  Laterality: Left;   I & D EXTREMITY Bilateral 04/05/2020   Procedure: IRRIGATION AND DEBRIDEMENT EXTREMITY WITH CLOSURE - BILATERALLY;  Surgeon: Felipa Furnace, DPM;  [facility];  Service: Podiatry;  Laterality: Bilateral;   I & D EXTREMITY Left 07/08/2021   Procedure: LEFT THIGH DEBRIDEMENT;  Surgeon: Newt Minion, MD;  Location: [facility];  Service: Orthopedics;  Laterality: Left;   I & D EXTREMITY Left 07/10/2021   Procedure: LEFT THIGH DEBRIDEMENT;  Surgeon: Newt Minion, MD;  Location: [facility];  Service: Orthopedics;  Laterality: Left;   NO PAST SURGERIES     PANNICULECTOMY N/A 07/15/2014   Procedure: PANNICULECTOMY;  Surgeon: Pedro Earls, MD;  [facility];  Service: General;  Laterality: N/A;   POLYPECTOMY  07/20/2019   Procedure: POLYPECTOMY;  Surgeon: Wonda Horner, MD;  [facility];  Service: Endoscopy;;   SKIN SPLIT GRAFT Left 07/10/2021   Procedure: APPLY SKIN GRAFT;  Surgeon: Newt Minion, MD;  Location: [facility];  Service: Orthopedics;  Laterality: Left;    Family Hx:  Family History  Problem Relation Age of Onset   Hypertension Father    Pulmonary embolism Father    Hypertension Other     Social History:  reports that he has never smoked. He has never used smokeless tobacco. He reports that he does not currently use alcohol. He reports that he does not use drugs.  Allergies:  Allergies  Allergen Reactions   Tape     Peels skin on legs   Chlorhexidine Rash    Wipes    Medications: Prior to Admission medications   Medication Sig Start Date End Date Taking? Authorizing Provider  acetaminophen (TYLENOL) 500 MG tablet Take 1,000 mg by mouth daily as needed for moderate pain or headache.     Yes [provider]  amLODipine (NORVASC) 10 MG tablet Take 10 mg by mouth every evening.    Yes [provider]  cephALEXin (KEFLEX) 250 MG capsule Take 250 mg by mouth at bedtime. 06/05/21  Yes [provider]  FLUoxetine (PROZAC) 40 MG capsule Take 40 mg by mouth every evening.    Yes [provider]  furosemide (LASIX) 40 MG tablet Take 40 mg by mouth daily.    Yes [provider]  indapamide (LOZOL) 2.5 MG tablet Take 5 mg by mouth daily.   Yes [provider]  insulin aspart (NOVOLOG) 100 UNIT/ML injection Inject 0-25 Units into the skin as needed for high blood sugar. Sliding Scale 06/08/21  Yes [provider]  LANTUS 100 UNIT/ML injection Inject 50 Units into the skin at bedtime. 06/08/21  Yes [provider]  metFORMIN (GLUCOPHAGE) 1000 MG tablet Take 1,000 mg by mouth 2 (two) times daily with a meal.   Yes [provider]  omeprazole (PRILOSEC) 40 MG capsule Take 40 mg by mouth daily as needed (acid reflux).    Yes [provider]  pioglitazone (ACTOS) 45 MG tablet Take 45 mg by mouth daily.   Yes [provider]  pravastatin (PRAVACHOL) 40 MG tablet Take 40 mg by mouth every evening.    Yes [provider]  Prenatal Vit-Fe Fumarate-FA (PRENATAL FA PO) Take 1 tablet by mouth daily.   Yes [provider]  quinapril (ACCUPRIL) 40 MG tablet Take 40 mg by mouth daily.    Yes [provider]  silver sulfADIAZINE (SILVADENE) 1 % cream Apply 1 application topically daily. Patient taking differently: Apply 1 application topically daily as needed (wounds on toes and legs). 02/26/20  Yes Early, Arvilla Meres, MD  tamsulosin (FLOMAX) 0.4 MG CAPS capsule Take 0.4 mg by mouth daily. 04/02/21  Yes  [provider]  warfarin (COUMADIN) 5 MG tablet Take 5-7.5 mg by mouth See admin instructions. Take 5 mg in the evening on Sun, Mon, Wed, and Fri. Take 7.5 mg in the evening on Tue, Thur,  and Sat   Yes [provider]  Wheat Dextrin (BENEFIBER) POWD Take 15 mLs by mouth daily as needed (constipation). Mix in coffee and drink   Yes [provider]  Continuous Blood Gluc Sensor (FREESTYLE LIBRE 2 SENSOR) MISC APPLY EVERY 14 DAYS AS DIRECTED 03/28/20   [provider]  FREESTYLE LITE test strip TEST BLOOD SUGAR FOUR TIMES DAILY 02/11/20   [provider]    I have reviewed the patient's current medications.  Labs:  Results for orders placed or performed during the hospital encounter of 06/11/21 (from the past 48 hour(s))  Glucose, capillary     Status: Abnormal   Collection Time: 07/18/21  5:15 PM  Result Value Ref Range   Glucose-Capillary 208 (H) 70 - 99 mg/dL    Comment: Glucose reference range applies only to samples taken after fasting for at least 8 hours.  CBC     Status: Abnormal   Collection Time: 07/18/21  9:28 PM  Result Value Ref Range   WBC 8.3 4.0 - 10.5 K/uL   RBC 2.70 (L) 4.22 - 5.81 MIL/uL   Hemoglobin 7.7 (L) 13.0 - 17.0 g/dL   HCT 25.4 (L) 39.0 - 52.0 %   MCV 94.1 80.0 - 100.0 fL   MCH 28.5 26.0 - 34.0 pg   MCHC 30.3 30.0 - 36.0 g/dL   RDW 16.6 (H) 11.5 - 15.5 %   Platelets 428 (H) 150 - 400 K/uL   nRBC 0.2 0.0 - 0.2 %    Comment: Performed at [facility] [address], [place], [place] [address]  Magnesium     Status: None   Collection Time: 07/18/21  9:28 PM  Result Value Ref Range   Magnesium 2.2 1.7 - 2.4 mg/dL    Comment: Performed at [facility], [place] [address], [place], [place] [address]  Comprehensive metabolic panel     Status: Abnormal   Collection Time: 07/18/21  9:28 PM  Result Value Ref Range   Sodium 134 (L) 135 - 145 mmol/L   Potassium 5.4 (H) 3.5 - 5.1 mmol/L   Chloride 104 98 - 111 mmol/L   CO2 20 (L) 22 - 32 mmol/L   Glucose, Bld 212 (H) 70 - 99 mg/dL    Comment: Glucose reference range applies only to samples taken after fasting for at least 8 hours.   BUN 99 (H) 8 - 23 mg/dL    Creatinine, Ser 1.58 (H) 0.61 - 1.24 mg/dL   Calcium 8.8 (L) 8.9 - 10.3 mg/dL   Total Protein 5.9 (L) 6.5 - 8.1 g/dL   Albumin 2.3 (L) 3.5 - 5.0 g/dL   AST 16 15 - 41 U/L   ALT 7 0 - 44 U/L   Alkaline Phosphatase 86 38 - 126 U/L   Total Bilirubin 1.0 0.3 - 1.2 mg/dL   GFR, Estimated 49 (L) >60 mL/min    Comment: (NOTE) Calculated using the CKD-EPI Creatinine Equation (2021)    Anion gap 10 5 - 15    Comment: Performed at [facility], [place] [address], [place], [place] [address]  Blood gas, arterial     Status: Abnormal   Collection Time: 07/18/21  9:53 PM  Result Value Ref Range  FIO2 76.00    pH, Arterial 7.328 (L) 7.350 - 7.450   pCO2 arterial 46.5 32.0 - 48.0 mmHg   pO2, Arterial 59.7 (L) 83.0 - 108.0 mmHg   Bicarbonate 23.7 20.0 - 28.0 mmol/L   Acid-base deficit 1.4 0.0 - 2.0 mmol/L   O2 Saturation 90.5 %   Patient temperature 37.0    Collection site LEFT RADIAL    Drawn by TN    Sample type ARTERIAL DRAW    Allens test (pass/fail) PASS PASS    Comment: Performed at [facility], [place] [address], [place], [place] [address]  Glucose, capillary     Status: Abnormal   Collection Time: 07/18/21 10:30 PM  Result Value Ref Range   Glucose-Capillary 188 (H) 70 - 99 mg/dL    Comment: Glucose reference range applies only to samples taken after fasting for at least 8 hours.  Protime-INR     Status: Abnormal   Collection Time: 07/19/21  1:48 AM  Result Value Ref Range   Prothrombin Time 43.2 (H) 11.4 - 15.2 seconds   INR 4.6 (HH) 0.8 - 1.2    Comment: REPEATED TO VERIFY CRITICAL RESULT CALLED TO, READ BACK BY AND VERIFIED WITH: S HANSON RN ON 07/19/21@0336  BY CHAYES Performed at [facility], [place] [address], [place], [place] [address]   CBC     Status: Abnormal   Collection Time: 07/19/21  1:48 AM  Result Value Ref Range   WBC 7.9 4.0 - 10.5 K/uL   RBC 2.82 (L) 4.22 - 5.81 MIL/uL   Hemoglobin 7.8 (L) 13.0 - 17.0 g/dL   HCT 26.9 (L) 39.0 - 52.0 %   MCV  95.4 80.0 - 100.0 fL   MCH 27.7 26.0 - 34.0 pg   MCHC 29.0 (L) 30.0 - 36.0 g/dL   RDW 16.6 (H) 11.5 - 15.5 %   Platelets 441 (H) 150 - 400 K/uL   nRBC 0.3 (H) 0.0 - 0.2 %    Comment: Performed at [facility] [address], [place], [place] [address]  Glucose, capillary     Status: Abnormal   Collection Time: 07/19/21  9:19 AM  Result Value Ref Range   Glucose-Capillary 234 (H) 70 - 99 mg/dL    Comment: Glucose reference range applies only to samples taken after fasting for at least 8 hours.   Comment 1 Notify RN    Comment 2 Document in Chart   Glucose, capillary     Status: Abnormal   Collection Time: 07/19/21 11:59 AM  Result Value Ref Range   Glucose-Capillary 218 (H) 70 - 99 mg/dL    Comment: Glucose reference range applies only to samples taken after fasting for at least 8 hours.  Glucose, capillary     Status: Abnormal   Collection Time: 07/19/21  3:39 PM  Result Value Ref Range   Glucose-Capillary 211 (H) 70 - 99 mg/dL    Comment: Glucose reference range applies only to samples taken after fasting for at least 8 hours.   Comment 1 Notify RN    Comment 2 Document in Chart   Glucose, capillary     Status: Abnormal   Collection Time: 07/19/21  9:02 PM  Result Value Ref Range   Glucose-Capillary 158 (H) 70 - 99 mg/dL    Comment: Glucose reference range applies only to samples taken after fasting for at least 8 hours.  Protime-INR     Status: Abnormal   Collection Time: 07/20/21  4:20  AM  Result Value Ref Range   Prothrombin Time 50.9 (H) 11.4 - 15.2 seconds   INR 5.6 (HH) 0.8 - 1.2    Comment: REPEATED TO VERIFY CRITICAL RESULT CALLED TO, READ BACK BY AND VERIFIED WITH: S HANSON,RN 07/20/2021 0516 WILDERK (NOTE) INR goal varies based on device and disease states. Performed at [facility], [place] [address], [place], [place] [address]   CBC     Status: Abnormal   Collection Time: 07/20/21  4:20 AM  Result Value Ref Range   WBC 7.2 4.0 - 10.5 K/uL   RBC  2.82 (L) 4.22 - 5.81 MIL/uL   Hemoglobin 7.7 (L) 13.0 - 17.0 g/dL   HCT 26.5 (L) 39.0 - 52.0 %   MCV 94.0 80.0 - 100.0 fL   MCH 27.3 26.0 - 34.0 pg   MCHC 29.1 (L) 30.0 - 36.0 g/dL   RDW 16.4 (H) 11.5 - 15.5 %   Platelets 450 (H) 150 - 400 K/uL   nRBC 0.6 (H) 0.0 - 0.2 %    Comment: Performed at [facility] [address] [place], [place] [address]  Blood gas, arterial     Status: Abnormal   Collection Time: 07/20/21  7:22 AM  Result Value Ref Range   FIO2 80.00    pH, Arterial 7.279 (L) 7.350 - 7.450   pCO2 arterial 53.3 (H) 32.0 - 48.0 mmHg   pO2, Arterial 115 (H) 83.0 - 108.0 mmHg   Bicarbonate 24.2 20.0 - 28.0 mmol/L   Acid-base deficit 1.6 0.0 - 2.0 mmol/L   O2 Saturation 98.6 %   Patient temperature 37.0    Collection site RIGHT RADIAL    Drawn by [id number]    Sample type ARTERIAL DRAW    Allens test (pass/fail) PASS PASS    Comment: Performed at [facility], [place] [address], [place], [place] [address]  Glucose, capillary     Status: Abnormal   Collection Time: 07/20/21  7:54 AM  Result Value Ref Range   Glucose-Capillary 210 (H) 70 - 99 mg/dL    Comment: Glucose reference range applies only to samples taken after fasting for at least 8 hours.  Basic metabolic panel     Status: Abnormal   Collection Time: 07/20/21  9:46 AM  Result Value Ref Range   Sodium 135 135 - 145 mmol/L   Potassium 4.8 3.5 - 5.1 mmol/L   Chloride 102 98 - 111 mmol/L   CO2 21 (L) 22 - 32 mmol/L   Glucose, Bld 230 (H) 70 - 99 mg/dL    Comment: Glucose reference range applies only to samples taken after fasting for at least 8 hours.   BUN 104 (H) 8 - 23 mg/dL   Creatinine, Ser 1.77 (H) 0.61 - 1.24 mg/dL   Calcium 8.8 (L) 8.9 - 10.3 mg/dL   GFR, Estimated 42 (L) >60 mL/min    Comment: (NOTE) Calculated using the CKD-EPI Creatinine Equation (2021)    Anion gap 12 5 - 15    Comment: Performed at [facility] [address], [place], [place] [address]  Glucose, capillary      Status: Abnormal   Collection Time: 07/20/21 11:43 AM  Result Value Ref Range   Glucose-Capillary 190 (H) 70 - 99 mg/dL    Comment: Glucose reference range applies only to samples taken after fasting for at least 8 hours.     ROS:  A comprehensive review of systems was negative except for:  Constitutional: positive for malaise Respiratory: positive for dyspnea on exertion Musculoskeletal: positive for arthralgias, bone pain, and muscle weakness  Physical Exam: Vitals:   07/20/21 0737 07/20/21 1144  BP: 123/69 (!) 133/52  Pulse: 78 77  Resp: 20 20  Temp: 97.6 F (36.4 C) 97.9 F (36.6 C)  SpO2: 93% 93%     General: alert, on bipap-  not uremic-  actually pleasant HEENT: PERRLA, EOMI Neck: positive for JVD Heart: RRR Lungs: dec BS at bases Abdomen: obese-  abdominal wall edema Extremities: pitting edema-  brusing-  external fixator on left LE Skin: mult abrasions/ ecchymoses Neuro: alert, non focal   Assessment/Plan: 65 year old WM with many medical issues admitted after a fall req surgical management of LLE-  hosp complicated by extremely limited mobility-  has developed some A on CRF with BUN out of proprortion to crt-  refractory so it seems edema  1.Renal- some A on CRF esp with high BUN.  This is a very complicated situation.  I cannot determine an etiology for his changed renal function.  Will check u/s and another U/A-  all nephrotoxic meds have been discontinued.  Need to try and maximize renal perfusion so limiting what we can in terms of pain meds and muscle relaxers-  have stopped his prn hydralazine-  if u/s does not show obstruction I may need to stop his flomax.  Performing dialysis in this gentleman would be very problematic due to his size and his limited mobility and it is doubtful that he would get to a point where he would be appropriate for OP HD 2. Hypertension/volume  - very volume overloaded-  likely third spacing but also is overloaded-  will try to use an  albumin/lasix combo to attempt some diuresis mostly so that he can be liberated from the CPAP.  Amazing that his echo earlier in the month did not reveal any right sided issues  3. Anemia  - not helping his volume status-  will dose with ESA and check iron stores   Louis Meckel 07/20/2021, 12:51 PM

## 2021-07-20 NOTE — Progress Notes (Signed)
Pt placed on dreamstation CPAP setting of 15. O2 bleed in of 10L slowly being weaned down. Pt is unable to handle bipap/bilevel settings on dreamstation. Pt is more comfortably with O2 sat between 95-98%. Pt is stable and resting. RT will cont to monitor.

## 2021-07-21 DIAGNOSIS — S83105A Unspecified dislocation of left knee, initial encounter: Secondary | ICD-10-CM | POA: Diagnosis not present

## 2021-07-21 LAB — RENAL FUNCTION PANEL
Albumin: 2.4 g/dL — ABNORMAL LOW (ref 3.5–5.0)
Anion gap: 7 (ref 5–15)
BUN: 113 mg/dL — ABNORMAL HIGH (ref 8–23)
CO2: 25 mmol/L (ref 22–32)
Calcium: 8.9 mg/dL (ref 8.9–10.3)
Chloride: 102 mmol/L (ref 98–111)
Creatinine, Ser: 1.82 mg/dL — ABNORMAL HIGH (ref 0.61–1.24)
GFR, Estimated: 41 mL/min — ABNORMAL LOW (ref >60)
Glucose, Bld: 188 mg/dL — ABNORMAL HIGH (ref 70–99)
Phosphorus: 4.6 mg/dL (ref 2.5–4.6)
Potassium: 4.5 mmol/L (ref 3.5–5.1)
Sodium: 134 mmol/L — ABNORMAL LOW (ref 135–145)

## 2021-07-21 LAB — GLUCOSE, CAPILLARY
Glucose-Capillary: 155 mg/dL — ABNORMAL HIGH (ref 70–99)
Glucose-Capillary: 144 mg/dL — ABNORMAL HIGH (ref 70–99)
Glucose-Capillary: 105 mg/dL — ABNORMAL HIGH (ref 70–99)
Glucose-Capillary: 152 mg/dL — ABNORMAL HIGH (ref 70–99)

## 2021-07-21 LAB — CBC
HCT: 23.9 % — ABNORMAL LOW (ref 39.0–52.0)
Hemoglobin: 7 g/dL — ABNORMAL LOW (ref 13.0–17.0)
MCH: 27.1 pg (ref 26.0–34.0)
MCHC: 29.3 g/dL — ABNORMAL LOW (ref 30.0–36.0)
MCV: 92.6 fL (ref 80.0–100.0)
Platelets: 463 10*3/uL — ABNORMAL HIGH (ref 150–400)
RBC: 2.58 MIL/uL — ABNORMAL LOW (ref 4.22–5.81)
RDW: 16.4 % — ABNORMAL HIGH (ref 11.5–15.5)
WBC: 6.1 10*3/uL (ref 4.0–10.5)
nRBC: 0.7 % — ABNORMAL HIGH (ref 0.0–0.2)

## 2021-07-21 LAB — PROTIME-INR
INR: 1.7 — ABNORMAL HIGH (ref 0.8–1.2)
Prothrombin Time: 19.8 seconds — ABNORMAL HIGH (ref 11.4–15.2)

## 2021-07-21 LAB — FERRITIN: Ferritin: 151 ng/mL (ref 24–336)

## 2021-07-21 LAB — IRON AND TIBC
Iron: 25 ug/dL — ABNORMAL LOW (ref 45–182)
Saturation Ratios: 13 % — ABNORMAL LOW (ref 17.9–39.5)
TIBC: 195 ug/dL — ABNORMAL LOW (ref 250–450)
UIBC: 170 ug/dL

## 2021-07-21 MED ORDER — WARFARIN SODIUM 5 MG PO TABS
5.0000 mg | ORAL_TABLET | Freq: Once | ORAL | Status: AC
  Administered 2021-07-21: 5 mg via ORAL
  Filled 2021-07-21: qty 1

## 2021-07-21 MED ORDER — SODIUM CHLORIDE 0.9 % IV SOLN
250.0000 mg | Freq: Every day | INTRAVENOUS | Status: AC
Start: 2021-07-21 — End: 2021-07-24
  Administered 2021-07-21 – 2021-07-24 (×4): 250 mg via INTRAVENOUS
  Filled 2021-07-21 (×4): qty 20

## 2021-07-21 NOTE — TOC Progression Note (Addendum)
Transition of Care (TOC) - Progression Note    Patient Details  Name: Joseph [name]. MRN: [id number] Date of Birth: 1956/01/04  Transition of Care [facility]) CM/SW Contact  Curlene Labrum, RN Phone Number: 07/21/2021, 10:42 AM  Clinical Narrative:    CM met with the patient and wife at the bedside to discuss transitions of care to a SNF facility once medically stable for transfer.  The patient and wife were both agreeable to [facility] placement for physical rehabilitation after discharge from the hospital.  The patient's wife plans to [facility] today to facilitate acceptance of a facility once a bed is available at the facilities.  CM called and updated [facility] that the patient may be medically stable for transfer to a SNF after Friday, 07/24/2021.  [facility] is currently looking at securing bariatric equipment and will follow up regarding bed availability at the facility.  The patient's wife states that she has an available wheelchair ramp at the home but is planning on having renovations made to the existing  structure.  I gave her available resources in the [place], [place] area for follow up if needed.  CM and MSW with DTP Team will continue to follow the patient for [facility] needs to SNF facility.  07/21/2021 1132 - CM called and spoke with [facility] and [facility] in [place], [place] and both facilities are unable to offer bed availability due to staffing limitations.  I asked that the [facility] admission liaison reach out to the [facility] in [place] to inquire about bed availability.  CM updated that patient's wife about CM search for accepting SNF facility.  07/21/2021 1138 CM spoke with the patient's wife and she asked that I contact [facility] and possibly [facility] in [place].  CM left a message with both facilities to check on bed availability.  [facility] - facility is full and unable to offer a bed [facility] - No bariatric bed  availability [facility] in [facility] - checking on bed availability [facility] - left message and checking on bed availability [facility] - facility is full - no bariatric beds [facility] - checking on bariatric bed availability   Expected Discharge Plan: [place] Barriers to Discharge: Continued Medical Work up (Plan for return to OR with Dr. Sharol Given on 10/14- I&D)  Expected Discharge Plan and Services Expected Discharge Plan: [place] In-house Referral: Clinical Social Work Discharge Planning Services: CM Consult Post Acute Care Choice: [place] arrangements for the past 2 months: Single Family Home                                       Social Determinants of Health (SDOH) Interventions    Readmission Risk Interventions Readmission Risk Prevention Plan 07/15/2021 07/09/2021  Transportation Screening Complete Complete  Medication Review Press [employer]) Complete Complete  PCP or Specialist appointment within 3-5 days of discharge Complete Complete  HRI or Home Care Consult Complete Complete  SW Recovery Care/Counseling Consult Complete Complete  Palliative Care Screening Complete -  [place] Complete Complete  Some recent data might be hidden

## 2021-07-21 NOTE — Plan of Care (Signed)

## 2021-07-21 NOTE — Progress Notes (Signed)
PROGRESS NOTE    Joseph [name].  HYW:[id number] DOB: [birth year] DOA: 06/11/2021 PCP: Donald Prose, MD    Brief Narrative:  65 year old gentleman with history of super morbid obesity, history of sleep apnea on CPAP, type 2 diabetes, hypertension, hyperlipidemia, peripheral artery disease, DVT on Coumadin, GERD, anxiety and depression and multiple falls presented to the emergency room on 9/15 after a fall at home resulting in left knee pain.  He was found to have a left knee dislocation and fracture fragments with left knee effusion.  Underwent close reduction in the ER and admitted to the hospital. 9/15, admitted with close reduction but continues pain. 9/30, MRI with significant ligamentous injury and lateral patellar dislocation.  Left knee extensive debridement, reduction of dislocation, external fixation with wound VAC application for chronic left thigh wound. X-rays of both I&D on 10/12 and 10/14-weightbearing as tolerated/17. Continues to have difficulty with pain management and anxiety.  Currently on Ativan. On CPAP for sleep apnea Also received 2 units of PRBC and 1 unit of FFP for supratherapeutic INR during hospital course. 10/22, noted shortness of breath on talking.  Hospitalization prolonged with severe physical deconditioning and remains with very poor respiratory status.   Assessment & Plan:   Principal Problem:   Left knee dislocation, initial encounter Active Problems:   Pure hypercholesterolemia   Essential hypertension, benign   OSA (obstructive sleep apnea)   Hx pulmonary embolism   Esophageal reflux   Diabetes mellitus type 2, uncontrolled   Diabetic neuropathy (HCC)   Acute renal failure superimposed on stage 3b chronic kidney disease (HCC)   Normocytic anemia   Class 3 obesity (HCC)   Aortic atherosclerosis (HCC)   Coronary atherosclerosis   Effusion of left knee   Left knee dislocation   Pressure injury of skin   Unstageable pressure ulcer of sacral  region ([place])   At risk for hemorrhage associated with anticoagulation therapy   Ulcer of left knee, with necrosis of bone (HCC)   Open knee wound, left, subsequent encounter   Moderate episode of recurrent major depressive disorder ([place])  Fall, left knee hematoma and dislocation: Surgical reduction with external fixation, hematoma evacuation and skin grafting by Dr. Haskell Riling management as per surgery. Weightbearing as tolerated.  Adequate pain medications. Currently on oxycodone IR 10 mg daily, 5 mg every 4 hours as needed and IV Dilaudid.  Also on Robaxin.  Still using IV pain medications for intermittent pain control. Will optimize oral pain medication once his respiratory status improves.  Anasarca and pulmonary edema: Likely secondary to severe hypoalbuminemia and fluid overload.  Intermittently treated with IV Lasix with no significant results. Chest x-ray 10/22 with marked pulmonary edema treated with Lasix and albumin.  On p.o. chlorthalidone. Echocardiogram 10/7 with normal left ventricular ejection fraction and right ventricular function. Repeat renal functions today.  Followed by nephrology.  Acute kidney injury on CKD stage IIIb: Baseline creatinine 1.1-1.2.  Renal functions further worsening.  Not sure he is a dialysis candidate.  Acute on chronic hypoxemic and hypercarbic respiratory failure, obstructive sleep apnea: Using CPAP at night.  He has become BiPAP dependent now.  Unsure recovery.  Acute metabolic encephalopathy: Resolved.  Ativan discontinued and currently on Prozac 60 mg.  Delirium precautions.  Type 2 diabetes: Controlled.  On Lantus and Premeal insulin.  Also on Actos.  Essential hypertension: Blood pressure is stable.  History of DVT/PE: On Coumadin and presented with supratherapeutic INR.  Followed by pharmacy for adjustment every day.  INR 1.7.  Anxiety disorder: On Prozac every morning and hydroxyzine as needed.  Ativan discontinued.  Transient  bradycardia and frequent PVCs: Asymptomatic in the context of sleep apnea.  Severe morbid obesity: BMI 64.  Physical deconditioning anxiety and lethargy limiting his ability to participate in PT. Poor recovery due to severe morbid obesity, nonhealing wounds, pain and unable to come off the BiPAP.   DVT prophylaxis:   Therapeutic on Coumadin. warfarin (COUMADIN) tablet 5 mg   Code Status: Full code. Family Communication: Wife at the bedside. Disposition Plan: Status is: Inpatient  Remains inpatient appropriate because: Around-the-clock BiPAP.  Unsafe discharge because of dependent on BiPAP, pain management challenges and mobility challenges.       Consultants:  Orthopedics Pulmonary critical care Nephrology  Procedures:  Multiple procedures applicable  Antimicrobials:  Multiple antibiotics, currently on Keflex   Subjective: Seen and examined.  Wife at the bedside.  Patient complains of some sore throat today.  Still has significant pain but better controlled than before.  He is more awake today and he points to me that he is slightly better.  Objective: Vitals:   07/21/21 0359 07/21/21 0400 07/21/21 0733 07/21/21 1101  BP: (!) 148/38 (!) 148/38 (!) 144/42 (!) 134/39  Pulse: 77 78 77 69  Resp: 15 18 19 18   Temp: (!) 97.5 F (36.4 C)  98 F (36.7 C) 97.7 F (36.5 C)  TempSrc: Axillary  Axillary Oral  SpO2: 95% 97% 95% 90%  Weight:      Height:        Intake/Output Summary (Last 24 hours) at 07/21/2021 1245 Last data filed at 07/21/2021 1102 Gross per 24 hour  Intake --  Output 1050 ml  Net -1050 ml   Filed Weights   06/25/21 0900 07/01/21 1518 07/08/21 0958  Weight: (!) 218 kg (!) 218 kg (!) 218 kg    Examination:  General exam: Morbidly obese gentleman laying in bed on continuous CPAP and oxygen.  Looks comfortable.  Not in any distress.  He has some pressure wounds on the nasal bridge. He has some redness and erythema on oropharynx. Respiratory  system: Poor bilateral air entry but no added sounds. Can hear some conducted upper airway sounds of the BiPAP. Cardiovascular system: S1 & S2 heard, RRR.  Gastrointestinal system: Soft.  Obese and pendulous.  Bowel sounds present.  Nontender.  Central nervous system: Alert and oriented. No focal neurological deficits.  Generalized weakness. Extremities:  Right lower extremity with chronic venous stasis changes and pigmentation, 2+ edema mostly chronic. Left lower extremity with big open wound anterior thigh with serosanguineous discharge, external fixator in place.  2+ edema.  Tender all along.    Data Reviewed: I have personally reviewed following labs and imaging studies  CBC: Recent Labs  Lab 07/18/21 0337 07/18/21 2128 07/19/21 0148 07/20/21 0420 07/21/21 0259  WBC 8.1 8.3 7.9 7.2 6.1  HGB 7.6* 7.7* 7.8* 7.7* 7.0*  HCT 25.1* 25.4* 26.9* 26.5* 23.9*  MCV 93.0 94.1 95.4 94.0 92.6  PLT 405* 428* 441* 450* 829*   Basic Metabolic Panel: Recent Labs  Lab 07/15/21 0406 07/16/21 0401 07/17/21 0343 07/18/21 0337 07/18/21 2128 07/20/21 0946 07/21/21 0259  NA 129* 136 136 135 134* 135 134*  K 5.0 5.2* 5.5* 5.2* 5.4* 4.8 4.5  CL 100 105 106 105 104 102 102  CO2 22 22 24  21* 20* 21* 25  GLUCOSE 199* 149* 172* 193* 212* 230* 188*  BUN 71* 77* 80* 91* 99* 104* 113*  CREATININE  1.36* 1.25* 1.35* 1.46* 1.58* 1.77* 1.82*  CALCIUM 8.7* 8.9 9.1 9.0 8.8* 8.8* 8.9  MG  --   --   --   --  2.2  --   --   PHOS 3.6 3.7 4.2 4.4  --   --  4.6   GFR: Estimated Creatinine Clearance: 78.4 mL/min (A) (by C-G formula based on SCr of 1.82 mg/dL (H)). Liver Function Tests: Recent Labs  Lab 07/16/21 0401 07/17/21 0343 07/18/21 0337 07/18/21 2128 07/21/21 0259  AST  --   --   --  16  --   ALT  --   --   --  7  --   ALKPHOS  --   --   --  86  --   BILITOT  --   --   --  1.0  --   PROT  --   --   --  5.9*  --   ALBUMIN 2.0* 2.3* 2.4* 2.3* 2.4*   No results for input(s): LIPASE, AMYLASE  in the last 168 hours. No results for input(s): AMMONIA in the last 168 hours. Coagulation Profile: Recent Labs  Lab 07/17/21 0343 07/18/21 0337 07/19/21 0148 07/20/21 0420 07/21/21 0259  INR 2.6* 3.8* 4.6* 5.6* 1.7*   Cardiac Enzymes: Recent Labs  Lab 07/17/21 1635  CKTOTAL 16*   BNP (last 3 results) No results for input(s): PROBNP in the last 8760 hours. HbA1C: No results for input(s): HGBA1C in the last 72 hours. CBG: Recent Labs  Lab 07/20/21 1143 07/20/21 1529 07/20/21 2107 07/21/21 0732 07/21/21 1100  GLUCAP 190* 170* 186* 155* 144*   Lipid Profile: No results for input(s): CHOL, HDL, LDLCALC, TRIG, CHOLHDL, LDLDIRECT in the last 72 hours. Thyroid Function Tests: No results for input(s): TSH, T4TOTAL, FREET4, T3FREE, THYROIDAB in the last 72 hours. Anemia Panel: Recent Labs    07/21/21 0259  FERRITIN 151  TIBC 195*  IRON 25*   Sepsis Labs: No results for input(s): PROCALCITON, LATICACIDVEN in the last 168 hours.  No results found for this or any previous visit (from the past 240 hour(s)).       Radiology Studies: US RENAL  Result Date: 07/20/2021 CLINICAL DATA:  Acute kidney injury EXAM: RENAL / URINARY TRACT ULTRASOUND COMPLETE COMPARISON:  CT 06/11/2021 FINDINGS: Right Kidney: Renal measurements: 13 x 6.5 x 6.9 cm = volume: 302.6 mL. Echogenicity within normal limits. No mass or hydronephrosis visualized. Left Kidney: Unable to visualize due to bowel gas. Bladder: Unable to visualize due to habitus Other: None. IMPRESSION: 1. Normal ultrasound appearance of right kidney 2. Left kidney and bladder were unable to be visualized secondary to combination of habitus and gas. Electronically Signed   By: Donavan Foil M.D.   On: 07/20/2021 21:03   DG CHEST PORT 1 VIEW  Result Date: 07/20/2021 CLINICAL DATA:  65 year old male with shortness of breath. EXAM: PORTABLE CHEST 1 VIEW COMPARISON:  Portable chest 07/18/2021 and earlier. FINDINGS: Portable AP semi  upright view at 0900 hours. Rotated to the left similar to before. Mildly larger lung volumes. Mediastinal contours are stable and within normal limits. Abnormal course bilateral pulmonary interstitial opacity. Less confluence of bilateral perihilar opacity. No pneumothorax, pleural effusion or air bronchograms. Paucity of bowel gas in the visible upper abdomen. No acute osseous abnormality identified. IMPRESSION: Larger lung volumes with ongoing course bilateral pulmonary interstitial opacity. Bilateral perihilar opacity is less confluent. Top differential considerations are acute viral/atypical respiratory infection versus pulmonary edema. No pleural effusion is evident.  Electronically Signed   By: Genevie Ann M.D.   On: 07/20/2021 09:09        Scheduled Meds:  (feeding supplement) PROSource Plus  30 mL Oral BID BM   vitamin C  1,000 mg Oral Daily   cephALEXin  250 mg Oral QHS   darbepoetin (ARANESP) injection - NON-DIALYSIS  200 mcg Subcutaneous Q Mon-1800   docusate sodium  100 mg Oral Daily   feeding supplement (GLUCERNA SHAKE)  237 mL Oral Q24H   FLUoxetine  60 mg Oral QPM   furosemide  80 mg Intravenous Q8H   insulin aspart  0-20 Units Subcutaneous TID WC   insulin aspart  0-5 Units Subcutaneous QHS   insulin aspart  3 Units Subcutaneous TID WC   insulin glargine-yfgn  55 Units Subcutaneous QHS   methocarbamol  1,000 mg Oral BID   multivitamin with minerals  1 tablet Oral Daily   nutrition supplement (JUVEN)  1 packet Oral BID BM   oxyCODONE  10 mg Oral Daily   pantoprazole  40 mg Oral Daily   pioglitazone  45 mg Oral Daily   polyethylene glycol  17 g Oral BID   Ensure Max Protein  11 oz Oral Daily   senna  1 tablet Oral Daily   warfarin  5 mg Oral ONCE-1600   Warfarin - Pharmacist Dosing Inpatient   Does not apply q1600   zinc sulfate  220 mg Oral Daily   Continuous Infusions:  sodium chloride 10 mL/hr at 07/13/21 1107   albumin human 25 g (07/21/21 0541)   ferric gluconate  (FERRLECIT) IVPB 250 mg (07/21/21 1244)     LOS: 39 days    Time spent: 34 minutes    Barb Merino, MD [facility] Pager [phone]

## 2021-07-21 NOTE — Progress Notes (Signed)
Occupational Therapy Treatment Patient Details Name: Joseph [name]. MRN: [id number] DOB: 1955-12-09 Today's Date: 07/21/2021   History of present illness Joseph [name] is a 65 yo male who presnets with increased LLE swelling after recent fall at home. 06/11/21 pt fell when getting off x-ray table at hospital. X-rays showed a knee dislocation which was confirmed on CT scan as a posterior lateral dislocation; L knee reduced and L KI placed. Lumbar and pelvic xrays negative. 10/5: left knee dislocation reduction with external fixation placement and extensive debridement of hematoma and devitalized skin subtenons tissue muscle fascia Placement of wound VAC; Wound debridement on 10/12. 10/14 L knee and thigh debridement with skin graft, now WBAT L LE.  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   OT comments  Called nsg to premedicate with pain and anxiety meds prior to scheduled session at 1100. Pt requesting to push session back. Attempted to see pt at 1130. Pt declined mobility, asking OT to return later in day. Focus of session on positioning. Pt with MASD under pannus - sheet placed to wick moisture away. Blanket roll placed laterally of LLE to decrease amount of ER at hip. Noted pressure area (unstageable - skin intact, soft, dark, nonblanchable) L lateral aspect of heel. Heel floated and Prevalon boot ordered. Pt falling asleep throughout session and states he did not sleep last night. Wife/nsg states they are unaware of pt being unable to sleep. Will follow up later time.    Recommendations for follow up therapy are one component of a multi-disciplinary discharge planning process, led by the attending physician.  Recommendations may be updated based on patient status, additional functional criteria and insurance authorization.    Follow Up Recommendations  Skilled nursing-short term rehab (<3 hours/day)    Assistance Recommended at Discharge    Equipment  Recommendations  BSC (bari)    Recommendations for Other Services      Precautions / Restrictions Precautions Precautions: Fall Precaution Comments: LLE external fixation; anxiety; large pannus - foam above external fixator; MASD under pannus; wound vac; L heel pressure wound Restrictions LLE Weight Bearing: Weight bearing as tolerated Other Position/Activity Restrictions: clarified order with Dr. Sharol Given, WBAT in exfix to L LE       Mobility Bed Mobility                    Transfers                   General transfer comment: pt declined     Balance                                           ADL either performed or assessed with clinical judgement   ADL                                         General ADL Comments: focus of session on positioning     Vision       Perception     Praxis      Cognition Arousal/Alertness: Lethargic Behavior During Therapy: Flat affect;Anxious (tearful)  Exercises     Shoulder Instructions       General Comments encouraged wife to comlete previous exercises wehn pt more alert    Pertinent Vitals/ Pain       Pain Assessment: 0-10 Pain Score: 5  Pain Location: L knee Pain Descriptors / Indicators: Discomfort Pain Intervention(s): Premedicated before session;Repositioned  Home Living                                          Prior Functioning/Environment              Frequency  Min 2X/week        Progress Toward Goals  OT Goals(current goals can now be found in the care plan section)  Progress towards OT goals: Not progressing toward goals - comment  Acute Rehab OT Goals OT Goal Formulation: With patient/family Time For Goal Achievement: 07/30/21 Potential to Achieve Goals: Fair ADL Goals Pt Will Perform Upper Body Bathing: bed level Pt/caregiver will Perform Home Exercise  Program: Increased strength;Both right and left upper extremity;With theraband Additional ADL Goal #1: Ptwill roll R/L with 1 person min A in preparation for ADL tasks Additional ADL Goal #2: Pt will tolerate standing in Tilt bed x 15 min at 45 degrees to improve stadning tolerance for ADL andmoblity Additional ADL Goal #3: Pt will verbalize 2 strategies to reduce risk of MASD under pannus  Plan Discharge plan remains appropriate    Co-evaluation                 AM-PAC OT "6 Clicks" Daily Activity     Outcome Measure   Help from another person eating meals?: None Help from another person taking care of personal grooming?: A Little Help from another person toileting, which includes using toliet, bedpan, or urinal?: Total Help from another person bathing (including washing, rinsing, drying)?: A Lot Help from another person to put on and taking off regular upper body clothing?: A Lot Help from another person to put on and taking off regular lower body clothing?: Total 6 Click Score: 13    End of Session Equipment Utilized During Treatment: Oxygen (CPAP)  OT Visit Diagnosis: Unsteadiness on feet (R26.81);Other abnormalities of gait and mobility (R26.89);Muscle weakness (generalized) (M62.81);History of falling (Z91.81);Pain Pain - Right/Left: Left Pain - part of body: Leg   Activity Tolerance Patient limited by fatigue;Patient limited by lethargy   Patient Left in bed;with call bell/phone within reach;with family/visitor present   Nurse Communication Other (comment) (pressure area L heel; declined participation)        Time: 5465-6812 OT Time Calculation (min): 21 min  Charges: OT General Charges $OT Visit: 1 Visit OT Treatments $Therapeutic Activity: 8-22 mins  Maurie Boettcher, OT/L   Acute OT [facility] Special[facility] [place] Pager [phone] Office [phone]   [facility] 07/21/2021, 12:21 PM

## 2021-07-21 NOTE — Progress Notes (Signed)
Subjective:  Have been attempting diuresis with albumin and lasix-  UOP not well recorded but seems more ??   Have had to rig the purewick to collect the urine- Hemodynamically stable-  BUN and crt worsening a little -   u/s showed normal appearing right kidney -  left not visualized --- U/A completely neg for blood or protein.  He reports fatigue-  has been cpap/bipap dep   Objective Vital signs in last 24 hours: Vitals:   07/20/21 2317 07/21/21 0359 07/21/21 0400 07/21/21 0733  BP: (!) 141/42 (!) 148/38 (!) 148/38 (!) 144/42  Pulse: 80 77 78 77  Resp: 20 15 18 19   Temp: 97.6 F (36.4 C) (!) 97.5 F (36.4 C)  98 F (36.7 C)  TempSrc: Oral Axillary  Axillary  SpO2: 92% 95% 97% 95%  Weight:      Height:       Weight change:   Intake/Output Summary (Last 24 hours) at 07/21/2021 1034 Last data filed at 07/21/2021 0402 Gross per 24 hour  Intake --  Output 750 ml  Net -750 ml    Assessment/Plan: 65 year old WM with many medical issues admitted after a fall req surgical management of LLE-  hosp complicated by extremely limited mobility-  has developed some A on CRF with BUN out of proprortion to crt-  refractory so it seems edema  1.Renal- some A on CRF esp with high BUN.  This is a very complicated situation.  I cannot determine an etiology for his changed renal function.  u/s and another U/A not giving any clues-  seeming like pre renal insult, ? Cardiorenal?   all nephrotoxic meds have been discontinued.   trying and maximize renal perfusion so limiting what we can in terms of pain meds and muscle relaxers-  have stopped his prn hydralazine-  u/s does not show obstruction - I will stop his flomax.  Performing dialysis in this gentleman would be very problematic due to his size and his limited mobility and it is doubtful that he would get to a point where he would be appropriate for OP HD logistically -  I may consider some HD in the short term if needed however-  I have explained the  barriers to pt and his wife today 2. Hypertension/volume  - very volume overloaded-  likely third spacing but also is overloaded-  will try to use an albumin/lasix combo to attempt some diuresis mostly so that he can be liberated from the CPAP.  Amazing that his echo earlier in the month did not reveal any right sided cardiac issues -  so far so good 3. Anemia  - not helping his volume status-  dose with ESA and also IV iron   Louis Meckel    Labs: Basic Metabolic Panel: Recent Labs  Lab 07/17/21 0343 07/18/21 0337 07/18/21 2128 07/20/21 0946 07/21/21 0259  NA 136 135 134* 135 134*  K 5.5* 5.2* 5.4* 4.8 4.5  CL 106 105 104 102 102  CO2 24 21* 20* 21* 25  GLUCOSE 172* 193* 212* 230* 188*  BUN 80* 91* 99* 104* 113*  CREATININE 1.35* 1.46* 1.58* 1.77* 1.82*  CALCIUM 9.1 9.0 8.8* 8.8* 8.9  PHOS 4.2 4.4  --   --  4.6   Liver Function Tests: Recent Labs  Lab 07/18/21 0337 07/18/21 2128 07/21/21 0259  AST  --  16  --   ALT  --  7  --   ALKPHOS  --  86  --   BILITOT  --  1.0  --   PROT  --  5.9*  --   ALBUMIN 2.4* 2.3* 2.4*   No results for input(s): LIPASE, AMYLASE in the last 168 hours. No results for input(s): AMMONIA in the last 168 hours. CBC: Recent Labs  Lab 07/18/21 0337 07/18/21 2128 07/19/21 0148 07/20/21 0420 07/21/21 0259  WBC 8.1 8.3 7.9 7.2 6.1  HGB 7.6* 7.7* 7.8* 7.7* 7.0*  HCT 25.1* 25.4* 26.9* 26.5* 23.9*  MCV 93.0 94.1 95.4 94.0 92.6  PLT 405* 428* 441* 450* 463*   Cardiac Enzymes: Recent Labs  Lab 07/17/21 1635  CKTOTAL 16*   CBG: Recent Labs  Lab 07/20/21 0754 07/20/21 1143 07/20/21 1529 07/20/21 2107 07/21/21 0732  GLUCAP 210* 190* 170* 186* 155*    Iron Studies:  Recent Labs    07/21/21 0259  IRON 25*  TIBC 195*  FERRITIN 151   Studies/Results: US RENAL  Result Date: 07/20/2021 CLINICAL DATA:  Acute kidney injury EXAM: RENAL / URINARY TRACT ULTRASOUND COMPLETE COMPARISON:  CT 06/11/2021 FINDINGS: Right Kidney:  Renal measurements: 13 x 6.5 x 6.9 cm = volume: 302.6 mL. Echogenicity within normal limits. No mass or hydronephrosis visualized. Left Kidney: Unable to visualize due to bowel gas. Bladder: Unable to visualize due to habitus Other: None. IMPRESSION: 1. Normal ultrasound appearance of right kidney 2. Left kidney and bladder were unable to be visualized secondary to combination of habitus and gas. Electronically Signed   By: Donavan Foil M.D.   On: 07/20/2021 21:03   DG CHEST PORT 1 VIEW  Result Date: 07/20/2021 CLINICAL DATA:  65 year old male with shortness of breath. EXAM: PORTABLE CHEST 1 VIEW COMPARISON:  Portable chest 07/18/2021 and earlier. FINDINGS: Portable AP semi upright view at 0900 hours. Rotated to the left similar to before. Mildly larger lung volumes. Mediastinal contours are stable and within normal limits. Abnormal course bilateral pulmonary interstitial opacity. Less confluence of bilateral perihilar opacity. No pneumothorax, pleural effusion or air bronchograms. Paucity of bowel gas in the visible upper abdomen. No acute osseous abnormality identified. IMPRESSION: Larger lung volumes with ongoing course bilateral pulmonary interstitial opacity. Bilateral perihilar opacity is less confluent. Top differential considerations are acute viral/atypical respiratory infection versus pulmonary edema. No pleural effusion is evident. Electronically Signed   By: Genevie Ann M.D.   On: 07/20/2021 09:09   Medications: Infusions:  sodium chloride 10 mL/hr at 07/13/21 1107   albumin human 25 g (07/21/21 0541)    Scheduled Medications:  (feeding supplement) PROSource Plus  30 mL Oral BID BM   vitamin C  1,000 mg Oral Daily   cephALEXin  250 mg Oral QHS   darbepoetin (ARANESP) injection - NON-DIALYSIS  200 mcg Subcutaneous Q Mon-1800   docusate sodium  100 mg Oral Daily   feeding supplement (GLUCERNA SHAKE)  237 mL Oral Q24H   FLUoxetine  60 mg Oral QPM   furosemide  80 mg Intravenous Q8H    insulin aspart  0-20 Units Subcutaneous TID WC   insulin aspart  0-5 Units Subcutaneous QHS   insulin aspart  3 Units Subcutaneous TID WC   insulin glargine-yfgn  55 Units Subcutaneous QHS   methocarbamol  1,000 mg Oral BID   multivitamin with minerals  1 tablet Oral Daily   nutrition supplement (JUVEN)  1 packet Oral BID BM   oxyCODONE  10 mg Oral Daily   pantoprazole  40 mg Oral Daily   pioglitazone  45 mg Oral  Daily   polyethylene glycol  17 g Oral BID   Ensure Max Protein  11 oz Oral Daily   senna  1 tablet Oral Daily   tamsulosin  0.4 mg Oral Daily   warfarin  5 mg Oral ONCE-1600   Warfarin - Pharmacist Dosing Inpatient   Does not apply q1600   zinc sulfate  220 mg Oral Daily    have reviewed scheduled and prn medications.  Physical Exam: General:  morbidly obese-  cpap in place Heart: RRR Lungs: CBS bilat  Abdomen: obese-  abd wall edema  Extremities: pitting edema-  external fixator on left     07/21/2021,10:34 AM  LOS: 39 days

## 2021-07-21 NOTE — Progress Notes (Signed)
Orthopedic Tech Progress Note Patient Details:  Joseph [name] October 14, 1955 [id number]  Called floor to let RN know materials carries the [facility] not ortho.   Patient ID: Joseph Coutant., male   DOB: 11-Sep-1956, 65 y.o.   MRN: [id number]  Joseph [name] 07/21/2021, 12:46 PM

## 2021-07-21 NOTE — Progress Notes (Signed)
ANTICOAGULATION CONSULT NOTE - Follow Up Consult  Pharmacy Consult for Warfarin Indication: h/o pulmonary embolus  Allergies  Allergen Reactions   Tape     Peels skin on legs   Chlorhexidine Rash    Wipes    Patient Measurements: Height: 6\' 1"  (185.4 cm) Weight: (!) 218 kg (480 lb 9.6 oz) IBW/kg (Calculated) : 79.9  Vital Signs: Temp: 98 F (36.7 C) (10/25 0733) Temp Source: Axillary (10/25 0733) BP: 144/42 (10/25 0733) Pulse Rate: 77 (10/25 0733)  Labs: Recent Labs    07/18/21 2128 07/19/21 0148 07/20/21 0420 07/20/21 0946 07/21/21 0259  HGB 7.7* 7.8* 7.7*  --  7.0*  HCT 25.4* 26.9* 26.5*  --  23.9*  PLT 428* 441* 450*  --  463*  LABPROT  --  43.2* 50.9*  --  19.8*  INR  --  4.6* 5.6*  --  1.7*  CREATININE 1.58*  --   --  1.77* 1.82*     Estimated Creatinine Clearance: 78.4 mL/min (A) (by C-G formula based on SCr of 1.82 mg/dL (H)).   Medications:  PTA Warfarin: 5 mg on Sun/Mon/Wed/Fri evening, and 7.5 mg on Tue/Thur/Sat evening.   INR was supratherapeutic (6.2) at admission  Assessment: 56 YOM on lifelong warfarin therapy for hx of PE, body habitus, and sedentary lifestyle admitted on 9/15 with left knee dislocation and effusion concerning for hematoma. At admission, warfarin was held, vitamin K was given for supratherapeutic INR (6.2), and pt was transfused.   Patient resumed on warfarin, but doses held 10/22-10/24 due to supratherapeutic INR (up to 5.6 on 10/24). S/p vitamin K 5mg  IV x 1 on 10/23 and 10/24. INR down to 1.7 today. Hg down to 7, plt high. No bleed issues reported. Noted SCr up to 1.82.  Goal of Therapy:  INR 2-3 Monitor platelets by anticoagulation protocol: Yes   Plan:  Give warfarin 5mg  PO x 1 at 1600 Monitor daily INR closely, CBC, s/sx bleeding   Arturo Morton, PharmD, BCPS Please check AMION for all [place] contact numbers Clinical Pharmacist 07/21/2021 9:53 AM

## 2021-07-21 NOTE — Progress Notes (Signed)
Pt placed on dreamstation for bed. Rt will cont to monitor. Pt sat 94%.

## 2021-07-22 DIAGNOSIS — S83105A Unspecified dislocation of left knee, initial encounter: Secondary | ICD-10-CM | POA: Diagnosis not present

## 2021-07-22 LAB — CBC
HCT: 24.8 % — ABNORMAL LOW (ref 39.0–52.0)
Hemoglobin: 7.2 g/dL — ABNORMAL LOW (ref 13.0–17.0)
MCH: 26.9 pg (ref 26.0–34.0)
MCHC: 29 g/dL — ABNORMAL LOW (ref 30.0–36.0)
MCV: 92.5 fL (ref 80.0–100.0)
Platelets: 470 10*3/uL — ABNORMAL HIGH (ref 150–400)
RBC: 2.68 MIL/uL — ABNORMAL LOW (ref 4.22–5.81)
RDW: 16.7 % — ABNORMAL HIGH (ref 11.5–15.5)
WBC: 6.1 10*3/uL (ref 4.0–10.5)
nRBC: 0.7 % — ABNORMAL HIGH (ref 0.0–0.2)

## 2021-07-22 LAB — PROTIME-INR
INR: 1.5 — ABNORMAL HIGH (ref 0.8–1.2)
Prothrombin Time: 17.8 seconds — ABNORMAL HIGH (ref 11.4–15.2)

## 2021-07-22 LAB — RENAL FUNCTION PANEL
Albumin: 2.8 g/dL — ABNORMAL LOW (ref 3.5–5.0)
Anion gap: 9 (ref 5–15)
BUN: 128 mg/dL — ABNORMAL HIGH (ref 8–23)
CO2: 24 mmol/L (ref 22–32)
Calcium: 9 mg/dL (ref 8.9–10.3)
Chloride: 102 mmol/L (ref 98–111)
Creatinine, Ser: 1.88 mg/dL — ABNORMAL HIGH (ref 0.61–1.24)
GFR, Estimated: 39 mL/min — ABNORMAL LOW (ref >60)
Glucose, Bld: 123 mg/dL — ABNORMAL HIGH (ref 70–99)
Phosphorus: 4.3 mg/dL (ref 2.5–4.6)
Potassium: 4.4 mmol/L (ref 3.5–5.1)
Sodium: 135 mmol/L (ref 135–145)

## 2021-07-22 LAB — GLUCOSE, CAPILLARY
Glucose-Capillary: 115 mg/dL — ABNORMAL HIGH (ref 70–99)
Glucose-Capillary: 106 mg/dL — ABNORMAL HIGH (ref 70–99)
Glucose-Capillary: 97 mg/dL (ref 70–99)
Glucose-Capillary: 133 mg/dL — ABNORMAL HIGH (ref 70–99)

## 2021-07-22 MED ORDER — FUROSEMIDE 10 MG/ML IJ SOLN
80.0000 mg | Freq: Two times a day (BID) | INTRAMUSCULAR | Status: DC
  Administered 2021-07-22 – 2021-07-31 (×18): 80 mg via INTRAVENOUS
  Filled 2021-07-22 (×18): qty 8

## 2021-07-22 MED ORDER — WARFARIN SODIUM 5 MG PO TABS
5.0000 mg | ORAL_TABLET | Freq: Once | ORAL | Status: AC
  Administered 2021-07-22: 5 mg via ORAL
  Filled 2021-07-22: qty 1

## 2021-07-22 NOTE — Progress Notes (Signed)
PROGRESS NOTE    Joseph [name].  CVE:[id number] DOB: [birth year] DOA: 06/11/2021 PCP: Donald Prose, MD    Brief Narrative:  65 year old gentleman with history of super morbid obesity, history of sleep apnea on CPAP, type 2 diabetes, hypertension, hyperlipidemia, peripheral artery disease, DVT on Coumadin, GERD, anxiety and depression and multiple falls presented to the emergency room on 9/15 after a fall at home resulting in left knee pain.  He was found to have a left knee dislocation and fracture fragments with left knee effusion.  Underwent close reduction in the ER and admitted to the hospital. 9/15, admitted with close reduction but continues pain. 9/30, MRI with significant ligamentous injury and lateral patellar dislocation.  Left knee extensive debridement, reduction of dislocation, external fixation with wound VAC application for chronic left thigh wound. X-rays of both I&D on 10/12 and 10/14-weightbearing as tolerated/17. Continues to have difficulty with pain management and anxiety.  Currently on Ativan. On CPAP for sleep apnea Also received 2 units of PRBC and 1 unit of FFP for supratherapeutic INR during hospital course. 10/22, noted shortness of breath on talking.  Hospitalization prolonged with severe physical deconditioning and remains with very poor respiratory status.   Assessment & Plan:   Principal Problem:   Left knee dislocation, initial encounter Active Problems:   Pure hypercholesterolemia   Essential hypertension, benign   OSA (obstructive sleep apnea)   Hx pulmonary embolism   Esophageal reflux   Diabetes mellitus type 2, uncontrolled   Diabetic neuropathy (HCC)   Acute renal failure superimposed on stage 3b chronic kidney disease (HCC)   Normocytic anemia   Class 3 obesity (HCC)   Aortic atherosclerosis (HCC)   Coronary atherosclerosis   Effusion of left knee   Left knee dislocation   Pressure injury of skin   Unstageable pressure ulcer of sacral  region ([place])   At risk for hemorrhage associated with anticoagulation therapy   Ulcer of left knee, with necrosis of bone (HCC)   Open knee wound, left, subsequent encounter   Moderate episode of recurrent major depressive disorder ([place])  Fall, left knee hematoma and dislocation: Surgical reduction with external fixation, hematoma evacuation and skin grafting by Dr. Haskell Riling management as per surgery. Weightbearing as tolerated.  Adequate pain medications. Currently on oxycodone IR 10 mg daily, 5 mg every 4 hours as needed and IV Dilaudid.  Also on Robaxin.  Still using IV pain medications for intermittent pain control.  Anasarca and pulmonary edema: Likely secondary to severe hypoalbuminemia and fluid overload.  Intermittently treated with IV Lasix with no significant results. Chest x-ray 10/22 with marked pulmonary edema treated with Lasix and albumin.  Echocardiogram 10/7 with normal left ventricular ejection fraction and right ventricular function. Remains significantly fluid overloaded.  Followed by nephrology.  Acute kidney injury on CKD stage IIIb: Baseline creatinine 1.1-1.2.  Renal functions further worsening.    Acute on chronic hypoxemic and hypercarbic respiratory failure, obstructive sleep apnea: Using CPAP at night.  Use CPAP most of the time with breaks for comfort.  Acute metabolic encephalopathy: Resolved.  Ativan discontinued and currently on Prozac 60 mg.  Delirium precautions.  Type 2 diabetes: Controlled.  On Lantus and Premeal insulin.  Also on Actos.  Essential hypertension: Blood pressure is stable.  History of DVT/PE: On Coumadin and presented with supratherapeutic INR.  Followed by pharmacy for adjustment every day.    Anxiety disorder: On Prozac every morning and hydroxyzine as needed.  Ativan discontinued.  Transient bradycardia and frequent  PVCs: Asymptomatic in the context of sleep apnea.  Severe morbid obesity: BMI 64.  Physical deconditioning  anxiety and lethargy limiting his ability to participate in PT. Poor recovery due to severe morbid obesity, nonhealing wounds, pain and unable to come off the CPAP around-the-clock.   DVT prophylaxis:   Therapeutic on Coumadin.   Code Status: Full code. Family Communication: Wife at the bedside. Disposition Plan: Status is: Inpatient  Remains inpatient appropriate because: Around-the-clock CPAP. unsafe discharge because of active pain management, IV pain medications.  Renal function recovery.       Consultants:  Orthopedics Pulmonary critical care Nephrology  Procedures:  Multiple procedures applicable  Antimicrobials:  Multiple antibiotics, currently on Keflex   Subjective: Patient seen and examined.  Wife at the bedside.  Throat pain is better today.  Using CPAP. Wife was worried about his bedside monitor swelling fluctuating oxygen without patient having any obvious symptoms. Worried about dressing changes on the left thigh.  Objective: Vitals:   07/21/21 2305 07/22/21 0443 07/22/21 0806 07/22/21 1143  BP: (!) 150/41 (!) 149/47 (!) 150/43 (!) 145/47  Pulse: 77 77 78 72  Resp: 17 20 20 18   Temp: 98.2 F (36.8 C) 98.8 F (37.1 C) 98.5 F (36.9 C) 97.7 F (36.5 C)  TempSrc: Oral Axillary Oral Oral  SpO2: 97% 92% 93% 95%  Weight:      Height:        Intake/Output Summary (Last 24 hours) at 07/22/2021 1251 Last data filed at 07/22/2021 1144 Gross per 24 hour  Intake 553.04 ml  Output 1100 ml  Net -546.96 ml   Filed Weights   06/25/21 0900 07/01/21 1518 07/08/21 0958  Weight: (!) 218 kg (!) 218 kg (!) 218 kg    Examination:  General exam: Morbidly obese gentleman laying in bed on continuous CPAP and oxygen.  Looks comfortable.  Not in any distress.  He has some pressure wounds on the nasal bridge. Cannot talk in full sentences. Respiratory system: Poor bilateral air entry but no added sounds. Cardiovascular system: S1 & S2 heard, RRR.   Gastrointestinal system: Soft.  Obese and pendulous.  Bowel sounds present.  Nontender.   Central nervous system: Alert and oriented. No focal neurological deficits.  Generalized weakness. Extremities:  Right lower extremity with chronic venous stasis changes and pigmentation, 2+ edema mostly chronic. Left lower extremity with big open wound anterior thigh with serosanguineous discharge, external fixator in place.   Dorsum of the feet edema improving.    Data Reviewed: I have personally reviewed following labs and imaging studies  CBC: Recent Labs  Lab 07/18/21 2128 07/19/21 0148 07/20/21 0420 07/21/21 0259 07/22/21 0419  WBC 8.3 7.9 7.2 6.1 6.1  HGB 7.7* 7.8* 7.7* 7.0* 7.2*  HCT 25.4* 26.9* 26.5* 23.9* 24.8*  MCV 94.1 95.4 94.0 92.6 92.5  PLT 428* 441* 450* 463* 944*   Basic Metabolic Panel: Recent Labs  Lab 07/16/21 0401 07/17/21 0343 07/18/21 0337 07/18/21 2128 07/20/21 0946 07/21/21 0259 07/22/21 0419  NA 136 136 135 134* 135 134* 135  K 5.2* 5.5* 5.2* 5.4* 4.8 4.5 4.4  CL 105 106 105 104 102 102 102  CO2 22 24 21* 20* 21* 25 24  GLUCOSE 149* 172* 193* 212* 230* 188* 123*  BUN 77* 80* 91* 99* 104* 113* 128*  CREATININE 1.25* 1.35* 1.46* 1.58* 1.77* 1.82* 1.88*  CALCIUM 8.9 9.1 9.0 8.8* 8.8* 8.9 9.0  MG  --   --   --  2.2  --   --   --  PHOS 3.7 4.2 4.4  --   --  4.6 4.3   GFR: Estimated Creatinine Clearance: 75.9 mL/min (A) (by C-G formula based on SCr of 1.88 mg/dL (H)). Liver Function Tests: Recent Labs  Lab 07/17/21 0343 07/18/21 0337 07/18/21 2128 07/21/21 0259 07/22/21 0419  AST  --   --  16  --   --   ALT  --   --  7  --   --   ALKPHOS  --   --  86  --   --   BILITOT  --   --  1.0  --   --   PROT  --   --  5.9*  --   --   ALBUMIN 2.3* 2.4* 2.3* 2.4* 2.8*   No results for input(s): LIPASE, AMYLASE in the last 168 hours. No results for input(s): AMMONIA in the last 168 hours. Coagulation Profile: Recent Labs  Lab 07/18/21 0337  07/19/21 0148 07/20/21 0420 07/21/21 0259 07/22/21 0419  INR 3.8* 4.6* 5.6* 1.7* 1.5*   Cardiac Enzymes: Recent Labs  Lab 07/17/21 1635  CKTOTAL 16*   BNP (last 3 results) No results for input(s): PROBNP in the last 8760 hours. HbA1C: No results for input(s): HGBA1C in the last 72 hours. CBG: Recent Labs  Lab 07/21/21 1100 07/21/21 1533 07/21/21 2131 07/22/21 0804 07/22/21 1142  GLUCAP 144* 105* 152* 115* 106*   Lipid Profile: No results for input(s): CHOL, HDL, LDLCALC, TRIG, CHOLHDL, LDLDIRECT in the last 72 hours. Thyroid Function Tests: No results for input(s): TSH, T4TOTAL, FREET4, T3FREE, THYROIDAB in the last 72 hours. Anemia Panel: Recent Labs    07/21/21 0259  FERRITIN 151  TIBC 195*  IRON 25*   Sepsis Labs: No results for input(s): PROCALCITON, LATICACIDVEN in the last 168 hours.  No results found for this or any previous visit (from the past 240 hour(s)).       Radiology Studies: US RENAL  Result Date: 07/20/2021 CLINICAL DATA:  Acute kidney injury EXAM: RENAL / URINARY TRACT ULTRASOUND COMPLETE COMPARISON:  CT 06/11/2021 FINDINGS: Right Kidney: Renal measurements: 13 x 6.5 x 6.9 cm = volume: 302.6 mL. Echogenicity within normal limits. No mass or hydronephrosis visualized. Left Kidney: Unable to visualize due to bowel gas. Bladder: Unable to visualize due to habitus Other: None. IMPRESSION: 1. Normal ultrasound appearance of right kidney 2. Left kidney and bladder were unable to be visualized secondary to combination of habitus and gas. Electronically Signed   By: Donavan Foil M.D.   On: 07/20/2021 21:03        Scheduled Meds:  (feeding supplement) PROSource Plus  30 mL Oral BID BM   vitamin C  1,000 mg Oral Daily   cephALEXin  250 mg Oral QHS   darbepoetin (ARANESP) injection - NON-DIALYSIS  200 mcg Subcutaneous Q Mon-1800   docusate sodium  100 mg Oral Daily   feeding supplement (GLUCERNA SHAKE)  237 mL Oral Q24H   FLUoxetine  60 mg Oral  QPM   furosemide  80 mg Intravenous Q12H   insulin aspart  0-20 Units Subcutaneous TID WC   insulin aspart  0-5 Units Subcutaneous QHS   insulin aspart  3 Units Subcutaneous TID WC   insulin glargine-yfgn  55 Units Subcutaneous QHS   methocarbamol  1,000 mg Oral BID   multivitamin with minerals  1 tablet Oral Daily   nutrition supplement (JUVEN)  1 packet Oral BID BM   oxyCODONE  10 mg Oral Daily   pantoprazole  40 mg Oral Daily   pioglitazone  45 mg Oral Daily   polyethylene glycol  17 g Oral BID   Ensure Max Protein  11 oz Oral Daily   senna  1 tablet Oral Daily   Warfarin - Pharmacist Dosing Inpatient   Does not apply q1600   zinc sulfate  220 mg Oral Daily   Continuous Infusions:  sodium chloride 10 mL/hr at 07/13/21 1107   ferric gluconate (FERRLECIT) IVPB 250 mg (07/22/21 1029)     LOS: 40 days    Time spent: 30 minutes    Barb Merino, MD [facility] Pager [phone]

## 2021-07-22 NOTE — Progress Notes (Signed)
ANTICOAGULATION CONSULT NOTE - Follow Up Consult  Pharmacy Consult for Warfarin Indication: h/o pulmonary embolus  Allergies  Allergen Reactions   Tape     Peels skin on legs   Chlorhexidine Rash    Wipes    Patient Measurements: Height: 6\' 1"  (185.4 cm) Weight: (!) 218 kg (480 lb 9.6 oz) IBW/kg (Calculated) : 79.9  Vital Signs: Temp: 97.7 F (36.5 C) (10/26 1143) Temp Source: Oral (10/26 1143) BP: 145/47 (10/26 1143) Pulse Rate: 72 (10/26 1143)  Labs: Recent Labs    07/20/21 0420 07/20/21 0946 07/21/21 0259 07/22/21 0419  HGB 7.7*  --  7.0* 7.2*  HCT 26.5*  --  23.9* 24.8*  PLT 450*  --  463* 470*  LABPROT 50.9*  --  19.8* 17.8*  INR 5.6*  --  1.7* 1.5*  CREATININE  --  1.77* 1.82* 1.88*     Estimated Creatinine Clearance: 75.9 mL/min (A) (by C-G formula based on SCr of 1.88 mg/dL (H)).   Medications:  PTA Warfarin: 5 mg on Sun/Mon/Wed/Fri evening, and 7.5 mg on Tue/Thur/Sat evening.   INR was supratherapeutic (6.2) at admission  Assessment: 22 YOM on lifelong warfarin therapy for hx of PE, body habitus, and sedentary lifestyle admitted on 9/15 with left knee dislocation and effusion concerning for hematoma. At admission, warfarin was held, vitamin K was given for supratherapeutic INR (6.2), and pt was transfused.   Patient resumed on warfarin, but doses held 10/22-10/24 due to supratherapeutic INR (up to 5.6 on 10/24). S/p vitamin K 5mg  IV x 1 on 10/23 and 10/24. Warfarin resumed 10/25 PM.  INR today remains SUBtherapeutic (INR 1.5 << 1.7, goal of 2-3). Hgb 7.2 - low but stable.   Goal of Therapy:  INR 2-3 Monitor platelets by anticoagulation protocol: Yes   Plan:  - Repeat Warfarin 5 mg x 1 dose today - Daily PT/INR, CBC q72h - Will continue to monitor for any signs/symptoms of bleeding and will follow up with PT/INR in the a.m.    Thank you for allowing pharmacy to be a part of this patient's care.  Alycia Rossetti, PharmD, BCPS Clinical  Pharmacist Clinical phone for 07/22/2021: [phone] 07/22/2021 1:48 PM   **Pharmacist phone directory can now be found on [email or web address] ([email or web address]).  Listed under [place].

## 2021-07-22 NOTE — TOC Progression Note (Addendum)
Transition of Care (TOC) - Progression Note    Patient Details  Name: Joseph [name]. MRN: [id number] Date of Birth: [birth year]  Transition of Care [facility]) CM/SW Contact  Curlene Labrum, RN Phone Number: 07/22/2021, 8:16 AM  Clinical Narrative:    CM and MSW with DTP Team continue to explore options for SNF placement.  At this time, the patient does not have bed offers for admission that will support patient's complex care needs.  [facility] declined a bed offer [facility] - multiple facilities - left message with admission liaison, Ebony Hail  Additional facilities placed in the hub to explore for admission opportunities.  07/22/2021 1015 - CM spoke with Ebony Hail, [place] at [facility] and the facility is unable to meet the care needs at this time but will continue to follow the patient for potential bed offer.  The facility may be able to offer a bed to the patient once he is able to mobilize more readily.  At this time, [facility] facilities are the only potential bed option for the patient due to care needs.  Even then, the patient will have to physically progress to a point to be able to assist with his mobility before bed offer will be considered.  CM and MSW with DTP will continue to follow the patient for placement options.   Expected Discharge Plan: Skilled Nursing Facility Barriers to Discharge: Continued Medical Work up (Plan for return to OR with Dr. Sharol Given on 10/14- I&D)  Expected Discharge Plan and Services Expected Discharge Plan: [place] In-house Referral: Clinical Social Work Discharge Planning Services: CM Consult Post Acute Care Choice: [place] arrangements for the past 2 months: Single Family Home                                       Social Determinants of Health (SDOH) Interventions    Readmission Risk Interventions Readmission Risk Prevention Plan 07/15/2021 07/09/2021  Transportation Screening  Complete Complete  Medication Review Press photographer) Complete Complete  PCP or Specialist appointment within 3-5 days of discharge Complete Complete  HRI or Home Care Consult Complete Complete  SW Recovery Care/Counseling Consult Complete Complete  Palliative Care Screening Complete -  [place] Complete Complete  Some recent data might be hidden

## 2021-07-22 NOTE — Progress Notes (Signed)
Subjective:  Have been attempting diuresis with albumin and lasix-  UOP 1200-  overall negative 650    Hemodynamically stable-  BUN and crt worsening a little -   He reports fatigue-  has been cpap/bipap dep still   Objective Vital signs in last 24 hours: Vitals:   07/21/21 2305 07/22/21 0443 07/22/21 0806 07/22/21 1143  BP: (!) 150/41 (!) 149/47 (!) 150/43 (!) 145/47  Pulse: 77 77 78 72  Resp: 17 20 20 18   Temp: 98.2 F (36.8 C) 98.8 F (37.1 C) 98.5 F (36.9 C) 97.7 F (36.5 C)  TempSrc: Oral Axillary Oral Oral  SpO2: 97% 92% 93% 95%  Weight:      Height:       Weight change:   Intake/Output Summary (Last 24 hours) at 07/22/2021 1225 Last data filed at 07/22/2021 1144 Gross per 24 hour  Intake 553.04 ml  Output 1100 ml  Net -546.96 ml    Assessment/Plan: 65 year old WM with many medical issues admitted after a fall req surgical management of LLE-  hosp complicated by extremely limited mobility-  has developed some A on CRF with BUN out of proprortion to crt-  refractory so it seems edema  1.Renal- some A on CRF esp with high BUN.  This is a very complicated situation.  I cannot determine an etiology for his changed renal function.  u/s and another U/A not giving any clues-  seeming like pre renal insult, ? Cardiorenal?   all nephrotoxic meds have been discontinued.   trying and maximize renal perfusion so limiting what we can in terms of pain meds and muscle relaxers-  have stopped his prn hydralazine-  u/s does not show obstruction - I will stop his flomax.  Performing dialysis in this gentleman would be very problematic due to his size and his limited mobility and it is doubtful that he would get to a point where he would be appropriate for OP HD logistically -  I may consider some HD in the short term if needed however-  I have explained the barriers to pt and his wife today 2. Hypertension/volume  - very volume overloaded-  likely third spacing but also is overloaded-  have been  using an albumin/lasix combo to attempt some diuresis mostly so that he can be liberated from the CPAP.  Amazing that his echo earlier in the month did not reveal any right sided cardiac issues -  so far so good-  achieving a negative balance-  will stop albumin and just continue lasix 3. Anemia  - not helping his volume status-  dose with ESA and also IV iron   Louis Meckel    Labs: Basic Metabolic Panel: Recent Labs  Lab 07/18/21 0337 07/18/21 2128 07/20/21 0946 07/21/21 0259 07/22/21 0419  NA 135   < > 135 134* 135  K 5.2*   < > 4.8 4.5 4.4  CL 105   < > 102 102 102  CO2 21*   < > 21* 25 24  GLUCOSE 193*   < > 230* 188* 123*  BUN 91*   < > 104* 113* 128*  CREATININE 1.46*   < > 1.77* 1.82* 1.88*  CALCIUM 9.0   < > 8.8* 8.9 9.0  PHOS 4.4  --   --  4.6 4.3   < > = values in this interval not displayed.   Liver Function Tests: Recent Labs  Lab 07/18/21 2128 07/21/21 0259 07/22/21 0419  AST 16  --   --  ALT 7  --   --   ALKPHOS 86  --   --   BILITOT 1.0  --   --   PROT 5.9*  --   --   ALBUMIN 2.3* 2.4* 2.8*   No results for input(s): LIPASE, AMYLASE in the last 168 hours. No results for input(s): AMMONIA in the last 168 hours. CBC: Recent Labs  Lab 07/18/21 2128 07/19/21 0148 07/20/21 0420 07/21/21 0259 07/22/21 0419  WBC 8.3 7.9 7.2 6.1 6.1  HGB 7.7* 7.8* 7.7* 7.0* 7.2*  HCT 25.4* 26.9* 26.5* 23.9* 24.8*  MCV 94.1 95.4 94.0 92.6 92.5  PLT 428* 441* 450* 463* 470*   Cardiac Enzymes: Recent Labs  Lab 07/17/21 1635  CKTOTAL 16*   CBG: Recent Labs  Lab 07/21/21 1100 07/21/21 1533 07/21/21 2131 07/22/21 0804 07/22/21 1142  GLUCAP 144* 105* 152* 115* 106*    Iron Studies:  Recent Labs    07/21/21 0259  IRON 25*  TIBC 195*  FERRITIN 151   Studies/Results: US RENAL  Result Date: 07/20/2021 CLINICAL DATA:  Acute kidney injury EXAM: RENAL / URINARY TRACT ULTRASOUND COMPLETE COMPARISON:  CT 06/11/2021 FINDINGS: Right Kidney: Renal  measurements: 13 x 6.5 x 6.9 cm = volume: 302.6 mL. Echogenicity within normal limits. No mass or hydronephrosis visualized. Left Kidney: Unable to visualize due to bowel gas. Bladder: Unable to visualize due to habitus Other: None. IMPRESSION: 1. Normal ultrasound appearance of right kidney 2. Left kidney and bladder were unable to be visualized secondary to combination of habitus and gas. Electronically Signed   By: Donavan Foil M.D.   On: 07/20/2021 21:03   Medications: Infusions:  sodium chloride 10 mL/hr at 07/13/21 1107   ferric gluconate (FERRLECIT) IVPB 250 mg (07/22/21 1029)    Scheduled Medications:  (feeding supplement) PROSource Plus  30 mL Oral BID BM   vitamin C  1,000 mg Oral Daily   cephALEXin  250 mg Oral QHS   darbepoetin (ARANESP) injection - NON-DIALYSIS  200 mcg Subcutaneous Q Mon-1800   docusate sodium  100 mg Oral Daily   feeding supplement (GLUCERNA SHAKE)  237 mL Oral Q24H   FLUoxetine  60 mg Oral QPM   insulin aspart  0-20 Units Subcutaneous TID WC   insulin aspart  0-5 Units Subcutaneous QHS   insulin aspart  3 Units Subcutaneous TID WC   insulin glargine-yfgn  55 Units Subcutaneous QHS   methocarbamol  1,000 mg Oral BID   multivitamin with minerals  1 tablet Oral Daily   nutrition supplement (JUVEN)  1 packet Oral BID BM   oxyCODONE  10 mg Oral Daily   pantoprazole  40 mg Oral Daily   pioglitazone  45 mg Oral Daily   polyethylene glycol  17 g Oral BID   Ensure Max Protein  11 oz Oral Daily   senna  1 tablet Oral Daily   Warfarin - Pharmacist Dosing Inpatient   Does not apply q1600   zinc sulfate  220 mg Oral Daily    have reviewed scheduled and prn medications.  Physical Exam: General:  morbidly obese-  cpap in place Heart: RRR Lungs: CBS bilat  Abdomen: obese-  abd wall edema  Extremities: pitting edema-  external fixator on left     07/22/2021,12:25 PM  LOS: 40 days

## 2021-07-22 NOTE — Progress Notes (Signed)
Physical Therapy Treatment Patient Details Name: Joseph [name]. MRN: [id number] DOB: [birth year] Today's Date: 07/22/2021   History of Present Illness Joseph [name] is a 65 yo male who presnets with increased LLE swelling after recent fall at home. 06/11/21 pt fell when getting off x-ray table at hospital. X-rays showed a knee dislocation which was confirmed on CT scan as a posterior lateral dislocation; L knee reduced and L KI placed. Lumbar and pelvic xrays negative. 10/5: left knee dislocation reduction with external fixation placement and extensive debridement of hematoma and devitalized skin subtenons tissue muscle fascia Placement of wound VAC; Wound debridement on 10/12. 10/14 L knee and thigh debridement with skin graft, now WBAT L LE.  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    PT Comments    Pt limited by fatigue during session, requiring frequent breaks due to reports of SOB. PT assists with repositioning of LLE, utilizing KREG bed strap to aide in preventing RLE abduction into gap between cushions in bed. PT encourages continued participation in exercise and provides assistance with exercise to aid in rolling to R side. PT also continues to emphasize the importance of upright positioning and use of incentive spirometer in an effort to wean from CPAP. PT will continue to follow to improve mobility quality and pulmonary capacity.  Recommendations for follow up therapy are one component of a multi-disciplinary discharge planning process, led by the attending physician.  Recommendations may be updated based on patient status, additional functional criteria and insurance authorization.  Follow Up Recommendations  Skilled nursing-short term rehab (<3 hours/day)     Assistance Recommended at Discharge [facility] bed;Other (comment) (bariatric bed, bariatric wheelchair, bariatric hoyer  lift)    Recommendations for Other Services       Precautions / Restrictions Precautions Precautions: Fall Precaution Comments: LLE external fixation; anxiety; large pannus - foam above external fixator; MASD under pannus; wound vac; L heel pressure wound Restrictions Weight Bearing Restrictions: Yes LLE Weight Bearing: Weight bearing as tolerated     Mobility  Bed Mobility Overal bed mobility: Needs Assistance Bed Mobility: Rolling           General bed mobility comments: pt works on reaching across body to right and left sides, grabbing rail on left side and PT hand hold to right side. Pt pulls to initiate rolling of trunk and facilitate trunk rotation    Transfers                        Ambulation/Gait                 Stairs             Wheelchair Mobility    Modified Rankin (Stroke Patients Only)       Balance                                            Cognition Arousal/Alertness: Awake/alert Behavior During Therapy: Flat affect Overall Cognitive Status: Within Functional Limits for tasks assessed                                 General Comments:  (benefits from encouragement, family present to assist in encouragement. PT assists in  positioning of LLE, utilizing KREG bed strap to wrap around L leg prevent LLE from abducting into gap between cushions. No pressure areas observed, RN aware)        Exercises General Exercises - Upper Extremity Shoulder Flexion: AROM;Both;5 reps;Theraband Theraband Level (Shoulder Flexion):  (orange level 2) Shoulder ABduction: AROM;Right;5 reps;Theraband Theraband Level (Shoulder Abduction):  (orange level 2) Shoulder Horizontal ABduction: AROM;5 reps;Theraband Theraband Level (Shoulder Horizontal Abduction):  (orange level 2) Shoulder Horizontal ADduction: AROM;5 reps;Right;Theraband Theraband Level (Shoulder Horizontal Adduction):  (orange level 2) Elbow Flexion:  AROM;Right;5 reps;Theraband Theraband Level (Elbow Flexion):  (orange level 2) Elbow Extension: Right;AROM;Theraband Theraband Level (Elbow Extension):  (orange level 2)    General Comments General comments (skin integrity, edema, etc.): pt in CPAP, PT provides continued education on the need for improved positioning and increased time in upright position to improve ease of breathing. PT provides reinforcement of need for use of incentive spirometer to improve pulmonary capacity and work inspiratory muscles.      Pertinent Vitals/Pain Pain Assessment: Faces Faces Pain Scale: Hurts even more Pain Location: LLE Pain Descriptors / Indicators: Grimacing Pain Intervention(s): Monitored during session    Home Living                          Prior Function            PT Goals (current goals can now be found in the care plan section) Acute Rehab PT Goals Patient Stated Goal: to help move as much as possible Progress towards PT goals: Not progressing toward goals - comment (limited by fatigue)    Frequency    Min 3X/week      PT Plan Current plan remains appropriate    Co-evaluation              AM-PAC PT "6 Clicks" Mobility   Outcome Measure  Help needed turning from your back to your side while in a flat bed without using bedrails?: Total Help needed moving from lying on your back to sitting on the side of a flat bed without using bedrails?: Total Help needed moving to and from a bed to a chair (including a wheelchair)?: Total Help needed standing up from a chair using your arms (e.g., wheelchair or bedside chair)?: Total Help needed to walk in hospital room?: Total Help needed climbing 3-5 steps with a railing? : Total 6 Click Score: 6    End of Session Equipment Utilized During Treatment: Oxygen Activity Tolerance: Patient limited by fatigue Patient left: in bed;with call bell/phone within reach;with family/visitor present Nurse Communication:  Mobility status;Need for lift equipment PT Visit Diagnosis: Other abnormalities of gait and mobility (R26.89);Muscle weakness (generalized) (M62.81);History of falling (Z91.81);Repeated falls (R29.6);Pain Pain - Right/Left: Left Pain - part of body: Knee;Leg     Time: 4888-9169 PT Time Calculation (min) (ACUTE ONLY): 23 min  Charges:  $Therapeutic Exercise: 23-37 mins                     Zenaida Niece, PT, DPT Acute Rehabilitation Pager: [phone] Office [place] 07/22/2021, 6:03 PM

## 2021-07-23 ENCOUNTER — Encounter (HOSPITAL_COMMUNITY): Payer: Self-pay | Admitting: Internal Medicine

## 2021-07-23 DIAGNOSIS — S83105A Unspecified dislocation of left knee, initial encounter: Secondary | ICD-10-CM | POA: Diagnosis not present

## 2021-07-23 LAB — GLUCOSE, CAPILLARY
Glucose-Capillary: 157 mg/dL — ABNORMAL HIGH (ref 70–99)
Glucose-Capillary: 149 mg/dL — ABNORMAL HIGH (ref 70–99)
Glucose-Capillary: 130 mg/dL — ABNORMAL HIGH (ref 70–99)
Glucose-Capillary: 146 mg/dL — ABNORMAL HIGH (ref 70–99)

## 2021-07-23 LAB — RENAL FUNCTION PANEL
Albumin: 2.6 g/dL — ABNORMAL LOW (ref 3.5–5.0)
Anion gap: 8 (ref 5–15)
BUN: 139 mg/dL — ABNORMAL HIGH (ref 8–23)
CO2: 24 mmol/L (ref 22–32)
Calcium: 8.9 mg/dL (ref 8.9–10.3)
Chloride: 103 mmol/L (ref 98–111)
Creatinine, Ser: 2.29 mg/dL — ABNORMAL HIGH (ref 0.61–1.24)
GFR, Estimated: 31 mL/min — ABNORMAL LOW (ref >60)
Glucose, Bld: 151 mg/dL — ABNORMAL HIGH (ref 70–99)
Phosphorus: 5.2 mg/dL — ABNORMAL HIGH (ref 2.5–4.6)
Potassium: 4.6 mmol/L (ref 3.5–5.1)
Sodium: 135 mmol/L (ref 135–145)

## 2021-07-23 LAB — CBC
HCT: 25.2 % — ABNORMAL LOW (ref 39.0–52.0)
Hemoglobin: 7.2 g/dL — ABNORMAL LOW (ref 13.0–17.0)
MCH: 26.9 pg (ref 26.0–34.0)
MCHC: 28.6 g/dL — ABNORMAL LOW (ref 30.0–36.0)
MCV: 94 fL (ref 80.0–100.0)
Platelets: 471 10*3/uL — ABNORMAL HIGH (ref 150–400)
RBC: 2.68 MIL/uL — ABNORMAL LOW (ref 4.22–5.81)
RDW: 17 % — ABNORMAL HIGH (ref 11.5–15.5)
WBC: 7 10*3/uL (ref 4.0–10.5)
nRBC: 0.3 % — ABNORMAL HIGH (ref 0.0–0.2)

## 2021-07-23 LAB — PROTIME-INR
INR: 1.6 — ABNORMAL HIGH (ref 0.8–1.2)
Prothrombin Time: 19.3 seconds — ABNORMAL HIGH (ref 11.4–15.2)

## 2021-07-23 MED ORDER — HEPARIN SODIUM (PORCINE) 1000 UNIT/ML DIALYSIS: 1000.0000 [IU] | INTRAMUSCULAR | Status: DC | PRN

## 2021-07-23 MED ORDER — SODIUM CHLORIDE 0.9 % IV SOLN: 100.0000 mL | INTRAVENOUS | Status: DC | PRN

## 2021-07-23 MED ORDER — INFLUENZA VAC SPLIT QUAD 0.5 ML IM SUSY
0.5000 mL | PREFILLED_SYRINGE | INTRAMUSCULAR | Status: DC
  Filled 2021-07-23: qty 0.5

## 2021-07-23 MED ORDER — LIDOCAINE-PRILOCAINE 2.5-2.5 % EX CREA: 1.0000 "application " | TOPICAL_CREAM | CUTANEOUS | Status: DC | PRN

## 2021-07-23 MED ORDER — PENTAFLUOROPROP-TETRAFLUOROETH EX AERO: 1.0000 "application " | INHALATION_SPRAY | CUTANEOUS | Status: DC | PRN

## 2021-07-23 MED ORDER — ALTEPLASE 2 MG IJ SOLR: 2.0000 mg | Freq: Once | INTRAMUSCULAR | Status: DC | PRN

## 2021-07-23 MED ORDER — LIDOCAINE HCL (PF) 1 % IJ SOLN: 5.0000 mL | INTRAMUSCULAR | Status: DC | PRN

## 2021-07-23 NOTE — Progress Notes (Signed)
Subjective:  Have been attempting diuresis with albumin and lasix, lasix only last 24 hours-  UOP only 750-  overall negative 490    Hemodynamically stable-  BUN and crt worsening still -   He reports fatigue-  has been cpap/bipap dep still   Objective Vital signs in last 24 hours: Vitals:   07/23/21 0747 07/23/21 0758 07/23/21 0818 07/23/21 1137  BP:   (!) 141/42 (!) 152/56  Pulse: 84 83 80 71  Resp: (!) 28 18 19 13   Temp:   97.6 F (36.4 C) 97.6 F (36.4 C)  TempSrc:   Oral Oral  SpO2: (!) 87% 92% 98% 98%  Weight:      Height:       Weight change:   Intake/Output Summary (Last 24 hours) at 07/23/2021 1236 Last data filed at 07/23/2021 0408 Gross per 24 hour  Intake 260.33 ml  Output 550 ml  Net -289.67 ml    Assessment/Plan: 65 year old WM with many medical issues admitted after a fall req surgical management of LLE-  hosp complicated by extremely limited mobility-  has developed some A on CRF with BUN out of proprortion to crt-  refractory so it seems edema  1.Renal- some A on CRF esp with high BUN.    I cannot determine an etiology for his changed renal function.  u/s and another U/A not giving any clues-  seeming like pre renal insult, ? Cardiorenal?   all nephrotoxic meds have been discontinued.   trying and maximize renal perfusion so limiting what we can in terms of pain meds and muscle relaxers-  have stopped his prn hydralazine-  u/s does not show obstruction - have stopped his flomax.  Performing dialysis in this gentleman would be very problematic due to his size and his limited mobility and it is doubtful that he would get to a point where he would be appropriate for OP HD logistically -  I may consider some HD in the short term if needed however-  I have explained the barriers to pt and his wife-  they wish to proceed with HD at least in the short term-  at baseline PTA pt could ambulate in the house-  would go to the MD once a month-  riding in car.  I am still pessimistic  about his suitability for OP HD.  Have consulted IR for HD cath-  INR is 1.6-  will hold coumadin and see 2. Hypertension/volume  - very volume overloaded-  likely third spacing but also is overloaded-  have been using an albumin/lasix combo to attempt some diuresis mostly so that he can be liberated from the CPAP.  Amazing that his echo earlier in the month did not reveal any right sided cardiac issues -    achieving a negative balance but not enough-  need to start HD 3. Anemia  - not helping his volume status-  dose with ESA and also IV iron   Louis Meckel    Labs: Basic Metabolic Panel: Recent Labs  Lab 07/21/21 0259 07/22/21 0419 07/23/21 0444  NA 134* 135 135  K 4.5 4.4 4.6  CL 102 102 103  CO2 25 24 24   GLUCOSE 188* 123* 151*  BUN 113* 128* 139*  CREATININE 1.82* 1.88* 2.29*  CALCIUM 8.9 9.0 8.9  PHOS 4.6 4.3 5.2*   Liver Function Tests: Recent Labs  Lab 07/18/21 2128 07/21/21 0259 07/22/21 0419 07/23/21 0444  AST 16  --   --   --  ALT 7  --   --   --   ALKPHOS 86  --   --   --   BILITOT 1.0  --   --   --   PROT 5.9*  --   --   --   ALBUMIN 2.3* 2.4* 2.8* 2.6*   No results for input(s): LIPASE, AMYLASE in the last 168 hours. No results for input(s): AMMONIA in the last 168 hours. CBC: Recent Labs  Lab 07/19/21 0148 07/20/21 0420 07/21/21 0259 07/22/21 0419 07/23/21 0444  WBC 7.9 7.2 6.1 6.1 7.0  HGB 7.8* 7.7* 7.0* 7.2* 7.2*  HCT 26.9* 26.5* 23.9* 24.8* 25.2*  MCV 95.4 94.0 92.6 92.5 94.0  PLT 441* 450* 463* 470* 471*   Cardiac Enzymes: Recent Labs  Lab 07/17/21 1635  CKTOTAL 16*   CBG: Recent Labs  Lab 07/22/21 1142 07/22/21 1524 07/22/21 2142 07/23/21 0814 07/23/21 1135  GLUCAP 106* 97 133* 157* 149*    Iron Studies:  Recent Labs    07/21/21 0259  IRON 25*  TIBC 195*  FERRITIN 151   Studies/Results: No results found. Medications: Infusions:  sodium chloride 10 mL/hr at 07/13/21 1107   ferric gluconate (FERRLECIT)  IVPB 250 mg (07/23/21 0935)    Scheduled Medications:  (feeding supplement) PROSource Plus  30 mL Oral BID BM   vitamin C  1,000 mg Oral Daily   cephALEXin  250 mg Oral QHS   darbepoetin (ARANESP) injection - NON-DIALYSIS  200 mcg Subcutaneous Q Mon-1800   docusate sodium  100 mg Oral Daily   feeding supplement (GLUCERNA SHAKE)  237 mL Oral Q24H   FLUoxetine  60 mg Oral QPM   furosemide  80 mg Intravenous Q12H   insulin aspart  0-20 Units Subcutaneous TID WC   insulin aspart  0-5 Units Subcutaneous QHS   insulin aspart  3 Units Subcutaneous TID WC   insulin glargine-yfgn  55 Units Subcutaneous QHS   methocarbamol  1,000 mg Oral BID   multivitamin with minerals  1 tablet Oral Daily   nutrition supplement (JUVEN)  1 packet Oral BID BM   oxyCODONE  10 mg Oral Daily   pantoprazole  40 mg Oral Daily   pioglitazone  45 mg Oral Daily   polyethylene glycol  17 g Oral BID   Ensure Max Protein  11 oz Oral Daily   senna  1 tablet Oral Daily   Warfarin - Pharmacist Dosing Inpatient   Does not apply q1600    have reviewed scheduled and prn medications.  Physical Exam: General:  morbidly obese-  cpap in place Heart: RRR Lungs: CBS bilat  Abdomen: obese-  abd wall edema  Extremities: pitting edema-  external fixator on left     07/23/2021,12:36 PM  LOS: 41 days

## 2021-07-23 NOTE — Progress Notes (Signed)
Patient on home CPAP machine resting well.  VSS RT will continue to monitor.

## 2021-07-23 NOTE — Progress Notes (Signed)
Unable to bring pt down for tunneled HD catheter due to IR schedule. Spoke with pt's nurse, we will bring pt down tomorrow. Pt can eat tonight but will be NPO after midnight tonight. Hold blood thinners.

## 2021-07-23 NOTE — TOC Progression Note (Signed)
Transition of Care (TOC) - Progression Note    Patient Details  Name: Joseph [name]. MRN: [id number] Date of Birth: [birth year]  Transition of Care [facility]) CM/SW Contact  Curlene Labrum, RN Phone Number: 07/23/2021, 2:52 PM  Clinical Narrative:    CM and MSW with DTP continue to follow the patient for transitions of care needs at this time.   Expected Discharge Plan: Skilled Nursing Facility Barriers to Discharge: Continued Medical Work up (Plan for return to OR with Dr. Sharol Given on 10/14- I&D)  Expected Discharge Plan and Services Expected Discharge Plan: [place] In-house Referral: Clinical Social Work Discharge Planning Services: CM Consult Post Acute Care Choice: [place] arrangements for the past 2 months: Single Family Home                                       Social Determinants of Health (SDOH) Interventions    Readmission Risk Interventions Readmission Risk Prevention Plan 07/15/2021 07/09/2021  Transportation Screening Complete Complete  Medication Review [employer]) Complete Complete  PCP or Specialist appointment within 3-5 days of discharge Complete Complete  HRI or Home Care Consult Complete Complete  SW Recovery Care/Counseling Consult Complete Complete  Palliative Care Screening Complete -  [place] Complete Complete  Some recent data might be hidden

## 2021-07-23 NOTE — Telephone Encounter (Signed)
Pt is still an inpatient at [facility].   Will reach out to the pt after he is D/C.

## 2021-07-23 NOTE — Progress Notes (Signed)
PROGRESS NOTE    [name].  JGG:[id number] DOB: [birth year] DOA: 06/11/2021 PCP: Donald Prose, MD    Brief Narrative:  65 year old gentleman with history of super morbid obesity, history of sleep apnea on CPAP, type 2 diabetes, hypertension, hyperlipidemia, peripheral artery disease, DVT on Coumadin, GERD, anxiety and depression and multiple falls presented to the emergency room on 9/15 after a fall at home resulting in left knee pain.  He was found to have a left knee dislocation and fracture fragments with left knee effusion.  Underwent close reduction in the ER and admitted to the hospital. 9/15, admitted with close reduction but continues pain. 9/30, MRI with significant ligamentous injury and lateral patellar dislocation.  Left knee extensive debridement, reduction of dislocation, external fixation with wound VAC application for chronic left thigh wound. On CPAP for sleep apnea Also received 2 units of PRBC and 1 unit of FFP for supratherapeutic INR during hospital course. 10/22, noted shortness of breath on talking.  Hospitalization prolonged with severe physical deconditioning and remains with very poor respiratory status.  Nephrology consulted.   Assessment & Plan:   Principal Problem:   Left knee dislocation, initial encounter Active Problems:   Pure hypercholesterolemia   Essential hypertension, benign   OSA (obstructive sleep apnea)   Hx pulmonary embolism   Esophageal reflux   Diabetes mellitus type 2, uncontrolled   Diabetic neuropathy (HCC)   Acute renal failure superimposed on stage 3b chronic kidney disease (HCC)   Normocytic anemia   Class 3 obesity (HCC)   Aortic atherosclerosis (HCC)   Coronary atherosclerosis   Effusion of left knee   Left knee dislocation   Pressure injury of skin   Unstageable pressure ulcer of sacral region ([place])   At risk for hemorrhage associated with anticoagulation therapy   Ulcer of left knee, with necrosis of bone (HCC)   Open  knee wound, left, subsequent encounter   Moderate episode of recurrent major depressive disorder ([place])  Fall, left knee hematoma and dislocation: Surgical reduction with external fixation, hematoma evacuation and skin grafting by Dr. Haskell Riling management as per surgery. Weightbearing as tolerated.  Adequate pain medications. Currently on oxycodone IR 10 mg daily, 5 mg every 4 hours as needed and IV Dilaudid.  Also on Robaxin.  Still using IV pain medications for intermittent pain control. Difficulty healing his left thigh wound due to immobility and multiple medical factors.  Anasarca and pulmonary edema: Likely secondary to severe hypoalbuminemia and fluid overload.  Intermittently treated with IV Lasix with no significant results. 10/27, with significant fluid retention and edema of the legs and the scrotum.  Echocardiogram with normal ejection fraction. Ideally, may benefit with ultrafiltration with hemodialysis.  He may not be appropriate candidate for dialysis as outpatient.  Nephrology following. Patient's wife agreeable to consult palliative care to discuss about his pain management, goal of care.  Acute kidney injury on CKD stage IIIb: Baseline creatinine 1.1-1.2.  Kidney functions continue to worsen.  Acute on chronic hypoxemic and hypercarbic respiratory failure, obstructive sleep apnea: Using CPAP at night.  Use CPAP most of the time with breaks for comfort. Intermittently gets shortness of breath and unable to maintain on CPAP, uses BiPAP.  Acute metabolic encephalopathy: Resolved.  Ativan discontinued and currently on Prozac 60 mg.  Delirium precautions.  Type 2 diabetes: Controlled.  On Lantus and Premeal insulin.  Also on Actos.  Essential hypertension: Blood pressure is stable.  History of DVT/PE: On Coumadin.  Pharmacy managing.  Anxiety disorder:  On Prozac every morning and hydroxyzine as needed.  Ativan discontinued.  Transient bradycardia and frequent PVCs:  Asymptomatic in the context of sleep apnea.  Severe morbid obesity: BMI 64.  Anemia of chronic disease: Currently getting iron transfusions.  Hemoglobin 7-7.2.  Blood transfusion will put him on fluid overload.  If he goes to hemodialysis, will benefit with PRBC transfusion.  Physical deconditioning anxiety and lethargy limiting his ability to participate in PT. Poor recovery due to severe morbid obesity, nonhealing wounds, pain and unable to come off the CPAP around-the-clock. Palliative care consultation today.   DVT prophylaxis:   Therapeutic on Coumadin.   Code Status: Full code. Family Communication: Wife at the bedside. Disposition Plan: Status is: Inpatient  Remains inpatient appropriate because: Around-the-clock CPAP. unsafe discharge because of active pain management, IV pain medications.  Renal function recovery.       Consultants:  Orthopedics Pulmonary critical care Nephrology  Procedures:  Multiple procedures applicable  Antimicrobials:  Multiple antibiotics, currently on Keflex   Subjective: Patient seen and examined.  I was called to see him today morning with worsening shortness of breath.  Patient feeling very tired and fatigued.  Also having significant pain. He looks obviously more sicker today.  He has more edema on the dorsum of the foot today.  Objective: Vitals:   07/23/21 0747 07/23/21 0758 07/23/21 0818 07/23/21 1137  BP:   (!) 141/42 (!) 152/56  Pulse: 84 83 80 71  Resp: (!) 28 18 19 13   Temp:   97.6 F (36.4 C) 97.6 F (36.4 C)  TempSrc:   Oral Oral  SpO2: (!) 87% 92% 98% 98%  Weight:      Height:        Intake/Output Summary (Last 24 hours) at 07/23/2021 1234 Last data filed at 07/23/2021 0408 Gross per 24 hour  Intake 260.33 ml  Output 550 ml  Net -289.67 ml   Filed Weights   06/25/21 0900 07/01/21 1518 07/08/21 0958  Weight: (!) 218 kg (!) 218 kg (!) 218 kg    Examination:  General exam: Morbidly obese gentleman laying  in bed on continuous BiPAP.   Anxious.  In mild distress today.  Sick looking.  Respiratory system: Poor bilateral air entry but no added sounds.  Difficult to auscultate. Cardiovascular system: S1 & S2 heard, RRR.  Gastrointestinal system: Soft.  Obese and pendulous.  Bowel sounds present.  Nontender.   Central nervous system: Alert and oriented. No focal neurological deficits.  Generalized weakness. Extremities:  Right lower extremity with chronic venous stasis changes and pigmentation, 2+ edema. Left lower extremity with big open wound anterior thigh with serosanguineous discharge, external fixator in place.   Reemergence of dorsum edema today. He does have scrotal edema.    Data Reviewed: I have personally reviewed following labs and imaging studies  CBC: Recent Labs  Lab 07/19/21 0148 07/20/21 0420 07/21/21 0259 07/22/21 0419 07/23/21 0444  WBC 7.9 7.2 6.1 6.1 7.0  HGB 7.8* 7.7* 7.0* 7.2* 7.2*  HCT 26.9* 26.5* 23.9* 24.8* 25.2*  MCV 95.4 94.0 92.6 92.5 94.0  PLT 441* 450* 463* 470* 413*   Basic Metabolic Panel: Recent Labs  Lab 07/17/21 0343 07/18/21 0337 07/18/21 2128 07/20/21 0946 07/21/21 0259 07/22/21 0419 07/23/21 0444  NA 136 135 134* 135 134* 135 135  K 5.5* 5.2* 5.4* 4.8 4.5 4.4 4.6  CL 106 105 104 102 102 102 103  CO2 24 21* 20* 21* 25 24 24   GLUCOSE 172* 193* 212*  230* 188* 123* 151*  BUN 80* 91* 99* 104* 113* 128* 139*  CREATININE 1.35* 1.46* 1.58* 1.77* 1.82* 1.88* 2.29*  CALCIUM 9.1 9.0 8.8* 8.8* 8.9 9.0 8.9  MG  --   --  2.2  --   --   --   --   PHOS 4.2 4.4  --   --  4.6 4.3 5.2*   GFR: Estimated Creatinine Clearance: 62.3 mL/min (A) (by C-G formula based on SCr of 2.29 mg/dL (H)). Liver Function Tests: Recent Labs  Lab 07/18/21 6967 07/18/21 2128 07/21/21 0259 07/22/21 0419 07/23/21 0444  AST  --  16  --   --   --   ALT  --  7  --   --   --   ALKPHOS  --  86  --   --   --   BILITOT  --  1.0  --   --   --   PROT  --  5.9*  --   --    --   ALBUMIN 2.4* 2.3* 2.4* 2.8* 2.6*   No results for input(s): LIPASE, AMYLASE in the last 168 hours. No results for input(s): AMMONIA in the last 168 hours. Coagulation Profile: Recent Labs  Lab 07/19/21 0148 07/20/21 0420 07/21/21 0259 07/22/21 0419 07/23/21 0444  INR 4.6* 5.6* 1.7* 1.5* 1.6*   Cardiac Enzymes: Recent Labs  Lab 07/17/21 1635  CKTOTAL 16*   BNP (last 3 results) No results for input(s): PROBNP in the last 8760 hours. HbA1C: No results for input(s): HGBA1C in the last 72 hours. CBG: Recent Labs  Lab 07/22/21 1142 07/22/21 1524 07/22/21 2142 07/23/21 0814 07/23/21 1135  GLUCAP 106* 97 133* 157* 149*   Lipid Profile: No results for input(s): CHOL, HDL, LDLCALC, TRIG, CHOLHDL, LDLDIRECT in the last 72 hours. Thyroid Function Tests: No results for input(s): TSH, T4TOTAL, FREET4, T3FREE, THYROIDAB in the last 72 hours. Anemia Panel: Recent Labs    07/21/21 0259  FERRITIN 151  TIBC 195*  IRON 25*   Sepsis Labs: No results for input(s): PROCALCITON, LATICACIDVEN in the last 168 hours.  No results found for this or any previous visit (from the past 240 hour(s)).       Radiology Studies: No results found.      Scheduled Meds:  (feeding supplement) PROSource Plus  30 mL Oral BID BM   vitamin C  1,000 mg Oral Daily   cephALEXin  250 mg Oral QHS   darbepoetin (ARANESP) injection - NON-DIALYSIS  200 mcg Subcutaneous Q Mon-1800   docusate sodium  100 mg Oral Daily   feeding supplement (GLUCERNA SHAKE)  237 mL Oral Q24H   FLUoxetine  60 mg Oral QPM   furosemide  80 mg Intravenous Q12H   insulin aspart  0-20 Units Subcutaneous TID WC   insulin aspart  0-5 Units Subcutaneous QHS   insulin aspart  3 Units Subcutaneous TID WC   insulin glargine-yfgn  55 Units Subcutaneous QHS   methocarbamol  1,000 mg Oral BID   multivitamin with minerals  1 tablet Oral Daily   nutrition supplement (JUVEN)  1 packet Oral BID BM   oxyCODONE  10 mg Oral  Daily   pantoprazole  40 mg Oral Daily   pioglitazone  45 mg Oral Daily   polyethylene glycol  17 g Oral BID   Ensure Max Protein  11 oz Oral Daily   senna  1 tablet Oral Daily   Warfarin - Pharmacist Dosing Inpatient   Does  not apply q1600   Continuous Infusions:  sodium chloride 10 mL/hr at 07/13/21 1107   ferric gluconate (FERRLECIT) IVPB 250 mg (07/23/21 0935)     LOS: 41 days    Time spent: 30 minutes    Barb Merino, MD [facility] Pager [phone]

## 2021-07-23 NOTE — Progress Notes (Signed)
ANTICOAGULATION CONSULT NOTE - Follow Up Consult  Pharmacy Consult for Warfarin Indication: h/o pulmonary embolus  Allergies  Allergen Reactions   Tape     Peels skin on legs   Chlorhexidine Rash    Wipes    Patient Measurements: Height: 6\' 1"  (185.4 cm) Weight: (!) 218 kg (480 lb 9.6 oz) IBW/kg (Calculated) : 79.9  Vital Signs: Temp: 97.6 F (36.4 C) (10/27 0818) Temp Source: Oral (10/27 0818) BP: 141/42 (10/27 0818) Pulse Rate: 80 (10/27 0818)  Labs: Recent Labs    07/21/21 0259 07/22/21 0419 07/23/21 0444  HGB 7.0* 7.2* 7.2*  HCT 23.9* 24.8* 25.2*  PLT 463* 470* 471*  LABPROT 19.8* 17.8* 19.3*  INR 1.7* 1.5* 1.6*  CREATININE 1.82* 1.88* 2.29*     Estimated Creatinine Clearance: 62.3 mL/min (A) (by C-G formula based on SCr of 2.29 mg/dL (H)).   Medications:  PTA Warfarin: 5 mg on Sun/Mon/Wed/Fri evening, and 7.5 mg on Tue/Thur/Sat evening.   INR was supratherapeutic (6.2) at admission  Assessment: 82 YOM on lifelong warfarin therapy for hx of PE, body habitus, and sedentary lifestyle admitted on 9/15 with left knee dislocation and effusion concerning for hematoma. At admission, warfarin was held, vitamin K was given for supratherapeutic INR (6.2), and pt was transfused.   Patient resumed on warfarin, but doses held 10/22-10/24 due to supratherapeutic INR (up to 5.6 on 10/24). S/p vitamin K 5mg  IV x 1 on 10/23 and 10/24. Warfarin resumed 10/25 PM.  INR today remains SUBtherapeutic (INR 1.7 << 1.5, goal of 2-3). Hgb 7.2 - low but stable. Per renal today - to start holding warfarin for plans for HD catheter placement  Goal of Therapy:  INR 2-3 Monitor platelets by anticoagulation protocol: Yes   Plan:  - Hold Warfarin - F/u IR plans for HD cath - Will continue to monitor for any signs/symptoms of bleeding and will follow up with PT/INR in the a.m.    Thank you for allowing pharmacy to be a part of this patient's care.  Alycia Rossetti, PharmD,  BCPS Clinical Pharmacist Clinical phone for 07/23/2021: [phone] 07/23/2021 8:29 AM   **Pharmacist phone directory can now be found on [email or web address] ([email or web address]).  Listed under [place].

## 2021-07-23 NOTE — Plan of Care (Signed)

## 2021-07-23 NOTE — H&P (Signed)
Chief Complaint: Acute onchronic renal failure  Referring Physician(s): Corliss Parish  Supervising Physician: Ruthann Cancer  Patient Status: [name] - In-pt  History of Present Illness: Joseph [name]. is a 65 y.o. male with super morbid obesity, sleep apnea on CPAP, type 2 diabetes, hypertension, hyperlipidemia, peripheral artery disease, DVT on Coumadin, GERD, anxiety and depression.  Per chart, he has had multiple falls.  He presented to the emergency room on 9/15 after a fall at home resulting in left knee pain.    He was found to have a left knee dislocation and fracture fragments with left knee effusion.    He underwent close reduction in the ER and admitted to the hospital.  He underwent left knee extensive debridement, reduction of dislocation, external fixation with wound VAC application for chronic left thigh wound.  His hospitalization has been prolonged due to severe physical deconditioning and remains with very poor respiratory status.    His renal function has continued to worsen and Nephrology was consulted.  Nephrology note reads = ... cannot determine an etiology for his changed renal function.  u/s and another U/A not giving any clues-  seeming like pre renal insult, ? Cardiorenal?   all nephrotoxic meds have been discontinued.   trying and maximize renal perfusion so limiting what we can in terms of pain meds and muscle relaxers-  have stopped his prn hydralazine-  u/s does not show obstruction - have stopped his flomax.  Performing dialysis in this gentleman would be very problematic due to his size and his limited mobility and it is doubtful that he would get to a point where he would be appropriate for OP HD logistically -  may consider some HD in the short term  We are asked to place a tunneled hemodialysis catheter.  He has been NPO since 9 am. INR 1.6  Past Medical History:  Diagnosis Date   Anxiety state, unspecified    Aortic atherosclerosis  ([place]) 06/12/2021   Depression    DVT (deep venous thrombosis) ([place]) 2004   Left knee   Edema    Esophageal reflux    occ   Essential hypertension, benign    Essential hypertension, benign    Falls frequently    History of iron deficiency anemia    Morbid obesity (HCC)    Myalgia and myositis, unspecified    BACK AND LEGS   OSA on CPAP    Peripheral vascular disease, unspecified ([place])    Personal history of PE (pulmonary embolism) 2004   Polyneuropathy in diabetes(357.2)    Poor venous access    PT STATES DIFFICULT TO DRAW BLOOD FROM HIS VEINS   Pure hypercholesterolemia    Shortness of breath    DUE TO WEIGHT AND LOSS OF MOBILITY   Type II or unspecified type diabetes mellitus with neurological manifestations, not stated as uncontrolled(250.60)    Type II or unspecified type diabetes mellitus without mention of complication, not stated as uncontrolled    Type II or unspecified type diabetes mellitus without mention of complication, uncontrolled    Varicose veins of lower extremities with inflammation    Wears glasses    Wound infection after surgery    required hospitalization    Past Surgical History:  Procedure Laterality Date   AMPUTATION TOE Bilateral 04/03/2020   Procedure: BILATERAL HALLUX AMPUTATION;  Surgeon: Felipa Furnace, DPM;  Location: [facility];  Service: Podiatry;  Laterality: Bilateral;   COLONOSCOPY WITH PROPOFOL N/A 07/20/2019  Procedure: COLONOSCOPY WITH PROPOFOL;  Surgeon: Wonda Horner, MD;  Location: [facility];  Service: Endoscopy;  Laterality: N/A;   HEMATOMA EVACUATION Left 07/01/2021   Procedure: LEFT KNEE REDUCTION EXTERNAL FIXATION WITH EVACUATION HEMATOMA;  Surgeon: Meredith Pel, MD;  Location: [facility];  Service: Orthopedics;  Laterality: Left;   I & D EXTREMITY Bilateral 04/05/2020   Procedure: IRRIGATION AND DEBRIDEMENT EXTREMITY WITH CLOSURE - BILATERALLY;  Surgeon: Felipa Furnace, DPM;  Location: [facility];  Service: Podiatry;  Laterality:  Bilateral;   I & D EXTREMITY Left 07/08/2021   Procedure: LEFT THIGH DEBRIDEMENT;  Surgeon: Newt Minion, MD;  Location: [place];  Service: Orthopedics;  Laterality: Left;   I & D EXTREMITY Left 07/10/2021   Procedure: LEFT THIGH DEBRIDEMENT;  Surgeon: Newt Minion, MD;  Location: [place];  Service: Orthopedics;  Laterality: Left;   NO PAST SURGERIES     PANNICULECTOMY N/A 07/15/2014   Procedure: PANNICULECTOMY;  Surgeon: Pedro Earls, MD;  Location: [facility];  Service: General;  Laterality: N/A;   POLYPECTOMY  07/20/2019   Procedure: POLYPECTOMY;  Surgeon: Wonda Horner, MD;  Location: [facility];  Service: Endoscopy;;   SKIN SPLIT GRAFT Left 07/10/2021   Procedure: APPLY SKIN GRAFT;  Surgeon: Newt Minion, MD;  Location: [place];  Service: Orthopedics;  Laterality: Left;    Allergies: Tape and Chlorhexidine  Medications: Prior to Admission medications   Medication Sig Start Date End Date Taking? Authorizing Provider  acetaminophen (TYLENOL) 500 MG tablet Take 1,000 mg by mouth daily as needed for moderate pain or headache.    Yes [provider]  amLODipine (NORVASC) 10 MG tablet Take 10 mg by mouth every evening.    Yes [provider]  cephALEXin (KEFLEX) 250 MG capsule Take 250 mg by mouth at bedtime. 06/05/21  Yes [provider]  FLUoxetine (PROZAC) 40 MG capsule Take 40 mg by mouth every evening.    Yes [provider]  furosemide (LASIX) 40 MG tablet Take 40 mg by mouth daily.    Yes [provider]  indapamide (LOZOL) 2.5 MG tablet Take 5 mg by mouth daily.   Yes [provider]  insulin aspart (NOVOLOG) 100 UNIT/ML injection Inject 0-25 Units into the skin as needed for high blood sugar. Sliding Scale 06/08/21  Yes [provider]  LANTUS 100 UNIT/ML injection Inject 50 Units into the skin at bedtime. 06/08/21  Yes [provider]  metFORMIN (GLUCOPHAGE) 1000 MG tablet Take 1,000 mg by mouth 2 (two) times  daily with a meal.   Yes [provider]  omeprazole (PRILOSEC) 40 MG capsule Take 40 mg by mouth daily as needed (acid reflux).    Yes [provider]  pioglitazone (ACTOS) 45 MG tablet Take 45 mg by mouth daily.   Yes [provider]  pravastatin (PRAVACHOL) 40 MG tablet Take 40 mg by mouth every evening.    Yes [provider]  Prenatal Vit-Fe Fumarate-FA (PRENATAL FA PO) Take 1 tablet by mouth daily.   Yes [provider]  quinapril (ACCUPRIL) 40 MG tablet Take 40 mg by mouth daily.    Yes [provider]  silver sulfADIAZINE (SILVADENE) 1 % cream Apply 1 application topically daily. Patient taking differently: Apply 1 application topically daily as needed (wounds on toes and legs). 02/26/20  Yes Early, Arvilla Meres, MD  tamsulosin (FLOMAX) 0.4 MG CAPS capsule Take 0.4 mg by mouth daily. 04/02/21  Yes [provider]  warfarin (COUMADIN) 5 MG tablet Take 5-7.5 mg by mouth See admin instructions. Take 5 mg in the evening on Sun, Mon, Wed, and Fri. Take 7.5 mg in the evening on Tue, Thur, and Sat   Yes [provider]  Wheat Dextrin (BENEFIBER) POWD Take 15 mLs by mouth daily as needed (constipation). Mix in coffee and drink   Yes [provider]  Continuous Blood Gluc Sensor (FREESTYLE LIBRE 2 SENSOR) MISC APPLY EVERY 14 DAYS AS DIRECTED 03/28/20   [provider]  FREESTYLE LITE test strip TEST BLOOD SUGAR FOUR TIMES DAILY 02/11/20   [provider]     Family History  Problem Relation Age of Onset   Hypertension Father    Pulmonary embolism Father    Hypertension Other     Social History   Socioeconomic History   Marital status: Married    Spouse name: Not on file   Number of children: Not on file   Years of education: Not on file   Highest education level: Not on file  Occupational History   Not on file  Tobacco Use   Smoking status: Never   Smokeless tobacco: Never  Vaping Use   Vaping  Use: Never used  Substance and Sexual Activity   Alcohol use: Not Currently   Drug use: No   Sexual activity: Not on file  Other Topics Concern   Not on file  Social History Narrative   Not on file   Social Determinants of Health   Financial Resource Strain: Not on file  Food Insecurity: Not on file  Transportation Needs: Not on file  Physical Activity: Not on file  Stress: Not on file  Social Connections: Not on file     Review of Systems: A 12 point ROS discussed and pertinent positives are indicated in the HPI above.  All other systems are negative.  Review of Systems  Vital Signs: BP (!) 152/56 (BP Location: Left Arm)   Pulse 71   Temp 97.6 F (36.4 C) (Oral)   Resp 13   Ht 6\' 1"  (1.854 m)   Wt (!) 218 kg   SpO2 98%   BMI 63.41 kg/m   Physical Exam Vitals reviewed.  Constitutional:      Appearance: He is obese.  HENT:     Head: Normocephalic and atraumatic.  Cardiovascular:     Rate and Rhythm: Normal rate and regular rhythm.  Pulmonary:     Effort: Pulmonary effort is normal. No respiratory distress.     Comments: Currently wearing CPAP Skin:    General: Skin is warm and dry.  Neurological:     General: No focal deficit present.     Mental Status: He is alert and oriented to person, place, and time.  Psychiatric:        Mood and Affect: Mood normal.        Behavior: Behavior normal.        Thought Content: Thought content normal.        Judgment: Judgment normal.    Imaging: DG Knee 1-2 Views Left  Result Date: 07/01/2021 CLINICAL DATA:  Intraoperative repair EXAM: LEFT KNEE - 1-2 VIEW COMPARISON:  06/15/2021 FINDINGS: Ten low resolution intraoperative spot views of the left knee. Total fluoroscopy time was 26 seconds. Images were obtained during operative placement of external fixation device. IMPRESSION: Intraoperative fluoroscopic assistance provided during knee surgery Electronically Signed   By: Donavan Foil M.D.   On: 07/01/2021 21:32  US  RENAL  Result Date: 07/20/2021 CLINICAL DATA:  Acute kidney injury EXAM: RENAL / URINARY TRACT ULTRASOUND COMPLETE COMPARISON:  CT 06/11/2021 FINDINGS: Right Kidney: Renal measurements: 13 x 6.5 x 6.9 cm = volume: 302.6 mL. Echogenicity within normal limits. No mass or hydronephrosis visualized. Left Kidney: Unable to visualize due to bowel gas. Bladder: Unable to visualize due to habitus Other: None. IMPRESSION: 1. Normal ultrasound appearance of right kidney 2. Left kidney and bladder were unable to be visualized secondary to combination of habitus and gas. Electronically Signed   By: Donavan Foil M.D.   On: 07/20/2021 21:03   MR KNEE LEFT WO CONTRAST  Result Date: 06/26/2021 CLINICAL DATA:  Tibial plateau fracture after fall. EXAM: MRI OF THE LEFT KNEE WITHOUT CONTRAST TECHNIQUE: Multiplanar, multisequence MR imaging of the knee was performed. No intravenous contrast was administered. COMPARISON:  Left knee x-rays dated June 15, 2021. CT left knee dated June 11, 2021. FINDINGS: MENISCI Medial meniscus: Bucket-handle type tear with the posterior horn flipped and displaced anteriorly, lying superior to the anterior horn (series 14, image 14; series 11, image 17). Lateral meniscus: Free edge fraying of the posterior horn (series 14, image 22). LIGAMENTS Cruciates: The ACL is completely ruptured. The PCL appears intact but there is a displaced avulsion fracture of the posterior tibial spine at its attachment. Collaterals: The medial collateral ligament is completely ruptured. The fibular collateral ligament is avulsed from its femoral attachment. The biceps femoris tendon and iliotibial band are intact. CARTILAGE Patellofemoral:  No chondral defect. Medial: Small partial-thickness cartilage defect over the posterior weight-bearing medial femoral condyle. Lateral:  No chondral defect. Joint:  Small joint effusion. Popliteal Fossa:  No Baker cyst.  Completely torn popliteus tendon. Extensor Mechanism:  Completely ruptured MPFL and medial patellar retinaculum. Intact quadriceps tendon and patellar tendon. Bones: External rotation and subluxation of proximal tibia with respect to the distal femur, with near dislocation of the lateral compartment. Lateral dislocation of the patella. As above, avulsion fracture of the posterior tibial spine. Other: Large ill-defined fluid collection along the medial aspect of the distal femur containing fat, fluid, and hemorrhage, consistent with degloving injury. Complete tears of the medial and lateral gastrocnemius tendons from the distal femur. IMPRESSION: 1. Unstable knee joint with external rotation and subluxation of the proximal tibia, with near dislocation of the lateral compartment. Lateral dislocation of the patella. 2. Bucket-handle type tear of the medial meniscus with the posterior horn flipped and displaced anteriorly, lying superior to the anterior horn. 3. Complete tears of the ACL, MCL, and MPFL. Complete avulsions of the fibular collateral ligament and popliteus tendon from the distal femur. 4. Avulsion fracture of the tibial spine at the PCL attachment. 5. Complete tears of the medial and lateral gastrocnemius tendons from the distal femur. 6. Large degloving injury along the medial aspect of the distal femur. Electronically Signed   By: Titus Dubin M.D.   On: 06/26/2021 14:44   DG CHEST PORT 1 VIEW  Result Date: 07/20/2021 CLINICAL DATA:  65 year old male with shortness of breath. EXAM: PORTABLE CHEST 1 VIEW COMPARISON:  Portable chest 07/18/2021 and earlier. FINDINGS: Portable AP semi upright view at 0900 hours. Rotated to the left similar to before. Mildly larger lung volumes. Mediastinal contours are stable and within normal limits. Abnormal course bilateral pulmonary interstitial opacity. Less confluence of bilateral perihilar opacity. No pneumothorax, pleural effusion or air bronchograms. Paucity of bowel gas in the visible upper abdomen. No acute  osseous abnormality identified. IMPRESSION:  Larger lung volumes with ongoing course bilateral pulmonary interstitial opacity. Bilateral perihilar opacity is less confluent. Top differential considerations are acute viral/atypical respiratory infection versus pulmonary edema. No pleural effusion is evident. Electronically Signed   By: Genevie Ann M.D.   On: 07/20/2021 09:09   DG CHEST PORT 1 VIEW  Result Date: 07/18/2021 CLINICAL DATA:  Shortness of breath. EXAM: PORTABLE CHEST 1 VIEW COMPARISON:  April 03, 2020 FINDINGS: Moderate to marked severity diffusely increased interstitial lung markings are seen, slightly more prominent within the perihilar regions, bilaterally. There is no evidence of a pleural effusion or pneumothorax. The heart size and mediastinal contours are within normal limits. The visualized skeletal structures are unremarkable. IMPRESSION: Moderate to marked severity pulmonary edema. Electronically Signed   By: Virgina Norfolk M.D.   On: 07/18/2021 21:09   DG Knee Right Port  Result Date: 06/25/2021 CLINICAL DATA:  Fall. EXAM: PORTABLE RIGHT KNEE - 1-2 VIEW COMPARISON:  None. FINDINGS: The bones are osteopenic. There is no acute fracture or dislocation identified. Joint spaces are maintained. Small joint effusion is present. There are vascular calcifications in the soft tissues. IMPRESSION: No acute bony abnormality. Joint effusion Electronically Signed   By: Ronney Asters M.D.   On: 06/25/2021 16:07   DG C-Arm 1-60 Min-No Report  Result Date: 07/01/2021 Fluoroscopy was utilized by the requesting physician.  No radiographic interpretation.   ECHOCARDIOGRAM COMPLETE  Result Date: 07/03/2021    ECHOCARDIOGRAM REPORT   Patient Name:   Joseph [name]. Date of Exam: 07/03/2021 Medical Rec #:  [id number]        Height:       73.0 in Accession #:    [id number]       Weight:       480.6 lb Date of Birth:  08-May-1956        BSA:          3.123 m Patient Age:    57 years         BP:            130/51 mmHg Patient Gender: M                HR:           39 bpm. Exam Location:  Inpatient Procedure: 2D Echo, Color Doppler, Cardiac Doppler and Intracardiac            Opacification Agent Indications:    Bradycardia  History:        Patient has prior history of Echocardiogram examinations, most                 recent 01/03/2003. Risk Factors:Hypertension, Diabetes and                 Dyslipidemia.  Sonographer:    Maudry Mayhew MHA, RDMS, RVT, RDCS Referring Phys: [id number] PAULA V ROSS  Sonographer Comments: Technically challenging study due to limited acoustic windows, suboptimal parasternal window, suboptimal apical window, suboptimal subcostal window and patient is morbidly obese. IMPRESSIONS  1. Left ventricular ejection fraction, by estimation, is 60 to 65%. The left ventricle has normal function. Left ventricular endocardial border not optimally defined to evaluate regional wall motion. Left ventricular diastolic parameters are indeterminate.  2. Right ventricular systolic function is normal. The right ventricular size is normal.  3. The mitral valve was not well visualized. No evidence of mitral valve regurgitation. No evidence of mitral stenosis.  4. The aortic valve was not well visualized. Aortic  valve regurgitation is not visualized. No aortic stenosis is present.  5. The inferior vena cava is normal in size with greater than 50% respiratory variability, suggesting right atrial pressure of 3 mmHg. FINDINGS  Left Ventricle: Left ventricular ejection fraction, by estimation, is 60 to 65%. The left ventricle has normal function. Left ventricular endocardial border not optimally defined to evaluate regional wall motion. The left ventricular internal cavity size was normal in size. There is no left ventricular hypertrophy. Left ventricular diastolic parameters are indeterminate. Right Ventricle: The right ventricular size is normal. No increase in right ventricular wall thickness. Right ventricular systolic  function is normal. Left Atrium: Left atrial size was normal in size. Right Atrium: Right atrial size was normal in size. Pericardium: There is no evidence of pericardial effusion. Presence of pericardial fat pad. Mitral Valve: The mitral valve was not well visualized. Mild mitral annular calcification. No evidence of mitral valve regurgitation. No evidence of mitral valve stenosis. Tricuspid Valve: The tricuspid valve is not well visualized. Tricuspid valve regurgitation is not demonstrated. No evidence of tricuspid stenosis. Aortic Valve: The aortic valve was not well visualized. Aortic valve regurgitation is not visualized. No aortic stenosis is present. Aortic valve mean gradient measures 2.0 mmHg. Aortic valve peak gradient measures 3.8 mmHg. Aortic valve area, by VTI measures 2.83 cm. Pulmonic Valve: The pulmonic valve was normal in structure. Pulmonic valve regurgitation is not visualized. No evidence of pulmonic stenosis. Aorta: The aortic root is normal in size and structure. Venous: The inferior vena cava is normal in size with greater than 50% respiratory variability, suggesting right atrial pressure of 3 mmHg. IAS/Shunts: No atrial level shunt detected by color flow Doppler.  LEFT VENTRICLE PLAX 2D LVOT diam:     1.90 cm LV SV:         55 LV SV Index:   18 LVOT Area:     2.84 cm  RIGHT VENTRICLE RV S prime:     12.80 cm/s LEFT ATRIUM           Index LA Vol (A4C): 22.7 ml 7.27 ml/m  AORTIC VALVE AV Area (Vmax):    2.89 cm AV Area (Vmean):   2.85 cm AV Area (VTI):     2.83 cm AV Vmax:           97.40 cm/s AV Vmean:          71.400 cm/s AV VTI:            0.195 m AV Peak Grad:      3.8 mmHg AV Mean Grad:      2.0 mmHg LVOT Vmax:         99.35 cm/s LVOT Vmean:        71.800 cm/s LVOT VTI:          0.194 m LVOT/AV VTI ratio: 1.00  AORTA Ao Root diam: 2.70 cm MR Peak grad: 9.1 mmHg MR Vmax:      151.00 cm/s SHUNTS                           Systemic VTI:  0.19 m                           Systemic Diam:  1.90 cm Candee Furbish MD Electronically signed by Candee Furbish MD Signature Date/Time: 07/03/2021/11:03:35 AM    Final     Labs:  CBC: Recent Labs  07/20/21 0420 07/21/21 0259 07/22/21 0419 07/23/21 0444  WBC 7.2 6.1 6.1 7.0  HGB 7.7* 7.0* 7.2* 7.2*  HCT 26.5* 23.9* 24.8* 25.2*  PLT 450* 463* 470* 471*    COAGS: Recent Labs    07/20/21 0420 07/21/21 0259 07/22/21 0419 07/23/21 0444  INR 5.6* 1.7* 1.5* 1.6*    BMP: Recent Labs    07/20/21 0946 07/21/21 0259 07/22/21 0419 07/23/21 0444  NA 135 134* 135 135  K 4.8 4.5 4.4 4.6  CL 102 102 102 103  CO2 21* 25 24 24   GLUCOSE 230* 188* 123* 151*  BUN 104* 113* 128* 139*  CALCIUM 8.8* 8.9 9.0 8.9  CREATININE 1.77* 1.82* 1.88* 2.29*  GFRNONAA 42* 41* 39* 31*    LIVER FUNCTION TESTS: Recent Labs    06/24/21 0507 07/10/21 0115 07/13/21 0432 07/14/21 0336 07/18/21 2128 07/21/21 0259 07/22/21 0419 07/23/21 0444  BILITOT 0.9 0.6 0.6  --  1.0  --   --   --   AST 16 12* 11*  --  16  --   --   --   ALT 16 5 <5  --  7  --   --   --   ALKPHOS 89 78 73  --  86  --   --   --   PROT 5.9* 5.2* 5.4*  --  5.9*  --   --   --   ALBUMIN 2.4* 1.8* 1.9*   < > 2.3* 2.4* 2.8* 2.6*   < > = values in this interval not displayed.    TUMOR MARKERS: No results for input(s): AFPTM, CEA, CA199, CHROMGRNA in the last 8760 hours.  Assessment and Plan:  Progressive renal failure - Creatinine now 2.29 and BUN 139.  Will proceed with image guided placement of a tunneled hemodialysis catheter as IR schedule allows. (Unsure if it will be today, most likely tomorrow)  Risks and benefits discussed with the patient including, but not limited to bleeding, infection, vascular injury, pneumothorax which may require chest tube placement, air embolism or even death  All of the patient's questions were answered, patient is agreeable to proceed. Consent signed and in chart.  Thank you for allowing our service to participate in Joseph [name].  's care.  Electronically Signed: Murrell Redden, PA-C   07/23/2021, 2:19 PM      I spent a total of 20 Minutes in face to face in clinical consultation, greater than 50% of which was counseling/coordinating care for HD catheter.

## 2021-07-23 NOTE — Progress Notes (Signed)
Occupational Therapy Treatment Patient Details Name: Joseph [name]. MRN: [id number] DOB: 1956-05-08 Today's Date: 07/23/2021   History of present illness Joseph [name] is a 65 yo male who presnets with increased LLE swelling after recent fall at home. 06/11/21 pt fell when getting off x-ray table at hospital. X-rays showed a knee dislocation which was confirmed on CT scan as a posterior lateral dislocation; L knee reduced and L KI placed. Lumbar and pelvic xrays negative. 10/5: left knee dislocation reduction with external fixation placement and extensive debridement of hematoma and devitalized skin subtenons tissue muscle fascia Placement of wound VAC; Wound debridement on 10/12. 10/14 L knee and thigh debridement with skin graft, now WBAT L LE.  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   OT comments  Pt weaker with increased edema, increased lethargy and slower processing than last visit. Pt only tolerated repositioning in bed with use of reverse trendelenburg to increase HOB to improve comfort with breathing. VSS however. Recommend Prevalon boot for R foot as well due to increased weakness and risk of developing pressure area R heel. Encouraged incentive spirometer and using bed to assist with more upright positioning. Pt has therabnd to work on when alert. Pt's wife tearful about situation. Recommend palliative care consult. Will continue to follow.    Recommendations for follow up therapy are one component of a multi-disciplinary discharge planning process, led by the attending physician.  Recommendations may be updated based on patient status, additional functional criteria and insurance authorization.    Follow Up Recommendations  Skilled nursing-short term rehab (<3 hours/day)    Assistance Recommended at Discharge    Equipment Recommendations  BSC (bari)    Recommendations for Other Services  (Palliative Care Consult)    Precautions /  Restrictions Precautions Precautions: Fall Precaution Comments: LLE external fixation; anxiety; large pannus - foam above external fixator; MASD under pannus; wound vac; L heel pressure wound Restrictions LLE Weight Bearing: Weight bearing as tolerated       Mobility Bed Mobility Overal bed mobility: Needs Assistance Bed Mobility: Rolling Rolling: Max assist;+2 for physical assistance              Transfers   Equipment used:  (maxi slide)                     Balance Overall balance assessment: Needs assistance Sitting-balance support: Single extremity supported Sitting balance-Leahy Scale: Fair Sitting balance - Comments: periodic reaches for foot board and rail for support and shift weight                                   ADL either performed or assessed with clinical judgement   ADL Overall ADL's : Needs assistance/impaired Eating/Feeding: Independent   Grooming: Set up   Upper Body Bathing: Bed level;Moderate assistance   Lower Body Bathing: Bed level;Total assistance   Upper Body Dressing : Maximal assistance;Bed level Upper Body Dressing Details (indicate cue type and reason): don shirt Lower Body Dressing: Total assistance;Bed level;+2 for physical assistance       Toileting- Clothing Manipulation and Hygiene: Total assistance       Functional mobility during ADLs:  (tilt bed)       Vision       Perception     Praxis      Cognition Arousal/Alertness: Lethargic Behavior During Therapy: Flat affect Overall Cognitive  Status: Impaired/Different from baseline Area of Impairment: Attention;Memory;Safety/judgement;Awareness;Problem solving                   Current Attention Level: Sustained Memory: Decreased short-term memory   Safety/Judgement: Decreased awareness of deficits Awareness: Emergent Problem Solving: Slow processing General Comments:  (benefits from encouragement, family present to assist in  encouragement. PT assists in positioning of LLE, utilizing KREG bed strap to wrap around L leg prevent LLE from abducting into gap between cushions. No pressure areas observed, RN aware)          Exercises Exercises: General Upper Extremity General Exercises - Upper Extremity Shoulder Flexion: AROM;Both;5 reps;Theraband Theraband Level (Shoulder Flexion):  (orange level 2) Shoulder ABduction: AROM;Right;5 reps;Theraband Theraband Level (Shoulder Abduction):  (orange level 2) Shoulder Horizontal ABduction: AROM;5 reps;Theraband Theraband Level (Shoulder Horizontal Abduction):  (orange level 2) Shoulder Horizontal ADduction: AROM;5 reps;Right;Theraband Theraband Level (Shoulder Horizontal Adduction):  (orange level 2) Elbow Flexion: AROM;Right;5 reps;Theraband Theraband Level (Elbow Flexion):  (orange level 2) Elbow Extension: Right;AROM;Theraband Theraband Level (Elbow Extension):  (orange level 2) General Exercises - Lower Extremity Quad Sets:  (semi-standing in tilt bed, facilitating knee extension) Heel Slides: Right;5 reps;AAROM Hip ABduction/ADduction: AAROM;Right;10 reps;Supine Mini-Sqauts:  (at 24 degrees)   Shoulder Instructions       General Comments      Pertinent Vitals/ Pain       Pain Assessment: 0-10 Pain Score: 5  Pain Location: LLE Pain Descriptors / Indicators: Grimacing Pain Intervention(s): Limited activity within patient's tolerance  Home Living                                          Prior Functioning/Environment              Frequency  Min 2X/week        Progress Toward Goals  OT Goals(current goals can now be found in the care plan section)  Progress towards OT goals: Not progressing toward goals - comment  Acute Rehab OT Goals OT Goal Formulation: With patient/family Time For Goal Achievement: 07/30/21 Potential to Achieve Goals: Fair ADL Goals Pt Will Perform Upper Body Bathing: bed level Pt/caregiver will  Perform Home Exercise Program: Increased strength;Both right and left upper extremity;With theraband Additional ADL Goal #1: Ptwill roll R/L with 1 person min A in preparation for ADL tasks Additional ADL Goal #2: Pt will tolerate standing in Tilt bed x 15 min at 45 degrees to improve stadning tolerance for ADL andmoblity Additional ADL Goal #3: Pt will verbalize 2 strategies to reduce risk of MASD under pannus  Plan Discharge plan remains appropriate    Co-evaluation    PT/OT/SLP Co-Evaluation/Treatment: Yes            AM-PAC OT "6 Clicks" Daily Activity     Outcome Measure   Help from another person eating meals?: None Help from another person taking care of personal grooming?: A Little Help from another person toileting, which includes using toliet, bedpan, or urinal?: Total Help from another person bathing (including washing, rinsing, drying)?: A Lot Help from another person to put on and taking off regular upper body clothing?: A Lot Help from another person to put on and taking off regular lower body clothing?: Total 6 Click Score: 13    End of Session Equipment Utilized During Treatment: Oxygen (CPAP)  OT Visit Diagnosis: Unsteadiness on feet (R26.81);Other abnormalities of gait  and mobility (R26.89);Muscle weakness (generalized) (M62.81);History of falling (Z91.81);Pain Pain - Right/Left: Left Pain - part of body: Leg   Activity Tolerance Patient limited by fatigue;Patient limited by lethargy   Patient Left in bed;with call bell/phone within reach;with family/visitor present   Nurse Communication Other (comment) (pressure area L heel; needs Prevalon for R foot)        Time: 5374-8270 OT Time Calculation (min): 30 min  Charges: OT General Charges $OT Visit: 1 Visit OT Treatments $Therapeutic Activity: 23-37 mins  Maurie Boettcher, OT/L   Acute OT Clinical Specialist [place] Pager [phone] Office [phone]    [facility] 07/23/2021, 10:39 AM

## 2021-07-24 ENCOUNTER — Inpatient Hospital Stay (HOSPITAL_COMMUNITY): Payer: HMO

## 2021-07-24 DIAGNOSIS — S83105A Unspecified dislocation of left knee, initial encounter: Secondary | ICD-10-CM | POA: Diagnosis not present

## 2021-07-24 HISTORY — PX: IR US GUIDE VASC ACCESS RIGHT: IMG2390

## 2021-07-24 LAB — RENAL FUNCTION PANEL
Albumin: 2.8 g/dL — ABNORMAL LOW (ref 3.5–5.0)
Anion gap: 11 (ref 5–15)
BUN: 145 mg/dL — ABNORMAL HIGH (ref 8–23)
CO2: 21 mmol/L — ABNORMAL LOW (ref 22–32)
Calcium: 9 mg/dL (ref 8.9–10.3)
Chloride: 102 mmol/L (ref 98–111)
Creatinine, Ser: 2.69 mg/dL — ABNORMAL HIGH (ref 0.61–1.24)
GFR, Estimated: 26 mL/min — ABNORMAL LOW (ref >60)
Glucose, Bld: 152 mg/dL — ABNORMAL HIGH (ref 70–99)
Phosphorus: 5.2 mg/dL — ABNORMAL HIGH (ref 2.5–4.6)
Potassium: 4.9 mmol/L (ref 3.5–5.1)
Sodium: 134 mmol/L — ABNORMAL LOW (ref 135–145)

## 2021-07-24 LAB — GLUCOSE, CAPILLARY
Glucose-Capillary: 155 mg/dL — ABNORMAL HIGH (ref 70–99)
Glucose-Capillary: 141 mg/dL — ABNORMAL HIGH (ref 70–99)
Glucose-Capillary: 118 mg/dL — ABNORMAL HIGH (ref 70–99)
Glucose-Capillary: 109 mg/dL — ABNORMAL HIGH (ref 70–99)

## 2021-07-24 LAB — PROTIME-INR
INR: 1.5 — ABNORMAL HIGH (ref 0.8–1.2)
Prothrombin Time: 18.2 seconds — ABNORMAL HIGH (ref 11.4–15.2)

## 2021-07-24 LAB — HEPATITIS B SURFACE ANTIBODY,QUALITATIVE: Hep B S Ab: NONREACTIVE

## 2021-07-24 LAB — HEPATITIS B SURFACE ANTIGEN: Hepatitis B Surface Ag: NONREACTIVE

## 2021-07-24 MED ORDER — PROPOFOL 1000 MG/100ML IV EMUL
INTRAVENOUS | Status: AC
  Filled 2021-07-24: qty 200

## 2021-07-24 MED ORDER — CEFAZOLIN IN SODIUM CHLORIDE 3-0.9 GM/100ML-% IV SOLN: 3.0000 g | Freq: Once | INTRAVENOUS | Status: DC

## 2021-07-24 MED ORDER — HEPARIN SOD (PORK) LOCK FLUSH 100 UNIT/ML IV SOLN
INTRAVENOUS | Status: DC | PRN
  Administered 2021-07-24: 500 [IU] via INTRAVENOUS

## 2021-07-24 MED ORDER — LIDOCAINE HCL 1 % IJ SOLN
INTRAMUSCULAR | Status: DC | PRN
  Administered 2021-07-24: 10 mL via INTRADERMAL

## 2021-07-24 MED ORDER — HEPARIN SOD (PORK) LOCK FLUSH 100 UNIT/ML IV SOLN
INTRAVENOUS | Status: AC
  Filled 2021-07-24: qty 5

## 2021-07-24 MED ORDER — LIDOCAINE HCL 1 % IJ SOLN
INTRAMUSCULAR | Status: AC
  Filled 2021-07-24: qty 20

## 2021-07-24 MED ORDER — HEPARIN SODIUM (PORCINE) 1000 UNIT/ML IJ SOLN
INTRAMUSCULAR | Status: AC
  Filled 2021-07-24: qty 1

## 2021-07-24 MED ORDER — HEPARIN SODIUM (PORCINE) 1000 UNIT/ML IJ SOLN
INTRAMUSCULAR | Status: DC | PRN
  Administered 2021-07-24: 2400 [IU] via INTRAVENOUS

## 2021-07-24 MED ORDER — HEPARIN SODIUM (PORCINE) 1000 UNIT/ML IJ SOLN
INTRAMUSCULAR | Status: AC
  Administered 2021-07-24: 1000 [IU]
  Filled 2021-07-24: qty 3

## 2021-07-24 NOTE — Procedures (Signed)
Interventional Radiology Procedure Note  Procedure: Temporary HD cathter  Indication: Renal failure  Findings: Please refer to procedural dictation for full description.  Complications: None  EBL: < 10 mL  Miachel Roux, MD [phone]

## 2021-07-24 NOTE — Progress Notes (Signed)
PROGRESS NOTE    Joseph [name].  DEY:[id number] DOB: May 11, 1956 DOA: 06/11/2021 PCP: Donald Prose, MD    Brief Narrative:  65 year old gentleman with history of super morbid obesity, history of sleep apnea on CPAP, type 2 diabetes, hypertension, hyperlipidemia, peripheral artery disease, DVT on Coumadin, GERD, anxiety and depression and multiple falls presented to the emergency room on 9/15 after a fall at home resulting in left knee pain.  He was found to have a left knee dislocation and fracture fragments with left knee effusion.  Underwent close reduction in the ER and admitted to the hospital. 9/15, admitted with close reduction but continues pain. 9/30, MRI with significant ligamentous injury and lateral patellar dislocation.  Left knee extensive debridement, reduction of dislocation, external fixation with wound VAC application for chronic left thigh wound. On CPAP for sleep apnea Also received 2 units of PRBC and 1 unit of FFP for supratherapeutic INR during hospital course. 10/22, noted shortness of breath on talking.  Hospitalization prolonged with severe physical deconditioning and remains with very poor respiratory status.  Nephrology consulted. 10/28, decided to start on hemodialysis due to persistent fluid retention and decreased urine output.   Assessment & Plan:   Principal Problem:   Left knee dislocation, initial encounter Active Problems:   Pure hypercholesterolemia   Essential hypertension, benign   OSA (obstructive sleep apnea)   Hx pulmonary embolism   Esophageal reflux   Diabetes mellitus type 2, uncontrolled   Diabetic neuropathy (HCC)   Acute renal failure superimposed on stage 3b chronic kidney disease (HCC)   Normocytic anemia   Class 3 obesity (HCC)   Aortic atherosclerosis (HCC)   Coronary atherosclerosis   Effusion of left knee   Left knee dislocation   Pressure injury of skin   Unstageable pressure ulcer of sacral region ([place])   At risk for  hemorrhage associated with anticoagulation therapy   Ulcer of left knee, with necrosis of bone ([place])   Open knee wound, left, subsequent encounter   Moderate episode of recurrent major depressive disorder ([place])  Fall, left knee hematoma and dislocation: Surgical reduction with external fixation, hematoma evacuation and skin grafting by Dr. Sharol Given. Skin graft failed, now has open wound and receiving daily dressing.  Followed by surgery. Mobility has been challenging. Weightbearing as tolerated if he can. Adequate pain medications. Currently on oxycodone IR 10 mg daily, 5 mg every 4 hours as needed and IV Dilaudid.  Also on Robaxin.  Still using IV pain medications for intermittent pain control. Difficulty healing his left thigh wound due to immobility and multiple medical factors.  Anasarca and pulmonary edema and acute renal failure: Likely secondary to severe hypoalbuminemia and fluid overload.  Intermittently treated with IV Lasix with no significant results. Significant leg edema and scrotal edema.  Urine output 150 mL last 24 hours. Ideally, may benefit with ultrafiltration with hemodialysis.  He may not be appropriate candidate for dialysis as outpatient.  Nephrology following. Patient's wife agreeable to consult palliative care to discuss about his pain management, goal of care.  Acute on chronic hypoxemic and hypercarbic respiratory failure, obstructive sleep apnea: Using CPAP at night.  Use CPAP most of the time with breaks for comfort. Intermittently gets shortness of breath and unable to maintain on CPAP, uses BiPAP.  Acute metabolic encephalopathy: Resolved.  Ativan discontinued and currently on Prozac 60 mg.  Delirium precautions.  Type 2 diabetes: Controlled.  On Lantus and Premeal insulin.  Also on Actos.  Essential hypertension: Blood pressure  is stable.  History of DVT/PE: On Coumadin.  Pharmacy managing.  Anxiety disorder: On Prozac every morning and hydroxyzine as needed.   Ativan discontinued.  Transient bradycardia and frequent PVCs: Asymptomatic in the context of sleep apnea.  Severe morbid obesity: BMI 64.  Anemia of chronic disease: Currently getting iron transfusions.  Hemoglobin 7-7.2.  Blood transfusion will put him on fluid overload.  If he goes to hemodialysis, will benefit with PRBC transfusion.  Physical deconditioning anxiety and lethargy limiting his ability to participate in PT. Poor recovery due to severe morbid obesity, nonhealing wounds, pain and unable to come off the CPAP around-the-clock.   DVT prophylaxis:   Therapeutic on Coumadin.  On hold for procedure.     Code Status: Full code. Family Communication: Wife at the bedside. Disposition Plan: Status is: Inpatient  Remains inpatient appropriate because: Around-the-clock CPAP. unsafe discharge because of active pain management, IV pain medications.  Renal function recovery.       Consultants:  Orthopedics Pulmonary critical care Nephrology  Procedures:  Multiple procedures applicable  Antimicrobials:  Multiple antibiotics, currently on Keflex   Subjective:  Patient seen and examined.  Overnight he stayed on CPAP.  Feels tired and bloated.  Wife at the bedside. Patient's wife had genuine questions about ongoing dialysis if that is needed.  We discussed that he may need to find a long-term nursing home with inpatient dialysis facilities and that may not be available in the area. We discussed that if patient becomes dialysis dependent, may need placement to faraway places. Wife has also agreed to discuss with palliative care team.  Objective: Vitals:   07/23/21 1953 07/23/21 2325 07/24/21 0443 07/24/21 0736  BP: (!) 146/45 (!) 139/42 (!) 146/39 (!) 158/58  Pulse: 72 73 73 71  Resp: 13 19 15 18   Temp: (!) 97.5 F (36.4 C) 97.6 F (36.4 C) 97.7 F (36.5 C) 97.9 F (36.6 C)  TempSrc: Oral Oral Oral Oral  SpO2: 100% 98% 95% 96%  Weight:      Height:         Intake/Output Summary (Last 24 hours) at 07/24/2021 1049 Last data filed at 07/23/2021 1557 Gross per 24 hour  Intake 280.38 ml  Output 150 ml  Net 130.38 ml   Filed Weights   06/25/21 0900 07/01/21 1518 07/08/21 0958  Weight: (!) 218 kg (!) 218 kg (!) 218 kg    Examination:  General exam: Morbidly obese gentleman laying in bed on CPAP.  Sick looking.  Anxious. Respiratory system: Poor bilateral air entry but no added sounds.  Difficult to auscultate. Cardiovascular system: S1 & S2 heard, RRR.  Gastrointestinal system: Soft.  Obese and pendulous.  Bowel sounds present.  Nontender.   Central nervous system: Alert and oriented. No focal neurological deficits.  Generalized weakness. Extremities:  Right lower extremity with chronic venous stasis changes and pigmentation, 2+ edema. Left lower extremity with big open wound anterior thigh with serosanguineous discharge, external fixator in place.   He does have scrotal edema. Tense edema of the dorsum of the feet.    Data Reviewed: I have personally reviewed following labs and imaging studies  CBC: Recent Labs  Lab 07/19/21 0148 07/20/21 0420 07/21/21 0259 07/22/21 0419 07/23/21 0444  WBC 7.9 7.2 6.1 6.1 7.0  HGB 7.8* 7.7* 7.0* 7.2* 7.2*  HCT 26.9* 26.5* 23.9* 24.8* 25.2*  MCV 95.4 94.0 92.6 92.5 94.0  PLT 441* 450* 463* 470* 347*   Basic Metabolic Panel: Recent Labs  Lab 07/18/21  3491 07/18/21 2128 07/20/21 0946 07/21/21 0259 07/22/21 0419 07/23/21 0444 07/24/21 0019  NA 135 134* 135 134* 135 135 134*  K 5.2* 5.4* 4.8 4.5 4.4 4.6 4.9  CL 105 104 102 102 102 103 102  CO2 21* 20* 21* 25 24 24  21*  GLUCOSE 193* 212* 230* 188* 123* 151* 152*  BUN 91* 99* 104* 113* 128* 139* 145*  CREATININE 1.46* 1.58* 1.77* 1.82* 1.88* 2.29* 2.69*  CALCIUM 9.0 8.8* 8.8* 8.9 9.0 8.9 9.0  MG  --  2.2  --   --   --   --   --   PHOS 4.4  --   --  4.6 4.3 5.2* 5.2*   GFR: Estimated Creatinine Clearance: 53 mL/min (A) (by C-G  formula based on SCr of 2.69 mg/dL (H)). Liver Function Tests: Recent Labs  Lab 07/18/21 2128 07/21/21 0259 07/22/21 0419 07/23/21 0444 07/24/21 0019  AST 16  --   --   --   --   ALT 7  --   --   --   --   ALKPHOS 86  --   --   --   --   BILITOT 1.0  --   --   --   --   PROT 5.9*  --   --   --   --   ALBUMIN 2.3* 2.4* 2.8* 2.6* 2.8*   No results for input(s): LIPASE, AMYLASE in the last 168 hours. No results for input(s): AMMONIA in the last 168 hours. Coagulation Profile: Recent Labs  Lab 07/20/21 0420 07/21/21 0259 07/22/21 0419 07/23/21 0444 07/24/21 0019  INR 5.6* 1.7* 1.5* 1.6* 1.5*   Cardiac Enzymes: Recent Labs  Lab 07/17/21 1635  CKTOTAL 16*   BNP (last 3 results) No results for input(s): PROBNP in the last 8760 hours. HbA1C: No results for input(s): HGBA1C in the last 72 hours. CBG: Recent Labs  Lab 07/23/21 0814 07/23/21 1135 07/23/21 1552 07/23/21 2142 07/24/21 0736  GLUCAP 157* 149* 130* 146* 155*   Lipid Profile: No results for input(s): CHOL, HDL, LDLCALC, TRIG, CHOLHDL, LDLDIRECT in the last 72 hours. Thyroid Function Tests: No results for input(s): TSH, T4TOTAL, FREET4, T3FREE, THYROIDAB in the last 72 hours. Anemia Panel: No results for input(s): VITAMINB12, FOLATE, FERRITIN, TIBC, IRON, RETICCTPCT in the last 72 hours.  Sepsis Labs: No results for input(s): PROCALCITON, LATICACIDVEN in the last 168 hours.  No results found for this or any previous visit (from the past 240 hour(s)).       Radiology Studies: No results found.      Scheduled Meds:  (feeding supplement) PROSource Plus  30 mL Oral BID BM   vitamin C  1,000 mg Oral Daily   cephALEXin  250 mg Oral QHS   darbepoetin (ARANESP) injection - NON-DIALYSIS  200 mcg Subcutaneous Q Mon-1800   docusate sodium  100 mg Oral Daily   feeding supplement (GLUCERNA SHAKE)  237 mL Oral Q24H   FLUoxetine  60 mg Oral QPM   furosemide  80 mg Intravenous Q12H   influenza vac split  quadrivalent PF  0.5 mL Intramuscular Tomorrow-1000   insulin aspart  0-20 Units Subcutaneous TID WC   insulin aspart  0-5 Units Subcutaneous QHS   insulin aspart  3 Units Subcutaneous TID WC   insulin glargine-yfgn  55 Units Subcutaneous QHS   methocarbamol  1,000 mg Oral BID   multivitamin with minerals  1 tablet Oral Daily   nutrition supplement (JUVEN)  1 packet  Oral BID BM   oxyCODONE  10 mg Oral Daily   pantoprazole  40 mg Oral Daily   pioglitazone  45 mg Oral Daily   polyethylene glycol  17 g Oral BID   Ensure Max Protein  11 oz Oral Daily   senna  1 tablet Oral Daily   Warfarin - Pharmacist Dosing Inpatient   Does not apply q1600   Continuous Infusions:  sodium chloride 10 mL/hr at 07/13/21 1107   sodium chloride     sodium chloride     ferric gluconate (FERRLECIT) IVPB Stopped (07/23/21 1144)     LOS: 42 days    Time spent: 30 minutes    Barb Merino, MD [facility] Pager [phone]

## 2021-07-24 NOTE — Progress Notes (Signed)
PT Cancellation Note  Patient Details Name: Joseph [name]. MRN: [id number] DOB: 07-30-1956   Cancelled Treatment:    Reason Eval/Treat Not Completed: Patient at procedure or test/unavailable Patient off unit at HD. Will re-attempt at later date.   Vicki Pasqual A. Gilford Rile PT, DPT Acute Rehabilitation Services Pager [phone] Office [phone]    Linna Hoff 07/24/2021, 2:57 PM

## 2021-07-24 NOTE — Progress Notes (Signed)
Patient planned for tunneled HD catheter placement today in IR, upon arrival to IR patient was weighed prior to placement on IR table as there is a strict weight limit of 500 lbs. Unfortunately with hardware and fluid overload patient now weighs 530 lbs, we are unable to proceed with tunneled HD catheter placement for this reason.  Discussed temporary HD catheter placement with plans to transition to tunneled HD catheter once hardware is removed/fluid status improves with nephrology today who is agreeable. Discussed with patient as well and he is also agreeable.  Risks and benefits of temporary HD catheter placement discussed with the patient including, but not limited to bleeding, infection, vascular injury, pneumothorax which may require chest tube placement, air embolism or even death.  All of the patient's questions were answered, patient is agreeable to proceed.  Consent signed and in chart.  Please call IR when patient weight < 500 lbs so we may proceed with tunneled HD catheter placement.  Candiss Norse, PA-C

## 2021-07-24 NOTE — Progress Notes (Signed)
Subjective:  Have been attempting diuresis with albumin and lasix, lasix only last 24 hours-  UOP only 150 yesterday.   Hemodynamically stable-  BUN and crt worsening still.  IR plans for [facility] today.   He reports fatigue but no other complaints this AM.  Wife not present this AM.   Objective Vital signs in last 24 hours: Vitals:   07/23/21 1953 07/23/21 2325 07/24/21 0443 07/24/21 0736  BP: (!) 146/45 (!) 139/42 (!) 146/39 (!) 158/58  Pulse: 72 73 73 71  Resp: 13 19 15 18   Temp: (!) 97.5 F (36.4 C) 97.6 F (36.4 C) 97.7 F (36.5 C) 97.9 F (36.6 C)  TempSrc: Oral Oral Oral Oral  SpO2: 100% 98% 95% 96%  Weight:      Height:       Weight change:   Intake/Output Summary (Last 24 hours) at 07/24/2021 0849 Last data filed at 07/23/2021 1557 Gross per 24 hour  Intake 280.38 ml  Output 150 ml  Net 130.38 ml     Assessment/Plan: 65 year old WM with many medical issues admitted after a fall req surgical management of LLE-  hosp complicated by extremely limited mobility-  has developed some A on CRF with BUN out of proprortion to crt-  refractory so it seems edema  1.Renal- some A on CRF esp with high BUN.    I cannot determine an etiology for his changed renal function.  u/s and another U/A not giving any clues-  seeming like pre renal insult, ? Cardiorenal?   all nephrotoxic meds have been discontinued.   trying and maximize renal perfusion so limiting what we can in terms of pain meds and muscle relaxers-  have stopped his prn hydralazine-  u/s does not show obstruction - have stopped his flomax.  Performing dialysis in this gentleman would be very problematic due to his size and his limited mobility and it is doubtful that he would get to a point where he would be appropriate for OP HD logistically -  Dr. Moshe Cipro had discussion with pt and wife they wish to proceed with HD at least in the short term and plans for IR HD catheter + HD today-  at baseline PTA pt could ambulate in the  house-  would go to the MD once a month-  riding in car.  I am still pessimistic about his suitability for OP HD.   2. Hypertension/volume  - very volume overloaded-  likely third spacing but also is overloaded-  have been using an albumin/lasix combo to attempt some diuresis mostly so that he can be liberated from the CPAP.  Amazing that his echo earlier in the month did not reveal any right sided cardiac issues -    achieving a negative balance but not enough-  need to start HD 3. Anemia  - treating with ESA and also IV iron 4. 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, being managed by pharmacy particularly periprocedureally  [place]: Basic Metabolic Panel: Recent Labs  Lab 07/22/21 0419 07/23/21 0444 07/24/21 0019  NA 135 135 134*  K 4.4 4.6 4.9  CL 102 103 102  CO2 24 24 21*  GLUCOSE 123* 151* 152*  BUN 128* 139* 145*  CREATININE 1.88* 2.29* 2.69*  CALCIUM 9.0 8.9 9.0  PHOS 4.3 5.2* 5.2*    Liver Function Tests: Recent Labs  Lab 07/18/21 2128 07/21/21 0259 07/22/21 0419 07/23/21 0444 07/24/21 0019  AST 16  --   --   --   --  ALT 7  --   --   --   --   ALKPHOS 86  --   --   --   --   BILITOT 1.0  --   --   --   --   PROT 5.9*  --   --   --   --   ALBUMIN 2.3*   < > 2.8* 2.6* 2.8*   < > = values in this interval not displayed.    No results for input(s): LIPASE, AMYLASE in the last 168 hours. No results for input(s): AMMONIA in the last 168 hours. CBC: Recent Labs  Lab 07/19/21 0148 07/20/21 0420 07/21/21 0259 07/22/21 0419 07/23/21 0444  WBC 7.9 7.2 6.1 6.1 7.0  HGB 7.8* 7.7* 7.0* 7.2* 7.2*  HCT 26.9* 26.5* 23.9* 24.8* 25.2*  MCV 95.4 94.0 92.6 92.5 94.0  PLT 441* 450* 463* 470* 471*    Cardiac Enzymes: Recent Labs  Lab 07/17/21 1635  CKTOTAL 16*    CBG: Recent Labs  Lab 07/23/21 0814 07/23/21 1135 07/23/21 1552 07/23/21 2142 07/24/21 0736  GLUCAP 157* 149* 130* 146* 155*     Iron Studies:  No results for input(s): IRON, TIBC,  TRANSFERRIN, FERRITIN in the last 72 hours.  Studies/Results: No results found. Medications: Infusions:  sodium chloride 10 mL/hr at 07/13/21 1107   sodium chloride     sodium chloride     ferric gluconate (FERRLECIT) IVPB Stopped (07/23/21 1144)    Scheduled Medications:  (feeding supplement) PROSource Plus  30 mL Oral BID BM   vitamin C  1,000 mg Oral Daily   cephALEXin  250 mg Oral QHS   darbepoetin (ARANESP) injection - NON-DIALYSIS  200 mcg Subcutaneous Q Mon-1800   docusate sodium  100 mg Oral Daily   feeding supplement (GLUCERNA SHAKE)  237 mL Oral Q24H   FLUoxetine  60 mg Oral QPM   furosemide  80 mg Intravenous Q12H   influenza vac split quadrivalent PF  0.5 mL Intramuscular Tomorrow-1000   insulin aspart  0-20 Units Subcutaneous TID WC   insulin aspart  0-5 Units Subcutaneous QHS   insulin aspart  3 Units Subcutaneous TID WC   insulin glargine-yfgn  55 Units Subcutaneous QHS   methocarbamol  1,000 mg Oral BID   multivitamin with minerals  1 tablet Oral Daily   nutrition supplement (JUVEN)  1 packet Oral BID BM   oxyCODONE  10 mg Oral Daily   pantoprazole  40 mg Oral Daily   pioglitazone  45 mg Oral Daily   polyethylene glycol  17 g Oral BID   Ensure Max Protein  11 oz Oral Daily   senna  1 tablet Oral Daily   Warfarin - Pharmacist Dosing Inpatient   Does not apply q1600    have reviewed scheduled and prn medications.  Physical Exam: General:  morbidly obese- Heart: RRR Lungs: CBS bilat on nasal pillow cpap Abdomen: obese-  abd wall edema  Extremities: pitting edema-  external fixator on left,     07/24/2021,8:49 AM  LOS: 42 days

## 2021-07-24 NOTE — Progress Notes (Signed)
ANTICOAGULATION CONSULT NOTE - Follow Up Consult  Pharmacy Consult for Warfarin Indication: h/o pulmonary embolus  Allergies  Allergen Reactions   Tape     Peels skin on legs   Chlorhexidine Rash    Wipes    Patient Measurements: Height: 6\' 1"  (185.4 cm) Weight: (!) 218 kg (480 lb 9.6 oz) IBW/kg (Calculated) : 79.9  Vital Signs: Temp: 97.9 F (36.6 C) (10/28 0736) Temp Source: Oral (10/28 0736) BP: 158/58 (10/28 0736) Pulse Rate: 71 (10/28 0736)  Labs: Recent Labs    07/22/21 0419 07/23/21 0444 07/24/21 0019  HGB 7.2* 7.2*  --   HCT 24.8* 25.2*  --   PLT 470* 471*  --   LABPROT 17.8* 19.3* 18.2*  INR 1.5* 1.6* 1.5*  CREATININE 1.88* 2.29* 2.69*     Estimated Creatinine Clearance: 53 mL/min (A) (by C-G formula based on SCr of 2.69 mg/dL (H)).   Medications:  PTA Warfarin: 5 mg on Sun/Mon/Wed/Fri evening, and 7.5 mg on Tue/Thur/Sat evening.   INR was supratherapeutic (6.2) at admission  Assessment: 37 YOM on lifelong warfarin therapy for hx of PE, body habitus, and sedentary lifestyle admitted on 9/15 with left knee dislocation and effusion concerning for hematoma. At admission, warfarin was held, vitamin K was given for supratherapeutic INR (6.2), and pt was transfused.   Warfarin on hold for [facility] today. INR today remains SUBtherapeutic (INR down to 1.5, goal of 2-3). Hgb 7.2 - low but stable.   Goal of Therapy:  INR 2-3 Monitor platelets by anticoagulation protocol: Yes   Plan:  - Continue holding Warfarin - For [employer] today. F/u plan to resume warfarin post procedure  - Will continue to monitor for any signs/symptoms of bleeding and will follow up with PT/INR in the a.m.    Thank you for allowing pharmacy to be a part of this patient's care.  Albertina Parr, PharmD., BCPS, BCCCP Clinical Pharmacist Please refer to [facility] for unit-specific pharmacist   **Pharmacist phone directory can now be found on [email or web address] ([email or web address]).  Listed under [place].

## 2021-07-24 NOTE — TOC Progression Note (Addendum)
Transition of Care (TOC) - Progression Note    Patient Details  Name: Joseph C Baswell Jr. MRN: [id number] Date of Birth: 03/16/1956  Transition of Care (TOC) CM/SW Contact  Michelle R Stubbldfield, RN Phone Number: 07/24/2021, 11:28 AM  Clinical Narrative:    CM met with the patient's wife and son at the bedside.  The patient was transported to IR for procedure at this time for likely HD this afternoon.  The patient is no medically stable at this time considering patient's medical needs.  I discussed with the wife options for rehabilitation were based on patient's physical needs and mobility at a later date, and once the patient is medically stable to discharge, SNF placement may certainly involve placement outside the immediate area considering patient's medical needs and bariatric equipment requirements.    The patient's son, present in the room, states that he lives in [place], [place] at this time with his family.  I discussed with the wife that I would continue to explore SNF options for the patient appropriate for his complex medical needs.  CM gave the patient's wife resource information for [facility] to assist with home improvements if the patient is able to discharge home at a later date.  The wife will reach out to the agency if appropriate to assist.  In the meantime, the wife states that the family is on the [facility] list with the [employer] to assist with home ramp repairs and renovations.  Once the patient is medically stable - CM and MSW plans to follow up with the following possible SNf options including:  [facility] facility - ?[place] ([place] facility is unable to offer bed due to BMI)  CM and MSW with DTP Team will continue to follow the patient for SNF placement.   Expected Discharge Plan: Skilled Nursing Facility Barriers to Discharge: Continued Medical Work up (Plan for return to OR with Dr. Duda on 10/14- I&D)  Expected  Discharge Plan and Services Expected Discharge Plan: Skilled Nursing Facility In-house Referral: Clinical Social Work Discharge Planning Services: CM Consult Post Acute Care Choice: Skilled Nursing Facility Living arrangements for the past 2 months: Single Family Home                                       Social Determinants of Health (SDOH) Interventions    Readmission Risk Interventions Readmission Risk Prevention Plan 07/15/2021 07/09/2021  Transportation Screening Complete Complete  Medication Review (RN Care Manager) Complete Complete  PCP or Specialist appointment within 3-5 days of discharge Complete Complete  HRI or Home Care Consult Complete Complete  SW Recovery Care/Counseling Consult Complete Complete  Palliative Care Screening Complete -  Skilled Nursing Facility Complete Complete  Some recent data might be hidden    

## 2021-07-25 DIAGNOSIS — S83105A Unspecified dislocation of left knee, initial encounter: Secondary | ICD-10-CM | POA: Diagnosis not present

## 2021-07-25 DIAGNOSIS — Z515 Encounter for palliative care: Secondary | ICD-10-CM

## 2021-07-25 DIAGNOSIS — N179 Acute kidney failure, unspecified: Secondary | ICD-10-CM | POA: Diagnosis not present

## 2021-07-25 DIAGNOSIS — Z7189 Other specified counseling: Secondary | ICD-10-CM

## 2021-07-25 DIAGNOSIS — I700 Atherosclerosis of aorta: Secondary | ICD-10-CM

## 2021-07-25 LAB — CBC
HCT: 24.9 % — ABNORMAL LOW (ref 39.0–52.0)
Hemoglobin: 7.3 g/dL — ABNORMAL LOW (ref 13.0–17.0)
MCH: 27.5 pg (ref 26.0–34.0)
MCHC: 29.3 g/dL — ABNORMAL LOW (ref 30.0–36.0)
MCV: 94 fL (ref 80.0–100.0)
Platelets: 498 10*3/uL — ABNORMAL HIGH (ref 150–400)
RBC: 2.65 MIL/uL — ABNORMAL LOW (ref 4.22–5.81)
RDW: 17.2 % — ABNORMAL HIGH (ref 11.5–15.5)
WBC: 7 10*3/uL (ref 4.0–10.5)
nRBC: 0.4 % — ABNORMAL HIGH (ref 0.0–0.2)

## 2021-07-25 LAB — RENAL FUNCTION PANEL
Albumin: 2.5 g/dL — ABNORMAL LOW (ref 3.5–5.0)
Albumin: 2.6 g/dL — ABNORMAL LOW (ref 3.5–5.0)
Anion gap: 11 (ref 5–15)
Anion gap: 10 (ref 5–15)
BUN: 118 mg/dL — ABNORMAL HIGH (ref 8–23)
BUN: 120 mg/dL — ABNORMAL HIGH (ref 8–23)
CO2: 20 mmol/L — ABNORMAL LOW (ref 22–32)
CO2: 24 mmol/L (ref 22–32)
Calcium: 8.3 mg/dL — ABNORMAL LOW (ref 8.9–10.3)
Calcium: 8.6 mg/dL — ABNORMAL LOW (ref 8.9–10.3)
Chloride: 103 mmol/L (ref 98–111)
Chloride: 101 mmol/L (ref 98–111)
Creatinine, Ser: 2.84 mg/dL — ABNORMAL HIGH (ref 0.61–1.24)
Creatinine, Ser: 2.92 mg/dL — ABNORMAL HIGH (ref 0.61–1.24)
GFR, Estimated: 24 mL/min — ABNORMAL LOW (ref >60)
GFR, Estimated: 23 mL/min — ABNORMAL LOW (ref >60)
Glucose, Bld: 109 mg/dL — ABNORMAL HIGH (ref 70–99)
Glucose, Bld: 118 mg/dL — ABNORMAL HIGH (ref 70–99)
Phosphorus: 4.4 mg/dL (ref 2.5–4.6)
Phosphorus: 4.6 mg/dL (ref 2.5–4.6)
Potassium: 4.9 mmol/L (ref 3.5–5.1)
Potassium: 4.2 mmol/L (ref 3.5–5.1)
Sodium: 134 mmol/L — ABNORMAL LOW (ref 135–145)
Sodium: 135 mmol/L (ref 135–145)

## 2021-07-25 LAB — HEPARIN LEVEL (UNFRACTIONATED): Heparin Unfractionated: <0.1 IU/mL — ABNORMAL LOW (ref 0.30–0.70)

## 2021-07-25 LAB — GLUCOSE, CAPILLARY
Glucose-Capillary: 113 mg/dL — ABNORMAL HIGH (ref 70–99)
Glucose-Capillary: 149 mg/dL — ABNORMAL HIGH (ref 70–99)
Glucose-Capillary: 139 mg/dL — ABNORMAL HIGH (ref 70–99)

## 2021-07-25 LAB — PROTIME-INR
INR: 2 — ABNORMAL HIGH (ref 0.8–1.2)
Prothrombin Time: 22.8 seconds — ABNORMAL HIGH (ref 11.4–15.2)

## 2021-07-25 MED ORDER — HEPARIN (PORCINE) 25000 UT/250ML-% IV SOLN
2900.0000 [IU]/h | INTRAVENOUS | Status: DC
  Administered 2021-07-25: 2900 [IU]/h via INTRAVENOUS
  Administered 2021-07-25: 2500 [IU]/h via INTRAVENOUS
  Administered 2021-07-26 – 2021-07-27 (×4): 2900 [IU]/h via INTRAVENOUS
  Filled 2021-07-25 (×6): qty 250

## 2021-07-25 MED ORDER — HEPARIN SODIUM (PORCINE) 1000 UNIT/ML IJ SOLN
2400.0000 [IU] | Freq: Once | INTRAMUSCULAR | Status: AC
  Administered 2021-07-25: 2400 [IU] via INTRAVENOUS
  Filled 2021-07-25: qty 3

## 2021-07-25 NOTE — Progress Notes (Signed)
RT called to dialysis for low spo2. Started O2 at 6lpm spo2 is now 95%. Pt has a large sore on bridge of nose caused by CPAP mask.

## 2021-07-25 NOTE — Progress Notes (Signed)
PROGRESS NOTE    Joseph [name].  KXF:[id number] DOB: Oct 05, 1955 DOA: 06/11/2021 PCP: Donald Prose, MD    Brief Narrative:  65 year old gentleman with history of super morbid obesity, history of sleep apnea on CPAP, type 2 diabetes, hypertension, hyperlipidemia, peripheral artery disease, DVT on Coumadin, GERD, anxiety and depression and multiple falls presented to the emergency room on 9/15 after a fall at home resulting in left knee pain.  He was found to have left knee dislocation and fracture fragments with left knee effusion.  Underwent close reduction in the ER and admitted to the hospital.  9/15, admitted with close reduction but continues pain. 9/30, MRI with significant ligamentous injury and lateral patellar dislocation.  Left knee extensive debridement, reduction of dislocation, external fixation with wound VAC application for chronic left thigh wound. On CPAP for sleep apnea Also received 2 units of PRBC and 1 unit of FFP for supratherapeutic INR during hospital course. 10/22, noted shortness of breath on talking.  Hospitalization prolonged with severe physical deconditioning and remains with very poor respiratory status.  Nephrology consulted. 10/28, decided to start on hemodialysis due to persistent fluid retention and decreased urine output. Started on hemodialysis with temporary dialysis catheter.   Assessment & Plan:   Principal Problem:   Left knee dislocation, initial encounter Active Problems:   Pure hypercholesterolemia   Essential hypertension, benign   OSA (obstructive sleep apnea)   Hx pulmonary embolism   Esophageal reflux   Diabetes mellitus type 2, uncontrolled   Diabetic neuropathy (HCC)   Acute renal failure superimposed on stage 3b chronic kidney disease (HCC)   Normocytic anemia   Class 3 obesity (HCC)   Aortic atherosclerosis (HCC)   Coronary atherosclerosis   Effusion of left knee   Left knee dislocation   Pressure injury of skin   Unstageable  pressure ulcer of sacral region ([place])   At risk for hemorrhage associated with anticoagulation therapy   Ulcer of left knee, with necrosis of bone ([place])   Open knee wound, left, subsequent encounter   Moderate episode of recurrent major depressive disorder ([place])  Fall, left knee hematoma and dislocation: Surgical reduction with external fixation, hematoma evacuation and skin grafting by Dr. Sharol Given. Skin graft failed, now has open wound and receiving daily dressing.  Followed by surgery. Mobility has been challenging. Weightbearing as tolerated if he can. Adequate pain medications. Currently on oxycodone IR 10 mg daily, 5 mg every 4 hours as needed and IV Dilaudid.  Also on Robaxin.  Still using IV pain medications for intermittent pain control. Difficulty healing his left thigh wound due to immobility and multiple medical factors.  Anasarca and pulmonary edema and acute renal failure: Likely secondary to severe hypoalbuminemia and fluid overload.  Intermittently treated with IV Lasix with no significant results. Significant leg edema and scrotal edema.  No significant urine output. Patient could not feed on the radiology table to insert a permacath, a temporary dialysis catheter was placed and started on hemodialysis 10/28, 10/29.  Acute on chronic hypoxemic and hypercarbic respiratory failure, obstructive sleep apnea: Using CPAP at night.  Use CPAP most of the time with breaks for comfort. Intermittently gets shortness of breath and unable to maintain on CPAP, uses BiPAP.  Acute metabolic encephalopathy: Resolved.  Ativan discontinued and currently on Prozac 60 mg.  Delirium precautions.  Type 2 diabetes: Controlled.  On Lantus and Premeal insulin.  Also on Actos.  Essential hypertension: Blood pressure is stable.  History of DVT/PE: Coumadin on hold  for procedure.  Has a history of DVT and PE and very high risk of developing another thromboembolic event due to morbid obesity and bedbound  status.  Will bridge with heparin until patient gets a permacath.  Anxiety disorder: On Prozac every morning and hydroxyzine as needed.  Ativan discontinued.  Severe morbid obesity: BMI 64.  Anemia of chronic disease: Currently getting iron transfusions.  Hemoglobin 7-7.2.  Blood transfusion will put him on fluid overload.  Receiving iron transfusions.  Hemoglobin 7.3.  If further drops, will benefit with transfusion along with hemodialysis.  Physical deconditioning anxiety and lethargy limiting his ability to participate in PT. Poor recovery due to severe morbid obesity, nonhealing wounds, pain and unable to come off the CPAP around-the-clock.   DVT prophylaxis:   Therapeutic on Coumadin.  On hold for procedure.  Start heparin drip.   Code Status: Full code. Family Communication: None. Disposition Plan: Status is: Inpatient  Remains inpatient appropriate because: Around-the-clock CPAP. unsafe discharge because of active pain management, IV pain medications.  Renal function recovery.       Consultants:  Orthopedics Pulmonary critical care Nephrology  Procedures:  Multiple procedures applicable  Antimicrobials:  Multiple antibiotics, currently on Keflex   Subjective:  Patient seen and examined.  Today he is getting hemodialysis.  Had some problem with feeding CPAP while on hemodialysis but otherwise he feels okay.  He tells me he is feeling okay with dialysis.  Objective: Vitals:   07/25/21 0900 07/25/21 0930 07/25/21 1000 07/25/21 1030  BP: (!) 154/45 (!) 158/59 (!) 155/50 (!) 148/51  Pulse: 69 70 74 70  Resp: 19 17 14 17   Temp:      TempSrc:      SpO2: 93% 94% 97% 99%  Weight:      Height:        Intake/Output Summary (Last 24 hours) at 07/25/2021 1115 Last data filed at 07/25/2021 0323 Gross per 24 hour  Intake --  Output 1550 ml  Net -1550 ml   Filed Weights   07/08/21 0958 07/24/21 1715 07/25/21 0717  Weight: (!) 218 kg (!) 216.5 kg 107.7 kg     Examination:  General exam: Morbidly obese gentleman laying in bed on CPAP.  Currently getting hemodialysis. Respiratory system: Poor bilateral air entry but no added sounds.  Difficult to auscultate. Cardiovascular system: S1 & S2 heard, RRR.  Gastrointestinal system: Soft.  Obese and pendulous.  Bowel sounds present.  Nontender.   Central nervous system: Alert and oriented. No focal neurological deficits.  Generalized weakness. Extremities:  Right lower extremity with chronic venous stasis changes and pigmentation, 2+ edema. Left lower extremity with big open wound anterior thigh with serosanguineous discharge, external fixator in place.   He does have scrotal edema. Tense edema of the dorsum of the feet.    Data Reviewed: I have personally reviewed following labs and imaging studies  CBC: Recent Labs  Lab 07/20/21 0420 07/21/21 0259 07/22/21 0419 07/23/21 0444 07/25/21 0546  WBC 7.2 6.1 6.1 7.0 7.0  HGB 7.7* 7.0* 7.2* 7.2* 7.3*  HCT 26.5* 23.9* 24.8* 25.2* 24.9*  MCV 94.0 92.6 92.5 94.0 94.0  PLT 450* 463* 470* 471* 403*   Basic Metabolic Panel: Recent Labs  Lab 07/18/21 2128 07/20/21 0946 07/22/21 0419 07/23/21 0444 07/24/21 0019 07/25/21 0454 07/25/21 0546  NA 134*   < > 135 135 134* 134* 135  K 5.4*   < > 4.4 4.6 4.9 4.9 4.2  CL 104   < > 102  103 102 103 101  CO2 20*   < > 24 24 21* 20* 24  GLUCOSE 212*   < > 123* 151* 152* 109* 118*  BUN 99*   < > 128* 139* 145* 118* 120*  CREATININE 1.58*   < > 1.88* 2.29* 2.69* 2.84* 2.92*  CALCIUM 8.8*   < > 9.0 8.9 9.0 8.3* 8.6*  MG 2.2  --   --   --   --   --   --   PHOS  --    < > 4.3 5.2* 5.2* 4.4 4.6   < > = values in this interval not displayed.   GFR: Estimated Creatinine Clearance: 32.9 mL/min (A) (by C-G formula based on SCr of 2.92 mg/dL (H)). Liver Function Tests: Recent Labs  Lab 07/18/21 2128 07/21/21 0259 07/22/21 0419 07/23/21 0444 07/24/21 0019 07/25/21 0454 07/25/21 0546  AST 16  --    --   --   --   --   --   ALT 7  --   --   --   --   --   --   ALKPHOS 86  --   --   --   --   --   --   BILITOT 1.0  --   --   --   --   --   --   PROT 5.9*  --   --   --   --   --   --   ALBUMIN 2.3*   < > 2.8* 2.6* 2.8* 2.5* 2.6*   < > = values in this interval not displayed.   No results for input(s): LIPASE, AMYLASE in the last 168 hours. No results for input(s): AMMONIA in the last 168 hours. Coagulation Profile: Recent Labs  Lab 07/21/21 0259 07/22/21 0419 07/23/21 0444 07/24/21 0019 07/25/21 0546  INR 1.7* 1.5* 1.6* 1.5* 2.0*   Cardiac Enzymes: No results for input(s): CKTOTAL, CKMB, CKMBINDEX, TROPONINI in the last 168 hours.  BNP (last 3 results) No results for input(s): PROBNP in the last 8760 hours. HbA1C: No results for input(s): HGBA1C in the last 72 hours. CBG: Recent Labs  Lab 07/23/21 2142 07/24/21 0736 07/24/21 1317 07/24/21 1803 07/24/21 2106  GLUCAP 146* 155* 141* 118* 109*   Lipid Profile: No results for input(s): CHOL, HDL, LDLCALC, TRIG, CHOLHDL, LDLDIRECT in the last 72 hours. Thyroid Function Tests: No results for input(s): TSH, T4TOTAL, FREET4, T3FREE, THYROIDAB in the last 72 hours. Anemia Panel: No results for input(s): VITAMINB12, FOLATE, FERRITIN, TIBC, IRON, RETICCTPCT in the last 72 hours.  Sepsis Labs: No results for input(s): PROCALCITON, LATICACIDVEN in the last 168 hours.  No results found for this or any previous visit (from the past 240 hour(s)).       Radiology Studies: IR US Guide Vasc Access Right  Result Date: 07/24/2021 INDICATION: 65 year old gentleman with renal failure requires dialysis access. Due to the patient's weight, procedure could not be performed on the angio table. Procedure performed at bedside with ultrasound guidance. The position of the catheter was confirmed on chest radiograph. EXAM: Ultrasound-guided right IJ temporary hemodialysis catheter placement. MEDICATIONS: None ANESTHESIA/SEDATION: None  FLUOROSCOPY TIME:  None COMPLICATIONS: None immediate. PROCEDURE: Informed written consent was obtained from the patient after a thorough discussion of the procedural risks, benefits and alternatives. All questions were addressed. Maximal Sterile Barrier Technique was utilized including caps, mask, sterile gowns, sterile gloves, sterile drape, hand hygiene and skin antiseptic. A timeout was performed prior to  the initiation of the procedure. Right neck prepped and draped in usual fashion. Ultrasound image documenting patency of the right internal jugular vein was obtained and placed in permanent medical record. Sterile ultrasound probe cover and sterile gel utilized throughout the procedure. Using continuous ultrasound guidance, the right internal jugular vein was accessed with a 21 gauge needle. 21 gauge needle exchanged for transitional dilator set over 0.018 inch guidewire. 0.035 inch guidewire advanced through the 5 [place] dilator without difficulty. Serial dilation was performed over the 0.035 inch guidewire and 15 cm dialysis catheter was inserted. All lumens aspirated and flushed well. The dialysis lumens were locked with heparin. The catheter was secured to skin with suture at and the insert site was covered with bio patch and sterile dressing. Post placement radiograph showed the catheter tip in the superior vena cava. IMPRESSION: 1. 15 cm right IJ temporary hemodialysis catheter is ready for use. 2. Tunneled hemodialysis catheter was not placed as the patient's weight was above the threshold of the angiography table. Electronically Signed   By: Miachel Roux M.D.   On: 07/24/2021 14:42   DG Chest Port 1 View  Result Date: 07/24/2021 CLINICAL DATA:  Temporary hemodialysis catheter placement EXAM: PORTABLE CHEST 1 VIEW COMPARISON:  07/20/2021 FINDINGS: The heart size within normal limits. Mild interval worsening of pulmonary vascular congestion. No pneumothorax. Bibasilar patchy opacity likely result of  atelectasis. Newly placed right IJ dialysis catheter terminates in the region of the superior vena cava. IMPRESSION: 1. Newly placed right IJ temporary hemodialysis catheter terminates in the region of the superior vena cava. 2. Mild interval worsening of pulmonary vascular congestion and bibasilar atelectasis. Electronically Signed   By: Miachel Roux M.D.   On: 07/24/2021 14:43        Scheduled Meds:  (feeding supplement) PROSource Plus  30 mL Oral BID BM   vitamin C  1,000 mg Oral Daily   cephALEXin  250 mg Oral QHS   darbepoetin (ARANESP) injection - NON-DIALYSIS  200 mcg Subcutaneous Q Mon-1800   docusate sodium  100 mg Oral Daily   feeding supplement (GLUCERNA SHAKE)  237 mL Oral Q24H   FLUoxetine  60 mg Oral QPM   furosemide  80 mg Intravenous Q12H   influenza vac split quadrivalent PF  0.5 mL Intramuscular Tomorrow-1000   insulin aspart  0-20 Units Subcutaneous TID WC   insulin aspart  0-5 Units Subcutaneous QHS   insulin aspart  3 Units Subcutaneous TID WC   insulin glargine-yfgn  55 Units Subcutaneous QHS   methocarbamol  1,000 mg Oral BID   multivitamin with minerals  1 tablet Oral Daily   nutrition supplement (JUVEN)  1 packet Oral BID BM   oxyCODONE  10 mg Oral Daily   pantoprazole  40 mg Oral Daily   pioglitazone  45 mg Oral Daily   polyethylene glycol  17 g Oral BID   Ensure Max Protein  11 oz Oral Daily   senna  1 tablet Oral Daily   Warfarin - Pharmacist Dosing Inpatient   Does not apply q1600   Continuous Infusions:  sodium chloride 10 mL/hr at 07/13/21 1107   heparin       LOS: 43 days    Time spent: 30 minutes    Barb Merino, MD [facility] Pager [phone]

## 2021-07-25 NOTE — Progress Notes (Signed)
Today I did wound care on the patients left leg and there is a foul odor and site did not look any better. Family wanted to see if there was wound care specialist available. I paged attending doctor and he said he will make Dr. Sharol Given aware of the unpleasant appearance of the wound.

## 2021-07-25 NOTE — Progress Notes (Signed)
ANTICOAGULATION CONSULT NOTE - Follow Up Consult  Pharmacy Consult for Warfarin > heparin  Indication: h/o pulmonary embolus  Allergies  Allergen Reactions   Tape     Peels skin on legs   Chlorhexidine Rash    Wipes    Patient Measurements: Height: 6\' 1"  (185.4 cm) Weight: 107.7 kg (237 lb 6.4 oz) IBW/kg (Calculated) : 79.9 Heparin dosing wt: 98kg  Vital Signs: Temp: 97.7 F (36.5 C) (10/29 1456) Temp Source: Axillary (10/29 1456) BP: 133/35 (10/29 1456) Pulse Rate: 76 (10/29 1456)  Labs: Recent Labs    07/23/21 0444 07/24/21 0019 07/25/21 0454 07/25/21 0546 07/25/21 1658  HGB 7.2*  --   --  7.3*  --   HCT 25.2*  --   --  24.9*  --   PLT 471*  --   --  498*  --   LABPROT 19.3* 18.2*  --  22.8*  --   INR 1.6* 1.5*  --  2.0*  --   HEPARINUNFRC  --   --   --   --  <0.10*  CREATININE 2.29* 2.69* 2.84* 2.92*  --      Estimated Creatinine Clearance: 32.9 mL/min (A) (by C-G formula based on SCr of 2.92 mg/dL (H)).   Medications:  PTA Warfarin: 5 mg on Sun/Mon/Wed/Fri evening, and 7.5 mg on Tue/Thur/Sat evening.   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 At admission, warfarin was held, vitamin K was given for supratherapeutic INR (6.2), and pt was transfused.   Patient was on Warfarin which was held on 10/27 with plans for [facility] placement in IR on 10/28. IR was unable to place [facility] since patient weight exceeded IR table weight limit. Pharmacy now consulted to start IV heparin bridge and hold warfarin until temporary HD catheter can be placed.   Heparin level < 0.1 is subtherapeutic on 2500units/hr. Per chart, patient was previously on 2900 units/hr with HL at low end of goal though creatinine has worsened since then. INR is 2, will not bolus at this time.    Goal of Therapy:  Heparin  level 0.3-0.7 units/ml Monitor platelets by anticoagulation protocol: Yes   Plan:  Continue holding Warfarin Increase heparin to 2900 units/hr  Monitor daily INR, HL, CBC/plt Monitor for signs/symptoms of bleeding     Thank you for allowing pharmacy to be a part of this patient's care.  Benetta Spar, PharmD, BCPS, BCCP Clinical Pharmacist  Please check AMION for all [place] phone numbers After 10:00 PM, call [place] [phone]

## 2021-07-25 NOTE — Consult Note (Signed)
Palliative Care Consult Note                                  Date: 07/25/2021   Patient Name: Joseph [name].  DOB: [birth year]  MRN: [id number]  Age / Sex: 65 y.o., male  PCP: Donald Prose, MD Referring Physician: Barb Merino, MD  Reason for Consultation: Establishing goals of care, Non pain symptom management, and Pain control  HPI/Patient Profile: Palliative Care consult requested on hospital day 42 for symptom management and goals of care discussion in this 65 y.o. male  with past medical history of morbid obesity, falls, sleep apnea (CPAP), type 2 diabetes, hyperlipidemia, PAD, hypertension, DVT (Coumadin), anxiety, depression, and GERD.  He was admitted on 06/11/2021 from home with s/p fall resulting in knee dislocation.  Hospital course complicated by closed reduction of left knee dislocation, significant ligamentous injury and lateral patellar dislocation s/p extensive debridement, reduction of dislocation, external fixation with wound VAC to chronic left thigh wound, respiratory failure, acute on chronic kidney failure requiring short-term dialysis.  Past Medical History:  Diagnosis Date   Anxiety state, unspecified    Aortic atherosclerosis ([place]) 06/12/2021   Depression    DVT (deep venous thrombosis) ([place]) 2004   Left knee   Edema    Esophageal reflux    occ   Essential hypertension, benign    Essential hypertension, benign    Falls frequently    History of iron deficiency anemia    Morbid obesity (HCC)    Myalgia and myositis, unspecified    BACK AND LEGS   OSA on CPAP    Peripheral vascular disease, unspecified (HCC)    Personal history of PE (pulmonary embolism) 2004   Polyneuropathy in diabetes(357.2)    Poor venous access    PT STATES DIFFICULT TO DRAW BLOOD FROM HIS VEINS   Pure hypercholesterolemia    Shortness of breath    DUE TO WEIGHT AND LOSS OF MOBILITY   Type II or unspecified type diabetes mellitus with  neurological manifestations, not stated as uncontrolled(250.60)    Type II or unspecified type diabetes mellitus without mention of complication, not stated as uncontrolled    Type II or unspecified type diabetes mellitus without mention of complication, uncontrolled    Varicose veins of lower extremities with inflammation    Wears glasses    Wound infection after surgery    required hospitalization     Subjective:   This NP Osborne Oman reviewed medical records, received report from team, assessed the patient and then met at the patient's bedside with patient, wife, and daughter Joseph [name] to discuss diagnosis, prognosis, GOC, EOL wishes disposition and options.  Mr. Radu is awake and alert in bed with CPAP in place.  He did remove mask once lunch tray arrived.  Observed eating a sandwich and soup without complications.   Concept of Palliative Care was introduced as specialized medical care for people and their families living with serious illness.  It focuses on providing relief from the symptoms and stress of a serious illness.  The goal is to improve quality of life for both the patient and the family. Values and goals of care important to patient and family were attempted to be elicited.  I created space and opportunity for patient and family to explore state of health prior to admission, thoughts, and feelings.  Family reports prior to admission patient has had several falls  due to the knees locking up.  Appetite good.  Patient was able to perform all ADLs independently.  We discussed His current illness and what it means in the larger context of His on-going co-morbidities. Natural disease trajectory and expectations were discussed.  Family verbalized understanding of current illness and co-morbidities.  They expressed concerns with his current illness and events leading up to current state.  Family is clear they are remaining hopeful for some stability with awareness that his condition is  quite frail.  We discussed patient's ability to turn and receive proper care to back start in addition to wound care.  Family verbalized understanding expressing patient's discomfort but willingness to allow for our treatment in order for him to improve.  Daughter shares patient tolerates procedures pending supportive care during such tasks.  Extensive discussion regarding patient's ongoing symptoms.  Patient complains of generalized pain in addition to more specific location of left knee/leg/thigh.  Prior to admission he was not on pain medication regularly.  Discussed at length patient's current regimen with scheduled Oxy in addition to oxycodone/Dilaudid for breakthrough pain.  Pain is reported to be managed and is more specific to movement/procedures with occasional episodes throughout the day.  I discussed the importance of continued conversation with family and their medical providers regarding overall plan of care and treatment options, ensuring decisions are within the context of the patients values and GOCs.  Questions and concerns were addressed. The family was encouraged to call with questions or concerns.  PMT will continue to support holistically as needed.  Life Review: Patient lives in the home with his wife of more than 40 years.  They have 2 children.  He is a retired Advertising copywriter.  Enjoys working on [employer] cars and small model cars and reconstructing them.   Objective:   Primary Diagnoses: Present on Admission:  Left knee dislocation, initial encounter  Essential hypertension, benign  OSA (obstructive sleep apnea)  Pure hypercholesterolemia  Diabetes mellitus type 2, uncontrolled  Diabetic neuropathy (HCC)  Hx pulmonary embolism  Esophageal reflux  Acute renal failure superimposed on stage 3b chronic kidney disease (HCC)  Normocytic anemia  Class 3 obesity (HCC)  Aortic atherosclerosis (HCC)  Coronary atherosclerosis  Left knee dislocation   Scheduled  Meds:  (feeding supplement) PROSource Plus  30 mL Oral BID BM   vitamin C  1,000 mg Oral Daily   cephALEXin  250 mg Oral QHS   darbepoetin (ARANESP) injection - NON-DIALYSIS  200 mcg Subcutaneous Q Mon-1800   docusate sodium  100 mg Oral Daily   feeding supplement (GLUCERNA SHAKE)  237 mL Oral Q24H   FLUoxetine  60 mg Oral QPM   furosemide  80 mg Intravenous Q12H   influenza vac split quadrivalent PF  0.5 mL Intramuscular Tomorrow-1000   insulin aspart  0-20 Units Subcutaneous TID WC   insulin aspart  0-5 Units Subcutaneous QHS   insulin aspart  3 Units Subcutaneous TID WC   insulin glargine-yfgn  55 Units Subcutaneous QHS   methocarbamol  1,000 mg Oral BID   multivitamin with minerals  1 tablet Oral Daily   nutrition supplement (JUVEN)  1 packet Oral BID BM   oxyCODONE  10 mg Oral Daily   pantoprazole  40 mg Oral Daily   pioglitazone  45 mg Oral Daily   polyethylene glycol  17 g Oral BID   Ensure Max Protein  11 oz Oral Daily   senna  1 tablet Oral Daily   Warfarin -  Pharmacist Dosing Inpatient   Does not apply q1600    Continuous Infusions:  sodium chloride 10 mL/hr at 07/13/21 1107   heparin 2,500 Units/hr (07/25/21 1217)    PRN Meds: acetaminophen **OR** acetaminophen, alum & mag hydroxide-simeth, bisacodyl, guaiFENesin-dextromethorphan, heparin lock flush, heparin sodium (porcine), HYDROmorphone (DILAUDID) injection, hydrOXYzine, labetalol, lidocaine, methocarbamol **OR** [DISCONTINUED] methocarbamol (ROBAXIN) IV, metoCLOPramide **OR** metoCLOPramide (REGLAN) injection, metoprolol tartrate, ondansetron, ondansetron **OR** [DISCONTINUED] ondansetron (ZOFRAN) IV, oxyCODONE **AND** oxyCODONE, phenol, polyethylene glycol, silver sulfADIAZINE, vitamin A & D  Allergies  Allergen Reactions   Tape     Peels skin on legs   Chlorhexidine Rash    Wipes    Review of Systems  Musculoskeletal:  Positive for arthralgias.  Neurological:  Positive for weakness.  Unless otherwise  noted, a complete review of systems is negative.  Physical Exam General: NAD, morbidly obese chronically-ill appearing Cardiovascular: regular rate and rhythm,  Pulmonary: CPAP in place, normal breathing pattern Abdomen: soft, nontender, + bowel sounds Extremities: Anasarca, bilateral lower extremity pitting edema Skin: no rashes, warm and dry, ulceration to nose bridge Neurological: AAOx3, mood appropriate  Vital Signs:  BP (!) 133/35 (BP Location: Left Arm)   Pulse 76   Temp 97.7 F (36.5 C) (Axillary)   Resp 20   Ht 6' 1"  (1.854 m)   Wt 107.7 kg   SpO2 92%   BMI 31.32 kg/m  Pain Scale: 0-10 POSS *See Group Information*: 1-Acceptable,Awake and alert Pain Score: 0-No pain  SpO2: SpO2: 92 % O2 Device:SpO2: 92 % O2 Flow Rate: .O2 Flow Rate (L/min): 15 L/min  IO: Intake/output summary:  Intake/Output Summary (Last 24 hours) at 07/25/2021 1641 Last data filed at 07/25/2021 1050 Gross per 24 hour  Intake --  Output 3537 ml  Net -3537 ml    LBM: Last BM Date: 07/20/21 Baseline Weight: Weight: (!) 208.7 kg Most recent weight: Weight: 107.7 kg      Palliative Assessment/Data:    Advanced Care Planning:   Primary Decision Maker: NEXT OF KIN  Code Status/Advance Care Planning: Full code  Patient and family are clear and expressed goals to continue to treat the treatable aggressively.  They are remaining hopeful for some improvement/stability with awareness of patient's tenuous state.  Confirms full code.   Assessment & Plan:   SUMMARY OF RECOMMENDATIONS   Full Code-as confirmed by family Continue with current plan of care, continue to treat the treatable aggressively.  Patient and family are remaining hopeful for improvement/stability despite awareness of her frail state.  They are hopeful for positive response with trial dialysis.  Some uncertainty regarding expectations of wounds and left leg. PMT will continue to support and follow as needed. Please call team  line with urgent needs.  May not see daily.  Symptom Management:  Pain related to left knee surgery and open wounds Patient currently receiving Oxy IR scheduled.  If anticipating chronic pain needs would consider OxyContin twice daily with consideration of designated outpatient provider would be to continue such regimen. Patient has only received 1 dose over the past 24 hours of Oxy IR for breakthrough.  No adjustments.  Would need further measurements to make any required adjustments given only receiving minimal dosing. Dilaudid as needed.  Patient has only received 3 doses over the past 48 hours.  Given acute orthopedic needs and wounds continuation of medication to assist with bedside procedures. Colace and MiraLAX daily for bowel regimen in the setting of sedentary lifestyle and use of opioids.  Palliative Prophylaxis:  Bowel Regimen, Frequent Pain Assessment, Palliative Wound Care, and Turn Reposition  Additional Recommendations (Limitations, Scope, Preferences): Full Scope Treatment  Psycho-social/Spiritual:  Desire for further Chaplaincy support: no Additional Recommendations:  Ongoing goals of care discussion  Prognosis:  Guarded-poor  Discharge Planning:  To Be Determined    Patient and family expressed understanding and was in agreement with this plan.   Time In: 1305 Time Out: 1400 Time Total: 55 min  Visit consisted of counseling and education dealing with the complex and emotionally intense issues of symptom management and palliative care in the setting of serious and potentially life-threatening illness.Greater than 50%  of this time was spent counseling and coordinating care related to the above assessment and plan.  Signed by:  Alda Lea, AGPCNP-BC Palliative Medicine Team  Phone: [phone] Pager: [phone] Amion: Bjorn Pippin   Thank you for allowing the Palliative Medicine Team to assist in the care of this patient. Please utilize secure  chat with additional questions, if there is no response within 30 minutes please call the above phone number. Palliative Medicine Team providers are available by phone from 7am to 5pm daily and can be reached through the team cell phone.  Should this patient require assistance outside of these hours, please call the patient's attending physician.

## 2021-07-25 NOTE — Progress Notes (Signed)
ANTICOAGULATION CONSULT NOTE - Follow Up Consult  Pharmacy Consult for Warfarin > heparin  Indication: h/o pulmonary embolus  Allergies  Allergen Reactions   Tape     Peels skin on legs   Chlorhexidine Rash    Wipes    Patient Measurements: Height: 6\' 1"  (185.4 cm) Weight: 107.7 kg (237 lb 6.4 oz) IBW/kg (Calculated) : 79.9  Vital Signs: Temp: 97.9 F (36.6 C) (10/29 0717) Temp Source: Oral (10/29 0717) BP: 146/46 (10/29 0800) Pulse Rate: 68 (10/29 0800)  Labs: Recent Labs    07/23/21 0444 07/24/21 0019 07/25/21 0454 07/25/21 0546  HGB 7.2*  --   --  7.3*  HCT 25.2*  --   --  24.9*  PLT 471*  --   --  498*  LABPROT 19.3* 18.2*  --  22.8*  INR 1.6* 1.5*  --  2.0*  CREATININE 2.29* 2.69* 2.84*  --      Estimated Creatinine Clearance: 33.8 mL/min (A) (by C-G formula based on SCr of 2.84 mg/dL (H)).   Medications:  PTA Warfarin: 5 mg on Sun/Mon/Wed/Fri evening, and 7.5 mg on Tue/Thur/Sat evening.   INR was supratherapeutic (6.2) at admission  Assessment: 52 YOM on lifelong warfarin therapy for hx of PE, body habitus, and sedentary lifestyle admitted on 9/15 with left knee dislocation and effusion concerning for hematoma. At admission, warfarin was held, vitamin K was given for supratherapeutic INR (6.2), and pt was transfused.   Patient was on Warfarin which was held on 10/27 with plans for [facility] placement in IR on 10/28. IR was unable to place [facility] since patient weight exceeded IR table weight limit. Pharmacy now consulted to start IV heparin bridge and hold warfarin until temporary HD catheter can be placed.   Goal of Therapy:  Heparin level 0.3-0.7 units/ml Monitor platelets by anticoagulation protocol: Yes   Plan:  - Continue holding Warfarin - Start heparin at 2500 units/hr as he was previously therapeutic on this rate with identical creatinine.  -F/u 8 hr HL -Monitor closely for s/s of bleeding     Thank you for allowing pharmacy to be a part of this  patient's care.  Albertina Parr, PharmD., BCPS, BCCCP Clinical Pharmacist Please refer to [facility] for unit-specific pharmacist   **Pharmacist phone directory can now be found on [email or web address] ([id number]).  Listed under [place].

## 2021-07-25 NOTE — Consult Note (Signed)
[place] Nurse Consult Note: WOC Nursing is consulted for nonhealing left thigh wound.  I have communicated to Dr. Sloan Leiter today via Secure Chat that Dr. Sharol Given has been managing this patient's complex wound and that he is the best provider to reassess and adjust the POC if needed. I defer to him for his experience with this patient and his wound and also his expertise.  [place] nursing team will not follow, but will remain available to this patient, the nursing and medical teams.  Please re-consult if needed. Thanks, Maudie Flakes, MSN, RN, [place], Arther Abbott  Pager# [phone]

## 2021-07-25 NOTE — Progress Notes (Signed)
I had this patient today and yesterday. I was told in report yesterday that he will not turn to get cleaned up and he had not turned for the last 3 days. The wife, the tech and I was in there to change him on 10/28 but he refused to move and he also did not want me to change his dressing yesterday.Today the family wants him to turn on his side to get cleaned up and he said he needed some time for the oxy to kick in. I will check in around 1pm to see if he is willing to lay back and turn.

## 2021-07-25 NOTE — Progress Notes (Signed)
Subjective:  Had TDC placed yesterday and HD with UF 1.5L   Seen after dialysis today; tolerated well with 2L UF.  Desaturated and RT called re: CPAP - sore on nasal bridge so just going on NRB for transport back to room.   Objective Vital signs in last 24 hours: Vitals:   07/25/21 0900 07/25/21 0930 07/25/21 1000 07/25/21 1030  BP: (!) 154/45 (!) 158/59 (!) 155/50 (!) 148/51  Pulse: 69 70 74 70  Resp: 19 17 14 17   Temp:      TempSrc:      SpO2: 93% 94% 97% 99%  Weight:      Height:       Weight change:   Intake/Output Summary (Last 24 hours) at 07/25/2021 1055 Last data filed at 07/25/2021 0323 Gross per 24 hour  Intake --  Output 1550 ml  Net -1550 ml     Assessment/Plan: 65 year old WM with many medical issues admitted after a fall req surgical management of LLE-  hosp complicated by extremely limited mobility-  has developed some A on CRF with BUN out of proprortion to crt-  refractory so it seems edema  1.AKI on CKD:  Etiology of AKI unclear.  u/s and another U/A not giving any clues-  seeming like pre renal insult, ? Cardiorenal?   all nephrotoxic meds have been discontinued. Performing dialysis in this gentleman would be very problematic due to his size and his limited mobility and it is doubtful that he would get to a point where he would be appropriate for OP HD logistically -  Dr. Moshe Cipro had discussion with pt and wife they wish to proceed with HD at least in the short term.  He had HD #1 10/28, #2 today.  At baseline PTA pt could ambulate in the house-  would go to the MD once a month-  riding in car.  I am still pessimistic about his suitability for OP HD.   2. Hypertension/volume  - very volume overloaded-  likely third spacing but also is overloaded-  had been using an albumin/lasix combo to attempt some diuresis mostly so that he can be liberated from the CPAP but that was ineffective at achieving a negative balance so required initiation of HD.  3. Anemia  -  treating with ESA and also IV iron 4. 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, being managed by pharmacy particularly periprocedureally  [place]: Basic Metabolic Panel: Recent Labs  Lab 07/24/21 0019 07/25/21 0454 07/25/21 0546  NA 134* 134* 135  K 4.9 4.9 4.2  CL 102 103 101  CO2 21* 20* 24  GLUCOSE 152* 109* 118*  BUN 145* 118* 120*  CREATININE 2.69* 2.84* 2.92*  CALCIUM 9.0 8.3* 8.6*  PHOS 5.2* 4.4 4.6    Liver Function Tests: Recent Labs  Lab 07/18/21 2128 07/21/21 0259 07/24/21 0019 07/25/21 0454 07/25/21 0546  AST 16  --   --   --   --   ALT 7  --   --   --   --   ALKPHOS 86  --   --   --   --   BILITOT 1.0  --   --   --   --   PROT 5.9*  --   --   --   --   ALBUMIN 2.3*   < > 2.8* 2.5* 2.6*   < > = values in this interval not displayed.    No results  for input(s): LIPASE, AMYLASE in the last 168 hours. No results for input(s): AMMONIA in the last 168 hours. CBC: Recent Labs  Lab 07/20/21 0420 07/21/21 0259 07/22/21 0419 07/23/21 0444 07/25/21 0546  WBC 7.2 6.1 6.1 7.0 7.0  HGB 7.7* 7.0* 7.2* 7.2* 7.3*  HCT 26.5* 23.9* 24.8* 25.2* 24.9*  MCV 94.0 92.6 92.5 94.0 94.0  PLT 450* 463* 470* 471* 498*    Cardiac Enzymes: No results for input(s): CKTOTAL, CKMB, CKMBINDEX, TROPONINI in the last 168 hours.  CBG: Recent Labs  Lab 07/23/21 2142 07/24/21 0736 07/24/21 1317 07/24/21 1803 07/24/21 2106  GLUCAP 146* 155* 141* 118* 109*     Iron Studies:  No results for input(s): IRON, TIBC, TRANSFERRIN, FERRITIN in the last 72 hours.  Studies/Results: IR US Guide Vasc Access Right  Result Date: 07/24/2021 INDICATION: 64 year old gentleman with renal failure requires dialysis access. Due to the patient's weight, procedure could not be performed on the angio table. Procedure performed at bedside with ultrasound guidance. The position of the catheter was confirmed on chest radiograph. EXAM: Ultrasound-guided right IJ temporary hemodialysis  catheter placement. MEDICATIONS: None ANESTHESIA/SEDATION: None FLUOROSCOPY TIME:  None COMPLICATIONS: None immediate. PROCEDURE: Informed written consent was obtained from the patient after a thorough discussion of the procedural risks, benefits and alternatives. All questions were addressed. Maximal Sterile Barrier Technique was utilized including caps, mask, sterile gowns, sterile gloves, sterile drape, hand hygiene and skin antiseptic. A timeout was performed prior to the initiation of the procedure. Right neck prepped and draped in usual fashion. Ultrasound image documenting patency of the right internal jugular vein was obtained and placed in permanent medical record. Sterile ultrasound probe cover and sterile gel utilized throughout the procedure. Using continuous ultrasound guidance, the right internal jugular vein was accessed with a 21 gauge needle. 21 gauge needle exchanged for transitional dilator set over 0.018 inch guidewire. 0.035 inch guidewire advanced through the 5 [place] dilator without difficulty. Serial dilation was performed over the 0.035 inch guidewire and 15 cm dialysis catheter was inserted. All lumens aspirated and flushed well. The dialysis lumens were locked with heparin. The catheter was secured to skin with suture at and the insert site was covered with bio patch and sterile dressing. Post placement radiograph showed the catheter tip in the superior vena cava. IMPRESSION: 1. 15 cm right IJ temporary hemodialysis catheter is ready for use. 2. Tunneled hemodialysis catheter was not placed as the patient's weight was above the threshold of the angiography table. Electronically Signed   By: Miachel Roux M.D.   On: 07/24/2021 14:42   DG Chest Port 1 View  Result Date: 07/24/2021 CLINICAL DATA:  Temporary hemodialysis catheter placement EXAM: PORTABLE CHEST 1 VIEW COMPARISON:  07/20/2021 FINDINGS: The heart size within normal limits. Mild interval worsening of pulmonary vascular  congestion. No pneumothorax. Bibasilar patchy opacity likely result of atelectasis. Newly placed right IJ dialysis catheter terminates in the region of the superior vena cava. IMPRESSION: 1. Newly placed right IJ temporary hemodialysis catheter terminates in the region of the superior vena cava. 2. Mild interval worsening of pulmonary vascular congestion and bibasilar atelectasis. Electronically Signed   By: Miachel Roux M.D.   On: 07/24/2021 14:43   Medications: Infusions:  sodium chloride 10 mL/hr at 07/13/21 1107   heparin      Scheduled Medications:  (feeding supplement) PROSource Plus  30 mL Oral BID BM   vitamin C  1,000 mg Oral Daily   cephALEXin  250 mg Oral QHS  darbepoetin (ARANESP) injection - NON-DIALYSIS  200 mcg Subcutaneous Q Mon-1800   docusate sodium  100 mg Oral Daily   feeding supplement (GLUCERNA SHAKE)  237 mL Oral Q24H   FLUoxetine  60 mg Oral QPM   furosemide  80 mg Intravenous Q12H   heparin sodium (porcine)  2,400 Units Intravenous Once   influenza vac split quadrivalent PF  0.5 mL Intramuscular Tomorrow-1000   insulin aspart  0-20 Units Subcutaneous TID WC   insulin aspart  0-5 Units Subcutaneous QHS   insulin aspart  3 Units Subcutaneous TID WC   insulin glargine-yfgn  55 Units Subcutaneous QHS   methocarbamol  1,000 mg Oral BID   multivitamin with minerals  1 tablet Oral Daily   nutrition supplement (JUVEN)  1 packet Oral BID BM   oxyCODONE  10 mg Oral Daily   pantoprazole  40 mg Oral Daily   pioglitazone  45 mg Oral Daily   polyethylene glycol  17 g Oral BID   Ensure Max Protein  11 oz Oral Daily   senna  1 tablet Oral Daily   Warfarin - Pharmacist Dosing Inpatient   Does not apply q1600    have reviewed scheduled and prn medications.  Physical Exam: General:  morbidly obese- Heart: RRR Lungs: CBS bilat on nasal pillow cpap Abdomen: obese-  abd wall edema  Extremities: pitting edema-  external fixator on left    07/25/2021,10:55 AM  LOS: 43  days

## 2021-07-26 DIAGNOSIS — Z7189 Other specified counseling: Secondary | ICD-10-CM | POA: Diagnosis not present

## 2021-07-26 DIAGNOSIS — Z515 Encounter for palliative care: Secondary | ICD-10-CM | POA: Diagnosis not present

## 2021-07-26 DIAGNOSIS — S83105A Unspecified dislocation of left knee, initial encounter: Secondary | ICD-10-CM | POA: Diagnosis not present

## 2021-07-26 LAB — GLUCOSE, CAPILLARY
Glucose-Capillary: 124 mg/dL — ABNORMAL HIGH (ref 70–99)
Glucose-Capillary: 112 mg/dL — ABNORMAL HIGH (ref 70–99)
Glucose-Capillary: 114 mg/dL — ABNORMAL HIGH (ref 70–99)
Glucose-Capillary: 123 mg/dL — ABNORMAL HIGH (ref 70–99)

## 2021-07-26 LAB — CBC
HCT: 27.5 % — ABNORMAL LOW (ref 39.0–52.0)
Hemoglobin: 7.8 g/dL — ABNORMAL LOW (ref 13.0–17.0)
MCH: 26.5 pg (ref 26.0–34.0)
MCHC: 28.4 g/dL — ABNORMAL LOW (ref 30.0–36.0)
MCV: 93.5 fL (ref 80.0–100.0)
Platelets: 528 10*3/uL — ABNORMAL HIGH (ref 150–400)
RBC: 2.94 MIL/uL — ABNORMAL LOW (ref 4.22–5.81)
RDW: 17.3 % — ABNORMAL HIGH (ref 11.5–15.5)
WBC: 7.5 10*3/uL (ref 4.0–10.5)
nRBC: 0.3 % — ABNORMAL HIGH (ref 0.0–0.2)

## 2021-07-26 LAB — PROTIME-INR
INR: 2 — ABNORMAL HIGH (ref 0.8–1.2)
Prothrombin Time: 23.1 seconds — ABNORMAL HIGH (ref 11.4–15.2)

## 2021-07-26 LAB — RENAL FUNCTION PANEL
Albumin: 2.5 g/dL — ABNORMAL LOW (ref 3.5–5.0)
Anion gap: 9 (ref 5–15)
BUN: 82 mg/dL — ABNORMAL HIGH (ref 8–23)
CO2: 25 mmol/L (ref 22–32)
Calcium: 8.9 mg/dL (ref 8.9–10.3)
Chloride: 100 mmol/L (ref 98–111)
Creatinine, Ser: 2.81 mg/dL — ABNORMAL HIGH (ref 0.61–1.24)
GFR, Estimated: 24 mL/min — ABNORMAL LOW (ref >60)
Glucose, Bld: 134 mg/dL — ABNORMAL HIGH (ref 70–99)
Phosphorus: 4.5 mg/dL (ref 2.5–4.6)
Potassium: 4 mmol/L (ref 3.5–5.1)
Sodium: 134 mmol/L — ABNORMAL LOW (ref 135–145)

## 2021-07-26 LAB — HEPARIN LEVEL (UNFRACTIONATED): Heparin Unfractionated: 0.39 IU/mL (ref 0.30–0.70)

## 2021-07-26 MED ORDER — OXYCODONE HCL ER 10 MG PO T12A
10.0000 mg | EXTENDED_RELEASE_TABLET | Freq: Two times a day (BID) | ORAL | Status: DC
  Administered 2021-07-26 – 2021-07-27 (×3): 10 mg via ORAL
  Filled 2021-07-26 (×3): qty 1

## 2021-07-26 NOTE — Progress Notes (Signed)
Subjective:  Had uneventful HD with UF 1.9L yesterday.  UOP not recorded from yesterday but has 256mL in [place] now. Did miss scheduled pain meds due to AM dialysis then took a while to catch up.  Will ask he has scheduled pain meds prior to dialysis.   Objective Vital signs in last 24 hours: Vitals:   07/25/21 2327 07/26/21 0333 07/26/21 0334 07/26/21 0753  BP: (!) 144/52  (!) 159/44 (!) 162/69  Pulse: 71 77 76 77  Resp: 17 15 16 16   Temp: 97.6 F (36.4 C) 97.7 F (36.5 C) 97.7 F (36.5 C) 98 F (36.7 C)  TempSrc: Oral Oral Oral Oral  SpO2: 96% 98% 98% 97%  Weight:      Height:       Weight change: -108.8 kg  Intake/Output Summary (Last 24 hours) at 07/26/2021 1107 Last data filed at 07/26/2021 0800 Gross per 24 hour  Intake 654.18 ml  Output --  Net 654.18 ml     Assessment/Plan: 65 year old WM with many medical issues admitted after a fall req surgical management of LLE-  hosp complicated by extremely limited mobility-  has developed some A on CRF with BUN out of proprortion to crt-  refractory so it seems edema  1.AKI on CKD:  Baseline seems to be in the 1.5 range. Etiology of AKI unclear.  u/s and another U/A not giving any clues-  seeming like pre renal insult, ? Cardiorenal?   all nephrotoxic meds have been discontinued. Performing dialysis in this gentleman would be very problematic due to his size and his limited mobility and it is doubtful that he would get to a point where he would be appropriate for OP HD logistically -  Dr. Moshe Cipro had discussion with pt and wife they wish to proceed with HD at least in the short term.  He had HD #1 10/28, #2 10/29.  At baseline PTA pt could ambulate in the house-  would go to the MD once a month-  riding in car.  I am still pessimistic about his suitability for OP HD.  Best case scenario is that the AKI resolves and he is liberated from dialysis.   2. Hypertension/volume  - very volume overloaded-  likely third spacing but also is  overloaded-  had been using an albumin/lasix combo to attempt some diuresis but that was ineffective at achieving a negative balance so required initiation of HD.  3. Anemia  - treating with ESA and also IV iron 4. 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 No results for input(s): AMMONIA in the last 168 hours. CBC: Recent Labs  Lab 07/21/21 0259 07/22/21 0419 07/23/21 0444 07/25/21 0546 07/26/21 0630  WBC 6.1 6.1 7.0 7.0 7.5  HGB 7.0* 7.2* 7.2* 7.3* 7.8*  HCT 23.9* 24.8* 25.2* 24.9* 27.5*  MCV 92.6 92.5 94.0 94.0 93.5  PLT 463* 470*  471* 498* 528*    Cardiac Enzymes: No results for input(s): CKTOTAL, CKMB, CKMBINDEX, TROPONINI in the last 168 hours.  CBG: Recent Labs  Lab 07/24/21 2106 07/25/21 1141 07/25/21 1733 07/25/21 2141 07/26/21 0749  GLUCAP 109* 113* 149* 139* 124*     Iron Studies:  No results for input(s): IRON, TIBC, TRANSFERRIN, FERRITIN in the last 72 hours.  Studies/Results: IR US Guide Vasc Access Right  Result Date: 07/24/2021 INDICATION: 65 year old gentleman with renal failure requires dialysis access. Due to the patient's weight, procedure could not be performed on the angio table. Procedure performed at bedside with ultrasound guidance. The position of the catheter was confirmed on chest radiograph. EXAM: Ultrasound-guided right IJ temporary hemodialysis catheter placement. MEDICATIONS: None ANESTHESIA/SEDATION: None  FLUOROSCOPY TIME:  None COMPLICATIONS: None immediate. PROCEDURE: Informed written consent was obtained from the patient after a thorough discussion of the procedural risks, benefits and alternatives. All questions were addressed. Maximal Sterile Barrier Technique was utilized including caps, mask, sterile gowns, sterile gloves, sterile drape, hand hygiene and skin antiseptic. A timeout was performed prior to the initiation of the procedure. Right neck prepped and draped in usual fashion. Ultrasound image documenting patency of the right internal jugular vein was obtained and placed in permanent medical record. Sterile ultrasound probe cover and sterile gel utilized throughout the procedure. Using continuous ultrasound guidance, the right internal jugular vein was accessed with a 21 gauge needle. 21 gauge needle exchanged for transitional dilator set over 0.018 inch guidewire. 0.035 inch guidewire advanced through the 5 [place] dilator without difficulty. Serial dilation was performed over the 0.035 inch guidewire and 15 cm dialysis catheter was inserted. All lumens aspirated and flushed well. The dialysis lumens were locked with heparin. The catheter was secured to skin with suture at and the insert site was covered with bio patch and sterile dressing. Post placement radiograph showed the catheter tip in the superior vena cava. IMPRESSION: 1. 15 cm right IJ temporary hemodialysis catheter is ready for use. 2. Tunneled hemodialysis catheter was not placed as the patient's weight was above the threshold of the angiography table. Electronically Signed   By: Miachel Roux M.D.   On: 07/24/2021 14:42   DG Chest Port 1 View  Result Date: 07/24/2021 CLINICAL DATA:  Temporary hemodialysis catheter placement EXAM: PORTABLE CHEST 1 VIEW COMPARISON:  07/20/2021 FINDINGS: The heart size within normal limits. Mild interval worsening of pulmonary vascular congestion. No pneumothorax. Bibasilar patchy opacity likely result of  atelectasis. Newly placed right IJ dialysis catheter terminates in the region of the superior vena cava. IMPRESSION: 1. Newly placed right IJ temporary hemodialysis catheter terminates in the region of the superior vena cava. 2. Mild interval worsening of pulmonary vascular congestion and bibasilar atelectasis. Electronically Signed   By: Miachel Roux M.D.   On: 07/24/2021 14:43   Medications: Infusions:  sodium chloride 10 mL/hr at 07/13/21 1107   heparin 2,900 Units/hr (07/26/21 0618)    Scheduled Medications:  (feeding supplement) PROSource Plus  30 mL Oral BID BM   vitamin C  1,000 mg Oral Daily   cephALEXin  250 mg Oral QHS   darbepoetin (ARANESP) injection - NON-DIALYSIS  200 mcg Subcutaneous Q Mon-1800   docusate sodium  100 mg Oral Daily   feeding supplement (GLUCERNA SHAKE)  237 mL Oral Q24H   FLUoxetine  60 mg Oral QPM   furosemide  80 mg Intravenous Q12H   influenza vac split quadrivalent PF  0.5 mL Intramuscular Tomorrow-1000   insulin aspart  0-20 Units Subcutaneous TID WC   insulin aspart  0-5 Units Subcutaneous QHS   insulin aspart  3 Units Subcutaneous TID WC   insulin glargine-yfgn  55 Units Subcutaneous QHS   methocarbamol  1,000 mg Oral BID   multivitamin with minerals  1 tablet Oral Daily   nutrition supplement (JUVEN)  1 packet Oral BID BM   oxyCODONE  10 mg Oral Q12H   pantoprazole  40 mg Oral Daily   pioglitazone  45 mg Oral Daily   polyethylene glycol  17 g Oral BID   Ensure Max Protein  11 oz Oral Daily   senna  1 tablet Oral Daily    have reviewed scheduled and prn medications.  Physical Exam: General:  morbidly obese- Heart: RRR Lungs: CBS bilat on nasal pillow cpap Abdomen: obese-  abd wall edema  Extremities: pitting edema-  external fixator on left    07/26/2021,11:07 AM  LOS: 44 days

## 2021-07-26 NOTE — Progress Notes (Signed)
ANTICOAGULATION CONSULT NOTE - Follow Up Consult  Pharmacy Consult for heparin  Indication: h/o pulmonary embolus  Allergies  Allergen Reactions   Tape     Peels skin on legs   Chlorhexidine Rash    Wipes    Patient Measurements: Height: 6\' 1"  (185.4 cm) Weight: 107.7 kg (237 lb 6.4 oz) IBW/kg (Calculated) : 79.9 Heparin dosing wt: 98kg  Vital Signs: Temp: 98 F (36.7 C) (10/30 0753) Temp Source: Oral (10/30 0753) BP: 162/69 (10/30 0753) Pulse Rate: 77 (10/30 0753)  Labs: Recent Labs    07/24/21 0019 07/25/21 0454 07/25/21 0546 07/25/21 1658 07/26/21 0630  HGB  --   --  7.3*  --  7.8*  HCT  --   --  24.9*  --  27.5*  PLT  --   --  498*  --  528*  LABPROT 18.2*  --  22.8*  --  23.1*  INR 1.5*  --  2.0*  --  2.0*  HEPARINUNFRC  --   --   --  <0.10* 0.39  CREATININE 2.69* 2.84* 2.92*  --  2.81*     Estimated Creatinine Clearance: 34.2 mL/min (A) (by C-G formula based on SCr of 2.81 mg/dL (H)).   Medications:  PTA Warfarin: 5 mg on Sun/Mon/Wed/Fri evening, and 7.5 mg on Tue/Thur/Sat evening.   INR was supratherapeutic (6.2) at admission  Assessment: 50 YOM on lifelong warfarin therapy for hx of PE, body habitus, and sedentary lifestyle admitted on 9/15 with left knee dislocation and effusion concerning for hematoma. At admission, warfarin was held, vitamin K was given for supratherapeutic INR (6.2), and pt was transfused.   Patient has temporary dialysis catheter in place. Waiting on placement of permacath. Bridging with IV heparin until then.   Heparin level is now therapeutic on 2900units/hr. INR is 2, will not bolus at this time. H/H improving. Plt elevated. Per RN, no s/s of overt bleeding noted.     Goal of Therapy:  Heparin level 0.3-0.7 units/ml Monitor platelets by anticoagulation protocol: Yes   Plan:  Continue holding Warfarin Continue heparin at 2900 units/hr  Monitor daily INR, HL, CBC/plt Monitor for signs/symptoms of bleeding     Thank  you for allowing pharmacy to be a part of this patient's care.  Albertina Parr, PharmD., BCPS, BCCCP Clinical Pharmacist Please refer to [facility] for unit-specific pharmacist

## 2021-07-26 NOTE — Progress Notes (Signed)
Patient currently on home CPAP resting comfortably.

## 2021-07-26 NOTE — Progress Notes (Signed)
PROGRESS NOTE    [name].  GLO:[id number] DOB: 1955-12-15 DOA: 06/11/2021 PCP: Donald Prose, MD    Brief Narrative:  65 year old gentleman with history of super morbid obesity, history of sleep apnea on CPAP, type 2 diabetes on insulin, hypertension, hyperlipidemia, peripheral artery disease, DVT on Coumadin, GERD, anxiety and depression and multiple falls presented to the emergency room on 9/15 after a fall at home resulting in left knee pain.  He was found to have left knee dislocation and fracture fragments with left knee effusion.  Underwent close reduction in the ER and admitted to the hospital.  9/15, admitted with close reduction but continues pain. 9/30, MRI with significant ligamentous injury and lateral patellar dislocation.  Left knee extensive debridement, reduction of dislocation, external fixation with wound VAC application for chronic left thigh wound. On CPAP for sleep apnea Also received 2 units of PRBC and 1 unit of FFP for supratherapeutic INR during hospital course. 10/22, noted shortness of breath on talking.  Hospitalization prolonged with severe physical deconditioning and remains with very poor respiratory status.  Nephrology consulted. 10/28, decided to start on hemodialysis due to persistent fluid retention and decreased urine output. Started on hemodialysis with temporary dialysis catheter.   Assessment & Plan:   Principal Problem:   Left knee dislocation, initial encounter Active Problems:   Pure hypercholesterolemia   Essential hypertension, benign   OSA (obstructive sleep apnea)   Hx pulmonary embolism   Esophageal reflux   Diabetes mellitus type 2, uncontrolled   Diabetic neuropathy (HCC)   Acute renal failure superimposed on stage 3b chronic kidney disease (HCC)   Normocytic anemia   Class 3 obesity (HCC)   Aortic atherosclerosis (HCC)   Coronary atherosclerosis   Effusion of left knee   Left knee dislocation   Pressure injury of skin    Unstageable pressure ulcer of sacral region ([place])   At risk for hemorrhage associated with anticoagulation therapy   Ulcer of left knee, with necrosis of bone ([place])   Open knee wound, left, subsequent encounter   Moderate episode of recurrent major depressive disorder ([place])  Fall, left knee hematoma and dislocation: Surgical reduction with external fixation, hematoma evacuation and skin grafting by Dr. Sharol Given. Skin graft failed, now has open wound and receiving daily dressing.  Followed by surgery. Mobility has been challenging. Weightbearing as tolerated if he can. Adequate pain medications that has been challenging. Change to oxycodone IR 10 mg BID with aim to increase the long acting oxycodone. Continue PRN oxy and dilaudid iv for short term pain relief for dressing change and mobility. also on Robaxin.  Still using IV pain medications for intermittent pain control. Difficulty healing his left thigh wound due to immobility and multiple medical factors.  Anasarca and pulmonary edema and acute renal failure: secondary to severe hypoalbuminemia and fluid overload.  Intermittently treated with IV Lasix with no significant results. Significant leg edema and scrotal edema.  No significant urine output. Patient could not fit on the radiology table to insert a permacath, a temporary dialysis catheter was placed and started on hemodialysis 10/28, 10/29. Next HD 10/30.  Acute on chronic hypoxemic and hypercarbic respiratory failure, obstructive sleep apnea:  Using CPAP at night.  Uses CPAP most of the time with breaks for comfort. Intermittently gets shortness of breath and unable to maintain on CPAP, uses BiPAP.  Acute metabolic encephalopathy: Resolved.  Avoid Ativan.  Type 2 diabetes: Controlled.  On Lantus and Premeal insulin.  Also on Actos.  Essential hypertension: Blood pressure stable.  History of DVT/PE: Coumadin on hold for procedure.  Has a history of DVT and PE and very high risk of  developing another thromboembolic event due to morbid obesity and bedbound status.  Will bridge with heparin until patient gets a permacath.  Anxiety disorder: On Prozac every morning and hydroxyzine as needed.  Ativan discontinued.  Severe morbid obesity: BMI 64.  Anemia of chronic disease: Currently getting iron transfusions.  Hemoglobin 7-7.2.  Blood transfusion will put him on fluid overload.  Receiving iron transfusions.  Hemoglobin 7.8.  If further drops, will benefit with transfusion along with hemodialysis.  Physical deconditioning anxiety and lethargy limiting his ability to participate in PT. Poor recovery due to severe morbid obesity, nonhealing wounds, pain and unable to come off the CPAP around-the-clock.   DVT prophylaxis:   Heparin infusion.   Code Status: Full code. Family Communication: Wife and daughter at the bedside. Disposition Plan: Status is: Inpatient  Remains inpatient appropriate because: Around-the-clock CPAP. unsafe discharge because of active pain management, IV pain medications.  Renal function recovery.       Consultants:  Orthopedics Pulmonary critical care Nephrology Palliative care  Procedures:  Multiple procedures applicable  Antimicrobials:  Multiple antibiotics, currently on Keflex   Subjective:  Patient seen and examined.  Wife daughter and grandchild at the bedside.  Patient himself tells me that he had severe pain after dressing removal by Dr. Sharol Given.  He however slept better last night.  He had no trouble with getting hemodialysis, however he had some trouble with completing oxygen. He was wondering whether I can regulate his pain medications to help him achieve some pain relief. Multiple questions about the thigh wound and external fixators by family.  Objective: Vitals:   07/25/21 2327 07/26/21 0333 07/26/21 0334 07/26/21 0753  BP: (!) 144/52  (!) 159/44 (!) 162/69  Pulse: 71 77 76 77  Resp: 17 15 16 16   Temp: 97.6 F (36.4  C) 97.7 F (36.5 C) 97.7 F (36.5 C) 98 F (36.7 C)  TempSrc: Oral Oral Oral Oral  SpO2: 96% 98% 98% 97%  Weight:      Height:        Intake/Output Summary (Last 24 hours) at 07/26/2021 1124 Last data filed at 07/26/2021 0800 Gross per 24 hour  Intake 654.18 ml  Output --  Net 654.18 ml   Filed Weights   07/08/21 0958 07/24/21 1715 07/25/21 0717  Weight: (!) 218 kg (!) 216.5 kg 107.7 kg    Examination:  General exam: Morbidly obese gentleman laying in bed on CPAP.  Anxious but comfortable.  Can talk in complete sentences. Respiratory system: Poor bilateral air entry but no added sounds.  Difficult to auscultate. Cardiovascular system: S1 & S2 heard, RRR.  Right IJ hemodialysis catheter present. Gastrointestinal system: Soft.  Obese and pendulous.  Bowel sounds present.  Nontender.   Central nervous system: Alert and oriented. No focal neurological deficits.  Generalized weakness. Extremities:  Right lower extremity with chronic venous stasis changes and pigmentation, tense edema. Left lower extremity with big open wound anterior thigh with serosanguineous discharge, external fixator in place.   He does have scrotal edema. Tense edema of the dorsum of the feet.    Data Reviewed: I have personally reviewed following labs and imaging studies  CBC: Recent Labs  Lab 07/21/21 0259 07/22/21 0419 07/23/21 0444 07/25/21 0546 07/26/21 0630  WBC 6.1 6.1 7.0 7.0 7.5  HGB 7.0* 7.2* 7.2* 7.3* 7.8*  HCT 23.9* 24.8* 25.2* 24.9* 27.5*  MCV 92.6 92.5 94.0 94.0 93.5  PLT 463* 470* 471* 498* 099*   Basic Metabolic Panel: Recent Labs  Lab 07/23/21 0444 07/24/21 0019 07/25/21 0454 07/25/21 0546 07/26/21 0630  NA 135 134* 134* 135 134*  K 4.6 4.9 4.9 4.2 4.0  CL 103 102 103 101 100  CO2 24 21* 20* 24 25  GLUCOSE 151* 152* 109* 118* 134*  BUN 139* 145* 118* 120* 82*  CREATININE 2.29* 2.69* 2.84* 2.92* 2.81*  CALCIUM 8.9 9.0 8.3* 8.6* 8.9  PHOS 5.2* 5.2* 4.4 4.6 4.5    GFR: Estimated Creatinine Clearance: 34.2 mL/min (A) (by C-G formula based on SCr of 2.81 mg/dL (H)). Liver Function Tests: Recent Labs  Lab 07/23/21 0444 07/24/21 0019 07/25/21 0454 07/25/21 0546 07/26/21 0630  ALBUMIN 2.6* 2.8* 2.5* 2.6* 2.5*   No results for input(s): LIPASE, AMYLASE in the last 168 hours. No results for input(s): AMMONIA in the last 168 hours. Coagulation Profile: Recent Labs  Lab 07/22/21 0419 07/23/21 0444 07/24/21 0019 07/25/21 0546 07/26/21 0630  INR 1.5* 1.6* 1.5* 2.0* 2.0*   Cardiac Enzymes: No results for input(s): CKTOTAL, CKMB, CKMBINDEX, TROPONINI in the last 168 hours.  BNP (last 3 results) No results for input(s): PROBNP in the last 8760 hours. HbA1C: No results for input(s): HGBA1C in the last 72 hours. CBG: Recent Labs  Lab 07/24/21 2106 07/25/21 1141 07/25/21 1733 07/25/21 2141 07/26/21 0749  GLUCAP 109* 113* 149* 139* 124*   Lipid Profile: No results for input(s): CHOL, HDL, LDLCALC, TRIG, CHOLHDL, LDLDIRECT in the last 72 hours. Thyroid Function Tests: No results for input(s): TSH, T4TOTAL, FREET4, T3FREE, THYROIDAB in the last 72 hours. Anemia Panel: No results for input(s): VITAMINB12, FOLATE, FERRITIN, TIBC, IRON, RETICCTPCT in the last 72 hours.  Sepsis Labs: No results for input(s): PROCALCITON, LATICACIDVEN in the last 168 hours.  No results found for this or any previous visit (from the past 240 hour(s)).       Radiology Studies: IR US Guide Vasc Access Right  Result Date: 07/24/2021 INDICATION: 65 year old gentleman with renal failure requires dialysis access. Due to the patient's weight, procedure could not be performed on the angio table. Procedure performed at bedside with ultrasound guidance. The position of the catheter was confirmed on chest radiograph. EXAM: Ultrasound-guided right IJ temporary hemodialysis catheter placement. MEDICATIONS: None ANESTHESIA/SEDATION: None FLUOROSCOPY TIME:  None  COMPLICATIONS: None immediate. PROCEDURE: Informed written consent was obtained from the patient after a thorough discussion of the procedural risks, benefits and alternatives. All questions were addressed. Maximal Sterile Barrier Technique was utilized including caps, mask, sterile gowns, sterile gloves, sterile drape, hand hygiene and skin antiseptic. A timeout was performed prior to the initiation of the procedure. Right neck prepped and draped in usual fashion. Ultrasound image documenting patency of the right internal jugular vein was obtained and placed in permanent medical record. Sterile ultrasound probe cover and sterile gel utilized throughout the procedure. Using continuous ultrasound guidance, the right internal jugular vein was accessed with a 21 gauge needle. 21 gauge needle exchanged for transitional dilator set over 0.018 inch guidewire. 0.035 inch guidewire advanced through the 5 [place] dilator without difficulty. Serial dilation was performed over the 0.035 inch guidewire and 15 cm dialysis catheter was inserted. All lumens aspirated and flushed well. The dialysis lumens were locked with heparin. The catheter was secured to skin with suture at and the insert site was covered with bio patch and sterile dressing. Post placement  radiograph showed the catheter tip in the superior vena cava. IMPRESSION: 1. 15 cm right IJ temporary hemodialysis catheter is ready for use. 2. Tunneled hemodialysis catheter was not placed as the patient's weight was above the threshold of the angiography table. Electronically Signed   By: Miachel Roux M.D.   On: 07/24/2021 14:42   DG Chest Port 1 View  Result Date: 07/24/2021 CLINICAL DATA:  Temporary hemodialysis catheter placement EXAM: PORTABLE CHEST 1 VIEW COMPARISON:  07/20/2021 FINDINGS: The heart size within normal limits. Mild interval worsening of pulmonary vascular congestion. No pneumothorax. Bibasilar patchy opacity likely result of atelectasis. Newly  placed right IJ dialysis catheter terminates in the region of the superior vena cava. IMPRESSION: 1. Newly placed right IJ temporary hemodialysis catheter terminates in the region of the superior vena cava. 2. Mild interval worsening of pulmonary vascular congestion and bibasilar atelectasis. Electronically Signed   By: Miachel Roux M.D.   On: 07/24/2021 14:43        Scheduled Meds:  (feeding supplement) PROSource Plus  30 mL Oral BID BM   vitamin C  1,000 mg Oral Daily   cephALEXin  250 mg Oral QHS   darbepoetin (ARANESP) injection - NON-DIALYSIS  200 mcg Subcutaneous Q Mon-1800   docusate sodium  100 mg Oral Daily   feeding supplement (GLUCERNA SHAKE)  237 mL Oral Q24H   FLUoxetine  60 mg Oral QPM   furosemide  80 mg Intravenous Q12H   influenza vac split quadrivalent PF  0.5 mL Intramuscular Tomorrow-1000   insulin aspart  0-20 Units Subcutaneous TID WC   insulin aspart  0-5 Units Subcutaneous QHS   insulin aspart  3 Units Subcutaneous TID WC   insulin glargine-yfgn  55 Units Subcutaneous QHS   methocarbamol  1,000 mg Oral BID   multivitamin with minerals  1 tablet Oral Daily   nutrition supplement (JUVEN)  1 packet Oral BID BM   oxyCODONE  10 mg Oral Q12H   pantoprazole  40 mg Oral Daily   pioglitazone  45 mg Oral Daily   polyethylene glycol  17 g Oral BID   Ensure Max Protein  11 oz Oral Daily   senna  1 tablet Oral Daily   Continuous Infusions:  sodium chloride 10 mL/hr at 07/13/21 1107   heparin 2,900 Units/hr (07/26/21 0618)     LOS: 44 days    Time spent: 30 minutes    Barb Merino, MD [facility] Pager [phone]

## 2021-07-26 NOTE — Progress Notes (Signed)
   Palliative Medicine Inpatient Follow Up Note   HPI: Palliative Care consult requested on hospital day 42 for symptom management and goals of care discussion in this 65 y.o. male  with past medical history of morbid obesity, falls, sleep apnea (CPAP), type 2 diabetes, hyperlipidemia, PAD, hypertension, DVT (Coumadin), anxiety, depression, and GERD.  He was admitted on 06/11/2021 from home with s/p fall resulting in knee dislocation.  Hospital course complicated by closed reduction of left knee dislocation, significant ligamentous injury and lateral patellar dislocation s/p extensive debridement, reduction of dislocation, external fixation with wound VAC to chronic left thigh wound, respiratory failure, acute on chronic kidney failure requiring short-term dialysis.  Today's Discussion (07/26/2021):  *Please note that this is a verbal dictation therefore any spelling or grammatical errors are due to the "[place]" system interpretation.  Chart reviewed.   I met with Joseph [name] and his daughter at bedside. We reviewed their conversation from the day prior with my colleague, Lexine Baton. We reviewed that presently  his pain is under fair control.  Patient's daughter shares that she spoke to the orthopedist this morning and feels reassured by the conversation about the patient's exudate.  A formal review of the "hard choices for loving people booklet" and MOST form was completed.  I provided these resources for patient's family to review and discuss for completion.  Questions and concerns addressed   Palliative support provided  Objective Assessment: Vital Signs Vitals:   07/26/21 0753 07/26/21 1227  BP: (!) 162/69 (!) 156/45  Pulse: 77 76  Resp: 16 16  Temp: 98 F (36.7 C) 98.3 F (36.8 C)  SpO2: 97% 98%    Intake/Output Summary (Last 24 hours) at 07/26/2021 1301 Last data filed at 07/26/2021 0800 Gross per 24 hour  Intake 654.18 ml  Output --  Net 654.18 ml   Last Weight  Most  recent update: 07/25/2021  7:34 AM    Weight  107.7 kg (237 lb 6.4 oz)            Physical Exam General: NAD, morbidly obese chronically-ill appearing Cardiovascular: regular rate and rhythm,  Pulmonary: CPAP in place, normal breathing pattern Abdomen: soft, nontender, + bowel sounds Extremities: Anasarca, bilateral lower extremity pitting edema Skin: no rashes, warm and dry, ulceration to nose bridge Neurological: AAOx3, mood appropriate  SUMMARY OF RECOMMENDATIONS   Full Code-as confirmed by family MOST provided for review and completion  Continue with current plan of care, continue to treat the treatable aggressively.  Patient and family are remaining hopeful for improvement/stability despite awareness of his frail state.   PMT will continue to support and follow as needed   Time Spent: 25 Greater than 50% of the time was spent in counseling and coordination of care ______________________________________________________________________________________ [place] Team Team Cell Phone: [phone] Please utilize secure chat with additional questions, if there is no response within 30 minutes please call the above phone number  Palliative Medicine Team providers are available by phone from 7am to 7pm daily and can be reached through the team cell phone.  Should this patient require assistance outside of these hours, please call the patient's attending physician.

## 2021-07-27 LAB — CBC
HCT: 28.7 % — ABNORMAL LOW (ref 39.0–52.0)
Hemoglobin: 8.3 g/dL — ABNORMAL LOW (ref 13.0–17.0)
MCH: 27.1 pg (ref 26.0–34.0)
MCHC: 28.9 g/dL — ABNORMAL LOW (ref 30.0–36.0)
MCV: 93.8 fL (ref 80.0–100.0)
Platelets: 461 10*3/uL — ABNORMAL HIGH (ref 150–400)
RBC: 3.06 MIL/uL — ABNORMAL LOW (ref 4.22–5.81)
RDW: 17.3 % — ABNORMAL HIGH (ref 11.5–15.5)
WBC: 7.7 10*3/uL (ref 4.0–10.5)
nRBC: 0.4 % — ABNORMAL HIGH (ref 0.0–0.2)

## 2021-07-27 LAB — RENAL FUNCTION PANEL
Albumin: 2.4 g/dL — ABNORMAL LOW (ref 3.5–5.0)
Anion gap: 9 (ref 5–15)
BUN: 91 mg/dL — ABNORMAL HIGH (ref 8–23)
CO2: 26 mmol/L (ref 22–32)
Calcium: 8.7 mg/dL — ABNORMAL LOW (ref 8.9–10.3)
Chloride: 99 mmol/L (ref 98–111)
Creatinine, Ser: 3.32 mg/dL — ABNORMAL HIGH (ref 0.61–1.24)
GFR, Estimated: 20 mL/min — ABNORMAL LOW (ref >60)
Glucose, Bld: 150 mg/dL — ABNORMAL HIGH (ref 70–99)
Phosphorus: 5.3 mg/dL — ABNORMAL HIGH (ref 2.5–4.6)
Potassium: 4.2 mmol/L (ref 3.5–5.1)
Sodium: 134 mmol/L — ABNORMAL LOW (ref 135–145)

## 2021-07-27 LAB — PROTIME-INR
INR: 2.4 — ABNORMAL HIGH (ref 0.8–1.2)
Prothrombin Time: 26 seconds — ABNORMAL HIGH (ref 11.4–15.2)

## 2021-07-27 LAB — HEPARIN LEVEL (UNFRACTIONATED): Heparin Unfractionated: 0.38 IU/mL (ref 0.30–0.70)

## 2021-07-27 LAB — GLUCOSE, CAPILLARY
Glucose-Capillary: 139 mg/dL — ABNORMAL HIGH (ref 70–99)
Glucose-Capillary: 79 mg/dL (ref 70–99)
Glucose-Capillary: 155 mg/dL — ABNORMAL HIGH (ref 70–99)

## 2021-07-27 MED ORDER — HEPARIN SODIUM (PORCINE) 1000 UNIT/ML IJ SOLN
2400.0000 [IU] | Freq: Once | INTRAMUSCULAR | Status: AC
  Administered 2021-07-27: 2400 [IU] via INTRAVENOUS
  Filled 2021-07-27: qty 3

## 2021-07-27 NOTE — Progress Notes (Signed)
[facility] PROGRESS NOTE  Joseph [name]. WFU:[id number] DOB: 1955-12-25 DOA: 06/11/2021 PCP: Donald Prose, MD  Status: Remains inpatient appropriate because:  Continued need for treatment of left knee wound, continues to have significant difficulty mobilizing secondary to morbid obesity and knee pain/wound; electrolyte abnormalities secondary to recurrent AKI; external fixator in place to LLE  Medically stable for discharge:  NO  Barriers to discharge:  Social:  Clinical: Morbid obesity, current requirement for HD (will be short-term only)  Level of care:  Progressive   Code Status: Full Family Communication: No family at bedside today. DVT prophylaxis: Coumadin/IV heparin bridging COVID vaccination status: Unknown    HPI: 65 y.o. male with PMH significant for severe morbid obesity, OSA on CPAP, DM2, HTN, HLD, PAD, DVT on Coumadin, GERD, anxiety/depression, polyneuropathy, multiple falls who presented to the ED on 06/11/21 after fall at home resulting in left knee pain.  He was found to have left knee dislocation and fracture fragments with associated left knee effusion.  There were concern for possible left knee hematoma in setting of supratherapeutic INR.  Seen by orthopedic surgery, patient underwent closed reduction of left knee dislocation but no surgical management was planned because of patient's body habitus.  Patient is scheduled for a I&D on 07/08/2021 by Dr. Sharol Given.   Subjective: Patient only.  No specific complaints.  Objective: Vitals:   07/27/21 0506 07/27/21 0815  BP: (!) 161/56 (!) 146/52  Pulse: 74 70  Resp: 14 14  Temp: 97.6 F (36.4 C) (!) 97.5 F (36.4 C)  SpO2: 99% 99%    Intake/Output Summary (Last 24 hours) at 07/27/2021 0849 Last data filed at 07/26/2021 1421 Gross per 24 hour  Intake 541.01 ml  Output --  Net 541.01 ml   Filed Weights   07/08/21 0958 07/24/21 1715 07/25/21 0717  Weight: (!) 218 kg (!) 216.5 kg 107.7 kg     Exam:  Constitutional: NAD, calm Respiratory: CTA bilaterally, Normal respiratory effort. No accessory muscle use.  CPAP in place, pulse oximetry 100% Cardiovascular.  S1-S2, sinus rhythm, normotensive.  Bilateral lower extremity edema left somewhat greater than right but both legs with skin changes consistent with very chronic edema.  Weeping of skin. Abdomen: no tenderness,  Bowel sounds positive. LBM 10/29 Musculoskeletal: External fixation device on left lower extremity Neurologic: CN 2-12 grossly intact. Sensation intact, Strength 3/5 x all 4 extremities.  Psychiatric: Normal judgment and insight. Alert and oriented x 3.  Depressed mood.    Assessment/Plan: Acute problems: Left knee dislocation after fall.  Status post left knee reduction external fixation with evacuation Left knee hematoma -X-ray w/ L knee dislocation and several fracture fragments/hematoma.  Orthopedic team initially recommendeded conservative therapy with nonweightbearing -MRI obtained 9/30: significant ligamentous injury/lateral patellar dislocation.  Orthopedics team revisited surgical option and decided they would proceed with surgical repair -s/p L knee extensive debridement, reduction of knee dislocation, external fixation spanning of knee dislocation w/ wound VAC application. --Currently WBAT for therapy -Continue low-dose OxyContin as well as Oxy IR for breakthrough pain; using low-dose Robaxin as well    06/11/2021                  06/18/2021     07/14/2021                 07/14/2021     History of pulmonary embolism/chronic anticoagulation with warfarin Continue warfarin-INR therapeutic   Recurrent AKI on CKD  with recurrent hyperkalemia Baseline creatinine  1.2 w/  GFR >60.   Significant anasarca that has not responded to diuretics Nephrology consulted and patient currently on HD through [facility]-hopeful this will be temporary in nature Max BUN 139 with a creatinine of 2.29 on 10/26 with current  creatinine 3.32 with a BUN of 91 Last HD was on 10/29 with a net UF of 1987 cc All potentially nephrotoxic drugs have been discontinued Remains on Lasix 80 mg IV every 12 hours Discontinue PPI in context of acute renal dysfunction  Chronic peripheral edema See above regarding volume management with HD  Depression /anxiety -Continue Prozac -Would benzodiazepine since patient is very sensitive to this medication and he develops significant drowsiness  Profound physical deconditioning Patient was able to ambulate into the hospital but due to severity of injury requiring surgery as well as orthopedic recommendation for nonweightbearing during the initial portion of his hospitalization his ability to participate in therapies has been limited.  His significant elevation in BMI is also contributing to his ability to recover. 10/17 WBAT -Continue bariatric Kreg tilt bed-this will be most helpful in assisting this gentleman regain mobility.  Increased risk of orthostasis with position changes including changing from supine to seated    Type 2 diabetes mellitus Diabetic neuropathy -A1c 6.1 on 9/22 -Currently on Actos, Semglee 55 units nightly with NovoLog 3 units  TID AC and SSI   Essential hypertension BP is currently at goal Blood pressure has been running suboptimal to maintain adequate renal perfusion so amlodipine discontinued during this hospitalization Lisinopril discontinued secondary to acute renal dysfunction   Hyperlipidemia Pravastatin on hold.   Chronic normocytic anemia Baseline hemoglobin ~10.   S/p of 2 units of PRBCs and 1 unit of FFP.   Current hemoglobin 8.3 Continue Aranesp per nephrology   Transient bradycardia/ 1st degree heart block/frequent PVCs Asymptomatic in context of known sleep apnea but occurs while awake.  EKG significant for 1st degree AV block which is chronic.  Telemetry with heart rate down to high 30s while awake Cardiology plans Zio patch at  discharge.       Severe morbid obesity  -Body mass index is 63.41 kg/m. Patient has been advised to make an attempt to improve diet and exercise patterns to aid in weight loss. PT recommended Kreg bed.   GERD -Continue Protonix-if renal function does not rapidly improve will need to hold until renal function normal   OSA, compliant with CPAP Has been compliant with CPAP, encouraged to continue. Continue CPAP HS and during the day until physical deconditioning improved   Constipation, opiate induced -Continue Colace, Senokot, Miralax -Dulcolax suppository prn   Pressure injury -Mid-nose, not present on admission. Secondary to CPAP mask -Continue foam dressing over bridge of nose     Data Reviewed: Basic Metabolic Panel: Recent Labs  Lab 07/24/21 0019 07/25/21 0454 07/25/21 0546 07/26/21 0630 07/27/21 0320  NA 134* 134* 135 134* 134*  K 4.9 4.9 4.2 4.0 4.2  CL 102 103 101 100 99  CO2 21* 20* 24 25 26   GLUCOSE 152* 109* 118* 134* 150*  BUN 145* 118* 120* 82* 91*  CREATININE 2.69* 2.84* 2.92* 2.81* 3.32*  CALCIUM 9.0 8.3* 8.6* 8.9 8.7*  PHOS 5.2* 4.4 4.6 4.5 5.3*   Liver Function Tests: Recent Labs  Lab 07/24/21 0019 07/25/21 0454 07/25/21 0546 07/26/21 0630 07/27/21 0320  ALBUMIN 2.8* 2.5* 2.6* 2.5* 2.4*    CBC: Recent Labs  Lab 07/22/21 0419 07/23/21 0444 07/25/21 0546 07/26/21 0630 07/27/21 0320  WBC 6.1 7.0 7.0 7.5  7.7  HGB 7.2* 7.2* 7.3* 7.8* 8.3*  HCT 24.8* 25.2* 24.9* 27.5* 28.7*  MCV 92.5 94.0 94.0 93.5 93.8  PLT 470* 471* 498* 528* 461*   CBG: Recent Labs  Lab 07/26/21 0749 07/26/21 1224 07/26/21 1730 07/26/21 2104 07/27/21 0816  GLUCAP 124* 112* 114* 123* 139*      Scheduled Meds:  (feeding supplement) PROSource Plus  30 mL Oral BID BM   vitamin C  1,000 mg Oral Daily   darbepoetin (ARANESP) injection - NON-DIALYSIS  200 mcg Subcutaneous Q Mon-1800   docusate sodium  100 mg Oral Daily   feeding supplement (GLUCERNA SHAKE)   237 mL Oral Q24H   FLUoxetine  60 mg Oral QPM   furosemide  80 mg Intravenous Q12H   influenza vac split quadrivalent PF  0.5 mL Intramuscular Tomorrow-1000   insulin aspart  0-20 Units Subcutaneous TID WC   insulin aspart  0-5 Units Subcutaneous QHS   insulin aspart  3 Units Subcutaneous TID WC   insulin glargine-yfgn  55 Units Subcutaneous QHS   methocarbamol  1,000 mg Oral BID   multivitamin with minerals  1 tablet Oral Daily   nutrition supplement (JUVEN)  1 packet Oral BID BM   oxyCODONE  10 mg Oral Q12H   pantoprazole  40 mg Oral Daily   pioglitazone  45 mg Oral Daily   polyethylene glycol  17 g Oral BID   Ensure Max Protein  11 oz Oral Daily   senna  1 tablet Oral Daily   Continuous Infusions:  sodium chloride 10 mL/hr at 07/13/21 1107   heparin 2,900 Units/hr (07/27/21 0025)    Principal Problem:   Left knee dislocation, initial encounter Active Problems:   Pure hypercholesterolemia   Essential hypertension, benign   OSA (obstructive sleep apnea)   Hx pulmonary embolism   Esophageal reflux   Diabetes mellitus type 2, uncontrolled   Diabetic neuropathy (HCC)   Acute renal failure superimposed on stage 3b chronic kidney disease (HCC)   Normocytic anemia   Class 3 obesity (HCC)   Aortic atherosclerosis (HCC)   Coronary atherosclerosis   Effusion of left knee   Left knee dislocation   Pressure injury of skin   Unstageable pressure ulcer of sacral region ([place])   At risk for hemorrhage associated with anticoagulation therapy   Ulcer of left knee, with necrosis of bone (HCC)   Open knee wound, left, subsequent encounter   Moderate episode of recurrent major depressive disorder [facility])   Consultants: Orthopedics Cardiology  Procedures: Echocardiogram Left knee reduction with external fixation and evacuation of hematoma Left thigh debridement on 10/12 and again on 10/14  Antibiotics: Cephalexin 9/15 through 10/4 Cefazolin 10/5 >>   Time spent: 25  minutes    Erin Hearing ANP  [facility] 7 am - 330 pm/M-F for direct patient care and secure chat Please refer to Amion for contact info 45  days

## 2021-07-27 NOTE — Progress Notes (Signed)
Joseph [name] for heparin  Indication: History of PE/DVT  Allergies  Allergen Reactions   Tape     Peels skin on legs   Chlorhexidine Rash    Wipes    Patient Measurements: Height: 6\' 1"  (185.4 cm) Weight: 107.7 kg (237 lb 6.4 oz) IBW/kg (Calculated) : 79.9 Heparin dosing wt: 98kg  Vital Signs: Temp: 97.5 F (36.4 C) (10/31 0815) Temp Source: Oral (10/31 0815) BP: 146/52 (10/31 0815) Pulse Rate: 70 (10/31 0815)  Labs: Recent Labs    07/25/21 0546 07/25/21 1658 07/26/21 0630 07/27/21 0320  HGB 7.3*  --  7.8* 8.3*  HCT 24.9*  --  27.5* 28.7*  PLT 498*  --  528* 461*  LABPROT 22.8*  --  23.1* 26.0*  INR 2.0*  --  2.0* 2.4*  HEPARINUNFRC  --  <0.10* 0.39 0.38  CREATININE 2.92*  --  2.81* 3.32*     Estimated Creatinine Clearance: 28.9 mL/min (A) (by C-G formula based on SCr of 3.32 mg/dL (H)).   Assessment: 89 YOM on lifelong warfarin therapy for history of PE/DVT, body habitus, and sedentary lifestyle admitted on 9/15 with left knee dislocation and effusion concerning for hematoma. At admission, warfarin was held, vitamin K was given for supratherapeutic INR (6.2), and pt was transfused.   Patient has temporary dialysis catheter in place. Waiting on placement of permacath. Bridging with IV heparin until then.   Heparin level is therapeutic on 2900 units/hr; however, INR trended up to therapeutic level at 2.4.  CBC stable and getting Aranesp every Monday.  No overt bleeding reported.  Goal of Therapy:  Heparin level 0.3-0.7 units/ml Monitor platelets by anticoagulation protocol: Yes   Plan:  Discussed with MD, hold IV heparin with therapeutic INR.  Pharmacy to resume IV heparin once INR < 2. Daily PT / INR  Nitzia Perren D. Mina Marble, PharmD, BCPS, [place] 07/27/2021, 11:12 AM

## 2021-07-27 NOTE — Progress Notes (Signed)
Subjective: Patient states he struggles to get to dialysis because of ongoing possitive pressure needs but no other issues.  Objective Vital signs in last 24 hours: Vitals:   07/27/21 0506 07/27/21 0815 07/27/21 1236 07/27/21 1248  BP: (!) 161/56 (!) 146/52 (!) 130/41 (!) 120/38  Pulse: 74 70 72 69  Resp: 14 14 13    Temp: 97.6 F (36.4 C) (!) 97.5 F (36.4 C) 97.8 F (36.6 C)   TempSrc: Oral Oral Oral   SpO2: 99% 99%    Weight:      Height:       Weight change:   Intake/Output Summary (Last 24 hours) at 07/27/2021 1322 Last data filed at 07/26/2021 1421 Gross per 24 hour  Intake 541.01 ml  Output --  Net 541.01 ml    Assessment/Plan: 65 year old WM with many medical issues admitted after a fall req surgical management of LLE-  hosp complicated by extremely limited mobility-  has developed some A on CRF with BUN out of proprortion to crt-  refractory so it seems edema  1.AKI on CKD:  Baseline seems to be in the 1.5 range. Etiology of AKI unclear.  u/s and another U/A not giving any clues-  seeming like pre renal insult, ? Cardiorenal?   all nephrotoxic meds have been discontinued. Performing dialysis in this gentleman would be very problematic due to his size and his limited mobility and it is doubtful that he would get to a point where he would be appropriate for OP HD logistically -  Dr. Moshe Cipro had discussion with pt and wife they wish to proceed with HD at least in the short term.  He had HD #1 10/28, #2 10/29.  At baseline PTA pt could ambulate in the house-  would go to the MD once a month-  riding in car.  I am still pessimistic about his suitability for OP HD.  Best case scenario is that the AKI resolves and he is liberated from dialysis.  Planning for HD again today. 2. Hypertension/volume  - very volume overloaded-  likely third spacing but also is overloaded-  had been using an albumin/lasix combo to attempt some diuresis but that was ineffective at achieving a negative  balance so required initiation of HD. Continue with UF as tolerated 3. Anemia  - treating with ESA and also IV iron 4. 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, being managed by pharmacy particularly periprocedureally  Reesa Chew    Labs: Basic Metabolic Panel: Recent Labs  Lab 07/25/21 0546 07/26/21 0630 07/27/21 0320  NA 135 134* 134*  K 4.2 4.0 4.2  CL 101 100 99  CO2 24 25 26   GLUCOSE 118* 134* 150*  BUN 120* 82* 91*  CREATININE 2.92* 2.81* 3.32*  CALCIUM 8.6* 8.9 8.7*  PHOS 4.6 4.5 5.3*   Liver Function Tests: Recent Labs  Lab 07/25/21 0546 07/26/21 0630 07/27/21 0320  ALBUMIN 2.6* 2.5* 2.4*   No results for input(s): LIPASE, AMYLASE in the last 168 hours. No results for input(s): AMMONIA in the last 168 hours. CBC: Recent Labs  Lab 07/22/21 0419 07/23/21 0444 07/25/21 0546 07/26/21 0630 07/27/21 0320  WBC 6.1 7.0 7.0 7.5 7.7  HGB 7.2* 7.2* 7.3* 7.8* 8.3*  HCT 24.8* 25.2* 24.9* 27.5* 28.7*  MCV 92.5 94.0 94.0 93.5 93.8  PLT 470* 471* 498* 528* 461*   Cardiac Enzymes: No results for input(s): CKTOTAL, CKMB, CKMBINDEX, TROPONINI in the last 168 hours.  CBG: Recent Labs  Lab 07/26/21  9021 07/26/21 1224 07/26/21 1730 07/26/21 2104 07/27/21 0816  GLUCAP 124* 112* 114* 123* 139*    Iron Studies:  No results for input(s): IRON, TIBC, TRANSFERRIN, FERRITIN in the last 72 hours.  Studies/Results: No results found. Medications: Infusions:  sodium chloride 10 mL/hr at 07/13/21 1107    Scheduled Medications:  (feeding supplement) PROSource Plus  30 mL Oral BID BM   vitamin C  1,000 mg Oral Daily   darbepoetin (ARANESP) injection - NON-DIALYSIS  200 mcg Subcutaneous Q Mon-1800   docusate sodium  100 mg Oral Daily   feeding supplement (GLUCERNA SHAKE)  237 mL Oral Q24H   FLUoxetine  60 mg Oral QPM   furosemide  80 mg Intravenous Q12H   influenza vac split quadrivalent PF  0.5 mL Intramuscular Tomorrow-1000   insulin aspart  0-20 Units Subcutaneous TID  WC   insulin aspart  0-5 Units Subcutaneous QHS   insulin aspart  3 Units Subcutaneous TID WC   insulin glargine-yfgn  55 Units Subcutaneous QHS   methocarbamol  1,000 mg Oral BID   multivitamin with minerals  1 tablet Oral Daily   nutrition supplement (JUVEN)  1 packet Oral BID BM   oxyCODONE  10 mg Oral Q12H   pioglitazone  45 mg Oral Daily   polyethylene glycol  17 g Oral BID   Ensure Max Protein  11 oz Oral Daily   senna  1 tablet Oral Daily    have reviewed scheduled and prn medications.  Physical Exam: General:  morbidly obese- Heart: RRR Lungs: Bilateral chest rise with no increased work of breathing Abdomen: obese-  abd wall edema  Extremities: pitting edema-  external fixator on left Skin: Skin breakdown of the bilateral lower extremities related to chronic venous stasis    07/27/2021,1:22 PM  LOS: 45 days

## 2021-07-27 NOTE — Progress Notes (Signed)
Physical Therapy Treatment Patient Details Name: Joseph [name]. MRN: [id number] DOB: 20-Jan-1956 Today's Date: 07/27/2021   History of Present Illness Joseph [name] is a 65 yo male who presnets with increased LLE swelling after recent fall at home. 06/11/21 pt fell when getting off x-ray table at hospital. X-rays showed a knee dislocation which was confirmed on CT scan as a posterior lateral dislocation; L knee reduced and L KI placed. Lumbar and pelvic xrays negative. 10/5: left knee dislocation reduction with external fixation placement and extensive debridement of hematoma and devitalized skin subtenons tissue muscle fascia Placement of wound VAC; Wound debridement on 10/12. 10/14 L knee and thigh debridement with skin graft, now WBAT L LE.  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    PT Comments    Patient requires encouragement to participate with therapies. MaxA+2-3 for repositioning higher in bed. Patient performed UE and LE exercises with assistance. Worked repositioning R LE in bed to limit hip abduction and ER. Patient reports increased pain with any L LE movement. Patient on CPAP during session. Placed pillow under L pannus to relieve pressure on ex fix. Continue to recommend SNF for ongoing Physical Therapy.       Recommendations for follow up therapy are one component of a multi-disciplinary discharge planning process, led by the attending physician.  Recommendations may be updated based on patient status, additional functional criteria and insurance authorization.  Follow Up Recommendations  Skilled nursing-short term rehab (<3 hours/day)     Assistance Recommended at Discharge [facility] bed;Other (comment) (bariatric bed, bariatric wheelchair, bariatric hoyer lift)    Recommendations for Other Services       Precautions / Restrictions Precautions Precautions:  Fall Precaution Comments: LLE external fixation; anxiety; large pannus - foam above external fixator; MASD under pannus; wound vac; L heel pressure wound Restrictions Weight Bearing Restrictions: Yes LLE Weight Bearing: Weight bearing as tolerated     Mobility  Bed Mobility Overal bed mobility: Needs Assistance Bed Mobility: Rolling Rolling: Max assist;+2 for physical assistance;+2 for safety/equipment         General bed mobility comments: maxA+2 for repositioning    Transfers                        Ambulation/Gait                 Stairs             Wheelchair Mobility    Modified Rankin (Stroke Patients Only)       Balance                                            Cognition Arousal/Alertness: Awake/alert Behavior During Therapy: Flat affect Overall Cognitive Status: Impaired/Different from baseline Area of Impairment: Attention;Memory;Safety/judgement;Awareness;Problem solving                   Current Attention Level: Sustained Memory: Decreased short-term memory   Safety/Judgement: Decreased awareness of deficits Awareness: Emergent Problem Solving: Slow processing General Comments: requires encouragement for participation        Exercises General Exercises - Upper Extremity Shoulder Flexion: AROM;Both;10 reps;Theraband Theraband Level (Shoulder Flexion):  (orange level 2) Elbow Flexion: AROM;Both;10 reps;Theraband Theraband Level (Elbow Flexion):  (orange level 2) Elbow Extension: AROM;Both;10 reps;Theraband Theraband  Level (Elbow Extension):  (orange level 2) General Exercises - Lower Extremity Ankle Circles/Pumps: AROM;Both;10 reps;Supine Quad Sets: AROM;Left;5 reps;Supine Heel Slides: AAROM;Right;5 reps Hip ABduction/ADduction: AAROM;Right;5 reps;Supine Straight Leg Raises: AAROM;Right;5 reps    General Comments        Pertinent Vitals/Pain Pain Assessment: Faces Faces Pain Scale:  Hurts even more Pain Location: LLE Pain Descriptors / Indicators: Grimacing Pain Intervention(s): Monitored during session;Repositioned;Limited activity within patient's tolerance    Home Living                          Prior Function            PT Goals (current goals can now be found in the care plan section) Acute Rehab PT Goals Patient Stated Goal: to help move as much as possible PT Goal Formulation: With patient Time For Goal Achievement: 08/03/21 Potential to Achieve Goals: Fair Progress towards PT goals: Not progressing toward goals - comment    Frequency    Min 3X/week      PT Plan Current plan remains appropriate    Co-evaluation              AM-PAC PT "6 Clicks" Mobility   Outcome Measure  Help needed turning from your back to your side while in a flat bed without using bedrails?: Total Help needed moving from lying on your back to sitting on the side of a flat bed without using bedrails?: Total Help needed moving to and from a bed to a chair (including a wheelchair)?: Total Help needed standing up from a chair using your arms (e.g., wheelchair or bedside chair)?: Total Help needed to walk in hospital room?: Total Help needed climbing 3-5 steps with a railing? : Total 6 Click Score: 6    End of Session Equipment Utilized During Treatment: Oxygen Activity Tolerance: Patient limited by fatigue Patient left: in bed;with call bell/phone within reach Nurse Communication: Mobility status;Need for lift equipment PT Visit Diagnosis: Other abnormalities of gait and mobility (R26.89);Muscle weakness (generalized) (M62.81);History of falling (Z91.81);Repeated falls (R29.6);Pain Pain - Right/Left: Left Pain - part of body: Knee;Leg     Time: 4650-3546 PT Time Calculation (min) (ACUTE ONLY): 28 min  Charges:  $Therapeutic Exercise: 23-37 mins                     Xai Frerking A. Gilford Rile PT, DPT Acute Rehabilitation Services Pager  [phone] Office [phone]    Joseph [name] 07/27/2021, 10:32 AM

## 2021-07-28 ENCOUNTER — Ambulatory Visit: Payer: HMO | Admitting: Internal Medicine

## 2021-07-28 DIAGNOSIS — L89150 Pressure ulcer of sacral region, unstageable: Secondary | ICD-10-CM

## 2021-07-28 LAB — RENAL FUNCTION PANEL
Albumin: 2.3 g/dL — ABNORMAL LOW (ref 3.5–5.0)
Anion gap: 6 (ref 5–15)
BUN: 57 mg/dL — ABNORMAL HIGH (ref 8–23)
CO2: 29 mmol/L (ref 22–32)
Calcium: 8.8 mg/dL — ABNORMAL LOW (ref 8.9–10.3)
Chloride: 100 mmol/L (ref 98–111)
Creatinine, Ser: 2.7 mg/dL — ABNORMAL HIGH (ref 0.61–1.24)
GFR, Estimated: 26 mL/min — ABNORMAL LOW (ref >60)
Glucose, Bld: 93 mg/dL (ref 70–99)
Phosphorus: 3.8 mg/dL (ref 2.5–4.6)
Potassium: 3.6 mmol/L (ref 3.5–5.1)
Sodium: 135 mmol/L (ref 135–145)

## 2021-07-28 LAB — GLUCOSE, CAPILLARY
Glucose-Capillary: 77 mg/dL (ref 70–99)
Glucose-Capillary: 141 mg/dL — ABNORMAL HIGH (ref 70–99)
Glucose-Capillary: 117 mg/dL — ABNORMAL HIGH (ref 70–99)
Glucose-Capillary: 106 mg/dL — ABNORMAL HIGH (ref 70–99)

## 2021-07-28 LAB — HEPATITIS B SURFACE ANTIBODY, QUANTITATIVE: Hep B S AB Quant (Post): <3.1 m[IU]/mL — ABNORMAL LOW (ref >9.9)

## 2021-07-28 LAB — PROTIME-INR
INR: 2.3 — ABNORMAL HIGH (ref 0.8–1.2)
Prothrombin Time: 24.9 seconds — ABNORMAL HIGH (ref 11.4–15.2)

## 2021-07-28 MED ORDER — PROSOURCE PLUS PO LIQD
30.0000 mL | Freq: Three times a day (TID) | ORAL | Status: DC
  Administered 2021-07-28 – 2021-07-29 (×3): 30 mL via ORAL
  Filled 2021-07-28 (×3): qty 30

## 2021-07-28 MED ORDER — OXYCODONE HCL ER 15 MG PO T12A
15.0000 mg | EXTENDED_RELEASE_TABLET | Freq: Two times a day (BID) | ORAL | Status: DC
  Administered 2021-07-28 – 2021-07-31 (×6): 15 mg via ORAL
  Filled 2021-07-28 (×6): qty 1

## 2021-07-28 MED ORDER — GLUCERNA SHAKE PO LIQD
237.0000 mL | Freq: Two times a day (BID) | ORAL | Status: DC
  Filled 2021-07-28 (×2): qty 237

## 2021-07-28 NOTE — Progress Notes (Signed)
PROGRESS NOTE    Joseph [name].  WUJ:[id number] DOB: [birth year] DOA: 06/11/2021 PCP: Donald Prose, MD    Brief Narrative:  65 year old gentleman with history of super morbid obesity, history of sleep apnea on CPAP, type 2 diabetes on insulin, hypertension, hyperlipidemia, peripheral artery disease, DVT on Coumadin, GERD, anxiety and depression and multiple falls presented to the emergency room on 9/15 after a fall at home resulting in left knee pain.  He was found to have left knee dislocation and fracture fragments with left knee effusion.  Underwent close reduction in the ER and admitted to the hospital.  9/15, admitted with close reduction but continues pain. 9/30, MRI with significant ligamentous injury and lateral patellar dislocation.  Left knee extensive debridement, reduction of dislocation, external fixation with wound VAC application for chronic left thigh wound. On CPAP for sleep apnea Also received 2 units of PRBC and 1 unit of FFP for supratherapeutic INR during hospital course. 10/22, noted shortness of breath on talking.  Hospitalization prolonged with severe physical deconditioning and remains with very poor respiratory status.  Nephrology consulted. 10/28, decided to start on hemodialysis due to persistent fluid retention and decreased urine output. Started on hemodialysis with temporary dialysis catheter. 11/1, no significant improvement on renal functions, patient stated to his suffering and exploring options not to do hemodialysis.  Encouraged discussion with palliative care.  Optimizing pain medications.   Assessment & Plan:   Principal Problem:   Left knee dislocation, initial encounter Active Problems:   Pure hypercholesterolemia   Essential hypertension, benign   OSA (obstructive sleep apnea)   Hx pulmonary embolism   Esophageal reflux   Diabetes mellitus type 2, uncontrolled   Diabetic neuropathy (HCC)   Acute renal failure superimposed on stage 3b chronic  kidney disease (HCC)   Normocytic anemia   Class 3 obesity (HCC)   Aortic atherosclerosis (HCC)   Coronary atherosclerosis   Effusion of left knee   Left knee dislocation   Pressure injury of skin   Unstageable pressure ulcer of sacral region ([place])   At risk for hemorrhage associated with anticoagulation therapy   Ulcer of left knee, with necrosis of bone ([place])   Open knee wound, left, subsequent encounter   Moderate episode of recurrent major depressive disorder ([place])  Fall, left knee hematoma and dislocation: Complicated hospitalization. Surgical reduction with external fixation, hematoma evacuation and skin grafting by Dr. Sharol Given. Skin graft failed, now has open wound and receiving daily dressing.  Followed by surgery. Mobility has been challenging. Weightbearing as tolerated if he can but this is not happening. Adequate pain medications that has been challenging. Change to oxycodone IR 15 mg twice daily today with aim to increase the long acting oxycodone. Continue PRN oxy and dilaudid iv for short term pain relief for dressing change and mobility. also on Robaxin.  Still using IV pain medications for intermittent pain control. Difficulty healing his left thigh wound due to immobility and multiple medical factors.  Anasarca and pulmonary edema and acute renal failure: secondary to severe hypoalbuminemia and fluid overload.  treated with IV Lasix with no significant results. Significant leg edema and scrotal edema.  No significant urine output. Patient could not fit on the radiology table to insert a permacath, a temporary dialysis catheter was placed and received hemodialysis 10/28, 10/29 and 10/31. See goal of care discussion below.  Acute on chronic hypoxemic and hypercarbic respiratory failure, obstructive sleep apnea:  Using CPAP at night.  Uses CPAP most of the time  with breaks for comfort. Intermittently gets shortness of breath and unable to maintain on CPAP, uses  BiPAP.  Acute metabolic encephalopathy: Resolved.  Avoid Ativan.  Type 2 diabetes: Controlled.  On Lantus and Premeal insulin.  Also on Actos.  Essential hypertension: Blood pressure stable.  History of DVT/PE: Coumadin on hold for procedure.  Has a history of DVT and PE and very high risk of developing another thromboembolic event due to morbid obesity and bedbound status.  Will bridge with heparin until patient gets a permacath. INR is 2.3.  Heparin on hold.  Anxiety disorder: On Prozac every morning and hydroxyzine as needed.  Ativan discontinued.  Severe morbid obesity: BMI 64.  Anemia of chronic disease: Hemoglobin stable today.  Physical deconditioning anxiety and lethargy limiting his ability to participate in PT. Poor recovery due to severe morbid obesity, nonhealing wounds, pain and unable to come off the CPAP around-the-clock.  Goal of care discussion: 11/1.  Morning rounds I met with patient and his wife at the bedside along with Dr. Joylene Grapes from nephrology.  Patient had multiple complaints mostly related to difficulties with hemodialysis, transportations, movements, pain.  Wife had multiple issues regarding on time transfer, on time pain medications uses. They also had genuine question about whether patient will have any renal recovery which was answered by nephrologist.  Given his overall comorbidities, renal recovery is slim and options of rehab are also getting slimmer now.  Patient understands this. Today, patient is asking [place] to stop hemodialysis.  He told me later on that he does not want to do dialysis.  We also discussed that if he does not get dialysis and he does not make urine, he will focus on comfort care and may pass away.  Patient understands this. He is having difficulty making decisions.  He is more awake and alert and able to participate in conversation. Palliative care has been following, mostly talking to the family.  Patient himself wants to talk to them. I  will optimize pain control. I have called palliative care team to come on board and follow-up with him.  If he chooses, comfort care and hospice is appropriate options for him.   DVT prophylaxis:   INR more than 2.   Code Status: Full code. Family Communication: Wife at the bedside. Disposition Plan: Status is: Inpatient  Remains inpatient appropriate because: Around-the-clock CPAP. unsafe discharge because of active pain management, IV pain medications.  Renal function recovery.       Consultants:  Orthopedics Pulmonary critical care Nephrology Palliative care  Procedures:  Multiple procedures applicable  Antimicrobials:  Completed multiple antibiotics.   Subjective:  Patient seen and examined.  Extensive discussion by patient about dialysis, recovery and his kidney functions.  He was able to talk without CPAP mask for few minutes then he had to put it back on.  He tells me that he is tired now and considering to stop doing all these things.  He is considering to stop doing hemodialysis.  I have encouraged for him to take his time, discussed with family about his goals and decide.  Objective: Vitals:   07/28/21 0327 07/28/21 0535 07/28/21 0734 07/28/21 1122  BP: (!) 150/41 (!) 147/39 (!) 143/33 (!) 135/37  Pulse: 74 76 73 79  Resp: 14 20 14 20   Temp: 98.3 F (36.8 C) 98.8 F (37.1 C) 98.4 F (36.9 C) 98.4 F (36.9 C)  TempSrc: Oral Axillary Axillary Axillary  SpO2: 100% 100% 99% 90%  Weight:  Height:        Intake/Output Summary (Last 24 hours) at 07/28/2021 1331 Last data filed at 07/28/2021 1300 Gross per 24 hour  Intake 200 ml  Output 4206 ml  Net -4006 ml   Filed Weights   07/25/21 0717 07/27/21 1236 07/27/21 1625  Weight: 107.7 kg (!) 228 kg (!) 224.9 kg    Examination:  General exam: Chronically sick looking gentleman lying in bed on CPAP.  Occasionally takes off CPAP to talk but he had to go back on it quickly. Not in any distress but  anxious and emotional. Respiratory system: Difficult to auscultate.  No added sounds. Cardiovascular system: S1 & S2 heard, RRR.  Right IJ hemodialysis catheter present. Gastrointestinal system: Soft.  Obese and pendulous.  Bowel sounds present.  Nontender.   Central nervous system: Alert and oriented. No focal neurological deficits.  Generalized weakness. Extremities:  Right lower extremity with chronic venous stasis changes and pigmentation, tense edema. Weeping from the leg wounds. Left lower extremity with big open wound anterior thigh with serosanguineous discharge, external fixator in place.  Weeping leg wounds.  Scrotal edema present.  Bilateral tense dorsum edema present.    Data Reviewed: I have personally reviewed following labs and imaging studies  CBC: Recent Labs  Lab 07/22/21 0419 07/23/21 0444 07/25/21 0546 07/26/21 0630 07/27/21 0320  WBC 6.1 7.0 7.0 7.5 7.7  HGB 7.2* 7.2* 7.3* 7.8* 8.3*  HCT 24.8* 25.2* 24.9* 27.5* 28.7*  MCV 92.5 94.0 94.0 93.5 93.8  PLT 470* 471* 498* 528* 009*   Basic Metabolic Panel: Recent Labs  Lab 07/25/21 0454 07/25/21 0546 07/26/21 0630 07/27/21 0320 07/28/21 0624  NA 134* 135 134* 134* 135  K 4.9 4.2 4.0 4.2 3.6  CL 103 101 100 99 100  CO2 20* 24 25 26 29   GLUCOSE 109* 118* 134* 150* 93  BUN 118* 120* 82* 91* 57*  CREATININE 2.84* 2.92* 2.81* 3.32* 2.70*  CALCIUM 8.3* 8.6* 8.9 8.7* 8.8*  PHOS 4.4 4.6 4.5 5.3* 3.8   GFR: Estimated Creatinine Clearance: 53.9 mL/min (A) (by C-G formula based on SCr of 2.7 mg/dL (H)). Liver Function Tests: Recent Labs  Lab 07/25/21 0454 07/25/21 0546 07/26/21 0630 07/27/21 0320 07/28/21 0624  ALBUMIN 2.5* 2.6* 2.5* 2.4* 2.3*   No results for input(s): LIPASE, AMYLASE in the last 168 hours. No results for input(s): AMMONIA in the last 168 hours. Coagulation Profile: Recent Labs  Lab 07/24/21 0019 07/25/21 0546 07/26/21 0630 07/27/21 0320 07/28/21 0624  INR 1.5* 2.0* 2.0* 2.4*  2.3*   Cardiac Enzymes: No results for input(s): CKTOTAL, CKMB, CKMBINDEX, TROPONINI in the last 168 hours.  BNP (last 3 results) No results for input(s): PROBNP in the last 8760 hours. HbA1C: No results for input(s): HGBA1C in the last 72 hours. CBG: Recent Labs  Lab 07/27/21 0816 07/27/21 1725 07/27/21 2148 07/28/21 0750 07/28/21 1216  GLUCAP 139* 79 155* 77 141*   Lipid Profile: No results for input(s): CHOL, HDL, LDLCALC, TRIG, CHOLHDL, LDLDIRECT in the last 72 hours. Thyroid Function Tests: No results for input(s): TSH, T4TOTAL, FREET4, T3FREE, THYROIDAB in the last 72 hours. Anemia Panel: No results for input(s): VITAMINB12, FOLATE, FERRITIN, TIBC, IRON, RETICCTPCT in the last 72 hours.  Sepsis Labs: No results for input(s): PROCALCITON, LATICACIDVEN in the last 168 hours.  No results found for this or any previous visit (from the past 240 hour(s)).       Radiology Studies: No results found.  Scheduled Meds:  (feeding supplement) PROSource Plus  30 mL Oral TID BM   vitamin C  1,000 mg Oral Daily   darbepoetin (ARANESP) injection - NON-DIALYSIS  200 mcg Subcutaneous Q Mon-1800   docusate sodium  100 mg Oral Daily   feeding supplement (GLUCERNA SHAKE)  237 mL Oral BID BM   FLUoxetine  60 mg Oral QPM   furosemide  80 mg Intravenous Q12H   influenza vac split quadrivalent PF  0.5 mL Intramuscular Tomorrow-1000   insulin aspart  0-20 Units Subcutaneous TID WC   insulin aspart  0-5 Units Subcutaneous QHS   insulin aspart  3 Units Subcutaneous TID WC   insulin glargine-yfgn  55 Units Subcutaneous QHS   methocarbamol  1,000 mg Oral BID   multivitamin with minerals  1 tablet Oral Daily   oxyCODONE  15 mg Oral Q12H   polyethylene glycol  17 g Oral BID   Ensure Max Protein  11 oz Oral Daily   senna  1 tablet Oral Daily   Continuous Infusions:  sodium chloride 10 mL/hr at 07/13/21 1107     LOS: 46 days    Time spent: 30 minutes    Barb Merino,  MD [facility] Pager [phone]

## 2021-07-28 NOTE — Progress Notes (Signed)
Palliative Medicine Inpatient Follow Up Note   HPI: Palliative Care consult requested on hospital day 42 for symptom management and goals of care discussion in this 65 y.o. male  with past medical history of morbid obesity, falls, sleep apnea (CPAP), type 2 diabetes, hyperlipidemia, PAD, hypertension, DVT (Coumadin), anxiety, depression, and GERD.  He was admitted on 06/11/2021 from home with s/p fall resulting in knee dislocation.  Hospital course complicated by closed reduction of left knee dislocation, significant ligamentous injury and lateral patellar dislocation s/p extensive debridement, reduction of dislocation, external fixation with wound VAC to chronic left thigh wound, respiratory failure, acute on chronic kidney failure requiring short-term dialysis.  Today's Discussion (07/28/2021):  *Please note that this is a verbal dictation therefore any spelling or grammatical errors are due to the "[employer]" system interpretation.  Chart reviewed. Received message that patient would like to discuss goc further today. Assessed patient at the bedside. He is resting with his CPAP in place. We briefly discussed his thoughts on discontinuing dialysis and he requests that I return after 4pm to review comfort care with his wife Eustaquio Maize present.  I then returned to the bedside as requested. Emotional support and therapeutic listening was provided as Joseph [name] shared his thoughts on his poor quality of life for the past several months, with no improvement since starting dialysis. He finds it difficult to hear the "moaning and groaning" from other patients and envision a life dependent on this. Transportation remains a concern as well. We reviewed his lack of significant improvement that would indicate he could be liberated from dialysis. Counseled on the expected prognosis in patients who elect to withdraw dialysis. Joseph [name] is at peace with this and understands the unpredictable nature of the end of life  trajectory.   Counseled on the appropriateness of DNR given patient's shift in goals of care to a comfort-focused approach. Reviewed inpatient comfort care in detail, hospice philosophy, and outpatient hospice resources available including at SNF, home, and residential hospice depending on eligibility. Joseph [name] would prefer to be home but this may be difficult as Eustaquio Maize is the main caregiver and hospice support may not be sufficient. Discussed the option of arranging private caregivers. They live close to [facility] and would also consider this option. Joseph [name] is quite tired as this time and agreed to DNR and discontinuation of dialysis. He would like to revisit the conversation tomorrow before deciding on a transition to full comfort care and referral to hospice.  Questions and concerns addressed. PMT will continue to support holistically.   Objective Assessment: Vital Signs Vitals:   07/28/21 0734 07/28/21 1122  BP: (!) 143/33 (!) 135/37  Pulse: 73 79  Resp: 14 20  Temp: 98.4 F (36.9 C) 98.4 F (36.9 C)  SpO2: 99% 90%    Intake/Output Summary (Last 24 hours) at 07/28/2021 1221 Last data filed at 07/28/2021 1013 Gross per 24 hour  Intake 200 ml  Output 4006 ml  Net -3806 ml    Last Weight  Most recent update: 07/27/2021 11:42 PM    Weight  224.9 kg (495 lb 13 oz)              Physical Exam General: NAD, morbidly obese chronically-ill appearing Cardiovascular: regular rate and rhythm  Pulmonary: CPAP in place, normal breathing pattern Abdomen: soft, nontender, + bowel sounds Extremities: Anasarca, bilateral lower extremity pitting edema Skin: no rashes, warm and dry, ulceration to nose bridge Neurological: AAOx3, mood appropriate  SUMMARY OF RECOMMENDATIONS  DNR Patient has decided to discontinue dialysis, he will likely proceed with transition to full comfort care tomorrow Psychosocial and emotional support provided Ongoing support from PMT   Time In: 1:15pm Time Out:  1:30pm Time In: 4:00pm Time Out: 5:15pm Time Spent: 90 minutes Prolonged time: Yes Greater than 50% of the time was spent in counseling and coordination of care  Joseph [name] Burt Knack, [place] Team Team Cell Phone: [phone] Please utilize secure chat with additional questions, if there is no response within 30 minutes please call the above phone number  Palliative Medicine Team providers are available by phone from 7am to 7pm daily and can be reached through the team cell phone.  Should this patient require assistance outside of these hours, please call the patient's attending physician.

## 2021-07-28 NOTE — Progress Notes (Signed)
[facility] for heparin  Indication: History of PE/DVT  Allergies  Allergen Reactions   Tape     Peels skin on legs   Chlorhexidine Rash    Wipes    Patient Measurements: Height: 6\' 1"  (185.4 cm) Weight: (!) 224.9 kg (495 lb 13 oz) IBW/kg (Calculated) : 79.9 Heparin dosing wt: 98kg  Vital Signs: Temp: 98.4 F (36.9 C) (11/01 1122) Temp Source: Axillary (11/01 1122) BP: 135/37 (11/01 1122) Pulse Rate: 79 (11/01 1122)  Labs: Recent Labs    07/25/21 1658 07/26/21 0630 07/27/21 0320 07/28/21 0624  HGB  --  7.8* 8.3*  --   HCT  --  27.5* 28.7*  --   PLT  --  528* 461*  --   LABPROT  --  23.1* 26.0* 24.9*  INR  --  2.0* 2.4* 2.3*  HEPARINUNFRC <0.10* 0.39 0.38  --   CREATININE  --  2.81* 3.32* 2.70*     Estimated Creatinine Clearance: 53.9 mL/min (A) (by C-G formula based on SCr of 2.7 mg/dL (H)).   Assessment: 14 YOM on lifelong warfarin therapy for history of PE/DVT, body habitus, and sedentary lifestyle admitted on 9/15 with left knee dislocation and effusion concerning for hematoma. At admission, warfarin was held, vitamin K was given for supratherapeutic INR (6.2), and pt was transfused.   Patient has temporary dialysis catheter in place. Waiting on placement of permacath. Bridging with IV heparin until then.   Heparin level is therapeutic on 2900 units/hr; however, INR trended up to therapeutic level, remains stable today at 2.3. CBC stable and getting Aranesp every Monday. No overt bleeding reported.  Goal of Therapy:  Heparin level 0.3-0.7 units/ml Monitor platelets by anticoagulation protocol: Yes   Plan:  Discussed with MD, hold IV heparin with therapeutic INR. Pharmacy to resume IV heparin once INR < 2. Daily PT / INR  Arturo Morton, PharmD, BCPS Please check AMION for all [place] contact numbers Clinical Pharmacist 07/28/2021 1:48 PM

## 2021-07-28 NOTE — Progress Notes (Signed)
Nutrition Follow-up  DOCUMENTATION CODES:   Morbid obesity  INTERVENTION:   - Increase ProSource Plus 30 ml po to TID, each supplement provides 100 kcal and 15 grams of protein   - Continue Ensure Max po daily, each supplement provides 150 kcal and 30 grams of protein   - Increase Glucerna Shake po to BID, each supplement provides 220 kcal and 10 grams of protein   - d/c Juven as pt does not like this   - Continue MVI with minerals daily  NUTRITION DIAGNOSIS:   Inadequate oral intake related to decreased appetite as evidenced by per patient/family report.  Progressing, being addressed via oral nutrition supplements  GOAL:   Patient will meet greater than or equal to 90% of their needs  Progressing  MONITOR:   PO intake, Supplement acceptance, Labs, Weight trends, Skin  REASON FOR ASSESSMENT:   Consult Assessment of nutrition requirement/status  ASSESSMENT:   65 y.o. male with medical history of morbid obesity, anxiety, depression, hx L knee DVT, PE (on chronic Coumadin), GERD, HTN, IDA, cervical spine stenosis, non-specific myalgias/myositis, OSA on CPAP, PVD, polyneuropathy, type 2 DM, HLD, and multiple falls. He presented to the ED due to a fall on 9/15. He was admitted for pain control and to monitor L leg for development of compartment syndrome.  10/06 - s/p L knee extensive debridement, reduction of knee dislocation, external fixation spanning of knee dislocation, and application of wound VAC 10/12 - s/p L thigh debridement, application of wound VAC 10/14 - s/p L knee and thigh debridement, application of skin graft, application of wound VAC 10/28 - s/p temporary HD catheter placement  Pt now requiring HD. Last HD was yesterday with 4006 ml net UF. Post-HD weight was 224.9 kg. Pt with deep pitting edema to BLE. Unsure of pt's true dry weight at this time.  No meal completions charted since 10/18.  Spoke with pt and wife at bedside. Pt enjoys Glucerna and  Ensure Max oral nutrition supplements but does not like Juven. RD to d/c these. Pt taking ProSource Plus. Will increase from BID to TID. Pt not eating much at meals, maybe 25-50%. Per wife, pt consumed all of toast, some of grits, and some of sausage this morning. RD encouraged PO intake to promote wound healing and preserve lean muscle mass.  Medications reviewed and include: ProSource Plus BID, vitamin C 1000 mg daily, aranesp weekly, colace, Glucerna Shake daily, IV lasix, SSI, novolog 3 units TID with meals, semglee 55 units daily, MVI with minerals, Juven BID, actos, miralax, Ensure max daily, senna  Labs reviewed: BUN 57, creatinine 2.70, hemoglobin 8.3 CBG's: 77-155 x 24 hours  Diet Order:   Diet Order             Diet Carb Modified Fluid consistency: Thin; Room service appropriate? Yes  Diet effective now                   EDUCATION NEEDS:   No education needs have been identified at this time  Skin:  Skin Assessment:  Skin Integrity Issues: Stage II: nose Incisions: L leg Other: venous stasis ulcers to R and L pretibial areas; open blister to L anterior thigh; skin tear to R buttock  Last BM:  07/25/21  Height:   Ht Readings from Last 1 Encounters:  07/08/21 6\' 1"  (1.854 m)    Weight:   Wt Readings from Last 1 Encounters:  07/27/21 (!) 224.9 kg    Ideal Body Weight:  83.6  kg  BMI:  Body mass index is 65.41 kg/m.  Estimated Nutritional Needs:   Kcal:  2300-2500 kcal  Protein:  115-135 grams  Fluid:  >/= 2.4 L/day    Gustavus Bryant, MS, RD, LDN Inpatient Clinical Dietitian Please see AMiON for contact information.

## 2021-07-28 NOTE — Progress Notes (Signed)
Subjective: Patient continues to be exasperated by difficulties with transport/dialysis/logistics. It is very hard and sometimes painful to go through the process of getting to and from dialysis. He is having weeping from his right leg. No obvious significant urine output  We discussed that in the hospital we are limited on options to make going to and from dialysis easier. We do not have enough nurses to do in room hemodialysis unfortunately unless it is an emergency. We do not see obvious signs of renal recovery and his changes of recovery are difficult to predict.  The patient expressed some desire to stop doing dialysis today because of how hard it is on him. We discussed that this is not unreasonable and we are here to support him in whatever he chooses. I would suggest focusing on comfort if the decision for now dialysis was made.  Objective Vital signs in last 24 hours: Vitals:   07/28/21 0327 07/28/21 0535 07/28/21 0734 07/28/21 1122  BP: (!) 150/41 (!) 147/39 (!) 143/33 (!) 135/37  Pulse: 74 76 73 79  Resp: 14 20 14 20   Temp: 98.3 F (36.8 C) 98.8 F (37.1 C) 98.4 F (36.9 C) 98.4 F (36.9 C)  TempSrc: Oral Axillary Axillary Axillary  SpO2: 100% 100% 99% 90%  Weight:      Height:       Weight change:   Intake/Output Summary (Last 24 hours) at 07/28/2021 1216 Last data filed at 07/28/2021 1013 Gross per 24 hour  Intake 200 ml  Output 4006 ml  Net -3806 ml    Assessment/Plan: 65 year old WM with many medical issues admitted after a fall req surgical management of LLE-  hosp complicated by extremely limited mobility-  has developed some A on CRF with BUN out of proprortion to crt-  refractory so it seems edema  1.AKI on CKD:  Baseline seems to be in the 1.5 range. Etiology of AKI unclear.  u/s and another U/A not giving any clues-  seeming like cardiorenal injury with possible intermittent ischemic damage.   all nephrotoxic meds have been discontinued. Performing dialysis in  this gentleman would be very problematic due to his size and his limited mobility and it is doubtful that he would get to a point where he would be appropriate for OP HD logistically -  Dr. Moshe Cipro had discussion with pt and wife in the past and decision was made to try dialysis.  He had HD #1 10/28, #2 10/29.  At baseline PTA pt could ambulate in the house-  would go to the MD once a month-  riding in car.  I am still pessimistic about his suitability for OP HD.  Best case scenario is that the AKI resolves and he is liberated from dialysis. Patient is considering stopping HD at this time which is reasonable. Will f/u his discussion with his wife. 2. Hypertension/volume  - very volume overloaded-  likely third spacing but also is overloaded-  had been using an albumin/lasix combo to attempt some diuresis but that was ineffective at achieving a negative balance so required initiation of HD. Continue with UF as tolerated 3. Anemia  - treating with ESA and also IV iron 4. 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 No results for input(s): AMMONIA in the last 168 hours. CBC: Recent Labs  Lab 07/22/21 0419 07/23/21 0444 07/25/21 0546 07/26/21 0630 07/27/21 0320  WBC 6.1 7.0 7.0 7.5 7.7  HGB 7.2* 7.2* 7.3* 7.8* 8.3*  HCT 24.8* 25.2* 24.9* 27.5* 28.7*  MCV 92.5 94.0 94.0 93.5 93.8  PLT 470* 471* 498* 528* 461*   Cardiac Enzymes: No results for input(s): CKTOTAL, CKMB, CKMBINDEX,  TROPONINI in the last 168 hours.  CBG: Recent Labs  Lab 07/26/21 2104 07/27/21 0816 07/27/21 1725 07/27/21 2148 07/28/21 0750  GLUCAP 123* 139* 79 155* 77    Iron Studies:  No results for input(s): IRON, TIBC, TRANSFERRIN, FERRITIN in the last 72 hours.  Studies/Results: No results found. Medications: Infusions:  sodium chloride 10 mL/hr at 07/13/21 1107    Scheduled Medications:  (feeding supplement) PROSource Plus  30 mL Oral TID BM   vitamin C  1,000 mg Oral Daily   darbepoetin (ARANESP) injection - NON-DIALYSIS  200 mcg Subcutaneous Q Mon-1800   docusate sodium  100 mg Oral Daily   feeding supplement (GLUCERNA SHAKE)  237 mL Oral BID BM   FLUoxetine  60 mg Oral QPM   furosemide  80 mg Intravenous Q12H   influenza vac split quadrivalent PF  0.5 mL Intramuscular Tomorrow-1000   insulin aspart  0-20 Units Subcutaneous TID WC   insulin aspart  0-5 Units Subcutaneous QHS   insulin aspart  3 Units Subcutaneous TID WC   insulin glargine-yfgn  55 Units Subcutaneous QHS   methocarbamol  1,000 mg Oral BID   multivitamin with minerals  1 tablet Oral Daily   oxyCODONE  15 mg Oral Q12H   polyethylene glycol  17 g Oral BID   Ensure Max Protein  11 oz Oral Daily   senna  1 tablet Oral Daily    have reviewed scheduled and prn medications.  Physical Exam: General:  morbidly obese, lying in bed Heart: normal rate Lungs: Bilateral chest rise with mild increased work of breathing on nasal bipap Abdomen: obese-  abd wall edema  Extremities: pitting edema-  external fixator on left Skin: Skin breakdown of the bilateral lower extremities related to chronic venous stasis    07/28/2021,12:16 PM  LOS: 46 days

## 2021-07-28 NOTE — Progress Notes (Signed)
Occupational Therapy Treatment Patient Details Name: Joseph [name]. MRN: [id number] DOB: 1956-07-09 Today's Date: 07/28/2021   History of present illness Joseph [name] is a 65 yo male who presnets with increased LLE swelling after recent fall at home. 06/11/21 pt fell when getting off x-ray table at hospital. X-rays showed a knee dislocation which was confirmed on CT scan as a posterior lateral dislocation; L knee reduced and L KI placed. Lumbar and pelvic xrays negative. 10/5: left knee dislocation reduction with external fixation placement and extensive debridement of hematoma and devitalized skin subtenons tissue muscle fascia Placement of wound VAC; Wound debridement on 10/12. 10/14 L knee and thigh debridement with skin graft, now WBAT L LE.  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   OT comments  Pt premedicated. Discussion at beginning of session regarding pt participating with therapy. Pt states he had a discussion with nephrology about whether to continue with dialysis and what implications that would have on his quality of life. Emphasized that therapy is vital in his recovery unless he chooses to go comfort care. Pt verbalized understanding. Pt agreeable to tilt and therapeutic activity and able to maintain tilt @ 30 degrees with VSS while using CPAP. Completed bed level exercises with wife assisting. Significant improvement in participation today. Unstageable dark intact nonblanchable pressure area lateral aspect of L heel. Recommend Prevalon boot for R foot as well. Will continue to follow acutely.    Recommendations for follow up therapy are one component of a multi-disciplinary discharge planning process, led by the attending physician.  Recommendations may be updated based on patient status, additional functional criteria and insurance authorization.    Follow Up Recommendations  Skilled nursing-short term rehab (<3 hours/day)    Assistance  Recommended at Discharge    Equipment Recommendations  [facility]    Recommendations for Other Services      Precautions / Restrictions Precautions Precautions: Fall Precaution Comments: LLE external fixation; anxiety; large pannus - foam above external fixator; MASD under pannus; wound vac; L heel pressure wound Restrictions Weight Bearing Restrictions: Yes LLE Weight Bearing: Weight bearing as tolerated       Mobility Bed Mobility Overal bed mobility: Needs Assistance             General bed mobility comments: Trendelenburg feature used with pt pulling up on top hand roils to reposition; pt pulling/pushing on B hand rails    Transfers                   General transfer comment: Pt tilted to 30 degrees adn staying @ 30 for @ 10 minutes     Balance                                           ADL either performed or assessed with clinical judgement   ADL           Upper Body Bathing: Minimal assistance;Bed level                                   Vision       Perception     Praxis      Cognition Arousal/Alertness: Awake/alert Behavior During Therapy: Flat affect Overall Cognitive Status: Impaired/Different from baseline Area of Impairment: Attention;Memory;Safety/judgement;Awareness;Problem solving  Current Attention Level: Sustained Memory: Decreased short-term memory   Safety/Judgement: Decreased awareness of safety;Decreased awareness of deficits Awareness: Emergent Problem Solving: Slow processing General Comments: improved cognition and participation          Exercises General Exercises - Lower Extremity Heel Slides: AAROM;Right;5 reps Straight Leg Raises: AAROM;Right;5 reps Other Exercises Other Exercises: pillow used to slide/abduct RLE off bed; flex knee/knee extension , then slide backonto bed Other Exercises: minisquats with RLE shen standing @ 30 degrees Other Exercises:  Crossing midlein to reach cross body x 10   Shoulder Instructions       General Comments VSS; parital session completed off CPAP    Pertinent Vitals/ Pain       Pain Assessment: 0-10 Pain Score: 5  Breathing: normal Negative Vocalization: none Facial Expression: sad, frightened, frown Pain Location: LLE Pain Descriptors / Indicators: Discomfort;Grimacing Pain Intervention(s): Limited activity within patient's tolerance;Premedicated before session  Home Living                                          Prior Functioning/Environment              Frequency  Min 2X/week        Progress Toward Goals  OT Goals(current goals can now be found in the care plan section)  Progress towards OT goals: Progressing toward goals  Acute Rehab OT Goals OT Goal Formulation: With patient/family Time For Goal Achievement: 07/30/21 Potential to Achieve Goals: Fair ADL Goals Pt Will Perform Upper Body Bathing: bed level Pt/caregiver will Perform Home Exercise Program: Increased strength;Both right and left upper extremity;With theraband Additional ADL Goal #1: Ptwill roll R/L with 1 person min A in preparation for ADL tasks Additional ADL Goal #2: Pt will tolerate standing in Tilt bed x 15 min at 45 degrees to improve stadning tolerance for ADL andmoblity Additional ADL Goal #3: Pt will verbalize 2 strategies to reduce risk of MASD under pannus  Plan Discharge plan remains appropriate    Co-evaluation                 AM-PAC OT "6 Clicks" Daily Activity     Outcome Measure   Help from another person eating meals?: None Help from another person taking care of personal grooming?: A Little Help from another person toileting, which includes using toliet, bedpan, or urinal?: Total Help from another person bathing (including washing, rinsing, drying)?: A Lot Help from another person to put on and taking off regular upper body clothing?: A Lot Help from another  person to put on and taking off regular lower body clothing?: Total 6 Click Score: 13    End of Session Equipment Utilized During Treatment: Oxygen  OT Visit Diagnosis: Unsteadiness on feet (R26.81);Other abnormalities of gait and mobility (R26.89);Muscle weakness (generalized) (M62.81);History of falling (Z91.81);Pain Pain - Right/Left: Left Pain - part of body: Leg   Activity Tolerance Patient tolerated treatment well   Patient Left in bed;with call bell/phone within reach;with family/visitor present (modified seated position)   Nurse Communication Mobility status;Other (comment) (need for prevalon RLE and foam padding)        Time: 3419-3790 OT Time Calculation (min): 44 min  Charges: OT General Charges $OT Visit: 1 Visit OT Treatments $Therapeutic Activity: 8-22 mins $Therapeutic Exercise: 8-22 mins  Joseph [name], OT/L   Acute OT Clinical Specialist Acute Rehabilitation Services Pager [phone] Office [phone]  Madasyn Heath,HILLARY 07/28/2021, 12:48 PM

## 2021-07-29 DIAGNOSIS — Z7189 Other specified counseling: Secondary | ICD-10-CM

## 2021-07-29 LAB — CBC WITH DIFFERENTIAL/PLATELET
Abs Immature Granulocytes: 0.06 10*3/uL (ref 0.00–0.07)
Basophils Absolute: 0 10*3/uL (ref 0.0–0.1)
Basophils Relative: 0 %
Eosinophils Absolute: 0.3 10*3/uL (ref 0.0–0.5)
Eosinophils Relative: 4 %
HCT: 25.9 % — ABNORMAL LOW (ref 39.0–52.0)
Hemoglobin: 7.6 g/dL — ABNORMAL LOW (ref 13.0–17.0)
Immature Granulocytes: 1 %
Lymphocytes Relative: 24 %
Lymphs Abs: 1.7 10*3/uL (ref 0.7–4.0)
MCH: 27.7 pg (ref 26.0–34.0)
MCHC: 29.3 g/dL — ABNORMAL LOW (ref 30.0–36.0)
MCV: 94.5 fL (ref 80.0–100.0)
Monocytes Absolute: 0.9 10*3/uL (ref 0.1–1.0)
Monocytes Relative: 12 %
Neutro Abs: 4.2 10*3/uL (ref 1.7–7.7)
Neutrophils Relative %: 59 %
Platelets: 390 10*3/uL (ref 150–400)
RBC: 2.74 MIL/uL — ABNORMAL LOW (ref 4.22–5.81)
RDW: 17.4 % — ABNORMAL HIGH (ref 11.5–15.5)
WBC: 7.1 10*3/uL (ref 4.0–10.5)
nRBC: 0 % (ref 0.0–0.2)

## 2021-07-29 LAB — GLUCOSE, CAPILLARY
Glucose-Capillary: 50 mg/dL — ABNORMAL LOW (ref 70–99)
Glucose-Capillary: 73 mg/dL (ref 70–99)
Glucose-Capillary: 141 mg/dL — ABNORMAL HIGH (ref 70–99)
Glucose-Capillary: 195 mg/dL — ABNORMAL HIGH (ref 70–99)

## 2021-07-29 LAB — RENAL FUNCTION PANEL
Albumin: 2.3 g/dL — ABNORMAL LOW (ref 3.5–5.0)
Anion gap: 6 (ref 5–15)
BUN: 68 mg/dL — ABNORMAL HIGH (ref 8–23)
CO2: 28 mmol/L (ref 22–32)
Calcium: 8.8 mg/dL — ABNORMAL LOW (ref 8.9–10.3)
Chloride: 100 mmol/L (ref 98–111)
Creatinine, Ser: 3.44 mg/dL — ABNORMAL HIGH (ref 0.61–1.24)
GFR, Estimated: 19 mL/min — ABNORMAL LOW (ref >60)
Glucose, Bld: 63 mg/dL — ABNORMAL LOW (ref 70–99)
Phosphorus: 4.2 mg/dL (ref 2.5–4.6)
Potassium: 3.8 mmol/L (ref 3.5–5.1)
Sodium: 134 mmol/L — ABNORMAL LOW (ref 135–145)

## 2021-07-29 LAB — PROTIME-INR
INR: 1.9 — ABNORMAL HIGH (ref 0.8–1.2)
Prothrombin Time: 22 seconds — ABNORMAL HIGH (ref 11.4–15.2)

## 2021-07-29 MED ORDER — BIOTENE DRY MOUTH MT LIQD: 15.0000 mL | OROMUCOSAL | Status: DC | PRN

## 2021-07-29 MED ORDER — LORAZEPAM 2 MG/ML PO CONC: 1.0000 mg | ORAL | Status: DC | PRN

## 2021-07-29 MED ORDER — GLYCOPYRROLATE 1 MG PO TABS: 1.0000 mg | ORAL_TABLET | ORAL | Status: DC | PRN

## 2021-07-29 MED ORDER — HEPARIN (PORCINE) 25000 UT/250ML-% IV SOLN
2000.0000 [IU]/h | INTRAVENOUS | Status: DC
  Administered 2021-07-29: 2000 [IU]/h via INTRAVENOUS
  Filled 2021-07-29: qty 250

## 2021-07-29 MED ORDER — POLYVINYL ALCOHOL 1.4 % OP SOLN
1.0000 [drp] | Freq: Four times a day (QID) | OPHTHALMIC | Status: DC | PRN
  Filled 2021-07-29: qty 15

## 2021-07-29 MED ORDER — GLYCOPYRROLATE 0.2 MG/ML IJ SOLN: 0.2000 mg | INTRAMUSCULAR | Status: DC | PRN

## 2021-07-29 MED ORDER — BISACODYL 5 MG PO TBEC: 10.0000 mg | DELAYED_RELEASE_TABLET | Freq: Every day | ORAL | Status: DC | PRN

## 2021-07-29 MED ORDER — LORAZEPAM 1 MG PO TABS: 1.0000 mg | ORAL_TABLET | ORAL | Status: DC | PRN

## 2021-07-29 MED ORDER — LORAZEPAM 2 MG/ML IJ SOLN: 1.0000 mg | INTRAMUSCULAR | Status: DC | PRN

## 2021-07-29 MED ORDER — INSULIN GLARGINE-YFGN 100 UNIT/ML ~~LOC~~ SOLN
45.0000 [IU] | Freq: Every day | SUBCUTANEOUS | Status: DC
  Filled 2021-07-29: qty 0.45

## 2021-07-29 MED ORDER — DARBEPOETIN ALFA 200 MCG/0.4ML IJ SOSY
200.0000 ug | PREFILLED_SYRINGE | INTRAMUSCULAR | Status: DC
  Filled 2021-07-29: qty 0.4

## 2021-07-29 MED ORDER — PHENYLEPHRINE HCL-NACL 20-0.9 MG/250ML-% IV SOLN
INTRAVENOUS | Status: AC
  Filled 2021-07-29: qty 500

## 2021-07-29 MED ORDER — HYDROMORPHONE HCL 1 MG/ML IJ SOLN
0.5000 mg | INTRAMUSCULAR | Status: DC | PRN
  Administered 2021-07-29 – 2021-07-31 (×7): 0.5 mg via INTRAVENOUS
  Filled 2021-07-29 (×7): qty 0.5

## 2021-07-29 NOTE — Progress Notes (Signed)
Subjective: The patient and his wife had further discussions regarding dialysis and the patient has decided he would like to stop dialysis and go home.  Objective Vital signs in last 24 hours: Vitals:   07/28/21 1929 07/28/21 2342 07/29/21 0336 07/29/21 0729  BP:  (!) 126/54 (!) 148/40 (!) 158/38  Pulse: 80 77 73 81  Resp: 16 16 17 20   Temp: 99.3 F (37.4 C) 99.9 F (37.7 C) 99.7 F (37.6 C) 99 F (37.2 C)  TempSrc: Axillary Axillary Oral Oral  SpO2: 100% 100% 100% 100%  Weight:      Height:       Weight change:  No intake or output data in the 24 hours ending 07/29/21 1327   Assessment/Plan: 65 year old WM with many medical issues admitted after a fall req surgical management of LLE-  hosp complicated by extremely limited mobility-  has developed some A on CRF with BUN out of proprortion to crt-  refractory so it seems edema  1.AKI on CKD:  Baseline seems to be in the 1.5 range. Etiology of AKI unclear.  u/s and another U/A not giving any clues-  seeming like cardiorenal injury with possible intermittent ischemic damage.   all nephrotoxic meds have been discontinued. Performing dialysis in this gentleman would be very problematic due to his size and his limited mobility and it is doubtful that he would get to a point where he would be appropriate for OP HD logistically -  Dr. Moshe Cipro had discussion with pt and wife in the past and decision was made to try dialysis.  At baseline PTA pt could ambulate in the house-  would go to the MD once a month-  riding in car.  Because of the suffering that the patient is experiencing he has decided to stop dialysis.  We discussed the benefits and risks regarding this decision.  There is still a chance that kidneys will recover but this is difficult to determine.  Would recommend home with hospice. 2. Hypertension/volume  - very volume overloaded-  likely third spacing but also is overloaded-  had been using an albumin/lasix combo to attempt some  diuresis but that was ineffective at achieving a negative balance so required initiation of HD.  Focus further on comfort now 3. Anemia  -can stop medications that are now focused on comfort 4. 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, being managed by pharmacy particularly periprocedureally  Given decision to further focus on comfort and hopefully go home without dialysis we will sign off at this time  Reesa Chew    Labs: Basic Metabolic Panel: Recent Labs  Lab 07/27/21 0320 07/28/21 0624 07/29/21 0407  NA 134* 135 134*  K 4.2 3.6 3.8  CL 99 100 100  CO2 26 29 28   GLUCOSE 150* 93 63*  BUN 91* 57* 68*  CREATININE 3.32* 2.70* 3.44*  CALCIUM 8.7* 8.8* 8.8*  PHOS 5.3* 3.8 4.2   Liver Function Tests: Recent Labs  Lab 07/27/21 0320 07/28/21 0624 07/29/21 0407  ALBUMIN 2.4* 2.3* 2.3*   No results for input(s): LIPASE, AMYLASE in the last 168 hours. No results for input(s): AMMONIA in the last 168 hours. CBC: Recent Labs  Lab 07/23/21 0444 07/25/21 0546 07/26/21 0630 07/27/21 0320 07/29/21 0407  WBC 7.0 7.0 7.5 7.7 7.1  NEUTROABS  --   --   --   --  4.2  HGB 7.2* 7.3* 7.8* 8.3* 7.6*  HCT 25.2* 24.9* 27.5* 28.7* 25.9*  MCV 94.0 94.0  93.5 93.8 94.5  PLT 471* 498* 528* 461* 390   Cardiac Enzymes: No results for input(s): CKTOTAL, CKMB, CKMBINDEX, TROPONINI in the last 168 hours.  CBG: Recent Labs  Lab 07/28/21 1501 07/28/21 2034 07/29/21 0728 07/29/21 0827 07/29/21 1236  GLUCAP 117* 106* 50* 73 141*    Iron Studies:  No results for input(s): IRON, TIBC, TRANSFERRIN, FERRITIN in the last 72 hours.  Studies/Results: No results found. Medications: Infusions:    Scheduled Medications:  docusate sodium  100 mg Oral Daily   FLUoxetine  60 mg Oral QPM   furosemide  80 mg Intravenous Q12H   methocarbamol  1,000 mg Oral BID   oxyCODONE  15 mg Oral Q12H   polyethylene glycol  17 g Oral BID   senna  1 tablet Oral Daily    have reviewed scheduled and prn  medications.  Physical Exam: General:  morbidly obese, lying in bed Heart: normal rate Lungs: Bilateral chest rise with mild increased work of breathing on nasal bipap Abdomen: obese-  abd wall edema  Extremities: pitting edema-  external fixator on left Skin: Skin breakdown of the bilateral lower extremities related to chronic venous stasis    07/29/2021,1:27 PM  LOS: 47 days

## 2021-07-29 NOTE — Progress Notes (Signed)
Patient resting comfortably on home CPAP unit.  

## 2021-07-29 NOTE — Progress Notes (Signed)
[facility]) Hospital Liaison Note  Referral received for Hospice Services. MSW to meet with family at bedside 11/3 @1030  to explain services.   Please call with any questions/concerns.    Thank you for the opportunity to participate in this patient's care.   Daphene Calamity, MSW [facility] Liaison  [phone]

## 2021-07-29 NOTE — Progress Notes (Addendum)
PT Cancellation Note  Patient Details Name: Joseph [name]. MRN: [id number] DOB: 02/26/1956   Cancelled Treatment:    Reason Eval/Treat Not Completed: Other (comment) Per palliative note from 11/1, patient considering comfort care. PT will re-attempt as appropriate and will follow up on Friday 11/4.   Rindi Beechy A. Gilford Rile PT, DPT Acute Rehabilitation Services Pager [phone] Office [phone]    Linna Hoff 07/29/2021, 11:55 AM

## 2021-07-29 NOTE — TOC Progression Note (Addendum)
Transition of Care (TOC) - Progression Note    Patient Details  Name: Joseph [name]. MRN: [id number] Date of Birth: 1956-05-20  Transition of Care [facility]) CM/SW Contact  Curlene Labrum, RN Phone Number: 07/29/2021, 12:37 PM  Clinical Narrative:    Case management met with the patient and wife at the bedside for family meeting with Dorthy Cooler, Palliative Care PA regarding patient request to go home with hospice care support.  CM spoke with the wife and she states that she would like to take the patient home and choice offered regarding hospice and wife would like [facility] to provide care at the home.  I called and spoke with Trixie Dredge, RNCM with [facility] and referral placed for hospice care to return home.  The patient's wife states that she is unable to provide private home care to assist outside of [place] hospice aide.  The patient's wife states that she and the patient live alone at the home.  The patient has a daughter that lives in [place], [place] and a son that lives in [place], [place].  The patient's wife states that she will need home oxygen, incontinence care equipment if available, bariatric bed, and bedside table.  [facility] will follow up regarding home equipment needs after meeting with the patient and wife at the bedside.  At this time, the patient has a CPAP machine and mask at this time provided through [employer].  I spoke with Erin Hearing, NP and asked if patient can be provided with an indwelling urinary catheter since [facility] is unable to provide a Purewick device at home.  Lissa Merlin, NP will follow up.  CM will call and speak with [employer] as well to inquire if the patient's insurance is able to assist with providing an aide at the home if possible. I called and left a message with Herma Ard, RNCM with Utilization review with [employer].  The patient's wife is aware that I would be reaching out to the insurance company for  assistance as necessary.  The patient's wife also requested that the left lower leg external fixator is a concern regarding patient's current comfort and asked if the equipment can be removed or not.  Erin Hearing, NP was updated of patient's request and Burt Knack, [place] states that she will follow up with Dr. Sharol Given regarding this matter.  CM and MSW with [employer] will continue to follow the patient for hospice at home care needs through [facility].  07/29/2021 1400 - I called and spoke with Herma Ard, RN UM with [employer] and she offered that the patient should have available custodial benefits for home care aide at the home.  I called and spoke with the patient's wife, Zayne Draheim, and emailed her the list of custodial providers that she could follow up regarding custodial needs that she may have at the home including days and hours that her family family may not be available to assist her in the home to provide the patient's care.  The patient's wife verbalized understanding and the list was emailed to [email or web address].  Custodial benefits offered through [employer] include 20 hours total of custodial benefits for an aide at the home once the patient is discharged home from the hospital.  CM and MSW will continue to follow the patient for discharge needs for home with hospice through [facility] - discharge likely in the next few days once services and dme can be provided.   Expected Discharge Plan: Skilled Nursing Facility Barriers  to Discharge: Continued Medical Work up (Plan for return to OR with Dr. Sharol Given on 10/14- I&D)  Expected Discharge Plan and Services Expected Discharge Plan: [place] In-house Referral: Clinical Social Work Discharge Planning Services: CM Consult Post Acute Care Choice: [place] arrangements for the past 2 months: Single Family Home                                       Social Determinants of  Health (SDOH) Interventions    Readmission Risk Interventions Readmission Risk Prevention Plan 07/15/2021 07/09/2021  Transportation Screening Complete Complete  Medication Review Press photographer) Complete Complete  PCP or Specialist appointment within 3-5 days of discharge Complete Complete  HRI or Home Care Consult Complete Complete  SW Recovery Care/Counseling Consult Complete Complete  Palliative Care Screening Complete -  [place] Complete Complete  Some recent data might be hidden

## 2021-07-29 NOTE — Progress Notes (Signed)
[facility] PROGRESS NOTE  Joseph [name]. YDX:[id number] DOB: 04/14/1956 DOA: 06/11/2021 PCP: Donald Prose, MD  Status: Remains inpatient appropriate because:  Anticipated we will transition from aggressive care that includes hemodialysis to comfort care.  Medically stable for discharge:  Transitions to comfort care  Barriers to discharge:  Social: Patient prefers to discharge to his home to die but he does not have enough regular assistance from family to likely make this possible  Clinical: Morbid obesity limiting inpatient hospice placement options but this is still being investigated.  Level of care:  Progressive   Code Status: Full Family Communication: No family at bedside today. DVT prophylaxis: Coumadin/IV heparin bridging COVID vaccination status: Unknown    HPI: 65 y.o. male with PMH significant for severe morbid obesity, OSA on CPAP, DM2, HTN, HLD, PAD, DVT on Coumadin, GERD, anxiety/depression, polyneuropathy, multiple falls who presented to the ED on 06/11/21 after fall at home resulting in left knee pain.  He was found to have left knee dislocation and fracture fragments with associated left knee effusion.  There were concern for possible left knee hematoma in setting of supratherapeutic INR.  Seen by orthopedic surgery, patient underwent closed reduction of left knee dislocation but no surgical management was planned because of patient's body habitus.  Patient is scheduled for a I&D on 07/08/2021 by Dr. Sharol Given.   Subjective: Patient states pain much better controlled after long-acting oxycodone pontine increased to 15 mg twice daily yesterday.  Objective: Vitals:   07/29/21 0336 07/29/21 0729  BP: (!) 148/40 (!) 158/38  Pulse: 73 81  Resp: 17 20  Temp: 99.7 F (37.6 C) 99 F (37.2 C)  SpO2: 100% 100%    Intake/Output Summary (Last 24 hours) at 07/29/2021 0803 Last data filed at 07/28/2021 1300 Gross per 24 hour  Intake 200 ml  Output 200 ml  Net  0 ml   Filed Weights   07/25/21 0717 07/27/21 1236 07/27/21 1625  Weight: 107.7 kg (!) 228 kg (!) 224.9 kg    Exam:  Constitutional: NAD, calm Respiratory: CTA bilaterally, Normal respiratory effort. No accessory muscle use.  CPAP in place, pulse oximetry 100% Cardiovascular.  S1-S2, sinus rhythm, normotensive continues with acute on chronic lower extremity edema with brawny and hemosiderin skin changes below the knees-serous oozing also noted worse on the right leg. Abdomen: no tenderness,  Bowel sounds positive. LBM 10/29 Musculoskeletal: External fixation device on left lower extremity Neurologic: CN 2-12 grossly intact. Sensation intact, Strength 3/5 x all 4 extremities.  Psychiatric: Normal judgment and insight. Alert and oriented x 3.  Depressed mood.    Assessment/Plan: Acute problems: Left knee dislocation after fall.  Status post left knee reduction external fixation with evacuation Left knee hematoma -X-ray w/ L knee dislocation and several fracture w/fragments/hematoma.  Initially treated with conservative nonoperative management but follow-up MRI revealed significant ligamentous injury with patellar dislocation that required surgical repair including application of external fixator device -Continue OxyContin 15 mg BID as well as Oxy IR for breakthrough pain; continue scheduled Robaxin twice daily as well as as needed dosing for breakthrough muscle spasm -Wife states that initial injury occurred after falling in shower at home with patient stating that his leg "just gave way"    06/11/2021                  06/18/2021     07/14/2021                 07/14/2021  History of pulmonary embolism/chronic anticoagulation with warfarin Continue warfarin-INR subtherapeutic as of 11/2.  Since have not yet transitioned to comfort measures discussed with pharmacy and plan is to utilize IV heparin as bridge   Recurrent AKI on CKD  with recurrent hyperkalemia Baseline creatinine  1.2  w/ GFR >60.   Significant anasarca that has not responded to diuretics Nephrology evaluated and started him on hemodialysis.  They do not expect full renal recovery.  This was discussed with family/patient on 11/1 with patient desiring to stop dialysis and transition to comfort care once discharge disposition and timing arranged.  EOL/goals of care PMT working with patient and family to determine best discharge disposition Currently is DNR and patient request to not stop current treatment modalities until he is discharged  Chronic peripheral edema/question evolving cellulitis RLE See above under recurrent AKI and CKD White count is normal so changes likely related to chronic severe stasis dermatitis and not cellulitis  Depression /anxiety -Continue Prozac  Profound physical deconditioning Patient not making any progress and appears to be chronically bedbound.  Wife would be unable to take care of him at home and he is too large to be admitted to most skilled nursing facilities.  He is aware he is too large and too deconditioned to tolerate outpatient hemodialysis therefore part of the rationale and stopping dialysis.   Type 2 diabetes mellitus Diabetic neuropathy -A1c 6.1 on 9/22 -Currently on Actos; 11/2 decrease Semglee 55 units nightly given a.m. hypoglycemia down to 50 today  -Continue NovoLog 3 units  TID AC and SSI   Essential hypertension Given suboptimal BP readings preadmission amlodipine discontinued Lisinopril discontinued secondary to acute renal dysfunction   Hyperlipidemia Pravastatin on hold.   Chronic normocytic anemia Baseline hemoglobin ~10.   S/p of 2 units of PRBCs and 1 unit of FFP.   Current hemoglobin 8.3 Continue Aranesp per nephrology   Transient bradycardia/ 1st degree heart block/frequent PVCs Asymptomatic in context of known sleep apnea but occurs while awake.  EKG significant for 1st degree AV block which is chronic.  Telemetry with heart rate down  to high 30s while awake Since likely will transition to comfort measures no indication to follow-up with cardiology to apply Zio patch   Severe morbid obesity  -Body mass index is 63.41 kg/m. Patient has been advised to make an attempt to improve diet and exercise patterns to aid in weight loss. PT recommended Kreg bed.   GERD -Continue Protonix-if renal function does not rapidly improve will need to hold until renal function normal   OSA, compliant with CPAP Has been compliant with CPAP, encouraged to continue.   Constipation, opiate induced -Continue Colace, Senokot, Miralax -Dulcolax suppository prn   Pressure injury -Mid-nose, not present on admission. Secondary to CPAP mask -Continue foam dressing over bridge of nose     Data Reviewed: Basic Metabolic Panel: Recent Labs  Lab 07/25/21 0546 07/26/21 0630 07/27/21 0320 07/28/21 0624 07/29/21 0407  NA 135 134* 134* 135 134*  K 4.2 4.0 4.2 3.6 3.8  CL 101 100 99 100 100  CO2 24 25 26 29 28   GLUCOSE 118* 134* 150* 93 63*  BUN 120* 82* 91* 57* 68*  CREATININE 2.92* 2.81* 3.32* 2.70* 3.44*  CALCIUM 8.6* 8.9 8.7* 8.8* 8.8*  PHOS 4.6 4.5 5.3* 3.8 4.2   Liver Function Tests: Recent Labs  Lab 07/25/21 0546 07/26/21 0630 07/27/21 0320 07/28/21 0624 07/29/21 0407  ALBUMIN 2.6* 2.5* 2.4* 2.3* 2.3*    CBC: Recent  Labs  Lab 07/23/21 0444 07/25/21 0546 07/26/21 0630 07/27/21 0320  WBC 7.0 7.0 7.5 7.7  HGB 7.2* 7.3* 7.8* 8.3*  HCT 25.2* 24.9* 27.5* 28.7*  MCV 94.0 94.0 93.5 93.8  PLT 471* 498* 528* 461*   CBG: Recent Labs  Lab 07/28/21 0750 07/28/21 1216 07/28/21 1501 07/28/21 2034 07/29/21 0728  GLUCAP 77 141* 117* 106* 50*      Scheduled Meds:  (feeding supplement) PROSource Plus  30 mL Oral TID BM   vitamin C  1,000 mg Oral Daily   darbepoetin (ARANESP) injection - NON-DIALYSIS  200 mcg Subcutaneous Q Mon-1800   docusate sodium  100 mg Oral Daily   feeding supplement (GLUCERNA SHAKE)  237 mL  Oral BID BM   FLUoxetine  60 mg Oral QPM   furosemide  80 mg Intravenous Q12H   influenza vac split quadrivalent PF  0.5 mL Intramuscular Tomorrow-1000   insulin aspart  0-20 Units Subcutaneous TID WC   insulin aspart  0-5 Units Subcutaneous QHS   insulin aspart  3 Units Subcutaneous TID WC   insulin glargine-yfgn  55 Units Subcutaneous QHS   methocarbamol  1,000 mg Oral BID   multivitamin with minerals  1 tablet Oral Daily   oxyCODONE  15 mg Oral Q12H   polyethylene glycol  17 g Oral BID   Ensure Max Protein  11 oz Oral Daily   senna  1 tablet Oral Daily   Continuous Infusions:  sodium chloride 10 mL/hr at 07/13/21 1107    Principal Problem:   Left knee dislocation, initial encounter Active Problems:   Pure hypercholesterolemia   Essential hypertension, benign   OSA (obstructive sleep apnea)   Hx pulmonary embolism   Esophageal reflux   Diabetes mellitus type 2, uncontrolled   Diabetic neuropathy (HCC)   Acute renal failure superimposed on stage 3b chronic kidney disease (HCC)   Normocytic anemia   Class 3 obesity (HCC)   Aortic atherosclerosis (HCC)   Coronary atherosclerosis   Effusion of left knee   Left knee dislocation   Pressure injury of skin   Unstageable pressure ulcer of sacral region ([place])   At risk for hemorrhage associated with anticoagulation therapy   Ulcer of left knee, with necrosis of bone ([place])   Open knee wound, left, subsequent encounter   Moderate episode of recurrent major depressive disorder [facility])   Consultants: Orthopedics Cardiology  Procedures: Echocardiogram Left knee reduction with external fixation and evacuation of hematoma Left thigh debridement on 10/12 and again on 10/14  Antibiotics: Cephalexin 9/15 through 10/4 Cefazolin 10/5 >>   Time spent: 25 minutes    Erin Hearing ANP  [facility] 7 am - 330 pm/M-F for direct patient care and secure chat Please refer to Amion for contact info 47  days

## 2021-07-29 NOTE — Progress Notes (Signed)
ANTICOAGULATION CONSULT NOTE  Pharmacy Consult for heparin  Indication: History of PE/DVT  Allergies  Allergen Reactions   Tape     Peels skin on legs   Chlorhexidine Rash    Wipes    Patient Measurements: Height: 6\' 1"  (185.4 cm) Weight: (!) 224.9 kg (495 lb 13 oz) IBW/kg (Calculated) : 79.9 Heparin dosing wt: 98kg  Vital Signs: Temp: 99 F (37.2 C) (11/02 0729) Temp Source: Oral (11/02 0729) BP: 158/38 (11/02 0729) Pulse Rate: 81 (11/02 0729)  Labs: Recent Labs    07/27/21 0320 07/28/21 0624 07/29/21 0407  HGB 8.3*  --   --   HCT 28.7*  --   --   PLT 461*  --   --   LABPROT 26.0* 24.9* 22.0*  INR 2.4* 2.3* 1.9*  HEPARINUNFRC 0.38  --   --   CREATININE 3.32* 2.70* 3.44*     Estimated Creatinine Clearance: 42.3 mL/min (A) (by C-G formula based on SCr of 3.44 mg/dL (H)).   Assessment: 30 YOM on lifelong warfarin therapy for history of PE/DVT, body habitus, and sedentary lifestyle admitted on 9/15 with left knee dislocation and effusion concerning for hematoma. At admission, warfarin was held, vitamin K was given for supratherapeutic INR (6.2), and pt was transfused.   Patient has temporary dialysis catheter in place. Waiting on placement of permacath if patient decides to pursue dialysis. Bridging with IV heparin until then.   Heparin level previously therapeutic on 2900 units/hr; however, INR trended up to therapeutic level and drip was held. To be resumed today with INR now <2 at 1.9. Patient received HD previously while on heparin - last session 10/31, but patient contemplating declining further HD and discussing with palliative. CBC stable and getting Aranesp weekly. No overt bleeding reported. SCr trended up to 3.44.  Goal of Therapy:  Heparin level 0.3-0.7 units/ml Monitor platelets by anticoagulation protocol: Yes   Plan:  Resume heparin at 2000 units/hr - will start at lower rate than previous with patient not receiving dialysis in several days and  contemplating stopping HD altogether (palliative following for GOC) Check 6hr heparin level - may need to aggressively increase if subtherapeutic based on previous requirements Monitor daily CBC, s/sx bleeding F/u plans to transition back to warfarin as appropriate   Arturo Morton, PharmD, BCPS Please check AMION for all [facility] contact numbers Clinical Pharmacist 07/29/2021 7:38 AM

## 2021-07-29 NOTE — Progress Notes (Signed)
   Palliative Medicine Inpatient Follow Up Note   HPI: Palliative Care consult requested on hospital day 42 for symptom management and goals of care discussion in this 65 y.o. male  with past medical history of morbid obesity, falls, sleep apnea (CPAP), type 2 diabetes, hyperlipidemia, PAD, hypertension, DVT (Coumadin), anxiety, depression, and GERD.  He was admitted on 06/11/2021 from home with s/p fall resulting in knee dislocation.  Hospital course complicated by closed reduction of left knee dislocation, significant ligamentous injury and lateral patellar dislocation s/p extensive debridement, reduction of dislocation, external fixation with wound VAC to chronic left thigh wound, respiratory failure, acute on chronic kidney failure requiring short-term dialysis.  Today's Discussion (07/29/2021):  *Please note that this is a verbal dictation therefore any spelling or grammatical errors are due to the "[employer]" system interpretation.  Chart reviewed. Patient assessed at the bedside and breathing comfortably on CPAP. Met with his wife Eustaquio Maize and [employer].  Created space and opportunity for patient and family's thoughts and feelings on the option of comfort care and hospice care. Reviewed in detail and patient and family are ready to proceed with transition to comfort care and referral to hospice at this time.  Questions and concerns addressed. PMT will continue to support holistically.   Objective Assessment: Vital Signs Vitals:   07/29/21 0336 07/29/21 0729  BP: (!) 148/40 (!) 158/38  Pulse: 73 81  Resp: 17 20  Temp: 99.7 F (37.6 C) 99 F (37.2 C)  SpO2: 100% 100%    Intake/Output Summary (Last 24 hours) at 07/29/2021 0957 Last data filed at 07/28/2021 1300 Gross per 24 hour  Intake 200 ml  Output 200 ml  Net 0 ml    Last Weight  Most recent update: 07/27/2021 11:42 PM    Weight  224.9 kg (495 lb 13 oz)              Physical Exam General: NAD,  morbidly obese chronically-ill appearing Cardiovascular: regular rate and rhythm  Pulmonary: CPAP in place, normal breathing pattern Abdomen: soft, nontender Extremities: Anasarca, bilateral lower extremity pitting edema Skin: warm and dry, ulceration to nose bridge Neurological: AAOx3, mood appropriate  SUMMARY OF RECOMMENDATIONS   Transition to comfort-focused care today  Dilaudid PRN for pain/air hunger/comfort Robinul PRN for excessive secretions Ativan PRN for agitation/anxiety Zofran PRN for nausea Liquifilm tears PRN for dry eyes May have comfort feeding Comfort cart for family Unrestricted visitations in the setting of EOL (per policy) May continue CPAP for comfort. No escalation.   Patient is ready to initiate referral to hospice and would prefer to go home if possible; TOC assistance appreciated  Discussed external fixator with Dr. Sharol Given per wife's request - he recommends leaving on to avoid dislocation and pain with movements Psychosocial and emotional support provided Ongoing support from PMT   Time Spent:  45 minutes Greater than 50% of the time was spent in counseling and coordination of care  Haydon Dorris, [place] Team Team Cell Phone: [phone] Please utilize secure chat with additional questions, if there is no response within 30 minutes please call the above phone number  Palliative Medicine Team providers are available by phone from 7am to 7pm daily and can be reached through the team cell phone.  Should this patient require assistance outside of these hours, please call the patient's attending physician.

## 2021-07-29 NOTE — Progress Notes (Signed)
Patient resting well on home CPAP.  VSS RT will continue to monitor.

## 2021-07-30 DIAGNOSIS — Z66 Do not resuscitate: Secondary | ICD-10-CM

## 2021-07-30 LAB — GLUCOSE, CAPILLARY
Glucose-Capillary: 174 mg/dL — ABNORMAL HIGH (ref 70–99)
Glucose-Capillary: 212 mg/dL — ABNORMAL HIGH (ref 70–99)
Glucose-Capillary: 164 mg/dL — ABNORMAL HIGH (ref 70–99)
Glucose-Capillary: 142 mg/dL — ABNORMAL HIGH (ref 70–99)

## 2021-07-30 MED ORDER — LORAZEPAM 1 MG PO TABS: 1.0000 mg | ORAL_TABLET | ORAL | 0 refills | Status: AC | PRN

## 2021-07-30 MED ORDER — METHOCARBAMOL 500 MG PO TABS: 1000.0000 mg | ORAL_TABLET | Freq: Two times a day (BID) | ORAL | 0 refills | Status: AC

## 2021-07-30 MED ORDER — ALUM & MAG HYDROXIDE-SIMETH 200-200-20 MG/5ML PO SUSP: 15.0000 mL | ORAL | 0 refills | Status: AC | PRN

## 2021-07-30 MED ORDER — ACETAMINOPHEN 10 MG/ML IV SOLN
INTRAVENOUS | Status: AC
  Filled 2021-07-30: qty 100

## 2021-07-30 MED ORDER — FLUOXETINE HCL 20 MG PO CAPS
60.0000 mg | ORAL_CAPSULE | Freq: Once | ORAL | Status: AC
  Administered 2021-07-30: 60 mg via ORAL
  Filled 2021-07-30: qty 3

## 2021-07-30 MED ORDER — INSULIN ASPART 100 UNIT/ML IJ SOLN
3.0000 [IU] | Freq: Three times a day (TID) | INTRAMUSCULAR | Status: DC
  Administered 2021-07-30 (×2): 3 [IU] via SUBCUTANEOUS

## 2021-07-30 MED ORDER — VITAMINS A & D EX OINT: TOPICAL_OINTMENT | CUTANEOUS | 0 refills | Status: AC | PRN

## 2021-07-30 MED ORDER — FLUOXETINE HCL 20 MG PO CAPS: 60.0000 mg | ORAL_CAPSULE | Freq: Every evening | ORAL | 3 refills | Status: AC

## 2021-07-30 MED ORDER — HYDROXYZINE HCL 25 MG PO TABS: 25.0000 mg | ORAL_TABLET | Freq: Four times a day (QID) | ORAL | 0 refills | Status: AC | PRN

## 2021-07-30 MED ORDER — LANTUS 100 UNIT/ML ~~LOC~~ SOLN: 45.0000 [IU] | Freq: Every day | SUBCUTANEOUS | 11 refills | Status: AC

## 2021-07-30 MED ORDER — INSULIN ASPART 100 UNIT/ML IJ SOLN
0.0000 [IU] | Freq: Three times a day (TID) | INTRAMUSCULAR | 11 refills | Status: AC
Start: 2021-07-30 — End: ?

## 2021-07-30 MED ORDER — GLYCOPYRROLATE 1 MG PO TABS: 1.0000 mg | ORAL_TABLET | ORAL | 0 refills | Status: AC | PRN

## 2021-07-30 MED ORDER — INSULIN ASPART 100 UNIT/ML IJ SOLN
0.0000 [IU] | Freq: Three times a day (TID) | INTRAMUSCULAR | Status: DC
  Administered 2021-07-30: 1 [IU] via SUBCUTANEOUS
  Administered 2021-07-30: 2 [IU] via SUBCUTANEOUS

## 2021-07-30 MED ORDER — BISACODYL 5 MG PO TBEC: 10.0000 mg | DELAYED_RELEASE_TABLET | Freq: Every day | ORAL | 0 refills | Status: AC | PRN

## 2021-07-30 MED ORDER — OXYCODONE HCL ER 20 MG PO T12A: 20.0000 mg | EXTENDED_RELEASE_TABLET | Freq: Two times a day (BID) | ORAL | 0 refills | Status: AC

## 2021-07-30 MED ORDER — INSULIN ASPART 100 UNIT/ML IJ SOLN: 3.0000 [IU] | Freq: Three times a day (TID) | INTRAMUSCULAR | 11 refills | Status: AC

## 2021-07-30 MED ORDER — ONDANSETRON HCL 4 MG PO TABS: 4.0000 mg | ORAL_TABLET | Freq: Four times a day (QID) | ORAL | 0 refills | Status: AC | PRN

## 2021-07-30 MED ORDER — METOCLOPRAMIDE HCL 5 MG PO TABS: 5.0000 mg | ORAL_TABLET | Freq: Three times a day (TID) | ORAL | 0 refills | Status: AC | PRN

## 2021-07-30 MED ORDER — INSULIN GLARGINE-YFGN 100 UNIT/ML ~~LOC~~ SOLN
45.0000 [IU] | Freq: Every day | SUBCUTANEOUS | Status: DC
  Administered 2021-07-30: 45 [IU] via SUBCUTANEOUS
  Filled 2021-07-30 (×2): qty 0.45

## 2021-07-30 MED ORDER — OXYCODONE HCL 5 MG PO TABS: 5.0000 mg | ORAL_TABLET | ORAL | 0 refills | Status: AC | PRN

## 2021-07-30 NOTE — Discharge Summary (Signed)
Physician Discharge Summary  Joseph [name]. GUR:[id number] DOB: 04/01/1956 DOA: 06/11/2021  PCP: Donald Prose, MD  Admit date: 06/11/2021 Discharge date: 07/31/2021  Time spent: 45 minutes  Recommendations for Outpatient Follow-up:  Discharge home with wife and family end-of-life care    Discharge Diagnoses:  Principal Problem:   Left knee dislocation, initial encounter Active Problems:   Pure hypercholesterolemia   Essential hypertension, benign   OSA (obstructive sleep apnea)   Hx pulmonary embolism   Esophageal reflux   Diabetes mellitus type 2, uncontrolled   Diabetic neuropathy (HCC)   Acute renal failure superimposed on stage 3b chronic kidney disease (HCC)   Normocytic anemia   Class 3 obesity (HCC)   Aortic atherosclerosis (HCC)   Coronary atherosclerosis   Effusion of left knee   Left knee dislocation   Pressure injury of skin   Unstageable pressure ulcer of sacral region ([place])   At risk for hemorrhage associated with anticoagulation therapy   Ulcer of left knee, with necrosis of bone (HCC)   Open knee wound, left, subsequent encounter   Moderate episode of recurrent major depressive disorder ([place])   Goals of care, counseling/discussion    Discharge Condition: Stable  Diet recommendation: As tolerated for comfort  Filed Weights   07/25/21 0717 07/27/21 1236 07/27/21 1625  Weight: 107.7 kg (!) 228 kg (!) 224.9 kg    History of present illness:  65 y.o. male with PMH significant for severe morbid obesity, OSA on CPAP, DM2, HTN, HLD, PAD, DVT on Coumadin, GERD, anxiety/depression, polyneuropathy, multiple falls who presented to the ED on 06/11/21 after fall at home resulting in left knee pain.  He was found to have left knee dislocation and fracture fragments with associated left knee effusion.  There were concern for possible left knee hematoma in setting of supratherapeutic INR.  Seen by orthopedic surgery, patient underwent closed reduction of left knee  dislocation but no surgical management was planned because of patient's body habitus.  Patient also underwent I&D on 07/08/2021 by Dr. Sharol Given.  Since admission he has developed severe anasarca that did not respond to diuretics.  Nephrology was consulted.  Dialysis initiated with hopes of possible renal recovery.  Unfortunately nephrology does not expect full renal recovery and after lengthy discussion with patient and wife as well as PMT decision was made to stop hemodialysis and transition to comfort care as of 11/2  Hospital Course:  Left knee dislocation after fall.  Status post left knee reduction external fixation with evacuation Left knee hematoma -X-ray w/ L knee dislocation and several fracture w/fragments/hematoma.  Initially treated with conservative nonoperative management but follow-up MRI revealed significant ligamentous injury with patellar dislocation that required surgical repair including application of external fixator device-orthopedic surgeon recommended leaving in place upon discharge noting without fixator patient would have increased pain -Continue OxyContin 20 mg BID as well as Oxy IR for breakthrough pain; continue scheduled Robaxin twice daily as well as as needed dosing for breakthrough muscle spasm-pills can transition to concentrated morphine to give through buccal route -Wound care focus will be on drainage management and not on healing      06/11/2021                  06/18/2021      07/14/2021                 07/14/2021      History of pulmonary embolism/chronic anticoagulation with warfarin Warfarin discontinued in context of end-of-life care  Recurrent AKI on CKD  with recurrent hyperkalemia 11/1 decision made to stop dialysis and pursue comfort care.     EOL/goals of care Plan is to discharge patient home to continue end-of-life care   Chronic peripheral edema/chronic diastolic heart failure The Lasix for swelling and to help with any shortness of breath as a  comfort measure  Depression /anxiety -Continue Prozac as comfort measures   Profound physical deconditioning Focus has transition to comfort measures   Type 2 diabetes mellitus Diabetic neuropathy Patient reported that he wanted to continue insulin therapy after discharge.  He reported that he feels terrible when his sugar is high Continue Lantus 45 units daily, meal coverage 3 times daily 3 units, and sliding scale insulin upon discharge   Essential hypertension Currently on no antihypertensive   Hyperlipidemia Pravastatin discontinued   Chronic normocytic anemia Required 2 units PRBCs during this admission but given focus has now switched to comfort will not repeat any lab   Transient bradycardia/ 1st degree heart block/frequent PVCs Focus now on comfort and no indication to work-up above arrhythmias   Severe morbid obesity  -Body mass index is 63.41 kg/m.  -Patient unable to void easily and has been requiring a pure wick device collect urine and to prevent urine contact with skin that could cause maceration and other skin injury   GERD -Continue prior to admission Prilosec for any indigestion   OSA, compliant with CPAP Continue CPAP after discharge   Constipation, opiate induced -Continue Colace, Senokot, Miralax as tolerated   Pressure injury -Mid-nose, not present on admission. Secondary to CPAP mask -Continue foam dressing over bridge of nose    Procedures: Echocardiogram Left knee reduction with external fixation and evacuation of hematoma Left thigh debridement on 10/12 and again on 10/14  Consultations: Orthopedics Cardiology Nephrology Palliative medicine  Discharge Exam: Vitals:   07/30/21 2011 07/31/21 0735  BP: (!) 136/58 (!) 142/47  Pulse: 78 76  Resp: 16 18  Temp: 98.5 F (36.9 C) 98.3 F (36.8 C)  SpO2: 98% 98%   Constitutional: NAD, calm Respiratory: CTA bilaterally, Normal respiratory effort. No accessory muscle use.  CPAP in place,  pulse oximetry 100% Cardiovascular.  S1-S2, sinus rhythm, normotensive continues with acute on chronic lower extremity edema with brawny and hemosiderin skin changes below the knees-serous oozing also noted worse on the right leg. Abdomen: no tenderness,  Bowel sounds positive. LBM 10/29 Musculoskeletal: External fixation device on left lower extremity Neurologic: CN 2-12 grossly intact. Sensation intact, Strength 3/5 x all 4 extremities.  Psychiatric: Normal judgment and insight. Alert and oriented x 3.   Discharge Instructions   Discharge Instructions     Call MD for:  severe uncontrolled pain   Complete by: As directed    Diet general   Complete by: As directed    Discharge wound care:   Complete by: As directed    Wound care management will be focused on comfort and not healing  Thigh wound: Apply nonstick/Telfa dressing to wound base and secure with tape (paper tape preferred) and change as needed once dressing is soiled  Other wounds: Utilize Telfa dressings if needed to manage drainage   Increase activity slowly   Complete by: As directed       Allergies as of 07/31/2021       Reactions   Ativan [lorazepam] Other (See Comments)   Patient reports decreased alertness- noted as patient requests not to have this med administered if possible   Tape  Peels skin on legs   Chlorhexidine Rash   Wipes        Medication List     STOP taking these medications    amLODipine 10 MG tablet Commonly known as: NORVASC   Benefiber Powd   cephALEXin 250 MG capsule Commonly known as: KEFLEX   indapamide 2.5 MG tablet Commonly known as: LOZOL   metFORMIN 1000 MG tablet Commonly known as: GLUCOPHAGE   omeprazole 40 MG capsule Commonly known as: PRILOSEC   pioglitazone 45 MG tablet Commonly known as: ACTOS   pravastatin 40 MG tablet Commonly known as: PRAVACHOL   PRENATAL FA PO   quinapril 40 MG tablet Commonly known as: ACCUPRIL   tamsulosin 0.4 MG Caps  capsule Commonly known as: FLOMAX   warfarin 5 MG tablet Commonly known as: COUMADIN       TAKE these medications    acetaminophen 500 MG tablet Commonly known as: TYLENOL Take 1,000 mg by mouth daily as needed for moderate pain or headache.   alum & mag hydroxide-simeth 200-200-20 MG/5ML suspension Commonly known as: MAALOX/MYLANTA Take 15-30 mLs by mouth every 2 (two) hours as needed for indigestion.   bisacodyl 5 MG EC tablet Commonly known as: DULCOLAX Take 2 tablets (10 mg total) by mouth daily as needed for moderate constipation.   FLUoxetine 20 MG capsule Commonly known as: PROZAC Take 3 capsules (60 mg total) by mouth every evening. What changed:  medication strength how much to take   FreeStyle Libre 2 Sensor Misc APPLY EVERY 14 DAYS AS DIRECTED   FREESTYLE LITE test strip Generic drug: glucose blood TEST BLOOD SUGAR FOUR TIMES DAILY   furosemide 40 MG tablet Commonly known as: LASIX Take 40 mg by mouth daily.   glycopyrrolate 1 MG tablet Commonly known as: ROBINUL Take 1 tablet (1 mg total) by mouth every 4 (four) hours as needed (excessive secretions).   hydrOXYzine 25 MG tablet Commonly known as: ATARAX/VISTARIL Take 1 tablet (25 mg total) by mouth every 6 (six) hours as needed for anxiety.   insulin aspart 100 UNIT/ML injection Commonly known as: novoLOG Inject 0-6 Units into the skin 3 (three) times daily with meals. Do NOT hold if patient is NPO. What changed:  how much to take when to take this reasons to take this additional instructions   insulin aspart 100 UNIT/ML injection Commonly known as: novoLOG Inject 3 Units into the skin 3 (three) times daily with meals. What changed: You were already taking a medication with the same name, and this prescription was added. Make sure you understand how and when to take each.   Lantus 100 UNIT/ML injection Generic drug: insulin glargine Inject 0.45 mLs (45 Units total) into the skin at  bedtime. What changed: how much to take   LORazepam 1 MG tablet Commonly known as: ATIVAN Take 1 tablet (1 mg total) by mouth every 2 (two) hours as needed for anxiety (restlessness/agitation/comfort).   methocarbamol 500 MG tablet Commonly known as: ROBAXIN Take 2 tablets (1,000 mg total) by mouth 2 (two) times daily for 7 days.   metoCLOPramide 5 MG tablet Commonly known as: REGLAN Take 1-2 tablets (5-10 mg total) by mouth every 8 (eight) hours as needed for nausea (if ondansetron (ZOFRAN) ineffective.).   ondansetron 4 MG tablet Commonly known as: ZOFRAN Take 1 tablet (4 mg total) by mouth every 6 (six) hours as needed for nausea.   oxyCODONE 5 MG immediate release tablet Commonly known as: Oxy IR/ROXICODONE Take 1 tablet (5  mg total) by mouth every 4 (four) hours as needed for moderate pain.   oxyCODONE 20 mg 12 hr tablet Commonly known as: OXYCONTIN Take 1 tablet (20 mg total) by mouth every 12 (twelve) hours.   silver sulfADIAZINE 1 % cream Commonly known as: Silvadene Apply 1 application topically daily. What changed:  when to take this reasons to take this   vitamin A & D ointment Apply topically as needed for dry skin (place over nose bridge).               Discharge Care Instructions  (From admission, onward)           Start     Ordered   07/31/21 0000  Discharge wound care:       Comments: Wound care management will be focused on comfort and not healing  Thigh wound: Apply nonstick/Telfa dressing to wound base and secure with tape (paper tape preferred) and change as needed once dressing is soiled  Other wounds: Utilize Telfa dressings if needed to manage drainage   07/31/21 0754           Allergies  Allergen Reactions   Ativan [Lorazepam] Other (See Comments)    Patient reports decreased alertness- noted as patient requests not to have this med administered if possible   Tape     Peels skin on legs   Chlorhexidine Rash    Wipes     Follow-up Information     Newt Minion, MD Follow up in 1 week(s).   Specialty: Orthopedic Surgery Contact information: [place] [address] [address] [place] Follow up.   Specialty: Hospice and Palliative Medicine Why: [facility] will be providing hospice care support at the home. Contact information: [place] [phone]                 The results of significant diagnostics from this hospitalization (including imaging, microbiology, ancillary and laboratory) are listed below for reference.    Significant Diagnostic Studies: DG Knee 1-2 Views Left  Result Date: 07/01/2021 CLINICAL DATA:  Intraoperative repair EXAM: LEFT KNEE - 1-2 VIEW COMPARISON:  06/15/2021 FINDINGS: Ten low resolution intraoperative spot views of the left knee. Total fluoroscopy time was 26 seconds. Images were obtained during operative placement of external fixation device. IMPRESSION: Intraoperative fluoroscopic assistance provided during knee surgery Electronically Signed   By: Donavan Foil M.D.   On: 07/01/2021 21:32   US RENAL  Result Date: 07/20/2021 CLINICAL DATA:  Acute kidney injury EXAM: RENAL / URINARY TRACT ULTRASOUND COMPLETE COMPARISON:  CT 06/11/2021 FINDINGS: Right Kidney: Renal measurements: 13 x 6.5 x 6.9 cm = volume: 302.6 mL. Echogenicity within normal limits. No mass or hydronephrosis visualized. Left Kidney: Unable to visualize due to bowel gas. Bladder: Unable to visualize due to habitus Other: None. IMPRESSION: 1. Normal ultrasound appearance of right kidney 2. Left kidney and bladder were unable to be visualized secondary to combination of habitus and gas. Electronically Signed   By: Donavan Foil M.D.   On: 07/20/2021 21:03   IR US Guide Vasc Access Right  Result Date: 07/24/2021 INDICATION: 65 year old gentleman with renal failure requires dialysis access. Due to the patient's weight,  procedure could not be performed on the angio table. Procedure performed at bedside with ultrasound guidance. The position of the catheter was confirmed on chest radiograph. EXAM: Ultrasound-guided right IJ temporary hemodialysis catheter placement. MEDICATIONS: None ANESTHESIA/SEDATION: None  FLUOROSCOPY TIME:  None COMPLICATIONS: None immediate. PROCEDURE: Informed written consent was obtained from the patient after a thorough discussion of the procedural risks, benefits and alternatives. All questions were addressed. Maximal Sterile Barrier Technique was utilized including caps, mask, sterile gowns, sterile gloves, sterile drape, hand hygiene and skin antiseptic. A timeout was performed prior to the initiation of the procedure. Right neck prepped and draped in usual fashion. Ultrasound image documenting patency of the right internal jugular vein was obtained and placed in permanent medical record. Sterile ultrasound probe cover and sterile gel utilized throughout the procedure. Using continuous ultrasound guidance, the right internal jugular vein was accessed with a 21 gauge needle. 21 gauge needle exchanged for transitional dilator set over 0.018 inch guidewire. 0.035 inch guidewire advanced through the 5 [place] dilator without difficulty. Serial dilation was performed over the 0.035 inch guidewire and 15 cm dialysis catheter was inserted. All lumens aspirated and flushed well. The dialysis lumens were locked with heparin. The catheter was secured to skin with suture at and the insert site was covered with bio patch and sterile dressing. Post placement radiograph showed the catheter tip in the superior vena cava. IMPRESSION: 1. 15 cm right IJ temporary hemodialysis catheter is ready for use. 2. Tunneled hemodialysis catheter was not placed as the patient's weight was above the threshold of the angiography table. Electronically Signed   By: Miachel Roux M.D.   On: 07/24/2021 14:42   DG Chest Port 1  View  Result Date: 07/24/2021 CLINICAL DATA:  Temporary hemodialysis catheter placement EXAM: PORTABLE CHEST 1 VIEW COMPARISON:  07/20/2021 FINDINGS: The heart size within normal limits. Mild interval worsening of pulmonary vascular congestion. No pneumothorax. Bibasilar patchy opacity likely result of atelectasis. Newly placed right IJ dialysis catheter terminates in the region of the superior vena cava. IMPRESSION: 1. Newly placed right IJ temporary hemodialysis catheter terminates in the region of the superior vena cava. 2. Mild interval worsening of pulmonary vascular congestion and bibasilar atelectasis. Electronically Signed   By: Miachel Roux M.D.   On: 07/24/2021 14:43   DG CHEST PORT 1 VIEW  Result Date: 07/20/2021 CLINICAL DATA:  65 year old male with shortness of breath. EXAM: PORTABLE CHEST 1 VIEW COMPARISON:  Portable chest 07/18/2021 and earlier. FINDINGS: Portable AP semi upright view at 0900 hours. Rotated to the left similar to before. Mildly larger lung volumes. Mediastinal contours are stable and within normal limits. Abnormal course bilateral pulmonary interstitial opacity. Less confluence of bilateral perihilar opacity. No pneumothorax, pleural effusion or air bronchograms. Paucity of bowel gas in the visible upper abdomen. No acute osseous abnormality identified. IMPRESSION: Larger lung volumes with ongoing course bilateral pulmonary interstitial opacity. Bilateral perihilar opacity is less confluent. Top differential considerations are acute viral/atypical respiratory infection versus pulmonary edema. No pleural effusion is evident. Electronically Signed   By: Genevie Ann M.D.   On: 07/20/2021 09:09   DG CHEST PORT 1 VIEW  Result Date: 07/18/2021 CLINICAL DATA:  Shortness of breath. EXAM: PORTABLE CHEST 1 VIEW COMPARISON:  April 03, 2020 FINDINGS: Moderate to marked severity diffusely increased interstitial lung markings are seen, slightly more prominent within the perihilar regions,  bilaterally. There is no evidence of a pleural effusion or pneumothorax. The heart size and mediastinal contours are within normal limits. The visualized skeletal structures are unremarkable. IMPRESSION: Moderate to marked severity pulmonary edema. Electronically Signed   By: Virgina Norfolk M.D.   On: 07/18/2021 21:09   DG C-Arm 1-60 Min-No Report  Result Date: 07/01/2021  Fluoroscopy was utilized by the requesting physician.  No radiographic interpretation.   ECHOCARDIOGRAM COMPLETE  Result Date: 07/03/2021    ECHOCARDIOGRAM REPORT   Patient Name:   Joseph [name]. Date of Exam: 07/03/2021 Medical Rec #:  [id number]        Height:       73.0 in Accession #:    [id number]       Weight:       480.6 lb Date of Birth:  16-Dec-1955        BSA:          3.123 m Patient Age:    41 years         BP:           130/51 mmHg Patient Gender: M                HR:           39 bpm. Exam Location:  Inpatient Procedure: 2D Echo, Color Doppler, Cardiac Doppler and Intracardiac            Opacification Agent Indications:    Bradycardia  History:        Patient has prior history of Echocardiogram examinations, most                 recent 01/03/2003. Risk Factors:Hypertension, Diabetes and                 Dyslipidemia.  Sonographer:    Maudry Mayhew MHA, RDMS, RVT, RDCS Referring Phys: [id number] PAULA V ROSS  Sonographer Comments: Technically challenging study due to limited acoustic windows, suboptimal parasternal window, suboptimal apical window, suboptimal subcostal window and patient is morbidly obese. IMPRESSIONS  1. Left ventricular ejection fraction, by estimation, is 60 to 65%. The left ventricle has normal function. Left ventricular endocardial border not optimally defined to evaluate regional wall motion. Left ventricular diastolic parameters are indeterminate.  2. Right ventricular systolic function is normal. The right ventricular size is normal.  3. The mitral valve was not well visualized. No evidence of mitral  valve regurgitation. No evidence of mitral stenosis.  4. The aortic valve was not well visualized. Aortic valve regurgitation is not visualized. No aortic stenosis is present.  5. The inferior vena cava is normal in size with greater than 50% respiratory variability, suggesting right atrial pressure of 3 mmHg. FINDINGS  Left Ventricle: Left ventricular ejection fraction, by estimation, is 60 to 65%. The left ventricle has normal function. Left ventricular endocardial border not optimally defined to evaluate regional wall motion. The left ventricular internal cavity size was normal in size. There is no left ventricular hypertrophy. Left ventricular diastolic parameters are indeterminate. Right Ventricle: The right ventricular size is normal. No increase in right ventricular wall thickness. Right ventricular systolic function is normal. Left Atrium: Left atrial size was normal in size. Right Atrium: Right atrial size was normal in size. Pericardium: There is no evidence of pericardial effusion. Presence of pericardial fat pad. Mitral Valve: The mitral valve was not well visualized. Mild mitral annular calcification. No evidence of mitral valve regurgitation. No evidence of mitral valve stenosis. Tricuspid Valve: The tricuspid valve is not well visualized. Tricuspid valve regurgitation is not demonstrated. No evidence of tricuspid stenosis. Aortic Valve: The aortic valve was not well visualized. Aortic valve regurgitation is not visualized. No aortic stenosis is present. Aortic valve mean gradient measures 2.0 mmHg. Aortic valve peak gradient measures 3.8 mmHg. Aortic valve area, by VTI measures 2.83 cm.  Pulmonic Valve: The pulmonic valve was normal in structure. Pulmonic valve regurgitation is not visualized. No evidence of pulmonic stenosis. Aorta: The aortic root is normal in size and structure. Venous: The inferior vena cava is normal in size with greater than 50% respiratory variability, suggesting right atrial  pressure of 3 mmHg. IAS/Shunts: No atrial level shunt detected by color flow Doppler.  LEFT VENTRICLE PLAX 2D LVOT diam:     1.90 cm LV SV:         55 LV SV Index:   18 LVOT Area:     2.84 cm  RIGHT VENTRICLE RV S prime:     12.80 cm/s LEFT ATRIUM           Index LA Vol (A4C): 22.7 ml 7.27 ml/m  AORTIC VALVE AV Area (Vmax):    2.89 cm AV Area (Vmean):   2.85 cm AV Area (VTI):     2.83 cm AV Vmax:           97.40 cm/s AV Vmean:          71.400 cm/s AV VTI:            0.195 m AV Peak Grad:      3.8 mmHg AV Mean Grad:      2.0 mmHg LVOT Vmax:         99.35 cm/s LVOT Vmean:        71.800 cm/s LVOT VTI:          0.194 m LVOT/AV VTI ratio: 1.00  AORTA Ao Root diam: 2.70 cm MR Peak grad: 9.1 mmHg MR Vmax:      151.00 cm/s SHUNTS                           Systemic VTI:  0.19 m                           Systemic Diam: 1.90 cm Candee Furbish MD Electronically signed by Candee Furbish MD Signature Date/Time: 07/03/2021/11:03:35 AM    Final     Microbiology: No results found for this or any previous visit (from the past 240 hour(s)).   Labs: Basic Metabolic Panel: Recent Labs  Lab 07/25/21 0546 07/26/21 0630 07/27/21 0320 07/28/21 0624 07/29/21 0407  NA 135 134* 134* 135 134*  K 4.2 4.0 4.2 3.6 3.8  CL 101 100 99 100 100  CO2 24 25 26 29 28   GLUCOSE 118* 134* 150* 93 63*  BUN 120* 82* 91* 57* 68*  CREATININE 2.92* 2.81* 3.32* 2.70* 3.44*  CALCIUM 8.6* 8.9 8.7* 8.8* 8.8*  PHOS 4.6 4.5 5.3* 3.8 4.2   Liver Function Tests: Recent Labs  Lab 07/25/21 0546 07/26/21 0630 07/27/21 0320 07/28/21 0624 07/29/21 0407  ALBUMIN 2.6* 2.5* 2.4* 2.3* 2.3*   No results for input(s): LIPASE, AMYLASE in the last 168 hours. No results for input(s): AMMONIA in the last 168 hours. CBC: Recent Labs  Lab 07/25/21 0546 07/26/21 0630 07/27/21 0320 07/29/21 0407  WBC 7.0 7.5 7.7 7.1  NEUTROABS  --   --   --  4.2  HGB 7.3* 7.8* 8.3* 7.6*  HCT 24.9* 27.5* 28.7* 25.9*  MCV 94.0 93.5 93.8 94.5  PLT 498* 528* 461*  390   Cardiac Enzymes: No results for input(s): CKTOTAL, CKMB, CKMBINDEX, TROPONINI in the last 168 hours. BNP: BNP (last 3 results) Recent Labs    07/18/21 0951  BNP  196.5*    ProBNP (last 3 results) No results for input(s): PROBNP in the last 8760 hours.  CBG: Recent Labs  Lab 07/30/21 1256 07/30/21 1611 07/30/21 2006 07/31/21 0009 07/31/21 0339  GLUCAP 212* 164* 142* 162* 163*       Signed:  Erin Hearing ANP [facility] 07/31/2021, 7:54 AM

## 2021-07-30 NOTE — Progress Notes (Addendum)
[place] [facility]) Hospital Liaison Note  Received request from Transitions of Care Manager for hospice services at home after discharge. Chart and patient information under review by [facility] physician. Hospice eligibility is approved.   Spoke with family/patient to initiate education related to hospice philosophy, services, and team approach to care. Patient/family verbalized understanding of information given. Per discussion, the plan is for patient to discharge home via PTAR once medically cleared.   DME needs discussed. Patient has the following equipment in the home: bedside table Patient requests the following equipment for delivery: 02 @ 6L, mattress, lift, Bariatric HB. Address verified and is correct in the chart. Leondro Coryell, wife, [phone], is the family member to contact to arrange time of equipment delivery.    Please send signed and completed DNR home with patient/family. Please provide prescriptions at discharge as needed to ensure ongoing symptom management.    [facility] information and contact numbers given to Ranjit Ashurst, wife, [phone]. Above information shared with TOC.    Please call with any questions/concerns.    Thank you for the opportunity to participate in this patient's care  Addendum  4:05 pm-- Patient scheduled to discharge tomorrow via PT[facility] at 11am. DME will be delivered tomorrow @ 10 am to the home. Family and TOC aware of this update. No additional questions or concerns shared.  Please send a signed and completed DNR, prescriptions for comfort medications and a transport packet.    Daphene Calamity, MSW [facility] Liaison  [phone]

## 2021-07-30 NOTE — Progress Notes (Signed)
Patient on home CPAP resting well.  RT will continue to monitor.

## 2021-07-30 NOTE — Progress Notes (Signed)
[facility] PROGRESS NOTE  Joseph [name]. BTD:[id number] DOB: Sep 28, 1955 DOA: 06/11/2021 PCP: Donald Prose, MD  Status: Remains inpatient appropriate because:  Unsafe discharge plan.  Has transition to comfort care and outpatient services/DME assess of being arranged for  Medically stable for discharge:  Yes  Barriers to discharge:  Social: Patient prefers to discharge to his home to die but he does not have enough regular assistance from family to likely make this possible-he attempting to obtain appropriate resources to discharge patient home with hospice  Clinical: Morbid obesity limiting inpatient hospice placement options but this is still being investigated.  Level of care:  Progressive   Code Status: DNR Family Communication: No family at bedside today. DVT prophylaxis: Coumadin/IV heparin bridging COVID vaccination status: Unknown    HPI: 65 y.o. male with PMH significant for severe morbid obesity, OSA on CPAP, DM2, HTN, HLD, PAD, DVT on Coumadin, GERD, anxiety/depression, polyneuropathy, multiple falls who presented to the ED on 06/11/21 after fall at home resulting in left knee pain.  He was found to have left knee dislocation and fracture fragments with associated left knee effusion.  There were concern for possible left knee hematoma in setting of supratherapeutic INR.  Seen by orthopedic surgery, patient underwent closed reduction of left knee dislocation but no surgical management was planned because of patient's body habitus.  Patient is scheduled for a I&D on 07/08/2021 by Dr. Sharol Given.   Subjective: Upcoming discharge plans.  Patient and wife both report pure wick device has been the only effective device to maintain collection of urine without contact of the skin.  Due to his anatomy unfortunately he is not a candidate for Foley catheter placement unless this is done by her urologist.  In addition, reviewed discharge medications with the patient and he wishes  to continue his Lantus insulin as well as sliding scale insulin stating he feels bad when his sugar gets too high.  He has a continuous glucose monitoring device that he uses at home.  Objective: Vitals:   07/29/21 2005 07/30/21 0736  BP: (!) 140/35 (!) 132/29  Pulse: 75 80  Resp: 13 14  Temp: 99.2 F (37.3 C) 98.5 F (36.9 C)  SpO2: 96% 99%    Intake/Output Summary (Last 24 hours) at 07/30/2021 0740 Last data filed at 07/30/2021 0600 Gross per 24 hour  Intake 118.41 ml  Output 250 ml  Net -131.59 ml   Filed Weights   07/25/21 0717 07/27/21 1236 07/27/21 1625  Weight: 107.7 kg (!) 228 kg (!) 224.9 kg    Exam:  Constitutional: NAD, calm trouble Respiratory: CTA bilaterally, Normal respiratory effort.  CPAP in place, pulse oximetry 100% Cardiovascular.  S1-S2, sinus rhythm, normotensive continues with acute on chronic lower extremity edema with brawny and hemosiderin skin changes below the knees Abdomen: no tenderness,  Bowel sounds positive. LBM 11/02 Musculoskeletal: External fixation device on left lower extremity Neurologic: CN 2-12 grossly intact. Sensation intact, Strength 3/5 x all 4 extremities.  Psychiatric: Normal judgment and insight. Alert and oriented x 3.  Flat mood.    Assessment/Plan: Acute problems: Left knee dislocation after fall.  Status post left knee reduction external fixation with evacuation Left knee hematoma -X-ray w/ L knee dislocation and several fracture w/fragments/hematoma.  Initially treated with conservative nonoperative management but follow-up MRI revealed significant ligamentous injury with patellar dislocation that required surgical repair including application of external fixator device -Continue OxyContin 15 mg BID while hospitalized.  Will increase to 20 mg BID upon  discharge; continue Oxy IR for breakthrough pain; continue scheduled Robaxin twice daily as well as as needed dosing for breakthrough muscle spasm -Wife states that initial  injury occurred after falling in shower at home with patient stating that his leg "just gave way"; he had an additional fall after arrival to the hospital    06/11/2021                  06/18/2021     07/14/2021                 07/14/2021     History of pulmonary embolism/chronic anticoagulation with warfarin Comfort care so no longer on anticoagulation   Recurrent AKI on CKD  with recurrent hyperkalemia Comfort care and no longer on hemodialysis Unclear if will remove [facility] or leave in place that he requires EOL IV medications for comfort  EOL/goals of care Has now transition to comfort measures with discharge disposition Home with family  Chronic peripheral edema/diastolic heart failure Continue low-dose Lasix after discharge for comfort in the event of respiratory difficulty from volume overload  Depression /anxiety -Continue Prozac  Profound physical deconditioning Addition to comfort care.   Type 2 diabetes mellitus Diabetic neuropathy -Have discontinued Actos and metformin in favor of long-acting insulin with sliding scale insulin after discharge -Patient wishes to continue insulin stating that he feels poorly when his sugar is too high   Essential hypertension Preadmission Norvasc and lisinopril discontinued optimal blood pressure readings and patient is now on comfort care   Hyperlipidemia Pravastatin discontinued can [place] to transition to comfort care   Chronic normocytic anemia Baseline hemoglobin ~10.   S/p of 2 units of PRBCs and 1 unit of FFP.   Comfort measures so will not check labs any further   Transient bradycardia/ 1st degree heart block/frequent PVCs No additional work-up noting patient is now on comfort measures  Severe morbid obesity  -Body mass index is 65.43 kg/m.  -Given patient's size as well as large pannus and inverted penis the only method of collecting urine to prevent incontinence, and skin exposure that leads to maceration has been the  pure wick device.  Multiple devices tried during the hospitalization but were unsuccessful in preventing ER into skin contact.  Due to patient's penile anatomy he is not a candidate for a Foley catheter   GERD -Family instructed to utilize previous Prilosec as needed for indigestion   OSA, compliant with CPAP -Tolerating CPAP here and will continue after discharge as prior to admission   Constipation, opiate induced -Continue Colace, Senokot, Miralax -Dulcolax suppository prn   Pressure injury -Mid-nose, not present on admission. Secondary to CPAP mask -Continue foam dressing over bridge of nose for comfort     Data Reviewed: Basic Metabolic Panel: Recent Labs  Lab 07/25/21 0546 07/26/21 0630 07/27/21 0320 07/28/21 0624 07/29/21 0407  NA 135 134* 134* 135 134*  K 4.2 4.0 4.2 3.6 3.8  CL 101 100 99 100 100  CO2 24 25 26 29 28   GLUCOSE 118* 134* 150* 93 63*  BUN 120* 82* 91* 57* 68*  CREATININE 2.92* 2.81* 3.32* 2.70* 3.44*  CALCIUM 8.6* 8.9 8.7* 8.8* 8.8*  PHOS 4.6 4.5 5.3* 3.8 4.2   Liver Function Tests: Recent Labs  Lab 07/25/21 0546 07/26/21 0630 07/27/21 0320 07/28/21 0624 07/29/21 0407  ALBUMIN 2.6* 2.5* 2.4* 2.3* 2.3*    CBC: Recent Labs  Lab 07/25/21 0546 07/26/21 0630 07/27/21 0320 07/29/21 0407  WBC 7.0 7.5 7.7 7.1  NEUTROABS  --   --   --  4.2  HGB 7.3* 7.8* 8.3* 7.6*  HCT 24.9* 27.5* 28.7* 25.9*  MCV 94.0 93.5 93.8 94.5  PLT 498* 528* 461* 390   CBG: Recent Labs  Lab 07/29/21 0728 07/29/21 0827 07/29/21 1236 07/29/21 2101 07/30/21 0735  GLUCAP 50* 73 141* 195* 174*      Scheduled Meds:  docusate sodium  100 mg Oral Daily   FLUoxetine  60 mg Oral QPM   furosemide  80 mg Intravenous Q12H   methocarbamol  1,000 mg Oral BID   oxyCODONE  15 mg Oral Q12H   polyethylene glycol  17 g Oral BID   senna  1 tablet Oral Daily   Continuous Infusions:    Principal Problem:   Left knee dislocation, initial encounter Active  Problems:   Pure hypercholesterolemia   Essential hypertension, benign   OSA (obstructive sleep apnea)   Hx pulmonary embolism   Esophageal reflux   Diabetes mellitus type 2, uncontrolled   Diabetic neuropathy (HCC)   Acute renal failure superimposed on stage 3b chronic kidney disease (HCC)   Normocytic anemia   Class 3 obesity (HCC)   Aortic atherosclerosis (HCC)   Coronary atherosclerosis   Effusion of left knee   Left knee dislocation   Pressure injury of skin   Unstageable pressure ulcer of sacral region ([place])   At risk for hemorrhage associated with anticoagulation therapy   Ulcer of left knee, with necrosis of bone (HCC)   Open knee wound, left, subsequent encounter   Moderate episode of recurrent major depressive disorder ([place])   Goals of care, counseling/discussion   Consultants: Orthopedics Cardiology  Procedures: Echocardiogram Left knee reduction with external fixation and evacuation of hematoma Left thigh debridement on 10/12 and again on 10/14  Antibiotics: Cephalexin 9/15 through 10/4 Cefazolin 10/5 >>   Time spent: 25 minutes    Erin Hearing ANP  [facility] 7 am - 330 pm/M-F for direct patient care and secure chat Please refer to Amion for contact info 48  days

## 2021-07-30 NOTE — Progress Notes (Addendum)
CSW spoke with [facility] liaison Lorayne Bender to discuss patient. Lorayne Bender states at this time, there are no TOC needs. Lorayne Bender states DME will be delivered at some point today, if not early tomorrow morning.   Once DME is delivered and discharge orders are in, patient can be transported home via [place].   Madilyn Fireman, MSW, LCSW Transitions of Care  Clinical Social Worker II [phone]

## 2021-07-30 NOTE — Progress Notes (Signed)
Daily Progress Note   Patient Name: Joseph [name].       Date: 07/30/2021 DOB: [birth year]  Age: 65 y.o. MRN#: [id number] Attending Physician: Terrilee Croak, MD Primary Care Physician: Donald Prose, MD Admit Date: 06/11/2021  Reason for Consultation/Follow-up: Establishing goals of care  Patient Profile/HPI:  Palliative Care consult requested on hospital day 42 for symptom management and goals of care discussion in this 65 y.o. male  with past medical history of morbid obesity, falls, sleep apnea (CPAP), type 2 diabetes, hyperlipidemia, PAD, hypertension, DVT (Coumadin), anxiety, depression, and GERD.  He was admitted on 06/11/2021 from home with s/p fall resulting in knee dislocation.  Hospital course complicated by closed reduction of left knee dislocation, significant ligamentous injury and lateral patellar dislocation s/p extensive debridement, reduction of dislocation, external fixation with wound VAC to chronic left thigh wound, respiratory failure, acute on chronic kidney failure requiring short-term dialysis. Joseph [name] expressed his feelings that dialysis was not improving his quality of life and decision was made to stop dialysis and discharge home with hospice.   Subjective: Joseph [name] is resting comfortably.  Beth had questions regarding care for urinary needs at home- requesting a purewick system.  We also discussed other symptom management needs.  Currently they are awaiting delivery of home equipment.   Physical Exam Vitals and nursing note reviewed.  Constitutional:      Appearance: He is obese.  Cardiovascular:     Rate and Rhythm: Normal rate.  Pulmonary:     Comments: On cpap Neurological:     Mental Status: He is alert.            Vital Signs: BP (!) 132/29 (BP Location: Left  Wrist)   Pulse 80   Temp 98.5 F (36.9 C) (Axillary)   Resp 14   Ht 6\' 1"  (1.854 m)   Wt (!) 224.9 kg   SpO2 99%   BMI 65.41 kg/m  SpO2: SpO2: 99 % O2 Device: O2 Device: CPAP O2 Flow Rate: O2 Flow Rate (L/min): 15 L/min  Intake/output summary:  Intake/Output Summary (Last 24 hours) at 07/30/2021 1347 Last data filed at 07/30/2021 0600 Gross per 24 hour  Intake 118.41 ml  Output 250 ml  Net -131.59 ml   LBM: Last BM Date: 07/29/21 Baseline Weight: Weight: (!) 208.7 kg Most recent  weight: Weight: (!) 224.9 kg     Palliative Care Assessment & Plan    Assessment/Recommendations/Plan  Plan for discharge home with Hospice Please ensure patient is discharged with pain, anxiety and other comfort medications that are currently ordered  Code Status: DNR  Prognosis:  < 4 weeks  Discharge Planning: Home with Hospice  Care plan was discussed with patient's spouse, and care team.   Thank you for allowing the Palliative Medicine Team to assist in the care of this patient.  Total time:  40 minutes Prolonged billing: No     Greater than 50%  of this time was spent counseling and coordinating care related to the above assessment and plan.  Mariana Kaufman, AGNP-C Palliative Medicine   Please contact Palliative Medicine Team phone at [phone] for questions and concerns.

## 2021-07-30 NOTE — Progress Notes (Signed)
I did wound care on patients left leg twice today. Both times he needed dilaudid. Family is aware that they only give oral meds for hospice patients who are alert. Wound on leg does not look like it is healing. Patient still wants sugar checks and insulin for meal coverage. Patient will be discharged once family gets all the equipment.

## 2021-07-31 LAB — GLUCOSE, CAPILLARY
Glucose-Capillary: 146 mg/dL — ABNORMAL HIGH (ref 70–99)
Glucose-Capillary: 162 mg/dL — ABNORMAL HIGH (ref 70–99)
Glucose-Capillary: 163 mg/dL — ABNORMAL HIGH (ref 70–99)

## 2021-07-31 NOTE — Progress Notes (Signed)
[facility]) Hospital Liaison Note  MSW met with patient and family at bedside this AM. Equipment has been delivered and [facility] to provide transport (scheduled for 11 am by [facility] staff). No additional questions or concerns shared with MSW at this time and family voiced appreciation for [facility] support.   Please send signed and completed DNR home with patient/family. Please provide prescriptions at discharge as needed to ensure ongoing symptom management and a transport packet. [facility] information and contact numbers given to Tareq Dwan, wife, [phone]. Above information shared with TOC.    Please call with any questions/concerns.    Thank you for the opportunity to participate in this patient's care   Daphene Calamity, MSW [facility] Liaison  [phone]

## 2021-07-31 NOTE — Progress Notes (Addendum)
CSW received message from [place] who states PTAR has been arranged for pickup at 11am. CSW left medical necessity, discharge summary, and DNR with Lu Duffel, Therapist, sports.  Madilyn Fireman, MSW, LCSW Transitions of Care  Clinical Social Worker II [phone]

## 2021-08-04 NOTE — Telephone Encounter (Signed)
I spoke with the pts wife and she reports that the pt is home on Hospice and she is not needing any further cardiac care or follow up.   Will close this encounter... I offered my apologies for the call and she was thankful for the follow up.

## 2021-08-05 ENCOUNTER — Ambulatory Visit: Payer: HMO | Admitting: Physician Assistant

## 2021-08-27 DEATH — deceased

## 2022-03-05 IMAGING — DX DG TOE GREAT 2+V*R*
3 series · 3 of 3 positions shown · non-contrast
Comparison: 02/15/2020.

CLINICAL DATA: Nonhealing wound.

EXAM:
RIGHT GREAT TOE

[toe ap]
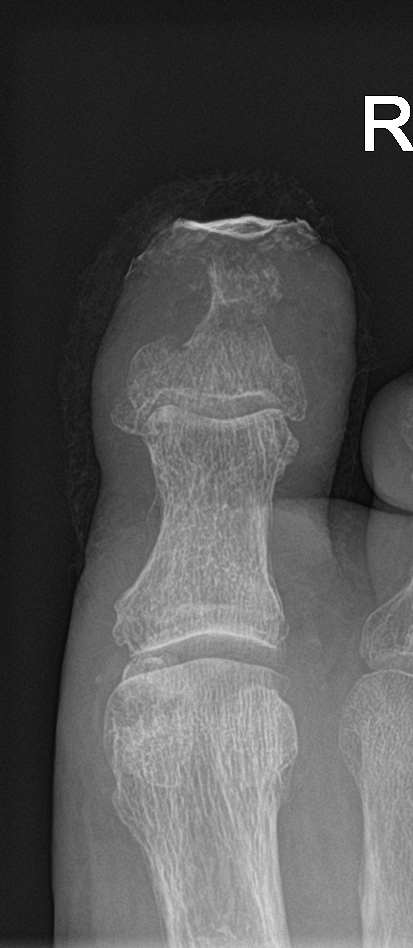

[toe obl]
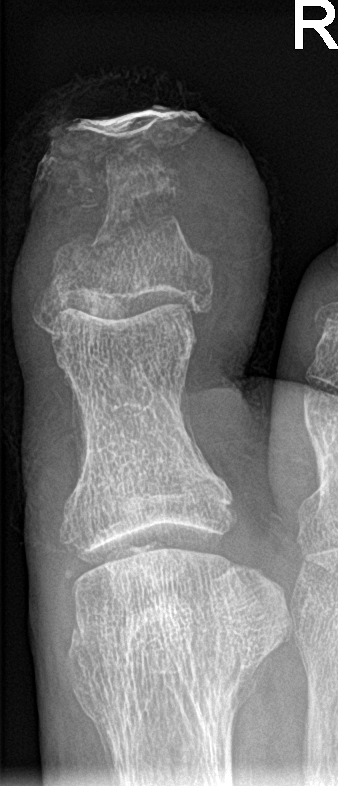

[toe lat]
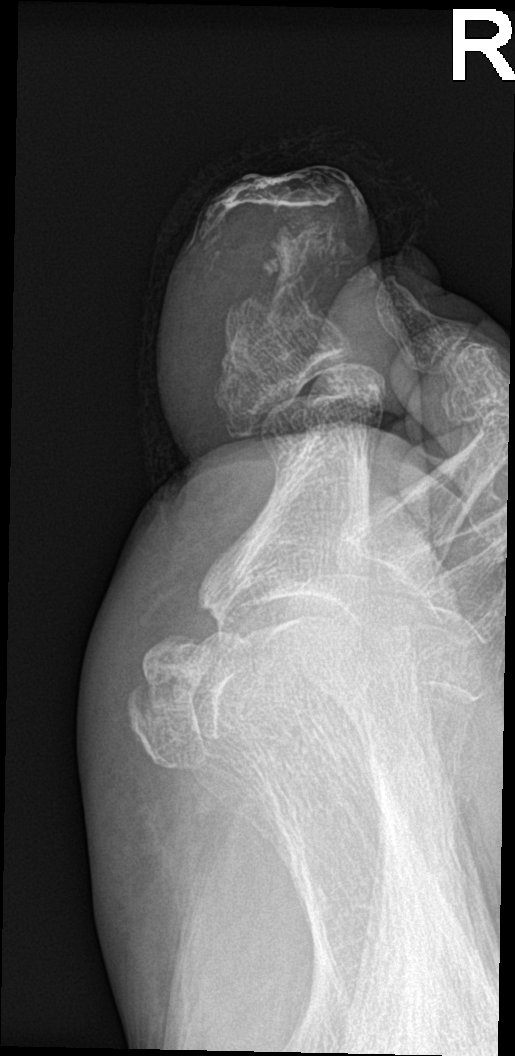

[3 of 3 positions shown; findings below may reference images not displayed]

FINDINGS: Soft tissue ulceration noted of the distal aspect of the right great
toe. Underlying bony erosion of the distal aspect of the distal
phalanx of the right great toe consistent with osteomyelitis.
Diffuse degenerative change.
IMPRESSION: Findings consistent with osteomyelitis of the distal aspect of the
distal phalanx of the right great toe.

## 2022-03-15 IMAGING — CR DG CHEST 2V
2 series · 2 of 2 positions shown · non-contrast
Comparison: 01/03/2015

CLINICAL DATA: Preop for surgery

EXAM:
CHEST - 2 VIEW

[w chest pa]
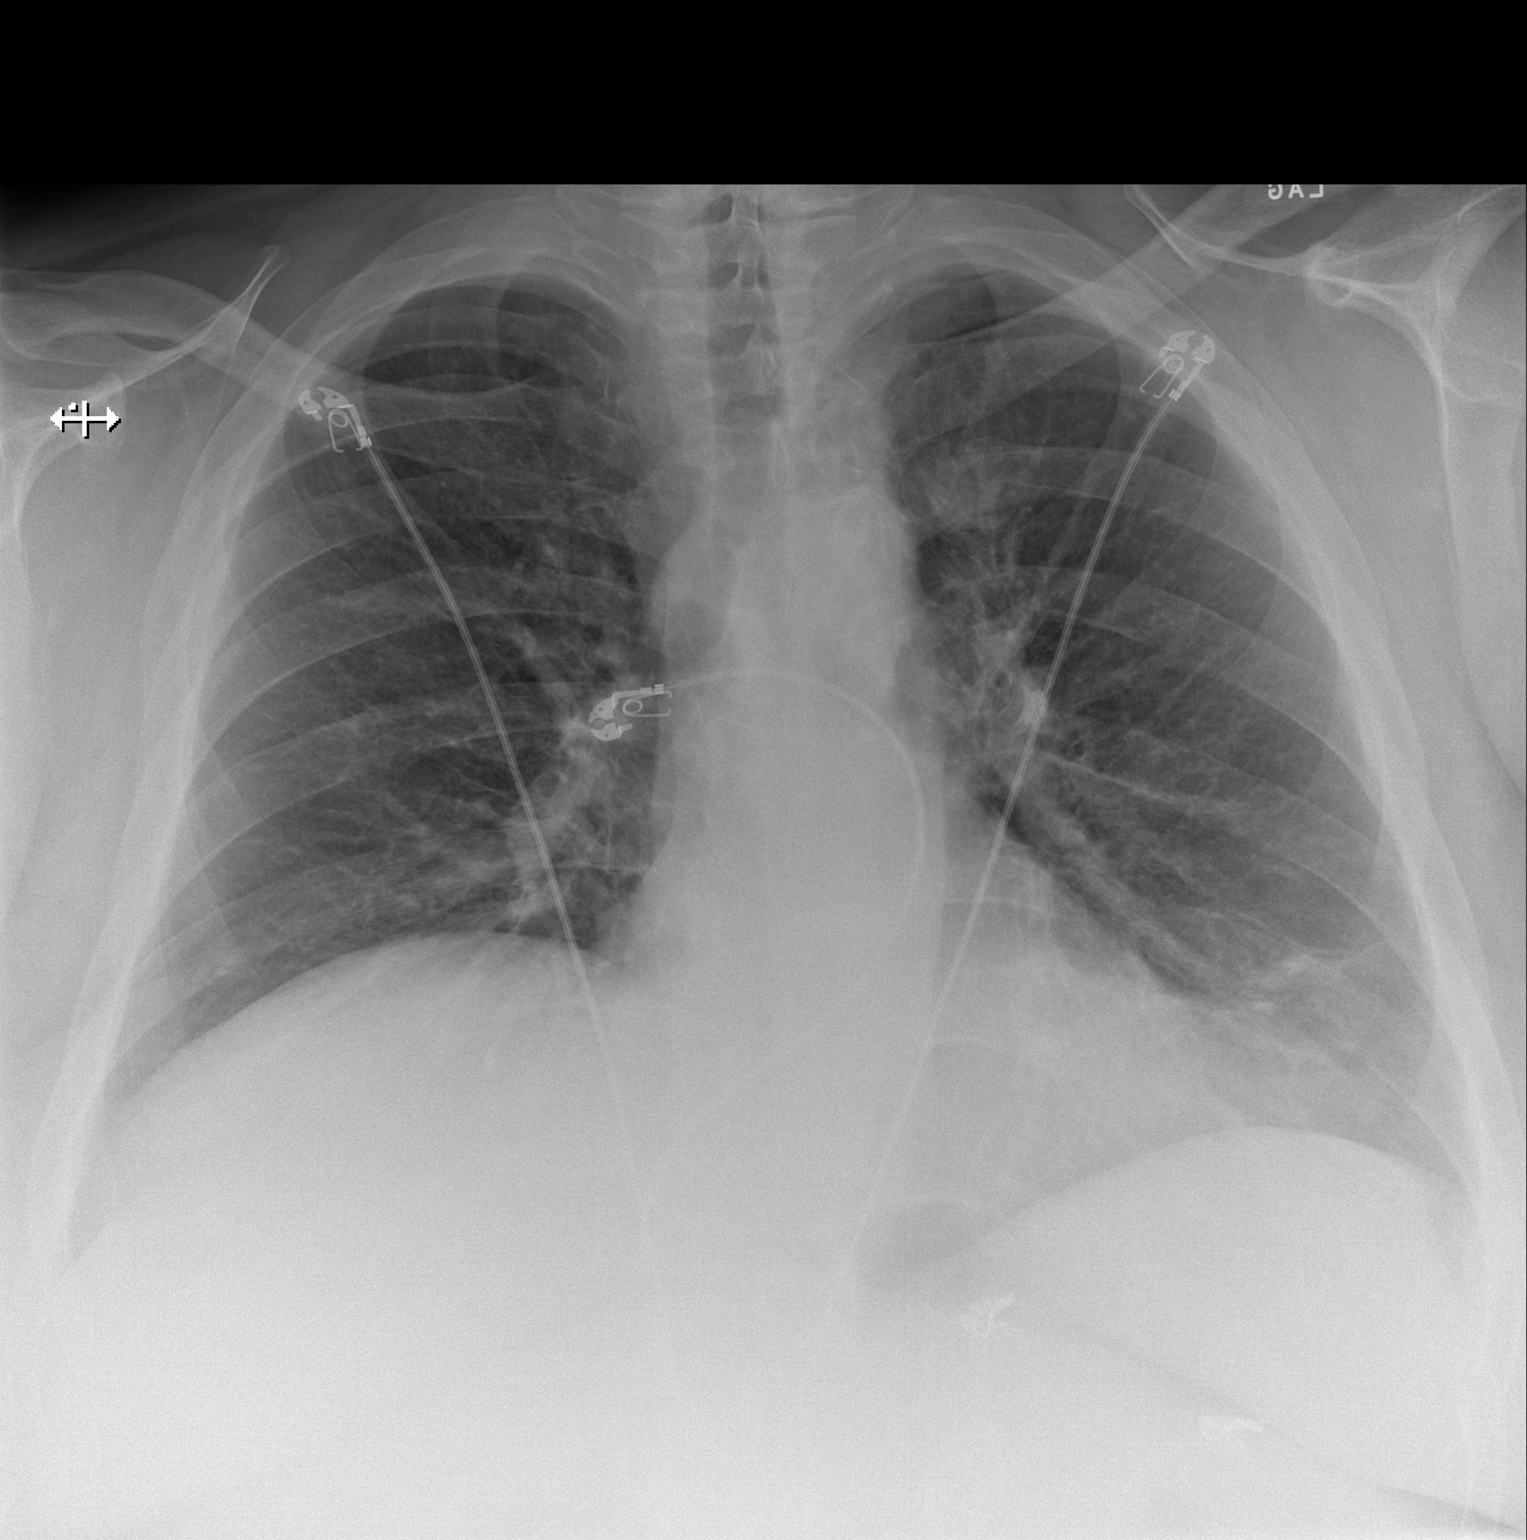

[w chest lat]
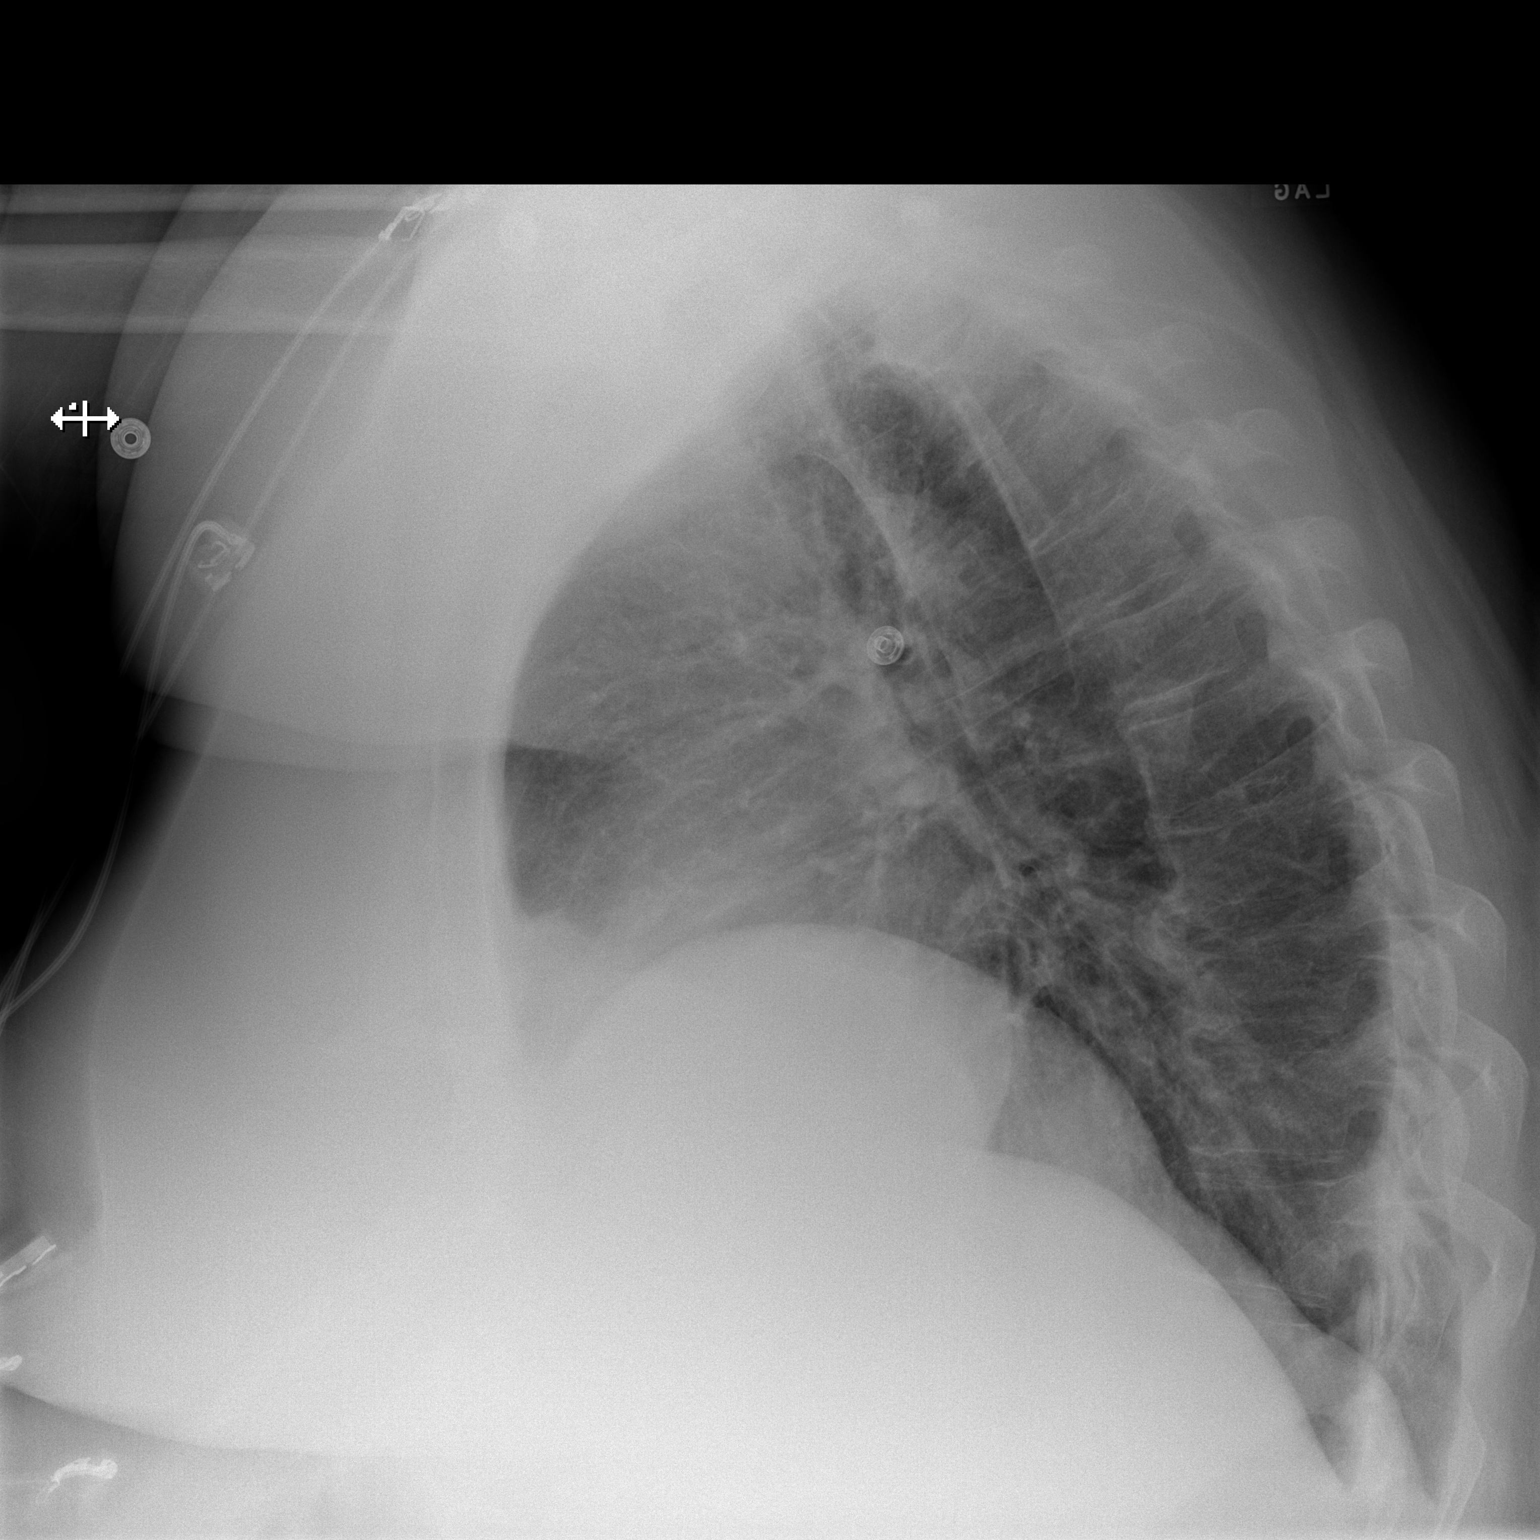

[2 of 2 positions shown; findings below may reference images not displayed]

FINDINGS: Low volume chest similar to prior, with eventration appearance of
the right diaphragm. Mild streaky scarring at the left base.
Aeration is improved from before. No edema, effusion, or
pneumothorax.
IMPRESSION: No evidence of active disease.
